# Patient Record
Sex: Female | Born: 1965
Health system: Southern US, Community
[De-identification: ages and names within clinical notes are randomized; demographics above are authoritative.]

## PROBLEM LIST (undated history)

## (undated) DIAGNOSIS — D649 Anemia, unspecified: Secondary | ICD-10-CM

## (undated) DIAGNOSIS — C50912 Malignant neoplasm of unspecified site of left female breast: Secondary | ICD-10-CM

## (undated) DIAGNOSIS — M3214 Glomerular disease in systemic lupus erythematosus: Secondary | ICD-10-CM

## (undated) DIAGNOSIS — K08109 Complete loss of teeth, unspecified cause, unspecified class: Secondary | ICD-10-CM

## (undated) DIAGNOSIS — E559 Vitamin D deficiency, unspecified: Secondary | ICD-10-CM

## (undated) DIAGNOSIS — I509 Heart failure, unspecified: Secondary | ICD-10-CM

## (undated) DIAGNOSIS — N189 Chronic kidney disease, unspecified: Secondary | ICD-10-CM

## (undated) DIAGNOSIS — E669 Obesity, unspecified: Secondary | ICD-10-CM

## (undated) DIAGNOSIS — R87619 Unspecified abnormal cytological findings in specimens from cervix uteri: Secondary | ICD-10-CM

## (undated) DIAGNOSIS — S92354A Nondisplaced fracture of fifth metatarsal bone, right foot, initial encounter for closed fracture: Secondary | ICD-10-CM

## (undated) DIAGNOSIS — Z923 Personal history of irradiation: Secondary | ICD-10-CM

## (undated) DIAGNOSIS — R809 Proteinuria, unspecified: Secondary | ICD-10-CM

## (undated) DIAGNOSIS — IMO0002 Reserved for concepts with insufficient information to code with codable children: Secondary | ICD-10-CM

## (undated) DIAGNOSIS — I10 Essential (primary) hypertension: Secondary | ICD-10-CM

## (undated) DIAGNOSIS — Z972 Presence of dental prosthetic device (complete) (partial): Secondary | ICD-10-CM

## (undated) DIAGNOSIS — Z9221 Personal history of antineoplastic chemotherapy: Secondary | ICD-10-CM

## (undated) HISTORY — DX: Proteinuria, unspecified: R80.9

## (undated) HISTORY — DX: Reserved for concepts with insufficient information to code with codable children: IMO0002

## (undated) HISTORY — DX: Complete loss of teeth, unspecified cause, unspecified class: K08.109

## (undated) HISTORY — DX: Unspecified abnormal cytological findings in specimens from cervix uteri: R87.619

## (undated) HISTORY — DX: Malignant neoplasm of unspecified site of left female breast: C50.912

## (undated) HISTORY — DX: Obesity, unspecified: E66.9

## (undated) HISTORY — DX: Essential (primary) hypertension: I10

## (undated) HISTORY — DX: Heart failure, unspecified: I50.9

## (undated) HISTORY — DX: Glomerular disease in systemic lupus erythematosus: M32.14

## (undated) HISTORY — DX: Anemia, unspecified: D64.9

## (undated) HISTORY — DX: Presence of dental prosthetic device (complete) (partial): Z97.2

## (undated) HISTORY — DX: Chronic kidney disease, unspecified: N18.9

## (undated) HISTORY — DX: Vitamin D deficiency, unspecified: E55.9

## (undated) HISTORY — DX: Nondisplaced fracture of fifth metatarsal bone, right foot, initial encounter for closed fracture: S92.354A

## (undated) SURGERY — BRONCHOSCOPY, WITH FLUOROSCOPY
Anesthesia: Moderate Sedation | Laterality: Bilateral

---

## 1985-10-19 HISTORY — PX: ANKLE SURGERY: SHX546

## 1998-10-19 HISTORY — PX: TUBAL LIGATION: SHX77

## 2006-10-14 ENCOUNTER — Emergency Department: Payer: Self-pay | Admitting: Emergency Medicine

## 2007-10-20 HISTORY — PX: MASTECTOMY: SHX3

## 2007-12-18 DIAGNOSIS — C50912 Malignant neoplasm of unspecified site of left female breast: Secondary | ICD-10-CM

## 2007-12-18 HISTORY — DX: Malignant neoplasm of unspecified site of left female breast: C50.912

## 2007-12-19 ENCOUNTER — Encounter (INDEPENDENT_AMBULATORY_CARE_PROVIDER_SITE_OTHER): Payer: Self-pay | Admitting: Gynecology

## 2007-12-19 ENCOUNTER — Ambulatory Visit: Payer: Self-pay | Admitting: Gynecology

## 2007-12-23 ENCOUNTER — Encounter (INDEPENDENT_AMBULATORY_CARE_PROVIDER_SITE_OTHER): Payer: Self-pay | Admitting: Diagnostic Radiology

## 2007-12-23 ENCOUNTER — Encounter: Admission: RE | Admit: 2007-12-23 | Discharge: 2007-12-23 | Payer: Self-pay | Admitting: Gynecology

## 2007-12-30 ENCOUNTER — Ambulatory Visit: Payer: Self-pay | Admitting: Oncology

## 2008-01-02 ENCOUNTER — Encounter: Admission: RE | Admit: 2008-01-02 | Discharge: 2008-01-02 | Payer: Self-pay | Admitting: Gynecology

## 2008-01-06 ENCOUNTER — Encounter: Admission: RE | Admit: 2008-01-06 | Discharge: 2008-01-06 | Payer: Self-pay | Admitting: Gynecology

## 2008-01-12 ENCOUNTER — Ambulatory Visit (HOSPITAL_COMMUNITY): Admission: RE | Admit: 2008-01-12 | Discharge: 2008-01-12 | Payer: Self-pay | Admitting: Oncology

## 2008-01-17 ENCOUNTER — Ambulatory Visit (HOSPITAL_COMMUNITY): Admission: RE | Admit: 2008-01-17 | Discharge: 2008-01-17 | Payer: Self-pay | Admitting: Oncology

## 2008-01-19 ENCOUNTER — Ambulatory Visit (HOSPITAL_BASED_OUTPATIENT_CLINIC_OR_DEPARTMENT_OTHER): Admission: RE | Admit: 2008-01-19 | Discharge: 2008-01-19 | Payer: Self-pay | Admitting: General Surgery

## 2008-01-21 LAB — HIV ANTIBODY (ROUTINE TESTING W REFLEX)

## 2008-02-07 ENCOUNTER — Encounter: Payer: Self-pay | Admitting: Oncology

## 2008-02-07 ENCOUNTER — Ambulatory Visit: Payer: Self-pay

## 2008-02-10 ENCOUNTER — Ambulatory Visit: Payer: Self-pay | Admitting: Oncology

## 2008-02-10 LAB — CBC WITH DIFFERENTIAL/PLATELET
Basophils Absolute: 0 10*3/uL (ref 0.0–0.1)
Eosinophils Absolute: 0 10*3/uL (ref 0.0–0.5)
HCT: 25.4 % — ABNORMAL LOW (ref 34.8–46.6)
HGB: 8.3 g/dL — ABNORMAL LOW (ref 11.6–15.9)
MONO#: 0.3 10*3/uL (ref 0.1–0.9)
NEUT%: 93.6 % — ABNORMAL HIGH (ref 39.6–76.8)
WBC: 11 10*3/uL — ABNORMAL HIGH (ref 3.9–10.0)
lymph#: 0.4 10*3/uL — ABNORMAL LOW (ref 0.9–3.3)

## 2008-02-17 LAB — CBC WITH DIFFERENTIAL/PLATELET
Basophils Absolute: 0.3 10*3/uL — ABNORMAL HIGH (ref 0.0–0.1)
HCT: 23.9 % — ABNORMAL LOW (ref 34.8–46.6)
HGB: 7.8 g/dL — ABNORMAL LOW (ref 11.6–15.9)
MCH: 22.4 pg — ABNORMAL LOW (ref 26.0–34.0)
MONO#: 0.8 10*3/uL (ref 0.1–0.9)
NEUT%: 70.2 % (ref 39.6–76.8)
WBC: 6.9 10*3/uL (ref 3.9–10.0)
lymph#: 0.9 10*3/uL (ref 0.9–3.3)

## 2008-02-24 LAB — CBC WITH DIFFERENTIAL/PLATELET
Basophils Absolute: 0 10*3/uL (ref 0.0–0.1)
Eosinophils Absolute: 0 10*3/uL (ref 0.0–0.5)
HCT: 23.1 % — ABNORMAL LOW (ref 34.8–46.6)
HGB: 7.8 g/dL — ABNORMAL LOW (ref 11.6–15.9)
LYMPH%: 14.1 % (ref 14.0–48.0)
MONO#: 0.4 10*3/uL (ref 0.1–0.9)
NEUT#: 6.1 10*3/uL (ref 1.5–6.5)
Platelets: 186 10*3/uL (ref 145–400)
RBC: 3.24 10*6/uL — ABNORMAL LOW (ref 3.70–5.32)
WBC: 7.5 10*3/uL (ref 3.9–10.0)

## 2008-03-02 LAB — CBC WITH DIFFERENTIAL/PLATELET
BASO%: 0.1 % (ref 0.0–2.0)
Eosinophils Absolute: 0 10*3/uL (ref 0.0–0.5)
HCT: 26.3 % — ABNORMAL LOW (ref 34.8–46.6)
LYMPH%: 4.2 % — ABNORMAL LOW (ref 14.0–48.0)
MCHC: 32.4 g/dL (ref 32.0–36.0)
MCV: 74.1 fL — ABNORMAL LOW (ref 81.0–101.0)
MONO#: 0.3 10*3/uL (ref 0.1–0.9)
NEUT%: 92.9 % — ABNORMAL HIGH (ref 39.6–76.8)
Platelets: 349 10*3/uL (ref 145–400)
WBC: 12.6 10*3/uL — ABNORMAL HIGH (ref 3.9–10.0)

## 2008-03-02 LAB — COMPREHENSIVE METABOLIC PANEL
CO2: 22 mEq/L (ref 19–32)
Creatinine, Ser: 0.79 mg/dL (ref 0.40–1.20)
Glucose, Bld: 149 mg/dL — ABNORMAL HIGH (ref 70–99)
Total Bilirubin: 0.4 mg/dL (ref 0.3–1.2)

## 2008-03-21 ENCOUNTER — Ambulatory Visit: Payer: Self-pay | Admitting: Oncology

## 2008-03-23 ENCOUNTER — Encounter: Payer: Self-pay | Admitting: Internal Medicine

## 2008-03-23 LAB — CBC WITH DIFFERENTIAL/PLATELET
Basophils Absolute: 0 10*3/uL (ref 0.0–0.1)
Eosinophils Absolute: 0 10*3/uL (ref 0.0–0.5)
LYMPH%: 7.2 % — ABNORMAL LOW (ref 14.0–48.0)
MCH: 26 pg (ref 26.0–34.0)
MCHC: 34.1 g/dL (ref 32.0–36.0)
MONO#: 0.2 10*3/uL (ref 0.1–0.9)
MONO%: 3.1 % (ref 0.0–13.0)
NEUT%: 89.7 % — ABNORMAL HIGH (ref 39.6–76.8)
RBC: 3.29 10*6/uL — ABNORMAL LOW (ref 3.70–5.32)
RDW: 26.5 % — ABNORMAL HIGH (ref 11.3–14.5)

## 2008-03-27 LAB — HEMOGLOBINOPATHY EVALUATION: Hemoglobin Other: 0 % (ref 0.0–0.0)

## 2008-03-27 LAB — LACTATE DEHYDROGENASE: LDH: 275 U/L — ABNORMAL HIGH (ref 94–250)

## 2008-03-29 LAB — CBC WITH DIFFERENTIAL/PLATELET
BASO%: 2.1 % — ABNORMAL HIGH (ref 0.0–2.0)
Basophils Absolute: 0.1 10*3/uL (ref 0.0–0.1)
Eosinophils Absolute: 0.1 10*3/uL (ref 0.0–0.5)
HCT: 26.5 % — ABNORMAL LOW (ref 34.8–46.6)
HGB: 8.7 g/dL — ABNORMAL LOW (ref 11.6–15.9)
MONO#: 0.5 10*3/uL (ref 0.1–0.9)
NEUT#: 2.6 10*3/uL (ref 1.5–6.5)
NEUT%: 60.5 % (ref 39.6–76.8)
Platelets: 149 10*3/uL (ref 145–400)
WBC: 4.3 10*3/uL (ref 3.9–10.0)
lymph#: 1 10*3/uL (ref 0.9–3.3)

## 2008-04-13 LAB — CBC WITH DIFFERENTIAL/PLATELET
BASO%: 0.2 % (ref 0.0–2.0)
Eosinophils Absolute: 0 10*3/uL (ref 0.0–0.5)
HCT: 26.7 % — ABNORMAL LOW (ref 34.8–46.6)
MCHC: 33.1 g/dL (ref 32.0–36.0)
MONO#: 0.3 10*3/uL (ref 0.1–0.9)
NEUT#: 8.8 10*3/uL — ABNORMAL HIGH (ref 1.5–6.5)
NEUT%: 90.9 % — ABNORMAL HIGH (ref 39.6–76.8)
WBC: 9.7 10*3/uL (ref 3.9–10.0)
lymph#: 0.5 10*3/uL — ABNORMAL LOW (ref 0.9–3.3)

## 2008-04-13 LAB — COMPREHENSIVE METABOLIC PANEL
ALT: 14 U/L (ref 0–35)
BUN: 16 mg/dL (ref 6–23)
CO2: 22 mEq/L (ref 19–32)
Calcium: 9.3 mg/dL (ref 8.4–10.5)
Chloride: 103 mEq/L (ref 96–112)
Creatinine, Ser: 0.74 mg/dL (ref 0.40–1.20)
Glucose, Bld: 150 mg/dL — ABNORMAL HIGH (ref 70–99)

## 2008-04-23 ENCOUNTER — Encounter: Admission: RE | Admit: 2008-04-23 | Discharge: 2008-04-23 | Payer: Self-pay | Admitting: Oncology

## 2008-04-23 LAB — CBC WITH DIFFERENTIAL/PLATELET
BASO%: 0.8 % (ref 0.0–2.0)
HCT: 26 % — ABNORMAL LOW (ref 34.8–46.6)
LYMPH%: 5.6 % — ABNORMAL LOW (ref 14.0–48.0)
MCHC: 33.2 g/dL (ref 32.0–36.0)
MCV: 80.4 fL — ABNORMAL LOW (ref 81.0–101.0)
MONO#: 0.9 10*3/uL (ref 0.1–0.9)
MONO%: 3.9 % (ref 0.0–13.0)
NEUT%: 89.7 % — ABNORMAL HIGH (ref 39.6–76.8)
Platelets: 219 10*3/uL (ref 145–400)
WBC: 22.5 10*3/uL — ABNORMAL HIGH (ref 3.9–10.0)

## 2008-05-08 ENCOUNTER — Ambulatory Visit: Payer: Self-pay | Admitting: Oncology

## 2008-05-11 LAB — CBC WITH DIFFERENTIAL/PLATELET
BASO%: 2.1 % — ABNORMAL HIGH (ref 0.0–2.0)
EOS%: 0.1 % (ref 0.0–7.0)
HCT: 24.8 % — ABNORMAL LOW (ref 34.8–46.6)
LYMPH%: 7.8 % — ABNORMAL LOW (ref 14.0–48.0)
MCH: 27.1 pg (ref 26.0–34.0)
MCHC: 33.2 g/dL (ref 32.0–36.0)
NEUT%: 81.4 % — ABNORMAL HIGH (ref 39.6–76.8)
Platelets: 268 10*3/uL (ref 145–400)

## 2008-05-16 ENCOUNTER — Encounter: Admission: RE | Admit: 2008-05-16 | Discharge: 2008-05-16 | Payer: Self-pay | Admitting: Oncology

## 2008-05-25 LAB — CBC WITH DIFFERENTIAL/PLATELET
BASO%: 1.6 % (ref 0.0–2.0)
EOS%: 0.2 % (ref 0.0–7.0)
MCH: 28.1 pg (ref 26.0–34.0)
MCHC: 33.3 g/dL (ref 32.0–36.0)
MCV: 84.4 fL (ref 81.0–101.0)
MONO%: 6.6 % (ref 0.0–13.0)
RDW: 20.4 % — ABNORMAL HIGH (ref 11.3–14.5)
lymph#: 0.7 10*3/uL — ABNORMAL LOW (ref 0.9–3.3)

## 2008-06-01 LAB — CBC WITH DIFFERENTIAL/PLATELET
BASO%: 2 % (ref 0.0–2.0)
Basophils Absolute: 0 10*3/uL (ref 0.0–0.1)
EOS%: 1.5 % (ref 0.0–7.0)
Eosinophils Absolute: 0 10*3/uL (ref 0.0–0.5)
HCT: 22 % — ABNORMAL LOW (ref 34.8–46.6)
HGB: 7.3 g/dL — ABNORMAL LOW (ref 11.6–15.9)
LYMPH%: 15.9 % (ref 14.0–48.0)
MCH: 28.3 pg (ref 26.0–34.0)
MCHC: 33.4 g/dL (ref 32.0–36.0)
MCV: 84.9 fL (ref 81.0–101.0)
MONO#: 0.1 10*3/uL (ref 0.1–0.9)
MONO%: 7.8 % (ref 0.0–13.0)
NEUT#: 0.6 10*3/uL — ABNORMAL LOW (ref 1.5–6.5)
NEUT%: 72.7 % (ref 39.6–76.8)
Platelets: 117 10*3/uL — ABNORMAL LOW (ref 145–400)
RBC: 2.59 10*6/uL — ABNORMAL LOW (ref 3.70–5.32)
RDW: 19.1 % — ABNORMAL HIGH (ref 11.3–14.5)
WBC: 0.8 10*3/uL — CL (ref 3.9–10.0)
lymph#: 0.1 10*3/uL — ABNORMAL LOW (ref 0.9–3.3)

## 2008-06-08 LAB — CBC WITH DIFFERENTIAL/PLATELET
Basophils Absolute: 0.2 10*3/uL — ABNORMAL HIGH (ref 0.0–0.1)
Eosinophils Absolute: 0 10*3/uL (ref 0.0–0.5)
HGB: 7.3 g/dL — ABNORMAL LOW (ref 11.6–15.9)
MCV: 85.4 fL (ref 81.0–101.0)
MONO#: 1.1 10*3/uL — ABNORMAL HIGH (ref 0.1–0.9)
NEUT#: 10.2 10*3/uL — ABNORMAL HIGH (ref 1.5–6.5)
RBC: 2.6 10*6/uL — ABNORMAL LOW (ref 3.70–5.32)
RDW: 20.2 % — ABNORMAL HIGH (ref 11.3–14.5)
WBC: 12 10*3/uL — ABNORMAL HIGH (ref 3.9–10.0)
lymph#: 0.5 10*3/uL — ABNORMAL LOW (ref 0.9–3.3)

## 2008-06-14 ENCOUNTER — Ambulatory Visit: Payer: Self-pay | Admitting: Oncology

## 2008-06-18 LAB — CBC WITH DIFFERENTIAL/PLATELET
Basophils Absolute: 0.1 10*3/uL (ref 0.0–0.1)
Eosinophils Absolute: 0 10*3/uL (ref 0.0–0.5)
HCT: 20.4 % — ABNORMAL LOW (ref 34.8–46.6)
HGB: 6.9 g/dL — CL (ref 11.6–15.9)
LYMPH%: 6 % — ABNORMAL LOW (ref 14.0–48.0)
MCV: 85 fL (ref 81.0–101.0)
MONO#: 0.6 10*3/uL (ref 0.1–0.9)
NEUT#: 2.4 10*3/uL (ref 1.5–6.5)
NEUT%: 73.8 % (ref 39.6–76.8)
Platelets: 65 10*3/uL — ABNORMAL LOW (ref 145–400)
RBC: 2.4 10*6/uL — ABNORMAL LOW (ref 3.70–5.32)
WBC: 3.3 10*3/uL — ABNORMAL LOW (ref 3.9–10.0)

## 2008-06-27 ENCOUNTER — Ambulatory Visit: Payer: Self-pay | Admitting: Internal Medicine

## 2008-06-27 DIAGNOSIS — I10 Essential (primary) hypertension: Secondary | ICD-10-CM | POA: Insufficient documentation

## 2008-06-27 LAB — CBC & DIFF AND RETIC
BASO%: 0.2 % (ref 0.0–2.0)
EOS%: 0 % (ref 0.0–7.0)
HGB: 7.3 g/dL — ABNORMAL LOW (ref 11.6–15.9)
IRF: 0.57 — ABNORMAL HIGH (ref 0.130–0.330)
MCH: 30 pg (ref 26.0–34.0)
MCHC: 34.2 g/dL (ref 32.0–36.0)
MCV: 87.7 fL (ref 81.0–101.0)
MONO%: 19.8 % — ABNORMAL HIGH (ref 0.0–13.0)
RBC: 2.45 10*6/uL — ABNORMAL LOW (ref 3.70–5.32)
RDW: 22.1 % — ABNORMAL HIGH (ref 11.3–14.5)
RETIC #: 105.6 10*3/uL (ref 19.7–115.1)
Retic %: 4.3 % — ABNORMAL HIGH (ref 0.4–2.3)
lymph#: 0.3 10*3/uL — ABNORMAL LOW (ref 0.9–3.3)

## 2008-07-03 ENCOUNTER — Encounter: Admission: RE | Admit: 2008-07-03 | Discharge: 2008-07-03 | Payer: Self-pay | Admitting: General Surgery

## 2008-07-13 ENCOUNTER — Ambulatory Visit: Admission: RE | Admit: 2008-07-13 | Discharge: 2008-07-17 | Payer: Self-pay | Admitting: Radiation Oncology

## 2008-08-02 ENCOUNTER — Ambulatory Visit (HOSPITAL_COMMUNITY): Admission: RE | Admit: 2008-08-02 | Discharge: 2008-08-03 | Payer: Self-pay | Admitting: General Surgery

## 2008-08-02 ENCOUNTER — Encounter (HOSPITAL_BASED_OUTPATIENT_CLINIC_OR_DEPARTMENT_OTHER): Payer: Self-pay | Admitting: General Surgery

## 2008-08-03 ENCOUNTER — Ambulatory Visit: Payer: Self-pay | Admitting: Oncology

## 2008-08-10 ENCOUNTER — Ambulatory Visit: Admission: RE | Admit: 2008-08-10 | Discharge: 2008-09-24 | Payer: Self-pay | Admitting: Radiation Oncology

## 2008-09-18 ENCOUNTER — Ambulatory Visit (HOSPITAL_COMMUNITY): Admission: RE | Admit: 2008-09-18 | Discharge: 2008-09-19 | Payer: Self-pay | Admitting: General Surgery

## 2008-09-18 ENCOUNTER — Encounter (HOSPITAL_BASED_OUTPATIENT_CLINIC_OR_DEPARTMENT_OTHER): Payer: Self-pay | Admitting: General Surgery

## 2008-10-10 ENCOUNTER — Ambulatory Visit: Payer: Self-pay | Admitting: Oncology

## 2008-10-18 ENCOUNTER — Ambulatory Visit: Admission: RE | Admit: 2008-10-18 | Discharge: 2009-01-04 | Payer: Self-pay | Admitting: Radiation Oncology

## 2008-12-11 ENCOUNTER — Ambulatory Visit: Payer: Self-pay | Admitting: Oncology

## 2008-12-13 LAB — CBC WITH DIFFERENTIAL/PLATELET
BASO%: 0.4 % (ref 0.0–2.0)
EOS%: 0.4 % (ref 0.0–7.0)
LYMPH%: 8.3 % — ABNORMAL LOW (ref 14.0–49.7)
MCH: 24.1 pg — ABNORMAL LOW (ref 25.1–34.0)
MCHC: 32.9 g/dL (ref 31.5–36.0)
MONO#: 0.4 10*3/uL (ref 0.1–0.9)
MONO%: 13.9 % (ref 0.0–14.0)
Platelets: 216 10*3/uL (ref 145–400)
RBC: 4.1 10*6/uL (ref 3.70–5.45)
WBC: 2.7 10*3/uL — ABNORMAL LOW (ref 3.9–10.3)
nRBC: 0 % (ref 0–0)

## 2009-03-07 ENCOUNTER — Ambulatory Visit: Payer: Self-pay | Admitting: Obstetrics & Gynecology

## 2009-03-07 ENCOUNTER — Encounter: Payer: Self-pay | Admitting: Obstetrics & Gynecology

## 2009-03-12 ENCOUNTER — Ambulatory Visit: Payer: Self-pay | Admitting: Oncology

## 2009-04-15 LAB — COMPREHENSIVE METABOLIC PANEL
Albumin: 3.2 g/dL — ABNORMAL LOW (ref 3.5–5.2)
Alkaline Phosphatase: 96 U/L (ref 39–117)
BUN: 37 mg/dL — ABNORMAL HIGH (ref 6–23)
CO2: 23 mEq/L (ref 19–32)
Calcium: 8.8 mg/dL (ref 8.4–10.5)
Chloride: 103 mEq/L (ref 96–112)
Glucose, Bld: 107 mg/dL — ABNORMAL HIGH (ref 70–99)
Potassium: 3.8 mEq/L (ref 3.5–5.3)
Sodium: 132 mEq/L — ABNORMAL LOW (ref 135–145)
Total Protein: 7.8 g/dL (ref 6.0–8.3)

## 2009-04-15 LAB — FERRITIN: Ferritin: 312 ng/mL — ABNORMAL HIGH (ref 10–291)

## 2009-04-15 LAB — CBC & DIFF AND RETIC
Basophils Absolute: 0 10*3/uL (ref 0.0–0.1)
Eosinophils Absolute: 0 10*3/uL (ref 0.0–0.5)
HCT: 26.9 % — ABNORMAL LOW (ref 34.8–46.6)
HGB: 9.6 g/dL — ABNORMAL LOW (ref 11.6–15.9)
LYMPH%: 20.1 % (ref 14.0–49.7)
MCV: 77.6 fL — ABNORMAL LOW (ref 79.5–101.0)
MONO#: 0.4 10*3/uL (ref 0.1–0.9)
MONO%: 12.6 % (ref 0.0–14.0)
NEUT#: 1.9 10*3/uL (ref 1.5–6.5)
NEUT%: 66.5 % (ref 38.4–76.8)
Platelets: 241 10*3/uL (ref 145–400)
RBC: 3.46 10*6/uL — ABNORMAL LOW (ref 3.70–5.45)
WBC: 2.8 10*3/uL — ABNORMAL LOW (ref 3.9–10.3)

## 2009-04-22 LAB — ESTRADIOL, ULTRA SENS

## 2009-04-25 ENCOUNTER — Encounter: Admission: RE | Admit: 2009-04-25 | Discharge: 2009-04-25 | Payer: Self-pay | Admitting: Oncology

## 2009-09-27 ENCOUNTER — Ambulatory Visit: Payer: Self-pay | Admitting: Oncology

## 2009-10-01 LAB — CBC WITH DIFFERENTIAL/PLATELET
BASO%: 0.2 % (ref 0.0–2.0)
EOS%: 1.1 % (ref 0.0–7.0)
Eosinophils Absolute: 0 10*3/uL (ref 0.0–0.5)
LYMPH%: 20.6 % (ref 14.0–49.7)
MCH: 29.5 pg (ref 25.1–34.0)
MCHC: 34.9 g/dL (ref 31.5–36.0)
MCV: 84.6 fL (ref 79.5–101.0)
MONO%: 9.6 % (ref 0.0–14.0)
Platelets: 260 10*3/uL (ref 145–400)
RBC: 2.92 10*6/uL — ABNORMAL LOW (ref 3.70–5.45)
RDW: 13 % (ref 11.2–14.5)

## 2009-10-02 LAB — COMPREHENSIVE METABOLIC PANEL
AST: 14 U/L (ref 0–37)
Alkaline Phosphatase: 91 U/L (ref 39–117)
Glucose, Bld: 80 mg/dL (ref 70–99)
Sodium: 139 mEq/L (ref 135–145)
Total Bilirubin: 0.4 mg/dL (ref 0.3–1.2)
Total Protein: 7.6 g/dL (ref 6.0–8.3)

## 2009-10-15 ENCOUNTER — Ambulatory Visit (HOSPITAL_COMMUNITY): Admission: RE | Admit: 2009-10-15 | Discharge: 2009-10-15 | Payer: Self-pay | Admitting: Oncology

## 2009-12-26 ENCOUNTER — Ambulatory Visit: Payer: Self-pay | Admitting: Oncology

## 2009-12-31 LAB — CBC & DIFF AND RETIC
BASO%: 0.2 % (ref 0.0–2.0)
EOS%: 2.2 % (ref 0.0–7.0)
Eosinophils Absolute: 0.1 10*3/uL (ref 0.0–0.5)
LYMPH%: 19.8 % (ref 14.0–49.7)
MCH: 27.7 pg (ref 25.1–34.0)
MCHC: 34.1 g/dL (ref 31.5–36.0)
MCV: 81.2 fL (ref 79.5–101.0)
MONO%: 7.5 % (ref 0.0–14.0)
NEUT#: 2.9 10*3/uL (ref 1.5–6.5)
RBC: 3.03 10*6/uL — ABNORMAL LOW (ref 3.70–5.45)
RDW: 13.1 % (ref 11.2–14.5)
Retic %: 1.14 % (ref 0.50–1.50)
Retic Ct Abs: 34.54 10*3/uL (ref 18.30–72.70)

## 2009-12-31 LAB — COMPREHENSIVE METABOLIC PANEL
AST: 12 U/L (ref 0–37)
Alkaline Phosphatase: 85 U/L (ref 39–117)
BUN: 34 mg/dL — ABNORMAL HIGH (ref 6–23)
Creatinine, Ser: 1.83 mg/dL — ABNORMAL HIGH (ref 0.40–1.20)
Glucose, Bld: 101 mg/dL — ABNORMAL HIGH (ref 70–99)
Total Bilirubin: 0.2 mg/dL — ABNORMAL LOW (ref 0.3–1.2)

## 2009-12-31 LAB — FERRITIN: Ferritin: 320 ng/mL — ABNORMAL HIGH (ref 10–291)

## 2009-12-31 LAB — FOLLICLE STIMULATING HORMONE: FSH: 43 m[IU]/mL

## 2009-12-31 LAB — FOLATE: Folate: 6.8 ng/mL

## 2010-01-06 ENCOUNTER — Encounter: Payer: Self-pay | Admitting: Internal Medicine

## 2010-01-07 LAB — ESTRADIOL, ULTRA SENS: Estradiol, Ultra Sensitive: 16 pg/mL

## 2010-01-08 ENCOUNTER — Ambulatory Visit (HOSPITAL_COMMUNITY): Admission: RE | Admit: 2010-01-08 | Discharge: 2010-01-08 | Payer: Self-pay | Admitting: Oncology

## 2010-02-28 ENCOUNTER — Ambulatory Visit: Payer: Self-pay | Admitting: Oncology

## 2010-02-28 LAB — BASIC METABOLIC PANEL
BUN: 36 mg/dL — ABNORMAL HIGH (ref 6–23)
Chloride: 109 mEq/L (ref 96–112)
Creatinine, Ser: 2.35 mg/dL — ABNORMAL HIGH (ref 0.40–1.20)
Glucose, Bld: 90 mg/dL (ref 70–99)

## 2010-04-30 ENCOUNTER — Encounter: Admission: RE | Admit: 2010-04-30 | Discharge: 2010-04-30 | Payer: Self-pay | Admitting: Oncology

## 2010-05-06 ENCOUNTER — Ambulatory Visit: Payer: Self-pay | Admitting: Oncology

## 2010-07-14 ENCOUNTER — Ambulatory Visit: Payer: Self-pay | Admitting: Oncology

## 2010-07-16 ENCOUNTER — Encounter: Payer: Self-pay | Admitting: Internal Medicine

## 2010-07-16 LAB — CBC WITH DIFFERENTIAL/PLATELET
BASO%: 0.3 % (ref 0.0–2.0)
EOS%: 2 % (ref 0.0–7.0)
MCH: 27.3 pg (ref 25.1–34.0)
MCV: 78.1 fL — ABNORMAL LOW (ref 79.5–101.0)
MONO#: 0.3 10*3/uL (ref 0.1–0.9)
MONO%: 9.9 % (ref 0.0–14.0)
NEUT#: 1.9 10*3/uL (ref 1.5–6.5)
WBC: 2.8 10*3/uL — ABNORMAL LOW (ref 3.9–10.3)

## 2010-07-16 LAB — COMPREHENSIVE METABOLIC PANEL
ALT: 10 U/L (ref 0–35)
AST: 13 U/L (ref 0–37)
Alkaline Phosphatase: 73 U/L (ref 39–117)
Calcium: 8.5 mg/dL (ref 8.4–10.5)
Chloride: 107 mEq/L (ref 96–112)
Creatinine, Ser: 1.23 mg/dL — ABNORMAL HIGH (ref 0.40–1.20)
Potassium: 3.7 mEq/L (ref 3.5–5.3)

## 2010-10-10 ENCOUNTER — Ambulatory Visit: Payer: Self-pay | Admitting: Oncology

## 2010-10-15 LAB — MORPHOLOGY: PLT EST: ADEQUATE

## 2010-10-15 LAB — CBC & DIFF AND RETIC
EOS%: 2.6 % (ref 0.0–7.0)
MCH: 25.4 pg (ref 25.1–34.0)
MCV: 77.1 fL — ABNORMAL LOW (ref 79.5–101.0)
MONO%: 8.2 % (ref 0.0–14.0)
NEUT#: 2.2 10*3/uL (ref 1.5–6.5)
RBC: 4.01 10*6/uL (ref 3.70–5.45)
RDW: 14.7 % — ABNORMAL HIGH (ref 11.2–14.5)
Retic %: 1.01 % (ref 0.50–1.50)
nRBC: 0 % (ref 0–0)

## 2010-11-09 ENCOUNTER — Encounter: Payer: Self-pay | Admitting: Oncology

## 2010-11-09 ENCOUNTER — Encounter: Payer: Self-pay | Admitting: Gynecology

## 2010-11-18 NOTE — Letter (Signed)
Summary: [facility]   Imported By: Phillis Knack 07/29/2010 10:14:35  _____________________________________________________________________  External Attachment:    Type:   Image     Comment:   External Document

## 2010-11-18 NOTE — Letter (Signed)
Summary: Melanie Holloway   Imported By: Bubba Hales 01/22/2010 09:54:19  _____________________________________________________________________  External Attachment:    Type:   Image     Comment:   External Document

## 2010-11-19 ENCOUNTER — Encounter: Payer: Self-pay | Admitting: Internal Medicine

## 2010-12-10 NOTE — Letter (Signed)
Summary: [facility]   Imported By: Rise Patience 12/04/2010 09:37:15  _____________________________________________________________________  External Attachment:    Type:   Image     Comment:   External Document

## 2011-02-18 ENCOUNTER — Encounter (INDEPENDENT_AMBULATORY_CARE_PROVIDER_SITE_OTHER): Payer: Self-pay | Admitting: General Surgery

## 2011-02-19 ENCOUNTER — Ambulatory Visit (INDEPENDENT_AMBULATORY_CARE_PROVIDER_SITE_OTHER): Payer: BC Managed Care – PPO | Admitting: Family Medicine

## 2011-02-19 DIAGNOSIS — Z01419 Encounter for gynecological examination (general) (routine) without abnormal findings: Secondary | ICD-10-CM

## 2011-02-19 DIAGNOSIS — Z1272 Encounter for screening for malignant neoplasm of vagina: Secondary | ICD-10-CM

## 2011-02-19 DIAGNOSIS — I10 Essential (primary) hypertension: Secondary | ICD-10-CM

## 2011-02-19 DIAGNOSIS — E669 Obesity, unspecified: Secondary | ICD-10-CM

## 2011-02-20 NOTE — Assessment & Plan Note (Signed)
NAMEJENTRIE, Melanie Holloway NO.:  [id number]  MEDICAL RECORD NO.:  [id number]           PATIENT TYPE:  LOCATION:  [place] at [place]:  PHYSICIAN:  Darron Doom, MD             DATE OF BIRTH:  DATE OF SERVICE:  02/19/2011                                 CLINIC NOTE  CHIEF COMPLAINT:  Yearly exam and Pap smear.  HISTORY OF PRESENT ILLNESS:  The patient is a 45 year old gravida 3, para 3 who has previously had been seen in this office.  Her last normal exam was in May 2010.  She has a history of breast cancer and she is followed by Dr. Jana Hakim and Dr. Donne Hazel and the radiation oncologist every 6 months for exams, repeat mammogram.  Her last mammogram was in the end of 2011 and was normal.  She is otherwise without specific complaint today.  PAST MEDICAL HISTORY:  Significant for hypertension and history of breast cancer.  PAST SURGICAL HISTORY:  She has had a BTL and a left mastectomy.  She had a left ankle ORIF.  MEDICATIONS:  Include tamoxifen and lisinopril 20 daily.  FAMILY HISTORY:  Breast cancer and diabetes in her dad.  GYN HISTORY:  Shows a history of abnormal Pap approximately 7 years ago. No other GYN problems.  She has not had her cycle since 2009.  ALLERGIES:  None known.  OBSTETRICAL HISTORY:  She had 3 vaginal deliveries.  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  No tobacco, alcohol or drug use.  REVIEW OF SYSTEMS:  A 14-point review of systems is reviewed and is positive for cough which has been going on for some time.  PHYSICAL EXAMINATION:  VITAL SIGNS:  Her blood pressure is 147/97, her weight is 197, she is 4 feet 11 feet. GENERAL:  She is a well-developed, well-nourished female in no acute distress. HEENT:  Normocephalic, atraumatic.  Sclerae anicteric. NECK:  Supple.  Normal thyroid. LUNGS:  Clear bilaterally. CV:  Regular rate and rhythm.  No rubs,  gallops, murmurs. ABDOMEN:  Soft, nontender, nondistended. BREASTS:  Deferred because she sees several other doctors who examined this area. ABDOMEN:  Soft, nontender, nondistended. GU:  Normal external female genitalia.  BUS normal.  Vagina is pale with loss of irrigation.  Cervix is closed.  Uterus and adnexa are difficult to palpate given body habitus. EXTREMITIES:  No cyanosis, clubbing.  She has 1+ distal edema.  ASSESSMENT:  Gynecological exam with Pap.  PLAN: 1. Pap smear today. 2. Referred her back to her primary care physician for improved blood     pressure control. 3. Also discussed with her primary care about lisinopril and its     propensity to cause cough. 4. She will continue to follow with Dr. Jana Hakim and Dr. Donne Hazel for     breast related issues. 5. Discussed weight loss with the patient and certain things she can     do that may help her to achieve her weight loss goals.  Approximately 10 minutes was spent discussing with this patient.  ______________________________ Darron Doom, MD    TP/MEDQ  D:  02/19/2011  T:  02/20/2011  Job:  [id number]

## 2011-03-03 NOTE — Op Note (Signed)
Melanie Holloway, RAMSDELL                ACCOUNT NO.:  [id number]   MEDICAL RECORD NO.:  [id number]          PATIENT TYPE:  OIB   LOCATION:  5151                         FACILITY:  [facility]   PHYSICIAN:  Kathrin Penner, M.D.   DATE OF BIRTH:  06/08/1966   DATE OF PROCEDURE:  08/02/2008  DATE OF DISCHARGE:                               OPERATIVE REPORT   PREOPERATIVE DIAGNOSIS:  Carcinoma of left breast status post  neoadjuvant chemotherapy.   POSTOPERATIVE DIAGNOSIS:  Carcinoma of left breast status post  neoadjuvant chemotherapy.   PROCEDURE:  Left partial mastectomy and left axillary lymph node  dissection.   SURGEON:  Kathrin Penner, MD   ASSISTANT:  Orson Ape. Weatherly, MD   ANESTHESIA:  General.   SPECIMENS TO LAB:  Left breast tissue and left axillary content.   ESTIMATED BLOOD LOSS:  Minimal.   COMPLICATIONS:  None.  The patient returned to the PACU in excellent  condition.   The patient is a 45 year old female presenting originally with an  approximately 9 x 7 x 7-cm tumor of the left breast which on biopsy  shows this to be an infiltrating and intraductal carcinoma.  She also  had very large lymph nodes on MRI.  On biopsies, these also were  positive.  The patient underwent neoadjuvant chemotherapy with  significant shrinkage of the tumor down to 8 x 2.5 x 2.5.  The patient  was initially offered modified radical mastectomy.  However, she prefers  to undergo a left-sided partial mastectomy and axillary lymph node  dissection and she is here today for that treatment.   The patient is fully aware of the risks and potential benefits of  surgery including the risk of local recurrence and/or inadequate tumor  margin.  She accepts this and gives consent.   PROCEDURE:  The patient was positioned supinely.  Following induction of  satisfactory general anesthesia, the left breast and axilla are prepped  and draped to be included in the sterile operative field.  The region of  the palpated mass is surrounded by a large elliptical incision in the  inferior lateral portion of the breast.  This was deepened through the  skin and subcutaneous tissues, raising superior, inferior, medial, and  lateral flaps.  The entire dissection was carried down all the way to  the chest wall and inclusive large wedge of breast tissue was removed in  its entirety and forwarded for pathologic evaluation.  I marked the  medial margin with a long suture and the inferior margin with a double  suture.  Hemostasis was assured within the breast wound and attention  then turned to the axilla.   An axillary incision extending from the edge of the pectoralis major  muscle down to the anterior border of the latissimus dorsi is carried  out, deepened this through skin and subcutaneous tissues.  Dissection  then carried up into the axilla under the pectoralis major muscle where  the axillary vein was located.  Axillary lymph node dissection was  carried out from [facility] ligament all the way down to the latissimus  dorsi.  The longus thoracic and thoracodorsal nerves were spared during  the course of the dissection.  The left axillary lymph node contents  were removed in their entirety and forwarded for pathologic evaluation.  The left axilla was then thoroughly irrigated with normal saline.  Sponge and instrument counts were verified.  I placed a 19-French Blake  drain into the axilla for drainage.  This was secured to the skin with a  4-0 nylon.  The subcutaneous tissue of the axilla was then closed with  interrupted 2-0 Vicryl suture.  The skin closed with running 4-0  Monocryl suture.  Attention again turned to the breast wound where all  areas of dissection within the breast were checked for hemostasis.  Additional bleeding points were treated electrocautery.  Subcutaneous  tissues were then closed with interrupted 2-0 Vicryl sutures.  Skin was  closed with running 4-0 Monocryl suture.   Sterile dressings were placed  on the incisions, the anesthetic reversed, and the patient removed from  the operating room to the recovery room in stable condition.  She  tolerated the procedure well.      Kathrin Penner, M.D.  Electronically Signed     PB/MEDQ  D:  08/02/2008  T:  08/02/2008  Job:  [id number]

## 2011-03-03 NOTE — Op Note (Signed)
NAMEALEXANDERIA, OBAS                ACCOUNT NO.:  [id number]   MEDICAL RECORD NO.:  [id number]          PATIENT TYPE:  AMB   LOCATION:  [facility]SC                          FACILITY:  [facility]   PHYSICIAN:  Kathrin Penner, M.D.   DATE OF BIRTH:  10/16/1966   DATE OF PROCEDURE:  01/19/2008  DATE OF DISCHARGE:                               OPERATIVE REPORT   PREOPERATIVE DIAGNOSES:  Poor venous access.   POSTOPERATIVE DIAGNOSES:  Poor venous access.   PROCEDURE:  Port-A-Cath implantation right subclavian vein.   SURGEON:  Kathrin Penner, M.D.   ASSISTANT:  OR nurse.   ANESTHESIA:  MAC. I used 0.25% Marcaine with epinephrine and 1%  lidocaine plain.   SPECIMENS:  There were no specimens sent for the lab.   ESTIMATED BLOOD LOSS:  Minimal.   There were no complications during the course of the operation.   The patient is a 45 year old female presenting with a large left-sided  breast cancer which will require neoadjuvant chemotherapy for shrinkage  prior to definitive surgery. She comes to the operating room now for  placement of a Port-A-Cath so as to facilitate her chemotherapeutic  regimen. The patient is well aware of the risks and potential benefits  of surgery and comes to the operating room now having consented to this  procedure.   DESCRIPTION OF PROCEDURE:  The patient was positioned supinely on the  operating room table and then placed in the Trendelenburg position  because of her body habitus. The right breast had to be retracted  inferiorly and taped to the bed so as to allow access to her subclavian  region. The patient's anterior chest and neck are prepped and draped to  be included in the sterile operative field. Positive identification of  the patient as Melanie Holloway and the operation to be done as Port-A-Cath  implantation was carried out. I infiltrated the right subclavian region  with 1% lidocaine plain and then I made a subclavian stick into the  right subclavian  vein. I then threaded a guidewire into the central  venous system and confirmed its position fluoroscopically. I then  created a pocket on the anterior chest wall fairly close to the sternum  so as to allow access to the Port-A-Cath. A pocket having been created,  I made a tunnel up to the shoulder wound and brought the Silastic  catheter from the shoulder down into the pocket. The Silastic catheter  and the reservoir were then thoroughly flushed. I used the Seldinger-  type dilator and introducer and placed this into the central venous  system and confirmed its position on fluoroscopy. I then removed the  dilator and the guidewire and inserted the catheter into the central  venous system and using fluoroscopy positioned the tip of the Silastic  catheter at the vena cava atrial junction. The portion of the Silastic  catheter that is now in the pocket was cut and securely attached to the  reservoir. The reservoir was then sewn into the pocket with 2-0 Prolene  sutures. Inflow of heparinized saline and blood return were noted to be  excellent from the Port-A-Cath. Sponge, instrument and sharp counts were  verified. The incision is closed in layers as follows:  The subcutaneous  tissues are closed with interrupted 3-0 Vicryl and skin closed with  running 4-0 Monocryl. Similarly the shoulder wound was closed with 3-0  Vicryl and 4-0 Monocryl. Sterile dressings were applied to the wounds.  Final evaluation of the Port-A-Cath placement showed the Port-A-Cath to  be easily palpable. The course of the Silastic chewing was without  kinks, bends or unusual turns. The tip of the Silastic catheter was at  the atrial vena cava junction. This having been accomplished, I injected  concentrated heparinized saline into the Port-A-Cath. Following the  application of sterile dressings, the patient was removed from the  operating room to the recovery room in stable condition. She tolerated  the procedure  well.      Kathrin Penner, M.D.  Electronically Signed     PB/MEDQ  D:  01/19/2008  T:  01/19/2008  Job:  [id number]

## 2011-03-03 NOTE — Op Note (Signed)
Melanie Holloway, Melanie Holloway                ACCOUNT NO.:  [id number]   MEDICAL RECORD NO.:  [id number]          PATIENT TYPE:  OIB   LOCATION:                               FACILITY:  [facility]   PHYSICIAN:  Kathrin Penner, M.D.   DATE OF BIRTH:  [birth year]   DATE OF PROCEDURE:  09/26/2008  DATE OF DISCHARGE:                               OPERATIVE REPORT   PREOPERATIVE DIAGNOSIS:  Carcinoma of the left breast.   POSTOPERATIVE DIAGNOSIS:  Carcinoma of the left breast.   PROCEDURES:  Left total mastectomy.   SURGEON:  Kathrin Penner, MD   ASSISTANT:  Orson Ape. Rise Patience, MD   ANESTHESIA:  General.   [name] is a 45 year old female presenting originally with a large  left-sided breast mass which somewhat was significantly reduced on  neoadjuvant chemotherapy.  Starting from a size of 6.7 down to  approximate size of 5 cm, she underwent left-sided partial mastectomy  with tumor resection seen at the chest wall margin within the skeletal  musculature.  On consultation with the Multid[facility] Breast  [facility], we decided to go ahead and do a left-sided total mastectomy on  this patient.  She had previously had a left-sided axillary lymph node  dissection.   The patient is aware of the risks and potential benefits of surgery and  she gives her consent to same.   PROCEDURE:  With the patient positioned supinely following induction of  satisfactory general anesthesia, the left breast and chest wall were  prepped and draped to be included in a sterile operative field.  An  elliptical incision extending from just lateral to the left sternal  border and carried laterally to the anterior border of the latissimus  dorsi was made and an ellipse inclusive of the entire breast and skin.  A superior medial flap was raised to the clavicle and to the sternal  border.  An inferolateral flap was raised down to the rectus muscle and  to the anterior border of the latissimus dorsi muscle.   Dissection  beginning medially was carried down, removing the breast tissue over the  pectoralis major and minor muscles and off the rectus abdominis muscles,  particularly in the area of previous resection.  The specimen was then  transected at the axilla and removed and forwarded for pathologic  evaluation.  All areas of dissection were checked for hemostasis.  Additional bleeding points were treated with electrocautery.  Sponge,  instrument, and sharp counts were verified.  Two 19-French Blake drains  were placed on the flaps for drainage and the wound was closed in layers  as follows:  Subcutaneous tissues were closed with interrupted 2-0  Vicryl sutures.  Skin was closed with running 4-0 Monocryl suture  reinforced with Steri-Strips.  The drains were  secured with 3-0 nylon sutures.  Sterile compressive dressing was  applied.  The anesthetic was reversed and the patient was removed from  the operating room to the recovery room in stable condition.  She  tolerated the procedure well.      Kathrin Penner, M.D.  Electronically Signed  PB/MEDQ  D:  10/02/2008  T:  10/02/2008  Job:  [id number]

## 2011-03-03 NOTE — Assessment & Plan Note (Signed)
NAMEKEYMANI, BROCKSCHMIDT                ACCOUNT NO.:  [id number]   MEDICAL RECORD NO.:  [id number]          PATIENT TYPE:  POB   LOCATION:  [place] [facility] [place]:  Silas Sacramento, MD      DATE OF BIRTH:  [birth year]   DATE OF SERVICE:  03/07/2009                                  CLINIC NOTE   HISTORY:  The patient is a 45 year old female who was last seen by [place] a  year ago for breast nodule.  We did send her for testing, it was found  that she did have breast cancer.  She underwent neoadjuvant chemotherapy  with Dr. Jana Hakim and then lumpectomy.  The margins were up against the  chest wall, so she eventually had a left total mastectomy and axillary  lymph node dissection.  Her last exam by Dr. Donne Hazel was March of this  year and he did not feel any lump.  The patient has no complaints today.   PAST MEDICAL HISTORY:  Newly diagnosed hypertension and newly diagnosed  breast cancer, on tamoxifen.   PAST SURGICAL HISTORY:  BTL and left mastectomy.   MEDICATIONS:  Tamoxifen and lisinopril.   FAMILY HISTORY:  There is breast cancer in her family and her parents  with diabetes.   GYNECOLOGIC HISTORY:  Has had abnormal Pap smear 5 years ago which was  just followed with repeat Pap smears.  No other gynecological problems  are reported.  The patient has been without periods since May 2009 after  chemotherapy.   SOCIAL HISTORY:  The patient does not smoke, drink, or do drugs.   SYSTEMIC REVIEW:  Negative.   ALLERGIES:  None.   PHYSICAL EXAMINATION:  VITAL SIGNS:  Pulse 87, blood pressure 157/104,  weight 190, height 4 feet 11 inches.  GENERAL:  Well nourished, well developed, in no apparent distress.  HEENT:  Normocephalic and atraumatic.  Full set of dentures, upper and  lower.  Thyroid, no masses.  LUNGS:  Clear to auscultation bilaterally.  HEART:  Regular rate and rhythm.  BREASTS:  Left breast absent with well healed scars.  No  lymphadenopathy.   Right breast, questionable thickening at 12 o'clock  above the nipple.  No discrete masses felt, it is slightly different in  this area.  This could questionably be postoperative changes.  I would  like to refer, but the patient does not agree to this point.  ABDOMEN:  Soft, obese, nontender.  No organomegaly.  No hernia.  GENITALIA:  Tanner V.  Vagina is pink, no discharge.  Cervix closed,  nontender.  Uterus and adnexa difficult to palpate, but nontender.  No  masses.  Bladder well supported.  Rectovaginal nodularity.  EXTREMITIES:  Nontender.   ASSESSMENT AND PLAN:  A 45 year old female for well-woman exam.  1. Pap smear.  2. I would like to refer the patient back to [facility] or [facility].      We will order a spot compression views of right breast.  The      patient refuses at this time.  She has a followup appointment with      Dr. Jana Hakim in June and we will  send our note to him, so he has      this and this will be the area that were slightly concerned up in      area at 12 o'clock.  3. Blood pressure is high today 157/104, but she just took her      medication.  Recheck her blood pressure today.  She was asked to      follow up with Dr. Jana Hakim for hypertension.  4. Calcium supplementation.  5. Return to clinic in 1 year.           ______________________________  Silas Sacramento, MD     KL/MEDQ  D:  03/07/2009  T:  03/07/2009  Job:  [id number]

## 2011-03-30 ENCOUNTER — Other Ambulatory Visit: Payer: Self-pay | Admitting: Oncology

## 2011-03-30 DIAGNOSIS — Z9012 Acquired absence of left breast and nipple: Secondary | ICD-10-CM

## 2011-03-30 DIAGNOSIS — Z1231 Encounter for screening mammogram for malignant neoplasm of breast: Secondary | ICD-10-CM

## 2011-05-04 ENCOUNTER — Ambulatory Visit
Admission: RE | Admit: 2011-05-04 | Discharge: 2011-05-04 | Disposition: A | Discharge status: Home / Self Care | Payer: BC Managed Care – PPO | Source: Ambulatory Visit | Attending: Oncology | Admitting: Oncology

## 2011-05-04 DIAGNOSIS — Z9012 Acquired absence of left breast and nipple: Secondary | ICD-10-CM

## 2011-05-04 DIAGNOSIS — Z1231 Encounter for screening mammogram for malignant neoplasm of breast: Secondary | ICD-10-CM

## 2011-06-15 ENCOUNTER — Encounter (HOSPITAL_BASED_OUTPATIENT_CLINIC_OR_DEPARTMENT_OTHER): Payer: BC Managed Care – PPO | Admitting: Oncology

## 2011-06-15 ENCOUNTER — Other Ambulatory Visit: Payer: Self-pay | Admitting: Oncology

## 2011-06-15 DIAGNOSIS — C50519 Malignant neoplasm of lower-outer quadrant of unspecified female breast: Secondary | ICD-10-CM

## 2011-06-15 DIAGNOSIS — N189 Chronic kidney disease, unspecified: Secondary | ICD-10-CM

## 2011-06-15 DIAGNOSIS — D649 Anemia, unspecified: Secondary | ICD-10-CM

## 2011-06-15 LAB — COMPREHENSIVE METABOLIC PANEL
ALT: 9 U/L (ref 0–35)
AST: 17 U/L (ref 0–37)
Creatinine, Ser: 1.13 mg/dL — ABNORMAL HIGH (ref 0.50–1.10)
Total Bilirubin: 0.4 mg/dL (ref 0.3–1.2)

## 2011-06-15 LAB — CBC & DIFF AND RETIC
BASO%: 0.3 % (ref 0.0–2.0)
Basophils Absolute: 0 10*3/uL (ref 0.0–0.1)
Eosinophils Absolute: 0.1 10*3/uL (ref 0.0–0.5)
HCT: 28.9 % — ABNORMAL LOW (ref 34.8–46.6)
HGB: 9.8 g/dL — ABNORMAL LOW (ref 11.6–15.9)
Immature Retic Fract: 13.5 % — ABNORMAL HIGH (ref 1.60–10.00)
LYMPH%: 26.7 % (ref 14.0–49.7)
MCHC: 33.9 g/dL (ref 31.5–36.0)
MONO#: 0.3 10*3/uL (ref 0.1–0.9)
NEUT%: 60.6 % (ref 38.4–76.8)
Platelets: 214 10*3/uL (ref 145–400)
WBC: 3 10*3/uL — ABNORMAL LOW (ref 3.9–10.3)
lymph#: 0.8 10*3/uL — ABNORMAL LOW (ref 0.9–3.3)

## 2011-06-15 LAB — MORPHOLOGY

## 2011-06-30 LAB — ESTRADIOL, ULTRA SENS

## 2011-07-14 LAB — POCT HEMOGLOBIN-HEMACUE: Hemoglobin: 8.9 — ABNORMAL LOW

## 2011-07-20 LAB — CBC
HCT: 26.7 — ABNORMAL LOW
Hemoglobin: 9.1 — ABNORMAL LOW
MCHC: 33.9
MCV: 86.5
RDW: 16.9 — ABNORMAL HIGH

## 2011-07-20 LAB — COMPREHENSIVE METABOLIC PANEL WITH GFR
ALT: 14
AST: 21
Albumin: 3.5
Alkaline Phosphatase: 105
BUN: 8
CO2: 25
Calcium: 9.3
Chloride: 107
Creatinine, Ser: 0.65
GFR calc non Af Amer: >60
Glucose, Bld: 98
Potassium: 3.7
Sodium: 139
Total Bilirubin: 0.5
Total Protein: 7

## 2011-07-20 LAB — DIFFERENTIAL
Lymphs Abs: 0.4 — ABNORMAL LOW
Monocytes Relative: 9
Neutro Abs: 1.7
Neutrophils Relative %: 72

## 2011-07-20 LAB — PROTIME-INR
INR: 1
Prothrombin Time: 13.7

## 2011-07-20 LAB — APTT

## 2011-07-21 LAB — BASIC METABOLIC PANEL
BUN: 9 mg/dL (ref 6–23)
Calcium: 9.6 mg/dL (ref 8.4–10.5)
GFR calc non Af Amer: >60 mL/min (ref >60)
Potassium: 3.7 mEq/L (ref 3.5–5.1)
Sodium: 140 mEq/L (ref 135–145)

## 2011-07-21 LAB — DIFFERENTIAL
Basophils Absolute: 0 10*3/uL (ref 0.0–0.1)
Eosinophils Relative: 1 % (ref 0–5)
Lymphocytes Relative: 27 % (ref 12–46)
Lymphs Abs: 0.8 10*3/uL (ref 0.7–4.0)
Neutro Abs: 1.8 10*3/uL (ref 1.7–7.7)

## 2011-07-21 LAB — CBC
HCT: 28.7 % — ABNORMAL LOW (ref 36.0–46.0)
Hemoglobin: 9.5 g/dL — ABNORMAL LOW (ref 12.0–15.0)
Platelets: 313 10*3/uL (ref 150–400)
WBC: 3 10*3/uL — ABNORMAL LOW (ref 4.0–10.5)

## 2011-08-27 ENCOUNTER — Telehealth: Payer: Self-pay | Admitting: *Deleted

## 2011-08-27 NOTE — Telephone Encounter (Signed)
Left voice messge to inform the patient of the new date and time of the new appointment.

## 2011-08-31 ENCOUNTER — Encounter: Payer: Self-pay | Admitting: *Deleted

## 2011-09-03 ENCOUNTER — Encounter: Payer: Self-pay | Admitting: *Deleted

## 2011-09-03 DIAGNOSIS — C50919 Malignant neoplasm of unspecified site of unspecified female breast: Secondary | ICD-10-CM | POA: Insufficient documentation

## 2011-09-24 ENCOUNTER — Ambulatory Visit
Admission: RE | Admit: 2011-09-24 | Payer: BC Managed Care – PPO | Source: Ambulatory Visit | Admitting: Radiation Oncology

## 2011-10-11 ENCOUNTER — Other Ambulatory Visit: Payer: Self-pay | Admitting: Oncology

## 2011-10-11 DIAGNOSIS — C50519 Malignant neoplasm of lower-outer quadrant of unspecified female breast: Secondary | ICD-10-CM

## 2011-10-11 DIAGNOSIS — N189 Chronic kidney disease, unspecified: Secondary | ICD-10-CM

## 2011-10-28 ENCOUNTER — Telehealth: Payer: Self-pay | Admitting: Oncology

## 2011-10-28 NOTE — Telephone Encounter (Signed)
pt rtn call and r/s appt from 01/10 to 01/22

## 2011-10-29 ENCOUNTER — Other Ambulatory Visit: Payer: BC Managed Care – PPO | Admitting: Lab

## 2011-10-29 ENCOUNTER — Ambulatory Visit: Payer: BC Managed Care – PPO | Admitting: Physician Assistant

## 2011-11-10 ENCOUNTER — Other Ambulatory Visit: Payer: BC Managed Care – PPO

## 2011-11-10 ENCOUNTER — Ambulatory Visit (HOSPITAL_BASED_OUTPATIENT_CLINIC_OR_DEPARTMENT_OTHER): Payer: BC Managed Care – PPO | Admitting: Physician Assistant

## 2011-11-10 ENCOUNTER — Encounter: Payer: Self-pay | Admitting: Physician Assistant

## 2011-11-10 ENCOUNTER — Other Ambulatory Visit: Payer: Self-pay | Admitting: Oncology

## 2011-11-10 VITALS — BP 116/79 | HR 98 | Temp 98.2°F | Ht <= 58 in | Wt 182.4 lb

## 2011-11-10 DIAGNOSIS — I10 Essential (primary) hypertension: Secondary | ICD-10-CM

## 2011-11-10 DIAGNOSIS — C50912 Malignant neoplasm of unspecified site of left female breast: Secondary | ICD-10-CM

## 2011-11-10 DIAGNOSIS — C50919 Malignant neoplasm of unspecified site of unspecified female breast: Secondary | ICD-10-CM

## 2011-11-10 LAB — CBC WITH DIFFERENTIAL/PLATELET
BASO%: 0.4 % (ref 0.0–2.0)
Eosinophils Absolute: 0.1 10*3/uL (ref 0.0–0.5)
HCT: 29.5 % — ABNORMAL LOW (ref 34.8–46.6)
HGB: 10.2 g/dL — ABNORMAL LOW (ref 11.6–15.9)
LYMPH%: 24.7 % (ref 14.0–49.7)
MCHC: 34.5 g/dL (ref 31.5–36.0)
MONO#: 0.3 10*3/uL (ref 0.1–0.9)
NEUT#: 2.2 10*3/uL (ref 1.5–6.5)
NEUT%: 62.1 % (ref 38.4–76.8)
Platelets: 244 10*3/uL (ref 145–400)
WBC: 3.5 10*3/uL — ABNORMAL LOW (ref 3.9–10.3)
lymph#: 0.9 10*3/uL (ref 0.9–3.3)

## 2011-11-10 MED ORDER — LETROZOLE 2.5 MG PO TABS: 2.5000 mg | ORAL_TABLET | Freq: Every day | ORAL | Status: DC
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
## 2011-11-11 ENCOUNTER — Other Ambulatory Visit: Payer: Self-pay | Admitting: Physician Assistant

## 2011-11-11 DIAGNOSIS — C50919 Malignant neoplasm of unspecified site of unspecified female breast: Secondary | ICD-10-CM

## 2011-11-11 DIAGNOSIS — Z1231 Encounter for screening mammogram for malignant neoplasm of breast: Secondary | ICD-10-CM

## 2011-11-12 ENCOUNTER — Encounter (INDEPENDENT_AMBULATORY_CARE_PROVIDER_SITE_OTHER): Payer: Self-pay | Admitting: General Surgery

## 2011-11-24 ENCOUNTER — Telehealth: Payer: Self-pay | Admitting: *Deleted

## 2011-11-24 NOTE — Telephone Encounter (Signed)
THERE ARE NO PROBLEMS RELATED TO THE LETROZOLE AND FLU SHOT. PT. DOES NOT WANT TO TAKE THE FLU SHOT. DISCUSSED WITH PT. THE IMPORTANCE OF TAKING THE FLU SHOT.

## 2012-02-02 ENCOUNTER — Other Ambulatory Visit: Payer: Self-pay | Admitting: Oncology

## 2012-04-19 ENCOUNTER — Ambulatory Visit (INDEPENDENT_AMBULATORY_CARE_PROVIDER_SITE_OTHER): Payer: BC Managed Care – PPO | Admitting: Family Medicine

## 2012-04-19 ENCOUNTER — Encounter: Payer: Self-pay | Admitting: Family Medicine

## 2012-04-19 VITALS — BP 124/78 | HR 93 | Ht 59.0 in | Wt 180.1 lb

## 2012-04-19 DIAGNOSIS — C50919 Malignant neoplasm of unspecified site of unspecified female breast: Secondary | ICD-10-CM

## 2012-04-19 DIAGNOSIS — Z1151 Encounter for screening for human papillomavirus (HPV): Secondary | ICD-10-CM

## 2012-04-19 DIAGNOSIS — Z01419 Encounter for gynecological examination (general) (routine) without abnormal findings: Secondary | ICD-10-CM

## 2012-04-19 DIAGNOSIS — Z124 Encounter for screening for malignant neoplasm of cervix: Secondary | ICD-10-CM

## 2012-04-19 DIAGNOSIS — E669 Obesity, unspecified: Secondary | ICD-10-CM

## 2012-04-19 LAB — CBC
MCH: 28.2 pg (ref 26.0–34.0)
MCV: 83.1 fL (ref 78.0–100.0)
Platelets: 236 10*3/uL (ref 150–400)
RBC: 3.55 MIL/uL — ABNORMAL LOW (ref 3.87–5.11)
RDW: 15.2 % (ref 11.5–15.5)
WBC: 3.2 10*3/uL — ABNORMAL LOW (ref 4.0–10.5)

## 2012-04-19 LAB — COMPREHENSIVE METABOLIC PANEL
ALT: 40 U/L — ABNORMAL HIGH (ref 0–35)
AST: 40 U/L — ABNORMAL HIGH (ref 0–37)
Chloride: 102 mEq/L (ref 96–112)
Creat: 1.15 mg/dL — ABNORMAL HIGH (ref 0.50–1.10)
Sodium: 138 mEq/L (ref 135–145)
Total Bilirubin: 0.4 mg/dL (ref 0.3–1.2)
Total Protein: 7.8 g/dL (ref 6.0–8.3)

## 2012-04-19 LAB — LIPID PANEL
HDL: 40 mg/dL (ref >39)
Total CHOL/HDL Ratio: 4.3 Ratio
VLDL: 22 mg/dL (ref 0–40)

## 2012-04-19 NOTE — Progress Notes (Signed)
  Subjective:     Melanie Holloway is a 46 y.o. female and is here for a comprehensive physical exam. The patient reports problems - hands falling asleep during evening rest.  She does work on the computer and is not bothered at that time.  She also, has a h/o breast cancer and is s/p treatment with mastectomy and radiation and chemo.  She became menopausal during treatment.  S/p BRCA testing and negative.  She sees Dr. Jana Hakim in August for [facility] and [facility].  Does not have a PCP. Marland Kitchen  History   Social History  . Marital Status: Married    Spouse Name: N/A    Number of Children: N/A  . Years of Education: N/A   Occupational History  . Not on file.   Social History Main Topics  . Smoking status: Never Smoker   . Smokeless tobacco: Not on file  . Alcohol Use: No  . Drug Use: No  . Sexually Active: Not on file   Other Topics Concern  . Not on file   Social History Narrative  . No narrative on file   Health Maintenance  Topic Date Due  . Pap Smear  10/30/1982  . Tetanus/tdap  10/31/1983  . Influenza Vaccine  07/19/2012    The following portions of the patient's history were reviewed and updated as appropriate: allergies, current medications, past family history, past medical history, past social history, past surgical history and problem list.  Review of Systems A comprehensive review of systems was negative.   Objective:    BP 124/78  Pulse 93  Ht 4\' 11"  (1.499 m)  Wt 180 lb 2 oz (81.704 kg)  BMI 36.38 kg/m2 General appearance: alert, cooperative and appears stated age Head: Normocephalic, without obvious abnormality, atraumatic Neck: no adenopathy, supple, symmetrical, trachea midline, thyroid not enlarged, symmetric, no tenderness/mass/nodules and acanthosis nigricans present. Lungs: clear to auscultation bilaterally Heart: regular rate and rhythm, S1, S2 normal, no murmur, click, rub or gallop Abdomen: soft, non-tender; bowel sounds normal; no masses,  no  organomegaly Pelvic: cervix normal in appearance, external genitalia normal, no adnexal masses or tenderness, no cervical motion tenderness, uterus normal size, shape, and consistency and vaginal atrophy Extremities: extremities normal, atraumatic, no cyanosis or edema Pulses: 2+ and symmetric Skin: Skin color, texture, turgor normal. No rashes or lesions Neurologic: Grossly normal    Assessment:    Healthy female exam. H/o Breast Cancer HTN      Plan:    Pap smear today Breast f/u per Dr. Jana Hakim Referral to PCP Lipid, CMP, CBC today. See After Visit Summary for Counseling Recommendations

## 2012-04-19 NOTE — Patient Instructions (Signed)
Preventive Care for Adults, Female A healthy lifestyle and preventive care can promote health and wellness. Preventive health guidelines for women include the following key practices.  A routine yearly physical is a good way to check with your caregiver about your health and preventive screening. It is a chance to share any concerns and updates on your health, and to receive a thorough exam.   Visit your dentist for a routine exam and preventive care every 6 months. Brush your teeth twice a day and floss once a day. Good oral hygiene prevents tooth decay and gum disease.   The frequency of eye exams is based on your age, health, family medical history, use of contact lenses, and other factors. Follow your caregiver's recommendations for frequency of eye exams.   Eat a healthy diet. Foods like vegetables, fruits, whole grains, low-fat dairy products, and lean protein foods contain the nutrients you need without too many calories. Decrease your intake of foods high in solid fats, added sugars, and salt. Eat the right amount of calories for you.Get information about a proper diet from your caregiver, if necessary.   Regular physical exercise is one of the most important things you can do for your health. Most adults should get at least 150 minutes of moderate-intensity exercise (any activity that increases your heart rate and causes you to sweat) each week. In addition, most adults need muscle-strengthening exercises on 2 or more days a week.   Maintain a healthy weight. The body mass index (BMI) is a screening tool to identify possible weight problems. It provides an estimate of body fat based on height and weight. Your caregiver can help determine your BMI, and can help you achieve or maintain a healthy weight.For adults 20 years and older:   A BMI below 18.5 is considered underweight.   A BMI of 18.5 to 24.9 is normal.   A BMI of 25 to 29.9 is considered overweight.   A BMI of 30 and above is  considered obese.   Maintain normal blood lipids and cholesterol levels by exercising and minimizing your intake of saturated fat. Eat a balanced diet with plenty of fruit and vegetables. Blood tests for lipids and cholesterol should begin at age 20 and be repeated every 5 years. If your lipid or cholesterol levels are high, you are over 50, or you are at high risk for heart disease, you may need your cholesterol levels checked more frequently.Ongoing high lipid and cholesterol levels should be treated with medicines if diet and exercise are not effective.   If you smoke, find out from your caregiver how to quit. If you do not use tobacco, do not start.   If you are pregnant, do not drink alcohol. If you are breastfeeding, be very cautious about drinking alcohol. If you are not pregnant and choose to drink alcohol, do not exceed 1 drink per day. One drink is considered to be 12 ounces (355 mL) of beer, 5 ounces (148 mL) of wine, or 1.5 ounces (44 mL) of liquor.   Avoid use of street drugs. Do not share needles with anyone. Ask for help if you need support or instructions about stopping the use of drugs.   High blood pressure causes heart disease and increases the risk of stroke. Your blood pressure should be checked at least every 1 to 2 years. Ongoing high blood pressure should be treated with medicines if weight loss and exercise are not effective.   If you are 55 to 47   years old, ask your caregiver if you should take aspirin to prevent strokes.   Diabetes screening involves taking a blood sample to check your fasting blood sugar level. This should be done once every 3 years, after age 45, if you are within normal weight and without risk factors for diabetes. Testing should be considered at a younger age or be carried out more frequently if you are overweight and have at least 1 risk factor for diabetes.   Breast cancer screening is essential preventive care for women. You should practice "breast  self-awareness." This means understanding the normal appearance and feel of your breasts and may include breast self-examination. Any changes detected, no matter how small, should be reported to a caregiver. Women in their 20s and 30s should have a clinical breast exam (CBE) by a caregiver as part of a regular health exam every 1 to 3 years. After age 40, women should have a CBE every year. Starting at age 40, women should consider having a mammography (breast X-ray test) every year. Women who have a family history of breast cancer should talk to their caregiver about genetic screening. Women at a high risk of breast cancer should talk to their caregivers about having magnetic resonance imaging (MRI) and a mammography every year.   The Pap test is a screening test for cervical cancer. A Pap test can show cell changes on the cervix that might become cervical cancer if left untreated. A Pap test is a procedure in which cells are obtained and examined from the lower end of the uterus (cervix).   Women should have a Pap test starting at age 21.   Between ages 21 and 29, Pap tests should be repeated every 2 years.   Beginning at age 30, you should have a Pap test every 3 years as long as the past 3 Pap tests have been normal.   Some women have medical problems that increase the chance of getting cervical cancer. Talk to your caregiver about these problems. It is especially important to talk to your caregiver if a new problem develops soon after your last Pap test. In these cases, your caregiver may recommend more frequent screening and Pap tests.   The above recommendations are the same for women who have or have not gotten the vaccine for human papillomavirus (HPV).   If you had a hysterectomy for a problem that was not cancer or a condition that could lead to cancer, then you no longer need Pap tests. Even if you no longer need a Pap test, a regular exam is a good idea to make sure no other problems are  starting.   If you are between ages 65 and 70, and you have had normal Pap tests going back 10 years, you no longer need Pap tests. Even if you no longer need a Pap test, a regular exam is a good idea to make sure no other problems are starting.   If you have had past treatment for cervical cancer or a condition that could lead to cancer, you need Pap tests and screening for cancer for at least 20 years after your treatment.   If Pap tests have been discontinued, risk factors (such as a new sexual partner) need to be reassessed to determine if screening should be resumed.   The HPV test is an additional test that may be used for cervical cancer screening. The HPV test looks for the virus that can cause the cell changes on the cervix.   The cells collected during the Pap test can be tested for HPV. The HPV test could be used to screen women aged 30 years and older, and should be used in women of any age who have unclear Pap test results. After the age of 30, women should have HPV testing at the same frequency as a Pap test.   Colorectal cancer can be detected and often prevented. Most routine colorectal cancer screening begins at the age of 50 and continues through age 75. However, your caregiver may recommend screening at an earlier age if you have risk factors for colon cancer. On a yearly basis, your caregiver may provide home test kits to check for hidden blood in the stool. Use of a small camera at the end of a tube, to directly examine the colon (sigmoidoscopy or colonoscopy), can detect the earliest forms of colorectal cancer. Talk to your caregiver about this at age 50, when routine screening begins. Direct examination of the colon should be repeated every 5 to 10 years through age 75, unless early forms of pre-cancerous polyps or small growths are found.   Hepatitis C blood testing is recommended for all people born from 1945 through 1965 and any individual with known risks for hepatitis C.    Practice safe sex. Use condoms and avoid high-risk sexual practices to reduce the spread of sexually transmitted infections (STIs). STIs include gonorrhea, chlamydia, syphilis, trichomonas, herpes, HPV, and human immunodeficiency virus (HIV). Herpes, HIV, and HPV are viral illnesses that have no cure. They can result in disability, cancer, and death. Sexually active women aged 25 and younger should be checked for chlamydia. Older women with new or multiple partners should also be tested for chlamydia. Testing for other STIs is recommended if you are sexually active and at increased risk.   Osteoporosis is a disease in which the bones lose minerals and strength with aging. This can result in serious bone fractures. The risk of osteoporosis can be identified using a bone density scan. Women ages 65 and over and women at risk for fractures or osteoporosis should discuss screening with their caregivers. Ask your caregiver whether you should take a calcium supplement or vitamin D to reduce the rate of osteoporosis.   Menopause can be associated with physical symptoms and risks. Hormone replacement therapy is available to decrease symptoms and risks. You should talk to your caregiver about whether hormone replacement therapy is right for you.   Use sunscreen with sun protection factor (SPF) of 30 or more. Apply sunscreen liberally and repeatedly throughout the day. You should seek shade when your shadow is shorter than you. Protect yourself by wearing long sleeves, pants, a wide-brimmed hat, and sunglasses year round, whenever you are outdoors.   Once a month, do a whole body skin exam, using a mirror to look at the skin on your back. Notify your caregiver of new moles, moles that have irregular borders, moles that are larger than a pencil eraser, or moles that have changed in shape or color.   Stay current with required immunizations.   Influenza. You need a dose every fall (or winter). The composition of  the flu vaccine changes each year, so being vaccinated once is not enough.   Pneumococcal polysaccharide. You need 1 to 2 doses if you smoke cigarettes or if you have certain chronic medical conditions. You need 1 dose at age 65 (or older) if you have never been vaccinated.   Tetanus, diphtheria, pertussis (Tdap, Td). Get 1 dose of   Tdap vaccine if you are younger than age 65, are over 65 and have contact with an infant, are a healthcare worker, are pregnant, or simply want to be protected from whooping cough. After that, you need a Td booster dose every 10 years. Consult your caregiver if you have not had at least 3 tetanus and diphtheria-containing shots sometime in your life or have a deep or dirty wound.   HPV. You need this vaccine if you are a woman age 26 or younger. The vaccine is given in 3 doses over 6 months.   Measles, mumps, rubella (MMR). You need at least 1 dose of MMR if you were born in 1957 or later. You may also need a second dose.   Meningococcal. If you are age 19 to 21 and a first-year college student living in a residence hall, or have one of several medical conditions, you need to get vaccinated against meningococcal disease. You may also need additional booster doses.   Zoster (shingles). If you are age 60 or older, you should get this vaccine.   Varicella (chickenpox). If you have never had chickenpox or you were vaccinated but received only 1 dose, talk to your caregiver to find out if you need this vaccine.   Hepatitis A. You need this vaccine if you have a specific risk factor for hepatitis A virus infection or you simply wish to be protected from this disease. The vaccine is usually given as 2 doses, 6 to 18 months apart.   Hepatitis B. You need this vaccine if you have a specific risk factor for hepatitis B virus infection or you simply wish to be protected from this disease. The vaccine is given in 3 doses, usually over 6 months.  Preventive Services /  Frequency Ages 19 to 39  Blood pressure check.** / Every 1 to 2 years.   Lipid and cholesterol check.** / Every 5 years beginning at age 20.   Clinical breast exam.** / Every 3 years for women in their 20s and 30s.   Pap test.** / Every 2 years from ages 21 through 29. Every 3 years starting at age 30 through age 65 or 70 with a history of 3 consecutive normal Pap tests.   HPV screening.** / Every 3 years from ages 30 through ages 65 to 70 with a history of 3 consecutive normal Pap tests.   Hepatitis C blood test.** / For any individual with known risks for hepatitis C.   Skin self-exam. / Monthly.   Influenza immunization.** / Every year.   Pneumococcal polysaccharide immunization.** / 1 to 2 doses if you smoke cigarettes or if you have certain chronic medical conditions.   Tetanus, diphtheria, pertussis (Tdap, Td) immunization. / A one-time dose of Tdap vaccine. After that, you need a Td booster dose every 10 years.   HPV immunization. / 3 doses over 6 months, if you are 26 and younger.   Measles, mumps, rubella (MMR) immunization. / You need at least 1 dose of MMR if you were born in 1957 or later. You may also need a second dose.   Meningococcal immunization. / 1 dose if you are age 19 to 21 and a first-year college student living in a residence hall, or have one of several medical conditions, you need to get vaccinated against meningococcal disease. You may also need additional booster doses.   Varicella immunization.** / Consult your caregiver.   Hepatitis A immunization.** / Consult your caregiver. 2 doses, 6 to 18 months   apart.   Hepatitis B immunization.** / Consult your caregiver. 3 doses usually over 6 months.  Ages 40 to 64  Blood pressure check.** / Every 1 to 2 years.   Lipid and cholesterol check.** / Every 5 years beginning at age 20.   Clinical breast exam.** / Every year after age 40.   Mammogram.** / Every year beginning at age 40 and continuing for as  long as you are in good health. Consult with your caregiver.   Pap test.** / Every 3 years starting at age 30 through age 65 or 70 with a history of 3 consecutive normal Pap tests.   HPV screening.** / Every 3 years from ages 30 through ages 65 to 70 with a history of 3 consecutive normal Pap tests.   Fecal occult blood test (FOBT) of stool. / Every year beginning at age 50 and continuing until age 75. You may not need to do this test if you get a colonoscopy every 10 years.   Flexible sigmoidoscopy or colonoscopy.** / Every 5 years for a flexible sigmoidoscopy or every 10 years for a colonoscopy beginning at age 50 and continuing until age 75.   Hepatitis C blood test.** / For all people born from 1945 through 1965 and any individual with known risks for hepatitis C.   Skin self-exam. / Monthly.   Influenza immunization.** / Every year.   Pneumococcal polysaccharide immunization.** / 1 to 2 doses if you smoke cigarettes or if you have certain chronic medical conditions.   Tetanus, diphtheria, pertussis (Tdap, Td) immunization.** / A one-time dose of Tdap vaccine. After that, you need a Td booster dose every 10 years.   Measles, mumps, rubella (MMR) immunization. / You need at least 1 dose of MMR if you were born in 1957 or later. You may also need a second dose.   Varicella immunization.** / Consult your caregiver.   Meningococcal immunization.** / Consult your caregiver.   Hepatitis A immunization.** / Consult your caregiver. 2 doses, 6 to 18 months apart.   Hepatitis B immunization.** / Consult your caregiver. 3 doses, usually over 6 months.  Ages 65 and over  Blood pressure check.** / Every 1 to 2 years.   Lipid and cholesterol check.** / Every 5 years beginning at age 20.   Clinical breast exam.** / Every year after age 40.   Mammogram.** / Every year beginning at age 40 and continuing for as long as you are in good health. Consult with your caregiver.   Pap test.** /  Every 3 years starting at age 30 through age 65 or 70 with a 3 consecutive normal Pap tests. Testing can be stopped between 65 and 70 with 3 consecutive normal Pap tests and no abnormal Pap or HPV tests in the past 10 years.   HPV screening.** / Every 3 years from ages 30 through ages 65 or 70 with a history of 3 consecutive normal Pap tests. Testing can be stopped between 65 and 70 with 3 consecutive normal Pap tests and no abnormal Pap or HPV tests in the past 10 years.   Fecal occult blood test (FOBT) of stool. / Every year beginning at age 50 and continuing until age 75. You may not need to do this test if you get a colonoscopy every 10 years.   Flexible sigmoidoscopy or colonoscopy.** / Every 5 years for a flexible sigmoidoscopy or every 10 years for a colonoscopy beginning at age 50 and continuing until age 75.   Hepatitis   C blood test.** / For all people born from 1945 through 1965 and any individual with known risks for hepatitis C.   Osteoporosis screening.** / A one-time screening for women ages 65 and over and women at risk for fractures or osteoporosis.   Skin self-exam. / Monthly.   Influenza immunization.** / Every year.   Pneumococcal polysaccharide immunization.** / 1 dose at age 65 (or older) if you have never been vaccinated.   Tetanus, diphtheria, pertussis (Tdap, Td) immunization. / A one-time dose of Tdap vaccine if you are over 65 and have contact with an infant, are a healthcare worker, or simply want to be protected from whooping cough. After that, you need a Td booster dose every 10 years.   Varicella immunization.** / Consult your caregiver.   Meningococcal immunization.** / Consult your caregiver.   Hepatitis A immunization.** / Consult your caregiver. 2 doses, 6 to 18 months apart.   Hepatitis B immunization.** / Check with your caregiver. 3 doses, usually over 6 months.  ** Family history and personal history of risk and conditions may change your caregiver's  recommendations. Document Released: 12/01/2001 Document Revised: 09/24/2011 Document Reviewed: 03/02/2011 [employer] Patient Information 2012 [employer]. 

## 2012-04-20 NOTE — Progress Notes (Signed)
Melanie Holloway spoke with patient and will be referring her to a PCP.

## 2012-04-25 NOTE — Progress Notes (Signed)
Spoke to patient regarding labs result. Patient aware of lab result and ask to faxed copy to her.

## 2012-05-05 ENCOUNTER — Ambulatory Visit
Admission: RE | Admit: 2012-05-05 | Discharge: 2012-05-05 | Disposition: A | Discharge status: Home / Self Care | Payer: BC Managed Care – PPO | Source: Ambulatory Visit | Attending: Physician Assistant | Admitting: Physician Assistant

## 2012-05-05 DIAGNOSIS — Z1231 Encounter for screening mammogram for malignant neoplasm of breast: Secondary | ICD-10-CM

## 2012-05-05 DIAGNOSIS — C50912 Malignant neoplasm of unspecified site of left female breast: Secondary | ICD-10-CM

## 2012-05-10 ENCOUNTER — Other Ambulatory Visit (HOSPITAL_BASED_OUTPATIENT_CLINIC_OR_DEPARTMENT_OTHER): Payer: BC Managed Care – PPO | Admitting: Lab

## 2012-05-10 DIAGNOSIS — C50919 Malignant neoplasm of unspecified site of unspecified female breast: Secondary | ICD-10-CM

## 2012-05-10 DIAGNOSIS — C50912 Malignant neoplasm of unspecified site of left female breast: Secondary | ICD-10-CM

## 2012-05-10 LAB — CBC WITH DIFFERENTIAL/PLATELET
Basophils Absolute: 0 10*3/uL (ref 0.0–0.1)
EOS%: 0.9 % (ref 0.0–7.0)
HCT: 28.9 % — ABNORMAL LOW (ref 34.8–46.6)
HGB: 9.8 g/dL — ABNORMAL LOW (ref 11.6–15.9)
LYMPH%: 30.2 % (ref 14.0–49.7)
MCH: 28 pg (ref 25.1–34.0)
MCHC: 33.8 g/dL (ref 31.5–36.0)
MCV: 82.7 fL (ref 79.5–101.0)
MONO%: 11.5 % (ref 0.0–14.0)
NEUT%: 57.2 % (ref 38.4–76.8)

## 2012-05-11 LAB — COMPREHENSIVE METABOLIC PANEL
AST: 44 U/L — ABNORMAL HIGH (ref 0–37)
Alkaline Phosphatase: 114 U/L (ref 39–117)
BUN: 22 mg/dL (ref 6–23)
Calcium: 8.8 mg/dL (ref 8.4–10.5)
Creatinine, Ser: 1.06 mg/dL (ref 0.50–1.10)
Total Bilirubin: 0.3 mg/dL (ref 0.3–1.2)

## 2012-05-14 ENCOUNTER — Other Ambulatory Visit: Payer: Self-pay | Admitting: Oncology

## 2012-05-14 DIAGNOSIS — C50919 Malignant neoplasm of unspecified site of unspecified female breast: Secondary | ICD-10-CM

## 2012-05-14 DIAGNOSIS — I10 Essential (primary) hypertension: Secondary | ICD-10-CM

## 2012-05-16 ENCOUNTER — Telehealth: Payer: Self-pay | Admitting: *Deleted

## 2012-05-16 NOTE — Telephone Encounter (Signed)
Refill authorized, no note.

## 2012-05-17 ENCOUNTER — Ambulatory Visit (HOSPITAL_BASED_OUTPATIENT_CLINIC_OR_DEPARTMENT_OTHER): Payer: BC Managed Care – PPO | Admitting: Oncology

## 2012-05-17 ENCOUNTER — Telehealth: Payer: Self-pay | Admitting: *Deleted

## 2012-05-17 VITALS — BP 118/81 | HR 97 | Temp 98.6°F | Ht 59.0 in | Wt 180.5 lb

## 2012-05-17 DIAGNOSIS — R7989 Other specified abnormal findings of blood chemistry: Secondary | ICD-10-CM

## 2012-05-17 DIAGNOSIS — C50919 Malignant neoplasm of unspecified site of unspecified female breast: Secondary | ICD-10-CM

## 2012-05-17 DIAGNOSIS — C773 Secondary and unspecified malignant neoplasm of axilla and upper limb lymph nodes: Secondary | ICD-10-CM

## 2012-05-17 DIAGNOSIS — I10 Essential (primary) hypertension: Secondary | ICD-10-CM

## 2012-05-17 DIAGNOSIS — R05 Cough: Secondary | ICD-10-CM

## 2012-05-17 MED ORDER — LOSARTAN POTASSIUM 25 MG PO TABS: 25.0000 mg | ORAL_TABLET | Freq: Every day | ORAL | Status: DC

## 2012-05-17 NOTE — Telephone Encounter (Signed)
Gave patient appointmen for scan on 05-24-2012 arrival time 7:45am gave patient instructions and the contrast to drink gave patient appointment for follow up with Micah Flesher on 11-17-2012 starting at 8:15am

## 2012-05-17 NOTE — Progress Notes (Signed)
ID: Melanie Holloway   DOB: 10-06-1966  MR#: [id number]  [id number]  HISTORY OF PRESENT ILLNESS: Melanie Holloway palpated a mass in her left breast April of 2008. She brought it to Dr. [name] attention and he set her up for mammography, which was performed 12/23/2007 at [facility]. This was her first ever mammogram and it showed a lobulated mass in the lower outer quadrant of the left breast measuring up to 15 cm. This was easily palpable. There were also enlarged lymph nodes in the left axilla. Lymph nodes in the right axilla were mildly prominent, but the right breast was otherwise unremarkable.  Ultrasound-guided biopsy was performed the same day and showed [id number] and [phone]) an invasive ductal carcinoma involving both the breast and the left axilla, ER positive at 99%, PR positive at 74%, with an MIB-1 of 20%, HER2-neu 1+. Biopsy of one of the right axillary lymph nodes showed only benign changes.  With this information, the patient was referred to Dr. [name] and as per the [facility] protocol, bilateral breast MRIs were obtained 01/02/2008. This confirmed the presence of a left breast mass measuring up to 7.1 cm by MRI with several enlarged left axillary lymph nodes. In the right axilla, lymph nodes were identified, which did not have central fatty hilum, the largest measuring 1.2 and in the right breast there was an irregular lobulated mass measuring 2.9 cm adjacent to an inframammary lymph node.  Staging studies showed no evidence of metastatic disease. The PET scan in particular showed 1 left axillary lymph node, which has an SUV of 4.4. It measured 1.9 cm. Of course, her breast mass measuring up to 7.1 cm had an uptake of 11.3, which is very hot. The only other area, which was minimally hot was an enlarged left external iliac lymph node, which had an SUV of 3.1. This just requires followup-this is not going to be related to the patient's tumor.  She had a negative  bone scan and CTs of the chest, abdomen and pelvis showed some nonspecific findings including a 2-mm right middle lobe lung nodule and slightly prominent right axillary lymph nodes without frank adenopathy, these not being hypermetabolic. There wa some cholelithiasis without cholecystitis-again, there was borderline retroperitoneal lymphadenopathy and a probably fibroid uterus on the pelvic exam. Overall, this did not show any evidence of metastatic disease, and the patient therefore remained a stage III breast cancer, with a clinical T3N1MX infiltrating ductal carcinoma, which was strongly ER/PR positive, with an MIB-1 of 20%, and HercepTest negative at 1+. Her subsequent history is as detailed below.  INTERVAL HISTORY: Melanie Holloway returns today for routine followup of her breast cancer. She continues to have the same job. She tells me family is doing well. She tries to take 10-15 minute walks most days at lunchtime, if the weather permits.  REVIEW OF SYSTEMS: She had a "cold" in may, with some fever and cough, no significant phlegm, pleurisy, or other problems. She did not bring this to her physician's attention, just "lived through it". Those symptoms completely resolved. Now however she has a persistent dry cough. Again there is no pleurisy, shortness of breath, or other associated symptoms. A detailed review of systems is otherwise negative and in particular she denies change in appetite, taste perversion, nausea or vomiting problems.  PAST MEDICAL HISTORY: Past Medical History  Diagnosis Date  . Cancer 2009    BREAST  . Wears glasses   . Full dentures   . Breast cancer  er/pr+, her2 -  . Hypertension   . Obesity   . Abnormal Pap smear     8 years ago    PAST SURGICAL HISTORY: Past Surgical History  Procedure Date  . Mastectomy 2009    LEFT  . Ankle surgery 1987  . Orif fibula fracture     left  . Tubal ligation     bilat  . Simple mastectomy 09/18/08    L    FAMILY  HISTORY Family History  Problem Relation Age of Onset  . Diabetes Father   . Cancer Paternal Grandmother     breast, age 62's  . Cancer Cousin     breast    GYNECOLOGIC HISTORY: She is GX P3, first pregnancy to term age 77, last menstrual period 12/23/2007. She is not experiencing hot flashes. Status post tubal ligation.  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 Her husband, Melanie Holloway, is a Occupational psychologist. She has a son, Melanie Holloway, who works on cars and lives in [place]; a daughter Melanie Holloway,  who lives at home; and a second daughter Melanie Holloway,  (this is the one child she shares with Melanie Holloway) also living at home. The patient attends the [employer]    ADVANCED DIRECTIVES: not in place  HEALTH MAINTENANCE: History  Substance Use Topics  . Smoking status: Never Smoker   . Smokeless tobacco: Not on file  . Alcohol Use: No     Colonoscopy:  PAP:  Bone density:  Lipid panel:  No Known Allergies  Current Outpatient Prescriptions  Medication Sig Dispense Refill  . hydrochlorothiazide (HYDRODIURIL) 25 MG tablet Take 25 mg by mouth daily.      Marland Kitchen letrozole (FEMARA) 2.5 MG tablet Take 1 tablet (2.5 mg total) by mouth daily.  30 tablet  12  . lisinopril (PRINIVIL,ZESTRIL) 10 MG tablet TAKE 1 TABLET BY MOUTH DAILY  90 tablet  1    OBJECTIVE: Middle-aged African American woman in no acute distress Filed Vitals:   05/17/12 0912  BP: 118/81  Pulse: 97  Temp: 98.6 F (37 C)     Body mass index is 36.46 kg/(m^2).    ECOG FS: 0  Sclerae unicteric Oropharynx clear No cervical or supraclavicular adenopathy Lungs no rales or rhonchi Heart regular rate and rhythm Abd benign MSK no focal spinal tenderness, no peripheral edema Neuro: nonfocal Breasts: The right breast shows no suspicious masses or skin changes, no nipple retraction. The right axilla is negative. The left breast is status post mastectomy. There is no  evidence of local recurrence.  LAB RESULTS: Lab Results  Component Value Date   WBC 3.4* 05/10/2012   NEUTROABS 1.9 05/10/2012   HGB 9.8* 05/10/2012   HCT 28.9* 05/10/2012   MCV 82.7 05/10/2012   PLT 180 05/10/2012      Chemistry      Component Value Date/Time   NA 137 05/10/2012 0808   K 3.8 05/10/2012 0808   CL 101 05/10/2012 0808   CO2 26 05/10/2012 0808   BUN 22 05/10/2012 0808   CREATININE 1.06 05/10/2012 0808   CREATININE 1.15* 04/19/2012 1014      Component Value Date/Time   CALCIUM 8.8 05/10/2012 0808   ALKPHOS 114 05/10/2012 0808   AST 44* 05/10/2012 0808   ALT 47* 05/10/2012 0808   BILITOT 0.3 05/10/2012 0808       Lab Results  Component Value Date   LABCA2 41* 05/10/2012    No  components found with this basename: LABCA125    No results found for this basename: INR:1;PROTIME:1 in the last 168 hours  Urinalysis No results found for this basename: colorurine, appearanceur, labspec, phurine, glucoseu, hgbur, bilirubinur, ketonesur, proteinur, urobilinogen, nitrite, leukocytesur    STUDIES: Dg Bone Density  05/05/2012  *RADIOLOGY REPORT*  Clinical Data: 46 year old postmenopausal female  DUAL X-RAY ABSORPTIOMETRY (DXA) FOR BONE MINERAL DENSITY  AP LUMBAR SPINE (L1-L4)  Bone Mineral Density (BMD):            1.087 g/cm2 Young Adult T Score:                          0.4 Z Score:                                                0.1  LEFT FEMUR NECK  Bone Mineral Density (BMD):             0.870 g/cm2 Young Adult T Score:                           0.2 Z Score:                                                 -0.2  ASSESSMENT:  Patient's diagnostic category is NORMAL by WHO Criteria.  FRACTURE RISK: NOT INCREASED  FRAX: World Health Organization FRAX assessment of absolute fracture risk is not calculated for this patient because the patient has a normal study.  Comparison: None  RECOMMENDATIONS:  Effective therapies are available in the form of bisphosphonates, selective estrogen receptor  modulators, biologic agents, and hormone replacement therapy (for women).  All patients should ensure an adequate intake of dietary calcium (1200mg  daily) and vitamin D (800 IU daily) unless contraindicated.  All treatment decisions require clinical judgement and consideration of individual patient factors, including patient preferences, co-morbidities, previous drug use, risk factors not captured in the FRAX model (e.g., frailty, falls, vitamin D deficiency, increased bone turnover, interval significant decline in bone density) and possible under-or over-estimation of fracture risk by FRAX.  The National Osteoporosis Foundation recommends that FDA-approved medical therapies be considered in postmenopausal women and mean age 50 or older with a:        1)     Hip or vertebral (clinical or morphometric) fracture.           2)    T-score of -2.5 or lower at the spine or hip. 3)    Ten-year fracture probability by FRAX of 3% or greater for hip fracture or 20% or greater for major osteoporotic fracture. FOLLOW-UP:  People with diagnosed cases of osteoporosis or at high risk for fracture should have regular bone mineral density tests.  For patients eligible for Medicare, routine testing is allowed once every 2 years.  The testing frequency can be increased to one year for patients who have rapidly progressing disease, those who are receiving or discontinuing medical therapy to restore bone mass, or have additional risk factors.  World Health Organization [facility]) Criteria:  Normal: T scores from +1.0 to -1.0 Low Bone Mass (Osteopenia): T scores between -1.0 and -2.5  Osteoporosis: T scores -2.5 and below  Comparison to Reference Population:  T score is the key measure used in the diagnosis of osteoporosis and relative risk determination for fracture.  It provides a value for bone mass relative to the mean bone mass of a young adult reference population expressed in terms of standard deviation (SD).  Z score is the age-matched  score showing the patient's values compared to a population matched for age, sex, and race.  This is also expressed in terms of standard deviation.  The patient may have values that compare favorably to the age-matched values and still be at increased risk for fracture.  Original Report Authenticated By: Jeanie Cooks. Miquel Dunn, M.D.   Mm Digital Screening Unilat R  05/05/2012  *RADIOLOGY REPORT*  Clinical Data: Screening.  MAMMOGRAPHIC UNILATERAL RIGHT DIGITAL SCREENING WITH CAD  Comparison:  Previous exams  Findings:  There are scattered fibroglandular densities. No suspicious masses, architectural distortion, or calcifications are present.  Images were processed with CAD.  IMPRESSION: No specific mammographic evidence of malignancy.  A result letter of this screening mammogram will be mailed directly to the patient.  RECOMMENDATION: Screening mammogram in one year. (Code:SM-B-01Y)  BI-RADS CATEGORY 1:  Negative  Original Report Authenticated By: DINA L. ARCEO, M.D.    ASSESSMENT: 46 y.o. [place] woman status post left breast biopsy in March 2009 for a clinical T3 N1 invasive ductal carcinoma, grade 3, strongly estrogen and progesterone receptor-positive, HER-2/neu negative, with an MIB-1 of 20%, treated neoadjuvantly with docetaxel x4 and then cyclophosphamide and doxorubicin x4.  All chemotherapy completed in August 2009.  This was followed by a left lumpectomy and axillary lymph node dissection in October 2009 for a 6.7 cm residual tumor involving 1/19 lymph nodes, grade 2.  Because of a positive margin, she underwent a left simple mastectomy in December 2009 with negative pathology.  She completed post mastectomy radiation in March 2010 and started tamoxifen at that time.  She is known to be BRCA 1 and 2 negative.   PLAN: Her cough is most likely due to the lisinopril, and we are starting that medication and starting losartan. She will have her blood pressure checked at work a couple of times next week, and is  still high we will up the dose.  Nevertheless I am concerned that her CA 27-29 is higher, and her liver function tests are newly elevated. I think she needs restaging, and we discussed that today. She will have a chest, abdomen and pelvis CT scan. She will call for those results.  On the anticipation that the results will be benign, she will see [place] again in 6 months. The plan is to continue letrozole at least until she completes 5 years of antiestrogen therapy. Given her locally advanced disease at presentation, we may want to continue antiestrogen is beyond the initial 5 years. She knows to call for any problems that may develop before the next visit.   Cam Dauphin C    05/17/2012

## 2012-05-20 ENCOUNTER — Other Ambulatory Visit: Payer: Self-pay | Admitting: Oncology

## 2012-05-24 ENCOUNTER — Other Ambulatory Visit: Payer: Self-pay | Admitting: *Deleted

## 2012-05-24 ENCOUNTER — Other Ambulatory Visit (HOSPITAL_COMMUNITY): Payer: BC Managed Care – PPO

## 2012-05-24 ENCOUNTER — Ambulatory Visit (HOSPITAL_COMMUNITY)
Admission: RE | Admit: 2012-05-24 | Discharge: 2012-05-24 | Disposition: A | Discharge status: Home / Self Care | Payer: BC Managed Care – PPO | Source: Ambulatory Visit | Attending: Oncology | Admitting: Oncology

## 2012-05-24 ENCOUNTER — Encounter (HOSPITAL_COMMUNITY): Payer: Self-pay

## 2012-05-24 DIAGNOSIS — Z9010 Acquired absence of unspecified breast and nipple: Secondary | ICD-10-CM | POA: Insufficient documentation

## 2012-05-24 DIAGNOSIS — J984 Other disorders of lung: Secondary | ICD-10-CM | POA: Insufficient documentation

## 2012-05-24 DIAGNOSIS — C50919 Malignant neoplasm of unspecified site of unspecified female breast: Secondary | ICD-10-CM | POA: Insufficient documentation

## 2012-05-24 DIAGNOSIS — K8020 Calculus of gallbladder without cholecystitis without obstruction: Secondary | ICD-10-CM | POA: Insufficient documentation

## 2012-05-24 DIAGNOSIS — R918 Other nonspecific abnormal finding of lung field: Secondary | ICD-10-CM | POA: Insufficient documentation

## 2012-05-24 MED ORDER — IOHEXOL 300 MG/ML  SOLN
125.0000 mL | Freq: Once | INTRAMUSCULAR | Status: AC | PRN
  Administered 2012-05-24: 125 mL via INTRAVENOUS

## 2012-06-01 ENCOUNTER — Ambulatory Visit (HOSPITAL_BASED_OUTPATIENT_CLINIC_OR_DEPARTMENT_OTHER): Payer: BC Managed Care – PPO | Admitting: Oncology

## 2012-06-01 VITALS — BP 104/72 | HR 99 | Temp 98.2°F | Resp 20

## 2012-06-01 DIAGNOSIS — C50919 Malignant neoplasm of unspecified site of unspecified female breast: Secondary | ICD-10-CM

## 2012-06-01 DIAGNOSIS — C773 Secondary and unspecified malignant neoplasm of axilla and upper limb lymph nodes: Secondary | ICD-10-CM

## 2012-06-01 DIAGNOSIS — Z170 Estrogen receptor positive status [ER+]: Secondary | ICD-10-CM

## 2012-06-01 NOTE — Progress Notes (Signed)
ID: Deeann Saint   DOB: [birth year]  MR#: [id number]  [id number]  HISTORY OF PRESENT ILLNESS: Melanie Holloway palpated a mass in her left breast April of 2008. She brought it to Dr. [name] attention and he set her up for mammography, which was performed 12/23/2007 at [facility]. This was her first ever mammogram and it showed a lobulated mass in the lower outer quadrant of the left breast measuring up to 15 cm. This was easily palpable. There were also enlarged lymph nodes in the left axilla. Lymph nodes in the right axilla were mildly prominent, but the right breast was otherwise unremarkable.  Ultrasound-guided biopsy was performed the same day and showed [id number] and [id number]) an invasive ductal carcinoma involving both the breast and the left axilla, ER positive at 99%, PR positive at 74%, with an MIB-1 of 20%, HER2-neu 1+. Biopsy of one of the right axillary lymph nodes showed only benign changes.  With this information, the patient was referred to Dr. [name] and as per the [facility] protocol, bilateral breast MRIs were obtained 01/02/2008. This confirmed the presence of a left breast mass measuring up to 7.1 cm by MRI with several enlarged left axillary lymph nodes. In the right axilla, lymph nodes were identified, which did not have central fatty hilum, the largest measuring 1.2 and in the right breast there was an irregular lobulated mass measuring 2.9 cm adjacent to an inframammary lymph node.  Staging studies showed no evidence of metastatic disease. The PET scan in particular showed 1 left axillary lymph node, which has an SUV of 4.4. It measured 1.9 cm. Of course, her breast mass measuring up to 7.1 cm had an uptake of 11.3, which is very hot. The only other area, which was minimally hot was an enlarged left external iliac lymph node, which had an SUV of 3.1. This just requires followup-this is not going to be related to the patient's tumor.  She had a negative  bone scan and CTs of the chest, abdomen and pelvis showed some nonspecific findings including a 2-mm right middle lobe lung nodule and slightly prominent right axillary lymph nodes without frank adenopathy, these not being hypermetabolic. There wa some cholelithiasis without cholecystitis-again, there was borderline retroperitoneal lymphadenopathy and a probably fibroid uterus on the pelvic exam. Overall, this did not show any evidence of metastatic disease, and the patient therefore remained a stage III breast cancer, with a clinical T3N1MX infiltrating ductal carcinoma, which was strongly ER/PR positive, with an MIB-1 of 20%, and HercepTest negative at 1+. Her subsequent history is as detailed below.  INTERVAL HISTORY: Melanie Holloway returns today to discuss results of her restaging studies. She got lost on the way in and arrived very late, so her visit was abbreviated.  REVIEW OF SYSTEMS: She tells me her blood pressure is being checked at work and it is "fine"--recall we switched her from lisinopril to losartan because of a dry cough she was having. The cough is "much better, about gone." She has had no fever, rash, bleeding, unexplained fatigue or weight loss, or drenching sweats. A detailed review of systems was otherwise stable  PAST MEDICAL HISTORY: Past Medical History  Diagnosis Date  . Wears glasses   . Full dentures   . Hypertension   . Obesity   . Abnormal Pap smear     8 years ago  . lt breast ca 12/2007  . Breast cancer     er/pr+, her2 -    PAST SURGICAL HISTORY:  Past Surgical History  Procedure Date  . Mastectomy 2009    LEFT  . Ankle surgery 1987  . Orif fibula fracture     left  . Tubal ligation     bilat  . Simple mastectomy 09/18/08    L    FAMILY HISTORY Family History  Problem Relation Age of Onset  . Diabetes Father   . Cancer Paternal Grandmother     breast, age 60's  . Cancer Cousin     breast    GYNECOLOGIC HISTORY: She is GX P3, first pregnancy to term  age 29, last menstrual period 12/23/2007. She is not experiencing hot flashes. Status post tubal ligation.  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 Her husband, Melanie Holloway, is a Occupational psychologist. She has a son, Melanie Holloway, who works on cars and lives in [place]; a daughter Melanie Holloway,  who lives at home; and a second daughter Melanie Holloway,  (this is the one child she shares with Melanie Holloway) also living at home. The patient attends the [employer].    ADVANCED DIRECTIVES: not in place  HEALTH MAINTENANCE: History  Substance Use Topics  . Smoking status: Never Smoker   . Smokeless tobacco: Not on file  . Alcohol Use: No     Colonoscopy:  PAP:  Bone density:  Lipid panel:  No Known Allergies  Current Outpatient Prescriptions  Medication Sig Dispense Refill  . hydrochlorothiazide (HYDRODIURIL) 25 MG tablet Take 25 mg by mouth daily.      Marland Kitchen letrozole (FEMARA) 2.5 MG tablet Take 1 tablet (2.5 mg total) by mouth daily.  30 tablet  12  . losartan (COZAAR) 25 MG tablet Take 1 tablet (25 mg total) by mouth daily.  30 tablet  12    OBJECTIVE: Middle-aged [place] American woman in no acute distress There were no vitals filed for this visit.   There is no height or weight on file to calculate BMI.    ECOG FS: 0  Sclerae unicteric Oropharynx clear No cervical or supraclavicular adenopathy Lungs no rales or rhonchi Heart regular rate and rhythm Abd benign MSK no focal spinal tenderness, no peripheral edema Neuro: nonfocal Breasts: the right axilla showed no adenopathy; breast exam was deferred  LAB RESULTS: Lab Results  Component Value Date   WBC 3.4* 05/10/2012   NEUTROABS 1.9 05/10/2012   HGB 9.8* 05/10/2012   HCT 28.9* 05/10/2012   MCV 82.7 05/10/2012   PLT 180 05/10/2012      Chemistry      Component Value Date/Time   NA 137 05/10/2012 0808   K 3.8 05/10/2012 0808   CL 101 05/10/2012 0808   CO2 26 05/10/2012 0808   BUN  22 05/10/2012 0808   CREATININE 1.06 05/10/2012 0808   CREATININE 1.15* 04/19/2012 1014      Component Value Date/Time   CALCIUM 8.8 05/10/2012 0808   ALKPHOS 114 05/10/2012 0808   AST 44* 05/10/2012 0808   ALT 47* 05/10/2012 0808   BILITOT 0.3 05/10/2012 0808       Lab Results  Component Value Date   LABCA2 41* 05/10/2012    No components found with this basename: [id number]    No results found for this basename: INR:1;PROTIME:1 in the last 168 hours  Urinalysis No results found for this basename: colorurine,  appearanceur,  labspec,  phurine,  glucoseu,  hgbur,  bilirubinur,  ketonesur,  proteinur,  urobilinogen,  nitrite,  leukocytesur  STUDIES: Dg Bone Density  05/05/2012  *RADIOLOGY REPORT*  Clinical Data: 46 year old postmenopausal female  DUAL X-RAY ABSORPTIOMETRY (DXA) FOR BONE MINERAL DENSITY  AP LUMBAR SPINE (L1-L4)  Bone Mineral Density (BMD):            1.087 g/cm2 Young Adult T Score:                          0.4 Z Score:                                                0.1  LEFT FEMUR NECK  Bone Mineral Density (BMD):             0.870 g/cm2 Young Adult T Score:                           0.2 Z Score:                                                 -0.2  ASSESSMENT:  Patient's diagnostic category is NORMAL by WHO Criteria.  FRACTURE RISK: NOT INCREASED  FRAX: World Health Organization FRAX assessment of absolute fracture risk is not calculated for this patient because the patient has a normal study.  Comparison: None  RECOMMENDATIONS:  Effective therapies are available in the form of bisphosphonates, selective estrogen receptor modulators, biologic agents, and hormone replacement therapy (for women).  All patients should ensure an adequate intake of dietary calcium (1200mg  daily) and vitamin D (800 IU daily) unless contraindicated.  All treatment decisions require clinical judgement and consideration of individual patient factors, including patient preferences, co-morbidities, previous  drug use, risk factors not captured in the FRAX model (e.g., frailty, falls, vitamin D deficiency, increased bone turnover, interval significant decline in bone density) and possible under-or over-estimation of fracture risk by FRAX.  The National Osteoporosis Foundation recommends that FDA-approved medical therapies be considered in postmenopausal women and mean age 34 or older with a:        1)     Hip or vertebral (clinical or morphometric) fracture.           2)    T-score of -2.5 or lower at the spine or hip. 3)    Ten-year fracture probability by FRAX of 3% or greater for hip fracture or 20% or greater for major osteoporotic fracture. FOLLOW-UP:  People with diagnosed cases of osteoporosis or at high risk for fracture should have regular bone mineral density tests.  For patients eligible for Medicare, routine testing is allowed once every 2 years.  The testing frequency can be increased to one year for patients who have rapidly progressing disease, those who are receiving or discontinuing medical therapy to restore bone mass, or have additional risk factors.  World Pharmacologist [facility]) Criteria:  Normal: T scores from +1.0 to -1.0 Low Bone Mass (Osteopenia): T scores between -1.0 and -2.5 Osteoporosis: T scores -2.5 and below  Comparison to Reference Population:  T score is the key measure used in the diagnosis of osteoporosis and relative risk determination for fracture.  It provides a value for bone mass relative to the mean bone mass  of a young adult reference population expressed in terms of standard deviation (SD).  Z score is the age-matched score showing the patient's values compared to a population matched for age, sex, and race.  This is also expressed in terms of standard deviation.  The patient may have values that compare favorably to the age-matched values and still be at increased risk for fracture.  Original Report Authenticated By: Jeanie Cooks. Miquel Dunn, M.D.   Mm Digital Screening Unilat  R  05/05/2012  *RADIOLOGY REPORT*  Clinical Data: Screening.  MAMMOGRAPHIC UNILATERAL RIGHT DIGITAL SCREENING WITH CAD  Comparison:  Previous exams  Findings:  There are scattered fibroglandular densities. No suspicious masses, architectural distortion, or calcifications are present.  Images were processed with CAD.  IMPRESSION: No specific mammographic evidence of malignancy.  A result letter of this screening mammogram will be mailed directly to the patient.  RECOMMENDATION: Screening mammogram in one year. (Code:SM-B-01Y)  BI-RADS CATEGORY 1:  Negative  Original Report Authenticated By: DINA L. Miquel Dunn, M.D.   Ct Abdomen Pelvis W Wo Contrast  05/24/2012  *RADIOLOGY REPORT*  Clinical Data:  History of breast cancer  CT CHEST WITH CONTRAST  Technique:  Multidetector CT imaging of the chest was performed dur ing intravenous contrast   administration.  Contrast: 168mL OMNIPAQUE IOHEXOL 300 MG/ML  SOLN  Comparison:  10/15/2009  Findings:  Prior left mastectomy and left axillary nodal dissection.  Prominent right axillary lymph nodes are identified and appear increased from previous exam.  Index lymph node in the right axilla measures 1 cm, image 37. This is new from previous exam.  Prominent right sided retropectoral lymph nodes are also new from previous exam.  Index node within the right retropectoral region measures 1 cm, image 18. Also new from previous exam.  New right hilar lymph node measures 1.2 cm, image 47.  No left hilar adenopathy.  No pericardial or pleural effusion identified.  Subpleural scarring within the anterior left upper lobe is identified.  This is likely related to external beam radiation. Tiny nodule within the right upper lobe measures 2-3 mm, image 25. This is unchanged from previous exam.  Also in the right upper lobe there is a 4.5 mm nodule, image 23.  Stable from previous exam.  Review of the visualized osseous structures shows no aggressive lytic or sclerotic lesions.  IMPRESSION:  1.  Interval increase in size of right axillary, right subpectoral, and right hilar lymph nodes, which are borderline enlarged.  *RADIOLOGY REPORT*  CT ABDOMEN WITHOUT AND WITH CONTRAST CT PELVIS WITH CONTRAST  Technique:  Multidetector CT imaging of the abdomen was performed initially following the standard protocol before administration of intravenous contrast.  Multidetector CT imaging of the abdomen and pelvis was then performed following the standard protocol during the bolus injection of intravenous contrast.  Contrast: 173mL OMNIPAQUE IOHEXOL 300 MG/ML  SOLN  Comparison:  CT chest from 10/15/2009 and CT abdomen pelvis from 01/12/2008  Findings:  No suspicious liver abnormalities.  Multiple gallstones are identified within the lumen of the gallbladder.  No pericholecystic fluid or inflammatory change noted.  Common bile duct is normal in caliber.  The pancreas appears within normal limits.  Normal appearance of the spleen.  Both of the adrenal glands are normal.  The right kidney is normal. The left kidney is normal.  Urinary bladder is unremarkable. Uterus and adnexal structures are negative.  Prominent retroperitoneal lymph nodes are identified.  Left sided periaortic lymph node measures 1.1 cm, image 102.  Previously  this measured 1 cm.  At a slightly lower level there is a 1 cm periaortic lymph node, image 109.  Previously this measured 0.9 cm. 0.9 cm right common iliac lymph node is identified, image 120. Previously 1.0 cm.  Right external iliac lymph node measures 1.5 cm, image 157.  Previously 1 cm.  No inguinal adenopathy identified.  The stomach is within normal limits.  The small bowel loops have a normal course and caliber.  Normal appearance of the colon.  No ascites.  Review of the visualized osseous structures shows no aggressive lytic or sclerotic bone lesions.  IMPRESSION:  1.  Prominent abdominal and pelvic lymph nodes are identified which are borderline enlarged.  These are stable to slightly  increased in the interval. 2.  No evidence for liver metastasis.  Original Report Authenticated By: Angelita Ingles, M.D.   Ct Chest W Contrast  05/24/2012  *RADIOLOGY REPORT*  Clinical Data:  History of breast cancer  CT CHEST WITH CONTRAST  Technique:  Multidetector CT imaging of the chest was performed dur ing intravenous contrast   administration.  Contrast: 193mL OMNIPAQUE IOHEXOL 300 MG/ML  SOLN  Comparison:  10/15/2009  Findings:  Prior left mastectomy and left axillary nodal dissection.  Prominent right axillary lymph nodes are identified and appear increased from previous exam.  Index lymph node in the right axilla measures 1 cm, image 37. This is new from previous exam.  Prominent right sided retropectoral lymph nodes are also new from previous exam.  Index node within the right retropectoral region measures 1 cm, image 18. Also new from previous exam.  New right hilar lymph node measures 1.2 cm, image 47.  No left hilar adenopathy.  No pericardial or pleural effusion identified.  Subpleural scarring within the anterior left upper lobe is identified.  This is likely related to external beam radiation. Tiny nodule within the right upper lobe measures 2-3 mm, image 25. This is unchanged from previous exam.  Also in the right upper lobe there is a 4.5 mm nodule, image 23.  Stable from previous exam.  Review of the visualized osseous structures shows no aggressive lytic or sclerotic lesions.  IMPRESSION:  1. Interval increase in size of right axillary, right subpectoral, and right hilar lymph nodes, which are borderline enlarged.  *RADIOLOGY REPORT*  CT ABDOMEN WITHOUT AND WITH CONTRAST CT PELVIS WITH CONTRAST  Technique:  Multidetector CT imaging of the abdomen was performed initially following the standard protocol before administration of intravenous contrast.  Multidetector CT imaging of the abdomen and pelvis was then performed following the standard protocol during the bolus injection of intravenous  contrast.  Contrast: 170mL OMNIPAQUE IOHEXOL 300 MG/ML  SOLN  Comparison:  CT chest from 10/15/2009 and CT abdomen pelvis from 01/12/2008  Findings:  No suspicious liver abnormalities.  Multiple gallstones are identified within the lumen of the gallbladder.  No pericholecystic fluid or inflammatory change noted.  Common bile duct is normal in caliber.  The pancreas appears within normal limits.  Normal appearance of the spleen.  Both of the adrenal glands are normal.  The right kidney is normal. The left kidney is normal.  Urinary bladder is unremarkable. Uterus and adnexal structures are negative.  Prominent retroperitoneal lymph nodes are identified.  Left sided periaortic lymph node measures 1.1 cm, image 102.  Previously this measured 1 cm.  At a slightly lower level there is a 1 cm periaortic lymph node, image 109.  Previously this measured 0.9 cm. 0.9 cm right  common iliac lymph node is identified, image 120. Previously 1.0 cm.  Right external iliac lymph node measures 1.5 cm, image 157.  Previously 1 cm.  No inguinal adenopathy identified.  The stomach is within normal limits.  The small bowel loops have a normal course and caliber.  Normal appearance of the colon.  No ascites.  Review of the visualized osseous structures shows no aggressive lytic or sclerotic bone lesions.  IMPRESSION:  1.  Prominent abdominal and pelvic lymph nodes are identified which are borderline enlarged.  These are stable to slightly increased in the interval. 2.  No evidence for liver metastasis.  Original Report Authenticated By: Angelita Ingles, M.D.   Dg Bone Density  05/05/2012  *RADIOLOGY REPORT*  Clinical Data: 46 year old postmenopausal female  DUAL X-RAY ABSORPTIOMETRY (DXA) FOR BONE MINERAL DENSITY  AP LUMBAR SPINE (L1-L4)  Bone Mineral Density (BMD):            1.087 g/cm2 Young Adult T Score:                          0.4 Z Score:                                                0.1  LEFT FEMUR NECK  Bone Mineral Density  (BMD):             0.870 g/cm2 Young Adult T Score:                           0.2 Z Score:                                                 -0.2  ASSESSMENT:  Patient's diagnostic category is NORMAL by WHO Criteria.  FRACTURE RISK: NOT INCREASED  FRAX: World Health Organization FRAX assessment of absolute fracture risk is not calculated for this patient because the patient has a normal study.  Comparison: None  RECOMMENDATIONS:  Effective therapies are available in the form of bisphosphonates, selective estrogen receptor modulators, biologic agents, and hormone replacement therapy (for women).  All patients should ensure an adequate intake of dietary calcium (1200mg  daily) and vitamin D (800 IU daily) unless contraindicated.  All treatment decisions require clinical judgement and consideration of individual patient factors, including patient preferences, co-morbidities, previous drug use, risk factors not captured in the FRAX model (e.g., frailty, falls, vitamin D deficiency, increased bone turnover, interval significant decline in bone density) and possible under-or over-estimation of fracture risk by FRAX.  The National Osteoporosis Foundation recommends that FDA-approved medical therapies be considered in postmenopausal women and mean age 20 or older with a:        1)     Hip or vertebral (clinical or morphometric) fracture.           2)    T-score of -2.5 or lower at the spine or hip. 3)    Ten-year fracture probability by FRAX of 3% or greater for hip fracture or 20% or greater for major osteoporotic fracture. FOLLOW-UP:  People with diagnosed cases of osteoporosis or at high risk for fracture should have regular bone mineral density  tests.  For patients eligible for Medicare, routine testing is allowed once every 2 years.  The testing frequency can be increased to one year for patients who have rapidly progressing disease, those who are receiving or discontinuing medical therapy to restore bone mass, or have  additional risk factors.  World Pharmacologist [facility]) Criteria:  Normal: T scores from +1.0 to -1.0 Low Bone Mass (Osteopenia): T scores between -1.0 and -2.5 Osteoporosis: T scores -2.5 and below  Comparison to Reference Population:  T score is the key measure used in the diagnosis of osteoporosis and relative risk determination for fracture.  It provides a value for bone mass relative to the mean bone mass of a young adult reference population expressed in terms of standard deviation (SD).  Z score is the age-matched score showing the patient's values compared to a population matched for age, sex, and race.  This is also expressed in terms of standard deviation.  The patient may have values that compare favorably to the age-matched values and still be at increased risk for fracture.  Original Report Authenticated By: Jeanie Cooks. Miquel Dunn, M.D.   Mm Digital Screening Unilat R  05/05/2012  *RADIOLOGY REPORT*  Clinical Data: Screening.  MAMMOGRAPHIC UNILATERAL RIGHT DIGITAL SCREENING WITH CAD  Comparison:  Previous exams  Findings:  There are scattered fibroglandular densities. No suspicious masses, architectural distortion, or calcifications are present.  Images were processed with CAD.  IMPRESSION: No specific mammographic evidence of malignancy.  A result letter of this screening mammogram will be mailed directly to the patient.  RECOMMENDATION: Screening mammogram in one year. (Code:SM-B-01Y)  BI-RADS CATEGORY 1:  Negative  Original Report Authenticated By: DINA L. ARCEO, M.D.   ASSESSMENT: 46 y.o. [place] woman status post left breast biopsy in March 2009 for a clinical T3 N1, stage IIIA invasive ductal carcinoma, grade 3, strongly estrogen and progesterone receptor-positive, HER-2/neu negative, with an MIB-1 of 20%, treated neoadjuvantly with docetaxel x4 and then cyclophosphamide and doxorubicin x4.  All chemotherapy completed in August 2009.  This was followed by a left lumpectomy and axillary lymph node  dissection in October 2009 for a 6.7 cm residual tumor involving 1/19 lymph nodes, grade 2.  Because of a positive margin, she underwent a left simple mastectomy in December 2009 with negative pathology.  She completed post mastectomy radiation in March 2010 and started tamoxifen at that time.  She is known to be BRCA 1 and 2 negative.   PLAN: We discussed the results of her CT scans of the chest/ abdomen and pelvis in detail. She has mild adenopathy in the Right chest and retroperitoneum. Her cancer of course was on the Left side. We obtained the scans specifically to evaluate for possible liver or bone spread but there is no evidence of either. Accordingly the mild adenopathy is nonspecific. We discussed PET scan, biopsy, or follow-up. Because there is no palpable adenopathy, biopsy would be involved. A Pet scan is unlikely to resolve the issue--we would still have to do follow-up scans.  Accordingly we decided we would repeat a CT scan of the chest in early December. She will see me shortly after that to discuss results. We will also repeat a CA 27-29 before that visit. Wilburta has a good understanding of this pln and is in agreement. She knows to call if any problems develop before her next scheduled visit here  Windom C    06/01/2012

## 2012-06-02 ENCOUNTER — Telehealth: Payer: Self-pay | Admitting: *Deleted

## 2012-06-02 NOTE — Telephone Encounter (Signed)
Made patient appointment for the ct scan on 09-19-2012 arrival time 8:15am  md requeted that the patient see him in december instead of the first of the year mailed out calendar to inform the patient of the new date and time

## 2012-06-09 ENCOUNTER — Other Ambulatory Visit: Payer: Self-pay | Admitting: Oncology

## 2012-06-27 ENCOUNTER — Ambulatory Visit (INDEPENDENT_AMBULATORY_CARE_PROVIDER_SITE_OTHER): Payer: BC Managed Care – PPO | Admitting: Family Medicine

## 2012-06-27 ENCOUNTER — Telehealth: Payer: Self-pay | Admitting: Family Medicine

## 2012-06-27 ENCOUNTER — Encounter: Payer: Self-pay | Admitting: Family Medicine

## 2012-06-27 VITALS — BP 112/72 | HR 72 | Temp 98.1°F | Ht <= 58 in | Wt 181.2 lb

## 2012-06-27 DIAGNOSIS — D649 Anemia, unspecified: Secondary | ICD-10-CM

## 2012-06-27 DIAGNOSIS — I10 Essential (primary) hypertension: Secondary | ICD-10-CM

## 2012-06-27 DIAGNOSIS — C50919 Malignant neoplasm of unspecified site of unspecified female breast: Secondary | ICD-10-CM

## 2012-06-27 DIAGNOSIS — E669 Obesity, unspecified: Secondary | ICD-10-CM

## 2012-06-27 DIAGNOSIS — N184 Chronic kidney disease, stage 4 (severe): Secondary | ICD-10-CM | POA: Insufficient documentation

## 2012-06-27 DIAGNOSIS — E559 Vitamin D deficiency, unspecified: Secondary | ICD-10-CM

## 2012-06-27 DIAGNOSIS — R7402 Elevation of levels of lactic acid dehydrogenase (LDH): Secondary | ICD-10-CM

## 2012-06-27 MED ORDER — ERGOCALCIFEROL 1.25 MG (50000 UT) PO CAPS: 50000.0000 [IU] | ORAL_CAPSULE | ORAL | Status: DC

## 2012-06-27 NOTE — Assessment & Plan Note (Signed)
Followed every 6 mo with Dr. Jana Hakim.

## 2012-06-27 NOTE — Assessment & Plan Note (Signed)
I recommended starting weekly supplement - sent to pharmacy.

## 2012-06-27 NOTE — Patient Instructions (Signed)
Good to see you today, call us with questions. Return as needed. Return in 3 months for labwork to recheck blood count, liver, and iron stores.

## 2012-06-27 NOTE — Assessment & Plan Note (Signed)
Very mild. Recheck in 3 mo.  Denies tylenol or EtOH use.

## 2012-06-27 NOTE — Telephone Encounter (Signed)
I forgot to write in pt instructions - we discussed vit D deficiency - I recommend starting weekly supplement for 3 mo - sent to pharmacy

## 2012-06-27 NOTE — Assessment & Plan Note (Signed)
Per pt longstanding.   Check iron panel next visit

## 2012-06-27 NOTE — Assessment & Plan Note (Signed)
Does regularly walk. Body mass index is 38.54 kg/(m^2).
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
## 2012-06-27 NOTE — Assessment & Plan Note (Signed)
Chronic, well controlled on current regimen. Continue.

## 2012-06-28 NOTE — Telephone Encounter (Signed)
Pt left v/m requesting call back [phone].

## 2012-06-28 NOTE — Telephone Encounter (Signed)
Left message for pt to call office

## 2012-06-28 NOTE — Telephone Encounter (Signed)
Notified pt, pt was aware and already picked up med from pharm.

## 2012-07-05 ENCOUNTER — Other Ambulatory Visit: Payer: Self-pay | Admitting: *Deleted

## 2012-07-05 ENCOUNTER — Other Ambulatory Visit: Payer: Self-pay | Admitting: Oncology

## 2012-07-05 DIAGNOSIS — C50912 Malignant neoplasm of unspecified site of left female breast: Secondary | ICD-10-CM

## 2012-07-05 MED ORDER — HYDROCHLOROTHIAZIDE 25 MG PO TABS: 25.0000 mg | ORAL_TABLET | Freq: Every day | ORAL | Status: DC

## 2012-07-05 NOTE — Telephone Encounter (Signed)
Patient now has established with a primary care MD, Dr Ria Bush. Refill request sent to [place] for HCTZ for hypertension management. Called and notified pharmacy to request refill on this med from primary MD at this point. Notified patient of change, informed her that if there was a problem getting this filled, to call us back.

## 2012-07-06 ENCOUNTER — Other Ambulatory Visit: Payer: Self-pay | Admitting: Oncology

## 2012-07-06 DIAGNOSIS — C50919 Malignant neoplasm of unspecified site of unspecified female breast: Secondary | ICD-10-CM

## 2012-09-13 ENCOUNTER — Other Ambulatory Visit (HOSPITAL_BASED_OUTPATIENT_CLINIC_OR_DEPARTMENT_OTHER): Payer: BC Managed Care – PPO | Admitting: Lab

## 2012-09-13 DIAGNOSIS — C50519 Malignant neoplasm of lower-outer quadrant of unspecified female breast: Secondary | ICD-10-CM

## 2012-09-13 DIAGNOSIS — C50919 Malignant neoplasm of unspecified site of unspecified female breast: Secondary | ICD-10-CM

## 2012-09-13 LAB — COMPREHENSIVE METABOLIC PANEL (CC13)
ALT: 81 U/L — ABNORMAL HIGH (ref 0–55)
AST: 56 U/L — ABNORMAL HIGH (ref 5–34)
Alkaline Phosphatase: 127 U/L (ref 40–150)
Potassium: 3.5 mEq/L (ref 3.5–5.1)
Sodium: 137 mEq/L (ref 136–145)
Total Bilirubin: 0.33 mg/dL (ref 0.20–1.20)
Total Protein: 7.8 g/dL (ref 6.4–8.3)

## 2012-09-13 LAB — CANCER ANTIGEN 27.29: CA 27.29: 44 U/mL — ABNORMAL HIGH (ref 0–39)

## 2012-09-13 LAB — CBC WITH DIFFERENTIAL/PLATELET
EOS%: 3.2 % (ref 0.0–7.0)
Eosinophils Absolute: 0.1 10*3/uL (ref 0.0–0.5)
MCH: 28.2 pg (ref 25.1–34.0)
MCV: 82.1 fL (ref 79.5–101.0)
MONO%: 8.9 % (ref 0.0–14.0)
NEUT#: 2.5 10*3/uL (ref 1.5–6.5)
RBC: 3.76 10*6/uL (ref 3.70–5.45)
RDW: 13.5 % (ref 11.2–14.5)
lymph#: 1.1 10*3/uL (ref 0.9–3.3)

## 2012-09-19 ENCOUNTER — Ambulatory Visit (HOSPITAL_COMMUNITY)
Admission: RE | Admit: 2012-09-19 | Discharge: 2012-09-19 | Disposition: A | Discharge status: Home / Self Care | Payer: BC Managed Care – PPO | Source: Ambulatory Visit | Attending: Oncology | Admitting: Oncology

## 2012-09-19 DIAGNOSIS — Z923 Personal history of irradiation: Secondary | ICD-10-CM | POA: Insufficient documentation

## 2012-09-19 DIAGNOSIS — C50919 Malignant neoplasm of unspecified site of unspecified female breast: Secondary | ICD-10-CM | POA: Insufficient documentation

## 2012-09-19 DIAGNOSIS — Z9221 Personal history of antineoplastic chemotherapy: Secondary | ICD-10-CM | POA: Insufficient documentation

## 2012-09-19 MED ORDER — IOHEXOL 300 MG/ML  SOLN
80.0000 mL | Freq: Once | INTRAMUSCULAR | Status: AC | PRN
  Administered 2012-09-19: 80 mL via INTRAVENOUS

## 2012-09-20 ENCOUNTER — Other Ambulatory Visit: Payer: Self-pay | Admitting: Family Medicine

## 2012-09-21 ENCOUNTER — Ambulatory Visit (HOSPITAL_BASED_OUTPATIENT_CLINIC_OR_DEPARTMENT_OTHER): Payer: BC Managed Care – PPO | Admitting: Oncology

## 2012-09-21 ENCOUNTER — Other Ambulatory Visit: Payer: BC Managed Care – PPO | Admitting: Lab

## 2012-09-21 ENCOUNTER — Ambulatory Visit: Payer: BC Managed Care – PPO | Admitting: Oncology

## 2012-09-21 ENCOUNTER — Telehealth: Payer: Self-pay | Admitting: Oncology

## 2012-09-21 VITALS — BP 123/80 | HR 91 | Temp 97.0°F | Resp 20 | Ht <= 58 in | Wt 187.1 lb

## 2012-09-21 DIAGNOSIS — C50919 Malignant neoplasm of unspecified site of unspecified female breast: Secondary | ICD-10-CM

## 2012-09-21 DIAGNOSIS — D649 Anemia, unspecified: Secondary | ICD-10-CM

## 2012-09-21 DIAGNOSIS — C773 Secondary and unspecified malignant neoplasm of axilla and upper limb lymph nodes: Secondary | ICD-10-CM

## 2012-09-21 MED ORDER — LETROZOLE 2.5 MG PO TABS: 2.5000 mg | ORAL_TABLET | Freq: Every day | ORAL | Status: DC
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
## 2012-09-21 NOTE — Telephone Encounter (Signed)
gve the pt her march,april 2014 appt calendar

## 2012-09-26 ENCOUNTER — Encounter: Payer: Self-pay | Admitting: Family Medicine

## 2012-09-26 ENCOUNTER — Ambulatory Visit (INDEPENDENT_AMBULATORY_CARE_PROVIDER_SITE_OTHER): Payer: BC Managed Care – PPO | Admitting: Family Medicine

## 2012-09-26 ENCOUNTER — Other Ambulatory Visit (INDEPENDENT_AMBULATORY_CARE_PROVIDER_SITE_OTHER): Payer: BC Managed Care – PPO

## 2012-09-26 VITALS — BP 104/64 | HR 80 | Temp 98.2°F | Wt 184.2 lb

## 2012-09-26 DIAGNOSIS — L930 Discoid lupus erythematosus: Secondary | ICD-10-CM | POA: Insufficient documentation

## 2012-09-26 DIAGNOSIS — E559 Vitamin D deficiency, unspecified: Secondary | ICD-10-CM

## 2012-09-26 DIAGNOSIS — D649 Anemia, unspecified: Secondary | ICD-10-CM

## 2012-09-26 DIAGNOSIS — R21 Rash and other nonspecific skin eruption: Secondary | ICD-10-CM

## 2012-09-26 LAB — TSH: TSH: 2.07 u[IU]/mL (ref 0.35–5.50)

## 2012-09-26 LAB — FERRITIN: Ferritin: 218.9 ng/mL (ref 10.0–291.0)

## 2012-09-26 MED ORDER — TRIAMCINOLONE ACETONIDE 0.1 % EX CREA: TOPICAL_CREAM | Freq: Two times a day (BID) | CUTANEOUS | Status: DC

## 2012-09-26 NOTE — Patient Instructions (Addendum)
Blood work today. For skin rash - try triamcinolone cream once daily for 1-2 weeks.  This will help itch, we will see if it helps resolve spots on skin.  If not helping, stop steroid and return here for biopsy or let us know if you prefer dermatology referral for further evaluation.  Continue aquaphor. For between toes - use OTC lotrimin cream twice daily for 3-4 weeks.

## 2012-09-26 NOTE — Progress Notes (Signed)
  Subjective:    Patient ID: Melanie Holloway, female    DOB: Dec 03, 1965, 46 y.o.   MRN: [id number]  HPI CC: rash on legs  Spots on legs - hyperpigmented macules on legs. Present for about 6 mo.  Sometimes itchy. aquaphor use helps.  Thinks started as bruises from shoes.  Now seem to be growing.  None on thighs.   Review of Systems Per HPI    Objective:   Physical Exam  Skin: Rash noted.          BLE - macular patches of hyperpigmented rash along with rough dry skin and slight lichenification. Macerated toes in interdigital areas bilaterally.       Assessment & Plan:

## 2012-09-26 NOTE — Assessment & Plan Note (Addendum)
Lower extremity rash - anticipate eczematoid reaction with lichenification.  Treat with mid potency topical steroid.  Continue moisturizing.  If not better with this, discussed d/c steroid and wait 1-2 wks then return for skin biopsy or to let us know if desires referral to derm (r/o other cause like mycosis). For tinea pedis - rec clotrimazole, discussed home care and importance of clean and dry skin.

## 2012-09-26 NOTE — Addendum Note (Signed)
Addended by: Ria Bush on: 09/26/2012 08:50 AM   Modules accepted: Orders

## 2012-09-27 LAB — VITAMIN D 25 HYDROXY (VIT D DEFICIENCY, FRACTURES): Vit D, 25-Hydroxy: 40 ng/mL (ref 30–89)

## 2012-09-29 ENCOUNTER — Encounter: Payer: Self-pay | Admitting: *Deleted

## 2012-09-29 ENCOUNTER — Other Ambulatory Visit: Payer: Self-pay | Admitting: Family Medicine

## 2012-09-29 MED ORDER — VITAMIN D 50 MCG (2000 UT) PO CAPS: 1.0000 | ORAL_CAPSULE | Freq: Every day | ORAL | Status: DC

## 2012-10-08 ENCOUNTER — Other Ambulatory Visit: Payer: Self-pay | Admitting: Oncology

## 2012-10-25 ENCOUNTER — Other Ambulatory Visit: Payer: Self-pay | Admitting: Family Medicine

## 2012-10-25 NOTE — Telephone Encounter (Signed)
Spoke with patient and she said that she has had a lot of improvement with the triamcinolone and would like a refill. She doesn't think she needs the bx or referral at this time. Ok to refill?

## 2012-10-25 NOTE — Telephone Encounter (Signed)
Refilled.  Remind patient to do steroid skin holiday - no more than 2-3 wks at a time of continuous steroid use.

## 2012-10-25 NOTE — Telephone Encounter (Signed)
Patient notified and will take a break before restarting ointment.

## 2012-10-25 NOTE — Telephone Encounter (Signed)
Message left for patient to return my call and advise about refill. Dr. Darnell Level had mentioned skin bx and/or derm referral if no better.

## 2012-11-06 ENCOUNTER — Other Ambulatory Visit: Payer: Self-pay | Admitting: Family Medicine

## 2012-11-17 ENCOUNTER — Ambulatory Visit: Payer: BC Managed Care – PPO | Admitting: Physician Assistant

## 2012-11-17 ENCOUNTER — Other Ambulatory Visit: Payer: BC Managed Care – PPO | Admitting: Lab

## 2012-12-05 ENCOUNTER — Other Ambulatory Visit: Payer: Self-pay | Admitting: Family Medicine

## 2012-12-05 DIAGNOSIS — Z0000 Encounter for general adult medical examination without abnormal findings: Secondary | ICD-10-CM

## 2013-01-15 ENCOUNTER — Other Ambulatory Visit: Payer: Self-pay | Admitting: Family Medicine

## 2013-01-16 ENCOUNTER — Other Ambulatory Visit (HOSPITAL_BASED_OUTPATIENT_CLINIC_OR_DEPARTMENT_OTHER): Payer: BC Managed Care – PPO | Admitting: Lab

## 2013-01-16 DIAGNOSIS — C773 Secondary and unspecified malignant neoplasm of axilla and upper limb lymph nodes: Secondary | ICD-10-CM

## 2013-01-16 DIAGNOSIS — C50919 Malignant neoplasm of unspecified site of unspecified female breast: Secondary | ICD-10-CM

## 2013-01-16 LAB — CBC WITH DIFFERENTIAL/PLATELET
BASO%: 0.4 % (ref 0.0–2.0)
HCT: 30.4 % — ABNORMAL LOW (ref 34.8–46.6)
MCHC: 34.9 g/dL (ref 31.5–36.0)
MONO#: 0.4 10*3/uL (ref 0.1–0.9)
NEUT%: 54.5 % (ref 38.4–76.8)
RBC: 3.78 10*6/uL (ref 3.70–5.45)
WBC: 4.1 10*3/uL (ref 3.9–10.3)
lymph#: 1.3 10*3/uL (ref 0.9–3.3)

## 2013-01-16 LAB — ANGIOTENSIN CONVERTING ENZYME: Angiotensin 1 CE: 47 U/L (ref 8–52)

## 2013-01-16 LAB — COMPREHENSIVE METABOLIC PANEL (CC13)
ALT: 21 U/L (ref 0–55)
CO2: 22 mEq/L (ref 22–29)
Calcium: 9.3 mg/dL (ref 8.4–10.4)
Chloride: 103 mEq/L (ref 98–107)
Sodium: 138 mEq/L (ref 136–145)
Total Protein: 7.8 g/dL (ref 6.4–8.3)

## 2013-01-23 ENCOUNTER — Encounter: Payer: Self-pay | Admitting: Physician Assistant

## 2013-01-23 ENCOUNTER — Telehealth: Payer: Self-pay | Admitting: Oncology

## 2013-01-23 ENCOUNTER — Ambulatory Visit (HOSPITAL_BASED_OUTPATIENT_CLINIC_OR_DEPARTMENT_OTHER): Payer: BC Managed Care – PPO | Admitting: Physician Assistant

## 2013-01-23 VITALS — BP 114/81 | HR 98 | Temp 98.3°F | Resp 20 | Ht <= 58 in | Wt 190.1 lb

## 2013-01-23 DIAGNOSIS — R7989 Other specified abnormal findings of blood chemistry: Secondary | ICD-10-CM

## 2013-01-23 DIAGNOSIS — Z170 Estrogen receptor positive status [ER+]: Secondary | ICD-10-CM

## 2013-01-23 DIAGNOSIS — C50912 Malignant neoplasm of unspecified site of left female breast: Secondary | ICD-10-CM

## 2013-01-23 DIAGNOSIS — E876 Hypokalemia: Secondary | ICD-10-CM

## 2013-01-23 DIAGNOSIS — C50519 Malignant neoplasm of lower-outer quadrant of unspecified female breast: Secondary | ICD-10-CM

## 2013-01-23 DIAGNOSIS — C50919 Malignant neoplasm of unspecified site of unspecified female breast: Secondary | ICD-10-CM

## 2013-01-23 DIAGNOSIS — C773 Secondary and unspecified malignant neoplasm of axilla and upper limb lymph nodes: Secondary | ICD-10-CM

## 2013-01-23 DIAGNOSIS — Z1231 Encounter for screening mammogram for malignant neoplasm of breast: Secondary | ICD-10-CM

## 2013-01-23 MED ORDER — LETROZOLE 2.5 MG PO TABS: 2.5000 mg | ORAL_TABLET | Freq: Every day | ORAL | Status: DC

## 2013-01-23 MED ORDER — POTASSIUM CHLORIDE ER 10 MEQ PO CPCR: 10.0000 meq | ORAL_CAPSULE | Freq: Two times a day (BID) | ORAL | Status: DC
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
## 2013-01-23 NOTE — Telephone Encounter (Signed)
gv pt appt schedule for September and mammo for July.

## 2013-05-08 ENCOUNTER — Ambulatory Visit
Admission: RE | Admit: 2013-05-08 | Discharge: 2013-05-08 | Disposition: A | Discharge status: Home / Self Care | Payer: Managed Care, Other (non HMO) | Source: Ambulatory Visit | Attending: Physician Assistant | Admitting: Physician Assistant

## 2013-05-08 ENCOUNTER — Ambulatory Visit: Payer: BC Managed Care – PPO

## 2013-05-08 DIAGNOSIS — Z1231 Encounter for screening mammogram for malignant neoplasm of breast: Secondary | ICD-10-CM

## 2013-06-09 ENCOUNTER — Other Ambulatory Visit: Payer: Self-pay | Admitting: Oncology

## 2013-06-09 DIAGNOSIS — C50919 Malignant neoplasm of unspecified site of unspecified female breast: Secondary | ICD-10-CM

## 2013-06-10 ENCOUNTER — Other Ambulatory Visit: Payer: Self-pay | Admitting: Oncology

## 2013-06-16 ENCOUNTER — Encounter: Payer: Self-pay | Admitting: Family Medicine

## 2013-06-16 ENCOUNTER — Ambulatory Visit (INDEPENDENT_AMBULATORY_CARE_PROVIDER_SITE_OTHER): Payer: Managed Care, Other (non HMO) | Admitting: Family Medicine

## 2013-06-16 VITALS — BP 110/80 | HR 104 | Temp 98.3°F | Ht <= 58 in | Wt 189.0 lb

## 2013-06-16 DIAGNOSIS — R1013 Epigastric pain: Secondary | ICD-10-CM | POA: Insufficient documentation

## 2013-06-16 LAB — COMPREHENSIVE METABOLIC PANEL
Alkaline Phosphatase: 92 U/L (ref 39–117)
BUN: 19 mg/dL (ref 6–23)
Creatinine, Ser: 1 mg/dL (ref 0.4–1.2)
Glucose, Bld: 119 mg/dL — ABNORMAL HIGH (ref 70–99)
Sodium: 136 mEq/L (ref 135–145)
Total Bilirubin: 0.5 mg/dL (ref 0.3–1.2)

## 2013-06-16 LAB — CBC WITH DIFFERENTIAL/PLATELET
Basophils Relative: 0.5 % (ref 0.0–3.0)
Eosinophils Relative: 2.6 % (ref 0.0–5.0)
HCT: 29.7 % — ABNORMAL LOW (ref 36.0–46.0)
Hemoglobin: 10.1 g/dL — ABNORMAL LOW (ref 12.0–15.0)
Lymphs Abs: 1.2 10*3/uL (ref 0.7–4.0)
MCV: 82.6 fl (ref 78.0–100.0)
Monocytes Absolute: 0.3 10*3/uL (ref 0.1–1.0)
RBC: 3.6 Mil/uL — ABNORMAL LOW (ref 3.87–5.11)
WBC: 3.1 10*3/uL — ABNORMAL LOW (ref 4.5–10.5)

## 2013-06-16 MED ORDER — OMEPRAZOLE 40 MG PO CPDR: 40.0000 mg | DELAYED_RELEASE_CAPSULE | Freq: Every day | ORAL | Status: DC
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
## 2013-06-16 NOTE — Assessment & Plan Note (Addendum)
Anticipate GERD, but along with vomiting and radiation to back, check lipase to eval pancreas. Treat with omeprazole 40mg  daily for 3 weeks then as needed. GERD precautions discussed - see pt instructions. Recheck next week at CPE. If no improvement noted, consider GI referral.

## 2013-06-16 NOTE — Patient Instructions (Addendum)
We'll recheck next week how you're doing. Let's start omeprazole 40mg  daily - I think yo uhave GERD or gastroesophageal reflux disease. Head of bed elevated. Avoidance of citrus, fatty foods, chocolate, peppermint, and excessive alcohol, along with sodas, orange juice (acidic drinks) At least a few hours between dinner and bed, minimize naps after eating. Let us know if not better with this. Gastroesophageal Reflux Disease, Adult Gastroesophageal reflux disease (GERD) happens when acid from your stomach flows up into the esophagus. When acid comes in contact with the esophagus, the acid causes soreness (inflammation) in the esophagus. Over time, GERD may create small holes (ulcers) in the lining of the esophagus. CAUSES   Increased body weight. This puts pressure on the stomach, making acid rise from the stomach into the esophagus.  Smoking. This increases acid production in the stomach.  Drinking alcohol. This causes decreased pressure in the lower esophageal sphincter (valve or ring of muscle between the esophagus and stomach), allowing acid from the stomach into the esophagus.  Late evening meals and a full stomach. This increases pressure and acid production in the stomach.  A malformed lower esophageal sphincter. Sometimes, no cause is found. SYMPTOMS   Burning pain in the lower part of the mid-chest behind the breastbone and in the mid-stomach area. This may occur twice a week or more often.  Trouble swallowing.  Sore throat.  Dry cough.  Asthma-like symptoms including chest tightness, shortness of breath, or wheezing. DIAGNOSIS  Your caregiver may be able to diagnose GERD based on your symptoms. In some cases, X-rays and other tests may be done to check for complications or to check the condition of your stomach and esophagus. TREATMENT  Your caregiver may recommend over-the-counter or prescription medicines to help decrease acid production. Ask your caregiver before starting  or adding any new medicines.  HOME CARE INSTRUCTIONS   Change the factors that you can control. Ask your caregiver for guidance concerning weight loss, quitting smoking, and alcohol consumption.  Avoid foods and drinks that make your symptoms worse, such as:  Caffeine or alcoholic drinks.  Chocolate.  Peppermint or mint flavorings.  Garlic and onions.  Spicy foods.  Citrus fruits, such as oranges, lemons, or limes.  Tomato-based foods such as sauce, chili, salsa, and pizza.  Fried and fatty foods.  Avoid lying down for the 3 hours prior to your bedtime or prior to taking a nap.  Eat small, frequent meals instead of large meals.  Wear loose-fitting clothing. Do not wear anything tight around your waist that causes pressure on your stomach.  Raise the head of your bed 6 to 8 inches with wood blocks to help you sleep. Extra pillows will not help.  Only take over-the-counter or prescription medicines for pain, discomfort, or fever as directed by your caregiver.  Do not take aspirin, ibuprofen, or other nonsteroidal anti-inflammatory drugs (NSAIDs). SEEK IMMEDIATE MEDICAL CARE IF:   You have pain in your arms, neck, jaw, teeth, or back.  Your pain increases or changes in intensity or duration.  You develop nausea, vomiting, or sweating (diaphoresis).  You develop shortness of breath, or you faint.  Your vomit is green, yellow, black, or looks like coffee grounds or blood.  Your stool is red, bloody, or black. These symptoms could be signs of other problems, such as heart disease, gastric bleeding, or esophageal bleeding. MAKE SURE YOU:   Understand these instructions.  Will watch your condition.  Will get help right away if you are not doing  well or get worse. Document Released: 07/15/2005 Document Revised: 12/28/2011 Document Reviewed: 04/24/2011 [facility] Patient Information 2014 [place], [place].

## 2013-06-20 ENCOUNTER — Other Ambulatory Visit: Payer: Self-pay | Admitting: Family Medicine

## 2013-06-20 ENCOUNTER — Ambulatory Visit (INDEPENDENT_AMBULATORY_CARE_PROVIDER_SITE_OTHER): Payer: Managed Care, Other (non HMO) | Admitting: *Deleted

## 2013-06-20 DIAGNOSIS — E876 Hypokalemia: Secondary | ICD-10-CM

## 2013-06-20 DIAGNOSIS — Z0279 Encounter for issue of other medical certificate: Secondary | ICD-10-CM

## 2013-06-20 MED ORDER — POTASSIUM CHLORIDE 20 MEQ/15ML (10%) PO SOLN: 20.0000 meq | Freq: Every day | ORAL | Status: DC

## 2013-06-20 MED ORDER — POTASSIUM CHLORIDE CRYS ER 20 MEQ PO TBCR: 20.0000 meq | EXTENDED_RELEASE_TABLET | Freq: Two times a day (BID) | ORAL | Status: DC

## 2013-06-22 ENCOUNTER — Ambulatory Visit (INDEPENDENT_AMBULATORY_CARE_PROVIDER_SITE_OTHER): Payer: Managed Care, Other (non HMO) | Admitting: Family Medicine

## 2013-06-22 ENCOUNTER — Encounter: Payer: Self-pay | Admitting: Family Medicine

## 2013-06-22 VITALS — BP 124/76 | HR 88 | Temp 98.2°F | Ht <= 58 in | Wt 190.8 lb

## 2013-06-22 DIAGNOSIS — D649 Anemia, unspecified: Secondary | ICD-10-CM

## 2013-06-22 DIAGNOSIS — R1013 Epigastric pain: Secondary | ICD-10-CM

## 2013-06-22 DIAGNOSIS — Z0000 Encounter for general adult medical examination without abnormal findings: Secondary | ICD-10-CM

## 2013-06-22 DIAGNOSIS — D72819 Decreased white blood cell count, unspecified: Secondary | ICD-10-CM

## 2013-06-22 DIAGNOSIS — I10 Essential (primary) hypertension: Secondary | ICD-10-CM
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
## 2013-06-22 NOTE — Patient Instructions (Addendum)
Bring me copy of latest immunization records.  I will hold onto school forms until we get records to update chart. If not given, we will need to show evidence of immunity against Hep B, MMR, and varicella (blood tests). Good to see you today, call us with questions. Return as needed or in 1 year for next physical.

## 2013-06-23 ENCOUNTER — Telehealth: Payer: Self-pay | Admitting: *Deleted

## 2013-06-23 DIAGNOSIS — Z0000 Encounter for general adult medical examination without abnormal findings: Secondary | ICD-10-CM | POA: Insufficient documentation

## 2013-06-23 DIAGNOSIS — D72819 Decreased white blood cell count, unspecified: Secondary | ICD-10-CM | POA: Insufficient documentation

## 2013-06-23 NOTE — Assessment & Plan Note (Signed)
Preventative protocols reviewed and updated unless pt declined. Discussed healthy diet and lifestyle. Blood work reviewed with patient. Pt will check at home on immunization status and update [place] on necessary shots - may need to return for titer check. We will hold onto forms until then.

## 2013-06-23 NOTE — Telephone Encounter (Signed)
Tried to call, no answer.  Did not leave message.

## 2013-06-23 NOTE — Telephone Encounter (Signed)
Patient called and said she took K+ 30 minutes prior to meal like you suggested last night and stayed up about 1.5 hours after that and laid down. She said she threw everything up after that. Today she has had loose stools-not diarrhea. She has only been eating crackers and water today. She doesn't think the liquid K+ is going to work either. Any other suggestions?

## 2013-06-23 NOTE — Assessment & Plan Note (Signed)
Chronic, stable 

## 2013-06-23 NOTE — Assessment & Plan Note (Signed)
Longstanding - continue to monitor.

## 2013-06-23 NOTE — Assessment & Plan Note (Signed)
Chronic, stable. continue meds. 

## 2013-06-23 NOTE — Assessment & Plan Note (Signed)
Stable - continue to monitor on omeprazole 40mg  daily.

## 2013-06-26 NOTE — Telephone Encounter (Signed)
Discussed - I had asked her to take omeprazole 71min prior to meals, not liquid K.  Pt will try this, and take K with meals.  If not better,discussed change in antihypertensive (would stop HCTZ and increase cozaar). Pt agrees with plan, will call us with update on this change.

## 2013-07-04 ENCOUNTER — Ambulatory Visit (INDEPENDENT_AMBULATORY_CARE_PROVIDER_SITE_OTHER): Payer: Managed Care, Other (non HMO) | Admitting: *Deleted

## 2013-07-04 DIAGNOSIS — Z0279 Encounter for issue of other medical certificate: Secondary | ICD-10-CM

## 2013-07-06 LAB — TB SKIN TEST: Induration: 0 mm

## 2013-07-12 ENCOUNTER — Other Ambulatory Visit (HOSPITAL_BASED_OUTPATIENT_CLINIC_OR_DEPARTMENT_OTHER): Payer: Managed Care, Other (non HMO) | Admitting: Lab

## 2013-07-12 DIAGNOSIS — C50919 Malignant neoplasm of unspecified site of unspecified female breast: Secondary | ICD-10-CM

## 2013-07-12 DIAGNOSIS — C50912 Malignant neoplasm of unspecified site of left female breast: Secondary | ICD-10-CM

## 2013-07-12 LAB — CBC WITH DIFFERENTIAL/PLATELET
BASO%: 0.8 % (ref 0.0–2.0)
Basophils Absolute: 0 10*3/uL (ref 0.0–0.1)
EOS%: 2.7 % (ref 0.0–7.0)
Eosinophils Absolute: 0.2 10*3/uL (ref 0.0–0.5)
HCT: 27.9 % — ABNORMAL LOW (ref 34.8–46.6)
HGB: 9.7 g/dL — ABNORMAL LOW (ref 11.6–15.9)
LYMPH%: 30.7 % (ref 14.0–49.7)
MCH: 28.2 pg (ref 25.1–34.0)
MCHC: 34.9 g/dL (ref 31.5–36.0)
MCV: 80.8 fL (ref 79.5–101.0)
MONO#: 0.5 10*3/uL (ref 0.1–0.9)
MONO%: 9.6 % (ref 0.0–14.0)
NEUT#: 3.1 10*3/uL (ref 1.5–6.5)
NEUT%: 56.2 % (ref 38.4–76.8)
Platelets: 264 10*3/uL (ref 145–400)
RBC: 3.45 10*6/uL — ABNORMAL LOW (ref 3.70–5.45)
RDW: 13.6 % (ref 11.2–14.5)
WBC: 5.6 10*3/uL (ref 3.9–10.3)
lymph#: 1.7 10*3/uL (ref 0.9–3.3)

## 2013-07-12 LAB — COMPREHENSIVE METABOLIC PANEL (CC13)
ALT: 39 U/L (ref 0–55)
AST: 37 U/L — ABNORMAL HIGH (ref 5–34)
Albumin: 3 g/dL — ABNORMAL LOW (ref 3.5–5.0)
Alkaline Phosphatase: 123 U/L (ref 40–150)
BUN: 22.4 mg/dL (ref 7.0–26.0)
CO2: 23 meq/L (ref 22–29)
Calcium: 9.4 mg/dL (ref 8.4–10.4)
Chloride: 105 meq/L (ref 98–109)
Creatinine: 1.2 mg/dL — ABNORMAL HIGH (ref 0.6–1.1)
Glucose: 125 mg/dL (ref 70–140)
Potassium: 3.8 meq/L (ref 3.5–5.1)
Sodium: 139 meq/L (ref 136–145)
Total Bilirubin: 0.31 mg/dL (ref 0.20–1.20)
Total Protein: 8.1 g/dL (ref 6.4–8.3)

## 2013-07-18 ENCOUNTER — Ambulatory Visit (HOSPITAL_BASED_OUTPATIENT_CLINIC_OR_DEPARTMENT_OTHER): Payer: Managed Care, Other (non HMO) | Admitting: Oncology

## 2013-07-18 ENCOUNTER — Telehealth: Payer: Self-pay | Admitting: *Deleted

## 2013-07-18 VITALS — BP 118/79 | HR 101 | Temp 98.6°F | Resp 20 | Ht <= 58 in | Wt 191.5 lb

## 2013-07-18 DIAGNOSIS — C50912 Malignant neoplasm of unspecified site of left female breast: Secondary | ICD-10-CM

## 2013-07-18 DIAGNOSIS — C50512 Malignant neoplasm of lower-outer quadrant of left female breast: Secondary | ICD-10-CM

## 2013-07-18 DIAGNOSIS — C50919 Malignant neoplasm of unspecified site of unspecified female breast: Secondary | ICD-10-CM

## 2013-07-18 DIAGNOSIS — D649 Anemia, unspecified: Secondary | ICD-10-CM

## 2013-07-18 DIAGNOSIS — Z170 Estrogen receptor positive status [ER+]: Secondary | ICD-10-CM

## 2013-07-18 DIAGNOSIS — C773 Secondary and unspecified malignant neoplasm of axilla and upper limb lymph nodes: Secondary | ICD-10-CM

## 2013-07-18 MED ORDER — LETROZOLE 2.5 MG PO TABS: 2.5000 mg | ORAL_TABLET | Freq: Every day | ORAL | Status: DC

## 2013-07-18 NOTE — Telephone Encounter (Signed)
appts made and printed...td 
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
## 2013-08-08 ENCOUNTER — Other Ambulatory Visit: Payer: Self-pay | Admitting: Oncology

## 2013-10-09 ENCOUNTER — Other Ambulatory Visit: Payer: Self-pay | Admitting: Oncology

## 2013-11-06 ENCOUNTER — Other Ambulatory Visit: Payer: Self-pay | Admitting: Family Medicine

## 2013-12-06 ENCOUNTER — Other Ambulatory Visit: Payer: Self-pay | Admitting: Oncology

## 2013-12-12 ENCOUNTER — Ambulatory Visit: Payer: Managed Care, Other (non HMO) | Admitting: Family Medicine

## 2013-12-14 ENCOUNTER — Ambulatory Visit: Payer: Managed Care, Other (non HMO) | Admitting: Family Medicine

## 2013-12-21 ENCOUNTER — Encounter: Payer: Self-pay | Admitting: Family Medicine

## 2013-12-21 ENCOUNTER — Ambulatory Visit (INDEPENDENT_AMBULATORY_CARE_PROVIDER_SITE_OTHER): Payer: Managed Care, Other (non HMO) | Admitting: Family Medicine

## 2013-12-21 VITALS — BP 98/62 | HR 111 | Temp 99.2°F | Wt 186.0 lb

## 2013-12-21 DIAGNOSIS — C50519 Malignant neoplasm of lower-outer quadrant of unspecified female breast: Secondary | ICD-10-CM

## 2013-12-21 DIAGNOSIS — R21 Rash and other nonspecific skin eruption: Secondary | ICD-10-CM

## 2013-12-21 DIAGNOSIS — C50512 Malignant neoplasm of lower-outer quadrant of left female breast: Secondary | ICD-10-CM

## 2013-12-21 DIAGNOSIS — I10 Essential (primary) hypertension: Secondary | ICD-10-CM

## 2013-12-21 MED ORDER — CLOBETASOL PROPIONATE 0.05 % EX CREA: 1.0000 "application " | TOPICAL_CREAM | Freq: Two times a day (BID) | CUTANEOUS | Status: DC

## 2013-12-21 NOTE — Assessment & Plan Note (Signed)
S/p L mastectomy with lymph node excisions. Do agree with avoiding heavy lifting to avoid worsening shoulder pain and lymphedema - will provide letter to that effect.

## 2013-12-21 NOTE — Progress Notes (Signed)
Pre visit review using our clinic review tool, if applicable. No additional management support is needed unless otherwise documented below in the visit note. 

## 2013-12-21 NOTE — Patient Instructions (Signed)
Your blood pressure is low today! Let's stop hydrochlorothiazide and monitor blood pressures at home - if creeping up past 140/90, restart med but only take 1/2 tablet. Letter for work provided today. Try steroid cream for leg rash.  If persistent let me know for skin doctor referral.
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
## 2013-12-21 NOTE — Assessment & Plan Note (Signed)
Persistent eczematoid reaction with lichenification. Will increase treatment to high potency steroid cream - temovate.  Discussed max use 2 wks at a time.  If not better, will refer to derm (r/o other cause).  Not consistent with mycosis today.

## 2013-12-21 NOTE — Assessment & Plan Note (Signed)
Several last few readings with good control, and today hypotensive, but pt denies sxs. Will stop hctz.  Monitor closely at home and will start 1/2 tab if bp trending up.  Pt agrees with plan.

## 2013-12-22 ENCOUNTER — Telehealth: Payer: Self-pay | Admitting: Family Medicine

## 2013-12-22 NOTE — Telephone Encounter (Signed)
Relevant patient education assigned to patient using Emmi. ° °

## 2013-12-29 ENCOUNTER — Ambulatory Visit (INDEPENDENT_AMBULATORY_CARE_PROVIDER_SITE_OTHER): Payer: Managed Care, Other (non HMO) | Admitting: Family Medicine

## 2013-12-29 ENCOUNTER — Encounter: Payer: Self-pay | Admitting: Family Medicine

## 2013-12-29 VITALS — BP 136/90 | HR 88 | Temp 98.3°F | Wt 186.1 lb

## 2013-12-29 DIAGNOSIS — C50519 Malignant neoplasm of lower-outer quadrant of unspecified female breast: Secondary | ICD-10-CM

## 2013-12-29 DIAGNOSIS — C50512 Malignant neoplasm of lower-outer quadrant of left female breast: Secondary | ICD-10-CM

## 2013-12-29 NOTE — Progress Notes (Signed)
Pre visit review using our clinic review tool, if applicable. No additional management support is needed unless otherwise documented below in the visit note. 

## 2013-12-29 NOTE — Assessment & Plan Note (Signed)
S/p L mastectomy with LN excision, but is at risk for lymphedema. Agree with avoiding heavy lifting - forms filled out.

## 2013-12-29 NOTE — Progress Notes (Signed)
Pt presents today for me to fill out forms for work required to given her an accomodation.  See prior note for details. Forms filled out.  Copy sent for scanning.

## 2014-01-22 ENCOUNTER — Ambulatory Visit (INDEPENDENT_AMBULATORY_CARE_PROVIDER_SITE_OTHER): Payer: Managed Care, Other (non HMO) | Admitting: Family Medicine

## 2014-01-22 ENCOUNTER — Encounter: Payer: Self-pay | Admitting: Family Medicine

## 2014-01-22 VITALS — BP 131/89 | HR 107 | Ht <= 58 in | Wt 187.0 lb

## 2014-01-22 DIAGNOSIS — I10 Essential (primary) hypertension: Secondary | ICD-10-CM

## 2014-01-22 DIAGNOSIS — Z1151 Encounter for screening for human papillomavirus (HPV): Secondary | ICD-10-CM

## 2014-01-22 DIAGNOSIS — C50512 Malignant neoplasm of lower-outer quadrant of left female breast: Secondary | ICD-10-CM

## 2014-01-22 DIAGNOSIS — C50519 Malignant neoplasm of lower-outer quadrant of unspecified female breast: Secondary | ICD-10-CM

## 2014-01-22 DIAGNOSIS — Z01419 Encounter for gynecological examination (general) (routine) without abnormal findings: Secondary | ICD-10-CM

## 2014-01-22 DIAGNOSIS — Z124 Encounter for screening for malignant neoplasm of cervix: Secondary | ICD-10-CM

## 2014-01-22 NOTE — Patient Instructions (Signed)

## 2014-01-22 NOTE — Progress Notes (Signed)
  Subjective:     Melanie Holloway is a 48 y.o. female and is here for a comprehensive physical exam. The patient reports no problems. She has h/o Breast Ca--6 years post diagnosis and treatment.  Became menopausal on chemo during that time.  No menstrual cycle since then.  History   Social History  . Marital Status: Married    Spouse Name: N/A    Number of Children: N/A  . Years of Education: N/A   Occupational History  . Not on file.   Social History Main Topics  . Smoking status: Never Smoker   . Smokeless tobacco: Never Used  . Alcohol Use: No  . Drug Use: No  . Sexual Activity: Not on file   Other Topics Concern  . Not on file   Social History Narrative   Caffeine: occasional coffee and soda   Lives with husband and 17 yo child   Occupation: business Surveyor, minerals   Activity: walks regularly about 20-30 min/day   Diet: good water, fruits/vegetables daily, red meat 3-4x/wk, fish occasional   Health Maintenance  Topic Date Due  . Tetanus/tdap  10/31/1983  . Influenza Vaccine  05/19/2014  . Pap Smear  04/20/2015    The following portions of the patient's history were reviewed and updated as appropriate: allergies, current medications, past family history, past medical history, past social history, past surgical history and problem list.  Review of Systems Pertinent items are noted in HPI.   Objective:    BP 131/89  Pulse 107  Ht 4\' 10"  (1.473 m)  Wt 187 lb (84.823 kg)  BMI 39.09 kg/m2 General appearance: alert, cooperative and appears stated age Head: Normocephalic, without obvious abnormality, atraumatic Neck: supple, symmetrical, trachea midline and thyroid not enlarged, symmetric, no tenderness/mass/nodules Lungs: clear to auscultation bilaterally Breasts: negative findings: skin normal, palpation negative for masses or nodules, no palpable axillary lymphadenopathy, on right, positive findings: absent breast on left, no masses in incisions.  Post radiation  changes noted in axilla and on chest wall. Heart: regular rate and rhythm, S1, S2 normal, no murmur, click, rub or gallop Abdomen: soft, non-tender; bowel sounds normal; no masses,  no organomegaly Pelvic: cervix normal in appearance, external genitalia normal, no adnexal masses or tenderness, no cervical motion tenderness, uterus normal size, shape, and consistency and vaginal atrophy Extremities: extremities normal, atraumatic, no cyanosis or edema Pulses: 2+ and symmetric Skin: Skin color, texture, turgor normal. No rashes or lesions Lymph nodes: Cervical, supraclavicular, and axillary nodes normal. Neurologic: Grossly normal    Assessment:    Healthy female exam. Doing well.      Plan:    Pap smear today--should not need but q 3-5 years. Labs and management of other health issues with PCP Mammogram WNL 04/2013--for repeat 04/2014. See After Visit Summary for Counseling Recommendations

## 2014-01-27 ENCOUNTER — Other Ambulatory Visit: Payer: Self-pay | Admitting: Physician Assistant

## 2014-01-29 ENCOUNTER — Other Ambulatory Visit: Payer: Self-pay | Admitting: Family Medicine

## 2014-01-29 NOTE — Telephone Encounter (Signed)
Ok to refill? Not on current med list and it doesn't appear that you have filled this previously.

## 2014-02-07 ENCOUNTER — Other Ambulatory Visit: Payer: Self-pay | Admitting: Physician Assistant

## 2014-04-09 ENCOUNTER — Other Ambulatory Visit: Payer: Self-pay

## 2014-04-09 DIAGNOSIS — Z9012 Acquired absence of left breast and nipple: Secondary | ICD-10-CM

## 2014-04-09 DIAGNOSIS — Z1231 Encounter for screening mammogram for malignant neoplasm of breast: Secondary | ICD-10-CM

## 2014-04-27 ENCOUNTER — Telehealth: Payer: Self-pay

## 2014-04-27 MED ORDER — HYDROCHLOROTHIAZIDE 25 MG PO TABS: ORAL_TABLET | ORAL | Status: DC

## 2014-04-27 NOTE — Telephone Encounter (Signed)
Patient notified

## 2014-04-27 NOTE — Telephone Encounter (Signed)
Sent hctz 25mg  daily to [facility]. Ok to continue this med and stop losartan.

## 2014-04-27 NOTE — Telephone Encounter (Signed)
Pt was seen 12/21/13; pt stopped losartan instead of HCTZ in 12/2013. Pts BP averages 120/86 -130/70; BP has not been > 140/90. Office note advised to stop HCTZ but pt misunderstood and stopped Losartan.Pt has continued to take HCTZ. Pt wants to know does Dr Darnell Level want pt to take Losartan or HCTZ. Pt request 90 day rx for whichever med Dr Darnell Level wants pt to take sent to [place] today due to possible ins change.Pt request cb.

## 2014-07-02 ENCOUNTER — Ambulatory Visit
Admission: RE | Admit: 2014-07-02 | Discharge: 2014-07-02 | Disposition: A | Discharge status: Home / Self Care | Payer: BC Managed Care – PPO | Source: Ambulatory Visit

## 2014-07-02 DIAGNOSIS — Z1231 Encounter for screening mammogram for malignant neoplasm of breast: Secondary | ICD-10-CM

## 2014-07-02 DIAGNOSIS — Z9012 Acquired absence of left breast and nipple: Secondary | ICD-10-CM

## 2014-07-06 ENCOUNTER — Encounter: Payer: Self-pay | Admitting: Family Medicine

## 2014-07-12 ENCOUNTER — Other Ambulatory Visit (HOSPITAL_BASED_OUTPATIENT_CLINIC_OR_DEPARTMENT_OTHER): Payer: BC Managed Care – PPO

## 2014-07-12 DIAGNOSIS — C50919 Malignant neoplasm of unspecified site of unspecified female breast: Secondary | ICD-10-CM

## 2014-07-12 LAB — COMPREHENSIVE METABOLIC PANEL (CC13)
ALBUMIN: 3.3 g/dL — AB (ref 3.5–5.0)
ALK PHOS: 113 U/L (ref 40–150)
ALT: 20 U/L (ref 0–55)
AST: 26 U/L (ref 5–34)
Anion Gap: 9 mEq/L (ref 3–11)
BILIRUBIN TOTAL: 0.42 mg/dL (ref 0.20–1.20)
BUN: 22.8 mg/dL (ref 7.0–26.0)
CO2: 26 mEq/L (ref 22–29)
CREATININE: 1.1 mg/dL (ref 0.6–1.1)
Calcium: 9.7 mg/dL (ref 8.4–10.4)
Chloride: 104 mEq/L (ref 98–109)
GLUCOSE: 105 mg/dL (ref 70–140)
Potassium: 3.4 mEq/L — ABNORMAL LOW (ref 3.5–5.1)
Sodium: 139 mEq/L (ref 136–145)
Total Protein: 8.2 g/dL (ref 6.4–8.3)

## 2014-07-12 LAB — CBC WITH DIFFERENTIAL/PLATELET
BASO%: 1.1 % (ref 0.0–2.0)
BASOS ABS: 0 10*3/uL (ref 0.0–0.1)
EOS ABS: 0.1 10*3/uL (ref 0.0–0.5)
EOS%: 2.7 % (ref 0.0–7.0)
HCT: 30.2 % — ABNORMAL LOW (ref 34.8–46.6)
HEMOGLOBIN: 10.3 g/dL — AB (ref 11.6–15.9)
LYMPH%: 32.7 % (ref 14.0–49.7)
MCH: 27.6 pg (ref 25.1–34.0)
MCHC: 34.1 g/dL (ref 31.5–36.0)
MCV: 80.8 fL (ref 79.5–101.0)
MONO#: 0.4 10*3/uL (ref 0.1–0.9)
MONO%: 10.4 % (ref 0.0–14.0)
NEUT%: 53.1 % (ref 38.4–76.8)
NEUTROS ABS: 2 10*3/uL (ref 1.5–6.5)
PLATELETS: 241 10*3/uL (ref 145–400)
RBC: 3.74 10*6/uL (ref 3.70–5.45)
RDW: 13.5 % (ref 11.2–14.5)
WBC: 3.8 10*3/uL — ABNORMAL LOW (ref 3.9–10.3)
lymph#: 1.2 10*3/uL (ref 0.9–3.3)

## 2014-07-19 ENCOUNTER — Ambulatory Visit (HOSPITAL_BASED_OUTPATIENT_CLINIC_OR_DEPARTMENT_OTHER): Payer: BC Managed Care – PPO | Admitting: Nurse Practitioner

## 2014-07-19 ENCOUNTER — Encounter: Payer: Self-pay | Admitting: Nurse Practitioner

## 2014-07-19 ENCOUNTER — Telehealth: Payer: Self-pay | Admitting: Nurse Practitioner

## 2014-07-19 VITALS — BP 137/86 | HR 84 | Temp 98.7°F | Resp 18 | Ht <= 58 in | Wt 180.4 lb

## 2014-07-19 DIAGNOSIS — E876 Hypokalemia: Secondary | ICD-10-CM | POA: Insufficient documentation

## 2014-07-19 DIAGNOSIS — D649 Anemia, unspecified: Secondary | ICD-10-CM

## 2014-07-19 DIAGNOSIS — C50512 Malignant neoplasm of lower-outer quadrant of left female breast: Secondary | ICD-10-CM

## 2014-07-19 DIAGNOSIS — Z853 Personal history of malignant neoplasm of breast: Secondary | ICD-10-CM

## 2014-07-19 DIAGNOSIS — Z79810 Long term (current) use of selective estrogen receptor modulators (SERMs): Secondary | ICD-10-CM

## 2014-07-19 NOTE — Telephone Encounter (Signed)
per pof to sch pt appt-gave pt copy of sch-pt req to come for labs a week b4-sch-adv HF
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
## 2014-08-20 ENCOUNTER — Encounter: Payer: Self-pay | Admitting: Nurse Practitioner

## 2014-10-01 ENCOUNTER — Telehealth: Payer: Self-pay | Admitting: Oncology

## 2014-10-01 NOTE — Telephone Encounter (Signed)
lvm for pt regarding to OCT 3 appt moved to 10. 13 per md on pal.....mailed pt appt sched and letter

## 2015-01-18 ENCOUNTER — Other Ambulatory Visit: Payer: Self-pay | Admitting: *Deleted

## 2015-01-18 DIAGNOSIS — C50512 Malignant neoplasm of lower-outer quadrant of left female breast: Secondary | ICD-10-CM

## 2015-01-18 MED ORDER — LETROZOLE 2.5 MG PO TABS: 2.5000 mg | ORAL_TABLET | Freq: Every day | ORAL | Status: DC

## 2015-02-05 ENCOUNTER — Ambulatory Visit (INDEPENDENT_AMBULATORY_CARE_PROVIDER_SITE_OTHER): Payer: BLUE CROSS/BLUE SHIELD | Admitting: Family Medicine

## 2015-02-05 ENCOUNTER — Encounter: Payer: Self-pay | Admitting: Family Medicine

## 2015-02-05 VITALS — BP 134/90 | HR 84 | Ht 59.0 in | Wt 178.0 lb

## 2015-02-05 DIAGNOSIS — Z124 Encounter for screening for malignant neoplasm of cervix: Secondary | ICD-10-CM

## 2015-02-05 DIAGNOSIS — Z1151 Encounter for screening for human papillomavirus (HPV): Secondary | ICD-10-CM

## 2015-02-05 DIAGNOSIS — Z01419 Encounter for gynecological examination (general) (routine) without abnormal findings: Secondary | ICD-10-CM | POA: Diagnosis not present

## 2015-02-05 NOTE — Progress Notes (Signed)
  Subjective:     Melanie Holloway is a 49 y.o. female and is here for a comprehensive physical exam. The patient reports no problems. She is now 7 year survivor of Stage IIIA breast cancer. Menopausal since treatment. Reports dry, itchy rash on her skin.  Using Dial soap. Desires continued annual pap smears.  History   Social History  . Marital Status: Married    Spouse Name: N/A  . Number of Children: N/A  . Years of Education: N/A   Occupational History  . Not on file.   Social History Main Topics  . Smoking status: Never Smoker   . Smokeless tobacco: Never Used  . Alcohol Use: No  . Drug Use: No  . Sexual Activity: Not on file   Other Topics Concern  . Not on file   Social History Narrative   Caffeine: occasional coffee and soda   Lives with husband and 29 yo child   Occupation: business Surveyor, minerals   Activity: walks regularly about 20-30 min/day   Diet: good water, fruits/vegetables daily, red meat 3-4x/wk, fish occasional   Health Maintenance  Topic Date Due  . TETANUS/TDAP  10/31/1983  . COLONOSCOPY  10/30/2014  . INFLUENZA VACCINE  05/20/2015  . MAMMOGRAM  07/02/2016  . PAP SMEAR  01/22/2017  . HIV Screening  Completed    The following portions of the patient's history were reviewed and updated as appropriate: allergies, current medications, past family history, past medical history, past social history, past surgical history and problem list.  Review of Systems Pertinent items are noted in HPI.   Objective:    BP 134/90 mmHg  Pulse 84  Ht 4\' 11"  (1.499 m)  Wt 178 lb (80.74 kg)  BMI 35.93 kg/m2 General appearance: alert, cooperative, appears stated age and mildly obese Head: Normocephalic, without obvious abnormality, atraumatic Neck: no adenopathy, supple, symmetrical, trachea midline and thyroid not enlarged, symmetric, no tenderness/mass/nodules Lungs: clear to auscultation bilaterally Breasts: left breast surgically absent, chest wall with post  radiation changes, no masses, right breast with no mass, everted nipple. Heart: regular rate and rhythm, S1, S2 normal, no murmur, click, rub or gallop Abdomen: soft, non-tender; bowel sounds normal; no masses,  no organomegaly Pelvic: cervix normal in appearance, external genitalia normal, no adnexal masses or tenderness, no cervical motion tenderness, uterus normal size, shape, and consistency and atrophic vagina Extremities: extremities normal, atraumatic, no cyanosis or edema Pulses: 2+ and symmetric Skin: Skin color, texture, turgor normal. No rashes or lesions Lymph nodes: Cervical, supraclavicular, and axillary nodes normal. Neurologic: Grossly normal    Assessment:    Healthy female exam.      Plan:  Mammogram with oncologist Labs with primary MD - has yearly scheduled with him in July. Problem List Items Addressed This Visit    None    Visit Diagnoses    Screening for malignant neoplasm of cervix    -  Primary    Encounter for routine gynecological examination        Relevant Orders    Cytology - PAP         See After Visit Summary for Counseling Recommendations

## 2015-02-05 NOTE — Patient Instructions (Signed)
Preventive Care for Adults A healthy lifestyle and preventive care can promote health and wellness. Preventive health guidelines for women include the following key practices.  A routine yearly physical is a good way to check with your health care provider about your health and preventive screening. It is a chance to share any concerns and updates on your health and to receive a thorough exam.  Visit your dentist for a routine exam and preventive care every 6 months. Brush your teeth twice a day and floss once a day. Good oral hygiene prevents tooth decay and gum disease.  The frequency of eye exams is based on your age, health, family medical history, use of contact lenses, and other factors. Follow your health care provider's recommendations for frequency of eye exams.  Eat a healthy diet. Foods like vegetables, fruits, whole grains, low-fat dairy products, and lean protein foods contain the nutrients you need without too many calories. Decrease your intake of foods high in solid fats, added sugars, and salt. Eat the right amount of calories for you.Get information about a proper diet from your health care provider, if necessary.  Regular physical exercise is one of the most important things you can do for your health. Most adults should get at least 150 minutes of moderate-intensity exercise (any activity that increases your heart rate and causes you to sweat) each week. In addition, most adults need muscle-strengthening exercises on 2 or more days a week.  Maintain a healthy weight. The body mass index (BMI) is a screening tool to identify possible weight problems. It provides an estimate of body fat based on height and weight. Your health care provider can find your BMI and can help you achieve or maintain a healthy weight.For adults 20 years and older:  A BMI below 18.5 is considered underweight.  A BMI of 18.5 to 24.9 is normal.  A BMI of 25 to 29.9 is considered overweight.  A BMI of  30 and above is considered obese.  Maintain normal blood lipids and cholesterol levels by exercising and minimizing your intake of saturated fat. Eat a balanced diet with plenty of fruit and vegetables. Blood tests for lipids and cholesterol should begin at age 76 and be repeated every 5 years. If your lipid or cholesterol levels are high, you are over 50, or you are at high risk for heart disease, you may need your cholesterol levels checked more frequently.Ongoing high lipid and cholesterol levels should be treated with medicines if diet and exercise are not working.  If you smoke, find out from your health care provider how to quit. If you do not use tobacco, do not start.  Lung cancer screening is recommended for adults aged 22-80 years who are at high risk for developing lung cancer because of a history of smoking. A yearly low-dose CT scan of the lungs is recommended for people who have at least a 30-pack-year history of smoking and are a current smoker or have quit within the past 15 years. A pack year of smoking is smoking an average of 1 pack of cigarettes a day for 1 year (for example: 1 pack a day for 30 years or 2 packs a day for 15 years). Yearly screening should continue until the smoker has stopped smoking for at least 15 years. Yearly screening should be stopped for people who develop a health problem that would prevent them from having lung cancer treatment.  If you are pregnant, do not drink alcohol. If you are breastfeeding,  be very cautious about drinking alcohol. If you are not pregnant and choose to drink alcohol, do not have more than 1 drink per day. One drink is considered to be 12 ounces (355 mL) of beer, 5 ounces (148 mL) of wine, or 1.5 ounces (44 mL) of liquor.  Avoid use of street drugs. Do not share needles with anyone. Ask for help if you need support or instructions about stopping the use of drugs.  High blood pressure causes heart disease and increases the risk of  stroke. Your blood pressure should be checked at least every 1 to 2 years. Ongoing high blood pressure should be treated with medicines if weight loss and exercise do not work.  If you are 75-52 years old, ask your health care provider if you should take aspirin to prevent strokes.  Diabetes screening involves taking a blood sample to check your fasting blood sugar level. This should be done once every 3 years, after age 15, if you are within normal weight and without risk factors for diabetes. Testing should be considered at a younger age or be carried out more frequently if you are overweight and have at least 1 risk factor for diabetes.  Breast cancer screening is essential preventive care for women. You should practice "breast self-awareness." This means understanding the normal appearance and feel of your breasts and may include breast self-examination. Any changes detected, no matter how small, should be reported to a health care provider. Women in their 58s and 30s should have a clinical breast exam (CBE) by a health care provider as part of a regular health exam every 1 to 3 years. After age 16, women should have a CBE every year. Starting at age 53, women should consider having a mammogram (breast X-ray test) every year. Women who have a family history of breast cancer should talk to their health care provider about genetic screening. Women at a high risk of breast cancer should talk to their health care providers about having an MRI and a mammogram every year.  Breast cancer gene (BRCA)-related cancer risk assessment is recommended for women who have family members with BRCA-related cancers. BRCA-related cancers include breast, ovarian, tubal, and peritoneal cancers. Having family members with these cancers may be associated with an increased risk for harmful changes (mutations) in the breast cancer genes BRCA1 and BRCA2. Results of the assessment will determine the need for genetic counseling and  BRCA1 and BRCA2 testing.  Routine pelvic exams to screen for cancer are no longer recommended for nonpregnant women who are considered low risk for cancer of the pelvic organs (ovaries, uterus, and vagina) and who do not have symptoms. Ask your health care provider if a screening pelvic exam is right for you.  If you have had past treatment for cervical cancer or a condition that could lead to cancer, you need Pap tests and screening for cancer for at least 20 years after your treatment. If Pap tests have been discontinued, your risk factors (such as having a new sexual partner) need to be reassessed to determine if screening should be resumed. Some women have medical problems that increase the chance of getting cervical cancer. In these cases, your health care provider may recommend more frequent screening and Pap tests.  The HPV test is an additional test that may be used for cervical cancer screening. The HPV test looks for the virus that can cause the cell changes on the cervix. The cells collected during the Pap test can be  tested for HPV. The HPV test could be used to screen women aged 30 years and older, and should be used in women of any age who have unclear Pap test results. After the age of 30, women should have HPV testing at the same frequency as a Pap test.  Colorectal cancer can be detected and often prevented. Most routine colorectal cancer screening begins at the age of 50 years and continues through age 75 years. However, your health care provider may recommend screening at an earlier age if you have risk factors for colon cancer. On a yearly basis, your health care provider may provide home test kits to check for hidden blood in the stool. Use of a small camera at the end of a tube, to directly examine the colon (sigmoidoscopy or colonoscopy), can detect the earliest forms of colorectal cancer. Talk to your health care provider about this at age 50, when routine screening begins. Direct  exam of the colon should be repeated every 5-10 years through age 75 years, unless early forms of pre-cancerous polyps or small growths are found.  People who are at an increased risk for hepatitis B should be screened for this virus. You are considered at high risk for hepatitis B if:  You were born in a country where hepatitis B occurs often. Talk with your health care provider about which countries are considered high risk.  Your parents were born in a high-risk country and you have not received a shot to protect against hepatitis B (hepatitis B vaccine).  You have HIV or AIDS.  You use needles to inject street drugs.  You live with, or have sex with, someone who has hepatitis B.  You get hemodialysis treatment.  You take certain medicines for conditions like cancer, organ transplantation, and autoimmune conditions.  Hepatitis C blood testing is recommended for all people born from 1945 through 1965 and any individual with known risks for hepatitis C.  Practice safe sex. Use condoms and avoid high-risk sexual practices to reduce the spread of sexually transmitted infections (STIs). STIs include gonorrhea, chlamydia, syphilis, trichomonas, herpes, HPV, and human immunodeficiency virus (HIV). Herpes, HIV, and HPV are viral illnesses that have no cure. They can result in disability, cancer, and death.  You should be screened for sexually transmitted illnesses (STIs) including gonorrhea and chlamydia if:  You are sexually active and are younger than 24 years.  You are older than 24 years and your health care provider tells you that you are at risk for this type of infection.  Your sexual activity has changed since you were last screened and you are at an increased risk for chlamydia or gonorrhea. Ask your health care provider if you are at risk.  If you are at risk of being infected with HIV, it is recommended that you take a prescription medicine daily to prevent HIV infection. This is  called preexposure prophylaxis (PrEP). You are considered at risk if:  You are a heterosexual woman, are sexually active, and are at increased risk for HIV infection.  You take drugs by injection.  You are sexually active with a partner who has HIV.  Talk with your health care provider about whether you are at high risk of being infected with HIV. If you choose to begin PrEP, you should first be tested for HIV. You should then be tested every 3 months for as long as you are taking PrEP.  Osteoporosis is a disease in which the bones lose minerals and strength   with aging. This can result in serious bone fractures or breaks. The risk of osteoporosis can be identified using a bone density scan. Women ages 65 years and over and women at risk for fractures or osteoporosis should discuss screening with their health care providers. Ask your health care provider whether you should take a calcium supplement or vitamin D to reduce the rate of osteoporosis.  Menopause can be associated with physical symptoms and risks. Hormone replacement therapy is available to decrease symptoms and risks. You should talk to your health care provider about whether hormone replacement therapy is right for you.  Use sunscreen. Apply sunscreen liberally and repeatedly throughout the day. You should seek shade when your shadow is shorter than you. Protect yourself by wearing long sleeves, pants, a wide-brimmed hat, and sunglasses year round, whenever you are outdoors.  Once a month, do a whole body skin exam, using a mirror to look at the skin on your back. Tell your health care provider of new moles, moles that have irregular borders, moles that are larger than a pencil eraser, or moles that have changed in shape or color.  Stay current with required vaccines (immunizations).  Influenza vaccine. All adults should be immunized every year.  Tetanus, diphtheria, and acellular pertussis (Td, Tdap) vaccine. Pregnant women should  receive 1 dose of Tdap vaccine during each pregnancy. The dose should be obtained regardless of the length of time since the last dose. Immunization is preferred during the 27th-36th week of gestation. An adult who has not previously received Tdap or who does not know her vaccine status should receive 1 dose of Tdap. This initial dose should be followed by tetanus and diphtheria toxoids (Td) booster doses every 10 years. Adults with an unknown or incomplete history of completing a 3-dose immunization series with Td-containing vaccines should begin or complete a primary immunization series including a Tdap dose. Adults should receive a Td booster every 10 years.  Varicella vaccine. An adult without evidence of immunity to varicella should receive 2 doses or a second dose if she has previously received 1 dose. Pregnant females who do not have evidence of immunity should receive the first dose after pregnancy. This first dose should be obtained before leaving the health care facility. The second dose should be obtained 4-8 weeks after the first dose.  Human papillomavirus (HPV) vaccine. Females aged 13-26 years who have not received the vaccine previously should obtain the 3-dose series. The vaccine is not recommended for use in pregnant females. However, pregnancy testing is not needed before receiving a dose. If a female is found to be pregnant after receiving a dose, no treatment is needed. In that case, the remaining doses should be delayed until after the pregnancy. Immunization is recommended for any person with an immunocompromised condition through the age of 26 years if she did not get any or all doses earlier. During the 3-dose series, the second dose should be obtained 4-8 weeks after the first dose. The third dose should be obtained 24 weeks after the first dose and 16 weeks after the second dose.  Zoster vaccine. One dose is recommended for adults aged 60 years or older unless certain conditions are  present.  Measles, mumps, and rubella (MMR) vaccine. Adults born before 1957 generally are considered immune to measles and mumps. Adults born in 1957 or later should have 1 or more doses of MMR vaccine unless there is a contraindication to the vaccine or there is laboratory evidence of immunity to   each of the three diseases. A routine second dose of MMR vaccine should be obtained at least 28 days after the first dose for students attending postsecondary schools, health care workers, or international travelers. People who received inactivated measles vaccine or an unknown type of measles vaccine during 1963-1967 should receive 2 doses of MMR vaccine. People who received inactivated mumps vaccine or an unknown type of mumps vaccine before 1979 and are at high risk for mumps infection should consider immunization with 2 doses of MMR vaccine. For females of childbearing age, rubella immunity should be determined. If there is no evidence of immunity, females who are not pregnant should be vaccinated. If there is no evidence of immunity, females who are pregnant should delay immunization until after pregnancy. Unvaccinated health care workers born before 1957 who lack laboratory evidence of measles, mumps, or rubella immunity or laboratory confirmation of disease should consider measles and mumps immunization with 2 doses of MMR vaccine or rubella immunization with 1 dose of MMR vaccine.  Pneumococcal 13-valent conjugate (PCV13) vaccine. When indicated, a person who is uncertain of her immunization history and has no record of immunization should receive the PCV13 vaccine. An adult aged 19 years or older who has certain medical conditions and has not been previously immunized should receive 1 dose of PCV13 vaccine. This PCV13 should be followed with a dose of pneumococcal polysaccharide (PPSV23) vaccine. The PPSV23 vaccine dose should be obtained at least 8 weeks after the dose of PCV13 vaccine. An adult aged 19  years or older who has certain medical conditions and previously received 1 or more doses of PPSV23 vaccine should receive 1 dose of PCV13. The PCV13 vaccine dose should be obtained 1 or more years after the last PPSV23 vaccine dose.  Pneumococcal polysaccharide (PPSV23) vaccine. When PCV13 is also indicated, PCV13 should be obtained first. All adults aged 65 years and older should be immunized. An adult younger than age 65 years who has certain medical conditions should be immunized. Any person who resides in a nursing home or long-term care facility should be immunized. An adult smoker should be immunized. People with an immunocompromised condition and certain other conditions should receive both PCV13 and PPSV23 vaccines. People with human immunodeficiency virus (HIV) infection should be immunized as soon as possible after diagnosis. Immunization during chemotherapy or radiation therapy should be avoided. Routine use of PPSV23 vaccine is not recommended for American Indians, [place] Natives, or people younger than 65 years unless there are medical conditions that require PPSV23 vaccine. When indicated, people who have unknown immunization and have no record of immunization should receive PPSV23 vaccine. One-time revaccination 5 years after the first dose of PPSV23 is recommended for people aged 19-64 years who have chronic kidney failure, nephrotic syndrome, asplenia, or immunocompromised conditions. People who received 1-2 doses of PPSV23 before age 65 years should receive another dose of PPSV23 vaccine at age 65 years or later if at least 5 years have passed since the previous dose. Doses of PPSV23 are not needed for people immunized with PPSV23 at or after age 65 years.  Meningococcal vaccine. Adults with asplenia or persistent complement component deficiencies should receive 2 doses of quadrivalent meningococcal conjugate (MenACWY-D) vaccine. The doses should be obtained at least 2 months apart.  Microbiologists working with certain meningococcal bacteria, military recruits, people at risk during an outbreak, and people who travel to or live in countries with a high rate of meningitis should be immunized. A first-year college student up through age   21 years who is living in a residence hall should receive a dose if she did not receive a dose on or after her 16th birthday. Adults who have certain high-risk conditions should receive one or more doses of vaccine.  Hepatitis A vaccine. Adults who wish to be protected from this disease, have certain high-risk conditions, work with hepatitis A-infected animals, work in hepatitis A research labs, or travel to or work in countries with a high rate of hepatitis A should be immunized. Adults who were previously unvaccinated and who anticipate close contact with an international adoptee during the first 60 days after arrival in the [place] from a country with a high rate of hepatitis A should be immunized.  Hepatitis B vaccine. Adults who wish to be protected from this disease, have certain high-risk conditions, may be exposed to blood or other infectious body fluids, are household contacts or sex partners of hepatitis B positive people, are clients or workers in certain care facilities, or travel to or work in countries with a high rate of hepatitis B should be immunized.  Haemophilus influenzae type b (Hib) vaccine. A previously unvaccinated person with asplenia or sickle cell disease or having a scheduled splenectomy should receive 1 dose of Hib vaccine. Regardless of previous immunization, a recipient of a hematopoietic stem cell transplant should receive a 3-dose series 6-12 months after her successful transplant. Hib vaccine is not recommended for adults with HIV infection. Preventive Services / Frequency Ages 64 to 68 years  Blood pressure check.** / Every 1 to 2 years.  Lipid and cholesterol check.** / Every 5 years beginning at age  22.  Clinical breast exam.** / Every 3 years for women in their 88s and 53s.  BRCA-related cancer risk assessment.** / For women who have family members with a BRCA-related cancer (breast, ovarian, tubal, or peritoneal cancers).  Pap test.** / Every 2 years from ages 90 through 51. Every 3 years starting at age 21 through age 56 or 3 with a history of 3 consecutive normal Pap tests.  HPV screening.** / Every 3 years from ages 24 through ages 1 to 46 with a history of 3 consecutive normal Pap tests.  Hepatitis C blood test.** / For any individual with known risks for hepatitis C.  Skin self-exam. / Monthly.  Influenza vaccine. / Every year.  Tetanus, diphtheria, and acellular pertussis (Tdap, Td) vaccine.** / Consult your health care provider. Pregnant women should receive 1 dose of Tdap vaccine during each pregnancy. 1 dose of Td every 10 years.  Varicella vaccine.** / Consult your health care provider. Pregnant females who do not have evidence of immunity should receive the first dose after pregnancy.  HPV vaccine. / 3 doses over 6 months, if 72 and younger. The vaccine is not recommended for use in pregnant females. However, pregnancy testing is not needed before receiving a dose.  Measles, mumps, rubella (MMR) vaccine.** / You need at least 1 dose of MMR if you were born in 1957 or later. You may also need a 2nd dose. For females of childbearing age, rubella immunity should be determined. If there is no evidence of immunity, females who are not pregnant should be vaccinated. If there is no evidence of immunity, females who are pregnant should delay immunization until after pregnancy.  Pneumococcal 13-valent conjugate (PCV13) vaccine.** / Consult your health care provider.  Pneumococcal polysaccharide (PPSV23) vaccine.** / 1 to 2 doses if you smoke cigarettes or if you have certain conditions.  Meningococcal vaccine.** /  1 dose if you are age 19 to 21 years and a first-year college  student living in a residence hall, or have one of several medical conditions, you need to get vaccinated against meningococcal disease. You may also need additional booster doses.  Hepatitis A vaccine.** / Consult your health care provider.  Hepatitis B vaccine.** / Consult your health care provider.  Haemophilus influenzae type b (Hib) vaccine.** / Consult your health care provider. Ages 40 to 64 years  Blood pressure check.** / Every 1 to 2 years.  Lipid and cholesterol check.** / Every 5 years beginning at age 20 years.  Lung cancer screening. / Every year if you are aged 55-80 years and have a 30-pack-year history of smoking and currently smoke or have quit within the past 15 years. Yearly screening is stopped once you have quit smoking for at least 15 years or develop a health problem that would prevent you from having lung cancer treatment.  Clinical breast exam.** / Every year after age 40 years.  BRCA-related cancer risk assessment.** / For women who have family members with a BRCA-related cancer (breast, ovarian, tubal, or peritoneal cancers).  Mammogram.** / Every year beginning at age 40 years and continuing for as long as you are in good health. Consult with your health care provider.  Pap test.** / Every 3 years starting at age 30 years through age 65 or 70 years with a history of 3 consecutive normal Pap tests.  HPV screening.** / Every 3 years from ages 30 years through ages 65 to 70 years with a history of 3 consecutive normal Pap tests.  Fecal occult blood test (FOBT) of stool. / Every year beginning at age 50 years and continuing until age 75 years. You may not need to do this test if you get a colonoscopy every 10 years.  Flexible sigmoidoscopy or colonoscopy.** / Every 5 years for a flexible sigmoidoscopy or every 10 years for a colonoscopy beginning at age 50 years and continuing until age 75 years.  Hepatitis C blood test.** / For all people born from 1945 through  1965 and any individual with known risks for hepatitis C.  Skin self-exam. / Monthly.  Influenza vaccine. / Every year.  Tetanus, diphtheria, and acellular pertussis (Tdap/Td) vaccine.** / Consult your health care provider. Pregnant women should receive 1 dose of Tdap vaccine during each pregnancy. 1 dose of Td every 10 years.  Varicella vaccine.** / Consult your health care provider. Pregnant females who do not have evidence of immunity should receive the first dose after pregnancy.  Zoster vaccine.** / 1 dose for adults aged 60 years or older.  Measles, mumps, rubella (MMR) vaccine.** / You need at least 1 dose of MMR if you were born in 1957 or later. You may also need a 2nd dose. For females of childbearing age, rubella immunity should be determined. If there is no evidence of immunity, females who are not pregnant should be vaccinated. If there is no evidence of immunity, females who are pregnant should delay immunization until after pregnancy.  Pneumococcal 13-valent conjugate (PCV13) vaccine.** / Consult your health care provider.  Pneumococcal polysaccharide (PPSV23) vaccine.** / 1 to 2 doses if you smoke cigarettes or if you have certain conditions.  Meningococcal vaccine.** / Consult your health care provider.  Hepatitis A vaccine.** / Consult your health care provider.  Hepatitis B vaccine.** / Consult your health care provider.  Haemophilus influenzae type b (Hib) vaccine.** / Consult your health care provider. Ages 65   years and over  Blood pressure check.** / Every 1 to 2 years.  Lipid and cholesterol check.** / Every 5 years beginning at age 68 years.  Lung cancer screening. / Every year if you are aged 73-80 years and have a 30-pack-year history of smoking and currently smoke or have quit within the past 15 years. Yearly screening is stopped once you have quit smoking for at least 15 years or develop a health problem that would prevent you from having lung cancer  treatment.  Clinical breast exam.** / Every year after age 13 years.  BRCA-related cancer risk assessment.** / For women who have family members with a BRCA-related cancer (breast, ovarian, tubal, or peritoneal cancers).  Mammogram.** / Every year beginning at age 60 years and continuing for as long as you are in good health. Consult with your health care provider.  Pap test.** / Every 3 years starting at age 60 years through age 96 or 36 years with 3 consecutive normal Pap tests. Testing can be stopped between 65 and 70 years with 3 consecutive normal Pap tests and no abnormal Pap or HPV tests in the past 10 years.  HPV screening.** / Every 3 years from ages 25 years through ages 87 or 16 years with a history of 3 consecutive normal Pap tests. Testing can be stopped between 65 and 70 years with 3 consecutive normal Pap tests and no abnormal Pap or HPV tests in the past 10 years.  Fecal occult blood test (FOBT) of stool. / Every year beginning at age 64 years and continuing until age 28 years. You may not need to do this test if you get a colonoscopy every 10 years.  Flexible sigmoidoscopy or colonoscopy.** / Every 5 years for a flexible sigmoidoscopy or every 10 years for a colonoscopy beginning at age 25 years and continuing until age 71 years.  Hepatitis C blood test.** / For all people born from [birth year] through 1966 and any individual with known risks for hepatitis C.  Osteoporosis screening.** / A one-time screening for women ages 50 years and over and women at risk for fractures or osteoporosis.  Skin self-exam. / Monthly.  Influenza vaccine. / Every year.  Tetanus, diphtheria, and acellular pertussis (Tdap/Td) vaccine.** / 1 dose of Td every 10 years.  Varicella vaccine.** / Consult your health care provider.  Zoster vaccine.** / 1 dose for adults aged 10 years or older.  Pneumococcal 13-valent conjugate (PCV13) vaccine.** / Consult your health care provider.  Pneumococcal  polysaccharide (PPSV23) vaccine.** / 1 dose for all adults aged 25 years and older.  Meningococcal vaccine.** / Consult your health care provider.  Hepatitis A vaccine.** / Consult your health care provider.  Hepatitis B vaccine.** / Consult your health care provider.  Haemophilus influenzae type b (Hib) vaccine.** / Consult your health care provider. ** Family history and personal history of risk and conditions may change your health care provider's recommendations. Document Released: 12/01/2001 Document Revised: 02/19/2014 Document Reviewed: 03/02/2011 [facility] Patient Information 2015 [place], [place]. This information is not intended to replace advice given to you by your health care provider. Make sure you discuss any questions you have with your health care provider. Eczema Eczema, also called atopic dermatitis, is a skin disorder that causes inflammation of the skin. It causes a red rash and dry, scaly skin. The skin becomes very itchy. Eczema is generally worse during the cooler winter months and often improves with the warmth of summer. Eczema usually starts showing signs in infancy.  Some children outgrow eczema, but it may last through adulthood.  CAUSES  The exact cause of eczema is not known, but it appears to run in families. People with eczema often have a family history of eczema, allergies, asthma, or hay fever. Eczema is not contagious. Flare-ups of the condition may be caused by:   Contact with something you are sensitive or allergic to.   Stress. SIGNS AND SYMPTOMS  Dry, scaly skin.   Red, itchy rash.   Itchiness. This may occur before the skin rash and may be very intense.  DIAGNOSIS  The diagnosis of eczema is usually made based on symptoms and medical history. TREATMENT  Eczema cannot be cured, but symptoms usually can be controlled with treatment and other strategies. A treatment plan might include:  Controlling the itching and scratching.   Use  over-the-counter antihistamines as directed for itching. This is especially useful at night when the itching tends to be worse.   Use over-the-counter steroid creams as directed for itching.   Avoid scratching. Scratching makes the rash and itching worse. It may also result in a skin infection (impetigo) due to a break in the skin caused by scratching.   Keeping the skin well moisturized with creams every day. This will seal in moisture and help prevent dryness. Lotions that contain alcohol and water should be avoided because they can dry the skin.   Limiting exposure to things that you are sensitive or allergic to (allergens).   Recognizing situations that cause stress.   Developing a plan to manage stress.  HOME CARE INSTRUCTIONS   Only take over-the-counter or prescription medicines as directed by your health care provider.   Do not use anything on the skin without checking with your health care provider.   Keep baths or showers short (5 minutes) in warm (not hot) water. Use mild cleansers for bathing. These should be unscented. You may add nonperfumed bath oil to the bath water. It is best to avoid soap and bubble bath.   Immediately after a bath or shower, when the skin is still damp, apply a moisturizing ointment to the entire body. This ointment should be a petroleum ointment. This will seal in moisture and help prevent dryness. The thicker the ointment, the better. These should be unscented.   Keep fingernails cut short. Children with eczema may need to wear soft gloves or mittens at night after applying an ointment.   Dress in clothes made of cotton or cotton blends. Dress lightly, because heat increases itching.   A child with eczema should stay away from anyone with fever blisters or cold sores. The virus that causes fever blisters (herpes simplex) can cause a serious skin infection in children with eczema. SEEK MEDICAL CARE IF:   Your itching interferes with  sleep.   Your rash gets worse or is not better within 1 week after starting treatment.   You see pus or soft yellow scabs in the rash area.   You have a fever.   You have a rash flare-up after contact with someone who has fever blisters.  Document Released: 10/02/2000 Document Revised: 07/26/2013 Document Reviewed: 05/08/2013 [facility] Patient Information 2015 [place], [place]. This information is not intended to replace advice given to you by your health care provider. Make sure you discuss any questions you have with your health care provider.

## 2015-02-06 LAB — CYTOLOGY - PAP

## 2015-02-18 ENCOUNTER — Other Ambulatory Visit: Payer: Self-pay | Admitting: Family Medicine

## 2015-03-07 ENCOUNTER — Ambulatory Visit (INDEPENDENT_AMBULATORY_CARE_PROVIDER_SITE_OTHER): Payer: BLUE CROSS/BLUE SHIELD | Admitting: Family Medicine

## 2015-03-07 ENCOUNTER — Encounter: Payer: Self-pay | Admitting: Family Medicine

## 2015-03-07 VITALS — BP 130/84 | HR 87 | Temp 98.4°F | Wt 173.8 lb

## 2015-03-07 DIAGNOSIS — R079 Chest pain, unspecified: Secondary | ICD-10-CM

## 2015-03-07 DIAGNOSIS — L299 Pruritus, unspecified: Secondary | ICD-10-CM

## 2015-03-07 DIAGNOSIS — R21 Rash and other nonspecific skin eruption: Secondary | ICD-10-CM

## 2015-03-07 DIAGNOSIS — R0789 Other chest pain: Secondary | ICD-10-CM

## 2015-03-07 MED ORDER — HYDROXYZINE HCL 25 MG PO TABS: 12.5000 mg | ORAL_TABLET | Freq: Two times a day (BID) | ORAL | Status: DC | PRN

## 2015-03-07 MED ORDER — FLUCONAZOLE 150 MG PO TABS: 150.0000 mg | ORAL_TABLET | ORAL | Status: DC

## 2015-03-07 NOTE — Progress Notes (Signed)
BP 130/84 mmHg  Pulse 87  Temp(Src) 98.4 F (36.9 C) (Oral)  Wt 173 lb 12 oz (78.812 kg)  SpO2 96%   CC: check spot between breasts  Subjective:    Patient ID: Melanie Holloway, female    DOB: [birth year], 49 y.o.   MRN: [id number]  HPI: Melanie Holloway is a 49 y.o. female presenting on 03/07/2015 for Itching, sore between breasts   H/o breast cancer s/p L mastectomy with LN excision. Last seen here 12/2013.   Noticing over last 1 month increased itching throughout body - changed soaps without improvement. Currently using zest, prior irish spring. No new lotions/detergents/shampoos. No new medications or foods. No oral lesions, fevers/chills, nausea/vomiting, joint pains. Occasional R thumb pain/triggering.   Also noticing tenderness midline between two breasts. No new lesions on skin in this area. Denies inciting trauma/injury  Skin rash on legs ongoing over last few years, treated with high potency steroid cream without improvement. Last visit we discussed derm referral.   Relevant past medical, surgical, family and social history reviewed and updated as indicated. Interim medical history since our last visit reviewed. Allergies and medications reviewed and updated. Current Outpatient Prescriptions on File Prior to Visit  Medication Sig  . Cholecalciferol (VITAMIN D) 2000 UNITS CAPS Take 1 capsule (2,000 Units total) by mouth daily.  . hydrochlorothiazide (HYDRODIURIL) 25 MG tablet TAKE 1 TABLET BY MOUTH EVERY DAY  . letrozole (FEMARA) 2.5 MG tablet Take 1 tablet (2.5 mg total) by mouth daily.   No current facility-administered medications on file prior to visit.    Review of Systems Per HPI unless specifically indicated above     Objective:    BP 130/84 mmHg  Pulse 87  Temp(Src) 98.4 F (36.9 C) (Oral)  Wt 173 lb 12 oz (78.812 kg)  SpO2 96%  Wt Readings from Last 3 Encounters:  03/07/15 173 lb 12 oz (78.812 kg)  02/05/15 178 lb (80.74 kg)  07/19/14 180 lb 6.4 oz (81.829  kg)    Physical Exam  Constitutional: She appears well-developed and well-nourished. No distress.  Obese Body mass index is 35.07 kg/(m^2).   HENT:  Mouth/Throat: Oropharynx is clear and moist. No oropharyngeal exudate.  Dark macules on tongue - per pt present since chemo  Pulmonary/Chest: She exhibits tenderness.  Tender at sternoxiphoid joint without erythema or swelling  Musculoskeletal: She exhibits no edema.  Skin: Skin is warm and dry. Rash noted.  Pruritis throughout body Hyperpigmented patches bilateral lower legs - spreading noted from last year Dry annular pruritic patches on back and thicker patch under left axilla Hyperpigmentation bilateral axilla  Nursing note and vitals reviewed.      Assessment & Plan:   Problem List Items Addressed This Visit    Pruritic condition - Primary    Unclear cause. Discussed recommended lotions/soaps. Will start eval with CMP, CBC, ESR, TSH. In interim, treat pruritis with hydroxyzine 12.5-25mg  PRN.      Relevant Orders   Ambulatory referral to Dermatology   Skin rash    1. Leg rash - chronic, spreading. Recommended referral to derm for further eval and possibly biopsy - would want to r/o mycosis. 2. Back and axilla rash - possible tinea corporis vs eczematous dermatitis, as widespread will treat with systemic diflucan 150mg  weekly x 4 wks. Doubt pityriasis.      Relevant Orders   Comprehensive metabolic panel   CBC with Differential/Platelet   TSH   Sedimentation rate   ANA   Rheumatoid factor  Ambulatory referral to Dermatology   Sternal pain    Superior to where pain would be in xiphoidalgia - ?arthritis at sterno-xiphoid joint. rec treatment with aleve bid with food. Check for other systemic arthritis with ESR, ANA, RF.          Follow up plan: Return if symptoms worsen or fail to improve.

## 2015-03-07 NOTE — Assessment & Plan Note (Addendum)
Unclear cause. Discussed recommended lotions/soaps. Will start eval with CMP, CBC, ESR, TSH. In interim, treat pruritis with hydroxyzine 12.5-25mg PRN.

## 2015-03-07 NOTE — Assessment & Plan Note (Signed)
Superior to where pain would be in xiphoidalgia - ?arthritis at sterno-xiphoid joint. rec treatment with aleve bid with food. Check for other systemic arthritis with ESR, ANA, RF.

## 2015-03-07 NOTE — Patient Instructions (Addendum)
labwork today. For itchy rash - use mild dove soap, may use hydroxyzine 1/2-1 tablet as needed for itching, take antifungal sent to pharmacy.  Watch sternum tenderness - may use aleve as needed for pain (take with food) - should improve with time.  We will also refer you to dermatologist for further evaluation of lower extremity permanent rash Call us with questions.

## 2015-03-07 NOTE — Assessment & Plan Note (Signed)
1. Leg rash - chronic, spreading. Recommended referral to derm for further eval and possibly biopsy - would want to r/o mycosis. 2. Back and axilla rash - possible tinea corporis vs eczematous dermatitis, as widespread will treat with systemic diflucan 150mg  weekly x 4 wks. Doubt pityriasis.

## 2015-03-07 NOTE — Progress Notes (Signed)
Pre visit review using our clinic review tool, if applicable. No additional management support is needed unless otherwise documented below in the visit note. 

## 2015-03-08 LAB — CBC WITH DIFFERENTIAL/PLATELET
BASOS ABS: 0 10*3/uL (ref 0.0–0.1)
Basophils Relative: 0.6 % (ref 0.0–3.0)
EOS PCT: 3 % (ref 0.0–5.0)
Eosinophils Absolute: 0.1 10*3/uL (ref 0.0–0.7)
HCT: 29.5 % — ABNORMAL LOW (ref 36.0–46.0)
Hemoglobin: 10.2 g/dL — ABNORMAL LOW (ref 12.0–15.0)
LYMPHS ABS: 1.3 10*3/uL (ref 0.7–4.0)
Lymphocytes Relative: 37.2 % (ref 12.0–46.0)
MCHC: 34.6 g/dL (ref 30.0–36.0)
MCV: 83.8 fl (ref 78.0–100.0)
MONOS PCT: 8.5 % (ref 3.0–12.0)
Monocytes Absolute: 0.3 10*3/uL (ref 0.1–1.0)
NEUTROS PCT: 50.7 % (ref 43.0–77.0)
Neutro Abs: 1.8 10*3/uL (ref 1.4–7.7)
PLATELETS: 209 10*3/uL (ref 150.0–400.0)
RBC: 3.52 Mil/uL — AB (ref 3.87–5.11)
RDW: 14.1 % (ref 11.5–15.5)
WBC: 3.5 10*3/uL — AB (ref 4.0–10.5)

## 2015-03-08 LAB — COMPREHENSIVE METABOLIC PANEL
ALBUMIN: 3.5 g/dL (ref 3.5–5.2)
ALT: 17 U/L (ref 0–35)
AST: 24 U/L (ref 0–37)
Alkaline Phosphatase: 101 U/L (ref 39–117)
BUN: 21 mg/dL (ref 6–23)
CALCIUM: 9.7 mg/dL (ref 8.4–10.5)
CHLORIDE: 103 meq/L (ref 96–112)
CO2: 27 mEq/L (ref 19–32)
CREATININE: 0.9 mg/dL (ref 0.40–1.20)
GFR: 85.11 mL/min (ref >60.00)
GLUCOSE: 80 mg/dL (ref 70–99)
POTASSIUM: 3.3 meq/L — AB (ref 3.5–5.1)
SODIUM: 139 meq/L (ref 135–145)
Total Bilirubin: 0.3 mg/dL (ref 0.2–1.2)
Total Protein: 7.9 g/dL (ref 6.0–8.3)

## 2015-03-08 LAB — ANTI-NUCLEAR AB-TITER (ANA TITER)

## 2015-03-08 LAB — TSH: TSH: 1.27 u[IU]/mL (ref 0.35–4.50)

## 2015-03-08 LAB — SEDIMENTATION RATE: SED RATE: 96 mm/h — AB (ref 0–22)

## 2015-03-08 LAB — RHEUMATOID FACTOR

## 2015-03-08 LAB — ANA: ANA: POSITIVE — AB

## 2015-03-12 ENCOUNTER — Other Ambulatory Visit: Payer: Self-pay | Admitting: Family Medicine

## 2015-03-12 DIAGNOSIS — R21 Rash and other nonspecific skin eruption: Secondary | ICD-10-CM

## 2015-03-12 DIAGNOSIS — L299 Pruritus, unspecified: Secondary | ICD-10-CM

## 2015-03-12 DIAGNOSIS — R0789 Other chest pain: Secondary | ICD-10-CM

## 2015-03-27 ENCOUNTER — Other Ambulatory Visit: Payer: Self-pay | Admitting: Family Medicine

## 2015-04-20 ENCOUNTER — Other Ambulatory Visit: Payer: Self-pay | Admitting: Family Medicine

## 2015-04-25 ENCOUNTER — Other Ambulatory Visit: Payer: Self-pay | Admitting: Family Medicine

## 2015-04-30 ENCOUNTER — Encounter: Payer: Self-pay | Admitting: Genetic Counselor

## 2015-04-30 ENCOUNTER — Other Ambulatory Visit: Payer: Self-pay | Admitting: Family Medicine

## 2015-06-14 ENCOUNTER — Other Ambulatory Visit: Payer: Self-pay

## 2015-06-14 DIAGNOSIS — Z1231 Encounter for screening mammogram for malignant neoplasm of breast: Secondary | ICD-10-CM

## 2015-06-14 DIAGNOSIS — Z9012 Acquired absence of left breast and nipple: Secondary | ICD-10-CM

## 2015-06-25 ENCOUNTER — Telehealth: Payer: Self-pay | Admitting: Family Medicine

## 2015-06-25 NOTE — Telephone Encounter (Signed)
Pt left v/m requesting cb; question about HCTZ. Left v/m requesting cb.

## 2015-06-26 NOTE — Telephone Encounter (Signed)
Spoke with pt and she said HCTZ had already been called in and pt will cb for f/u appt. Pt said she gets her CPX with oncology.

## 2015-06-28 ENCOUNTER — Telehealth: Payer: Self-pay | Admitting: Oncology

## 2015-06-28 NOTE — Telephone Encounter (Signed)
Returned Advertising account executive. Confirmed all appointment changed for September due to patient changing jobs losing insurance

## 2015-07-08 ENCOUNTER — Telehealth: Payer: Self-pay | Admitting: Nurse Practitioner

## 2015-07-08 ENCOUNTER — Other Ambulatory Visit (HOSPITAL_BASED_OUTPATIENT_CLINIC_OR_DEPARTMENT_OTHER): Payer: BLUE CROSS/BLUE SHIELD

## 2015-07-08 DIAGNOSIS — C50512 Malignant neoplasm of lower-outer quadrant of left female breast: Secondary | ICD-10-CM

## 2015-07-08 LAB — CBC WITH DIFFERENTIAL/PLATELET
BASO%: 0.3 % (ref 0.0–2.0)
Basophils Absolute: 0 10*3/uL (ref 0.0–0.1)
EOS%: 1.6 % (ref 0.0–7.0)
Eosinophils Absolute: 0.1 10*3/uL (ref 0.0–0.5)
HCT: 30 % — ABNORMAL LOW (ref 34.8–46.6)
HGB: 10.4 g/dL — ABNORMAL LOW (ref 11.6–15.9)
LYMPH%: 36.5 % (ref 14.0–49.7)
MCH: 28 pg (ref 25.1–34.0)
MCHC: 34.7 g/dL (ref 31.5–36.0)
MCV: 80.6 fL (ref 79.5–101.0)
MONO#: 0.4 10*3/uL (ref 0.1–0.9)
MONO%: 10.6 % (ref 0.0–14.0)
NEUT%: 51 % (ref 38.4–76.8)
NEUTROS ABS: 2 10*3/uL (ref 1.5–6.5)
NRBC: 0 % (ref 0–0)
Platelets: 229 10*3/uL (ref 145–400)
RBC: 3.72 10*6/uL (ref 3.70–5.45)
RDW: 13.3 % (ref 11.2–14.5)
WBC: 3.9 10*3/uL (ref 3.9–10.3)
lymph#: 1.4 10*3/uL (ref 0.9–3.3)

## 2015-07-08 LAB — COMPREHENSIVE METABOLIC PANEL (CC13)
ALK PHOS: 92 U/L (ref 40–150)
ALT: 16 U/L (ref 0–55)
AST: 19 U/L (ref 5–34)
Albumin: 3.1 g/dL — ABNORMAL LOW (ref 3.5–5.0)
Anion Gap: 8 mEq/L (ref 3–11)
BUN: 27.7 mg/dL — AB (ref 7.0–26.0)
CALCIUM: 9.1 mg/dL (ref 8.4–10.4)
CO2: 28 mEq/L (ref 22–29)
CREATININE: 1.1 mg/dL (ref 0.6–1.1)
Chloride: 104 mEq/L (ref 98–109)
EGFR: 68 mL/min/{1.73_m2} — ABNORMAL LOW (ref >90)
Glucose: 80 mg/dl (ref 70–140)
Potassium: 3.3 mEq/L — ABNORMAL LOW (ref 3.5–5.1)
Sodium: 140 mEq/L (ref 136–145)
Total Bilirubin: 0.31 mg/dL (ref 0.20–1.20)
Total Protein: 7.2 g/dL (ref 6.4–8.3)

## 2015-07-08 NOTE — Telephone Encounter (Signed)
pt cld to r/s appt-gave r/s time & date 

## 2015-07-10 ENCOUNTER — Ambulatory Visit
Admission: RE | Admit: 2015-07-10 | Discharge: 2015-07-10 | Disposition: A | Discharge status: Home / Self Care | Payer: BLUE CROSS/BLUE SHIELD | Source: Ambulatory Visit

## 2015-07-10 ENCOUNTER — Other Ambulatory Visit: Payer: BLUE CROSS/BLUE SHIELD

## 2015-07-10 DIAGNOSIS — Z1231 Encounter for screening mammogram for malignant neoplasm of breast: Secondary | ICD-10-CM

## 2015-07-10 DIAGNOSIS — Z9012 Acquired absence of left breast and nipple: Secondary | ICD-10-CM

## 2015-07-11 LAB — HM MAMMOGRAPHY: HM MAMMO: NORMAL

## 2015-07-12 NOTE — Progress Notes (Signed)
Pt notified of lab results

## 2015-07-15 ENCOUNTER — Encounter: Payer: Self-pay | Admitting: *Deleted

## 2015-07-15 ENCOUNTER — Other Ambulatory Visit: Payer: BC Managed Care – PPO

## 2015-07-15 ENCOUNTER — Encounter: Payer: Self-pay | Admitting: Family Medicine

## 2015-07-18 ENCOUNTER — Encounter: Payer: Self-pay | Admitting: Nurse Practitioner

## 2015-07-18 ENCOUNTER — Ambulatory Visit (HOSPITAL_BASED_OUTPATIENT_CLINIC_OR_DEPARTMENT_OTHER): Payer: BLUE CROSS/BLUE SHIELD | Admitting: Nurse Practitioner

## 2015-07-18 ENCOUNTER — Telehealth: Payer: Self-pay | Admitting: Oncology

## 2015-07-18 VITALS — BP 143/86 | HR 93 | Temp 98.2°F | Resp 18 | Ht 59.0 in | Wt 179.3 lb

## 2015-07-18 DIAGNOSIS — C50512 Malignant neoplasm of lower-outer quadrant of left female breast: Secondary | ICD-10-CM | POA: Diagnosis not present

## 2015-07-18 DIAGNOSIS — D561 Beta thalassemia: Secondary | ICD-10-CM

## 2015-07-18 DIAGNOSIS — E876 Hypokalemia: Secondary | ICD-10-CM | POA: Diagnosis not present
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
## 2015-07-18 NOTE — Telephone Encounter (Signed)
Gave patient avs report and appointments for September 2017.

## 2015-07-22 ENCOUNTER — Ambulatory Visit: Payer: BC Managed Care – PPO | Admitting: Oncology

## 2015-07-23 ENCOUNTER — Ambulatory Visit: Payer: BLUE CROSS/BLUE SHIELD

## 2015-08-01 ENCOUNTER — Ambulatory Visit: Payer: BC Managed Care – PPO | Admitting: Oncology

## 2015-08-23 ENCOUNTER — Telehealth: Payer: Self-pay | Admitting: *Deleted

## 2015-08-23 NOTE — Telephone Encounter (Signed)
-----   Message from Francia Greaves sent at 08/22/2015  3:26 PM EDT ----- Regarding: Form Request Contact: [phone] Called and wants a form from [place] stating she had her physical here with [place]

## 2015-08-23 NOTE — Telephone Encounter (Signed)
Completed physical form for school, informed pt that she could pick it up on Monday.

## 2015-08-26 ENCOUNTER — Encounter: Payer: Self-pay | Admitting: *Deleted

## 2015-08-31 ENCOUNTER — Other Ambulatory Visit: Payer: Self-pay | Admitting: Family Medicine

## 2015-09-03 ENCOUNTER — Telehealth: Payer: Self-pay | Admitting: Family Medicine

## 2015-09-03 DIAGNOSIS — Z9189 Other specified personal risk factors, not elsewhere classified: Principal | ICD-10-CM

## 2015-09-03 DIAGNOSIS — Z2839 Other underimmunization status: Secondary | ICD-10-CM

## 2015-09-03 NOTE — Telephone Encounter (Signed)
Pt called wanting to know if she has had any of the below vaccine Hep b tentus variclla titer mmr titer If she has not had them  Pt needs to come in ASAP to have this done.  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

## 2015-09-04 ENCOUNTER — Other Ambulatory Visit (INDEPENDENT_AMBULATORY_CARE_PROVIDER_SITE_OTHER): Payer: Self-pay | Admitting: *Deleted

## 2015-09-04 DIAGNOSIS — Z2839 Other underimmunization status: Secondary | ICD-10-CM

## 2015-09-04 DIAGNOSIS — Z23 Encounter for immunization: Secondary | ICD-10-CM

## 2015-09-04 DIAGNOSIS — Z9189 Other specified personal risk factors, not elsewhere classified: Secondary | ICD-10-CM

## 2015-09-04 LAB — HEPATITIS B SURFACE ANTIBODY,QUALITATIVE: HEP B S AB: NEGATIVE

## 2015-09-04 NOTE — Telephone Encounter (Signed)
No records of any immunizations at our office. Recommend she come in for titers then will see what she needs. ordered

## 2015-09-04 NOTE — Telephone Encounter (Signed)
Patient notified and lab appt scheduled.  

## 2015-09-04 NOTE — Addendum Note (Signed)
Addended by: Ellamae Sia on: 09/04/2015 05:20 PM   Modules accepted: Orders

## 2015-09-05 LAB — RUBELLA SCREEN: Rubella: 30.7 Index — ABNORMAL HIGH (ref <0.90)

## 2015-09-05 LAB — VARICELLA ZOSTER ANTIBODY, IGG: Varicella IgG: 1237 Index — ABNORMAL HIGH (ref <135.00)

## 2015-09-05 LAB — RUBEOLA ANTIBODY IGG

## 2015-09-05 LAB — MUMPS ANTIBODY, IGG

## 2015-09-10 ENCOUNTER — Other Ambulatory Visit (INDEPENDENT_AMBULATORY_CARE_PROVIDER_SITE_OTHER): Payer: Self-pay

## 2015-09-10 DIAGNOSIS — Z23 Encounter for immunization: Secondary | ICD-10-CM

## 2015-09-16 ENCOUNTER — Ambulatory Visit (INDEPENDENT_AMBULATORY_CARE_PROVIDER_SITE_OTHER): Payer: Self-pay | Admitting: *Deleted

## 2015-09-16 DIAGNOSIS — Z111 Encounter for screening for respiratory tuberculosis: Secondary | ICD-10-CM

## 2015-09-18 LAB — TB SKIN TEST
Induration: 0 mm
TB Skin Test: NEGATIVE

## 2015-10-01 ENCOUNTER — Other Ambulatory Visit: Payer: Self-pay | Admitting: Family Medicine

## 2015-10-01 ENCOUNTER — Other Ambulatory Visit: Payer: Self-pay | Admitting: Oncology

## 2015-11-11 ENCOUNTER — Other Ambulatory Visit: Payer: Self-pay | Admitting: Family Medicine

## 2015-11-28 ENCOUNTER — Encounter: Payer: Self-pay | Admitting: Family Medicine

## 2015-11-28 DIAGNOSIS — M3214 Glomerular disease in systemic lupus erythematosus: Secondary | ICD-10-CM | POA: Insufficient documentation

## 2015-11-28 DIAGNOSIS — R809 Proteinuria, unspecified: Secondary | ICD-10-CM

## 2015-11-28 HISTORY — DX: Proteinuria, unspecified: R80.9

## 2016-02-22 ENCOUNTER — Other Ambulatory Visit: Payer: Self-pay | Admitting: Family Medicine

## 2016-02-27 ENCOUNTER — Ambulatory Visit (INDEPENDENT_AMBULATORY_CARE_PROVIDER_SITE_OTHER): Payer: Commercial Managed Care - HMO | Admitting: Family Medicine

## 2016-02-27 ENCOUNTER — Encounter: Payer: Self-pay | Admitting: Family Medicine

## 2016-02-27 VITALS — BP 144/100 | HR 78 | Temp 98.0°F | Wt 183.8 lb

## 2016-02-27 DIAGNOSIS — R809 Proteinuria, unspecified: Secondary | ICD-10-CM

## 2016-02-27 DIAGNOSIS — I10 Essential (primary) hypertension: Secondary | ICD-10-CM | POA: Diagnosis not present

## 2016-02-27 MED ORDER — LOSARTAN POTASSIUM-HCTZ 50-12.5 MG PO TABS: 1.0000 | ORAL_TABLET | Freq: Every day | ORAL | Status: DC

## 2016-02-27 NOTE — Progress Notes (Addendum)
BP 144/100 mmHg  Pulse 78  Temp(Src) 98 F (36.7 C) (Oral)  Wt 183 lb 12 oz (83.348 kg)  SpO2 99%   CC: review meds  Subjective:    Patient ID: Melanie Holloway, female    DOB: May 09, 1966, 50 y.o.   MRN: [id number]  HPI: Melanie Holloway is a 50 y.o. female presenting on 02/27/2016 for Discuss medications   Last seen here 02/2015.  Followed by Dr Juleen China for proteinuria with +ANA, thought likely from hypertensive nephropathy and not lupus nephropathy. Given stability of readings, no need for kidney biopsy at this time. Last seen 02/17/2016. At that time, had run out of hctz and was only taking clonidine pill once daily.  HTN - currently taking antihypertensive regimen of losartan 25mg  daily, clonidine 0.2mg  once daily. Not taking HCTZ or clonidine BID. Does not regularly check blood pressures at home. No low blood pressure readings or symptoms of dizziness/syncope. Denies HA, vision changes, CP/tightness, SOB, leg swelling.   Relevant past medical, surgical, family and social history reviewed and updated as indicated. Interim medical history since our last visit reviewed. Allergies and medications reviewed and updated. Current Outpatient Prescriptions on File Prior to Visit  Medication Sig  . Cholecalciferol (VITAMIN D) 2000 UNITS CAPS Take 1 capsule (2,000 Units total) by mouth daily.  . cloNIDine (CATAPRES) 0.2 MG tablet Take 1 tablet (0.2 mg total) by mouth 2 (two) times daily.  Marland Kitchen letrozole (FEMARA) 2.5 MG tablet TAKE 1 TABLET (2.5 MG TOTAL) BY MOUTH DAILY.   No current facility-administered medications on file prior to visit.    Review of Systems Per HPI unless specifically indicated in ROS section     Objective:    BP 144/100 mmHg  Pulse 78  Temp(Src) 98 F (36.7 C) (Oral)  Wt 183 lb 12 oz (83.348 kg)  SpO2 99%  Wt Readings from Last 3 Encounters:  02/27/16 183 lb 12 oz (83.348 kg)  07/18/15 179 lb 4.8 oz (81.33 kg)  03/07/15 173 lb 12 oz (78.812 kg)    Physical Exam   Constitutional: She appears well-developed and well-nourished. No distress.  HENT:  Mouth/Throat: Oropharynx is clear and moist. No oropharyngeal exudate.  Cardiovascular: Normal rate, regular rhythm, normal heart sounds and intact distal pulses.   No murmur heard. Pulmonary/Chest: Effort normal and breath sounds normal. No respiratory distress. She has no wheezes. She has no rales.  Musculoskeletal: She exhibits no edema.  Skin: Skin is warm and dry. No rash noted.  Psychiatric: She has a normal mood and affect.  Nursing note and vitals reviewed.      Assessment & Plan:   Problem List Items Addressed This Visit    Essential hypertension - Primary    Not at goal. She has only been taking losartan 25mg  and clonidine 0.2mg  once daily, has not restarted hctz. Hesitant to be on 3 bp meds. Will start losartan/hctz 50/12.5mg  combo pill. Take daily for next 1-2 weeks, if bp remaining elevated, agrees to add on clonidine 0.2mg  BID (would be on total 2 meds, one being a combo pill).  Return 2 mo f/u visit. labwork today. Will fax today's note and labs attn Dr Juleen China.  Discussed low salt diet as well as salt alternatives, provided with DASH diet handout.       Relevant Medications   losartan-hydrochlorothiazide (HYZAAR) 50-12.5 MG tablet   Other Relevant Orders   Renal function panel   Proteinuria    Appreciate renal care of patient.  Follow up plan: Return in about 2 months (around 04/28/2016), or as needed, for follow up visit.  Ria Bush, MD

## 2016-02-27 NOTE — Assessment & Plan Note (Addendum)
Not at goal. She has only been taking losartan 25mg  and clonidine 0.2mg  once daily, has not restarted hctz. Hesitant to be on 3 bp meds. Will start losartan/hctz 50/12.5mg  combo pill. Take daily for next 1-2 weeks, if bp remaining elevated, agrees to add on clonidine 0.2mg  BID (would be on total 2 meds, one being a combo pill).  Return 2 mo f/u visit. labwork today. Will fax today's note and labs attn Dr Juleen China.  Discussed low salt diet as well as salt alternatives, provided with DASH diet handout.

## 2016-02-27 NOTE — Progress Notes (Signed)
Pre visit review using our clinic review tool, if applicable. No additional management support is needed unless otherwise documented below in the visit note. 

## 2016-02-27 NOTE — Patient Instructions (Addendum)
Look at Holly below. Work on decreased salt in the diet - don't cook with salt, use salt alternatives like herbs, Ms Deliah Boston, or no-salt salt. Pepper is ok as well.  Hold clonidine for now. Start losartan hctz 50/12.5mg  daily. Monitor blood pressure over next 2 weeks. If consistently >140/90, start clonidine 0.2mg  twice daily.  We will fax office visit and lab results to Dr Juleen China.  Follow up in 2 months.  DASH Eating Plan DASH stands for "Dietary Approaches to Stop Hypertension." The DASH eating plan is a healthy eating plan that has been shown to reduce high blood pressure (hypertension). Additional health benefits may include reducing the risk of type 2 diabetes mellitus, heart disease, and stroke. The DASH eating plan may also help with weight loss. WHAT DO I NEED TO KNOW ABOUT THE DASH EATING PLAN? For the DASH eating plan, you will follow these general guidelines:  Choose foods with a percent daily value for sodium of less than 5% (as listed on the food label).  Use salt-free seasonings or herbs instead of table salt or sea salt.  Check with your health care provider or pharmacist before using salt substitutes.  Eat lower-sodium products, often labeled as "lower sodium" or "no salt added."  Eat fresh foods.  Eat more vegetables, fruits, and low-fat dairy products.  Choose whole grains. Look for the word "whole" as the first word in the ingredient list.  Choose fish and skinless chicken or [place] more often than red meat. Limit fish, poultry, and meat to 6 oz (170 g) each day.  Limit sweets, desserts, sugars, and sugary drinks.  Choose heart-healthy fats.  Limit cheese to 1 oz (28 g) per day.  Eat more home-cooked food and less restaurant, buffet, and fast food.  Limit fried foods.  Cook foods using methods other than frying.  Limit canned vegetables. If you do use them, rinse them well to decrease the sodium.  When eating at a restaurant, ask that your food be  prepared with less salt, or no salt if possible. WHAT FOODS CAN I EAT? Seek help from a dietitian for individual calorie needs. Grains Whole grain or whole wheat bread. Brown rice. Whole grain or whole wheat pasta. Quinoa, bulgur, and whole grain cereals. Low-sodium cereals. Corn or whole wheat flour tortillas. Whole grain cornbread. Whole grain crackers. Low-sodium crackers. Vegetables Fresh or frozen vegetables (raw, steamed, roasted, or grilled). Low-sodium or reduced-sodium tomato and vegetable juices. Low-sodium or reduced-sodium tomato sauce and paste. Low-sodium or reduced-sodium canned vegetables.  Fruits All fresh, canned (in natural juice), or frozen fruits. Meat and Other Protein Products Ground beef (85% or leaner), grass-fed beef, or beef trimmed of fat. Skinless chicken or [place]. Ground chicken or [place]. Pork trimmed of fat. All fish and seafood. Eggs. Dried beans, peas, or lentils. Unsalted nuts and seeds. Unsalted canned beans. Dairy Low-fat dairy products, such as skim or 1% milk, 2% or reduced-fat cheeses, low-fat ricotta or cottage cheese, or plain low-fat yogurt. Low-sodium or reduced-sodium cheeses. Fats and Oils Tub margarines without trans fats. Light or reduced-fat mayonnaise and salad dressings (reduced sodium). Avocado. Safflower, olive, or canola oils. Natural peanut or almond butter. Other Unsalted popcorn and pretzels. The items listed above may not be a complete list of recommended foods or beverages. Contact your dietitian for more options. WHAT FOODS ARE NOT RECOMMENDED? Grains White bread. White pasta. White rice. Refined cornbread. Bagels and croissants. Crackers that contain trans fat. Vegetables Creamed or fried vegetables. Vegetables in a  cheese sauce. Regular canned vegetables. Regular canned tomato sauce and paste. Regular tomato and vegetable juices. Fruits Dried fruits. Canned fruit in light or heavy syrup. Fruit juice. Meat and Other Protein  Products Fatty cuts of meat. Ribs, chicken wings, bacon, sausage, bologna, salami, chitterlings, fatback, hot dogs, bratwurst, and packaged luncheon meats. Salted nuts and seeds. Canned beans with salt. Dairy Whole or 2% milk, cream, half-and-half, and cream cheese. Whole-fat or sweetened yogurt. Full-fat cheeses or blue cheese. Nondairy creamers and whipped toppings. Processed cheese, cheese spreads, or cheese curds. Condiments Onion and garlic salt, seasoned salt, table salt, and sea salt. Canned and packaged gravies. Worcestershire sauce. Tartar sauce. Barbecue sauce. Teriyaki sauce. Soy sauce, including reduced sodium. Steak sauce. Fish sauce. Oyster sauce. Cocktail sauce. Horseradish. Ketchup and mustard. Meat flavorings and tenderizers. Bouillon cubes. Hot sauce. Tabasco sauce. Marinades. Taco seasonings. Relishes. Fats and Oils Butter, stick margarine, lard, shortening, ghee, and bacon fat. Coconut, palm kernel, or palm oils. Regular salad dressings. Other Pickles and olives. Salted popcorn and pretzels. The items listed above may not be a complete list of foods and beverages to avoid. Contact your dietitian for more information. WHERE CAN I FIND MORE INFORMATION? National Heart, Lung, and [employer] Institute: travelstabloid.com   This information is not intended to replace advice given to you by your health care provider. Make sure you discuss any questions you have with your health care provider.   Document Released: 09/24/2011 Document Revised: 10/26/2014 Document Reviewed: 08/09/2013 Elsevier Interactive Patient Education [employer].

## 2016-02-27 NOTE — Assessment & Plan Note (Signed)
Appreciate renal care of patient.

## 2016-02-28 LAB — RENAL FUNCTION PANEL
ALBUMIN: 3.6 g/dL (ref 3.5–5.2)
BUN: 22 mg/dL (ref 6–23)
CHLORIDE: 104 meq/L (ref 96–112)
CO2: 26 meq/L (ref 19–32)
CREATININE: 1.34 mg/dL — AB (ref 0.40–1.20)
Calcium: 9 mg/dL (ref 8.4–10.5)
GFR: 53.56 mL/min — ABNORMAL LOW (ref >60.00)
Glucose, Bld: 83 mg/dL (ref 70–99)
PHOSPHORUS: 4.5 mg/dL (ref 2.3–4.6)
Potassium: 3.6 mEq/L (ref 3.5–5.1)
Sodium: 138 mEq/L (ref 135–145)

## 2016-03-02 ENCOUNTER — Encounter: Payer: Self-pay | Admitting: *Deleted

## 2016-04-27 ENCOUNTER — Ambulatory Visit: Payer: Commercial Managed Care - HMO | Admitting: Family Medicine

## 2016-05-08 ENCOUNTER — Telehealth: Payer: Self-pay | Admitting: Oncology

## 2016-05-08 NOTE — Telephone Encounter (Signed)
Appointment confirmed with patient. Melanie Holloway °

## 2016-05-14 ENCOUNTER — Encounter: Payer: Self-pay | Admitting: Family Medicine

## 2016-05-14 ENCOUNTER — Ambulatory Visit (INDEPENDENT_AMBULATORY_CARE_PROVIDER_SITE_OTHER): Payer: Commercial Managed Care - HMO | Admitting: Family Medicine

## 2016-05-14 DIAGNOSIS — R809 Proteinuria, unspecified: Secondary | ICD-10-CM | POA: Diagnosis not present

## 2016-05-14 DIAGNOSIS — I10 Essential (primary) hypertension: Secondary | ICD-10-CM

## 2016-05-14 NOTE — Progress Notes (Signed)
Pre visit review using our clinic review tool, if applicable. No additional management support is needed unless otherwise documented below in the visit note. 

## 2016-05-14 NOTE — Assessment & Plan Note (Signed)
Chronic, uncontrolled but slowly improving. Pt did not start clonidine. Agrees to start, but only once daily. Will continue losartan/hctz 50/12.5mg  daily, add clonidine 0.2mg  nightly. Advised if bp remaining consistently >130/80, to increase clonidine to 0.2mg  bid. Recheck in 3 months at f/u visit.

## 2016-05-14 NOTE — Patient Instructions (Addendum)
Blood pressure is better but still not at goal.  Continue losartan /hctz 50/12.5mg  in the morning. Add clonidine 0.2mg  at night time. If blood pressure isn't staying <130/80, may increase clonidine to 0.2mg  twice daily.  RTC first half of October for physical with fasitng labs.

## 2016-05-14 NOTE — Progress Notes (Signed)
BP (!) 152/96 (BP Location: Right Arm, Cuff Size: Large)   Pulse 90   Temp 98.4 F (36.9 C) (Oral)   Wt 178 lb 8 oz (81 kg)   SpO2 98%   BMI 36.05 kg/m    CC: 2 mo f/u visit Subjective:    Patient ID: Melanie Holloway, female    DOB: [birth year], 50 y.o.   MRN: [id number]  HPI: Melanie Holloway is a 50 y.o. female presenting on 05/14/2016 for 2 mos FU   HTN - Compliant with current antihypertensive regimen of losartan/hctz 50/12.5mg  daily. Does check blood pressures at home: 130-150/78-90s. No low blood pressure readings or symptoms of dizziness/syncope.  Denies HA, vision changes, CP/tightness, SOB, leg swelling.    She has returned to see Dr Juleen China, states kidneys stable.   Relevant past medical, surgical, family and social history reviewed and updated as indicated. Interim medical history since our last visit reviewed. Allergies and medications reviewed and updated. Current Outpatient Prescriptions on File Prior to Visit  Medication Sig  . Cholecalciferol (VITAMIN D) 2000 UNITS CAPS Take 1 capsule (2,000 Units total) by mouth daily.  . cloNIDine (CATAPRES) 0.2 MG tablet Take 0.2 mg by mouth at bedtime.   Marland Kitchen letrozole (FEMARA) 2.5 MG tablet TAKE 1 TABLET (2.5 MG TOTAL) BY MOUTH DAILY.  Marland Kitchen losartan-hydrochlorothiazide (HYZAAR) 50-12.5 MG tablet Take 1 tablet by mouth daily.   No current facility-administered medications on file prior to visit.     Review of Systems Per HPI unless specifically indicated in ROS section     Objective:    BP (!) 152/96 (BP Location: Right Arm, Cuff Size: Large)   Pulse 90   Temp 98.4 F (36.9 C) (Oral)   Wt 178 lb 8 oz (81 kg)   SpO2 98%   BMI 36.05 kg/m   Wt Readings from Last 3 Encounters:  05/14/16 178 lb 8 oz (81 kg)  02/27/16 183 lb 12 oz (83.3 kg)  07/18/15 179 lb 4.8 oz (81.3 kg)    Physical Exam  Constitutional: She appears well-developed and well-nourished. No distress.  HENT:  Mouth/Throat: Oropharynx is clear and moist. No  oropharyngeal exudate.  Cardiovascular: Normal rate, regular rhythm, normal heart sounds and intact distal pulses.   No murmur heard. Pulmonary/Chest: Effort normal and breath sounds normal. No respiratory distress. She has no wheezes. She has no rales.  Musculoskeletal: She exhibits no edema.  Skin: Skin is warm and dry. No rash noted.  Psychiatric: She has a normal mood and affect.  Nursing note and vitals reviewed.  Results for orders placed or performed in visit on 02/27/16  Renal function panel  Result Value Ref Range   Sodium 138 135 - 145 mEq/L   Potassium 3.6 3.5 - 5.1 mEq/L   Chloride 104 96 - 112 mEq/L   CO2 26 19 - 32 mEq/L   Calcium 9.0 8.4 - 10.5 mg/dL   Albumin 3.6 3.5 - 5.2 g/dL   BUN 22 6 - 23 mg/dL   Creatinine, Ser 1.34 (H) 0.40 - 1.20 mg/dL   Glucose, Bld 83 70 - 99 mg/dL   Phosphorus 4.5 2.3 - 4.6 mg/dL   GFR 53.56 (L) >60.00 mL/min      Assessment & Plan:   Problem List Items Addressed This Visit    Essential hypertension    Chronic, uncontrolled but slowly improving. Pt did not start clonidine. Agrees to start, but only once daily. Will continue losartan/hctz 50/12.5mg  daily, add clonidine 0.2mg  nightly. Advised if bp  remaining consistently >130/80, to increase clonidine to 0.2mg  bid. Recheck in 3 months at f/u visit.       Proteinuria    Appreciate renal care. Stable period. Working towards optimizing bp control. Proteinuria thought due to HTN and not SLE with h/o +ANA.        Other Visit Diagnoses   None.      Follow up plan: Return in about 3 months (around 08/02/2016) for annual exam, prior fasting for blood work.  Ria Bush, MD

## 2016-05-14 NOTE — Assessment & Plan Note (Addendum)
Appreciate renal care. Stable period. Working towards optimizing bp control. Proteinuria thought due to HTN and not SLE with h/o +ANA.

## 2016-05-29 ENCOUNTER — Other Ambulatory Visit: Payer: Self-pay | Admitting: *Deleted

## 2016-05-29 MED ORDER — LETROZOLE 2.5 MG PO TABS: 2.5000 mg | ORAL_TABLET | Freq: Every day | ORAL | 0 refills | Status: DC

## 2016-06-03 ENCOUNTER — Other Ambulatory Visit: Payer: Self-pay | Admitting: *Deleted

## 2016-06-05 ENCOUNTER — Other Ambulatory Visit: Payer: Self-pay | Admitting: Oncology

## 2016-06-05 DIAGNOSIS — Z1231 Encounter for screening mammogram for malignant neoplasm of breast: Secondary | ICD-10-CM

## 2016-07-08 ENCOUNTER — Other Ambulatory Visit: Payer: Self-pay | Admitting: *Deleted

## 2016-07-08 DIAGNOSIS — C50512 Malignant neoplasm of lower-outer quadrant of left female breast: Secondary | ICD-10-CM

## 2016-07-09 ENCOUNTER — Other Ambulatory Visit (HOSPITAL_BASED_OUTPATIENT_CLINIC_OR_DEPARTMENT_OTHER): Payer: Commercial Managed Care - HMO

## 2016-07-09 ENCOUNTER — Other Ambulatory Visit: Payer: BLUE CROSS/BLUE SHIELD

## 2016-07-09 ENCOUNTER — Ambulatory Visit
Admission: RE | Admit: 2016-07-09 | Discharge: 2016-07-09 | Disposition: A | Discharge status: Home / Self Care | Payer: Commercial Managed Care - HMO | Source: Ambulatory Visit | Attending: Oncology | Admitting: Oncology

## 2016-07-09 DIAGNOSIS — C50512 Malignant neoplasm of lower-outer quadrant of left female breast: Secondary | ICD-10-CM

## 2016-07-09 DIAGNOSIS — Z1231 Encounter for screening mammogram for malignant neoplasm of breast: Secondary | ICD-10-CM

## 2016-07-09 LAB — COMPREHENSIVE METABOLIC PANEL
ALBUMIN: 3.2 g/dL — AB (ref 3.5–5.0)
ALK PHOS: 109 U/L (ref 40–150)
ALT: 13 U/L (ref 0–55)
ANION GAP: 10 meq/L (ref 3–11)
AST: 23 U/L (ref 5–34)
BILIRUBIN TOTAL: 0.42 mg/dL (ref 0.20–1.20)
BUN: 26.1 mg/dL — ABNORMAL HIGH (ref 7.0–26.0)
CO2: 23 meq/L (ref 22–29)
CREATININE: 1.1 mg/dL (ref 0.6–1.1)
Calcium: 9.2 mg/dL (ref 8.4–10.4)
Chloride: 104 mEq/L (ref 98–109)
EGFR: 66 mL/min/{1.73_m2} — ABNORMAL LOW (ref >90)
GLUCOSE: 94 mg/dL (ref 70–140)
Potassium: 3.5 mEq/L (ref 3.5–5.1)
SODIUM: 137 meq/L (ref 136–145)
TOTAL PROTEIN: 8.3 g/dL (ref 6.4–8.3)

## 2016-07-09 LAB — CBC WITH DIFFERENTIAL/PLATELET
BASO%: 0.3 % (ref 0.0–2.0)
Basophils Absolute: 0 10*3/uL (ref 0.0–0.1)
EOS ABS: 0 10*3/uL (ref 0.0–0.5)
EOS%: 0.3 % (ref 0.0–7.0)
HCT: 29.9 % — ABNORMAL LOW (ref 34.8–46.6)
HEMOGLOBIN: 10.2 g/dL — AB (ref 11.6–15.9)
LYMPH%: 30.6 % (ref 14.0–49.7)
MCH: 27.4 pg (ref 25.1–34.0)
MCHC: 34 g/dL (ref 31.5–36.0)
MCV: 80.7 fL (ref 79.5–101.0)
MONO#: 0.5 10*3/uL (ref 0.1–0.9)
MONO%: 11.5 % (ref 0.0–14.0)
NEUT%: 57.3 % (ref 38.4–76.8)
NEUTROS ABS: 2.4 10*3/uL (ref 1.5–6.5)
PLATELETS: 216 10*3/uL (ref 145–400)
RBC: 3.71 10*6/uL (ref 3.70–5.45)
RDW: 13.5 % (ref 11.2–14.5)
WBC: 4.1 10*3/uL (ref 3.9–10.3)
lymph#: 1.3 10*3/uL (ref 0.9–3.3)

## 2016-07-16 ENCOUNTER — Ambulatory Visit (HOSPITAL_BASED_OUTPATIENT_CLINIC_OR_DEPARTMENT_OTHER): Payer: Commercial Managed Care - HMO | Admitting: Oncology

## 2016-07-16 ENCOUNTER — Other Ambulatory Visit: Payer: BLUE CROSS/BLUE SHIELD

## 2016-07-16 ENCOUNTER — Encounter: Payer: Self-pay | Admitting: Oncology

## 2016-07-16 VITALS — BP 160/96 | HR 92 | Temp 98.3°F | Resp 18 | Ht 59.0 in | Wt 175.3 lb

## 2016-07-16 DIAGNOSIS — C50512 Malignant neoplasm of lower-outer quadrant of left female breast: Secondary | ICD-10-CM

## 2016-07-16 DIAGNOSIS — D649 Anemia, unspecified: Secondary | ICD-10-CM

## 2016-07-16 DIAGNOSIS — Z853 Personal history of malignant neoplasm of breast: Secondary | ICD-10-CM | POA: Diagnosis not present

## 2016-07-16 MED ORDER — CLONIDINE HCL 0.1 MG/24HR TD PTWK: 0.1000 mg | MEDICATED_PATCH | TRANSDERMAL | 12 refills | Status: DC
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
## 2016-07-28 ENCOUNTER — Other Ambulatory Visit (INDEPENDENT_AMBULATORY_CARE_PROVIDER_SITE_OTHER): Payer: Commercial Managed Care - HMO

## 2016-07-28 ENCOUNTER — Other Ambulatory Visit: Payer: Self-pay | Admitting: Family Medicine

## 2016-07-28 DIAGNOSIS — I10 Essential (primary) hypertension: Secondary | ICD-10-CM

## 2016-07-28 DIAGNOSIS — E559 Vitamin D deficiency, unspecified: Secondary | ICD-10-CM | POA: Diagnosis not present

## 2016-07-28 LAB — LIPID PANEL
CHOLESTEROL: 150 mg/dL (ref 0–200)
HDL: 25.8 mg/dL — ABNORMAL LOW (ref >39.00)
LDL Cholesterol: 87 mg/dL (ref 0–99)
NonHDL: 124.26
Total CHOL/HDL Ratio: 6
Triglycerides: 187 mg/dL — ABNORMAL HIGH (ref 0.0–149.0)
VLDL: 37.4 mg/dL (ref 0.0–40.0)

## 2016-07-28 LAB — VITAMIN D 25 HYDROXY (VIT D DEFICIENCY, FRACTURES): VITD: 46.01 ng/mL (ref 30.00–100.00)

## 2016-08-07 ENCOUNTER — Encounter: Payer: Self-pay | Admitting: Family Medicine

## 2016-08-07 ENCOUNTER — Ambulatory Visit (INDEPENDENT_AMBULATORY_CARE_PROVIDER_SITE_OTHER)
Admission: RE | Admit: 2016-08-07 | Discharge: 2016-08-07 | Disposition: A | Discharge status: Home / Self Care | Payer: Commercial Managed Care - HMO | Source: Ambulatory Visit | Attending: Family Medicine | Admitting: Family Medicine

## 2016-08-07 ENCOUNTER — Ambulatory Visit (INDEPENDENT_AMBULATORY_CARE_PROVIDER_SITE_OTHER): Payer: Commercial Managed Care - HMO | Admitting: Family Medicine

## 2016-08-07 VITALS — BP 142/94 | HR 88 | Temp 98.4°F | Ht <= 58 in | Wt 175.0 lb

## 2016-08-07 DIAGNOSIS — I10 Essential (primary) hypertension: Secondary | ICD-10-CM

## 2016-08-07 DIAGNOSIS — S92354A Nondisplaced fracture of fifth metatarsal bone, right foot, initial encounter for closed fracture: Secondary | ICD-10-CM

## 2016-08-07 DIAGNOSIS — Z0001 Encounter for general adult medical examination with abnormal findings: Secondary | ICD-10-CM | POA: Insufficient documentation

## 2016-08-07 DIAGNOSIS — Z0000 Encounter for general adult medical examination without abnormal findings: Secondary | ICD-10-CM | POA: Diagnosis not present

## 2016-08-07 DIAGNOSIS — Z170 Estrogen receptor positive status [ER+]: Secondary | ICD-10-CM

## 2016-08-07 DIAGNOSIS — S99921A Unspecified injury of right foot, initial encounter: Secondary | ICD-10-CM

## 2016-08-07 DIAGNOSIS — D561 Beta thalassemia: Secondary | ICD-10-CM

## 2016-08-07 DIAGNOSIS — E559 Vitamin D deficiency, unspecified: Secondary | ICD-10-CM

## 2016-08-07 DIAGNOSIS — C50512 Malignant neoplasm of lower-outer quadrant of left female breast: Secondary | ICD-10-CM

## 2016-08-07 DIAGNOSIS — R809 Proteinuria, unspecified: Secondary | ICD-10-CM

## 2016-08-07 DIAGNOSIS — E669 Obesity, unspecified: Secondary | ICD-10-CM

## 2016-08-07 HISTORY — DX: Nondisplaced fracture of fifth metatarsal bone, right foot, initial encounter for closed fracture: S92.354A

## 2016-08-07 MED ORDER — CLONIDINE HCL 0.2 MG/24HR TD PTWK: 0.2000 mg | MEDICATED_PATCH | TRANSDERMAL | 3 refills | Status: DC

## 2016-08-07 NOTE — Assessment & Plan Note (Addendum)
Anticipate bony bruise - check films to r/o 5th MT shaft fracture - films with evidence of small nondisplaced fracture - will place in post op shoe x 4 wks. Discussed rest, ice, elevation of foot. Update if not improving with treatment.

## 2016-08-07 NOTE — Assessment & Plan Note (Signed)
Discussed healthy diet and lifestyle changes to affect sustainable weight loss. Continue regular walking routine

## 2016-08-07 NOTE — Assessment & Plan Note (Signed)
Has graduated from onc surveillance. Needs yearly mammogram/breast exam.

## 2016-08-07 NOTE — Assessment & Plan Note (Signed)
Has not recently seen [name]

## 2016-08-07 NOTE — Progress Notes (Signed)
Pre visit review using our clinic review tool, if applicable. No additional management support is needed unless otherwise documented below in the visit note. 

## 2016-08-07 NOTE — Assessment & Plan Note (Signed)
Chronic, slightly elevated in office and at home.  Increase clonidine to next higher strength.

## 2016-08-07 NOTE — Assessment & Plan Note (Signed)
Preventative protocols reviewed and updated unless pt declined. Discussed healthy diet and lifestyle.  

## 2016-08-07 NOTE — Patient Instructions (Signed)
You are doing well today.  Let's increase clonidine to next dose. I have sent this in to your pharmacy. Return as needed or in 6 months for follow up.   Health Maintenance, Female Adopting a healthy lifestyle and getting preventive care can go a long way to promote health and wellness. Talk with your health care provider about what schedule of regular examinations is right for you. This is a good chance for you to check in with your provider about disease prevention and staying healthy. In between checkups, there are plenty of things you can do on your own. Experts have done a lot of research about which lifestyle changes and preventive measures are most likely to keep you healthy. Ask your health care provider for more information. WEIGHT AND DIET  Eat a healthy diet  Be sure to include plenty of vegetables, fruits, low-fat dairy products, and lean protein.  Do not eat a lot of foods high in solid fats, added sugars, or salt.  Get regular exercise. This is one of the most important things you can do for your health.  Most adults should exercise for at least 150 minutes each week. The exercise should increase your heart rate and make you sweat (moderate-intensity exercise).  Most adults should also do strengthening exercises at least twice a week. This is in addition to the moderate-intensity exercise.  Maintain a healthy weight  Body mass index (BMI) is a measurement that can be used to identify possible weight problems. It estimates body fat based on height and weight. Your health care provider can help determine your BMI and help you achieve or maintain a healthy weight.  For females 17 years of age and older:   A BMI below 18.5 is considered underweight.  A BMI of 18.5 to 24.9 is normal.  A BMI of 25 to 29.9 is considered overweight.  A BMI of 30 and above is considered obese.  Watch levels of cholesterol and blood lipids  You should start having your blood tested for lipids  and cholesterol at 51 years of age, then have this test every 5 years.  You may need to have your cholesterol levels checked more often if:  Your lipid or cholesterol levels are high.  You are older than 51 years of age.  You are at high risk for heart disease.  CANCER SCREENING   Lung Cancer  Lung cancer screening is recommended for adults 44-16 years old who are at high risk for lung cancer because of a history of smoking.  A yearly low-dose CT scan of the lungs is recommended for people who:  Currently smoke.  Have quit within the past 15 years.  Have at least a 30-pack-year history of smoking. A pack year is smoking an average of one pack of cigarettes a day for 1 year.  Yearly screening should continue until it has been 15 years since you quit.  Yearly screening should stop if you develop a health problem that would prevent you from having lung cancer treatment.  Breast Cancer  Practice breast self-awareness. This means understanding how your breasts normally appear and feel.  It also means doing regular breast self-exams. Let your health care provider know about any changes, no matter how small.  If you are in your 20s or 30s, you should have a clinical breast exam (CBE) by a health care provider every 1-3 years as part of a regular health exam.  If you are 73 or older, have a CBE every  year. Also consider having a breast X-ray (mammogram) every year.  If you have a family history of breast cancer, talk to your health care provider about genetic screening.  If you are at high risk for breast cancer, talk to your health care provider about having an MRI and a mammogram every year.  Breast cancer gene (BRCA) assessment is recommended for women who have family members with BRCA-related cancers. BRCA-related cancers include:  Breast.  Ovarian.  Tubal.  Peritoneal cancers.  Results of the assessment will determine the need for genetic counseling and BRCA1 and  BRCA2 testing. Cervical Cancer Your health care provider may recommend that you be screened regularly for cancer of the pelvic organs (ovaries, uterus, and vagina). This screening involves a pelvic examination, including checking for microscopic changes to the surface of your cervix (Pap test). You may be encouraged to have this screening done every 3 years, beginning at age 21.  For women ages 30-65, health care providers may recommend pelvic exams and Pap testing every 3 years, or they may recommend the Pap and pelvic exam, combined with testing for human papilloma virus (HPV), every 5 years. Some types of HPV increase your risk of cervical cancer. Testing for HPV may also be done on women of any age with unclear Pap test results.  Other health care providers may not recommend any screening for nonpregnant women who are considered low risk for pelvic cancer and who do not have symptoms. Ask your health care provider if a screening pelvic exam is right for you.  If you have had past treatment for cervical cancer or a condition that could lead to cancer, you need Pap tests and screening for cancer for at least 20 years after your treatment. If Pap tests have been discontinued, your risk factors (such as having a new sexual partner) need to be reassessed to determine if screening should resume. Some women have medical problems that increase the chance of getting cervical cancer. In these cases, your health care provider may recommend more frequent screening and Pap tests. Colorectal Cancer  This type of cancer can be detected and often prevented.  Routine colorectal cancer screening usually begins at 50 years of age and continues through 51 years of age.  Your health care provider may recommend screening at an earlier age if you have risk factors for colon cancer.  Your health care provider may also recommend using home test kits to check for hidden blood in the stool.  A small camera at the end of  a tube can be used to examine your colon directly (sigmoidoscopy or colonoscopy). This is done to check for the earliest forms of colorectal cancer.  Routine screening usually begins at age 50.  Direct examination of the colon should be repeated every 5-10 years through 51 years of age. However, you may need to be screened more often if early forms of precancerous polyps or small growths are found. Skin Cancer  Check your skin from head to toe regularly.  Tell your health care provider about any new moles or changes in moles, especially if there is a change in a mole's shape or color.  Also tell your health care provider if you have a mole that is larger than the size of a pencil eraser.  Always use sunscreen. Apply sunscreen liberally and repeatedly throughout the day.  Protect yourself by wearing long sleeves, pants, a wide-brimmed hat, and sunglasses whenever you are outside. HEART DISEASE, DIABETES, AND HIGH BLOOD PRESSURE     High blood pressure causes heart disease and increases the risk of stroke. High blood pressure is more likely to develop in:  People who have blood pressure in the high end of the normal range (130-139/85-89 mm Hg).  People who are overweight or obese.  People who are African American.  If you are 18-39 years of age, have your blood pressure checked every 3-5 years. If you are 40 years of age or older, have your blood pressure checked every year. You should have your blood pressure measured twice--once when you are at a hospital or clinic, and once when you are not at a hospital or clinic. Record the average of the two measurements. To check your blood pressure when you are not at a hospital or clinic, you can use:  An automated blood pressure machine at a pharmacy.  A home blood pressure monitor.  If you are between 55 years and 79 years old, ask your health care provider if you should take aspirin to prevent strokes.  Have regular diabetes screenings. This  involves taking a blood sample to check your fasting blood sugar level.  If you are at a normal weight and have a low risk for diabetes, have this test once every three years after 51 years of age.  If you are overweight and have a high risk for diabetes, consider being tested at a younger age or more often. PREVENTING INFECTION  Hepatitis B  If you have a higher risk for hepatitis B, you should be screened for this virus. You are considered at high risk for hepatitis B if:  You were born in a country where hepatitis B is common. Ask your health care provider which countries are considered high risk.  Your parents were born in a high-risk country, and you have not been immunized against hepatitis B (hepatitis B vaccine).  You have HIV or AIDS.  You use needles to inject street drugs.  You live with someone who has hepatitis B.  You have had sex with someone who has hepatitis B.  You get hemodialysis treatment.  You take certain medicines for conditions, including cancer, organ transplantation, and autoimmune conditions. Hepatitis C  Blood testing is recommended for:  Everyone born from 1945 through 1965.  Anyone with known risk factors for hepatitis C. Sexually transmitted infections (STIs)  You should be screened for sexually transmitted infections (STIs) including gonorrhea and chlamydia if:  You are sexually active and are younger than 51 years of age.  You are older than 51 years of age and your health care provider tells you that you are at risk for this type of infection.  Your sexual activity has changed since you were last screened and you are at an increased risk for chlamydia or gonorrhea. Ask your health care provider if you are at risk.  If you do not have HIV, but are at risk, it may be recommended that you take a prescription medicine daily to prevent HIV infection. This is called pre-exposure prophylaxis (PrEP). You are considered at risk if:  You are  sexually active and do not regularly use condoms or know the HIV status of your partner(s).  You take drugs by injection.  You are sexually active with a partner who has HIV. Talk with your health care provider about whether you are at high risk of being infected with HIV. If you choose to begin PrEP, you should first be tested for HIV. You should then be tested every 3 months for as   long as you are taking PrEP.  PREGNANCY   If you are premenopausal and you may become pregnant, ask your health care provider about preconception counseling.  If you may become pregnant, take 400 to 800 micrograms (mcg) of folic acid every day.  If you want to prevent pregnancy, talk to your health care provider about birth control (contraception). OSTEOPOROSIS AND MENOPAUSE   Osteoporosis is a disease in which the bones lose minerals and strength with aging. This can result in serious bone fractures. Your risk for osteoporosis can be identified using a bone density scan.  If you are 27 years of age or older, or if you are at risk for osteoporosis and fractures, ask your health care provider if you should be screened.  Ask your health care provider whether you should take a calcium or vitamin D supplement to lower your risk for osteoporosis.  Menopause may have certain physical symptoms and risks.  Hormone replacement therapy may reduce some of these symptoms and risks. Talk to your health care provider about whether hormone replacement therapy is right for you.  HOME CARE INSTRUCTIONS   Schedule regular health, dental, and eye exams.  Stay current with your immunizations.   Do not use any tobacco products including cigarettes, chewing tobacco, or electronic cigarettes.  If you are pregnant, do not drink alcohol.  If you are breastfeeding, limit how much and how often you drink alcohol.  Limit alcohol intake to no more than 1 drink per day for nonpregnant women. One drink equals 12 ounces of beer, 5  ounces of wine, or 1 ounces of hard liquor.  Do not use street drugs.  Do not share needles.  Ask your health care provider for help if you need support or information about quitting drugs.  Tell your health care provider if you often feel depressed.  Tell your health care provider if you have ever been abused or do not feel safe at home.   This information is not intended to replace advice given to you by your health care provider. Make sure you discuss any questions you have with your health care provider.   Document Released: 04/20/2011 Document Revised: 10/26/2014 Document Reviewed: 09/06/2013 Elsevier Interactive Patient Education [employer].

## 2016-08-07 NOTE — Assessment & Plan Note (Signed)
Levels have normalized with daily supplementation

## 2016-08-07 NOTE — Progress Notes (Addendum)
BP (!) 142/94 (BP Location: Right Arm, Cuff Size: Large)   Pulse 88   Temp 98.4 F (36.9 C) (Oral)   Ht 4' 9.5" (1.461 m)   Wt 175 lb (79.4 kg)   BMI 37.21 kg/m    CC: CPE Subjective:    Patient ID: Melanie Holloway, female    DOB: 01-Feb-1966, 50 y.o.   MRN: [id number]  HPI: Melanie Holloway is a 50 y.o. female presenting on 08/07/2016 for Annual Exam   Has not recently seen nephrologist.  Fall 5d ago off curb. Injured R lateral foot - ongoing pain and swelling as well.   Prevenative: Well woman exam with OBGYN - with Dr. Kennon Rounds 2016. Normal pap smear.  LMP 2009 - chemo led to early menopause Breast cancer - 8 yrs free. Recent mammogram 06/2016 WNL. Graduated from onc f/u.  Flu shot - last month Tdap 2016 Seat belt use discussed Sunscreen use discussed. No changing moles on skin.  Non smoker Alcohol - none  Caffeine: occasional coffee and soda  Lives with husband and 21 yo child  Occupation: business Surveyor, [employer]  Activity: walks regularly about 20-30 min/day  Diet: good water, fruits/vegetables daily, red meat 3-4x/wk, fish occasional  Relevant past medical, surgical, family and social history reviewed and updated as indicated. Interim medical history since our last visit reviewed. Allergies and medications reviewed and updated. Current Outpatient Prescriptions on File Prior to Visit  Medication Sig  . Cholecalciferol (VITAMIN D) 2000 UNITS CAPS Take 1 capsule (2,000 Units total) by mouth daily.  Marland Kitchen losartan-hydrochlorothiazide (HYZAAR) 50-12.5 MG tablet Take 1 tablet by mouth daily.   No current facility-administered medications on file prior to visit.     Review of Systems  Constitutional: Negative for activity change, appetite change, chills, fatigue, fever and unexpected weight change.  HENT: Negative for hearing loss.   Eyes: Negative for visual disturbance.  Respiratory: Negative for cough, chest tightness, shortness of breath and wheezing.   Cardiovascular:  Negative for chest pain, palpitations and leg swelling.  Gastrointestinal: Negative for abdominal distention, abdominal pain, blood in stool, constipation, diarrhea, nausea and vomiting.  Genitourinary: Negative for difficulty urinating and hematuria.  Musculoskeletal: Negative for arthralgias, myalgias and neck pain.  Skin: Negative for rash.  Neurological: Negative for dizziness, seizures, syncope and headaches.  Hematological: Negative for adenopathy. Does not bruise/bleed easily.  Psychiatric/Behavioral: Negative for dysphoric mood. The patient is not nervous/anxious.    Per HPI unless specifically indicated in ROS section     Objective:    BP (!) 142/94 (BP Location: Right Arm, Cuff Size: Large)   Pulse 88   Temp 98.4 F (36.9 C) (Oral)   Ht 4' 9.5" (1.461 m)   Wt 175 lb (79.4 kg)   BMI 37.21 kg/m   Wt Readings from Last 3 Encounters:  08/07/16 175 lb (79.4 kg)  07/16/16 175 lb 4.8 oz (79.5 kg)  05/14/16 178 lb 8 oz (81 kg)    Physical Exam  Constitutional: She is oriented to person, place, and time. She appears well-developed and well-nourished. No distress.  HENT:  Head: Normocephalic and atraumatic.  Right Ear: Hearing, tympanic membrane, external ear and ear canal normal.  Left Ear: Hearing, tympanic membrane, external ear and ear canal normal.  Nose: Nose normal.  Mouth/Throat: Uvula is midline, oropharynx is clear and moist and mucous membranes are normal. No oropharyngeal exudate, posterior oropharyngeal edema or posterior oropharyngeal erythema.  Eyes: Conjunctivae and EOM are normal. Pupils are equal, round, and reactive to  light. No scleral icterus.  Neck: Normal range of motion. Neck supple. No thyromegaly present.  Cardiovascular: Normal rate, regular rhythm, normal heart sounds and intact distal pulses.   No murmur heard. Pulses:      Radial pulses are 2+ on the right side, and 2+ on the left side.  Pulmonary/Chest: Effort normal and breath sounds normal. No  respiratory distress. She has no wheezes. She has no rales.  Abdominal: Soft. Bowel sounds are normal. She exhibits no distension and no mass. There is no tenderness. There is no rebound and no guarding.  Musculoskeletal: Normal range of motion. She exhibits no edema.  R lateral foot point tender to palpation at 5th MT shaft, none at base of 5th MT 2+ DP bilaterally  Lymphadenopathy:    She has no cervical adenopathy.  Neurological: She is alert and oriented to person, place, and time.  CN grossly intact, station and gait intact Sensation intact to light touch  Skin: Skin is warm and dry. No rash noted.  Psychiatric: She has a normal mood and affect. Her behavior is normal. Judgment and thought content normal.  Nursing note and vitals reviewed.  Results for orders placed or performed in visit on 07/28/16  Lipid panel  Result Value Ref Range   Cholesterol 150 0 - 200 mg/dL   Triglycerides 187.0 (H) 0.0 - 149.0 mg/dL   HDL 25.80 (L) >39.00 mg/dL   VLDL 37.4 0.0 - 40.0 mg/dL   LDL Cholesterol 87 0 - 99 mg/dL   Total CHOL/HDL Ratio 6    NonHDL 124.26   VITAMIN D 25 Hydroxy (Vit-D Deficiency, Fractures)  Result Value Ref Range   VITD 46.01 30.00 - 100.00 ng/mL      Assessment & Plan:   Problem List Items Addressed This Visit    Beta thalassemia (HCC)   Breast cancer of lower-outer quadrant of left female breast ([place])    Has graduated from onc surveillance. Needs yearly mammogram/breast exam.       Closed nondisplaced fracture of fifth metatarsal bone of right foot    Anticipate bony bruise - check films to r/o 5th MT shaft fracture - films with evidence of small nondisplaced fracture - will place in post op shoe x 4 wks. Discussed rest, ice, elevation of foot. Update if not improving with treatment.       Essential hypertension    Chronic, slightly elevated in office and at home.  Increase clonidine to next higher strength.       Relevant Medications   cloNIDine (CATAPRES -  DOSED IN MG/24 HR) 0.2 mg/24hr patch   Health maintenance examination - Primary    Preventative protocols reviewed and updated unless pt declined. Discussed healthy diet and lifestyle.       Obesity (BMI 35.0-39.9 without comorbidity)    Discussed healthy diet and lifestyle changes to affect sustainable weight loss. Continue regular walking routine      Proteinuria    Has not recently seen [name]      Vitamin D deficiency    Levels have normalized with daily supplementation       Other Visit Diagnoses   None.      Follow up plan: Return in about 6 months (around 02/05/2017) for follow up visit.  Ria Bush, MD

## 2016-11-26 ENCOUNTER — Other Ambulatory Visit: Payer: Self-pay | Admitting: Oncology

## 2016-12-13 ENCOUNTER — Other Ambulatory Visit: Payer: Self-pay | Admitting: Family Medicine

## 2016-12-17 ENCOUNTER — Telehealth: Payer: Self-pay | Admitting: Emergency Medicine

## 2016-12-17 NOTE — Telephone Encounter (Signed)
Patient called and left voice mail requesting that she talk to someone in regards to the letrozole she was taking.  Returned patient's call and received her voice mail. Left her a message to call back at her convenience.

## 2016-12-18 ENCOUNTER — Telehealth: Payer: Self-pay | Admitting: *Deleted

## 2016-12-18 NOTE — Telephone Encounter (Signed)
No New Note.

## 2016-12-18 NOTE — Telephone Encounter (Signed)
"  Dr. Jana Hakim told me to stop taking letrozole in September.  I had some left and kept taking them.  Everyone says it's preventive measures.  If [facility] [place] like to continue this.  If not, that's okay is well.  Pharmacy told me to call the office is why I'm calling."

## 2016-12-22 ENCOUNTER — Other Ambulatory Visit: Payer: Self-pay | Admitting: Oncology

## 2016-12-22 DIAGNOSIS — Z79811 Long term (current) use of aromatase inhibitors: Secondary | ICD-10-CM

## 2016-12-22 DIAGNOSIS — E2839 Other primary ovarian failure: Secondary | ICD-10-CM

## 2016-12-22 DIAGNOSIS — Z780 Asymptomatic menopausal state: Secondary | ICD-10-CM

## 2016-12-22 NOTE — Telephone Encounter (Signed)
This RN returned call to pt and informed her refill will be sent to pharmacy per MD dictation stating benefit of continuing on medication.  Last bone density was 2013 with start of AI therapy.  Informed pt she will be contacted for yearly visit ( last visit 04/2016 ) with MD as well as bone density for appropriate follow up.

## 2017-03-08 DIAGNOSIS — C50912 Malignant neoplasm of unspecified site of left female breast: Secondary | ICD-10-CM | POA: Diagnosis not present

## 2017-03-29 ENCOUNTER — Ambulatory Visit (INDEPENDENT_AMBULATORY_CARE_PROVIDER_SITE_OTHER): Payer: 59 | Admitting: Family Medicine

## 2017-03-29 ENCOUNTER — Encounter: Payer: Self-pay | Admitting: Family Medicine

## 2017-03-29 VITALS — BP 112/76 | HR 97 | Temp 98.5°F | Ht <= 58 in | Wt 165.5 lb

## 2017-03-29 DIAGNOSIS — D561 Beta thalassemia: Secondary | ICD-10-CM

## 2017-03-29 DIAGNOSIS — R21 Rash and other nonspecific skin eruption: Secondary | ICD-10-CM

## 2017-03-29 DIAGNOSIS — I10 Essential (primary) hypertension: Secondary | ICD-10-CM | POA: Diagnosis not present

## 2017-03-29 MED ORDER — CLOBETASOL PROPIONATE 0.05 % EX CREA: 1.0000 "application " | TOPICAL_CREAM | Freq: Two times a day (BID) | CUTANEOUS | 0 refills | Status: DC

## 2017-03-29 MED ORDER — TRIAMCINOLONE ACETONIDE 0.1 % EX CREA: 1.0000 "application " | TOPICAL_CREAM | Freq: Two times a day (BID) | CUTANEOUS | 0 refills | Status: AC

## 2017-03-29 NOTE — Assessment & Plan Note (Signed)
Anticipate heat rash and eczema - treat with triamcinolone BID regularly x 2 wks, discussed moisturizing regularly and avoiding hot showers.  Update with effect.

## 2017-03-29 NOTE — Progress Notes (Signed)
BP 112/76   Pulse 97   Temp 98.5 F (36.9 C) (Oral)   Ht 4' 9.5" (1.461 m)   Wt 165 lb 8 oz (75.1 kg)   SpO2 98%   BMI 35.19 kg/m    CC: rash Subjective:    Patient ID: Melanie Holloway, female    DOB: [birth year], 51 y.o.   MRN: [id number]  HPI: Melanie Holloway is a 50 y.o. female presenting on 03/29/2017 for Rash (bilateral arms, left leg x 2 weeks, itchy); Fatigue; and Insect Bite (mosquito)   1 wk ago while standing over the sink she felt lightheaded, laying down resolved this.   Noticed itchy rash on upper extremities over last 3 weeks. L lateral lower leg dry skin rash present for the past month.  No new lotions, detergents, soaps or shampoos. No new medicines.   Prior rash evaluated 02/2015 by dermatology - ?eczema.  Prior rash not improved despite diflucan 4 wk course.  She has lost 10 lbs in the last 6 months. bp at home running 140/80s.   Relevant past medical, surgical, family and social history reviewed and updated as indicated. Interim medical history since our last visit reviewed. Allergies and medications reviewed and updated. Outpatient Medications Prior to Visit  Medication Sig Dispense Refill  . Cholecalciferol (VITAMIN D) 2000 UNITS CAPS Take 1 capsule (2,000 Units total) by mouth daily. 30 capsule   . cloNIDine (CATAPRES - DOSED IN MG/24 HR) 0.2 mg/24hr patch Place 1 patch (0.2 mg total) onto the skin once a week. 12 patch 3  . letrozole (FEMARA) 2.5 MG tablet TAKE 1 TABLET (2.5 MG TOTAL) BY MOUTH DAILY. 90 tablet 1  . losartan-hydrochlorothiazide (HYZAAR) 50-12.5 MG tablet TAKE 1 TABLET BY MOUTH DAILY. 30 tablet 6   No facility-administered medications prior to visit.      Per HPI unless specifically indicated in ROS section below Review of Systems     Objective:    BP 112/76   Pulse 97   Temp 98.5 F (36.9 C) (Oral)   Ht 4' 9.5" (1.461 m)   Wt 165 lb 8 oz (75.1 kg)   SpO2 98%   BMI 35.19 kg/m   Wt Readings from Last 3 Encounters:  03/29/17 165 lb  8 oz (75.1 kg)  08/07/16 175 lb (79.4 kg)  07/16/16 175 lb 4.8 oz (79.5 kg)    Physical Exam  Constitutional: She appears well-developed and well-nourished. No distress.  HENT:  Mouth/Throat: Oropharynx is clear and moist. No oropharyngeal exudate.  Musculoskeletal: She exhibits no edema.  Skin: Skin is warm and dry. Rash noted. No erythema.  Pruritic papular rash bilateral forearms Pruritic patches of thickened scaly skin with excoriations bilateral dorsal forearms just distal to elbows Dry thickened skin patch present left lateral lower leg Dry skin L anterior neck and axillary regions - pt endorses residual from radiation  Nursing note and vitals reviewed.  Results for orders placed or performed in visit on 07/28/16  Lipid panel  Result Value Ref Range   Cholesterol 150 0 - 200 mg/dL   Triglycerides 187.0 (H) 0.0 - 149.0 mg/dL   HDL 25.80 (L) >39.00 mg/dL   VLDL 37.4 0.0 - 40.0 mg/dL   LDL Cholesterol 87 0 - 99 mg/dL   Total CHOL/HDL Ratio 6    NonHDL 124.26   VITAMIN D 25 Hydroxy (Vit-D Deficiency, Fractures)  Result Value Ref Range   VITD 46.01 30.00 - 100.00 ng/mL      Assessment & Plan:  Problem List Items Addressed This Visit    Beta thalassemia ([place])   Relevant Orders   CBC with Differential/Platelet   Essential hypertension    BP better controlled today - anticipate dizzy episode from hypotension. Advised pt continue to monitor bp at home and update if recurrent dizziness. Reviewed importance of hydration status.       Relevant Orders   Renal function panel   Microalbumin / creatinine urine ratio   Skin rash - Primary    Anticipate heat rash and eczema - treat with triamcinolone BID regularly x 2 wks, discussed moisturizing regularly and avoiding hot showers.  Update with effect.           Follow up plan: Return if symptoms worsen or fail to improve.  Ria Bush, MD

## 2017-03-29 NOTE — Patient Instructions (Addendum)
I think this is eczema combined with heat rash. Treat with triamcinolone cream twice daily for 2 weeks then stop. Continue regular moisturizing cream throughout body (aveeno or eucerin).  For rough patch on left leg - use stronger steroid cream (clobetasol).  Avoid too hot showers, fragranced lotions or soaps. Let us know if not improving with treatment.

## 2017-03-29 NOTE — Assessment & Plan Note (Signed)
BP better controlled today - anticipate dizzy episode from hypotension. Advised pt continue to monitor bp at home and update if recurrent dizziness. Reviewed importance of hydration status.

## 2017-03-30 ENCOUNTER — Other Ambulatory Visit: Payer: Self-pay | Admitting: Family Medicine

## 2017-03-30 LAB — MICROALBUMIN / CREATININE URINE RATIO
Creatinine,U: 77.3 mg/dL
Microalb Creat Ratio: 21.6 mg/g (ref 0.0–30.0)
Microalb, Ur: 16.7 mg/dL — ABNORMAL HIGH (ref 0.0–1.9)

## 2017-03-30 LAB — RENAL FUNCTION PANEL
ALBUMIN: 3.3 g/dL — AB (ref 3.5–5.2)
BUN: 32 mg/dL — ABNORMAL HIGH (ref 6–23)
CO2: 27 mEq/L (ref 19–32)
Calcium: 9.3 mg/dL (ref 8.4–10.5)
Chloride: 104 mEq/L (ref 96–112)
Creatinine, Ser: 1.58 mg/dL — ABNORMAL HIGH (ref 0.40–1.20)
GFR: 44.1 mL/min — AB (ref >60.00)
Glucose, Bld: 88 mg/dL (ref 70–99)
POTASSIUM: 3.7 meq/L (ref 3.5–5.1)
Phosphorus: 4.4 mg/dL (ref 2.3–4.6)
SODIUM: 136 meq/L (ref 135–145)

## 2017-03-30 LAB — CBC WITH DIFFERENTIAL/PLATELET
BASOS PCT: 1.3 % (ref 0.0–3.0)
Basophils Absolute: 0 10*3/uL (ref 0.0–0.1)
EOS PCT: 0.6 % (ref 0.0–5.0)
Eosinophils Absolute: 0 10*3/uL (ref 0.0–0.7)
HEMOGLOBIN: 9 g/dL — AB (ref 12.0–15.0)
Lymphocytes Relative: 31 % (ref 12.0–46.0)
Lymphs Abs: 1.1 10*3/uL (ref 0.7–4.0)
MCHC: 35.1 g/dL (ref 30.0–36.0)
MCV: 86.1 fl (ref 78.0–100.0)
MONOS PCT: 11.4 % (ref 3.0–12.0)
Monocytes Absolute: 0.4 10*3/uL (ref 0.1–1.0)
Neutro Abs: 1.9 10*3/uL (ref 1.4–7.7)
Neutrophils Relative %: 55.7 % (ref 43.0–77.0)
Platelets: 198 10*3/uL (ref 150.0–400.0)
RBC: 2.97 Mil/uL — AB (ref 3.87–5.11)
RDW: 13.6 % (ref 11.5–15.5)
WBC: 3.5 10*3/uL — AB (ref 4.0–10.5)

## 2017-03-30 NOTE — Telephone Encounter (Signed)
Pt seen yesterday. I Rx triamcinolone and clobetasol creams for rash.  Is requested fluocinonide (lidex) ointment cheaper than clobetasol? Otherwise would recommend Rx sent in yesterday.

## 2017-03-30 NOTE — Telephone Encounter (Signed)
Patient picked up triamcinolone cream but the Clobetasol Cream was over $100 and she could not afford it.  Could it be replaced with the Lidex?

## 2017-03-30 NOTE — Telephone Encounter (Signed)
Electronic refill request. Last office visit:   03/29/17 This medication is not currently on patient's meds list.  Please advise.

## 2017-03-30 NOTE — Telephone Encounter (Signed)
Ok to do - have sent in.

## 2017-04-07 DIAGNOSIS — N179 Acute kidney failure, unspecified: Secondary | ICD-10-CM | POA: Diagnosis not present

## 2017-04-07 DIAGNOSIS — N183 Chronic kidney disease, stage 3 (moderate): Secondary | ICD-10-CM | POA: Diagnosis not present

## 2017-04-07 DIAGNOSIS — R809 Proteinuria, unspecified: Secondary | ICD-10-CM | POA: Diagnosis not present

## 2017-04-14 DIAGNOSIS — R319 Hematuria, unspecified: Secondary | ICD-10-CM | POA: Diagnosis not present

## 2017-04-14 DIAGNOSIS — R7989 Other specified abnormal findings of blood chemistry: Secondary | ICD-10-CM | POA: Diagnosis not present

## 2017-04-14 DIAGNOSIS — R809 Proteinuria, unspecified: Secondary | ICD-10-CM | POA: Diagnosis not present

## 2017-04-26 ENCOUNTER — Telehealth: Payer: Self-pay

## 2017-04-26 DIAGNOSIS — R21 Rash and other nonspecific skin eruption: Secondary | ICD-10-CM

## 2017-04-26 NOTE — Telephone Encounter (Signed)
Pt was seen 03/29/17 and pt used the triamcinolone cream without any relief; rash has started to spread and is now on both arms entirely up to shoulders. Pt request cb. Pt did notice this morning when came in from the garden that rash had gotten worse. Pt request cb. [facility] [place].

## 2017-04-27 DIAGNOSIS — N179 Acute kidney failure, unspecified: Secondary | ICD-10-CM | POA: Diagnosis not present

## 2017-04-27 DIAGNOSIS — R809 Proteinuria, unspecified: Secondary | ICD-10-CM | POA: Diagnosis not present

## 2017-04-27 DIAGNOSIS — N183 Chronic kidney disease, stage 3 (moderate): Secondary | ICD-10-CM | POA: Diagnosis not present

## 2017-04-27 NOTE — Telephone Encounter (Signed)
As rash worsening, would offer derm referral. Referral placed.

## 2017-04-27 NOTE — Telephone Encounter (Signed)
Referral faxed to [facility], patient aware that they will call her to set up. She will call us back with Appt info.

## 2017-04-27 NOTE — Telephone Encounter (Signed)
Patient notified of dermatology referral.  Patient went to a dermatologist in [place], she can't remember the name of the doctor, but it was on [facility].  It's been a couple of years since she was seen there.  Patient would like to go to that office and she prefers early morning on any day.

## 2017-04-27 NOTE — Telephone Encounter (Signed)
Left message on voicemail for patient to call back. 

## 2017-05-17 DIAGNOSIS — R809 Proteinuria, unspecified: Secondary | ICD-10-CM | POA: Diagnosis not present

## 2017-05-17 DIAGNOSIS — I10 Essential (primary) hypertension: Secondary | ICD-10-CM | POA: Diagnosis not present

## 2017-05-17 DIAGNOSIS — R319 Hematuria, unspecified: Secondary | ICD-10-CM | POA: Diagnosis not present

## 2017-05-17 DIAGNOSIS — D649 Anemia, unspecified: Secondary | ICD-10-CM | POA: Diagnosis not present

## 2017-05-20 ENCOUNTER — Other Ambulatory Visit: Payer: Self-pay | Admitting: Oncology

## 2017-05-21 ENCOUNTER — Telehealth: Payer: Self-pay | Admitting: *Deleted

## 2017-05-21 NOTE — Telephone Encounter (Signed)
Called patient per orders.  "I have talked to Dr. Virgie Dad Nurse Practitioner.  Expecting a call from scheduler with an early morning appointment."    Telephone documentation closed at this time.

## 2017-05-21 NOTE — Telephone Encounter (Signed)
Please call patient and figure out when she can come in for appt, and send LOS.  Thanks!

## 2017-05-21 NOTE — Telephone Encounter (Signed)
Voicemail received 'I feel a lump in my breast.  They said I need a diagnostic mammogram,  Call me [phone]."  Message left requesting return call.  Awaiting return call from patient.  Graduated 07-16-2016. Eight years out from definitive surgery

## 2017-05-24 ENCOUNTER — Telehealth: Payer: Self-pay | Admitting: Oncology

## 2017-05-24 NOTE — Telephone Encounter (Signed)
Left message re 8/8 f/u

## 2017-05-26 ENCOUNTER — Ambulatory Visit (HOSPITAL_BASED_OUTPATIENT_CLINIC_OR_DEPARTMENT_OTHER): Payer: 59 | Admitting: Oncology

## 2017-05-26 VITALS — BP 148/82 | HR 102 | Temp 97.9°F | Resp 18 | Ht <= 58 in | Wt 162.2 lb

## 2017-05-26 DIAGNOSIS — N6311 Unspecified lump in the right breast, upper outer quadrant: Secondary | ICD-10-CM

## 2017-05-26 DIAGNOSIS — E2839 Other primary ovarian failure: Secondary | ICD-10-CM

## 2017-05-26 DIAGNOSIS — D649 Anemia, unspecified: Secondary | ICD-10-CM | POA: Diagnosis not present

## 2017-05-26 DIAGNOSIS — Z780 Asymptomatic menopausal state: Secondary | ICD-10-CM

## 2017-05-26 DIAGNOSIS — C50512 Malignant neoplasm of lower-outer quadrant of left female breast: Secondary | ICD-10-CM

## 2017-05-26 DIAGNOSIS — Z170 Estrogen receptor positive status [ER+]: Secondary | ICD-10-CM | POA: Diagnosis not present

## 2017-05-26 DIAGNOSIS — Z79811 Long term (current) use of aromatase inhibitors: Secondary | ICD-10-CM

## 2017-05-26 MED ORDER — LETROZOLE 2.5 MG PO TABS: 2.5000 mg | ORAL_TABLET | Freq: Every day | ORAL | 1 refills | Status: DC
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
## 2017-06-01 ENCOUNTER — Ambulatory Visit
Admission: RE | Admit: 2017-06-01 | Discharge: 2017-06-01 | Disposition: A | Discharge status: Home / Self Care | Payer: 59 | Source: Ambulatory Visit | Attending: Oncology | Admitting: Oncology

## 2017-06-01 ENCOUNTER — Other Ambulatory Visit: Payer: Self-pay | Admitting: Oncology

## 2017-06-01 DIAGNOSIS — C50512 Malignant neoplasm of lower-outer quadrant of left female breast: Secondary | ICD-10-CM

## 2017-06-01 DIAGNOSIS — C50912 Malignant neoplasm of unspecified site of left female breast: Secondary | ICD-10-CM | POA: Diagnosis not present

## 2017-06-01 DIAGNOSIS — Z170 Estrogen receptor positive status [ER+]: Principal | ICD-10-CM

## 2017-06-01 DIAGNOSIS — R928 Other abnormal and inconclusive findings on diagnostic imaging of breast: Secondary | ICD-10-CM | POA: Diagnosis not present

## 2017-06-01 DIAGNOSIS — R599 Enlarged lymph nodes, unspecified: Secondary | ICD-10-CM

## 2017-06-01 DIAGNOSIS — N6311 Unspecified lump in the right breast, upper outer quadrant: Secondary | ICD-10-CM | POA: Diagnosis not present

## 2017-06-03 ENCOUNTER — Other Ambulatory Visit: Payer: Self-pay | Admitting: Oncology

## 2017-06-03 DIAGNOSIS — R599 Enlarged lymph nodes, unspecified: Secondary | ICD-10-CM

## 2017-06-04 ENCOUNTER — Ambulatory Visit
Admission: RE | Admit: 2017-06-04 | Discharge: 2017-06-04 | Disposition: A | Discharge status: Home / Self Care | Payer: 59 | Source: Ambulatory Visit | Attending: Oncology | Admitting: Oncology

## 2017-06-04 DIAGNOSIS — R590 Localized enlarged lymph nodes: Secondary | ICD-10-CM | POA: Diagnosis not present

## 2017-06-04 DIAGNOSIS — R599 Enlarged lymph nodes, unspecified: Secondary | ICD-10-CM

## 2017-06-06 ENCOUNTER — Other Ambulatory Visit: Payer: Self-pay | Admitting: Family Medicine

## 2017-06-08 ENCOUNTER — Ambulatory Visit (HOSPITAL_BASED_OUTPATIENT_CLINIC_OR_DEPARTMENT_OTHER): Payer: 59 | Admitting: Oncology

## 2017-06-08 VITALS — BP 118/75 | HR 102 | Temp 98.9°F | Resp 18 | Ht <= 58 in | Wt 163.2 lb

## 2017-06-08 DIAGNOSIS — Z170 Estrogen receptor positive status [ER+]: Secondary | ICD-10-CM

## 2017-06-08 DIAGNOSIS — D649 Anemia, unspecified: Secondary | ICD-10-CM

## 2017-06-08 DIAGNOSIS — N62 Hypertrophy of breast: Secondary | ICD-10-CM

## 2017-06-08 DIAGNOSIS — C50512 Malignant neoplasm of lower-outer quadrant of left female breast: Secondary | ICD-10-CM
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
## 2017-06-17 DIAGNOSIS — L309 Dermatitis, unspecified: Secondary | ICD-10-CM | POA: Diagnosis not present

## 2017-06-17 DIAGNOSIS — L932 Other local lupus erythematosus: Secondary | ICD-10-CM | POA: Diagnosis not present

## 2017-06-28 DIAGNOSIS — L931 Subacute cutaneous lupus erythematosus: Secondary | ICD-10-CM | POA: Diagnosis not present

## 2017-06-28 DIAGNOSIS — L932 Other local lupus erythematosus: Secondary | ICD-10-CM | POA: Diagnosis not present

## 2017-07-02 ENCOUNTER — Other Ambulatory Visit: Payer: Self-pay | Admitting: Family Medicine

## 2017-07-03 ENCOUNTER — Encounter: Payer: Self-pay | Admitting: Family Medicine

## 2017-08-03 ENCOUNTER — Other Ambulatory Visit: Payer: Self-pay | Admitting: Family Medicine

## 2017-08-03 ENCOUNTER — Encounter: Payer: Self-pay | Admitting: Genetics

## 2017-08-26 ENCOUNTER — Other Ambulatory Visit: Payer: Self-pay | Admitting: Family Medicine

## 2017-09-02 ENCOUNTER — Other Ambulatory Visit: Payer: Self-pay | Admitting: Family Medicine
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
## 2017-09-08 ENCOUNTER — Telehealth: Payer: Self-pay | Admitting: Oncology

## 2017-09-08 ENCOUNTER — Other Ambulatory Visit (HOSPITAL_BASED_OUTPATIENT_CLINIC_OR_DEPARTMENT_OTHER): Payer: 59

## 2017-09-08 ENCOUNTER — Ambulatory Visit (HOSPITAL_BASED_OUTPATIENT_CLINIC_OR_DEPARTMENT_OTHER): Payer: 59 | Admitting: Oncology

## 2017-09-08 VITALS — BP 155/94 | HR 101 | Temp 98.2°F | Resp 20 | Ht <= 58 in | Wt 152.2 lb

## 2017-09-08 DIAGNOSIS — D649 Anemia, unspecified: Secondary | ICD-10-CM

## 2017-09-08 DIAGNOSIS — Z853 Personal history of malignant neoplasm of breast: Secondary | ICD-10-CM

## 2017-09-08 DIAGNOSIS — R634 Abnormal weight loss: Secondary | ICD-10-CM

## 2017-09-08 DIAGNOSIS — Z170 Estrogen receptor positive status [ER+]: Principal | ICD-10-CM

## 2017-09-08 DIAGNOSIS — L932 Other local lupus erythematosus: Secondary | ICD-10-CM

## 2017-09-08 DIAGNOSIS — D72819 Decreased white blood cell count, unspecified: Secondary | ICD-10-CM

## 2017-09-08 DIAGNOSIS — D696 Thrombocytopenia, unspecified: Secondary | ICD-10-CM

## 2017-09-08 DIAGNOSIS — C50512 Malignant neoplasm of lower-outer quadrant of left female breast: Secondary | ICD-10-CM | POA: Diagnosis not present

## 2017-09-08 LAB — COMPREHENSIVE METABOLIC PANEL
ALBUMIN: 2.6 g/dL — AB (ref 3.5–5.0)
ALK PHOS: 78 U/L (ref 40–150)
ALT: 8 U/L (ref 0–55)
AST: 19 U/L (ref 5–34)
Anion Gap: 7 mEq/L (ref 3–11)
BILIRUBIN TOTAL: 0.37 mg/dL (ref 0.20–1.20)
BUN: 38 mg/dL — ABNORMAL HIGH (ref 7.0–26.0)
CALCIUM: 8.6 mg/dL (ref 8.4–10.4)
CO2: 21 mEq/L — ABNORMAL LOW (ref 22–29)
Chloride: 107 mEq/L (ref 98–109)
Creatinine: 1.4 mg/dL — ABNORMAL HIGH (ref 0.6–1.1)
EGFR: 52 mL/min/{1.73_m2} — ABNORMAL LOW (ref >60)
Glucose: 84 mg/dl (ref 70–140)
Potassium: 3.3 mEq/L — ABNORMAL LOW (ref 3.5–5.1)
SODIUM: 135 meq/L — AB (ref 136–145)
TOTAL PROTEIN: 8.2 g/dL (ref 6.4–8.3)

## 2017-09-08 LAB — CBC WITH DIFFERENTIAL/PLATELET
BASO%: 0.3 % (ref 0.0–2.0)
Basophils Absolute: 0 10*3/uL (ref 0.0–0.1)
EOS%: 0.6 % (ref 0.0–7.0)
Eosinophils Absolute: 0 10*3/uL (ref 0.0–0.5)
HCT: 22.7 % — ABNORMAL LOW (ref 34.8–46.6)
HGB: 7.8 g/dL — ABNORMAL LOW (ref 11.6–15.9)
LYMPH%: 34.4 % (ref 14.0–49.7)
MCH: 28.5 pg (ref 25.1–34.0)
MCHC: 34.4 g/dL (ref 31.5–36.0)
MCV: 82.9 fL (ref 79.5–101.0)
MONO#: 0.2 10*3/uL (ref 0.1–0.9)
MONO%: 7.9 % (ref 0.0–14.0)
NEUT%: 56.8 % (ref 38.4–76.8)
NEUTROS ABS: 1.5 10*3/uL (ref 1.5–6.5)
Platelets: 133 10*3/uL — ABNORMAL LOW (ref 145–400)
RBC: 2.73 10*6/uL — AB (ref 3.70–5.45)
RDW: 13.8 % (ref 11.2–14.5)
WBC: 2.7 10*3/uL — AB (ref 3.9–10.3)
lymph#: 0.9 10*3/uL (ref 0.9–3.3)

## 2017-09-08 LAB — RETICULOCYTES (CHCC)
Immature Retic Fract: 8.7 % (ref 1.60–10.00)
RBC: 2.79 10*6/uL — AB (ref 3.70–5.45)
RETIC %: 0.89 % (ref 0.70–2.10)
RETIC CT ABS: 24.83 10*3/uL — AB (ref 33.70–90.70)

## 2017-09-08 LAB — LACTATE DEHYDROGENASE: LDH: 210 U/L (ref 125–245)

## 2017-09-08 LAB — CHCC SMEAR

## 2017-09-08 NOTE — Telephone Encounter (Signed)
Scheduled appt per 11/21 los - Gave patient AVS and calender per los. Central radiology to contact patient with ct and bone scan appts.

## 2017-09-09 LAB — FOLATE: Folate: 7.5 ng/mL (ref >3.0)

## 2017-09-09 LAB — BETA 2 MICROGLOBULIN, SERUM: BETA 2: 9.1 mg/L — AB (ref 0.6–2.4)

## 2017-09-09 LAB — VITAMIN B12: Vitamin B12: 505 pg/mL (ref 232–1245)

## 2017-09-09 LAB — C-REACTIVE PROTEIN: CRP: 4 mg/L (ref 0.0–4.9)

## 2017-09-10 LAB — ANTINUCLEAR ANTIBODIES, IFA: ANTINUCLEAR ANTIBODIES, IFA: POSITIVE — AB

## 2017-09-10 LAB — FERRITIN: Ferritin: 521 ng/ml — ABNORMAL HIGH (ref 9–269)

## 2017-09-13 ENCOUNTER — Other Ambulatory Visit: Payer: Self-pay | Admitting: Oncology

## 2017-09-13 ENCOUNTER — Encounter: Payer: Self-pay | Admitting: Oncology

## 2017-09-13 DIAGNOSIS — D649 Anemia, unspecified: Secondary | ICD-10-CM

## 2017-09-13 DIAGNOSIS — C50512 Malignant neoplasm of lower-outer quadrant of left female breast: Secondary | ICD-10-CM

## 2017-09-13 DIAGNOSIS — R768 Other specified abnormal immunological findings in serum: Secondary | ICD-10-CM | POA: Insufficient documentation

## 2017-09-13 DIAGNOSIS — Z170 Estrogen receptor positive status [ER+]: Principal | ICD-10-CM

## 2017-09-13 NOTE — Progress Notes (Signed)
I called Melanie Holloway and gave her regarding her labs, which showed normal B12 folate and ferritin, but a very high ANA titer, and elevated C-reactive protein, and a high beta-2 microglobulin.  I think that we are dealing with a rheumatologic problem and have placed a referral to Dr. Corliss Blacker.  I have also set Elyse up for a CT of the chest just to make sure we are not dealing with something else.  She already has an appointment with me in January.  Hopefully by then we will have sorted this out

## 2017-09-21 ENCOUNTER — Emergency Department: Payer: 59

## 2017-09-21 ENCOUNTER — Ambulatory Visit: Payer: Self-pay

## 2017-09-21 ENCOUNTER — Observation Stay
Admission: EM | Admit: 2017-09-21 | Discharge: 2017-09-22 | Disposition: A | Discharge status: Home / Self Care | Payer: 59 | Attending: Internal Medicine | Admitting: Internal Medicine

## 2017-09-21 ENCOUNTER — Encounter: Payer: Self-pay | Admitting: Emergency Medicine

## 2017-09-21 ENCOUNTER — Other Ambulatory Visit: Payer: Self-pay

## 2017-09-21 DIAGNOSIS — Z170 Estrogen receptor positive status [ER+]: Secondary | ICD-10-CM | POA: Diagnosis not present

## 2017-09-21 DIAGNOSIS — I10 Essential (primary) hypertension: Secondary | ICD-10-CM | POA: Diagnosis not present

## 2017-09-21 DIAGNOSIS — M899 Disorder of bone, unspecified: Secondary | ICD-10-CM | POA: Diagnosis not present

## 2017-09-21 DIAGNOSIS — R591 Generalized enlarged lymph nodes: Secondary | ICD-10-CM | POA: Diagnosis present

## 2017-09-21 DIAGNOSIS — K5900 Constipation, unspecified: Secondary | ICD-10-CM | POA: Diagnosis not present

## 2017-09-21 DIAGNOSIS — E559 Vitamin D deficiency, unspecified: Secondary | ICD-10-CM | POA: Diagnosis not present

## 2017-09-21 DIAGNOSIS — K8020 Calculus of gallbladder without cholecystitis without obstruction: Secondary | ICD-10-CM | POA: Insufficient documentation

## 2017-09-21 DIAGNOSIS — R1012 Left upper quadrant pain: Secondary | ICD-10-CM | POA: Diagnosis not present

## 2017-09-21 DIAGNOSIS — E669 Obesity, unspecified: Secondary | ICD-10-CM | POA: Insufficient documentation

## 2017-09-21 DIAGNOSIS — D7389 Other diseases of spleen: Secondary | ICD-10-CM | POA: Diagnosis not present

## 2017-09-21 DIAGNOSIS — C50512 Malignant neoplasm of lower-outer quadrant of left female breast: Secondary | ICD-10-CM

## 2017-09-21 DIAGNOSIS — R1110 Vomiting, unspecified: Secondary | ICD-10-CM | POA: Diagnosis not present

## 2017-09-21 DIAGNOSIS — K529 Noninfective gastroenteritis and colitis, unspecified: Principal | ICD-10-CM | POA: Insufficient documentation

## 2017-09-21 DIAGNOSIS — Z79899 Other long term (current) drug therapy: Secondary | ICD-10-CM | POA: Insufficient documentation

## 2017-09-21 DIAGNOSIS — C50912 Malignant neoplasm of unspecified site of left female breast: Secondary | ICD-10-CM

## 2017-09-21 DIAGNOSIS — Z853 Personal history of malignant neoplasm of breast: Secondary | ICD-10-CM | POA: Diagnosis not present

## 2017-09-21 DIAGNOSIS — I313 Pericardial effusion (noninflammatory): Secondary | ICD-10-CM | POA: Diagnosis not present

## 2017-09-21 DIAGNOSIS — D739 Disease of spleen, unspecified: Secondary | ICD-10-CM

## 2017-09-21 LAB — URINALYSIS, COMPLETE (UACMP) WITH MICROSCOPIC
BACTERIA UA: NONE SEEN
BILIRUBIN URINE: NEGATIVE
GLUCOSE, UA: NEGATIVE mg/dL
KETONES UR: NEGATIVE mg/dL
LEUKOCYTES UA: NEGATIVE
NITRITE: NEGATIVE
PROTEIN: 100 mg/dL — AB
Specific Gravity, Urine: 1.02 (ref 1.005–1.030)
pH: 5 (ref 5.0–8.0)

## 2017-09-21 LAB — LIPASE, BLOOD: Lipase: 25 U/L (ref 11–51)

## 2017-09-21 LAB — COMPREHENSIVE METABOLIC PANEL
ALBUMIN: 2.8 g/dL — AB (ref 3.5–5.0)
ALT: 11 U/L — ABNORMAL LOW (ref 14–54)
ANION GAP: 11 (ref 5–15)
AST: 27 U/L (ref 15–41)
Alkaline Phosphatase: 80 U/L (ref 38–126)
BILIRUBIN TOTAL: 0.5 mg/dL (ref 0.3–1.2)
BUN: 34 mg/dL — AB (ref 6–20)
CHLORIDE: 104 mmol/L (ref 101–111)
CO2: 20 mmol/L — ABNORMAL LOW (ref 22–32)
Calcium: 8.8 mg/dL — ABNORMAL LOW (ref 8.9–10.3)
Creatinine, Ser: 1.3 mg/dL — ABNORMAL HIGH (ref 0.44–1.00)
GFR calc Af Amer: 54 mL/min — ABNORMAL LOW (ref >60)
GFR, EST NON AFRICAN AMERICAN: 46 mL/min — AB (ref >60)
GLUCOSE: 89 mg/dL (ref 65–99)
POTASSIUM: 3.5 mmol/L (ref 3.5–5.1)
Sodium: 135 mmol/L (ref 135–145)
TOTAL PROTEIN: 8.6 g/dL — AB (ref 6.5–8.1)

## 2017-09-21 LAB — CBC
HEMATOCRIT: 27.6 % — AB (ref 35.0–47.0)
HEMOGLOBIN: 9.6 g/dL — AB (ref 12.0–16.0)
MCH: 28.8 pg (ref 26.0–34.0)
MCHC: 34.7 g/dL (ref 32.0–36.0)
MCV: 82.9 fL (ref 80.0–100.0)
Platelets: 140 10*3/uL — ABNORMAL LOW (ref 150–440)
RBC: 3.33 MIL/uL — ABNORMAL LOW (ref 3.80–5.20)
RDW: 13.9 % (ref 11.5–14.5)
WBC: 3.4 10*3/uL — AB (ref 3.6–11.0)

## 2017-09-21 MED ORDER — IOPAMIDOL (ISOVUE-300) INJECTION 61%
100.0000 mL | Freq: Once | INTRAVENOUS | Status: AC | PRN
  Administered 2017-09-21: 100 mL via INTRAVENOUS

## 2017-09-21 MED ORDER — SODIUM CHLORIDE 0.9 % IV BOLUS (SEPSIS)
1000.0000 mL | Freq: Once | INTRAVENOUS | Status: AC
  Administered 2017-09-21: 1000 mL via INTRAVENOUS

## 2017-09-21 MED ORDER — LOSARTAN POTASSIUM 50 MG PO TABS
50.0000 mg | ORAL_TABLET | Freq: Once | ORAL | Status: AC
  Administered 2017-09-21: 50 mg via ORAL
  Filled 2017-09-21: qty 1

## 2017-09-21 MED ORDER — ONDANSETRON HCL 4 MG/2ML IJ SOLN
4.0000 mg | Freq: Once | INTRAMUSCULAR | Status: AC
  Administered 2017-09-21: 4 mg via INTRAVENOUS
  Filled 2017-09-21: qty 2

## 2017-09-21 MED ORDER — MAGNESIUM CITRATE PO SOLN
0.5000 | Freq: Once | ORAL | Status: AC
  Administered 2017-09-21: 0.5
  Filled 2017-09-21: qty 296

## 2017-09-21 MED ORDER — POLYETHYLENE GLYCOL 3350 17 G PO PACK: 17.0000 g | PACK | Freq: Every day | ORAL | 0 refills | Status: DC | PRN

## 2017-09-21 MED ORDER — MORPHINE SULFATE (PF) 4 MG/ML IV SOLN
4.0000 mg | Freq: Once | INTRAVENOUS | Status: AC
Start: 2017-09-21 — End: 2017-09-21
  Administered 2017-09-21: 4 mg via INTRAVENOUS
  Filled 2017-09-21: qty 1

## 2017-09-21 MED ORDER — DOCUSATE SODIUM 50 MG/5ML PO LIQD
100.0000 mg | Freq: Once | ORAL | Status: AC
  Administered 2017-09-21: 100 mg
  Filled 2017-09-21: qty 10

## 2017-09-21 MED ORDER — HYDROCHLOROTHIAZIDE 25 MG PO TABS
12.5000 mg | ORAL_TABLET | Freq: Once | ORAL | Status: AC
  Administered 2017-09-21: 12.5 mg via ORAL
  Filled 2017-09-21: qty 1

## 2017-09-21 MED ORDER — ONDANSETRON HCL 4 MG PO TABS: 4.0000 mg | ORAL_TABLET | Freq: Every day | ORAL | 0 refills | Status: DC | PRN
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
## 2017-09-21 NOTE — ED Notes (Signed)
Attempt report. Was told by receiving nurse that pt blood pressure needs be to lowered before transport.

## 2017-09-21 NOTE — ED Notes (Signed)
Pt stating that she would like to remain on the toilet at this time. Pt statin that she has been able to move some of her bowels. Pt in NAD at this time.

## 2017-09-21 NOTE — Telephone Encounter (Signed)
Pt calling to c/o abdominal pain, vomiting and fever. Pt c/o pain 9/10 that hurts to the left side of her umbilicus. She states it feels like pressure. She states she cannot stand up strainght and is more comfortable "slumped over". She states she cannot keep anything down . Pt started Sat. Pt advised to have someone drive her to the ED now per protocol. Advised pt to have someone drive her. She states she will call someone now.  Reason for Disposition . [1] SEVERE pain (e.g., excruciating) AND [2] present > 1 hour  Answer Assessment - Initial Assessment Questions 1. LOCATION: "Where does it hurt?"      Left side of belly button 2. RADIATION: "Does the pain shoot anywhere else?" (e.g., chest, back)     No  3. ONSET: "When did the pain begin?" (e.g., minutes, hours or days ago)      Pain in abdomen started Saturday morning 4. SUDDEN: "Gradual or sudden onset?"     Came on gradually 5. PATTERN "Does the pain come and go, or is it constant?"    - If constant: "Is it getting better, staying the same, or worsening?"      (Note: Constant means the pain never goes away completely; most serious pain is constant and it progresses)     - If intermittent: "How long does it last?" "Do you have pain now?"     (Note: Intermittent means the pain goes away completely between bouts)     Constant- pain getting worse 6. SEVERITY: "How bad is the pain?"  (e.g., Scale 1-10; mild, moderate, or severe)   - MILD (1-3): doesn't interfere with normal activities, abdomen soft and not tender to touch    - MODERATE (4-7): interferes with normal activities or awakens from sleep, tender to touch    - SEVERE (8-10): excruciating pain, doubled over, unable to do any normal activities      9/10 7. RECURRENT SYMPTOM: "Have you ever had this type of abdominal pain before?" If so, ask: "When was the last time?" and "What happened that time?"      No  8. CAUSE: "What do you think is causing the abdominal pain?"     Thought it  was because I couldn't go to the bathroom to have a BM. But pt did have a BM but it was a small amount of formed stool  9. RELIEVING/AGGRAVATING FACTORS: "What makes it better or worse?" (e.g., movement, antacids, bowel movement)     Better: when sitting on the commode Worse: laying down 10. OTHER SYMPTOMS: "Has there been any vomiting, diarrhea, constipation, or urine problems?"       Vomiting, constipation (pt denies a h/o constipation), no problems with urination 11. PREGNANCY: "Is there any chance you are pregnant?" "When was your last menstrual period?"       No Hasn't had period since 2009  Protocols used: ABDOMINAL PAIN - [facility]

## 2017-09-21 NOTE — ED Notes (Signed)
MD notified of patient c/o feeling like she was going to vomit. VORB for 4 more mg of Zofran, will hold PO meds and not PO challenge patient until nausea is under control. MD aware at this time.

## 2017-09-21 NOTE — ED Notes (Signed)
Dr. Clearnce Hasten in to speak with pt.

## 2017-09-21 NOTE — ED Notes (Signed)
Dajea, RN, updated as to plan of care.

## 2017-09-21 NOTE — ED Triage Notes (Signed)
Pt to ED c/o LLQ abd pain since Saturday intermittently, sharp pain with nausea and vomiting x3 today.  Denies diarrhea or urinary symptoms.

## 2017-09-21 NOTE — ED Notes (Signed)
Pt's family member asking that we do not give pt morphine again because pt was stating, "that's what we give people when they are dying." Nurse stating that she understood and explained they do but we use it for all kinds of pain control.

## 2017-09-22 ENCOUNTER — Other Ambulatory Visit: Payer: Self-pay

## 2017-09-22 ENCOUNTER — Observation Stay: Payer: 59

## 2017-09-22 DIAGNOSIS — C50919 Malignant neoplasm of unspecified site of unspecified female breast: Secondary | ICD-10-CM | POA: Diagnosis not present

## 2017-09-22 DIAGNOSIS — D7389 Other diseases of spleen: Secondary | ICD-10-CM | POA: Diagnosis not present

## 2017-09-22 DIAGNOSIS — K529 Noninfective gastroenteritis and colitis, unspecified: Secondary | ICD-10-CM | POA: Diagnosis not present

## 2017-09-22 DIAGNOSIS — I10 Essential (primary) hypertension: Secondary | ICD-10-CM | POA: Diagnosis not present

## 2017-09-22 LAB — CBC
HCT: 26.6 % — ABNORMAL LOW (ref 35.0–47.0)
HEMOGLOBIN: 9 g/dL — AB (ref 12.0–16.0)
MCH: 28.2 pg (ref 26.0–34.0)
MCHC: 33.8 g/dL (ref 32.0–36.0)
MCV: 83.4 fL (ref 80.0–100.0)
Platelets: 134 10*3/uL — ABNORMAL LOW (ref 150–440)
RBC: 3.19 MIL/uL — AB (ref 3.80–5.20)
RDW: 13.9 % (ref 11.5–14.5)
WBC: 4.2 10*3/uL (ref 3.6–11.0)

## 2017-09-22 LAB — BASIC METABOLIC PANEL
ANION GAP: 9 (ref 5–15)
BUN: 36 mg/dL — ABNORMAL HIGH (ref 6–20)
CALCIUM: 8.4 mg/dL — AB (ref 8.9–10.3)
CHLORIDE: 105 mmol/L (ref 101–111)
CO2: 21 mmol/L — AB (ref 22–32)
CREATININE: 1.34 mg/dL — AB (ref 0.44–1.00)
GFR calc Af Amer: 52 mL/min — ABNORMAL LOW (ref >60)
GFR calc non Af Amer: 45 mL/min — ABNORMAL LOW (ref >60)
Glucose, Bld: 85 mg/dL (ref 65–99)
Potassium: 3.7 mmol/L (ref 3.5–5.1)
Sodium: 135 mmol/L (ref 135–145)

## 2017-09-22 MED ORDER — OXYCODONE HCL 5 MG PO TABS: 5.0000 mg | ORAL_TABLET | ORAL | Status: DC | PRN

## 2017-09-22 MED ORDER — CIPROFLOXACIN IN D5W 400 MG/200ML IV SOLN
400.0000 mg | Freq: Two times a day (BID) | INTRAVENOUS | Status: DC
Start: 2017-09-22 — End: 2017-09-22
  Administered 2017-09-22: 400 mg via INTRAVENOUS
  Filled 2017-09-22 (×3): qty 200

## 2017-09-22 MED ORDER — ONDANSETRON HCL 4 MG PO TABS
4.0000 mg | ORAL_TABLET | Freq: Four times a day (QID) | ORAL | Status: DC | PRN
  Administered 2017-09-22: 4 mg via ORAL
  Filled 2017-09-22: qty 1

## 2017-09-22 MED ORDER — LOSARTAN POTASSIUM-HCTZ 50-12.5 MG PO TABS: 1.0000 | ORAL_TABLET | Freq: Every day | ORAL | Status: DC

## 2017-09-22 MED ORDER — FAMOTIDINE 20 MG PO TABS
10.0000 mg | ORAL_TABLET | Freq: Every day | ORAL | Status: DC
  Administered 2017-09-22: 10:00:00 10 mg via ORAL
  Filled 2017-09-22: qty 1

## 2017-09-22 MED ORDER — ONDANSETRON HCL 4 MG PO TABS: 4.0000 mg | ORAL_TABLET | Freq: Three times a day (TID) | ORAL | 0 refills | Status: DC | PRN

## 2017-09-22 MED ORDER — CIPROFLOXACIN HCL 500 MG PO TABS: 500.0000 mg | ORAL_TABLET | Freq: Two times a day (BID) | ORAL | 0 refills | Status: AC

## 2017-09-22 MED ORDER — FAMOTIDINE 200 MG/20ML IV SOLN: 10.0000 mg | Freq: Once | INTRAVENOUS | Status: DC

## 2017-09-22 MED ORDER — ENOXAPARIN SODIUM 40 MG/0.4ML ~~LOC~~ SOLN
40.0000 mg | SUBCUTANEOUS | Status: DC
  Filled 2017-09-22: qty 0.4

## 2017-09-22 MED ORDER — HYDROCHLOROTHIAZIDE 12.5 MG PO CAPS
12.5000 mg | ORAL_CAPSULE | Freq: Every day | ORAL | Status: DC
  Administered 2017-09-22: 12.5 mg via ORAL
  Filled 2017-09-22: qty 1

## 2017-09-22 MED ORDER — CLONIDINE HCL 0.2 MG/24HR TD PTWK
0.2000 mg | MEDICATED_PATCH | TRANSDERMAL | Status: DC
  Filled 2017-09-22: qty 1

## 2017-09-22 MED ORDER — ACETAMINOPHEN 325 MG PO TABS: 650.0000 mg | ORAL_TABLET | Freq: Four times a day (QID) | ORAL | Status: DC | PRN

## 2017-09-22 MED ORDER — LETROZOLE 2.5 MG PO TABS
2.5000 mg | ORAL_TABLET | Freq: Every day | ORAL | Status: DC
  Administered 2017-09-22: 10:00:00 2.5 mg via ORAL
  Filled 2017-09-22: qty 1

## 2017-09-22 MED ORDER — LOSARTAN POTASSIUM 50 MG PO TABS
50.0000 mg | ORAL_TABLET | Freq: Every day | ORAL | Status: DC
  Administered 2017-09-22: 50 mg via ORAL
  Filled 2017-09-22: qty 1

## 2017-09-22 MED ORDER — SODIUM CHLORIDE 0.9 % IV SOLN
INTRAVENOUS | Status: AC
  Administered 2017-09-22: 04:00:00 via INTRAVENOUS

## 2017-09-22 MED ORDER — CLONIDINE HCL 0.2 MG/24HR TD PTWK: 0.2000 mg | MEDICATED_PATCH | TRANSDERMAL | Status: DC

## 2017-09-22 MED ORDER — ACETAMINOPHEN 650 MG RE SUPP: 650.0000 mg | Freq: Four times a day (QID) | RECTAL | Status: DC | PRN

## 2017-09-22 MED ORDER — FAMOTIDINE IN NACL 20-0.9 MG/50ML-% IV SOLN
20.0000 mg | Freq: Once | INTRAVENOUS | Status: AC
  Administered 2017-09-22: 20 mg via INTRAVENOUS
  Filled 2017-09-22: qty 50

## 2017-09-22 MED ORDER — ONDANSETRON HCL 4 MG/2ML IJ SOLN: 4.0000 mg | Freq: Four times a day (QID) | INTRAMUSCULAR | Status: DC | PRN

## 2017-09-22 MED ORDER — ENSURE ENLIVE PO LIQD: 237.0000 mL | Freq: Two times a day (BID) | ORAL | Status: DC

## 2017-09-22 MED ORDER — METRONIDAZOLE IN NACL 5-0.79 MG/ML-% IV SOLN
500.0000 mg | Freq: Three times a day (TID) | INTRAVENOUS | Status: DC
  Administered 2017-09-22: 10:00:00 500 mg via INTRAVENOUS
  Filled 2017-09-22 (×4): qty 100

## 2017-09-22 MED ORDER — ENSURE ENLIVE PO LIQD: 237.0000 mL | Freq: Two times a day (BID) | ORAL | 12 refills | Status: DC

## 2017-09-22 NOTE — ED Notes (Signed)
Attempted to call report but nurse was unavailable at this time.

## 2017-09-22 NOTE — ED Notes (Signed)
Pt brought to [place] via stretcher.

## 2017-09-22 NOTE — Progress Notes (Signed)
[place], [place].   09/22/2017  Patient: Melanie Holloway   Date of Birth:  1966/08/02  Date of admission:  09/21/2017  Date of Discharge  09/22/2017    To Whom it May Concern:   Keilynn Marano  Can return to work 09/24/2017   If you have any questions or concerns, please don't hesitate to call.  Sincerely,   Neita Carp M.D Office : [phone]   .

## 2017-09-22 NOTE — ED Notes (Signed)
Pt in NAD and resting with eyes closed. Pt asking when the admitting doctor with be in, nurse explained that there were several admits and that he would be in momentarily.
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
## 2017-09-22 NOTE — Progress Notes (Signed)
Initial Nutrition Assessment  DOCUMENTATION CODES:   Obesity unspecified  INTERVENTION:   Ensure Enlive po BID, each supplement provides 350 kcal and 20 grams of protein  NUTRITION DIAGNOSIS:   Inadequate oral intake related to acute illness, poor appetite, nausea as evidenced by per patient/family report.  GOAL:   Patient will meet greater than or equal to 90% of their needs  MONITOR:   PO intake, Supplement acceptance, Weight trends, Diet advancement, I & O's  REASON FOR ASSESSMENT:   Malnutrition Screening Tool    ASSESSMENT:   Pt with PMH of HTN, breast cancer (on Femara) and lupus with skin lesions presents with abdominal pain and nausea found gastroenteritis    Per chart, pt s/p CT scan 12/05 revealing a new metastasis, likely spinal and questionable splenic lesion  Pt not talkative at visit, providing short answers.  Pt reports she had poor oral intake prior to coming to the hospital however did not share time frame. Pt reports her appetite is now increasing and "getting back to normal".   Pt reports no recent weight changes. Per chart, pt's weight is stable.   Labs reviewed; BUN 36, Creatinine 1.34, Albumin 2.8 (12/04), Hemoglobin 9.0 Medications reviewed; Pepcid, Femara  NUTRITION - FOCUSED PHYSICAL EXAM:    Most Recent Value  Orbital Region  No depletion  Upper Arm Region  No depletion  Thoracic and Lumbar Region  No depletion  Buccal Region  No depletion  Temple Region  Mild depletion  Clavicle Bone Region  Mild depletion  Clavicle and Acromion Bone Region  No depletion  Scapular Bone Region  Unable to assess  Dorsal Hand  No depletion  Patellar Region  No depletion  Anterior Thigh Region  No depletion  Posterior Calf Region  No depletion  Edema (RD Assessment)  None       Diet Order:  Diet full liquid Room service appropriate? Yes; Fluid consistency: Thin  EDUCATION NEEDS:   No education needs have been identified at this time  Skin:   Skin Assessment: Reviewed RN Assessment  Last BM:  09/22/17  Height:   Ht Readings from Last 1 Encounters:  09/22/17 4\' 11"  (1.499 m)    Weight:   Wt Readings from Last 1 Encounters:  09/22/17 154 lb 1.6 oz (69.9 kg)    Ideal Body Weight:  45 kg  BMI:  Body mass index is 31.12 kg/m.  Estimated Nutritional Needs:   Kcal:  1600-1800  Protein:  85-95  Fluid:  >/= 1.6 L/d  [place] Ranger, MS, RDN, LDN 09/22/2017 2:43 PM

## 2017-09-22 NOTE — Progress Notes (Signed)
Patient d/c home escorted via w/c by volunteer to private vehicle. D/C instructions, follow up appts and prescriptions reviewed with patient and daughters at bedside. IV removed without complication.

## 2017-09-23 ENCOUNTER — Telehealth: Payer: Self-pay | Admitting: *Deleted

## 2017-09-23 LAB — HIV ANTIBODY (ROUTINE TESTING W REFLEX): HIV Screen 4th Generation wRfx: NONREACTIVE

## 2017-09-23 NOTE — Telephone Encounter (Signed)
Transition Care Management Follow-up Telephone Call   Date discharged? 09/22/2017   How have you been since you were released from the hospital? "a lot better"   Do you understand why you were in the hospital? yes   Do you understand the discharge instructions? yes   Where were you discharged to? home   Items Reviewed:  Medications reviewed: yes  Allergies reviewed: yes  Dietary changes reviewed: yes  Referrals reviewed: yes   Functional Questionnaire:  Activities of Daily Living (ADLs):   She states they are independent in the following:in all areas and has help at home should she need it   Any transportation issues/concerns?: no   Any patient concerns? no   Confirmed importance and date/time of follow-up visits scheduled yes  Provider Appointment booked with Dr Danise Mina 09/29/17 @ 63  Confirmed with patient if condition begins to worsen call PCP or go to the ER.  Patient was given the office number and encouraged to call back with question or concerns.  : yes

## 2017-09-24 ENCOUNTER — Other Ambulatory Visit: Payer: Self-pay | Admitting: Oncology

## 2017-09-29 ENCOUNTER — Ambulatory Visit (INDEPENDENT_AMBULATORY_CARE_PROVIDER_SITE_OTHER): Payer: 59 | Admitting: Family Medicine

## 2017-09-29 ENCOUNTER — Encounter: Payer: Self-pay | Admitting: Family Medicine

## 2017-09-29 VITALS — BP 124/80 | HR 102 | Temp 98.6°F | Wt 151.0 lb

## 2017-09-29 DIAGNOSIS — I10 Essential (primary) hypertension: Secondary | ICD-10-CM

## 2017-09-29 DIAGNOSIS — R801 Persistent proteinuria, unspecified: Secondary | ICD-10-CM

## 2017-09-29 DIAGNOSIS — L930 Discoid lupus erythematosus: Secondary | ICD-10-CM

## 2017-09-29 DIAGNOSIS — R161 Splenomegaly, not elsewhere classified: Secondary | ICD-10-CM | POA: Diagnosis not present

## 2017-09-29 DIAGNOSIS — C50512 Malignant neoplasm of lower-outer quadrant of left female breast: Secondary | ICD-10-CM

## 2017-09-29 DIAGNOSIS — A084 Viral intestinal infection, unspecified: Secondary | ICD-10-CM

## 2017-09-29 DIAGNOSIS — K649 Unspecified hemorrhoids: Secondary | ICD-10-CM | POA: Diagnosis not present

## 2017-09-29 DIAGNOSIS — R634 Abnormal weight loss: Secondary | ICD-10-CM | POA: Diagnosis not present

## 2017-09-29 DIAGNOSIS — Z170 Estrogen receptor positive status [ER+]: Secondary | ICD-10-CM

## 2017-09-29 DIAGNOSIS — R319 Hematuria, unspecified: Secondary | ICD-10-CM

## 2017-09-29 DIAGNOSIS — R935 Abnormal findings on diagnostic imaging of other abdominal regions, including retroperitoneum: Secondary | ICD-10-CM

## 2017-09-29 LAB — POC URINALSYSI DIPSTICK (AUTOMATED)
Bilirubin, UA: NEGATIVE
Glucose, UA: NEGATIVE
Ketones, UA: NEGATIVE
LEUKOCYTES UA: NEGATIVE
Nitrite, UA: NEGATIVE
PH UA: 6 (ref 5.0–8.0)
Spec Grav, UA: 1.02 (ref 1.010–1.025)
UROBILINOGEN UA: 0.2 U/dL

## 2017-09-29 MED ORDER — HYDROCORTISONE 2.5 % RE CREA: 1.0000 "application " | TOPICAL_CREAM | Freq: Two times a day (BID) | RECTAL | 0 refills | Status: DC

## 2017-09-29 NOTE — Assessment & Plan Note (Signed)
Chronic, stable. Continue current regimen. 

## 2017-09-29 NOTE — Addendum Note (Signed)
Addended by: Brenton Grills on: 35/00/9381 12:47 PM   Modules accepted: Orders

## 2017-09-29 NOTE — Assessment & Plan Note (Signed)
30 lb weight loss noted in the last 18 months, not really trying.

## 2017-09-29 NOTE — Assessment & Plan Note (Signed)
H/o proteinuria, hematuria, and positive ANA followed by renal. Has deferred renal biopsy in the past. Last seen 04/2017.

## 2017-09-29 NOTE — Assessment & Plan Note (Signed)
Rx anusol cream, rec sitz baths, update if not improving with treatment.

## 2017-09-29 NOTE — Assessment & Plan Note (Signed)
Abnormal CT abd/pelvis - enlarged lymph node mass along R common iliac vein as well as new lytic appearing lesion R L2 vertebral body as well as vague hypodensity within spleen. Reviewed with patient, provided with imaging per her request. Encouraged she keep f/u with onc next week for further evaluation. Pt feels well today.

## 2017-09-29 NOTE — Assessment & Plan Note (Addendum)
H/o proteinuria, hematuria, and positive ANA followed by renal. Has deferred renal biopsy in the past. Found again in hospital. Will rpt UA/micro today. Pt denies gross hematuria or UTI sxs.

## 2017-09-29 NOTE — Progress Notes (Addendum)
BP 124/80 (BP Location: Left Arm, Patient Position: Sitting, Cuff Size: Normal)   Pulse (!) 102   Temp 98.6 F (37 C) (Oral)   Wt 151 lb (68.5 kg)   SpO2 100%   BMI 30.50 kg/m    CC: hosp f/u visit Subjective:    Patient ID: Makailah Slavick, female    DOB: July 14, 1966, 51 y.o.   MRN: [id number]  HPI: Vidalia Serpas is a 51 y.o. female presenting on 09/29/2017 for Hospitalization Follow-up (Admitted 09/21/17 to [facility] via ED visit for GI issues. Had 2 hemorrhoids that have decreased in size)   Recent hospitalization for severe abdominal pain, vomiting, diarrhea. Found to have jejunal inflammation on CT consistent with gastroenteritis. Multiple gallstones with mild pericholecystic fluid were also found along with mild ascites. Imaging also showed enlarged lymph node mass along R common iliac vein as well as new lytic appearing lesion R L2 vertebral body as well as vague hypodensity within spleen. F/u spleen US showed 1.5cm mass of unclear etiology. She has scheduled f/u with Dr Jana Hakim 10/04/2017 to discuss these findings.   Since home, she has started feeling better. zofran helped the nausea. No more vomiting/diarrhea since weekend. No fevers/chills, abd pain. Now back to eating regular food. Requests note for return to work tomorrow.  Currently with active hemorrhoids that are painful to sit - no pain or bleeding with BM.  UA in hospital with significant blood. Unclear cause - labs   Denies back pain, fevers/chills, coughing or dyspnea.   She has known ER positive stage III breast cancer treated with chemo and lumpectomy/axillary dissection 2008 followed by simple mastectomy 2009. Previous imaging did not show evidence of metastatic disease.  She has known tumid skin limited lupus.   Date of admission: 09/21/2017 Date of discharge: 09/22/2017 TCM hosp f/u phone call completed: 09/23/2017  No discharge summary available at this time.   Relevant past medical, surgical, family and social  history reviewed and updated as indicated. Interim medical history since our last visit reviewed. Allergies and medications reviewed and updated. Outpatient Medications Prior to Visit  Medication Sig Dispense Refill  . Cholecalciferol (VITAMIN D) 2000 UNITS CAPS Take 1 capsule (2,000 Units total) by mouth daily. 30 capsule   . cloNIDine (CATAPRES - DOSED IN MG/24 HR) 0.2 mg/24hr patch PLACE 1 PATCH (0.2 MG TOTAL) ONTO THE SKIN ONCE A WEEK. 12 patch 0  . feeding supplement, ENSURE ENLIVE, (ENSURE ENLIVE) LIQD Take 237 mLs by mouth 2 (two) times daily between meals. 237 mL 12  . letrozole (FEMARA) 2.5 MG tablet Take 2.5 mg by mouth daily.  1  . losartan-hydrochlorothiazide (HYZAAR) 50-12.5 MG tablet TAKE 1 TABLET BY MOUTH DAILY. 90 tablet 0  . ondansetron (ZOFRAN) 4 MG tablet Take 1 tablet (4 mg total) by mouth every 8 (eight) hours as needed for nausea or vomiting. 10 tablet 0  . triamcinolone cream (KENALOG) 0.1 % Apply 1 application topically 2 (two) times daily. Apply to AA. 453.6 g 0  . fluocinonide ointment (LIDEX) 0.05 % Apply topically 2 (two) times daily. No more than 2 wks at a time 60 g 1   No facility-administered medications prior to visit.      Per HPI unless specifically indicated in ROS section below Review of Systems     Objective:    BP 124/80 (BP Location: Left Arm, Patient Position: Sitting, Cuff Size: Normal)   Pulse (!) 102   Temp 98.6 F (37 C) (Oral)   Wt 151 lb (  68.5 kg)   SpO2 100%   BMI 30.50 kg/m   Wt Readings from Last 3 Encounters:  09/29/17 151 lb (68.5 kg)  09/22/17 154 lb 1.6 oz (69.9 kg)  09/08/17 152 lb 3.2 oz (69 kg)    Physical Exam  Constitutional: She is oriented to person, place, and time. She appears well-developed and well-nourished. No distress.  HENT:  Mouth/Throat: Oropharynx is clear and moist. No oropharyngeal exudate.  Eyes: Conjunctivae and EOM are normal. Pupils are equal, round, and reactive to light. No scleral icterus.  Neck:  Normal range of motion. Neck supple. No thyromegaly present.  Cardiovascular: Normal rate, regular rhythm, normal heart sounds and intact distal pulses.  No murmur heard. Pulmonary/Chest: Effort normal and breath sounds normal. No respiratory distress. She has no wheezes. She has no rales.  Abdominal: Soft. Normal appearance and bowel sounds are normal. She exhibits no distension and no mass. There is no hepatosplenomegaly. There is no tenderness. There is no rigidity, no rebound, no guarding, no CVA tenderness and negative Murphy's sign.  Musculoskeletal: She exhibits no edema.  Lymphadenopathy:    She has no cervical adenopathy.  Neurological: She is alert and oriented to person, place, and time.  Skin: Skin is warm and dry.  Psychiatric: She has a normal mood and affect.  Nursing note and vitals reviewed.  Results for orders placed or performed in visit on 09/29/17  POCT Urinalysis Dipstick (Automated)  Result Value Ref Range   Color, UA yellow    Clarity, UA clear    Glucose, UA negative    Bilirubin, UA negative    Ketones, UA negative    Spec Grav, UA 1.020 1.010 - 1.025   Blood, UA 3+    pH, UA 6.0 5.0 - 8.0   Protein, UA 2+    Urobilinogen, UA 0.2 0.2 or 1.0 E.U./dL   Nitrite, UA negative    Leukocytes, UA Negative Negative      Assessment & Plan:   Problem List Items Addressed This Visit    Abnormal CT scan, pelvis    Abnormal CT abd/pelvis - enlarged lymph node mass along R common iliac vein as well as new lytic appearing lesion R L2 vertebral body as well as vague hypodensity within spleen. Reviewed with patient, provided with imaging per her request. Encouraged she keep f/u with onc next week for further evaluation. Pt feels well today.       Essential hypertension    Chronic, stable. Continue current regimen.       Hematuria    H/o proteinuria, hematuria, and positive ANA followed by renal. Has deferred renal biopsy in the past. Found again in hospital. Will rpt  UA/micro today. Pt denies gross hematuria or UTI sxs.       Relevant Orders   POCT Urinalysis Dipstick (Automated) (Completed)   Hemorrhoid    Rx anusol cream, rec sitz baths, update if not improving with treatment.       Lupus erythematosus tumidus   Malignant neoplasm of lower-outer quadrant of left breast of female, estrogen receptor positive (HCC)   Proteinuria    H/o proteinuria, hematuria, and positive ANA followed by renal. Has deferred renal biopsy in the past. Last seen 04/2017.       Splenic mass    Discussed finding and that we don't know etiology at this time. Has f/u with onc next week.       Viral gastroenteritis - Primary    Anticipate viral given short duration and  progressive improvement over last few days. Reviewed with patient, encouraged advancing diet as tolerated.       Weight loss    30 lb weight loss noted in the last 18 months, not really trying.           Follow up plan: Return if symptoms worsen or fail to improve.  Ria Bush, MD

## 2017-09-29 NOTE — Assessment & Plan Note (Addendum)
Discussed finding and that we don't know etiology at this time. Has f/u with onc next week.

## 2017-09-29 NOTE — Patient Instructions (Addendum)
Continue current medicines. Continue advancing diet as tolerated. I'm glad you're feeling better. Letter for work provided today. Keep follow up with Dr Jana Hakim. May try steroid cream for hemorrhoids sent to pharmacy. Use sitz bath for hemorrhoids.  Urinalysis today

## 2017-09-29 NOTE — Assessment & Plan Note (Signed)
Anticipate viral given short duration and progressive improvement over last few days. Reviewed with patient, encouraged advancing diet as tolerated.
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
## 2017-10-04 ENCOUNTER — Ambulatory Visit (HOSPITAL_BASED_OUTPATIENT_CLINIC_OR_DEPARTMENT_OTHER): Payer: 59 | Admitting: Oncology

## 2017-10-04 VITALS — BP 176/108 | HR 111 | Temp 98.4°F | Resp 16 | Ht 59.0 in | Wt 152.8 lb

## 2017-10-04 DIAGNOSIS — D739 Disease of spleen, unspecified: Secondary | ICD-10-CM

## 2017-10-04 DIAGNOSIS — K529 Noninfective gastroenteritis and colitis, unspecified: Secondary | ICD-10-CM

## 2017-10-04 DIAGNOSIS — Z170 Estrogen receptor positive status [ER+]: Secondary | ICD-10-CM | POA: Diagnosis not present

## 2017-10-04 DIAGNOSIS — C50512 Malignant neoplasm of lower-outer quadrant of left female breast: Secondary | ICD-10-CM

## 2017-10-04 DIAGNOSIS — Z853 Personal history of malignant neoplasm of breast: Secondary | ICD-10-CM | POA: Diagnosis not present

## 2017-10-04 DIAGNOSIS — M899 Disorder of bone, unspecified: Secondary | ICD-10-CM | POA: Diagnosis not present
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
## 2017-10-08 ENCOUNTER — Telehealth: Payer: Self-pay | Admitting: Rheumatology

## 2017-10-08 NOTE — Telephone Encounter (Signed)
I LMOM for patient to call, and schedule new patient appt with Dr. Estanislado Pandy.

## 2017-10-08 NOTE — Telephone Encounter (Signed)
-----   Message from Carole Binning, LPN sent at 29/56/2130 12:50 PM EST ----- Faythe Ghee to schedule for new patient appointment per Dr. Estanislado Pandy ----- Message ----- From: Bo Merino, MD Sent: 09/14/2017   1:35 PM To: Carole Binning, LPN  Please review chart for new patient appointment referred by Dr. Jana Hakim. Bo Merino, MD ----- Message ----- From: Chauncey Cruel, MD Sent: 09/13/2017   3:58 PM To: Bo Merino, MD

## 2017-10-16 ENCOUNTER — Other Ambulatory Visit: Payer: Self-pay | Admitting: Family Medicine
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
## 2017-11-01 ENCOUNTER — Encounter (INDEPENDENT_AMBULATORY_CARE_PROVIDER_SITE_OTHER): Payer: Self-pay

## 2017-11-01 ENCOUNTER — Ambulatory Visit (INDEPENDENT_AMBULATORY_CARE_PROVIDER_SITE_OTHER): Payer: 59 | Admitting: Rheumatology

## 2017-11-01 ENCOUNTER — Encounter: Payer: Self-pay | Admitting: Rheumatology

## 2017-11-01 VITALS — BP 112/64 | HR 61 | Resp 13 | Ht 59.0 in | Wt 151.0 lb

## 2017-11-01 DIAGNOSIS — R768 Other specified abnormal immunological findings in serum: Secondary | ICD-10-CM

## 2017-11-01 DIAGNOSIS — D649 Anemia, unspecified: Secondary | ICD-10-CM | POA: Diagnosis not present

## 2017-11-01 DIAGNOSIS — Z170 Estrogen receptor positive status [ER+]: Secondary | ICD-10-CM

## 2017-11-01 DIAGNOSIS — E669 Obesity, unspecified: Secondary | ICD-10-CM | POA: Diagnosis not present

## 2017-11-01 DIAGNOSIS — L930 Discoid lupus erythematosus: Secondary | ICD-10-CM | POA: Diagnosis not present

## 2017-11-01 DIAGNOSIS — D561 Beta thalassemia: Secondary | ICD-10-CM | POA: Diagnosis not present

## 2017-11-01 DIAGNOSIS — R202 Paresthesia of skin: Secondary | ICD-10-CM | POA: Diagnosis not present

## 2017-11-01 DIAGNOSIS — C50512 Malignant neoplasm of lower-outer quadrant of left female breast: Secondary | ICD-10-CM | POA: Diagnosis not present

## 2017-11-01 DIAGNOSIS — E559 Vitamin D deficiency, unspecified: Secondary | ICD-10-CM

## 2017-11-01 DIAGNOSIS — I10 Essential (primary) hypertension: Secondary | ICD-10-CM

## 2017-11-01 DIAGNOSIS — E66811 Obesity, class 1: Secondary | ICD-10-CM

## 2017-11-04 ENCOUNTER — Ambulatory Visit (HOSPITAL_COMMUNITY)
Admission: RE | Admit: 2017-11-04 | Discharge: 2017-11-04 | Disposition: A | Discharge status: Home / Self Care | Payer: 59 | Source: Ambulatory Visit | Attending: Oncology | Admitting: Oncology

## 2017-11-04 ENCOUNTER — Encounter (HOSPITAL_COMMUNITY): Payer: 59

## 2017-11-04 ENCOUNTER — Encounter (HOSPITAL_COMMUNITY)
Admission: RE | Admit: 2017-11-04 | Discharge: 2017-11-04 | Disposition: A | Discharge status: Home / Self Care | Payer: 59 | Source: Ambulatory Visit | Attending: Oncology | Admitting: Oncology

## 2017-11-04 DIAGNOSIS — M899 Disorder of bone, unspecified: Secondary | ICD-10-CM | POA: Insufficient documentation

## 2017-11-04 DIAGNOSIS — Z170 Estrogen receptor positive status [ER+]: Secondary | ICD-10-CM | POA: Insufficient documentation

## 2017-11-04 DIAGNOSIS — C50512 Malignant neoplasm of lower-outer quadrant of left female breast: Secondary | ICD-10-CM | POA: Diagnosis not present

## 2017-11-04 DIAGNOSIS — R590 Localized enlarged lymph nodes: Secondary | ICD-10-CM | POA: Insufficient documentation

## 2017-11-04 DIAGNOSIS — R918 Other nonspecific abnormal finding of lung field: Secondary | ICD-10-CM | POA: Diagnosis not present

## 2017-11-04 DIAGNOSIS — C50919 Malignant neoplasm of unspecified site of unspecified female breast: Secondary | ICD-10-CM | POA: Diagnosis not present

## 2017-11-04 DIAGNOSIS — C7951 Secondary malignant neoplasm of bone: Secondary | ICD-10-CM | POA: Insufficient documentation

## 2017-11-04 DIAGNOSIS — D7389 Other diseases of spleen: Secondary | ICD-10-CM | POA: Diagnosis not present

## 2017-11-04 MED ORDER — IOPAMIDOL (ISOVUE-300) INJECTION 61%
100.0000 mL | Freq: Once | INTRAVENOUS | Status: AC | PRN
  Administered 2017-11-04: 100 mL via INTRAVENOUS

## 2017-11-04 MED ORDER — IOPAMIDOL (ISOVUE-300) INJECTION 61%
INTRAVENOUS | Status: AC
  Filled 2017-11-04: qty 30

## 2017-11-04 MED ORDER — TECHNETIUM TC 99M MEDRONATE IV KIT
25.0000 | PACK | Freq: Once | INTRAVENOUS | Status: AC | PRN
  Administered 2017-11-04: 20.7 via INTRAVENOUS

## 2017-11-04 MED ORDER — IOPAMIDOL (ISOVUE-300) INJECTION 61%
30.0000 mL | Freq: Once | INTRAVENOUS | Status: AC | PRN
  Administered 2017-11-04: 30 mL via ORAL

## 2017-11-04 MED ORDER — IOPAMIDOL (ISOVUE-300) INJECTION 61%
INTRAVENOUS | Status: AC
  Filled 2017-11-04: qty 100

## 2017-11-08 ENCOUNTER — Inpatient Hospital Stay: Payer: 59

## 2017-11-08 ENCOUNTER — Inpatient Hospital Stay: Payer: 59 | Attending: Oncology | Admitting: Oncology

## 2017-11-08 ENCOUNTER — Other Ambulatory Visit: Payer: Self-pay | Admitting: *Deleted

## 2017-11-08 DIAGNOSIS — D638 Anemia in other chronic diseases classified elsewhere: Secondary | ICD-10-CM

## 2017-11-08 DIAGNOSIS — I3131 Malignant pericardial effusion in diseases classified elsewhere: Secondary | ICD-10-CM

## 2017-11-08 DIAGNOSIS — J8489 Other specified interstitial pulmonary diseases: Secondary | ICD-10-CM

## 2017-11-08 DIAGNOSIS — I313 Pericardial effusion (noninflammatory): Secondary | ICD-10-CM | POA: Diagnosis not present

## 2017-11-08 DIAGNOSIS — R590 Localized enlarged lymph nodes: Secondary | ICD-10-CM | POA: Diagnosis not present

## 2017-11-08 DIAGNOSIS — M899 Disorder of bone, unspecified: Secondary | ICD-10-CM | POA: Insufficient documentation

## 2017-11-08 DIAGNOSIS — Z170 Estrogen receptor positive status [ER+]: Secondary | ICD-10-CM

## 2017-11-08 DIAGNOSIS — C7951 Secondary malignant neoplasm of bone: Secondary | ICD-10-CM

## 2017-11-08 DIAGNOSIS — C771 Secondary and unspecified malignant neoplasm of intrathoracic lymph nodes: Secondary | ICD-10-CM | POA: Insufficient documentation

## 2017-11-08 DIAGNOSIS — C801 Malignant (primary) neoplasm, unspecified: Secondary | ICD-10-CM

## 2017-11-08 DIAGNOSIS — I3139 Other pericardial effusion (noninflammatory): Secondary | ICD-10-CM | POA: Insufficient documentation

## 2017-11-08 DIAGNOSIS — C50512 Malignant neoplasm of lower-outer quadrant of left female breast: Secondary | ICD-10-CM

## 2017-11-08 DIAGNOSIS — Z853 Personal history of malignant neoplasm of breast: Secondary | ICD-10-CM

## 2017-11-08 DIAGNOSIS — D61818 Other pancytopenia: Secondary | ICD-10-CM

## 2017-11-08 LAB — CBC WITH DIFFERENTIAL/PLATELET
Basophils Absolute: 0 10*3/uL (ref 0.0–0.1)
Basophils Relative: 1 %
EOS PCT: 1 %
Eosinophils Absolute: 0 10*3/uL (ref 0.0–0.5)
HCT: 20.5 % — ABNORMAL LOW (ref 34.8–46.6)
Hemoglobin: 6.8 g/dL — CL (ref 11.6–15.9)
LYMPHS PCT: 43 %
Lymphs Abs: 1.2 10*3/uL (ref 0.9–3.3)
MCH: 28.9 pg (ref 25.1–34.0)
MCHC: 33.2 g/dL (ref 31.5–36.0)
MCV: 87.2 fL (ref 79.5–101.0)
MONO ABS: 0.2 10*3/uL (ref 0.1–0.9)
Monocytes Relative: 6 %
Neutro Abs: 1.3 10*3/uL — ABNORMAL LOW (ref 1.5–6.5)
Neutrophils Relative %: 49 %
PLATELETS: 94 10*3/uL — AB (ref 145–400)
RBC: 2.35 MIL/uL — AB (ref 3.70–5.45)
RDW: 16.2 % — AB (ref 11.2–16.1)
WBC: 2.7 10*3/uL — AB (ref 3.9–10.3)

## 2017-11-08 LAB — RETICULOCYTES
RBC.: 2.35 MIL/uL — ABNORMAL LOW (ref 3.70–5.45)
RETIC COUNT ABSOLUTE: 56.4 10*3/uL (ref 33.7–90.7)
Retic Ct Pct: 2.4 % — ABNORMAL HIGH (ref 0.7–2.1)

## 2017-11-08 LAB — COMPREHENSIVE METABOLIC PANEL
ALK PHOS: 63 U/L (ref 40–150)
ALT: 7 U/L (ref 0–55)
AST: 17 U/L (ref 5–34)
Albumin: 2.5 g/dL — ABNORMAL LOW (ref 3.5–5.0)
Anion gap: 7 (ref 3–11)
BILIRUBIN TOTAL: 0.5 mg/dL (ref 0.2–1.2)
BUN: 25 mg/dL (ref 7–26)
CALCIUM: 8.3 mg/dL — AB (ref 8.4–10.4)
CHLORIDE: 108 mmol/L (ref 98–109)
CO2: 23 mmol/L (ref 22–29)
CREATININE: 1.16 mg/dL — AB (ref 0.60–1.10)
GFR calc Af Amer: >60 mL/min (ref >60)
GFR, EST NON AFRICAN AMERICAN: 53 mL/min — AB (ref >60)
Glucose, Bld: 95 mg/dL (ref 70–140)
Potassium: 3.3 mmol/L (ref 3.3–4.7)
Sodium: 138 mmol/L (ref 136–145)
Total Protein: 7.5 g/dL (ref 6.4–8.3)

## 2017-11-08 LAB — SAVE SMEAR
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
## 2017-11-09 ENCOUNTER — Other Ambulatory Visit: Payer: Self-pay

## 2017-11-09 ENCOUNTER — Telehealth: Payer: Self-pay | Admitting: Oncology

## 2017-11-09 DIAGNOSIS — D638 Anemia in other chronic diseases classified elsewhere: Secondary | ICD-10-CM

## 2017-11-09 LAB — ABO/RH: ABO/RH(D): B POS

## 2017-11-09 LAB — PREPARE RBC (CROSSMATCH)

## 2017-11-09 LAB — SAMPLE TO BLOOD BANK

## 2017-11-09 NOTE — Telephone Encounter (Signed)
Left message for patient regarding upcoming January appointments per 1/21 los. Patient is scheduled for early morning appointments per patient request.

## 2017-11-09 NOTE — Telephone Encounter (Signed)
Left message for patient regarding upcoming February appointments.  °

## 2017-11-10 ENCOUNTER — Ambulatory Visit (HOSPITAL_COMMUNITY)
Admission: RE | Admit: 2017-11-10 | Discharge: 2017-11-10 | Disposition: A | Discharge status: Home / Self Care | Payer: 59 | Source: Ambulatory Visit | Attending: Oncology | Admitting: Oncology

## 2017-11-10 DIAGNOSIS — D649 Anemia, unspecified: Secondary | ICD-10-CM | POA: Diagnosis not present

## 2017-11-10 DIAGNOSIS — D638 Anemia in other chronic diseases classified elsewhere: Secondary | ICD-10-CM

## 2017-11-10 LAB — PREPARE RBC (CROSSMATCH)

## 2017-11-10 MED ORDER — SODIUM CHLORIDE 0.9 % IV SOLN
250.0000 mL | Freq: Once | INTRAVENOUS | Status: AC
  Administered 2017-11-10: 250 mL via INTRAVENOUS

## 2017-11-10 MED ORDER — SODIUM CHLORIDE 0.9 % IV SOLN: 250.0000 mL | Freq: Once | INTRAVENOUS | Status: DC

## 2017-11-10 MED ORDER — SODIUM CHLORIDE 0.9% FLUSH: 10.0000 mL | INTRAVENOUS | Status: DC | PRN

## 2017-11-10 MED ORDER — DIPHENHYDRAMINE HCL 25 MG PO CAPS
25.0000 mg | ORAL_CAPSULE | Freq: Once | ORAL | Status: AC
  Administered 2017-11-10: 25 mg via ORAL
  Filled 2017-11-10: qty 1

## 2017-11-10 MED ORDER — SODIUM CHLORIDE 0.9 % IV SOLN: Freq: Once | INTRAVENOUS | Status: DC

## 2017-11-10 MED ORDER — ACETAMINOPHEN 325 MG PO TABS
650.0000 mg | ORAL_TABLET | Freq: Once | ORAL | Status: AC
  Administered 2017-11-10: 650 mg via ORAL
  Filled 2017-11-10: qty 2

## 2017-11-10 MED ORDER — HEPARIN SOD (PORK) LOCK FLUSH 100 UNIT/ML IV SOLN: 500.0000 [IU] | Freq: Every day | INTRAVENOUS | Status: DC | PRN

## 2017-11-10 NOTE — Discharge Instructions (Signed)

## 2017-11-10 NOTE — Progress Notes (Signed)
PATIENT CARE CENTER NOTE  Diagnosis: Anemia   Provider: Dr. Jana Hakim   Procedure: 2 units PRBC's   Note: Patient received transfusion of 2 units of blood. Patient tolerated transfusion with no adverse reaction. Discharge instructions given to patient. Patient alert, oriented, ambulatory and in stable condition at discharge.

## 2017-11-11 ENCOUNTER — Telehealth: Payer: Self-pay | Admitting: *Deleted

## 2017-11-11 ENCOUNTER — Ambulatory Visit (HOSPITAL_COMMUNITY)
Admission: RE | Admit: 2017-11-11 | Discharge: 2017-11-11 | Disposition: A | Discharge status: Home / Self Care | Payer: 59 | Source: Ambulatory Visit | Attending: Oncology | Admitting: Oncology

## 2017-11-11 DIAGNOSIS — J8489 Other specified interstitial pulmonary diseases: Secondary | ICD-10-CM | POA: Diagnosis not present

## 2017-11-11 DIAGNOSIS — D61818 Other pancytopenia: Secondary | ICD-10-CM | POA: Diagnosis not present

## 2017-11-11 DIAGNOSIS — C7951 Secondary malignant neoplasm of bone: Secondary | ICD-10-CM | POA: Diagnosis not present

## 2017-11-11 DIAGNOSIS — I3131 Malignant pericardial effusion in diseases classified elsewhere: Secondary | ICD-10-CM

## 2017-11-11 DIAGNOSIS — C801 Malignant (primary) neoplasm, unspecified: Secondary | ICD-10-CM

## 2017-11-11 DIAGNOSIS — I313 Pericardial effusion (noninflammatory): Secondary | ICD-10-CM | POA: Insufficient documentation

## 2017-11-11 DIAGNOSIS — C771 Secondary and unspecified malignant neoplasm of intrathoracic lymph nodes: Secondary | ICD-10-CM | POA: Insufficient documentation

## 2017-11-11 LAB — TYPE AND SCREEN
ABO/RH(D): B POS
Antibody Screen: NEGATIVE
Unit division: 0
Unit division: 0

## 2017-11-11 LAB — ECHOCARDIOGRAM COMPLETE
AVLVOTPG: 4 mmHg
AVPHT: 362 ms
Ao-asc: 32 cm
CHL CUP RV SYS PRESS: 36 mmHg
FS: 21 % — AB (ref 28–44)
IVS/LV PW RATIO, ED: 1.12
LA ID, A-P, ES: 36 mm
LA diam index: 2.21 cm/m2
LA vol A4C: 40 ml
LA vol index: 32.1 mL/m2
LAVOL: 52.4 mL
LEFT ATRIUM END SYS DIAM: 36 mm
LVOT SV: 38 mL
LVOT VTI: 15.1 cm
LVOT area: 2.54 cm2
LVOTD: 18 mm
LVOTPV: 96 cm/s
PW: 9.83 mm — AB (ref 0.6–1.1)
RV LATERAL S' VELOCITY: 11.9 cm/s
Reg peak vel: 278 cm/s
TAPSE: 17.1 mm
TDI e' medial: 2.94
TR max vel: 278 cm/s

## 2017-11-11 LAB — BPAM RBC
BLOOD PRODUCT EXPIRATION DATE: 201902072359
Blood Product Expiration Date: 201902192359
ISSUE DATE / TIME: 201901230858
ISSUE DATE / TIME: 201901230858
UNIT TYPE AND RH: 7300
Unit Type and Rh: 7300

## 2017-11-11 NOTE — Telephone Encounter (Signed)
This RN spoke with pt per call stating onset of cough now interfering with her sleep. She states cough is productive of yellow phlegm " and every now and then some red strecks ".  She states she is using " Lunden's cough drops and some kind of Petra Kuba something my husband has " .  She denies any fevers.  Per above this RN recommended for the patient to take Robitussin DM or Muccinex DM ( stated to her to compare boxes and get the generic brand )- and to push fluids.  If symptoms worsen she should call and see her primary MD.  Melanie Holloway verbalized understanding.

## 2017-11-11 NOTE — Progress Notes (Signed)
  Echocardiogram 2D Echocardiogram has been performed.  Melanie Holloway 11/11/2017, 11:02 AM

## 2017-11-15 ENCOUNTER — Encounter: Payer: Self-pay | Admitting: Family Medicine

## 2017-11-15 ENCOUNTER — Ambulatory Visit: Payer: Self-pay | Admitting: *Deleted

## 2017-11-15 ENCOUNTER — Other Ambulatory Visit: Payer: Self-pay

## 2017-11-15 ENCOUNTER — Other Ambulatory Visit: Payer: Self-pay | Admitting: Oncology

## 2017-11-15 ENCOUNTER — Ambulatory Visit (INDEPENDENT_AMBULATORY_CARE_PROVIDER_SITE_OTHER): Payer: 59 | Admitting: Family Medicine

## 2017-11-15 ENCOUNTER — Other Ambulatory Visit: Payer: Self-pay | Admitting: *Deleted

## 2017-11-15 VITALS — BP 138/92 | HR 109 | Temp 98.5°F | Ht 59.0 in | Wt 152.5 lb

## 2017-11-15 DIAGNOSIS — I313 Pericardial effusion (noninflammatory): Secondary | ICD-10-CM

## 2017-11-15 DIAGNOSIS — C50512 Malignant neoplasm of lower-outer quadrant of left female breast: Secondary | ICD-10-CM

## 2017-11-15 DIAGNOSIS — C801 Malignant (primary) neoplasm, unspecified: Secondary | ICD-10-CM

## 2017-11-15 DIAGNOSIS — C7951 Secondary malignant neoplasm of bone: Secondary | ICD-10-CM | POA: Diagnosis not present

## 2017-11-15 DIAGNOSIS — J209 Acute bronchitis, unspecified: Secondary | ICD-10-CM

## 2017-11-15 DIAGNOSIS — I3131 Malignant pericardial effusion in diseases classified elsewhere: Secondary | ICD-10-CM

## 2017-11-15 DIAGNOSIS — I318 Other specified diseases of pericardium: Secondary | ICD-10-CM | POA: Diagnosis not present

## 2017-11-15 DIAGNOSIS — Z170 Estrogen receptor positive status [ER+]: Secondary | ICD-10-CM

## 2017-11-15 MED ORDER — DOXYCYCLINE HYCLATE 100 MG PO TABS: 100.0000 mg | ORAL_TABLET | Freq: Two times a day (BID) | ORAL | 0 refills | Status: DC
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
## 2017-11-15 NOTE — Telephone Encounter (Signed)
APPOINTMENT  MADE  TODAY  WITH  DR  COPLAND  AT  340  PM      3  WAY   CONFEREE  WITH  RENA  AT THE  OFFICE      PT  ADVISED  IF  DEVELOPED   ANY  INCREASE  IN   SYMPTOMS  TO   CALL 911  AND  GO  TO  OFFICE  PT  ADVISED  NOT TO DRIVE

## 2017-11-15 NOTE — Progress Notes (Unsigned)
Left message for patient regarding upcoming January appointments per 1/21 los. Patient is scheduled for early morning appointments per patient request.

## 2017-11-15 NOTE — Telephone Encounter (Signed)
When Melanie Holloway conferenced in pt she said she had prod cough with white phlegm and SOB upon exertion.she felt OK now and if condition worsened prior to appt today at 3:40 pt will go to ED if needed. FYI to Dr Lorelei Pont.

## 2017-11-15 NOTE — Telephone Encounter (Signed)
  Reason for Disposition . [1] MILD difficulty breathing (e.g., minimal/no SOB at rest, SOB with walking, pulse <100) AND [2] NEW-onset or WORSE than normal  Answer Assessment - Initial Assessment Questions 1. RESPIRATORY STATUS: "Describe your breathing?" (e.g., wheezing, shortness of breath, unable to speak, severe coughing)       SOME   WHEEZING   SHORT  ON  EXERTION   COUGHING UP  PHGLEM  2. ONSET: "When did this breathing problem begin?"       10  DAYS    3. PATTERN "Does the difficult breathing come and go, or has it been constant since it started?"        COMES   AND  GOES   ON  EXERTION   4. SEVERITY: "How bad is your breathing?" (e.g., mild, moderate, severe)    - MILD: No SOB at rest, mild SOB with walking, speaks normally in sentences, can lay down, no retractions, pulse < 100.    - MODERATE: SOB at rest, SOB with minimal exertion and prefers to sit, cannot lie down flat, speaks in phrases, mild retractions, audible wheezing, pulse 100-120.    - SEVERE: Very SOB at rest, speaks in single words, struggling to breathe, sitting hunched forward, retractions, pulse > 120      MOD 5. RECURRENT SYMPTOM: "Have you had difficulty breathing before?" If so, ask: "When was the last time?" and "What happened that time?"      NO 6. CARDIAC HISTORY: "Do you have any history of heart disease?" (e.g., heart attack, angina, bypass surgery, angioplasty)        NO 7. LUNG HISTORY: "Do you have any history of lung disease?"  (e.g., pulmonary embolus, asthma, emphysema)       NO 8. CAUSE: "What do you think is causing the breathing problem?"       ANEMIA    POSS  A   VIRUS    9. OTHER SYMPTOMS: "Do you have any other symptoms? (e.g., dizziness, runny nose, cough, chest pain, fever)       SLIGHT  BLOOD  IN   MUCOUS   COUGH   MAINLY   THIN      10. PREGNANCY: "Is there any chance you are pregnant?" "When was your last menstrual period?"       N/A 11. TRAVEL: "Have you traveled out of the country in the  last month?" (e.g., travel history, exposures)       *No Answer*  Protocols used: BREATHING DIFFICULTY-A-AH

## 2017-11-16 ENCOUNTER — Telehealth: Payer: Self-pay | Admitting: *Deleted

## 2017-11-16 ENCOUNTER — Other Ambulatory Visit: Payer: Self-pay | Admitting: *Deleted

## 2017-11-16 NOTE — Telephone Encounter (Signed)
This RN received message stating CT scheduled with prior CT 2 weeks ago - with request for clarification.  Post chart review - CT not indicated at this time but noted request for biospy of lymph nodes not scheduled.   Per contact with IR - note from [facility] states they have been unable to reach pt to schedule biopsy.  MD aware of above and is unable to cancel CT order due to presently scheduled.  This RN attempted to contact patient and obtained identified VM - messages left stating CT canceled but pt needs to contact central scheduling for additional request.  This RN then contacted Central Scheduling and was able to have CT appointment canceled.  This RN also left a message on IR scheduler - Toni per above.  This RN cancelled order for CT.  This note will be forwarded to MD for review of above communication. No further needs until contact received from pt.

## 2017-11-17 ENCOUNTER — Ambulatory Visit: Payer: Self-pay | Admitting: *Deleted

## 2017-11-17 ENCOUNTER — Ambulatory Visit: Payer: 59

## 2017-11-17 MED ORDER — ALBUTEROL SULFATE HFA 108 (90 BASE) MCG/ACT IN AERS: 2.0000 | INHALATION_SPRAY | RESPIRATORY_TRACT | 0 refills | Status: DC | PRN

## 2017-11-17 MED ORDER — PREDNISONE 20 MG PO TABS: ORAL_TABLET | ORAL | 0 refills | Status: DC

## 2017-11-17 NOTE — Telephone Encounter (Signed)
Ms. Valido notified by telephone that Dr. Lorelei Pont sent her in a Rx for prednisone as well as an Albuterol Inhaler to [facility] in [place].

## 2017-11-17 NOTE — Addendum Note (Signed)
Addended by: Carter Kitten on: 11/17/2017 03:17 PM   Modules accepted: Orders

## 2017-11-17 NOTE — Telephone Encounter (Signed)
Pt called complaining of "heavy breathing"; she was seen by Dr Lorelei Pont on 11/15/17 and given antibiotics; she says she feels like she is getting some better but her breathing is not; nurse triage initiated and recommendation made for pt to go t ED; she declines; pt asks "what about some albuterol and prednisone; spoke with Rollene Fare at [facility] regarding pt request; will route to office for final MD review; pt can be contacted at [phone].   Reason for Disposition . [1] MODERATE difficulty breathing (e.g., speaks in phrases, SOB even at rest, pulse 100-120) AND [2] NEW-onset or WORSE than normal  Answer Assessment - Initial Assessment Questions 1. RESPIRATORY STATUS: "Describe your breathing?" (e.g., wheezing, shortness of breath, unable to speak, severe coughing)      Heavy, coughing 2. ONSET: "When did this breathing problem begin?"     2 weeks ago 3. PATTERN "Does the difficult breathing come and go, or has it been constant since it started?"      Constant, can't lay down 4. SEVERITY: "How bad is your breathing?" (e.g., mild, moderate, severe)    - MILD: No SOB at rest, mild SOB with walking, speaks normally in sentences, can lay down, no retractions, pulse < 100.    - MODERATE: SOB at rest, SOB with minimal exertion and prefers to sit, cannot lie down flat, speaks in phrases, mild retractions, audible wheezing, pulse 100-120.    - SEVERE: Very SOB at rest, speaks in single words, struggling to breathe, sitting hunched forward, retractions, pulse > 120      moderate 5. RECURRENT SYMPTOM: "Have you had difficulty breathing before?" If so, ask: "When was the last time?" and "What happened that time?"      no 6. CARDIAC HISTORY: "Do you have any history of heart disease?" (e.g., heart attack, angina, bypass surgery, angioplasty)      no 7. LUNG HISTORY: "Do you have any history of lung disease?"  (e.g., pulmonary embolus, asthma, emphysema)     no 8. CAUSE: "What do you think is causing  the breathing problem?"      Related to what she saw Dr Lorelei Pont for on 11/15/17 9. OTHER SYMPTOMS: "Do you have any other symptoms? (e.g., dizziness, runny nose, cough, chest pain, fever)     Productive cough with brown blood tinged phlegm , runny nose 10. PREGNANCY: "Is there any chance you are pregnant?" "When was your last menstrual period?"     No no 11. TRAVEL: "Have you traveled out of the country in the last month?" (e.g., travel history, exposures)       no  Protocols used: BREATHING DIFFICULTY-A-AH

## 2017-11-17 NOTE — Telephone Encounter (Signed)
I think pred + alb is a reasonable idea  Prednisone 20 mg, 2 tabs po for 5 days, then 1 tab po for 5 days, #15  Albuterol HFA, 2 puffs every 4 hours as needed for wheezing or SOB, #1 inhaler

## 2017-11-17 NOTE — Progress Notes (Signed)
No show

## 2017-11-18 ENCOUNTER — Emergency Department: Payer: 59

## 2017-11-18 ENCOUNTER — Encounter: Payer: Self-pay | Admitting: *Deleted

## 2017-11-18 ENCOUNTER — Encounter: Payer: Self-pay | Admitting: Emergency Medicine

## 2017-11-18 ENCOUNTER — Inpatient Hospital Stay
Admission: EM | Admit: 2017-11-18 | Discharge: 2017-12-08 | DRG: 166 | Disposition: A | Discharge status: Home Health | Payer: 59 | Attending: Family Medicine | Admitting: Family Medicine

## 2017-11-18 ENCOUNTER — Other Ambulatory Visit: Payer: Self-pay

## 2017-11-18 ENCOUNTER — Ambulatory Visit (INDEPENDENT_AMBULATORY_CARE_PROVIDER_SITE_OTHER)
Admission: EM | Admit: 2017-11-18 | Discharge: 2017-11-18 | Disposition: A | Discharge status: Hospice | Payer: 59 | Source: Home / Self Care | Attending: Family Medicine | Admitting: Family Medicine

## 2017-11-18 DIAGNOSIS — I130 Hypertensive heart and chronic kidney disease with heart failure and stage 1 through stage 4 chronic kidney disease, or unspecified chronic kidney disease: Secondary | ICD-10-CM | POA: Diagnosis present

## 2017-11-18 DIAGNOSIS — Z9221 Personal history of antineoplastic chemotherapy: Secondary | ICD-10-CM

## 2017-11-18 DIAGNOSIS — R590 Localized enlarged lymph nodes: Secondary | ICD-10-CM | POA: Diagnosis not present

## 2017-11-18 DIAGNOSIS — C7951 Secondary malignant neoplasm of bone: Secondary | ICD-10-CM | POA: Diagnosis present

## 2017-11-18 DIAGNOSIS — I10 Essential (primary) hypertension: Secondary | ICD-10-CM | POA: Insufficient documentation

## 2017-11-18 DIAGNOSIS — R509 Fever, unspecified: Secondary | ICD-10-CM | POA: Diagnosis not present

## 2017-11-18 DIAGNOSIS — R402142 Coma scale, eyes open, spontaneous, at arrival to emergency department: Secondary | ICD-10-CM | POA: Diagnosis present

## 2017-11-18 DIAGNOSIS — J9690 Respiratory failure, unspecified, unspecified whether with hypoxia or hypercapnia: Secondary | ICD-10-CM | POA: Diagnosis not present

## 2017-11-18 DIAGNOSIS — B370 Candidal stomatitis: Secondary | ICD-10-CM | POA: Diagnosis not present

## 2017-11-18 DIAGNOSIS — Z9889 Other specified postprocedural states: Secondary | ICD-10-CM

## 2017-11-18 DIAGNOSIS — M3214 Glomerular disease in systemic lupus erythematosus: Secondary | ICD-10-CM | POA: Diagnosis present

## 2017-11-18 DIAGNOSIS — E1122 Type 2 diabetes mellitus with diabetic chronic kidney disease: Secondary | ICD-10-CM | POA: Diagnosis present

## 2017-11-18 DIAGNOSIS — J189 Pneumonia, unspecified organism: Secondary | ICD-10-CM | POA: Diagnosis not present

## 2017-11-18 DIAGNOSIS — Z79899 Other long term (current) drug therapy: Secondary | ICD-10-CM | POA: Insufficient documentation

## 2017-11-18 DIAGNOSIS — Z79811 Long term (current) use of aromatase inhibitors: Secondary | ICD-10-CM | POA: Insufficient documentation

## 2017-11-18 DIAGNOSIS — Z833 Family history of diabetes mellitus: Secondary | ICD-10-CM

## 2017-11-18 DIAGNOSIS — Z803 Family history of malignant neoplasm of breast: Secondary | ICD-10-CM

## 2017-11-18 DIAGNOSIS — J9601 Acute respiratory failure with hypoxia: Secondary | ICD-10-CM | POA: Diagnosis not present

## 2017-11-18 DIAGNOSIS — I129 Hypertensive chronic kidney disease with stage 1 through stage 4 chronic kidney disease, or unspecified chronic kidney disease: Secondary | ICD-10-CM | POA: Diagnosis not present

## 2017-11-18 DIAGNOSIS — Z923 Personal history of irradiation: Secondary | ICD-10-CM

## 2017-11-18 DIAGNOSIS — I509 Heart failure, unspecified: Secondary | ICD-10-CM | POA: Diagnosis not present

## 2017-11-18 DIAGNOSIS — I214 Non-ST elevation (NSTEMI) myocardial infarction: Secondary | ICD-10-CM | POA: Diagnosis not present

## 2017-11-18 DIAGNOSIS — I318 Other specified diseases of pericardium: Secondary | ICD-10-CM | POA: Diagnosis not present

## 2017-11-18 DIAGNOSIS — Z170 Estrogen receptor positive status [ER+]: Secondary | ICD-10-CM | POA: Diagnosis not present

## 2017-11-18 DIAGNOSIS — R599 Enlarged lymph nodes, unspecified: Secondary | ICD-10-CM | POA: Diagnosis not present

## 2017-11-18 DIAGNOSIS — R062 Wheezing: Secondary | ICD-10-CM

## 2017-11-18 DIAGNOSIS — I248 Other forms of acute ischemic heart disease: Secondary | ICD-10-CM | POA: Diagnosis present

## 2017-11-18 DIAGNOSIS — R0602 Shortness of breath: Secondary | ICD-10-CM

## 2017-11-18 DIAGNOSIS — Z9012 Acquired absence of left breast and nipple: Secondary | ICD-10-CM | POA: Insufficient documentation

## 2017-11-18 DIAGNOSIS — N179 Acute kidney failure, unspecified: Secondary | ICD-10-CM

## 2017-11-18 DIAGNOSIS — R197 Diarrhea, unspecified: Secondary | ICD-10-CM | POA: Diagnosis not present

## 2017-11-18 DIAGNOSIS — D561 Beta thalassemia: Secondary | ICD-10-CM

## 2017-11-18 DIAGNOSIS — I5043 Acute on chronic combined systolic (congestive) and diastolic (congestive) heart failure: Secondary | ICD-10-CM | POA: Diagnosis not present

## 2017-11-18 DIAGNOSIS — R402252 Coma scale, best verbal response, oriented, at arrival to emergency department: Secondary | ICD-10-CM | POA: Diagnosis present

## 2017-11-18 DIAGNOSIS — R05 Cough: Secondary | ICD-10-CM

## 2017-11-18 DIAGNOSIS — E559 Vitamin D deficiency, unspecified: Secondary | ICD-10-CM

## 2017-11-18 DIAGNOSIS — Z853 Personal history of malignant neoplasm of breast: Secondary | ICD-10-CM

## 2017-11-18 DIAGNOSIS — J810 Acute pulmonary edema: Secondary | ICD-10-CM | POA: Diagnosis not present

## 2017-11-18 DIAGNOSIS — N183 Chronic kidney disease, stage 3 unspecified: Secondary | ICD-10-CM

## 2017-11-18 DIAGNOSIS — R319 Hematuria, unspecified: Secondary | ICD-10-CM | POA: Diagnosis not present

## 2017-11-18 DIAGNOSIS — C801 Malignant (primary) neoplasm, unspecified: Secondary | ICD-10-CM | POA: Diagnosis not present

## 2017-11-18 DIAGNOSIS — I313 Pericardial effusion (noninflammatory): Secondary | ICD-10-CM | POA: Diagnosis not present

## 2017-11-18 DIAGNOSIS — M329 Systemic lupus erythematosus, unspecified: Secondary | ICD-10-CM | POA: Diagnosis not present

## 2017-11-18 DIAGNOSIS — D61818 Other pancytopenia: Secondary | ICD-10-CM | POA: Diagnosis present

## 2017-11-18 DIAGNOSIS — J9621 Acute and chronic respiratory failure with hypoxia: Secondary | ICD-10-CM | POA: Diagnosis not present

## 2017-11-18 DIAGNOSIS — R402362 Coma scale, best motor response, obeys commands, at arrival to emergency department: Secondary | ICD-10-CM | POA: Diagnosis present

## 2017-11-18 DIAGNOSIS — Y95 Nosocomial condition: Secondary | ICD-10-CM | POA: Diagnosis not present

## 2017-11-18 DIAGNOSIS — T380X5A Adverse effect of glucocorticoids and synthetic analogues, initial encounter: Secondary | ICD-10-CM | POA: Diagnosis present

## 2017-11-18 DIAGNOSIS — R0989 Other specified symptoms and signs involving the circulatory and respiratory systems: Secondary | ICD-10-CM | POA: Diagnosis not present

## 2017-11-18 DIAGNOSIS — R918 Other nonspecific abnormal finding of lung field: Secondary | ICD-10-CM | POA: Diagnosis not present

## 2017-11-18 DIAGNOSIS — Z6830 Body mass index (BMI) 30.0-30.9, adult: Secondary | ICD-10-CM | POA: Diagnosis not present

## 2017-11-18 DIAGNOSIS — D696 Thrombocytopenia, unspecified: Secondary | ICD-10-CM | POA: Diagnosis not present

## 2017-11-18 DIAGNOSIS — C771 Secondary and unspecified malignant neoplasm of intrathoracic lymph nodes: Secondary | ICD-10-CM | POA: Diagnosis present

## 2017-11-18 DIAGNOSIS — A419 Sepsis, unspecified organism: Secondary | ICD-10-CM | POA: Diagnosis not present

## 2017-11-18 DIAGNOSIS — N289 Disorder of kidney and ureter, unspecified: Secondary | ICD-10-CM | POA: Diagnosis not present

## 2017-11-18 DIAGNOSIS — M3210 Systemic lupus erythematosus, organ or system involvement unspecified: Secondary | ICD-10-CM | POA: Diagnosis not present

## 2017-11-18 DIAGNOSIS — C50919 Malignant neoplasm of unspecified site of unspecified female breast: Secondary | ICD-10-CM | POA: Diagnosis not present

## 2017-11-18 DIAGNOSIS — R042 Hemoptysis: Secondary | ICD-10-CM | POA: Diagnosis not present

## 2017-11-18 DIAGNOSIS — I2510 Atherosclerotic heart disease of native coronary artery without angina pectoris: Secondary | ICD-10-CM | POA: Diagnosis present

## 2017-11-18 DIAGNOSIS — N19 Unspecified kidney failure: Secondary | ICD-10-CM | POA: Diagnosis not present

## 2017-11-18 DIAGNOSIS — J9600 Acute respiratory failure, unspecified whether with hypoxia or hypercapnia: Secondary | ICD-10-CM

## 2017-11-18 DIAGNOSIS — N022 Recurrent and persistent hematuria with diffuse membranous glomerulonephritis: Secondary | ICD-10-CM | POA: Diagnosis not present

## 2017-11-18 DIAGNOSIS — R809 Proteinuria, unspecified: Secondary | ICD-10-CM | POA: Diagnosis not present

## 2017-11-18 DIAGNOSIS — E669 Obesity, unspecified: Secondary | ICD-10-CM

## 2017-11-18 DIAGNOSIS — J90 Pleural effusion, not elsewhere classified: Secondary | ICD-10-CM | POA: Diagnosis not present

## 2017-11-18 DIAGNOSIS — M069 Rheumatoid arthritis, unspecified: Secondary | ICD-10-CM | POA: Diagnosis present

## 2017-11-18 DIAGNOSIS — I5021 Acute systolic (congestive) heart failure: Secondary | ICD-10-CM | POA: Diagnosis not present

## 2017-11-18 LAB — CBC
HCT: 30.9 % — ABNORMAL LOW (ref 35.0–47.0)
Hemoglobin: 10.6 g/dL — ABNORMAL LOW (ref 12.0–16.0)
MCH: 31.4 pg (ref 26.0–34.0)
MCHC: 34.4 g/dL (ref 32.0–36.0)
MCV: 91.4 fL (ref 80.0–100.0)
PLATELETS: 111 10*3/uL — AB (ref 150–440)
RBC: 3.38 MIL/uL — ABNORMAL LOW (ref 3.80–5.20)
RDW: 16.3 % — AB (ref 11.5–14.5)
WBC: 3.9 10*3/uL (ref 3.6–11.0)

## 2017-11-18 LAB — BASIC METABOLIC PANEL
Anion gap: 8 (ref 5–15)
BUN: 29 mg/dL — AB (ref 6–20)
CHLORIDE: 108 mmol/L (ref 101–111)
CO2: 19 mmol/L — AB (ref 22–32)
CREATININE: 1.12 mg/dL — AB (ref 0.44–1.00)
Calcium: 8.8 mg/dL — ABNORMAL LOW (ref 8.9–10.3)
GFR calc Af Amer: >60 mL/min (ref >60)
GFR calc non Af Amer: 55 mL/min — ABNORMAL LOW (ref >60)
GLUCOSE: 97 mg/dL (ref 65–99)
Potassium: 3.8 mmol/L (ref 3.5–5.1)
Sodium: 135 mmol/L (ref 135–145)

## 2017-11-18 LAB — MRSA PCR SCREENING: MRSA by PCR: NEGATIVE

## 2017-11-18 LAB — TROPONIN I: Troponin I: 0.1 ng/mL (ref <0.03)

## 2017-11-18 LAB — INFLUENZA PANEL BY PCR (TYPE A & B)
INFLAPCR: NEGATIVE
INFLBPCR: NEGATIVE

## 2017-11-18 LAB — BRAIN NATRIURETIC PEPTIDE: B Natriuretic Peptide: 2975 pg/mL — ABNORMAL HIGH (ref 0.0–100.0)

## 2017-11-18 MED ORDER — SODIUM CHLORIDE 0.9 % IV SOLN: 250.0000 mL | INTRAVENOUS | Status: DC | PRN

## 2017-11-18 MED ORDER — ASPIRIN EC 81 MG PO TBEC
81.0000 mg | DELAYED_RELEASE_TABLET | Freq: Every day | ORAL | Status: DC
  Administered 2017-11-19 – 2017-11-20 (×2): 81 mg via ORAL
  Filled 2017-11-18 (×2): qty 1

## 2017-11-18 MED ORDER — SODIUM CHLORIDE 0.9% FLUSH
3.0000 mL | Freq: Two times a day (BID) | INTRAVENOUS | Status: DC
  Administered 2017-11-19 – 2017-12-08 (×36): 3 mL via INTRAVENOUS

## 2017-11-18 MED ORDER — SODIUM CHLORIDE 0.9 % IV BOLUS (SEPSIS)
1000.0000 mL | Freq: Once | INTRAVENOUS | Status: AC
  Administered 2017-11-18: 1000 mL via INTRAVENOUS

## 2017-11-18 MED ORDER — ALPRAZOLAM 0.25 MG PO TABS
0.2500 mg | ORAL_TABLET | Freq: Two times a day (BID) | ORAL | Status: DC | PRN
  Administered 2017-11-29 – 2017-12-06 (×3): 0.25 mg via ORAL
  Filled 2017-11-18 (×3): qty 1

## 2017-11-18 MED ORDER — LOSARTAN POTASSIUM 50 MG PO TABS: 25.0000 mg | ORAL_TABLET | Freq: Every day | ORAL | Status: DC

## 2017-11-18 MED ORDER — VANCOMYCIN HCL IN DEXTROSE 1-5 GM/200ML-% IV SOLN
1000.0000 mg | Freq: Once | INTRAVENOUS | Status: AC
  Administered 2017-11-18: 1000 mg via INTRAVENOUS
  Filled 2017-11-18: qty 200

## 2017-11-18 MED ORDER — IPRATROPIUM-ALBUTEROL 0.5-2.5 (3) MG/3ML IN SOLN
3.0000 mL | Freq: Once | RESPIRATORY_TRACT | Status: AC
  Administered 2017-11-18: 3 mL via RESPIRATORY_TRACT
  Filled 2017-11-18: qty 3

## 2017-11-18 MED ORDER — FUROSEMIDE 10 MG/ML IJ SOLN
40.0000 mg | Freq: Two times a day (BID) | INTRAMUSCULAR | Status: DC
  Administered 2017-11-19 – 2017-11-20 (×3): 40 mg via INTRAVENOUS
  Filled 2017-11-18 (×3): qty 4

## 2017-11-18 MED ORDER — CARVEDILOL 12.5 MG PO TABS
12.5000 mg | ORAL_TABLET | Freq: Two times a day (BID) | ORAL | Status: DC
  Administered 2017-11-18 – 2017-11-25 (×14): 12.5 mg via ORAL
  Filled 2017-11-18 (×14): qty 1

## 2017-11-18 MED ORDER — CLONIDINE HCL 0.2 MG/24HR TD PTWK
0.2000 mg | MEDICATED_PATCH | TRANSDERMAL | Status: DC
  Filled 2017-11-18: qty 1

## 2017-11-18 MED ORDER — SODIUM CHLORIDE 0.9% FLUSH
3.0000 mL | INTRAVENOUS | Status: DC | PRN
  Administered 2017-11-25: 3 mL via INTRAVENOUS
  Filled 2017-11-18: qty 3

## 2017-11-18 MED ORDER — ONDANSETRON HCL 4 MG/2ML IJ SOLN
4.0000 mg | Freq: Four times a day (QID) | INTRAMUSCULAR | Status: DC | PRN
  Administered 2017-11-19 – 2017-12-01 (×4): 4 mg via INTRAVENOUS
  Filled 2017-11-18 (×4): qty 2

## 2017-11-18 MED ORDER — FUROSEMIDE 10 MG/ML IJ SOLN
40.0000 mg | Freq: Once | INTRAMUSCULAR | Status: AC
  Administered 2017-11-18: 40 mg via INTRAVENOUS
  Filled 2017-11-18: qty 4

## 2017-11-18 MED ORDER — ACETAMINOPHEN 325 MG PO TABS
650.0000 mg | ORAL_TABLET | ORAL | Status: DC | PRN
Start: 2017-11-18 — End: 2017-12-08
  Administered 2017-11-20 – 2017-12-04 (×16): 650 mg via ORAL
  Filled 2017-11-18 (×16): qty 2

## 2017-11-18 MED ORDER — IOPAMIDOL (ISOVUE-370) INJECTION 76%
75.0000 mL | Freq: Once | INTRAVENOUS | Status: AC | PRN
  Administered 2017-11-18: 75 mL via INTRAVENOUS

## 2017-11-18 MED ORDER — HYDROCHLOROTHIAZIDE 12.5 MG PO CAPS
12.5000 mg | ORAL_CAPSULE | Freq: Every day | ORAL | Status: DC
  Administered 2017-11-19: 12.5 mg via ORAL
  Filled 2017-11-18 (×2): qty 1

## 2017-11-18 MED ORDER — ALBUTEROL SULFATE (2.5 MG/3ML) 0.083% IN NEBU: 2.5000 mg | INHALATION_SOLUTION | RESPIRATORY_TRACT | Status: DC | PRN

## 2017-11-18 MED ORDER — IPRATROPIUM-ALBUTEROL 0.5-2.5 (3) MG/3ML IN SOLN
3.0000 mL | Freq: Once | RESPIRATORY_TRACT | Status: AC
  Administered 2017-11-18: 3 mL via RESPIRATORY_TRACT

## 2017-11-18 MED ORDER — HYDROCHLOROTHIAZIDE 12.5 MG PO CAPS: 12.5000 mg | ORAL_CAPSULE | Freq: Every day | ORAL | Status: DC

## 2017-11-18 MED ORDER — ENSURE ENLIVE PO LIQD
237.0000 mL | Freq: Three times a day (TID) | ORAL | Status: DC
  Administered 2017-11-19: 237 mL via ORAL

## 2017-11-18 MED ORDER — LETROZOLE 2.5 MG PO TABS
2.5000 mg | ORAL_TABLET | Freq: Every day | ORAL | Status: DC
  Administered 2017-11-19 – 2017-12-08 (×20): 2.5 mg via ORAL
  Filled 2017-11-18 (×20): qty 1

## 2017-11-18 MED ORDER — HYDRALAZINE HCL 20 MG/ML IJ SOLN
10.0000 mg | INTRAMUSCULAR | Status: DC | PRN
  Administered 2017-11-19: 10 mg via INTRAVENOUS
  Filled 2017-11-18: qty 1

## 2017-11-18 MED ORDER — DEXTROSE 5 % IV SOLN
1.0000 g | Freq: Once | INTRAVENOUS | Status: AC
  Administered 2017-11-18: 1 g via INTRAVENOUS
  Filled 2017-11-18: qty 1

## 2017-11-18 MED ORDER — LOSARTAN POTASSIUM-HCTZ 50-12.5 MG PO TABS: 1.0000 | ORAL_TABLET | Freq: Every day | ORAL | Status: DC

## 2017-11-18 MED ORDER — TRIAMCINOLONE ACETONIDE 0.1 % EX CREA
1.0000 "application " | TOPICAL_CREAM | Freq: Two times a day (BID) | CUTANEOUS | Status: DC | PRN
  Administered 2017-12-04: 1 via TOPICAL
  Filled 2017-11-18 (×4): qty 15

## 2017-11-18 MED ORDER — CLONIDINE HCL 0.2 MG/24HR TD PTWK: 0.2000 mg | MEDICATED_PATCH | TRANSDERMAL | Status: DC

## 2017-11-18 MED ORDER — FUROSEMIDE 10 MG/ML IJ SOLN
20.0000 mg | Freq: Once | INTRAMUSCULAR | Status: AC
  Administered 2017-11-18: 20 mg via INTRAVENOUS
  Filled 2017-11-18: qty 4

## 2017-11-18 MED ORDER — HEPARIN SODIUM (PORCINE) 5000 UNIT/ML IJ SOLN
5000.0000 [IU] | Freq: Three times a day (TID) | INTRAMUSCULAR | Status: DC
Start: 2017-11-18 — End: 2017-11-19
  Administered 2017-11-18 – 2017-11-19 (×2): 5000 [IU] via SUBCUTANEOUS
  Filled 2017-11-18 (×2): qty 1

## 2017-11-18 MED ORDER — LOSARTAN POTASSIUM 50 MG PO TABS: 50.0000 mg | ORAL_TABLET | Freq: Every day | ORAL | Status: DC

## 2017-11-18 MED ORDER — VITAMIN D 1000 UNITS PO TABS
2000.0000 [IU] | ORAL_TABLET | Freq: Every day | ORAL | Status: DC
  Administered 2017-11-19 – 2017-12-08 (×20): 2000 [IU] via ORAL
  Filled 2017-11-18 (×20): qty 2

## 2017-11-18 MED ORDER — ASPIRIN 81 MG PO CHEW
324.0000 mg | CHEWABLE_TABLET | Freq: Once | ORAL | Status: AC
  Administered 2017-11-18: 324 mg via ORAL
  Filled 2017-11-18: qty 4

## 2017-11-18 MED ORDER — LOSARTAN POTASSIUM 50 MG PO TABS
50.0000 mg | ORAL_TABLET | Freq: Every day | ORAL | Status: DC
  Administered 2017-11-19 – 2017-11-20 (×2): 50 mg via ORAL
  Filled 2017-11-18 (×2): qty 1
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
## 2017-11-18 NOTE — ED Notes (Signed)
Called to give report to floor nurse. RN will call back after med pass.

## 2017-11-18 NOTE — Progress Notes (Signed)
eLink Physician-Brief Progress Note Patient Name: Melanie Holloway DOB: 1966/04/02 MRN: [id number]   Date of Service  11/18/2017  HPI/Events of Note  59 F with MMP including CHF who presented to [facility] with decompensated CHF.  Given lasix and placed on BiPAP.  Admitted to SDU  On camera check the paitent is alert with GCS of 15.  She is now off BiPAP with RR of 30, sats of 98% , HR of 113 and BP of 153/124.  She is in NAD.  eICU Interventions  Plan of care per primary admitting team Continue with lasix BID Monitor resp status and use BiPAP as needed PCCM has seen at bedside Continue to monitor via [facility]     Intervention Category Evaluation Type: New Patient Evaluation  Melanie Holloway 11/18/2017, 10:56 PM
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
## 2017-11-18 NOTE — ED Notes (Signed)
Patient transported to X-ray 

## 2017-11-18 NOTE — ED Triage Notes (Addendum)
Pt to ED via EMS from [facility] with c/o SOB , pt recently diagnosed with bronchitis x3days and has been on doxycycline with no improvement. Pt has breast cancer with possible mets. Pt A&Ox4, VS stable, RR even and unlabored.  Pt received duonebs at [facility]

## 2017-11-18 NOTE — Consult Note (Signed)
Name: Melanie Holloway MRN: [id number] DOB: [birth year]    ADMISSION DATE:  11/18/2017 CONSULTATION DATE: 11/18/2017  REFERRING MD : Dr. Jerelyn Charles   CHIEF COMPLAINT: Shortness of Breath   BRIEF PATIENT DESCRIPTION:  52 yo female admitted with acute on chronic hypoxic respiratory failure secondary to CHF exacerbation requiring Bipap   SIGNIFICANT EVENTS  01/31-Pt admitted to stepdown unit   STUDIES:  CT Angio Chest 01/31>>No CT evidence for acute pulmonary embolus. Similar appearance of pericardial effusion with mediastinal, hilar, right axillary/subpectoral lymphadenopathy. Lymphadenopathy in the right axilla and subpectoral region appears minimally progressed in the interval. Progression of interstitial and airspace disease in both lungs with a central predominance. Infectious/inflammatory etiology possible although pulmonary edema or even pulmonary hemorrhage could have this appearance. New small right pleural effusion with persistent tiny left effusion. Cholelithiasis. Bone metastases, similar to prior.  HISTORY OF PRESENT ILLNESS:   This is a 52 yo female with a PMH of Vitamin D Deficiency, Proteinuria, HTN, Left Breat Cancer s/p Mastectomy with Metastases  (dx 12/2007 estrogen receptor positive), Malignant Pericardial Effusion, Interstitial Pneumonitis, Splenic Mass, Beta Thalassemia, Anemia, Obesity, and Lupus Erythematosus Tumidus.  She presented to [facility] ER 01/31 with c/o shortness of breath onset of symptoms 3 days prior to presentation.  Per ER notes the pt went to urgent care 01/28 due to symptoms and was diagnosed with bronchitis.  She was prescribed doxycycline, albuterol, and prednisone, however she never got the prednisone filled.  Due to worsening shortness of breath she went back to urgent care today 01/31, and upon arrival EMS was notified and pt transported to the ER.  She received 2 duonebs en route to the ER with improvement of symptoms.  In the ER due to persistent respiratory  failure she was placed on BIpap.  CXR revealed pulmonary edema and CT Angio Chest negative for PE.  She was subsequently admitted to the stepdown unit by hospitalist team for further workup and treatment.   PAST MEDICAL HISTORY :   has a past medical history of Abnormal Pap smear (~2005), Anemia, Breast cancer, left ([place]) (12/2007), Full dentures, Hypertension, Obesity, Proteinuria (11/28/2015), and Vitamin D deficiency.  has a past surgical history that includes Mastectomy (2009); Ankle surgery (1987); and Tubal ligation (2000). Prior to Admission medications   Medication Sig Start Date End Date Taking? Authorizing Provider  Cholecalciferol (VITAMIN D) 2000 UNITS CAPS Take 1 capsule (2,000 Units total) by mouth daily. 09/29/12  Yes Ria Bush, MD  cloNIDine (CATAPRES - DOSED IN MG/24 HR) 0.2 mg/24hr patch PLACE 1 PATCH (0.2 MG TOTAL) ONTO THE SKIN ONCE A WEEK. 10/18/17  Yes Ria Bush, MD  doxycycline (VIBRA-TABS) 100 MG tablet Take 1 tablet (100 mg total) by mouth 2 (two) times daily for 10 days. 11/15/17 11/25/17 Yes Copland, Frederico Hamman, MD  letrozole [facility]) 2.5 MG tablet Take 2.5 mg by mouth daily. 09/11/17  Yes [provider]  losartan-hydrochlorothiazide (HYZAAR) 50-12.5 MG tablet TAKE 1 TABLET BY MOUTH DAILY. 09/02/17  Yes Ria Bush, MD  albuterol (PROVENTIL HFA;VENTOLIN HFA) 108 (90 Base) MCG/ACT inhaler Inhale 2 puffs into the lungs every 4 (four) hours as needed for wheezing or shortness of breath. 11/17/17   Copland, Frederico Hamman, MD  feeding supplement, ENSURE ENLIVE, (ENSURE ENLIVE) LIQD Take 237 mLs by mouth 2 (two) times daily between meals. Patient taking differently: Take 237 mLs by mouth as needed.  09/22/17   Epifanio Lesches, MD  triamcinolone cream (KENALOG) 0.1 % Apply 1 application topically 2 (two) times daily. Apply  to [place]. 03/29/17 03/29/18  Ria Bush, MD   No Known Allergies  FAMILY HISTORY:  family history includes Cancer in her cousin and  paternal grandmother; Diabetes in her father. SOCIAL HISTORY:  reports that  has never smoked. she has never used smokeless tobacco. She reports that she does not drink alcohol or use drugs.  REVIEW OF SYSTEMS: Positives in BOLD  Constitutional: Negative for fever, chills, weight loss, malaise/fatigue and diaphoresis.  HENT: Negative for hearing loss, ear pain, nosebleeds, congestion, sore throat, neck pain, tinnitus and ear discharge.   Eyes: Negative for blurred vision, double vision, photophobia, pain, discharge and redness.  Respiratory: cough, hemoptysis, sputum production, shortness of breath, wheezing and stridor.   Cardiovascular: Negative for chest pain, palpitations, orthopnea, claudication, leg swelling and PND.  Gastrointestinal: Negative for heartburn, nausea, vomiting, abdominal pain, diarrhea, constipation, blood in stool and melena.  Genitourinary: Negative for dysuria, urgency, frequency, hematuria and flank pain.  Musculoskeletal: Negative for myalgias, back pain, joint pain and falls.  Skin: Negative for itching and rash.  Neurological: Negative for dizziness, tingling, tremors, sensory change, speech change, focal weakness, seizures, loss of consciousness, weakness and headaches.  Endo/Heme/Allergies: Negative for environmental allergies and polydipsia. Does not bruise/bleed easily.  SUBJECTIVE:  Pt states breathing has improved   VITAL SIGNS: Temp:  [98.1 F (36.7 C)-98.3 F (36.8 C)] 98.3 F (36.8 C) (01/31 1709) Pulse Rate:  [116-133] 116 (01/31 1941) Resp:  [18-53] 30 (01/31 1930) BP: (156-193)/(108-143) 172/123 (01/31 1941) SpO2:  [93 %-100 %] 100 % (01/31 1941) Weight:  [68.9 kg (152 lb)] 68.9 kg (152 lb) (01/31 1707)  PHYSICAL EXAMINATION: General: acutely ill appearing female, NAD on Bipap  Neuro: lethargic, follows commands, PERRL  HEENT: supple, no JVD Cardiovascular: sinus tach, no R/G Lungs: faint crackles throughout, slightly tachypneic  Abdomen:  +BS x4, soft, non tender, non distended  Musculoskeletal: 2+ bilateral lower extremity edema  Skin: intact no rashes or lesions   Recent Labs  Lab 11/18/17 1715  NA 135  K 3.8  CL 108  CO2 19*  BUN 29*  CREATININE 1.12*  GLUCOSE 97   Recent Labs  Lab 11/18/17 1715  HGB 10.6*  HCT 30.9*  WBC 3.9  PLT 111*   Dg Chest 2 View  Result Date: 11/18/2017 CLINICAL DATA:  Shortness of Breath EXAM: CHEST  2 VIEW COMPARISON:  CT 11/04/2017 FINDINGS: Cardiomegaly. Vascular congestion. Bilateral interstitial prominence with lower lobe airspace opacities concerning for edema/CHF effusions suspected. No acute bony abnormality. IMPRESSION: Cardiomegaly with vascular congestion, interstitial and alveolar opacities most compatible with edema/CH F. Small effusions. Electronically Signed   By: Rolm Baptise M.D.   On: 11/18/2017 18:03   Ct Angio Chest Pe W And/or Wo Contrast  Result Date: 11/18/2017 CLINICAL DATA:  Breast cancer with shortness of breath. EXAM: CT ANGIOGRAPHY CHEST WITH CONTRAST TECHNIQUE: Multidetector CT imaging of the chest was performed using the standard protocol during bolus administration of intravenous contrast. Multiplanar CT image reconstructions and MIPs were obtained to evaluate the vascular anatomy. CONTRAST:  45mL ISOVUE-370 IOPAMIDOL (ISOVUE-370) INJECTION 76% COMPARISON:  11/04/2017 FINDINGS: Cardiovascular: Heart is enlarged. Similar pericardial effusion to prior study. No filling defect in the opacified pulmonary arteries to suggest the presence of an acute pulmonary embolus. Mediastinum/Nodes: Similar mediastinal lymphadenopathy. 12 mm short axis prevascular lymph node identified. Hilar lymphadenopathy persists. Progression of axillary and right subpectoral lymph nodes noted. Index 13 mm short axis right axillary lymph node is seen on image 30. 14 mm short  axis subpectoral lymph node is seen on image 15, increased from 12 mm previously. Lungs/Pleura: Interval development of  small right pleural effusion with persistent tiny left pleural effusion. There is diffuse interstitial and central ground-glass attenuation in both lungs, becoming more confluent in the lower lobes bilaterally. Subpleural opacity in the anterior left upper lobe likely related to radiation. Upper Abdomen: Calcified gallstones incompletely visualized. Musculoskeletal: Bony metastases again noted in the thoracic spine and sternum Review of the MIP images confirms the above findings. IMPRESSION: 1. No CT evidence for acute pulmonary embolus. 2. Similar appearance of pericardial effusion with mediastinal, hilar, right axillary/subpectoral lymphadenopathy. Lymphadenopathy in the right axilla and subpectoral region appears minimally progressed in the interval. 3. Progression of interstitial and airspace disease in both lungs with a central predominance. Infectious/inflammatory etiology possible although pulmonary edema or even pulmonary hemorrhage could have this appearance. 4. New small right pleural effusion with persistent tiny left effusion. 5. Cholelithiasis. 6. Bone metastases, similar to prior. Electronically Signed   By: Misty Stanley M.D.   On: 11/18/2017 19:49    ASSESSMENT / PLAN: Acute on chronic hypoxic respiratory failure secondary to CHF exacerbation  Acute renal failure Mildly elevated troponin's likely secondary to demand ischemia in the setting of respiratory failure Anemia without obvious acute blood loss Hx: Left Breast Cancer with Metastases, Lupus Erythematosus Tumidus, and Malignant Pericardial Effusion   P: Prn Bipap for dyspnea and/or hypoxia  Repeat CXR in am  Prn bronchodilator therapy  Continue iv lasix  Continue outpatient cardiac medications  Continuous telemetry monitoring  Trend troponin's Trend BMP Replace electrolytes as indicated  Monitor UOP Trend WBC and monitor fever curve  Will rule out influenza  Subq heparin for VTE prophylaxis  Trend CBC and monitor for s/sx  of bleeding Transfuse for hgb <7 Continue letrozole  Marda Stalker, [place] Pager [phone] (please enter 7 digits) PCCM Consult Pager [phone] (please enter 7 digits)

## 2017-11-18 NOTE — ED Notes (Addendum)
Pt tachpnic at 76 and diaphoretic, MD called to bedside. RT called at this time for bipap
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
## 2017-11-19 ENCOUNTER — Ambulatory Visit: Payer: 59 | Admitting: Oncology

## 2017-11-19 ENCOUNTER — Inpatient Hospital Stay: Payer: 59

## 2017-11-19 ENCOUNTER — Other Ambulatory Visit: Payer: Self-pay

## 2017-11-19 ENCOUNTER — Encounter: Payer: Self-pay | Admitting: Oncology

## 2017-11-19 ENCOUNTER — Ambulatory Visit: Payer: Self-pay | Admitting: Rheumatology

## 2017-11-19 LAB — BASIC METABOLIC PANEL
Anion gap: 12 (ref 5–15)
BUN: 33 mg/dL — AB (ref 6–20)
CALCIUM: 8.7 mg/dL — AB (ref 8.9–10.3)
CHLORIDE: 105 mmol/L (ref 101–111)
CO2: 19 mmol/L — AB (ref 22–32)
CREATININE: 1.23 mg/dL — AB (ref 0.44–1.00)
GFR calc Af Amer: 57 mL/min — ABNORMAL LOW (ref >60)
GFR calc non Af Amer: 49 mL/min — ABNORMAL LOW (ref >60)
GLUCOSE: 119 mg/dL — AB (ref 65–99)
Potassium: 3.9 mmol/L (ref 3.5–5.1)
Sodium: 136 mmol/L (ref 135–145)

## 2017-11-19 LAB — GLUCOSE, CAPILLARY
Glucose-Capillary: 126 mg/dL — ABNORMAL HIGH (ref 65–99)
Glucose-Capillary: 96 mg/dL (ref 65–99)

## 2017-11-19 LAB — TROPONIN I
TROPONIN I: 0.09 ng/mL — AB (ref <0.03)
Troponin I: 0.06 ng/mL (ref <0.03)
Troponin I: 0.12 ng/mL (ref <0.03)

## 2017-11-19 NOTE — Progress Notes (Signed)
Initial Nutrition Assessment  DOCUMENTATION CODES:   Not applicable  INTERVENTION:   1. Continue Ensure Enlive po TID, each supplement provides 350 kcal and 20 grams of protein  NUTRITION DIAGNOSIS:   Inadequate oral intake related to nausea, vomiting, poor appetite as evidenced by per patient/family report.  GOAL:   Patient will meet greater than or equal to 90% of their needs  MONITOR:   PO intake, I & O's, Labs, Supplement acceptance, Weight trends  REASON FOR ASSESSMENT:   Malnutrition Screening Tool, Consult Diet education  ASSESSMENT:   [name] is a 52 y.o. female  with lupus, beta thalassemia,  hypertension, anemia, vitamin D deficiency, breast cancer status post left mastectomy, chemotherapy and radiation, possible tumor recurrence, who presents with acute hypoxic respiratory failure, newly diagnosed systolic congestive heart failure exacerbation.  Patient was actively vomiting during visit. Spoke with her friend outside room briefly. According to her patient was sick back in November and had decreased appetite then along with weight loss. Per previous RD note, patient's appetite was getting back to normal when admitted 09/22/2017.  Per chart review, weight is down 10 pounds in 3 days. Discussed at rounds, she ate well for breakfast. RD will follow up.  Labs reviewed:  BNP 2975  Medications reviewed and include:  Vitamin D Femara   NUTRITION - FOCUSED PHYSICAL EXAM:    Most Recent Value  Orbital Region  Unable to assess  Upper Arm Region  Unable to assess  Thoracic and Lumbar Region  Unable to assess  Buccal Region  Unable to assess  Temple Region  Unable to assess  Clavicle Bone Region  Unable to assess  Clavicle and Acromion Bone Region  Unable to assess  Scapular Bone Region  Unable to assess  Dorsal Hand  Unable to assess  Patellar Region  Unable to assess  Anterior Thigh Region  Unable to assess  Posterior Calf Region  Unable to assess   Edema (RD Assessment)  Unable to assess  Hair  Unable to assess  Eyes  Unable to assess  Mouth  Unable to assess  Skin  Unable to assess  Nails  Unable to assess       Diet Order:  Diet heart healthy/carb modified Room service appropriate? Yes; Fluid consistency: Thin  EDUCATION NEEDS:   Not appropriate for education at this time  Skin:  Skin Assessment: Reviewed RN Assessment  Last BM:  11/18/2017  Height:   Ht Readings from Last 1 Encounters:  11/18/17 4\' 11"  (1.499 m)    Weight:   Wt Readings from Last 1 Encounters:  11/18/17 142 lb 10.2 oz (64.7 kg)    Ideal Body Weight:  44.7 kg  BMI:  Body mass index is 28.81 kg/m.  Estimated Nutritional Needs:   Kcal:  1800-2100 calories  Protein:  84-110 grams  Fluid:  Per MD in setting of fluid overload   Satira Anis. Jordell Outten, MS, RD LDN Inpatient Clinical Dietitian Pager [phone]

## 2017-11-19 NOTE — Progress Notes (Signed)
Chaplain responded to a consult request. Pt wanted prayer. Chaplain sat next to bed and probed to start a conversation. Ch noticed Pt seemed tired and she confirmed. Ch prayed a healing prayer and departed.    11/19/17 1000  Clinical Encounter Type  Visited With Patient  Visit Type Initial;Spiritual support  Referral From Nurse  Spiritual Encounters  Spiritual Needs Prayer

## 2017-11-19 NOTE — Progress Notes (Signed)
[facility]  ROUNDING NOTE   Subjective:   Ms. Deztiny Sarra admitted to [facility] on 11/18/2017 for Acute pulmonary edema ([place]) [J81.0] NSTEMI (non-ST elevated myocardial infarction) ([place]) [I21.4] Acute respiratory failure with hypoxia ([facility]) [J96.01] Obesity, Class I, BMI 30.0-34.9 (see actual BMI) [E66.9]  Placed on BIPAP. IV furosemide.   Objective:  Vital signs in last 24 hours:  Temp:  [97.5 F (36.4 C)-98.3 F (36.8 C)] 97.8 F (36.6 C) (02/01 0900) Pulse Rate:  [84-133] 97 (02/01 0822) Resp:  [18-53] 27 (02/01 0100) BP: (116-193)/(24-154) 137/99 (02/01 0822) SpO2:  [93 %-100 %] 100 % (02/01 0100) FiO2 (%):  [35 %] 35 % (01/31 2216) Weight:  [64.7 kg (142 lb 10.2 oz)-68.9 kg (152 lb)] 64.7 kg (142 lb 10.2 oz) (01/31 2200)  Weight change:  Filed Weights   11/18/17 1707 11/18/17 2200  Weight: 68.9 kg (152 lb) 64.7 kg (142 lb 10.2 oz)    Intake/Output: I/O last 3 completed shifts: In: 200 [IV Piggyback:200] Out: 200 [Urine:200]   Intake/Output this shift:  Total I/O In: -  Out: 200 [Urine:200]  Physical Exam: General: NAD, sitting up in bed  Head: Normocephalic, atraumatic. Moist oral mucosal membranes  Eyes: Anicteric, PERRL  Neck: Supple, trachea midline  Lungs:  Basilar rales.   Heart: Regular rate and rhythm  Abdomen:  Soft, nontender,   Extremities: no peripheral edema.  Neurologic: Nonfocal, moving all four extremities  Skin: No lesions        Basic Metabolic Panel: Recent Labs  Lab 11/18/17 1715 11/19/17 0411  NA 135 136  K 3.8 3.9  CL 108 105  CO2 19* 19*  GLUCOSE 97 119*  BUN 29* 33*  CREATININE 1.12* 1.23*  CALCIUM 8.8* 8.7*    Liver Function Tests: No results for input(s): AST, ALT, ALKPHOS, BILITOT, PROT, ALBUMIN in the last 168 hours. No results for input(s): LIPASE, AMYLASE in the last 168 hours. No results for input(s): AMMONIA in the last 168 hours.  CBC: Recent Labs  Lab 11/18/17 1715  WBC 3.9  HGB 10.6*  HCT  30.9*  MCV 91.4  PLT 111*    Cardiac Enzymes: Recent Labs  Lab 11/18/17 1715 11/18/17 2342 11/19/17 0411  TROPONINI 0.10* 0.12* 0.09*    BNP: Invalid input(s): POCBNP  CBG: Recent Labs  Lab 11/18/17 2156  GLUCAP 126*    Microbiology: Results for orders placed or performed during the hospital encounter of 11/18/17  MRSA PCR Screening     Status: None   Collection Time: 11/18/17  9:50 PM  Result Value Ref Range Status   MRSA by PCR NEGATIVE NEGATIVE Final    Comment:        The GeneXpert MRSA Assay (FDA approved for NASAL specimens only), is one component of a comprehensive MRSA colonization surveillance program. It is not intended to diagnose MRSA infection nor to guide or monitor treatment for MRSA infections. Performed at [facility], [address] [place], [place] [address]     Coagulation Studies: No results for input(s): LABPROT, INR in the last 72 hours.  Urinalysis: No results for input(s): COLORURINE, LABSPEC, PHURINE, GLUCOSEU, HGBUR, BILIRUBINUR, KETONESUR, PROTEINUR, UROBILINOGEN, NITRITE, LEUKOCYTESUR in the last 72 hours.  Invalid input(s): APPERANCEUR    Imaging: Dg Chest 2 View  Result Date: 11/18/2017 CLINICAL DATA:  Shortness of Breath EXAM: CHEST  2 VIEW COMPARISON:  CT 11/04/2017 FINDINGS: Cardiomegaly. Vascular congestion. Bilateral interstitial prominence with lower lobe airspace opacities concerning for edema/CHF effusions suspected. No acute  bony abnormality. IMPRESSION: Cardiomegaly with vascular congestion, interstitial and alveolar opacities most compatible with edema/CH F. Small effusions. Electronically Signed   By: Rolm Baptise M.D.   On: 11/18/2017 18:03   Ct Angio Chest Pe W And/or Wo Contrast  Result Date: 11/18/2017 CLINICAL DATA:  Breast cancer with shortness of breath. EXAM: CT ANGIOGRAPHY CHEST WITH CONTRAST TECHNIQUE: Multidetector CT imaging of the chest was performed using the standard protocol during bolus  administration of intravenous contrast. Multiplanar CT image reconstructions and MIPs were obtained to evaluate the vascular anatomy. CONTRAST:  3mL ISOVUE-370 IOPAMIDOL (ISOVUE-370) INJECTION 76% COMPARISON:  11/04/2017 FINDINGS: Cardiovascular: Heart is enlarged. Similar pericardial effusion to prior study. No filling defect in the opacified pulmonary arteries to suggest the presence of an acute pulmonary embolus. Mediastinum/Nodes: Similar mediastinal lymphadenopathy. 12 mm short axis prevascular lymph node identified. Hilar lymphadenopathy persists. Progression of axillary and right subpectoral lymph nodes noted. Index 13 mm short axis right axillary lymph node is seen on image 30. 14 mm short axis subpectoral lymph node is seen on image 15, increased from 12 mm previously. Lungs/Pleura: Interval development of small right pleural effusion with persistent tiny left pleural effusion. There is diffuse interstitial and central ground-glass attenuation in both lungs, becoming more confluent in the lower lobes bilaterally. Subpleural opacity in the anterior left upper lobe likely related to radiation. Upper Abdomen: Calcified gallstones incompletely visualized. Musculoskeletal: Bony metastases again noted in the thoracic spine and sternum Review of the MIP images confirms the above findings. IMPRESSION: 1. No CT evidence for acute pulmonary embolus. 2. Similar appearance of pericardial effusion with mediastinal, hilar, right axillary/subpectoral lymphadenopathy. Lymphadenopathy in the right axilla and subpectoral region appears minimally progressed in the interval. 3. Progression of interstitial and airspace disease in both lungs with a central predominance. Infectious/inflammatory etiology possible although pulmonary edema or even pulmonary hemorrhage could have this appearance. 4. New small right pleural effusion with persistent tiny left effusion. 5. Cholelithiasis. 6. Bone metastases, similar to prior.  Electronically Signed   By: Misty Stanley M.D.   On: 11/18/2017 19:49   Dg Chest Port 1 View  Result Date: 11/19/2017 CLINICAL DATA:  Respiratory failure EXAM: PORTABLE CHEST 1 VIEW COMPARISON:  CT of 1 day prior.  Plain film of 1 day prior. FINDINGS: Left axillary node dissection. Midline trachea. Cardiomegaly accentuated by AP portable technique. Small right pleural effusion. No pneumothorax. Mild interstitial edema is decreased. No change in bibasilar airspace disease. IMPRESSION: Cardiomegaly with improved interstitial edema. Similar bibasilar airspace opacities, likely infection. Electronically Signed   By: Abigail Miyamoto M.D.   On: 11/19/2017 07:50     Medications:   . sodium chloride     . aspirin EC  81 mg Oral Daily  . carvedilol  12.5 mg Oral BID WC  . cholecalciferol  2,000 Units Oral Daily  . [START ON 11/29/2017] cloNIDine  0.2 mg Transdermal Weekly  . feeding supplement (ENSURE ENLIVE)  237 mL Oral TID BM  . furosemide  40 mg Intravenous BID  . heparin  5,000 Units Subcutaneous Q8H  . losartan  50 mg Oral Daily   And  . hydrochlorothiazide  12.5 mg Oral Daily  . letrozole  2.5 mg Oral Daily  . sodium chloride flush  3 mL Intravenous Q12H   sodium chloride, acetaminophen, albuterol, ALPRAZolam, hydrALAZINE, ondansetron (ZOFRAN) IV, sodium chloride flush, triamcinolone cream  Assessment/ Plan:  Ms. Charliegh Vasudevan is a 52 y.o. black female  with hypertension, anemia, vitamin D deficiency, breast cancer  status post left mastectomy, chemotherapy and radiation who presents as a follow up patient.  1. Acute renal failure on chronic kidney disease stage III with proteinuria, hematuria and Positive ANA:  Acute renal failure secondary to acute cardiorenal syndrome.  - continue losartan - Continue furosemide - Creatinine at baseline   2. Hypertension: with acute exacerbation of congestive heart failure. Echo with 45-50% - Home regimen of losartan, hydrochlorothiazide, clonidine.  -  low salt diet - Consider changing hydrochlorothiazide to furosemide.   3. Anemia: beta thalassemia. CBC at baseline.     LOS: 1 Sira Adsit 2/1/20199:50 AM
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
## 2017-11-19 NOTE — Progress Notes (Signed)
PULMONARY / CRITICAL CARE MEDICINE   Name: Melanie Holloway MRN: [id number] DOB: March 30, 1966    ADMISSION DATE:  11/18/2017 CONSULTATION DATE:  11/18/2017  REFERRING MD:  Dr. Jerelyn Charles  CHIEF COMPLAINT:  Dysnoea  HISTORY OF PRESENT ILLNESS:   52 yo female admitted with acute on chronic hypoxic respiratory failure secondary to CHF exacerbation requiring BIPAP. She is known to have left breast cancer with malignant pericardial effusion.   REVIEW OF SYSTEMS:   14 point ROS negative. Dysnoea has resolved.  SUBJECTIVE:  1 episode of nausea and vomiting today after Hydralazine for elevated BP.  VITAL SIGNS: BP (!) 133/103   Pulse 89   Temp 97.8 F (36.6 C)   Resp (!) 27   Ht 4\' 11"  (1.499 m)   Wt 142 lb 10.2 oz (64.7 kg)   SpO2 100%   BMI 28.81 kg/m   HEMODYNAMICS:  No compromise  VENTILATOR SETTINGS: 2 liters [place] Oxygen, off BIPAP INTAKE / OUTPUT: I/O last 3 completed shifts: In: 200 [IV Piggyback:200] Out: 200 [Urine:200]  PHYSICAL EXAMINATION: General:  Flat affect, no distress Neuro:  Awake, alert and appropriate, oriented  HEENT: PERRL Cardiovascular:  RRR Lungs:  Few crackles Abdomen:  Soft, NT Musculoskeletal:  No oedema Skin:  Warm and dry  LABS:  BMET Recent Labs  Lab 11/18/17 1715 11/19/17 0411  NA 135 136  K 3.8 3.9  CL 108 105  CO2 19* 19*  BUN 29* 33*  CREATININE 1.12* 1.23*  GLUCOSE 97 119*    Electrolytes Recent Labs  Lab 11/18/17 1715 11/19/17 0411  CALCIUM 8.8* 8.7*    CBC Recent Labs  Lab 11/18/17 1715  WBC 3.9  HGB 10.6*  HCT 30.9*  PLT 111*    Coag's No results for input(s): APTT, INR in the last 168 hours.  Sepsis Markers No results for input(s): LATICACIDVEN, PROCALCITON, O2SATVEN in the last 168 hours.  ABG No results for input(s): PHART, PCO2ART, PO2ART in the last 168 hours.  Liver Enzymes No results for input(s): AST, ALT, ALKPHOS, BILITOT, ALBUMIN in the last 168 hours.  Cardiac Enzymes Recent Labs  Lab  11/18/17 1715 11/18/17 2342 11/19/17 0411  TROPONINI 0.10* 0.12* 0.09*    Glucose Recent Labs  Lab 11/18/17 2156  GLUCAP 126*    Imaging Dg Chest 2 View  Result Date: 11/18/2017 CLINICAL DATA:  Shortness of Breath EXAM: CHEST  2 VIEW COMPARISON:  CT 11/04/2017 FINDINGS: Cardiomegaly. Vascular congestion. Bilateral interstitial prominence with lower lobe airspace opacities concerning for edema/CHF effusions suspected. No acute bony abnormality. IMPRESSION: Cardiomegaly with vascular congestion, interstitial and alveolar opacities most compatible with edema/CH F. Small effusions. Electronically Signed   By: Rolm Baptise M.D.   On: 11/18/2017 18:03   Ct Angio Chest Pe W And/or Wo Contrast  Result Date: 11/18/2017 CLINICAL DATA:  Breast cancer with shortness of breath. EXAM: CT ANGIOGRAPHY CHEST WITH CONTRAST TECHNIQUE: Multidetector CT imaging of the chest was performed using the standard protocol during bolus administration of intravenous contrast. Multiplanar CT image reconstructions and MIPs were obtained to evaluate the vascular anatomy. CONTRAST:  41mL ISOVUE-370 IOPAMIDOL (ISOVUE-370) INJECTION 76% COMPARISON:  11/04/2017 FINDINGS: Cardiovascular: Heart is enlarged. Similar pericardial effusion to prior study. No filling defect in the opacified pulmonary arteries to suggest the presence of an acute pulmonary embolus. Mediastinum/Nodes: Similar mediastinal lymphadenopathy. 12 mm short axis prevascular lymph node identified. Hilar lymphadenopathy persists. Progression of axillary and right subpectoral lymph nodes noted. Index 13 mm short axis right axillary lymph node is  seen on image 30. 14 mm short axis subpectoral lymph node is seen on image 15, increased from 12 mm previously. Lungs/Pleura: Interval development of small right pleural effusion with persistent tiny left pleural effusion. There is diffuse interstitial and central ground-glass attenuation in both lungs, becoming more confluent  in the lower lobes bilaterally. Subpleural opacity in the anterior left upper lobe likely related to radiation. Upper Abdomen: Calcified gallstones incompletely visualized. Musculoskeletal: Bony metastases again noted in the thoracic spine and sternum Review of the MIP images confirms the above findings. IMPRESSION: 1. No CT evidence for acute pulmonary embolus. 2. Similar appearance of pericardial effusion with mediastinal, hilar, right axillary/subpectoral lymphadenopathy. Lymphadenopathy in the right axilla and subpectoral region appears minimally progressed in the interval. 3. Progression of interstitial and airspace disease in both lungs with a central predominance. Infectious/inflammatory etiology possible although pulmonary edema or even pulmonary hemorrhage could have this appearance. 4. New small right pleural effusion with persistent tiny left effusion. 5. Cholelithiasis. 6. Bone metastases, similar to prior. Electronically Signed   By: Misty Stanley M.D.   On: 11/18/2017 19:49   Dg Chest Port 1 View  Result Date: 11/19/2017 CLINICAL DATA:  Respiratory failure EXAM: PORTABLE CHEST 1 VIEW COMPARISON:  CT of 1 day prior.  Plain film of 1 day prior. FINDINGS: Left axillary node dissection. Midline trachea. Cardiomegaly accentuated by AP portable technique. Small right pleural effusion. No pneumothorax. Mild interstitial edema is decreased. No change in bibasilar airspace disease. IMPRESSION: Cardiomegaly with improved interstitial edema. Similar bibasilar airspace opacities, likely infection. Electronically Signed   By: Abigail Miyamoto M.D.   On: 11/19/2017 07:50     STUDIES:  ECHO report from 11/11/2017 noted   ASSESSMENT / PLAN:  PULMONARY A: Acute on chronic hypoxic respiratory failure secondary to exacerbation of Systolic dysfunction CHF   P:   Continue on Lasix therapy, early mobilization  CARDIOVASCULAR A:  Malignant pericardial effusion- chronic without tamponade physiology Systolic  dysfunction CHF as above Hypertension P:  Cardiology team  consulted for F/U, continue on Carvedilol, diuretics BP control  RENAL A:   Azotemia likely from diuretic therapy  P:   Will monitor closely  GASTROINTESTINAL A:   No acute issues P:     HEMATOLOGIC A:   Left breast malignancy P:   INFECTIOUS A:   Does not appear to have an infectious etiology P:   Will de-escalate ABX tomorrow if all cultures negative  ENDOCRINE A:   No active issues   P:   Target Glucose management  NEUROLOGIC A:   No active issues but patient does have a blank affect P:   Monitor for depression   FAMILY  - Updates: will update when available     Cammie Sickle, MD Pulmonary and [place] Pager: [phone]  11/19/2017, 2:22 PM

## 2017-11-19 NOTE — Progress Notes (Signed)
Patient ID: Melanie Holloway, female   DOB: 1965/12/12, 52 y.o.   MRN: [id number]  [facility] PROGRESS NOTE  Sedonia Kitner KNL:[id number] DOB: Mar 27, 1966 DOA: 11/18/2017 PCP: Ria Bush, MD  HPI/Subjective: Patient breathing a little bit better.   Still with some shortness of breath.  Still with some cough.  Objective: Vitals:   11/19/17 1000 11/19/17 1050  BP: (!) 133/103 (!) 133/103  Pulse: 89   Resp: (!) 27   Temp:    SpO2: 100%     Filed Weights   11/18/17 1707 11/18/17 2200  Weight: 68.9 kg (152 lb) 64.7 kg (142 lb 10.2 oz)    ROS: Review of Systems  Constitutional: Negative for chills and fever.  Eyes: Negative for blurred vision.  Respiratory: Positive for shortness of breath. Negative for cough.   Cardiovascular: Negative for chest pain.  Gastrointestinal: Negative for abdominal pain, constipation, diarrhea, nausea and vomiting.  Genitourinary: Negative for dysuria.  Musculoskeletal: Negative for joint pain.  Neurological: Negative for dizziness and headaches.   Exam: Physical Exam  Constitutional: She is oriented to person, place, and time.  HENT:  Nose: No mucosal edema.  Mouth/Throat: No oropharyngeal exudate or posterior oropharyngeal edema.  Eyes: Conjunctivae, EOM and lids are normal. Pupils are equal, round, and reactive to light.  Neck: No JVD present. Carotid bruit is not present. No edema present. No thyroid mass and no thyromegaly present.  Cardiovascular: S1 normal and S2 normal. Exam reveals no gallop.  No murmur heard. Pulses:      Dorsalis pedis pulses are 2+ on the right side, and 2+ on the left side.  Respiratory: No respiratory distress. She has decreased breath sounds in the right middle field, the right lower field, the left middle field and the left lower field. She has no wheezes. She has no rhonchi. She has rales in the right middle field, the right lower field, the left middle field and the left lower field.  GI: Soft. Bowel sounds  are normal. There is no tenderness.  Musculoskeletal:       Right ankle: She exhibits swelling.       Left ankle: She exhibits swelling.  Lymphadenopathy:    She has no cervical adenopathy.  Neurological: She is alert and oriented to person, place, and time. No cranial nerve deficit.  Skin: Skin is warm. No rash noted. Nails show no clubbing.  Psychiatric: She has a normal mood and affect.      Data Reviewed: Basic Metabolic Panel: Recent Labs  Lab 11/18/17 1715 11/19/17 0411  NA 135 136  K 3.8 3.9  CL 108 105  CO2 19* 19*  GLUCOSE 97 119*  BUN 29* 33*  CREATININE 1.12* 1.23*  CALCIUM 8.8* 8.7*   CBC: Recent Labs  Lab 11/18/17 1715  WBC 3.9  HGB 10.6*  HCT 30.9*  MCV 91.4  PLT 111*   Cardiac Enzymes: Recent Labs  Lab 11/18/17 1715 11/18/17 2342 11/19/17 0411  TROPONINI 0.10* 0.12* 0.09*   BNP (last 3 results) Recent Labs    11/18/17 1715  BNP 2,975.0*    CBG: Recent Labs  Lab 11/18/17 2156  GLUCAP 126*    Recent Results (from the past 240 hour(s))  MRSA PCR Screening     Status: None   Collection Time: 11/18/17  9:50 PM  Result Value Ref Range Status   MRSA by PCR NEGATIVE NEGATIVE Final    Comment:        The GeneXpert MRSA Assay (FDA approved for NASAL  specimens only), is one component of a comprehensive MRSA colonization surveillance program. It is not intended to diagnose MRSA infection nor to guide or monitor treatment for MRSA infections. Performed at [facility], [address] [place], [place] [address]      Studies: Dg Chest 2 View  Result Date: 11/18/2017 CLINICAL DATA:  Shortness of Breath EXAM: CHEST  2 VIEW COMPARISON:  CT 11/04/2017 FINDINGS: Cardiomegaly. Vascular congestion. Bilateral interstitial prominence with lower lobe airspace opacities concerning for edema/CHF effusions suspected. No acute bony abnormality. IMPRESSION: Cardiomegaly with vascular congestion, interstitial and alveolar opacities most  compatible with edema/CH F. Small effusions. Electronically Signed   By: Rolm Baptise M.D.   On: 11/18/2017 18:03   Ct Angio Chest Pe W And/or Wo Contrast  Result Date: 11/18/2017 CLINICAL DATA:  Breast cancer with shortness of breath. EXAM: CT ANGIOGRAPHY CHEST WITH CONTRAST TECHNIQUE: Multidetector CT imaging of the chest was performed using the standard protocol during bolus administration of intravenous contrast. Multiplanar CT image reconstructions and MIPs were obtained to evaluate the vascular anatomy. CONTRAST:  66mL ISOVUE-370 IOPAMIDOL (ISOVUE-370) INJECTION 76% COMPARISON:  11/04/2017 FINDINGS: Cardiovascular: Heart is enlarged. Similar pericardial effusion to prior study. No filling defect in the opacified pulmonary arteries to suggest the presence of an acute pulmonary embolus. Mediastinum/Nodes: Similar mediastinal lymphadenopathy. 12 mm short axis prevascular lymph node identified. Hilar lymphadenopathy persists. Progression of axillary and right subpectoral lymph nodes noted. Index 13 mm short axis right axillary lymph node is seen on image 30. 14 mm short axis subpectoral lymph node is seen on image 15, increased from 12 mm previously. Lungs/Pleura: Interval development of small right pleural effusion with persistent tiny left pleural effusion. There is diffuse interstitial and central ground-glass attenuation in both lungs, becoming more confluent in the lower lobes bilaterally. Subpleural opacity in the anterior left upper lobe likely related to radiation. Upper Abdomen: Calcified gallstones incompletely visualized. Musculoskeletal: Bony metastases again noted in the thoracic spine and sternum Review of the MIP images confirms the above findings. IMPRESSION: 1. No CT evidence for acute pulmonary embolus. 2. Similar appearance of pericardial effusion with mediastinal, hilar, right axillary/subpectoral lymphadenopathy. Lymphadenopathy in the right axilla and subpectoral region appears minimally  progressed in the interval. 3. Progression of interstitial and airspace disease in both lungs with a central predominance. Infectious/inflammatory etiology possible although pulmonary edema or even pulmonary hemorrhage could have this appearance. 4. New small right pleural effusion with persistent tiny left effusion. 5. Cholelithiasis. 6. Bone metastases, similar to prior. Electronically Signed   By: Misty Stanley M.D.   On: 11/18/2017 19:49   Dg Chest Port 1 View  Result Date: 11/19/2017 CLINICAL DATA:  Respiratory failure EXAM: PORTABLE CHEST 1 VIEW COMPARISON:  CT of 1 day prior.  Plain film of 1 day prior. FINDINGS: Left axillary node dissection. Midline trachea. Cardiomegaly accentuated by AP portable technique. Small right pleural effusion. No pneumothorax. Mild interstitial edema is decreased. No change in bibasilar airspace disease. IMPRESSION: Cardiomegaly with improved interstitial edema. Similar bibasilar airspace opacities, likely infection. Electronically Signed   By: Abigail Miyamoto M.D.   On: 11/19/2017 07:50    Scheduled Meds: . aspirin EC  81 mg Oral Daily  . carvedilol  12.5 mg Oral BID WC  . cholecalciferol  2,000 Units Oral Daily  . [START ON 11/29/2017] cloNIDine  0.2 mg Transdermal Weekly  . feeding supplement (ENSURE ENLIVE)  237 mL Oral TID BM  . furosemide  40 mg Intravenous BID  . heparin  5,000 Units Subcutaneous Q8H  . losartan  50 mg Oral Daily   And  . hydrochlorothiazide  12.5 mg Oral Daily  . letrozole  2.5 mg Oral Daily  . sodium chloride flush  3 mL Intravenous Q12H   Continuous Infusions: . sodium chloride      Assessment/Plan:  1. Acute hypoxic respiratory failure.  Patient  has been tapered off BiPAP and now on nasal cannula 2 L. 2. Acute mid range congestive heart failure with EF of 45-50% with moderate to large pericardial effusion.  Case discussed with critical care specialist to consult cardiology.  Patient is on Coreg, IV Lasix and  losartan. 3. Metastatic breast cancer.  The patient does have lymphadenopathy seen on the chest and will need outpatient follow-up. 4. Accelerated hypertension.  Blood pressure elevated today.  Patient is on numerous medications. 5. Chronic morbid obesity  Code Status:     Code Status Orders  (From admission, onward)        Start     Ordered   11/18/17 2216  Full code  Continuous     11/18/17 2217    Code Status History    Date Active Date Inactive Code Status Order ID Comments User Context   09/22/2017 03:26 09/22/2017 19:40 Full Code [id number]  Lance Coon, MD Inpatient     Family Communication: Family at bedside Disposition Plan: TBD  Consultants:  Critical care specialist  Antibiotics:  Patient received antibiotics in ER  Time spent: 25 minutes  [place]

## 2017-11-19 NOTE — Care Management Note (Addendum)
Case Management Note  Patient Details  Name: Melanie Holloway MRN: [id number] Date of Birth: 03-Mar-1966  Subjective/Objective:                  Met with patient while in ICU. Her PCP is with [facility]. She states she is independent from home with her husband. She states she still works. She states she is not having any problems obtaining medication.She has a scale at home to weigh.   She is not on home O2. I understand that she has breast cancer followed by Oncologist and now presents in new heart failure (may have a pericardial effusion). Cardiology consult entered by ICU MD.   Action/Plan: RNCM team to follow.   Expected Discharge Date:                  Expected Discharge Plan:     In-House Referral:     Discharge planning Services  CM Consult  Post Acute Care Choice:    Choice offered to:  Patient, Adult Children  DME Arranged:    DME Agency:     HH Arranged:    [place] Agency:     Status of Service:  In process, will continue to follow  If discussed at Long Length of Stay Meetings, dates discussed:    Additional Comments:  Marshell Garfinkel, RN 11/19/2017, 12:57 PM

## 2017-11-20 DIAGNOSIS — C801 Malignant (primary) neoplasm, unspecified: Secondary | ICD-10-CM

## 2017-11-20 DIAGNOSIS — I5021 Acute systolic (congestive) heart failure: Secondary | ICD-10-CM

## 2017-11-20 DIAGNOSIS — J9601 Acute respiratory failure with hypoxia: Secondary | ICD-10-CM

## 2017-11-20 DIAGNOSIS — I318 Other specified diseases of pericardium: Secondary | ICD-10-CM

## 2017-11-20 LAB — GLUCOSE, CAPILLARY: GLUCOSE-CAPILLARY: 85 mg/dL (ref 65–99)

## 2017-11-20 LAB — BASIC METABOLIC PANEL
ANION GAP: 8 (ref 5–15)
BUN: 46 mg/dL — ABNORMAL HIGH (ref 6–20)
CALCIUM: 8.3 mg/dL — AB (ref 8.9–10.3)
CO2: 22 mmol/L (ref 22–32)
Chloride: 108 mmol/L (ref 101–111)
Creatinine, Ser: 1.85 mg/dL — ABNORMAL HIGH (ref 0.44–1.00)
GFR, EST AFRICAN AMERICAN: 35 mL/min — AB (ref >60)
GFR, EST NON AFRICAN AMERICAN: 30 mL/min — AB (ref >60)
GLUCOSE: 86 mg/dL (ref 65–99)
Potassium: 3.7 mmol/L (ref 3.5–5.1)
Sodium: 138 mmol/L (ref 135–145)

## 2017-11-20 LAB — MAGNESIUM: Magnesium: 1.5 mg/dL — ABNORMAL LOW (ref 1.7–2.4)

## 2017-11-20 LAB — PHOSPHORUS: PHOSPHORUS: 6.4 mg/dL — AB (ref 2.5–4.6)

## 2017-11-20 LAB — BRAIN NATRIURETIC PEPTIDE: B Natriuretic Peptide: 852 pg/mL — ABNORMAL HIGH (ref 0.0–100.0)

## 2017-11-20 MED ORDER — MAGNESIUM SULFATE 2 GM/50ML IV SOLN
2.0000 g | Freq: Once | INTRAVENOUS | Status: AC
  Administered 2017-11-20: 2 g via INTRAVENOUS
  Filled 2017-11-20: qty 50

## 2017-11-20 MED ORDER — ENOXAPARIN SODIUM 30 MG/0.3ML ~~LOC~~ SOLN
30.0000 mg | SUBCUTANEOUS | Status: DC
  Administered 2017-11-20: 30 mg via SUBCUTANEOUS
  Filled 2017-11-20: qty 0.3

## 2017-11-20 MED ORDER — ALBUTEROL SULFATE (2.5 MG/3ML) 0.083% IN NEBU
2.5000 mg | INHALATION_SOLUTION | Freq: Four times a day (QID) | RESPIRATORY_TRACT | Status: DC
  Administered 2017-11-20 – 2017-11-23 (×11): 2.5 mg via RESPIRATORY_TRACT
  Filled 2017-11-20 (×11): qty 3

## 2017-11-20 MED ORDER — FUROSEMIDE 10 MG/ML IJ SOLN: 40.0000 mg | Freq: Every day | INTRAMUSCULAR | Status: DC

## 2017-11-20 MED ORDER — BUDESONIDE 0.5 MG/2ML IN SUSP
0.5000 mg | Freq: Two times a day (BID) | RESPIRATORY_TRACT | Status: DC
  Administered 2017-11-20 – 2017-12-03 (×25): 0.5 mg via RESPIRATORY_TRACT
  Filled 2017-11-20 (×26): qty 2

## 2017-11-20 NOTE — Progress Notes (Signed)
[facility]  ROUNDING NOTE   Subjective:   Moved from ICU to 2A  Iv furosemide.   Creatinine 1.85 (1.23)  Echocardiogram with pericardial effusion without chamber collapse  Objective:  Vital signs in last 24 hours:  Temp:  [97.6 F (36.4 C)-98.1 F (36.7 C)] 98.1 F (36.7 C) (02/02 0100) Pulse Rate:  [80-96] 87 (02/02 0929) Resp:  [14-27] 14 (02/02 0929) BP: (108-133)/(77-103) 108/83 (02/02 0929) SpO2:  [96 %-100 %] 96 % (02/02 0929) Weight:  [65.2 kg (143 lb 11.8 oz)-66.5 kg (146 lb 8 oz)] 66.5 kg (146 lb 8 oz) (02/02 0929)  Weight change: -3.747 kg (-4.2 oz) Filed Weights   11/18/17 2200 11/20/17 0532 11/20/17 0929  Weight: 64.7 kg (142 lb 10.2 oz) 65.2 kg (143 lb 11.8 oz) 66.5 kg (146 lb 8 oz)    Intake/Output: I/O last 3 completed shifts: In: 3 [I.V.:3] Out: 650 [Urine:650]   Intake/Output this shift:  No intake/output data recorded.  Physical Exam: General: NAD, sitting up in bed  Head: Normocephalic, atraumatic. Moist oral mucosal membranes  Eyes: Anicteric, PERRL  Neck: Supple, trachea midline  Lungs:  Basilar rales.   Heart: Regular rate and rhythm  Abdomen:  Soft, nontender,   Extremities: no peripheral edema.  Neurologic: Nonfocal, moving all four extremities  Skin: No lesions        Basic Metabolic Panel: Recent Labs  Lab 11/18/17 1715 11/19/17 0411 11/20/17 0405  NA 135 136 138  K 3.8 3.9 3.7  CL 108 105 108  CO2 19* 19* 22  GLUCOSE 97 119* 86  BUN 29* 33* 46*  CREATININE 1.12* 1.23* 1.85*  CALCIUM 8.8* 8.7* 8.3*  MG  --   --  1.5*  PHOS  --   --  6.4*    Liver Function Tests: No results for input(s): AST, ALT, ALKPHOS, BILITOT, PROT, ALBUMIN in the last 168 hours. No results for input(s): LIPASE, AMYLASE in the last 168 hours. No results for input(s): AMMONIA in the last 168 hours.  CBC: Recent Labs  Lab 11/18/17 1715  WBC 3.9  HGB 10.6*  HCT 30.9*  MCV 91.4  PLT 111*    Cardiac Enzymes: Recent Labs   Lab 11/18/17 1715 11/18/17 2342 11/19/17 0411 11/19/17 1616  TROPONINI 0.10* 0.12* 0.09* 0.06*    BNP: Invalid input(s): POCBNP  CBG: Recent Labs  Lab 11/18/17 2156 11/19/17 1952 11/20/17 0027  GLUCAP 126* 96 71    Microbiology: Results for orders placed or performed during the hospital encounter of 11/18/17  MRSA PCR Screening     Status: None   Collection Time: 11/18/17  9:50 PM  Result Value Ref Range Status   MRSA by PCR NEGATIVE NEGATIVE Final    Comment:        The GeneXpert MRSA Assay (FDA approved for NASAL specimens only), is one component of a comprehensive MRSA colonization surveillance program. It is not intended to diagnose MRSA infection nor to guide or monitor treatment for MRSA infections. Performed at [facility], [address] [place], [place] [address]     Coagulation Studies: No results for input(s): LABPROT, INR in the last 72 hours.  Urinalysis: No results for input(s): COLORURINE, LABSPEC, PHURINE, GLUCOSEU, HGBUR, BILIRUBINUR, KETONESUR, PROTEINUR, UROBILINOGEN, NITRITE, LEUKOCYTESUR in the last 72 hours.  Invalid input(s): APPERANCEUR    Imaging: Dg Chest 2 View  Result Date: 11/18/2017 CLINICAL DATA:  Shortness of Breath EXAM: CHEST  2 VIEW COMPARISON:  CT 11/04/2017 FINDINGS: Cardiomegaly. Vascular congestion.  Bilateral interstitial prominence with lower lobe airspace opacities concerning for edema/CHF effusions suspected. No acute bony abnormality. IMPRESSION: Cardiomegaly with vascular congestion, interstitial and alveolar opacities most compatible with edema/CH F. Small effusions. Electronically Signed   By: Rolm Baptise M.D.   On: 11/18/2017 18:03   Ct Angio Chest Pe W And/or Wo Contrast  Result Date: 11/18/2017 CLINICAL DATA:  Breast cancer with shortness of breath. EXAM: CT ANGIOGRAPHY CHEST WITH CONTRAST TECHNIQUE: Multidetector CT imaging of the chest was performed using the standard protocol during bolus  administration of intravenous contrast. Multiplanar CT image reconstructions and MIPs were obtained to evaluate the vascular anatomy. CONTRAST:  30mL ISOVUE-370 IOPAMIDOL (ISOVUE-370) INJECTION 76% COMPARISON:  11/04/2017 FINDINGS: Cardiovascular: Heart is enlarged. Similar pericardial effusion to prior study. No filling defect in the opacified pulmonary arteries to suggest the presence of an acute pulmonary embolus. Mediastinum/Nodes: Similar mediastinal lymphadenopathy. 12 mm short axis prevascular lymph node identified. Hilar lymphadenopathy persists. Progression of axillary and right subpectoral lymph nodes noted. Index 13 mm short axis right axillary lymph node is seen on image 30. 14 mm short axis subpectoral lymph node is seen on image 15, increased from 12 mm previously. Lungs/Pleura: Interval development of small right pleural effusion with persistent tiny left pleural effusion. There is diffuse interstitial and central ground-glass attenuation in both lungs, becoming more confluent in the lower lobes bilaterally. Subpleural opacity in the anterior left upper lobe likely related to radiation. Upper Abdomen: Calcified gallstones incompletely visualized. Musculoskeletal: Bony metastases again noted in the thoracic spine and sternum Review of the MIP images confirms the above findings. IMPRESSION: 1. No CT evidence for acute pulmonary embolus. 2. Similar appearance of pericardial effusion with mediastinal, hilar, right axillary/subpectoral lymphadenopathy. Lymphadenopathy in the right axilla and subpectoral region appears minimally progressed in the interval. 3. Progression of interstitial and airspace disease in both lungs with a central predominance. Infectious/inflammatory etiology possible although pulmonary edema or even pulmonary hemorrhage could have this appearance. 4. New small right pleural effusion with persistent tiny left effusion. 5. Cholelithiasis. 6. Bone metastases, similar to prior.  Electronically Signed   By: Misty Stanley M.D.   On: 11/18/2017 19:49   Dg Chest Port 1 View  Result Date: 11/19/2017 CLINICAL DATA:  Respiratory failure EXAM: PORTABLE CHEST 1 VIEW COMPARISON:  CT of 1 day prior.  Plain film of 1 day prior. FINDINGS: Left axillary node dissection. Midline trachea. Cardiomegaly accentuated by AP portable technique. Small right pleural effusion. No pneumothorax. Mild interstitial edema is decreased. No change in bibasilar airspace disease. IMPRESSION: Cardiomegaly with improved interstitial edema. Similar bibasilar airspace opacities, likely infection. Electronically Signed   By: Abigail Miyamoto M.D.   On: 11/19/2017 07:50     Medications:   . sodium chloride     . aspirin EC  81 mg Oral Daily  . carvedilol  12.5 mg Oral BID WC  . cholecalciferol  2,000 Units Oral Daily  . [START ON 11/29/2017] cloNIDine  0.2 mg Transdermal Weekly  . feeding supplement (ENSURE ENLIVE)  237 mL Oral TID BM  . furosemide  40 mg Intravenous BID  . losartan  50 mg Oral Daily   And  . hydrochlorothiazide  12.5 mg Oral Daily  . letrozole  2.5 mg Oral Daily  . sodium chloride flush  3 mL Intravenous Q12H   sodium chloride, acetaminophen, albuterol, ALPRAZolam, hydrALAZINE, ondansetron (ZOFRAN) IV, sodium chloride flush, triamcinolone cream  Assessment/ Plan:  [name] is a 52 y.o. black female  with  hypertension, anemia, vitamin D deficiency, breast cancer status post left mastectomy, chemotherapy and radiation who presents as a follow up patient.  1. Acute renal failure on chronic kidney disease stage III with proteinuria, hematuria and Positive ANA:  Acute renal failure secondary to acute cardiorenal syndrome.  - continue losartan - Continue furosemide: 40mg  IV q12. May transition to PO tomorrow - Creatinine at baseline   2. Hypertension: with acute exacerbation of congestive heart failure. Echo with 45-50%. With pericardial effusion.  - Appreciate cardiology input -  Home regimen of losartan, hydrochlorothiazide, clonidine. Discontinue hydrochlorothiazide.  - Continue losartan - low salt diet. Fluid restriction  3. Anemia: beta thalassemia. CBC at baseline. Hemoglobin 10.6    LOS: 2 Stevey Stapleton 2/2/20199:42 AM

## 2017-11-20 NOTE — Progress Notes (Signed)
Report to susan RN

## 2017-11-20 NOTE — Progress Notes (Signed)
PULMONARY / CRITICAL CARE MEDICINE   Name: Melanie Holloway MRN: [id number] DOB: 29-Jul-1966    ADMISSION DATE:  11/18/2017 CONSULTATION DATE:  11/18/2017  REFERRING MD:  Dr. Jerelyn Charles  CHIEF COMPLAINT:  Dysnoea  HISTORY OF PRESENT ILLNESS:   52 yo female admitted with acute on chronic hypoxic respiratory failure secondary to CHF exacerbation requiring BIPAP. She is known to have left breast cancer with malignant pericardial effusion.   REVIEW OF SYSTEMS:   14 point ROS negative. Dysnoea has resolved.  SUBJECTIVE:  No acute issues overnight. Off oxygen  VITAL SIGNS: BP 123/84   Pulse 83   Temp 98.1 F (36.7 C) (Oral)   Resp (!) 23   Ht 4\' 11"  (1.499 m)   Wt 65.2 kg (143 lb 11.8 oz)   SpO2 97%   BMI 29.03 kg/m   HEMODYNAMICS:  No compromise  VENTILATOR SETTINGS: 2 liters [place] Oxygen, off BIPAP INTAKE / OUTPUT: I/O last 3 completed shifts: In: 203 [I.V.:3; IV Piggyback:200] Out: 400 [Urine:400]  PHYSICAL EXAMINATION: General:  Flat affect, no distress Neuro:  Awake, alert and appropriate, oriented  HEENT: PERRL Cardiovascular:  RRR Lungs:  CTAB Abdomen:  Soft, NT, +BS X4 Musculoskeletal:  No oedema Skin:  Warm and dry  LABS:  BMET Recent Labs  Lab 11/18/17 1715 11/19/17 0411 11/20/17 0405  NA 135 136 138  K 3.8 3.9 3.7  CL 108 105 108  CO2 19* 19* 22  BUN 29* 33* 46*  CREATININE 1.12* 1.23* 1.85*  GLUCOSE 97 119* 86    Electrolytes Recent Labs  Lab 11/18/17 1715 11/19/17 0411 11/20/17 0405  CALCIUM 8.8* 8.7* 8.3*  MG  --   --  1.5*  PHOS  --   --  6.4*    CBC Recent Labs  Lab 11/18/17 1715  WBC 3.9  HGB 10.6*  HCT 30.9*  PLT 111*    Coag's No results for input(s): APTT, INR in the last 168 hours.  Sepsis Markers No results for input(s): LATICACIDVEN, PROCALCITON, O2SATVEN in the last 168 hours.  ABG No results for input(s): PHART, PCO2ART, PO2ART in the last 168 hours.  Liver Enzymes No results for input(s): AST, ALT, ALKPHOS,  BILITOT, ALBUMIN in the last 168 hours.  Cardiac Enzymes Recent Labs  Lab 11/18/17 2342 11/19/17 0411 11/19/17 1616  TROPONINI 0.12* 0.09* 0.06*    Glucose Recent Labs  Lab 11/18/17 2156 11/19/17 1952 11/20/17 0027  GLUCAP 126* 96 85    Imaging No results found.   STUDIES:  ECHO report from 11/11/2017 noted   ASSESSMENT / PLAN:  PULMONARY A: Acute on chronic hypoxic respiratory failure secondary to exacerbation of Systolic dysfunction CHF   P:   Continue on Lasix therapy, early mobilization  CARDIOVASCULAR A:  Malignant pericardial effusion- chronic without tamponade physiology Systolic dysfunction CHF as above Hypertension P:  Cardiology team  consulted for F/U, continue on Carvedilol, diuretics BP control  RENAL A:   Azotemia likely from diuretic therapy  P:   Will monitor closely  GASTROINTESTINAL A:   No acute issues P:     HEMATOLOGIC A:   Left breast malignancy P:   INFECTIOUS A:   Does not appear to have an infectious etiology P:   Will de-escalate ABX tomorrow if all cultures negative  ENDOCRINE A:   No active issues   P:   Target Glucose management  NEUROLOGIC A:   No active issues but patient does have a blank affect P:   Monitor for depression  Patient is stable for floor transfer FAMILY  - Updates: Patient updated on current treatment plan  Alba Perillo S. [facility] ANP-BC Pulmonary and Critical Care Medicine [facility] Pager [phone] or [phone]  NB: This document was prepared using Dragon voice recognition software and may include unintentional dictation errors.  11/20/2017, 6:36 AM

## 2017-11-20 NOTE — Progress Notes (Signed)
Patient ID: Melanie Holloway, female   DOB: 04/25/1966, 52 y.o.   MRN: [id number]  [facility] PROGRESS NOTE  Melanie Holloway UUV:[id number] DOB: 05-Oct-1966 DOA: 11/18/2017 PCP: Ria Bush, MD  HPI/Subjective: Patient breathing a little bit better.   Patient still with some shortness of breath and some cough.  Objective: Vitals:   11/20/17 0800 11/20/17 0929  BP: 120/90 108/83  Pulse: 91 87  Resp:  14  Temp:    SpO2: 99% 96%    Filed Weights   11/18/17 2200 11/20/17 0532 11/20/17 0929  Weight: 64.7 kg (142 lb 10.2 oz) 65.2 kg (143 lb 11.8 oz) 66.5 kg (146 lb 8 oz)    ROS: Review of Systems  Constitutional: Negative for chills and fever.  Eyes: Negative for blurred vision.  Respiratory: Positive for cough and shortness of breath.   Cardiovascular: Negative for chest pain.  Gastrointestinal: Negative for abdominal pain, constipation, diarrhea, nausea and vomiting.  Genitourinary: Negative for dysuria.  Musculoskeletal: Negative for joint pain.  Neurological: Negative for dizziness and headaches.   Exam: Physical Exam  Constitutional: She is oriented to person, place, and time.  HENT:  Nose: No mucosal edema.  Mouth/Throat: No oropharyngeal exudate or posterior oropharyngeal edema.  Eyes: Conjunctivae, EOM and lids are normal. Pupils are equal, round, and reactive to light.  Neck: No JVD present. Carotid bruit is not present. No edema present. No thyroid mass and no thyromegaly present.  Cardiovascular: S1 normal and S2 normal. Exam reveals no gallop.  No murmur heard. Pulses:      Dorsalis pedis pulses are 2+ on the right side, and 2+ on the left side.  Respiratory: No respiratory distress. She has decreased breath sounds in the right middle field, the right lower field, the left middle field and the left lower field. She has no wheezes. She has no rhonchi. She has rales in the right middle field, the right lower field, the left middle field and the left lower field.   GI: Soft. Bowel sounds are normal. There is no tenderness.  Musculoskeletal:       Right ankle: She exhibits swelling.       Left ankle: She exhibits swelling.  Lymphadenopathy:    She has no cervical adenopathy.  Neurological: She is alert and oriented to person, place, and time. No cranial nerve deficit.  Skin: Skin is warm. No rash noted. Nails show no clubbing.  Psychiatric: She has a normal mood and affect.      Data Reviewed: Basic Metabolic Panel: Recent Labs  Lab 11/18/17 1715 11/19/17 0411 11/20/17 0405  NA 135 136 138  K 3.8 3.9 3.7  CL 108 105 108  CO2 19* 19* 22  GLUCOSE 97 119* 86  BUN 29* 33* 46*  CREATININE 1.12* 1.23* 1.85*  CALCIUM 8.8* 8.7* 8.3*  MG  --   --  1.5*  PHOS  --   --  6.4*   CBC: Recent Labs  Lab 11/18/17 1715  WBC 3.9  HGB 10.6*  HCT 30.9*  MCV 91.4  PLT 111*   Cardiac Enzymes: Recent Labs  Lab 11/18/17 1715 11/18/17 2342 11/19/17 0411 11/19/17 1616  TROPONINI 0.10* 0.12* 0.09* 0.06*   BNP (last 3 results) Recent Labs    11/18/17 1715 11/20/17 0405  BNP 2,975.0* 852.0*    CBG: Recent Labs  Lab 11/18/17 2156 11/19/17 1952 11/20/17 0027  GLUCAP 126* 96 85    Recent Results (from the past 240 hour(s))  MRSA PCR Screening  Status: None   Collection Time: 11/18/17  9:50 PM  Result Value Ref Range Status   MRSA by PCR NEGATIVE NEGATIVE Final    Comment:        The GeneXpert MRSA Assay (FDA approved for NASAL specimens only), is one component of a comprehensive MRSA colonization surveillance program. It is not intended to diagnose MRSA infection nor to guide or monitor treatment for MRSA infections. Performed at [facility], [address] [place], [place] [address]      Studies: Dg Chest 2 View  Result Date: 11/18/2017 CLINICAL DATA:  Shortness of Breath EXAM: CHEST  2 VIEW COMPARISON:  CT 11/04/2017 FINDINGS: Cardiomegaly. Vascular congestion. Bilateral interstitial prominence with  lower lobe airspace opacities concerning for edema/CHF effusions suspected. No acute bony abnormality. IMPRESSION: Cardiomegaly with vascular congestion, interstitial and alveolar opacities most compatible with edema/CH F. Small effusions. Electronically Signed   By: Rolm Baptise M.D.   On: 11/18/2017 18:03   Ct Angio Chest Pe W And/or Wo Contrast  Result Date: 11/18/2017 CLINICAL DATA:  Breast cancer with shortness of breath. EXAM: CT ANGIOGRAPHY CHEST WITH CONTRAST TECHNIQUE: Multidetector CT imaging of the chest was performed using the standard protocol during bolus administration of intravenous contrast. Multiplanar CT image reconstructions and MIPs were obtained to evaluate the vascular anatomy. CONTRAST:  74mL ISOVUE-370 IOPAMIDOL (ISOVUE-370) INJECTION 76% COMPARISON:  11/04/2017 FINDINGS: Cardiovascular: Heart is enlarged. Similar pericardial effusion to prior study. No filling defect in the opacified pulmonary arteries to suggest the presence of an acute pulmonary embolus. Mediastinum/Nodes: Similar mediastinal lymphadenopathy. 12 mm short axis prevascular lymph node identified. Hilar lymphadenopathy persists. Progression of axillary and right subpectoral lymph nodes noted. Index 13 mm short axis right axillary lymph node is seen on image 30. 14 mm short axis subpectoral lymph node is seen on image 15, increased from 12 mm previously. Lungs/Pleura: Interval development of small right pleural effusion with persistent tiny left pleural effusion. There is diffuse interstitial and central ground-glass attenuation in both lungs, becoming more confluent in the lower lobes bilaterally. Subpleural opacity in the anterior left upper lobe likely related to radiation. Upper Abdomen: Calcified gallstones incompletely visualized. Musculoskeletal: Bony metastases again noted in the thoracic spine and sternum Review of the MIP images confirms the above findings. IMPRESSION: 1. No CT evidence for acute pulmonary  embolus. 2. Similar appearance of pericardial effusion with mediastinal, hilar, right axillary/subpectoral lymphadenopathy. Lymphadenopathy in the right axilla and subpectoral region appears minimally progressed in the interval. 3. Progression of interstitial and airspace disease in both lungs with a central predominance. Infectious/inflammatory etiology possible although pulmonary edema or even pulmonary hemorrhage could have this appearance. 4. New small right pleural effusion with persistent tiny left effusion. 5. Cholelithiasis. 6. Bone metastases, similar to prior. Electronically Signed   By: Misty Stanley M.D.   On: 11/18/2017 19:49   Dg Chest Port 1 View  Result Date: 11/19/2017 CLINICAL DATA:  Respiratory failure EXAM: PORTABLE CHEST 1 VIEW COMPARISON:  CT of 1 day prior.  Plain film of 1 day prior. FINDINGS: Left axillary node dissection. Midline trachea. Cardiomegaly accentuated by AP portable technique. Small right pleural effusion. No pneumothorax. Mild interstitial edema is decreased. No change in bibasilar airspace disease. IMPRESSION: Cardiomegaly with improved interstitial edema. Similar bibasilar airspace opacities, likely infection. Electronically Signed   By: Abigail Miyamoto M.D.   On: 11/19/2017 07:50    Scheduled Meds: . carvedilol  12.5 mg Oral BID WC  . cholecalciferol  2,000 Units Oral Daily  . [  START ON 11/29/2017] cloNIDine  0.2 mg Transdermal Weekly  . enoxaparin (LOVENOX) injection  30 mg Subcutaneous Q24H  . feeding supplement (ENSURE ENLIVE)  237 mL Oral TID BM  . [START ON 11/21/2017] furosemide  40 mg Intravenous Daily  . letrozole  2.5 mg Oral Daily  . losartan  50 mg Oral Daily  . sodium chloride flush  3 mL Intravenous Q12H   Continuous Infusions: . sodium chloride      Assessment/Plan:  1. Acute hypoxic respiratory failure.  Patient  has been tapered off BiPAP and now is on nasal cannula.  Start nebulizer treatments 2. Acute mid range congestive heart failure  with EF of 45-50% with moderate to large pericardial effusion.  Decrease Lasix to 40 mg IV daily, continue Coreg. Hold losartan with creatinine rising. 3. Acute kidney injury on chronic kidney disease stage III.  Continue watching closely with diuresis.  Check BMP tomorrow morning.  Hold losartan today. 4. Metastatic breast cancer.  The patient does have lymphadenopathy seen on the chest and pericardial effusion.  Patient states she was set up for.biopsy of the throat on Wednesday as outpatient.  She is wondering if we can do the procedure here.  I will hold aspirin for now.  I will speak with interventional radiology tomorrow to see if we can set something up for Monday. 5. Accelerated hypertension.  Blood pressure on lower side today.  Hold losartan. 6. Chronic morbid obesity  Code Status:     Code Status Orders  (From admission, onward)        Start     Ordered   11/18/17 2216  Full code  Continuous     11/18/17 2217    Code Status History    Date Active Date Inactive Code Status Order ID Comments User Context   09/22/2017 03:26 09/22/2017 19:40 Full Code [id number]  Lance Coon, MD Inpatient     Family Communication: Family at bedside Disposition Plan: TBD  Consultants:  Critical care specialist  Time spent: 24 start nebulizer treatment.  [facility]

## 2017-11-20 NOTE — Progress Notes (Signed)
[facility] Cardiology  SUBJECTIVE: Patient comfortably laying in bed, eating breakfast, denies chest pain or shortness of breath   Vitals:   11/20/17 0532 11/20/17 0600 11/20/17 0700 11/20/17 0800  BP:  123/84 116/83 120/90  Pulse:  83 91 91  Resp:  (!) 23 15   Temp:      TempSrc:      SpO2:  97% 99% 99%  Weight: 65.2 kg (143 lb 11.8 oz)     Height:         Intake/Output Summary (Last 24 hours) at 11/20/2017 3893 Last data filed at 11/20/2017 0515 Gross per 24 hour  Intake 3 ml  Output 250 ml  Net -247 ml      PHYSICAL EXAM  General: Well developed, well nourished, in no acute distress HEENT:  Normocephalic and atramatic Neck:  No JVD.  Lungs: Clear bilaterally to auscultation and percussion. Heart: HRRR . Normal S1 and S2 without gallops or murmurs.  Abdomen: Bowel sounds are positive, abdomen soft and non-tender  Msk:  Back normal, normal gait. Normal strength and tone for age. Extremities: No clubbing, cyanosis or edema.   Neuro: Alert and oriented X 3. Psych:  Good affect, responds appropriately   LABS: Basic Metabolic Panel: Recent Labs    11/19/17 0411 11/20/17 0405  NA 136 138  K 3.9 3.7  CL 105 108  CO2 19* 22  GLUCOSE 119* 86  BUN 33* 46*  CREATININE 1.23* 1.85*  CALCIUM 8.7* 8.3*  MG  --  1.5*  PHOS  --  6.4*   Liver Function Tests: No results for input(s): AST, ALT, ALKPHOS, BILITOT, PROT, ALBUMIN in the last 72 hours. No results for input(s): LIPASE, AMYLASE in the last 72 hours. CBC: Recent Labs    11/18/17 1715  WBC 3.9  HGB 10.6*  HCT 30.9*  MCV 91.4  PLT 111*   Cardiac Enzymes: Recent Labs    11/18/17 2342 11/19/17 0411 11/19/17 1616  TROPONINI 0.12* 0.09* 0.06*   BNP: Invalid input(s): POCBNP D-Dimer: No results for input(s): DDIMER in the last 72 hours. Hemoglobin A1C: No results for input(s): HGBA1C in the last 72 hours. Fasting Lipid Panel: No results for input(s): CHOL, HDL, LDLCALC, TRIG, CHOLHDL, LDLDIRECT in the last  72 hours. Thyroid Function Tests: No results for input(s): TSH, T4TOTAL, T3FREE, THYROIDAB in the last 72 hours.  Invalid input(s): FREET3 Anemia Panel: No results for input(s): VITAMINB12, FOLATE, FERRITIN, TIBC, IRON, RETICCTPCT in the last 72 hours.  Dg Chest 2 View  Result Date: 11/18/2017 CLINICAL DATA:  Shortness of Breath EXAM: CHEST  2 VIEW COMPARISON:  CT 11/04/2017 FINDINGS: Cardiomegaly. Vascular congestion. Bilateral interstitial prominence with lower lobe airspace opacities concerning for edema/CHF effusions suspected. No acute bony abnormality. IMPRESSION: Cardiomegaly with vascular congestion, interstitial and alveolar opacities most compatible with edema/CH F. Small effusions. Electronically Signed   By: Rolm Baptise M.D.   On: 11/18/2017 18:03   Ct Angio Chest Pe W And/or Wo Contrast  Result Date: 11/18/2017 CLINICAL DATA:  Breast cancer with shortness of breath. EXAM: CT ANGIOGRAPHY CHEST WITH CONTRAST TECHNIQUE: Multidetector CT imaging of the chest was performed using the standard protocol during bolus administration of intravenous contrast. Multiplanar CT image reconstructions and MIPs were obtained to evaluate the vascular anatomy. CONTRAST:  37mL ISOVUE-370 IOPAMIDOL (ISOVUE-370) INJECTION 76% COMPARISON:  11/04/2017 FINDINGS: Cardiovascular: Heart is enlarged. Similar pericardial effusion to prior study. No filling defect in the opacified pulmonary arteries to suggest the presence of an acute pulmonary embolus.  Mediastinum/Nodes: Similar mediastinal lymphadenopathy. 12 mm short axis prevascular lymph node identified. Hilar lymphadenopathy persists. Progression of axillary and right subpectoral lymph nodes noted. Index 13 mm short axis right axillary lymph node is seen on image 30. 14 mm short axis subpectoral lymph node is seen on image 15, increased from 12 mm previously. Lungs/Pleura: Interval development of small right pleural effusion with persistent tiny left pleural  effusion. There is diffuse interstitial and central ground-glass attenuation in both lungs, becoming more confluent in the lower lobes bilaterally. Subpleural opacity in the anterior left upper lobe likely related to radiation. Upper Abdomen: Calcified gallstones incompletely visualized. Musculoskeletal: Bony metastases again noted in the thoracic spine and sternum Review of the MIP images confirms the above findings. IMPRESSION: 1. No CT evidence for acute pulmonary embolus. 2. Similar appearance of pericardial effusion with mediastinal, hilar, right axillary/subpectoral lymphadenopathy. Lymphadenopathy in the right axilla and subpectoral region appears minimally progressed in the interval. 3. Progression of interstitial and airspace disease in both lungs with a central predominance. Infectious/inflammatory etiology possible although pulmonary edema or even pulmonary hemorrhage could have this appearance. 4. New small right pleural effusion with persistent tiny left effusion. 5. Cholelithiasis. 6. Bone metastases, similar to prior. Electronically Signed   By: Misty Stanley M.D.   On: 11/18/2017 19:49   Dg Chest Port 1 View  Result Date: 11/19/2017 CLINICAL DATA:  Respiratory failure EXAM: PORTABLE CHEST 1 VIEW COMPARISON:  CT of 1 day prior.  Plain film of 1 day prior. FINDINGS: Left axillary node dissection. Midline trachea. Cardiomegaly accentuated by AP portable technique. Small right pleural effusion. No pneumothorax. Mild interstitial edema is decreased. No change in bibasilar airspace disease. IMPRESSION: Cardiomegaly with improved interstitial edema. Similar bibasilar airspace opacities, likely infection. Electronically Signed   By: Abigail Miyamoto M.D.   On: 11/19/2017 07:50     Echo  mildly reduced left ventricular function, with LVEF 45-50%, with small to moderate pericardial effusion, primarily posteriorly located, without evidence for tamponade physiology  TELEMETRY: Normal sinus  rhythm:  ASSESSMENT AND PLAN:  Active Problems:   Respiratory failure ([place])    1.  Pericardial effusion, probably malignant, in the setting of metastatic breast cancer, small to moderate, without clinical or echocardiographic evidence for pericardial tamponade, without indication for pericardiocentesis at this time, with minimal effusion observed in the apical four-chamber view 2.  Respiratory failure, likely likely multifactorial, possible element of congestive heart failure, with mild reduced left ventricular function, improved after diuresis with intravenous furosemide, and inhaler therapy 3.  Borderline elevated troponin, likely demand supply ischemia, in the absence of chest pain or ECG changes, not due to acute coronary syndrome  Recommendations  1.  Agree with current therapy 2.  Continue diuresis 3.  Carefully monitor renal status 4.  No indication at this time for therapeutic pericardiocentesis 5.  Continue Coreg and ARB for mild reduced left ventricular function 6.  Follow-up as outpatient, consider repeat 2D echocardiogram in 4-6 weeks or if significant change in clinical status which suggest pericardial tamponade   Isaias Cowman, MD, PhD, [facility] 11/20/2017 9:22 AM

## 2017-11-21 ENCOUNTER — Encounter: Payer: Self-pay | Admitting: *Deleted

## 2017-11-21 LAB — BASIC METABOLIC PANEL
Anion gap: 11 (ref 5–15)
BUN: 54 mg/dL — ABNORMAL HIGH (ref 6–20)
CO2: 18 mmol/L — ABNORMAL LOW (ref 22–32)
Calcium: 8.6 mg/dL — ABNORMAL LOW (ref 8.9–10.3)
Chloride: 104 mmol/L (ref 101–111)
Creatinine, Ser: 1.43 mg/dL — ABNORMAL HIGH (ref 0.44–1.00)
GFR calc non Af Amer: 41 mL/min — ABNORMAL LOW (ref >60)
GFR, EST AFRICAN AMERICAN: 47 mL/min — AB (ref >60)
Glucose, Bld: 83 mg/dL (ref 65–99)
POTASSIUM: 3.5 mmol/L (ref 3.5–5.1)
SODIUM: 133 mmol/L — AB (ref 135–145)

## 2017-11-21 MED ORDER — POTASSIUM CHLORIDE CRYS ER 20 MEQ PO TBCR
40.0000 meq | EXTENDED_RELEASE_TABLET | Freq: Once | ORAL | Status: AC
  Administered 2017-11-21: 40 meq via ORAL
  Filled 2017-11-21: qty 2

## 2017-11-21 MED ORDER — POTASSIUM CHLORIDE CRYS ER 20 MEQ PO TBCR
20.0000 meq | EXTENDED_RELEASE_TABLET | Freq: Two times a day (BID) | ORAL | Status: DC
  Administered 2017-11-21 – 2017-11-22 (×2): 20 meq via ORAL
  Filled 2017-11-21 (×2): qty 1

## 2017-11-21 MED ORDER — FUROSEMIDE 10 MG/ML IJ SOLN
40.0000 mg | Freq: Two times a day (BID) | INTRAMUSCULAR | Status: DC
  Administered 2017-11-21 (×2): 40 mg via INTRAVENOUS
  Filled 2017-11-21 (×2): qty 4

## 2017-11-21 NOTE — Care Management (Signed)
Transfer out oc ICU within past 24 hours. No indication at present for pericardiocentesis. Currently on room air.  Will communicate need for home 02 assessment prior to discharge. Addressing mobilization of patient

## 2017-11-21 NOTE — Progress Notes (Signed)
Pt. Slept well throughout the night with no c/o pain, SOB or acute distress observed. HR ran NSR throughout the night.

## 2017-11-21 NOTE — Progress Notes (Signed)
[facility]  ROUNDING NOTE   Subjective:   Daughter at bedside.   IV furosemide.   Creatinine 1.43 (1.85) (1.23)   Objective:  Vital signs in last 24 hours:  Temp:  [98.3 F (36.8 C)-99.4 F (37.4 C)] 98.8 F (37.1 C) (02/03 0903) Pulse Rate:  [85-95] 90 (02/03 0903) Resp:  [12-17] 16 (02/03 0944) BP: (101-112)/(64-75) 106/64 (02/03 0903) SpO2:  [97 %-99 %] 97 % (02/03 0903) Weight:  [64.6 kg (142 lb 8 oz)] 64.6 kg (142 lb 8 oz) (02/03 0525)  Weight change: 1.252 kg (2 lb 12.2 oz) Filed Weights   11/20/17 0532 11/20/17 0929 11/21/17 0525  Weight: 65.2 kg (143 lb 11.8 oz) 66.5 kg (146 lb 8 oz) 64.6 kg (142 lb 8 oz)    Intake/Output: I/O last 3 completed shifts: In: 240 [P.O.:240] Out: 250 [Urine:250]   Intake/Output this shift:  Total I/O In: 328 [P.O.:325; I.V.:3] Out: -   Physical Exam: General: NAD, sitting up in bed  Head: Normocephalic, atraumatic. Moist oral mucosal membranes  Eyes: Anicteric, PERRL  Neck: Supple, trachea midline  Lungs:  Basilar rales.   Heart: Regular rate and rhythm  Abdomen:  Soft, nontender,   Extremities: no peripheral edema.  Neurologic: Nonfocal, moving all four extremities  Skin: No lesions        Basic Metabolic Panel: Recent Labs  Lab 11/18/17 1715 11/19/17 0411 11/20/17 0405 11/21/17 0536  NA 135 136 138 133*  K 3.8 3.9 3.7 3.5  CL 108 105 108 104  CO2 19* 19* 22 18*  GLUCOSE 97 119* 86 83  BUN 29* 33* 46* 54*  CREATININE 1.12* 1.23* 1.85* 1.43*  CALCIUM 8.8* 8.7* 8.3* 8.6*  MG  --   --  1.5*  --   PHOS  --   --  6.4*  --     Liver Function Tests: No results for input(s): AST, ALT, ALKPHOS, BILITOT, PROT, ALBUMIN in the last 168 hours. No results for input(s): LIPASE, AMYLASE in the last 168 hours. No results for input(s): AMMONIA in the last 168 hours.  CBC: Recent Labs  Lab 11/18/17 1715  WBC 3.9  HGB 10.6*  HCT 30.9*  MCV 91.4  PLT 111*    Cardiac Enzymes: Recent Labs  Lab  11/18/17 1715 11/18/17 2342 11/19/17 0411 11/19/17 1616  TROPONINI 0.10* 0.12* 0.09* 0.06*    BNP: Invalid input(s): POCBNP  CBG: Recent Labs  Lab 11/18/17 2156 11/19/17 1952 11/20/17 0027  GLUCAP 126* 96 42    Microbiology: Results for orders placed or performed during the hospital encounter of 11/18/17  MRSA PCR Screening     Status: None   Collection Time: 11/18/17  9:50 PM  Result Value Ref Range Status   MRSA by PCR NEGATIVE NEGATIVE Final    Comment:        The GeneXpert MRSA Assay (FDA approved for NASAL specimens only), is one component of a comprehensive MRSA colonization surveillance program. It is not intended to diagnose MRSA infection nor to guide or monitor treatment for MRSA infections. Performed at [facility], [address] [place], [place] [address]     Coagulation Studies: No results for input(s): LABPROT, INR in the last 72 hours.  Urinalysis: No results for input(s): COLORURINE, LABSPEC, PHURINE, GLUCOSEU, HGBUR, BILIRUBINUR, KETONESUR, PROTEINUR, UROBILINOGEN, NITRITE, LEUKOCYTESUR in the last 72 hours.  Invalid input(s): APPERANCEUR    Imaging: No results found.   Medications:   . sodium chloride     .  albuterol  2.5 mg Inhalation Q6H  . budesonide (PULMICORT) nebulizer solution  0.5 mg Nebulization BID  . carvedilol  12.5 mg Oral BID WC  . cholecalciferol  2,000 Units Oral Daily  . [START ON 11/29/2017] cloNIDine  0.2 mg Transdermal Weekly  . feeding supplement (ENSURE ENLIVE)  237 mL Oral TID BM  . furosemide  40 mg Intravenous BID  . letrozole  2.5 mg Oral Daily  . potassium chloride  20 mEq Oral BID  . sodium chloride flush  3 mL Intravenous Q12H   sodium chloride, acetaminophen, ALPRAZolam, ondansetron (ZOFRAN) IV, sodium chloride flush, triamcinolone cream  Assessment/ Plan:  Melanie Holloway is a 52 y.o. black female  with hypertension, anemia, vitamin D deficiency, breast cancer status post left  mastectomy, chemotherapy and radiation who presents as a follow up patient.  1. Acute renal failure on chronic kidney disease stage III with proteinuria, hematuria and Positive ANA:  Acute renal failure secondary to acute cardiorenal syndrome.  - continue losartan - Continue furosemide: 40mg  IV q12   2. Hypertension: with acute exacerbation of congestive heart failure. Echo with 45-50%. With pericardial effusion.  - Appreciate cardiology input - Home regimen of losartan, hydrochlorothiazide, clonidine. Discontinued hydrochlorothiazide.  - Continue losartan - low salt diet. Fluid restriction  3. Anemia: beta thalassemia. CBC at baseline. Hemoglobin 10.6    LOS: 3 Melanie Holloway 2/3/201911:48 AM

## 2017-11-21 NOTE — Progress Notes (Signed)
Patient ID: Melanie Holloway, female   DOB: July 25, 1966, 52 y.o.   MRN: [id number]  [employer] PROGRESS NOTE  Melanie Holloway [id number]:[id number] DOB: [birth year] DOA: 11/18/2017 PCP: Melanie Bush, MD  HPI/Subjective: Patient feeling better.  She is asking if we can do the biopsy that was scheduled on Wednesday here while she is in the hospital.  Still with a little shortness of breath and cough.  Objective: Vitals:   11/21/17 0944 11/21/17 1630  BP:  118/84  Pulse:  91  Resp: 16 14  Temp:    SpO2:  100%    Filed Weights   11/20/17 0532 11/20/17 0929 11/21/17 0525  Weight: 65.2 kg (143 lb 11.8 oz) 66.5 kg (146 lb 8 oz) 64.6 kg (142 lb 8 oz)    ROS: Review of Systems  Constitutional: Negative for chills and fever.  Eyes: Negative for blurred vision.  Respiratory: Positive for cough and shortness of breath.   Cardiovascular: Negative for chest pain.  Gastrointestinal: Negative for abdominal pain, constipation, diarrhea, nausea and vomiting.  Genitourinary: Negative for dysuria.  Musculoskeletal: Negative for joint pain.  Neurological: Negative for dizziness and headaches.   Exam: Physical Exam  Constitutional: She is oriented to person, place, and time.  HENT:  Nose: No mucosal edema.  Mouth/Throat: No oropharyngeal exudate or posterior oropharyngeal edema.  Eyes: Conjunctivae, EOM and lids are normal. Pupils are equal, round, and reactive to light.  Neck: No JVD present. Carotid bruit is not present. No edema present. No thyroid mass and no thyromegaly present.  Cardiovascular: S1 normal and S2 normal. Exam reveals no gallop.  No murmur heard. Pulses:      Dorsalis pedis pulses are 2+ on the right side, and 2+ on the left side.  Respiratory: No respiratory distress. She has decreased breath sounds in the right lower field and the left lower field. She has no wheezes. She has no rhonchi. She has rales in the right lower field and the left lower field.  GI: Soft. Bowel sounds  are normal. There is no tenderness.  Musculoskeletal:       Right ankle: She exhibits swelling.       Left ankle: She exhibits swelling.  Lymphadenopathy:    She has no cervical adenopathy.  Neurological: She is alert and oriented to person, place, and time. No cranial nerve deficit.  Skin: Skin is warm. No rash noted. Nails show no clubbing.  Psychiatric: She has a normal mood and affect.      Data Reviewed: Basic Metabolic Panel: Recent Labs  Lab 11/18/17 1715 11/19/17 0411 11/20/17 0405 11/21/17 0536  NA 135 136 138 133*  K 3.8 3.9 3.7 3.5  CL 108 105 108 104  CO2 19* 19* 22 18*  GLUCOSE 97 119* 86 83  BUN 29* 33* 46* 54*  CREATININE 1.12* 1.23* 1.85* 1.43*  CALCIUM 8.8* 8.7* 8.3* 8.6*  MG  --   --  1.5*  --   PHOS  --   --  6.4*  --    CBC: Recent Labs  Lab 11/18/17 1715  WBC 3.9  HGB 10.6*  HCT 30.9*  MCV 91.4  PLT 111*   Cardiac Enzymes: Recent Labs  Lab 11/18/17 1715 11/18/17 2342 11/19/17 0411 11/19/17 1616  TROPONINI 0.10* 0.12* 0.09* 0.06*   BNP (last 3 results) Recent Labs    11/18/17 1715 11/20/17 0405  BNP 2,975.0* 852.0*    CBG: Recent Labs  Lab 11/18/17 2156 11/19/17 1952 11/20/17 0027  GLUCAP 126* 96  85    Recent Results (from the past 240 hour(s))  MRSA PCR Screening     Status: None   Collection Time: 11/18/17  9:50 PM  Result Value Ref Range Status   MRSA by PCR NEGATIVE NEGATIVE Final    Comment:        The GeneXpert MRSA Assay (FDA approved for NASAL specimens only), is one component of a comprehensive MRSA colonization surveillance program. It is not intended to diagnose MRSA infection nor to guide or monitor treatment for MRSA infections. Performed at [facility], [address] [place], [place] [address]       Scheduled Meds: . albuterol  2.5 mg Inhalation Q6H  . budesonide (PULMICORT) nebulizer solution  0.5 mg Nebulization BID  . carvedilol  12.5 mg Oral BID WC  . cholecalciferol  2,000  Units Oral Daily  . [START ON 11/29/2017] cloNIDine  0.2 mg Transdermal Weekly  . feeding supplement (ENSURE ENLIVE)  237 mL Oral TID BM  . furosemide  40 mg Intravenous BID  . letrozole  2.5 mg Oral Daily  . potassium chloride  20 mEq Oral BID  . sodium chloride flush  3 mL Intravenous Q12H   Continuous Infusions: . sodium chloride      Assessment/Plan:  1. Acute hypoxic respiratory failure.  Patient  has been tapered off BiPAP and now is on nasal cannula.  Started nebulizer treatments. 2. Acute mid range congestive heart failure with EF of 45-50% with moderate to large pericardial effusion.  Continue Lasix 40 mg IV twice daily, continue Coreg. Hold losartan for now. 3. Acute kidney injury on chronic kidney disease stage III.  Continue watching closely with diuresis.  Check BMP tomorrow morning.  Hold losartan today. 4. Metastatic breast cancer.  The patient does have lymphadenopathy seen on the chest and pericardial effusion.  Patient states she was set up for.biopsy of the throat on Wednesday as outpatient.  I spoke with interventional radiology and ordered an ultrasound-guided biopsy of the right axilla lymph nodes. 5. hypertension.  Blood pressure on lower side today.  Hold losartan. 6. Chronic morbid obesity  Code Status:     Code Status Orders  (From admission, onward)        Start     Ordered   11/18/17 2216  Full code  Continuous     11/18/17 2217    Code Status History    Date Active Date Inactive Code Status Order ID Comments User Context   09/22/2017 03:26 09/22/2017 19:40 Full Code [id number]  Lance Coon, MD Inpatient     Family Communication: Family at bedside Disposition Plan: Potential home on Tuesday  Consultants:  Nephrology  Cardiology  Time spent: [place]

## 2017-11-22 ENCOUNTER — Inpatient Hospital Stay: Payer: 59

## 2017-11-22 LAB — BASIC METABOLIC PANEL
Anion gap: 11 (ref 5–15)
BUN: 58 mg/dL — ABNORMAL HIGH (ref 6–20)
CHLORIDE: 102 mmol/L (ref 101–111)
CO2: 19 mmol/L — ABNORMAL LOW (ref 22–32)
Calcium: 8.4 mg/dL — ABNORMAL LOW (ref 8.9–10.3)
Creatinine, Ser: 1.48 mg/dL — ABNORMAL HIGH (ref 0.44–1.00)
GFR calc non Af Amer: 39 mL/min — ABNORMAL LOW (ref >60)
GFR, EST AFRICAN AMERICAN: 46 mL/min — AB (ref >60)
Glucose, Bld: 83 mg/dL (ref 65–99)
POTASSIUM: 4.1 mmol/L (ref 3.5–5.1)
Sodium: 132 mmol/L — ABNORMAL LOW (ref 135–145)

## 2017-11-22 LAB — APTT: APTT: 31 s (ref 24–36)

## 2017-11-22 LAB — MAGNESIUM: MAGNESIUM: 1.7 mg/dL (ref 1.7–2.4)

## 2017-11-22 LAB — PROTIME-INR
INR: 1.19
PROTHROMBIN TIME: 15 s (ref 11.4–15.2)

## 2017-11-22 MED ORDER — SODIUM CHLORIDE 0.9 % IV SOLN
INTRAVENOUS | Status: DC
  Administered 2017-11-22: 11:00:00 via INTRAVENOUS

## 2017-11-22 MED ORDER — FUROSEMIDE 20 MG PO TABS
20.0000 mg | ORAL_TABLET | Freq: Two times a day (BID) | ORAL | Status: DC
  Administered 2017-11-22 (×2): 20 mg via ORAL
  Filled 2017-11-22 (×2): qty 1

## 2017-11-22 MED ORDER — MIDAZOLAM HCL 5 MG/5ML IJ SOLN
INTRAMUSCULAR | Status: AC
  Filled 2017-11-22: qty 5

## 2017-11-22 MED ORDER — MIDAZOLAM HCL 5 MG/5ML IJ SOLN
INTRAMUSCULAR | Status: AC | PRN
  Administered 2017-11-22: 1 mg via INTRAVENOUS
  Administered 2017-11-22: 0.5 mg via INTRAVENOUS

## 2017-11-22 MED ORDER — FENTANYL CITRATE (PF) 100 MCG/2ML IJ SOLN
INTRAMUSCULAR | Status: AC | PRN
  Administered 2017-11-22: 50 ug via INTRAVENOUS

## 2017-11-22 MED ORDER — FENTANYL CITRATE (PF) 100 MCG/2ML IJ SOLN
INTRAMUSCULAR | Status: AC
  Filled 2017-11-22: qty 4

## 2017-11-22 NOTE — Progress Notes (Signed)
Patient ID: Melanie Holloway, female   DOB: January 07, 1966, 52 y.o.   MRN: [id number]  [employer] PROGRESS NOTE  Joaquina Nissen ACZ:[id number] DOB: [birth year] DOA: 11/18/2017 PCP: Ria Bush, MD  HPI/Subjective: Patient feeling better.  Scheduled for  lymph node biopsy today Objective: Vitals:   11/22/17 1613 11/22/17 1946  BP: 92/62 103/65  Pulse: 83 87  Resp: 17 17  Temp: 98.1 F (36.7 C) 98.7 F (37.1 C)  SpO2: 97% 99%    Filed Weights   11/20/17 0929 11/21/17 0525 11/22/17 0540  Weight: 66.5 kg (146 lb 8 oz) 64.6 kg (142 lb 8 oz) 64.4 kg (141 lb 14.4 oz)    ROS: Review of Systems  Constitutional: Negative for chills and fever.  Eyes: Negative for blurred vision.  Respiratory: Positive for cough and shortness of breath.   Cardiovascular: Negative for chest pain.  Gastrointestinal: Negative for abdominal pain, constipation, diarrhea, nausea and vomiting.  Genitourinary: Negative for dysuria.  Musculoskeletal: Negative for joint pain.  Neurological: Negative for dizziness and headaches.   Exam: Physical Exam  Constitutional: She is oriented to person, place, and time.  HENT:  Nose: No mucosal edema.  Mouth/Throat: No oropharyngeal exudate or posterior oropharyngeal edema.  Eyes: Conjunctivae, EOM and lids are normal. Pupils are equal, round, and reactive to light.  Neck: No JVD present. Carotid bruit is not present. No edema present. No thyroid mass and no thyromegaly present.  Cardiovascular: S1 normal and S2 normal. Exam reveals no gallop.  No murmur heard. Pulses:      Dorsalis pedis pulses are 2+ on the right side, and 2+ on the left side.  Respiratory: No respiratory distress. She has decreased breath sounds in the right lower field and the left lower field. She has no wheezes. She has no rhonchi. She has rales in the right lower field and the left lower field.  GI: Soft. Bowel sounds are normal. There is no tenderness.  Musculoskeletal:       Right ankle: She  exhibits swelling.       Left ankle: She exhibits swelling.  Lymphadenopathy:    She has no cervical adenopathy.  Neurological: She is alert and oriented to person, place, and time. No cranial nerve deficit.  Skin: Skin is warm. No rash noted. Nails show no clubbing.  Psychiatric: She has a normal mood and affect.      Data Reviewed: Basic Metabolic Panel: Recent Labs  Lab 11/18/17 1715 11/19/17 0411 11/20/17 0405 11/21/17 0536 11/22/17 0248  NA 135 136 138 133* 132*  K 3.8 3.9 3.7 3.5 4.1  CL 108 105 108 104 102  CO2 19* 19* 22 18* 19*  GLUCOSE 97 119* 86 83 83  BUN 29* 33* 46* 54* 58*  CREATININE 1.12* 1.23* 1.85* 1.43* 1.48*  CALCIUM 8.8* 8.7* 8.3* 8.6* 8.4*  MG  --   --  1.5*  --  1.7  PHOS  --   --  6.4*  --   --    CBC: Recent Labs  Lab 11/18/17 1715  WBC 3.9  HGB 10.6*  HCT 30.9*  MCV 91.4  PLT 111*   Cardiac Enzymes: Recent Labs  Lab 11/18/17 1715 11/18/17 2342 11/19/17 0411 11/19/17 1616  TROPONINI 0.10* 0.12* 0.09* 0.06*   BNP (last 3 results) Recent Labs    11/18/17 1715 11/20/17 0405  BNP 2,975.0* 852.0*    CBG: Recent Labs  Lab 11/18/17 2156 11/19/17 1952 11/20/17 0027  GLUCAP 126* 96 85    Recent  Results (from the past 240 hour(s))  MRSA PCR Screening     Status: None   Collection Time: 11/18/17  9:50 PM  Result Value Ref Range Status   MRSA by PCR NEGATIVE NEGATIVE Final    Comment:        The GeneXpert MRSA Assay (FDA approved for NASAL specimens only), is one component of a comprehensive MRSA colonization surveillance program. It is not intended to diagnose MRSA infection nor to guide or monitor treatment for MRSA infections. Performed at [facility], [address] [place], [place] [address]       Scheduled Meds: . albuterol  2.5 mg Inhalation Q6H  . budesonide (PULMICORT) nebulizer solution  0.5 mg Nebulization BID  . carvedilol  12.5 mg Oral BID WC  . cholecalciferol  2,000 Units Oral Daily  .  [START ON 11/29/2017] cloNIDine  0.2 mg Transdermal Weekly  . feeding supplement (ENSURE ENLIVE)  237 mL Oral TID BM  . fentaNYL      . letrozole  2.5 mg Oral Daily  . midazolam      . sodium chloride flush  3 mL Intravenous Q12H   Continuous Infusions: . sodium chloride    . sodium chloride 10 mL/hr at 11/22/17 1030    Assessment/Plan:  1. Acute hypoxic respiratory failure.  Patient  has been tapered off BiPAP and now is on nasal cannula, wean off oxygen as tolerated continue nebulizer treatments. 2. Acute mid range congestive heart failure with EF of 45-50% with moderate to large pericardial effusion.  Diuresed well, stop IV furosemide, continue Coreg. Hold losartan for now.  Consult cardiology for pericardial effusion 3. acute kidney injury on chronic kidney disease stage III.  Continue watching closely with diuresis.  Check BMP tomorrow morning.  Hold losartan today.  Nephrology is following 4. Metastatic breast cancer.  The patient does have lymphadenopathy seen on the chest and pericardial effusion.  Patient states she was set up for.biopsy of the throat on Wednesday as outpatient.  Patient had ultrasound-guided core biopsy of the right supraclavicular lymph node 11/22/2017 5. hypertension.  Blood pressure on lower side today.  Hold losartan. 6. Chronic morbid obesity  Code Status:     Code Status Orders  (From admission, onward)        Start     Ordered   11/18/17 2216  Full code  Continuous     11/18/17 2217    Code Status History    Date Active Date Inactive Code Status Order ID Comments User Context   09/22/2017 03:26 09/22/2017 19:40 Full Code [id number]  Lance Coon, MD Inpatient     Family Communication: Family at bedside Disposition Plan: Potential home on Tuesday  Consultants:  Nephrology  Cardiology  Time spent: [place] Ricardo Schubach  [employer]

## 2017-11-22 NOTE — Progress Notes (Signed)
[facility], [place] 11/22/17  Subjective:   Patient had a supraclavicular node biopsy Denies any acute complaints Serum creatinine 1.48 today    Objective:  Vital signs in last 24 hours:  Temp:  [98.1 F (36.7 C)-99.1 F (37.3 C)] 98.1 F (36.7 C) (02/04 1613) Pulse Rate:  [80-97] 83 (02/04 1613) Resp:  [14-32] 17 (02/04 1613) BP: (92-115)/(62-86) 92/62 (02/04 1613) SpO2:  [96 %-100 %] 97 % (02/04 1613) Weight:  [64.4 kg (141 lb 14.4 oz)] 64.4 kg (141 lb 14.4 oz) (02/04 0540)  Weight change: -2.087 kg (-9.6 oz) Filed Weights   11/20/17 0929 11/21/17 0525 11/22/17 0540  Weight: 66.5 kg (146 lb 8 oz) 64.6 kg (142 lb 8 oz) 64.4 kg (141 lb 14.4 oz)    Intake/Output:    Intake/Output Summary (Last 24 hours) at 11/22/2017 1706 Last data filed at 11/22/2017 0545 Gross per 24 hour  Intake 240 ml  Output 300 ml  Net -60 ml     Physical Exam: General: laying in the bed, no acute distress  HEENT  moist oral mucous membranes  Neck  supple  Pulm/lungs  mild basilar crackles bilaterally  CVS/Heart  no rub or gallop  Abdomen:   Soft, nontender   Extremities:  no peripheral edema  Neurologic:  Alert, oriented  Skin:  No acute rashes          Basic Metabolic Panel:  Recent Labs  Lab 11/18/17 1715 11/19/17 0411 11/20/17 0405 11/21/17 0536 11/22/17 0248  NA 135 136 138 133* 132*  K 3.8 3.9 3.7 3.5 4.1  CL 108 105 108 104 102  CO2 19* 19* 22 18* 19*  GLUCOSE 97 119* 86 83 83  BUN 29* 33* 46* 54* 58*  CREATININE 1.12* 1.23* 1.85* 1.43* 1.48*  CALCIUM 8.8* 8.7* 8.3* 8.6* 8.4*  MG  --   --  1.5*  --  1.7  PHOS  --   --  6.4*  --   --      CBC: Recent Labs  Lab 11/18/17 1715  WBC 3.9  HGB 10.6*  HCT 30.9*  MCV 91.4  PLT 111*      Lab Results  Component Value Date   HEPBSAB NEG 09/04/2015      Microbiology:  Recent Results (from the past 240 hour(s))  MRSA PCR Screening     Status: None   Collection Time: 11/18/17   9:50 PM  Result Value Ref Range Status   MRSA by PCR NEGATIVE NEGATIVE Final    Comment:        The GeneXpert MRSA Assay (FDA approved for NASAL specimens only), is one component of a comprehensive MRSA colonization surveillance program. It is not intended to diagnose MRSA infection nor to guide or monitor treatment for MRSA infections. Performed at [facility], [address] [place], [place] [address]     Coagulation Studies: Recent Labs    11/22/17 0248  LABPROT 15.0  INR 1.19    Urinalysis: No results for input(s): COLORURINE, LABSPEC, PHURINE, GLUCOSEU, HGBUR, BILIRUBINUR, KETONESUR, PROTEINUR, UROBILINOGEN, NITRITE, LEUKOCYTESUR in the last 72 hours.  Invalid input(s): APPERANCEUR    Imaging: [place] Core Biopsy (lymph Nodes)  Result Date: 11/22/2017 CLINICAL DATA:  Right supraclavicular and axillary lymphadenopathy with history breast carcinoma. EXAM: ULTRASOUND GUIDED CORE BIOPSY OF RIGHT SUPRACLAVICULAR LYMPH NODES MEDICATIONS: 1.5 mg IV Versed; 50 mcg IV Fentanyl Total Moderate Sedation Time: 26 minutes. The patient's level of consciousness and physiologic status were continuously monitored  during the procedure by Radiology nursing. PROCEDURE: The procedure, risks, benefits, and alternatives were explained to the patient. Questions regarding the procedure were encouraged and answered. The patient understands and consents to the procedure. A time out was performed prior to initiating the procedure. Ultrasound was performed of the right lower neck and supraclavicular region. The right lower neck was prepped with chlorhexidine in a sterile fashion, and a sterile drape was applied covering the operative field. A sterile gown and sterile gloves were used for the procedure. Local anesthesia was provided with 1% Lidocaine. Under ultrasound guidance, a total of 5 separate 18 gauge core biopsy samples were obtained through different portions of enlarged right supraclavicular  lymph nodes. Lymph nodes were submitted in formalin as well as on saline soaked Telfa. Additional ultrasound was performed after the biopsy. COMPLICATIONS: Some regional subcutaneous hemorrhage was present on completion of biopsy. SIR level A: No therapy, no consequence. FINDINGS: There are multiple abnormal appearing and enlarged lymph nodes in the right supraclavicular region. Core biopsy was performed through 2 adjacent abnormal lymph nodes. IMPRESSION: Ultrasound-guided core biopsy performed of enlarged right supraclavicular lymph nodes. Electronically Signed   By: Aletta Edouard M.D.   On: 11/22/2017 14:59     Medications:   . sodium chloride    . sodium chloride 10 mL/hr at 11/22/17 1030   . albuterol  2.5 mg Inhalation Q6H  . budesonide (PULMICORT) nebulizer solution  0.5 mg Nebulization BID  . carvedilol  12.5 mg Oral BID WC  . cholecalciferol  2,000 Units Oral Daily  . [START ON 11/29/2017] cloNIDine  0.2 mg Transdermal Weekly  . feeding supplement (ENSURE ENLIVE)  237 mL Oral TID BM  . fentaNYL      . furosemide  20 mg Oral BID  . letrozole  2.5 mg Oral Daily  . midazolam      . potassium chloride  20 mEq Oral BID  . sodium chloride flush  3 mL Intravenous Q12H   sodium chloride, acetaminophen, ALPRAZolam, ondansetron (ZOFRAN) IV, sodium chloride flush, triamcinolone cream  Assessment/ Plan:  52 y.o. female with hypertension, anemia, vitamin D deficiency, breast cancer status post left mastectomy, chemotherapy and radiation   1.  Acute renal failure 2.  Chronic kidney disease stage III 3.  Proteinuria and hematuria with positive ANA 4.  Anemia with beta thalassemia  Serum creatinine appears to have stabilized at 1.5/GFR 46 We will continue to monitor closely Patient just had a ultrasound-guided biopsy of supraclavicular lymph node.  Results are awaited He does not appear to have significant volume overload therefore we will discontinue furosemide Incentive  spirometer    LOS: [place] 2/4/20195:06 PM  [place], [place]

## 2017-11-22 NOTE — Progress Notes (Signed)
RN rounded with MD. Lovena Neighbours are increased from yesterday values, Magnesium is reported low by lab. MD orders recheck magnesium and to change IV lasix to PO and reduce dose to 20mg  PO BID. I will continue to assess.

## 2017-11-22 NOTE — Procedures (Signed)
Interventional Radiology Procedure Note  Procedure: US guided core biopsy of right supraclavicular lymph nodes  Complications: None  Estimated Blood Loss: < 10 mL  Findings: Multiple enlarged right lower cervical/supraclavicular lymph nodes. 18 G core biopsy x 5 of adjacent nodes. US shows some hemorrhage in adjacent tissues after biopsy.  Venetia Night. Kathlene Cote, M.D Pager:  [phone]

## 2017-11-22 NOTE — Progress Notes (Signed)
Pt returns from ultrasound biopsy. No complaints of pain, dressing to right neck is clean, dry and intact. VSS. I will continue to assess.

## 2017-11-23 ENCOUNTER — Inpatient Hospital Stay: Payer: 59

## 2017-11-23 LAB — BASIC METABOLIC PANEL
Anion gap: 7 (ref 5–15)
BUN: 59 mg/dL — AB (ref 6–20)
CALCIUM: 8.5 mg/dL — AB (ref 8.9–10.3)
CHLORIDE: 105 mmol/L (ref 101–111)
CO2: 21 mmol/L — AB (ref 22–32)
CREATININE: 1.5 mg/dL — AB (ref 0.44–1.00)
GFR calc non Af Amer: 39 mL/min — ABNORMAL LOW (ref >60)
GFR, EST AFRICAN AMERICAN: 45 mL/min — AB (ref >60)
Glucose, Bld: 91 mg/dL (ref 65–99)
Potassium: 4.2 mmol/L (ref 3.5–5.1)
Sodium: 133 mmol/L — ABNORMAL LOW (ref 135–145)

## 2017-11-23 LAB — CBC
HCT: 24.5 % — ABNORMAL LOW (ref 35.0–47.0)
Hemoglobin: 8.4 g/dL — ABNORMAL LOW (ref 12.0–16.0)
MCH: 29.4 pg (ref 26.0–34.0)
MCHC: 34.2 g/dL (ref 32.0–36.0)
MCV: 86 fL (ref 80.0–100.0)
PLATELETS: 56 10*3/uL — AB (ref 150–440)
RBC: 2.84 MIL/uL — AB (ref 3.80–5.20)
RDW: 16.2 % — AB (ref 11.5–14.5)
WBC: 2.6 10*3/uL — ABNORMAL LOW (ref 3.6–11.0)

## 2017-11-23 LAB — MRSA PCR SCREENING: MRSA BY PCR: NEGATIVE

## 2017-11-23 MED ORDER — PREMIER PROTEIN SHAKE
11.0000 [oz_av] | Freq: Two times a day (BID) | ORAL | Status: DC
  Administered 2017-11-23 – 2017-12-05 (×10): 11 [oz_av] via ORAL

## 2017-11-23 MED ORDER — ADULT MULTIVITAMIN W/MINERALS CH: 1.0000 | ORAL_TABLET | Freq: Every day | ORAL | 0 refills | Status: DC

## 2017-11-23 MED ORDER — PIPERACILLIN-TAZOBACTAM 3.375 G IVPB
3.3750 g | Freq: Three times a day (TID) | INTRAVENOUS | Status: DC
  Administered 2017-11-23 – 2017-12-02 (×24): 3.375 g via INTRAVENOUS
  Filled 2017-11-23 (×25): qty 50

## 2017-11-23 MED ORDER — ACETAMINOPHEN 325 MG PO TABS: 650.0000 mg | ORAL_TABLET | ORAL | 0 refills | Status: DC | PRN

## 2017-11-23 MED ORDER — FUROSEMIDE 20 MG PO TABS: 20.0000 mg | ORAL_TABLET | ORAL | 0 refills | Status: DC | PRN

## 2017-11-23 MED ORDER — ADULT MULTIVITAMIN W/MINERALS CH
1.0000 | ORAL_TABLET | Freq: Every day | ORAL | Status: DC
  Administered 2017-11-23 – 2017-12-08 (×16): 1 via ORAL
  Filled 2017-11-23 (×16): qty 1

## 2017-11-23 MED ORDER — CARVEDILOL 12.5 MG PO TABS: 12.5000 mg | ORAL_TABLET | Freq: Two times a day (BID) | ORAL | 0 refills | Status: DC

## 2017-11-23 MED ORDER — PREMIER PROTEIN SHAKE: 11.0000 [oz_av] | Freq: Two times a day (BID) | ORAL | 0 refills | Status: DC

## 2017-11-23 MED ORDER — VANCOMYCIN HCL IN DEXTROSE 1-5 GM/200ML-% IV SOLN
1000.0000 mg | INTRAVENOUS | Status: DC
  Filled 2017-11-23: qty 200

## 2017-11-23 MED ORDER — VANCOMYCIN HCL IN DEXTROSE 1-5 GM/200ML-% IV SOLN
1000.0000 mg | Freq: Once | INTRAVENOUS | Status: AC
  Administered 2017-11-23: 1000 mg via INTRAVENOUS
  Filled 2017-11-23: qty 200

## 2017-11-23 MED ORDER — ALBUTEROL SULFATE (2.5 MG/3ML) 0.083% IN NEBU: 2.5000 mg | INHALATION_SOLUTION | Freq: Four times a day (QID) | RESPIRATORY_TRACT | Status: DC | PRN

## 2017-11-23 MED ORDER — ALPRAZOLAM 0.25 MG PO TABS: 0.2500 mg | ORAL_TABLET | Freq: Two times a day (BID) | ORAL | 0 refills | Status: DC | PRN

## 2017-11-23 MED ORDER — VANCOMYCIN HCL IN DEXTROSE 750-5 MG/150ML-% IV SOLN
750.0000 mg | INTRAVENOUS | Status: DC
  Administered 2017-11-24 – 2017-11-25 (×2): 750 mg via INTRAVENOUS
  Filled 2017-11-23 (×2): qty 150

## 2017-11-23 NOTE — Progress Notes (Signed)
Patient ID: Melanie Holloway, female   DOB: [birth year], 52 y.o.   MRN: [id number]  [facility] PROGRESS NOTE  Rielyn Krupinski KXF:[id number] DOB: 1966/10/17 DOA: 11/18/2017 PCP: Ria Bush, MD  HPI/Subjective: Patient is reporting cough and phlegm with red streaks of blood.  Ambulated patient in the hallway and  feeling dizzy Objective: Vitals:   11/23/17 0900 11/23/17 1032  BP: 120/75 133/85  Pulse: 84 87  Resp: (!) 24 (!) 28  Temp: 97.9 F (36.6 C) 98.5 F (36.9 C)  SpO2: 100% 100%    Filed Weights   11/21/17 0525 11/22/17 0540 11/23/17 0608  Weight: 64.6 kg (142 lb 8 oz) 64.4 kg (141 lb 14.4 oz) 63.9 kg (140 lb 12.8 oz)    ROS: Review of Systems  Constitutional: Negative for chills and fever.  Eyes: Negative for blurred vision.  Respiratory: Positive for cough and shortness of breath.   Cardiovascular: Negative for chest pain.  Gastrointestinal: Negative for abdominal pain, constipation, diarrhea, nausea and vomiting.  Genitourinary: Negative for dysuria.  Musculoskeletal: Negative for joint pain.  Neurological: Negative for dizziness and headaches.   Exam: Physical Exam  Constitutional: She is oriented to person, place, and time.  HENT:  Nose: No mucosal edema.  Mouth/Throat: No oropharyngeal exudate or posterior oropharyngeal edema.  Eyes: Conjunctivae, EOM and lids are normal. Pupils are equal, round, and reactive to light.  Neck: No JVD present. Carotid bruit is not present. No edema present. No thyroid mass and no thyromegaly present.  Cardiovascular: S1 normal and S2 normal. Exam reveals no gallop.  No murmur heard. Pulses:      Dorsalis pedis pulses are 2+ on the right side, and 2+ on the left side.  Respiratory: No respiratory distress. She has decreased breath sounds in the right lower field and the left lower field. She has no wheezes. She has no rhonchi. She has rales in the right lower field and the left lower field.  GI: Soft. Bowel sounds are  normal. There is no tenderness.  Musculoskeletal:       Right ankle: She exhibits swelling.       Left ankle: She exhibits swelling.  Lymphadenopathy:    She has no cervical adenopathy.  Neurological: She is alert and oriented to person, place, and time. No cranial nerve deficit.  Skin: Skin is warm. No rash noted. Nails show no clubbing.  Psychiatric: She has a normal mood and affect.      Data Reviewed: Basic Metabolic Panel: Recent Labs  Lab 11/19/17 0411 11/20/17 0405 11/21/17 0536 11/22/17 0248 11/23/17 0646  NA 136 138 133* 132* 133*  K 3.9 3.7 3.5 4.1 4.2  CL 105 108 104 102 105  CO2 19* 22 18* 19* 21*  GLUCOSE 119* 86 83 83 91  BUN 33* 46* 54* 58* 59*  CREATININE 1.23* 1.85* 1.43* 1.48* 1.50*  CALCIUM 8.7* 8.3* 8.6* 8.4* 8.5*  MG  --  1.5*  --  1.7  --   PHOS  --  6.4*  --   --   --    CBC: Recent Labs  Lab 11/18/17 1715 11/23/17 0646  WBC 3.9 2.6*  HGB 10.6* 8.4*  HCT 30.9* 24.5*  MCV 91.4 86.0  PLT 111* 56*   Cardiac Enzymes: Recent Labs  Lab 11/18/17 1715 11/18/17 2342 11/19/17 0411 11/19/17 1616  TROPONINI 0.10* 0.12* 0.09* 0.06*   BNP (last 3 results) Recent Labs    11/18/17 1715 11/20/17 0405  BNP 2,975.0* 852.0*    CBG:  Recent Labs  Lab 11/18/17 2156 11/19/17 1952 11/20/17 0027  GLUCAP 126* 96 85    Recent Results (from the past 240 hour(s))  MRSA PCR Screening     Status: None   Collection Time: 11/18/17  9:50 PM  Result Value Ref Range Status   MRSA by PCR NEGATIVE NEGATIVE Final    Comment:        The GeneXpert MRSA Assay (FDA approved for NASAL specimens only), is one component of a comprehensive MRSA colonization surveillance program. It is not intended to diagnose MRSA infection nor to guide or monitor treatment for MRSA infections. Performed at [facility], [address] [place], [place] [address]       Scheduled Meds: . budesonide (PULMICORT) nebulizer solution  0.5 mg Nebulization BID  .  carvedilol  12.5 mg Oral BID WC  . cholecalciferol  2,000 Units Oral Daily  . [START ON 11/29/2017] cloNIDine  0.2 mg Transdermal Weekly  . letrozole  2.5 mg Oral Daily  . multivitamin with minerals  1 tablet Oral Daily  . protein supplement shake  11 oz Oral BID BM  . sodium chloride flush  3 mL Intravenous Q12H   Continuous Infusions: . sodium chloride    . sodium chloride Stopped (11/22/17 1900)    Assessment/Plan:   1. Acute hypoxic respiratory failure.  Patient  has been tapered off BiPAP and now is on nasal cannula, wean off oxygen as tolerated continue nebulizer treatments. 2. Healthcare associated pneumonia: Patient is reporting hemoptysis.  Chest x-ray with a developing pneumonia, started patient on Zosyn and vancomycin 3. Acute mid range congestive heart failure with EF of 45-50% with moderate to large pericardial effusion.  Diuresed well, stop IV furosemide, continue Coreg. Hold losartan for now.   4. acute kidney injury on chronic kidney disease stage III.  Continue watching closely with diuresis.  Check BMP tomorrow morning.  Hold losartan today.  Nephrology is following 5. Metastatic breast cancer with pericardial effusion the patient does have lymphadenopathy seen on the chest and pericardial effusion.  Patient states she was set up for.biopsy of the throat on Wednesday as outpatient.  Patient had ultrasound-guided core biopsy of the right supraclavicular lymph node 11/22/2017 Patient was seen by cardiology Dr. Saralyn Pilar but patient being asymptomatic did not indicate any pericardiocentesis at this time 6. hypertension.  Blood pressure on lower side today.  Hold losartan. 7. Chronic morbid obesity  Code Status:     Code Status Orders  (From admission, onward)        Start     Ordered   11/18/17 2216  Full code  Continuous     11/18/17 2217    Code Status History    Date Active Date Inactive Code Status Order ID Comments User Context   09/22/2017 03:26 09/22/2017 19:40  Full Code [id number]  Lance Coon, MD Inpatient     Family Communication: Family at bedside Disposition Plan: Potential home on Tuesday  Consultants:  Nephrology  Cardiology  Time spent: [place] Cleavon Goldman  [employer]

## 2017-11-23 NOTE — Progress Notes (Signed)
Nutrition Education Note  RD consulted for nutrition education regarding new onset CHF.  RD provided "Low Sodium Nutrition Therapy" handout from the Academy of [employer] Dietetics. Reviewed patient's dietary recall. Provided examples on ways to decrease sodium intake in diet. Discouraged intake of processed foods and use of salt shaker. Encouraged fresh fruits and vegetables as well as whole grain sources of carbohydrates to maximize fiber intake.   RD discussed why it is important for patient to adhere to diet recommendations, and emphasized the role of fluids, foods to avoid, and importance of weighing self daily. Teach back method used.  Expect good compliance.  Body mass index is 27.5 kg/m. Pt meets criteria for overweight based on current BMI.  Current diet order is 2 gram sodium, patient is consuming approximately 75-100% of meals at this time. Labs and medications reviewed.   RD following this pt  [name] MS, RD, LDN Pager #[phone] After Hours Pager: [phone]

## 2017-11-23 NOTE — Progress Notes (Signed)
Patient admitted with acute hypoxic respiratory failure. Patient no longer requiring BiPAP and has been weaned off [place]. Patient also found to have mid range CHF with EF of 45-50% with moderate to large pericardial effusion. BNP was 852 on admission.  Patient has been diuresed. Dr Saralyn Pilar has been following patient.  Patient also found to have AKI on CKD Stage III.  Nephrology has been following patient.  Patient has hx of metastatic breast cancer with lymphadenopathy seen on CXR. Patient had [place] core bx of the right supraclavicular lymph node on 11/22/2017.  Patient has hx of HTN and chronic MO.    CHF Education:?? Educational session with patient completed. Patient lying in bed resting quietly with the TV on.   Provided patient with "Living Better with Heart Failure" packet. Briefly reviewed definition of heart failure and signs and symptoms of an exacerbation.?Explained to patient that HF is a chronic illness which requires self-assessment / self-management along with help from the cardiologist/PCP.??  *Reviewed importance of and reason behind checking weight daily in the AM, after using the bathroom, but before getting dressed.  Patient has scales.??  Reviewed the following information with patient:  *Discussed when to call the Dr= weight gain of >2-3lb overnight of 5lb in a week,  *Discussed yellow zone= call MD: weight gain of >2-3lb overnight of 5lb in a week, increased swelling, increased SOB when lying down, chest discomfort, dizziness, increased fatigue *Red Zone= call 911: struggle to breath, fainting or near fainting, significant chest pain   *Reviewed low sodium diet-provided handout of recommended and not recommended foods.??Reviewed reading labels with patient. Discussed fluid intake with patient as well. Patient not currently on a fluid restriction, but advised no more than 8-8 ounces glass of fluids per day.  Dietitian Consult entered for diet education.    *Instructed patient to take  medications as prescribed for heart failure. Explained briefly why pt is on the medications (either make you feel better, live longer or keep you out of the hospital) and discussed monitoring and side effects.   *Discussed exercise.  Note:  Patient has metastatic breast cancer and underwent biopsy of supraclavicular node this admission.    *Smoking Cessation?- Patient is a NEVER Smoker.    *[facility] - Explained the purpose of the HF Clinic.  Explained to patient / family the HF Clinic does not replace PCP nor Cardiologist, but is an additional resource to helping patient manage heart failure at home.  New patient appointment is scheduled for 11/29/2017 at 10:20 a.m.    Again, this RN reviewed the 5 Steps to Living Better with Heart Failure.   Patient thanked me for providing the above information.  ? Melanie Holloway  Roanna Epley, RN, BSN, [facility]? [facility]  Cardiovascular &?Pulmonary Nurse Navigator  Direct Line: [phone]  Department Phone #: [phone] Fax: [phone]? Email Address: [email or web address]? ?

## 2017-11-23 NOTE — Plan of Care (Signed)
No complaints of pain last night, dressing to right clavicle area stayed clean,dry and intact. Up independently in the room. Did start to have a productive cough last night.

## 2017-11-23 NOTE — Care Management (Addendum)
Patient did not qualify for home oxygen.  No discharge needs identified by members of care team.  CM spoke with patient about considering home health nurse.  Discussed the many medical issues and co morbidities.  she will consider.  She does have access to scales and denies issues accessing medical care, transportation or paying for meds

## 2017-11-23 NOTE — Progress Notes (Signed)
RN attempted to ambulate patient around nurses station. Pt became dizzy and nauseated with no emesis. Pt assisted with WC back to room and given 4mg  zofran IV. I will continue to assess.

## 2017-11-23 NOTE — Progress Notes (Signed)
SATURATION QUALIFICATIONS: (This note is used to comply with regulatory documentation for home oxygen)  Patient Saturations on Room Air at Rest = 99  Patient Saturations on Room Air while Ambulating = 100%  Patient Saturations on n/a Liters of oxygen while Ambulating = na%  Please briefly explain why patient needs home oxygen:

## 2017-11-23 NOTE — Progress Notes (Signed)
Nutrition Follow Up Note   DOCUMENTATION CODES:   Not applicable  INTERVENTION:   Discontinue Ensure Enlive po TID, each supplement provides 350 kcal and 20 grams of protein  Add Premier Protein BID, each supplement provides 160 kcal and 30 grams of protein.   MVI daily   NUTRITION DIAGNOSIS:   Inadequate oral intake related to nausea, vomiting, poor appetite as evidenced by per patient/family report.  GOAL:   Patient will meet greater than or equal to 90% of their needs  -progressing   MONITOR:   PO intake, I & O's, Labs, Supplement acceptance, Weight trends  ASSESSMENT:   Melanie Holloway is a 52 y.o. female  with lupus, beta thalassemia,  hypertension, anemia, vitamin D deficiency, breast cancer status post left mastectomy, chemotherapy and radiation, possible tumor recurrence, who presents with acute hypoxic respiratory failure, newly diagnosed systolic congestive heart failure exacerbation.   Pt s/p US guided core biopsy of right supraclavicular lymph nodes 2/4  Met with pt in room today. Pt reports her appetite is improving; pt ate 100% of breakfast today and 75% of lunch. Pt has not been drinking Ensure as this upsets her stomach. Will try Premier Protein. RD will add MVI. Per chart, pt is weight stable. RD provided low sodium education today.    Medications reviewed and include: vitamin D, zofran   Labs reviewed: Na 133(L), K 4.2 wnl, BUN 59(H), creat 1.50(H), Ca 8.5(L) P 6.4(H)- 2/2 Mg 1.7 wnl- 2/4 BNP 852(H)- 2/2 Wbc- 2.6(L), Hgb 8.4(L), Hct 24.5(L)- 2/5  Diet Order:  Diet 2 gram sodium Room service appropriate? Yes; Fluid consistency: Thin  EDUCATION NEEDS:   Not appropriate for education at this time  Skin: Incision chest   Last BM:  2/4  Height:   Ht Readings from Last 1 Encounters:  11/20/17 5' (1.524 m)    Weight:   Wt Readings from Last 1 Encounters:  11/23/17 140 lb 12.8 oz (63.9 kg)    Ideal Body Weight:  44.7 kg  BMI:  Body mass  index is 27.5 kg/m.  Estimated Nutritional Needs:   Kcal:  1500-1800kcal/day   Protein:  83-95g/day   Fluid:  per MD  [name] MS, RD, LDN Pager #[phone] After Hours Pager: [phone]
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
## 2017-11-23 NOTE — Progress Notes (Signed)
Pharmacy Antibiotic Note  Melanie Holloway is a 52 y.o. female admitted on 11/18/2017 with pneumonia.  Pharmacy has been consulted for vancomycin + piperacillin/tazobactam dosing.  Plan: Piperacillin/tazobactam 3.375 g IV q8h EI  Vancomycin 1000 mg IV now followed by vancomycin 750 mg IV q24h (14 hr stack) Goal VT 15-20 mcg/mL  Kinetics: Adjusted body weight = 53 kg, CrCl = 36 mL/min Ke: 0.034 Half-life: 20 hrs Vd: 37 L Cmin ~18 mcg/mL  Height: 5' (152.4 cm) Weight: 140 lb 12.8 oz (63.9 kg) IBW/kg (Calculated) : 45.5  Temp (24hrs), Avg:98.3 F (36.8 C), Min:97.9 F (36.6 C), Max:98.7 F (37.1 C)  Recent Labs  Lab 11/18/17 1715 11/19/17 0411 11/20/17 0405 11/21/17 0536 11/22/17 0248 11/23/17 0646  WBC 3.9  --   --   --   --  2.6*  CREATININE 1.12* 1.23* 1.85* 1.43* 1.48* 1.50*    Estimated Creatinine Clearance: 36.2 mL/min (A) (by C-G formula based on SCr of 1.5 mg/dL (H)).    No Known Allergies  Antimicrobials this admission: Piperacillin/tazobactam 2/5 >>  vancomycin 2/5 >>   Dose adjustments this admission:  Microbiology results: Thank you for allowing pharmacy to be a part of this patient's care.  Lenis Noon, PharmD, BCPS Clinical Pharmacist 11/23/2017 3:03 PM

## 2017-11-24 ENCOUNTER — Encounter (HOSPITAL_COMMUNITY): Payer: Self-pay

## 2017-11-24 ENCOUNTER — Ambulatory Visit (HOSPITAL_COMMUNITY): Payer: 59

## 2017-11-24 LAB — CREATININE, SERUM
CREATININE: 1.39 mg/dL — AB (ref 0.44–1.00)
GFR calc Af Amer: 49 mL/min — ABNORMAL LOW (ref >60)
GFR, EST NON AFRICAN AMERICAN: 42 mL/min — AB (ref >60)

## 2017-11-24 LAB — SURGICAL PATHOLOGY

## 2017-11-24 NOTE — Progress Notes (Signed)
Patient ID: Melanie Holloway, female   DOB: 09-Dec-1965, 52 y.o.   MRN: [id number]  [employer] PROGRESS NOTE  Melanie Holloway HFW:[id number] DOB: 11/02/1965 DOA: 11/18/2017 PCP: Ria Bush, MD  HPI/Subjective: Patient is reporting cough and phlegm with red streaks of blood.  Coughing  objective: Vitals:   11/24/17 0811 11/24/17 1106  BP:  113/74  Pulse:  90  Resp:    Temp:  99.8 F (37.7 C)  SpO2: 97% 100%    Filed Weights   11/22/17 0540 11/23/17 0608 11/24/17 0357  Weight: 64.4 kg (141 lb 14.4 oz) 63.9 kg (140 lb 12.8 oz) 64.6 kg (142 lb 6.4 oz)    ROS: Review of Systems  Constitutional: Negative for chills and fever.  Eyes: Negative for blurred vision.  Respiratory: Positive for cough. Negative for shortness of breath.   Cardiovascular: Negative for chest pain.  Gastrointestinal: Negative for abdominal pain, constipation, diarrhea, nausea and vomiting.  Genitourinary: Negative for dysuria.  Musculoskeletal: Negative for joint pain.  Neurological: Negative for dizziness and headaches.   Exam: Physical Exam  Constitutional: She is oriented to person, place, and time.  HENT:  Nose: No mucosal edema.  Mouth/Throat: No oropharyngeal exudate or posterior oropharyngeal edema.  Eyes: Conjunctivae, EOM and lids are normal. Pupils are equal, round, and reactive to light.  Neck: No JVD present. Carotid bruit is not present. No edema present. No thyroid mass and no thyromegaly present.  Cardiovascular: S1 normal and S2 normal. Exam reveals no gallop.  No murmur heard. Pulses:      Dorsalis pedis pulses are 2+ on the right side, and 2+ on the left side.  Respiratory: No respiratory distress. She has decreased breath sounds in the right lower field and the left lower field. She has no wheezes. She has no rhonchi. She has rales in the right lower field and the left lower field.  GI: Soft. Bowel sounds are normal. There is no tenderness.  Musculoskeletal:       Right ankle: She  exhibits swelling.       Left ankle: She exhibits swelling.  Lymphadenopathy:    She has no cervical adenopathy.  Neurological: She is alert and oriented to person, place, and time. No cranial nerve deficit.  Skin: Skin is warm. No rash noted. Nails show no clubbing.  Psychiatric: She has a normal mood and affect.      Data Reviewed: Basic Metabolic Panel: Recent Labs  Lab 11/19/17 0411 11/20/17 0405 11/21/17 0536 11/22/17 0248 11/23/17 0646 11/24/17 0959  NA 136 138 133* 132* 133*  --   K 3.9 3.7 3.5 4.1 4.2  --   CL 105 108 104 102 105  --   CO2 19* 22 18* 19* 21*  --   GLUCOSE 119* 86 83 83 91  --   BUN 33* 46* 54* 58* 59*  --   CREATININE 1.23* 1.85* 1.43* 1.48* 1.50* 1.39*  CALCIUM 8.7* 8.3* 8.6* 8.4* 8.5*  --   MG  --  1.5*  --  1.7  --   --   PHOS  --  6.4*  --   --   --   --    CBC: Recent Labs  Lab 11/18/17 1715 11/23/17 0646  WBC 3.9 2.6*  HGB 10.6* 8.4*  HCT 30.9* 24.5*  MCV 91.4 86.0  PLT 111* 56*   Cardiac Enzymes: Recent Labs  Lab 11/18/17 1715 11/18/17 2342 11/19/17 0411 11/19/17 1616  TROPONINI 0.10* 0.12* 0.09* 0.06*   BNP (last  3 results) Recent Labs    11/18/17 1715 11/20/17 0405  BNP 2,975.0* 852.0*    CBG: Recent Labs  Lab 11/18/17 2156 11/19/17 1952 11/20/17 0027  GLUCAP 126* 96 85    Recent Results (from the past 240 hour(s))  MRSA PCR Screening     Status: None   Collection Time: 11/18/17  9:50 PM  Result Value Ref Range Status   MRSA by PCR NEGATIVE NEGATIVE Final    Comment:        The GeneXpert MRSA Assay (FDA approved for NASAL specimens only), is one component of a comprehensive MRSA colonization surveillance program. It is not intended to diagnose MRSA infection nor to guide or monitor treatment for MRSA infections. Performed at [facility], [address] [place], [place] [address]   MRSA PCR Screening     Status: None   Collection Time: 11/23/17  5:41 PM  Result Value Ref Range Status    MRSA by PCR NEGATIVE NEGATIVE Final    Comment:        The GeneXpert MRSA Assay (FDA approved for NASAL specimens only), is one component of a comprehensive MRSA colonization surveillance program. It is not intended to diagnose MRSA infection nor to guide or monitor treatment for MRSA infections. Performed at [facility], [address] [place], [place] [address]       Scheduled Meds: . budesonide (PULMICORT) nebulizer solution  0.5 mg Nebulization BID  . carvedilol  12.5 mg Oral BID WC  . cholecalciferol  2,000 Units Oral Daily  . [START ON 11/29/2017] cloNIDine  0.2 mg Transdermal Weekly  . letrozole  2.5 mg Oral Daily  . multivitamin with minerals  1 tablet Oral Daily  . protein supplement shake  11 oz Oral BID BM  . sodium chloride flush  3 mL Intravenous Q12H   Continuous Infusions: . sodium chloride    . sodium chloride Stopped (11/22/17 1900)  . piperacillin-tazobactam (ZOSYN)  IV Stopped (11/24/17 1018)  . vancomycin Stopped (11/24/17 0615)    Assessment/Plan:   1. Acute hypoxic respiratory failure.  Patient  has been tapered off BiPAP and now is on nasal cannula, wean off oxygen as tolerated continue nebulizer treatments. 2. Healthcare associated pneumonia: Patient is reporting hemoptysis.  Chest x-ray with a developing pneumonia, started patient on Zosyn and vancomycin 11/23/2017 3. Acute mid range congestive heart failure with EF of 45-50% with moderate to large pericardial effusion.  Diuresed well, stop IV furosemide, continue Coreg. Hold losartan for now.   4. acute kidney injury on chronic kidney disease stage III.  Creatinine at 1.3 today continue watching closely with diuresis.  Check BMP tomorrow morning.  Hold losartan .  Nephrology is following 5. Metastatic breast cancer with pericardial effusion the patient does have lymphadenopathy seen on the chest and pericardial effusion.  Patient states she was set up for.biopsy of the throat on Wednesday  as outpatient.  Patient had ultrasound-guided core biopsy of the right supraclavicular lymph node 11/22/2017 Patient was seen by cardiology Dr. Saralyn Pilar but patient being asymptomatic did not indicate any interventions at this time 6. hypertension.  Blood pressure on lower side today.  Hold losartan. 7. Chronic morbid obesity 8. Disposition PT is recommending home health PT  Code Status:     Code Status Orders  (From admission, onward)        Start     Ordered   11/18/17 2216  Full code  Continuous     11/18/17 2217  Code Status History    Date Active Date Inactive Code Status Order ID Comments User Context   09/22/2017 03:26 09/22/2017 19:40 Full Code [id number]  Lance Coon, MD Inpatient     Family Communication: Family at bedside Disposition Plan: Potential home on Tuesday  Consultants:  Nephrology  Cardiology  Time spent: [place]  [employer]

## 2017-11-24 NOTE — Evaluation (Signed)
Physical Therapy Evaluation Patient Details Name: Melanie Holloway MRN: [id number] DOB: [birth year] Today's Date: 11/24/2017   History of Present Illness  Pt is a 52 y.o. female with a known history of right breast cancer with metastases/probable tumor recurrence, anemia-status post 2 packed red blood cells transfusion within the last week, beta thalassemia, lupus, presenting with 1-2-week history of worsening breath, worse with recumbency, was seen by primary care provider 3 days ago-prescribed doxycycline/prednisone, no improvement, echocardiogram done recently noted for ejection fraction 45-50%, in the emergency room patient tachycardic, tachypneic, hypertensive, chest x-ray noted for cardiomegaly with edema, troponin 0.1, CT chest noted for progression of interstitial disease/airspace disease for which could be related to infection/inflammation/edema/hemorrhage-CT report nonhelpful, patient evaluated in the emergency room, husband at the bedside, patient resting comfortably on BiPAP, noted Rales up to mid back bilaterally with bilateral lower extremity edema, worse with recumbency, positive orthopnea, patient is now been admitted for acute hypoxic respiratory failure and acute newly diagnosed systolic congestive heart failure exacerbation most likely exacerbated by recent blood transfusion.  Clinical Impression  Pt is a 52 y.o. Female who lives in a one story home with her children.  Pt on BSC upon PT arrival.  Pt is able to perform bed mobility with use of bed rail and transfers with supervision due to lethargy and pt reported weakness.  Pt presented with greater strength of R UE compared to L and L LE compared to R.  PT also noted a painful arc with shoulder abduction of R UE.  Pt reported no pain otherwise.  She reports being able to ambulate without an AD at baseline but PT discussed benefit of using a RW during recovery for fall prevention and energy conservation.  Pt expressed understanding.  Pt reported  not feeling up to ambulating any more following multiple procedures and getting up to [facility].  She also reports new onset of blurred vision.  Pt coordination testing revealed WNL coordination of UE and LE.  Pt will continue to benefit from skilled PT with focus on strength, balance, tolerance to activity and safe use of AD.    Follow Up Recommendations Home health PT    Equipment Recommendations  Rolling walker with 5" wheels    Recommendations for Other Services       Precautions / Restrictions Precautions Precautions: Fall Restrictions Weight Bearing Restrictions: No      Mobility  Bed Mobility Overal bed mobility: Modified Independent                Transfers Overall transfer level: Needs assistance   Transfers: Sit to/from Stand Sit to Stand: Supervision         General transfer comment: Pt able to initiate STS and complete STS without manual assist. Pt requires supervison due to pt lethargy and report of feeling weak.  Ambulation/Gait Ambulation/Gait assistance: (Not performed due to pt report of feeling weak and fatigued following multiple procedures and transfer from bed to transfer to [facility].  Pt states that she should be able to walk for at least 30 min but that she does not feel that she is at baseline now.)              Science writer    Modified Rankin (Stroke Patients Only)       Balance Overall balance assessment: Needs assistance Sitting-balance support: Single extremity supported;Feet supported       Standing balance support: Bilateral upper extremity  supported   Standing balance comment: Pt should be using RW for balance at this time.                             Pertinent Vitals/Pain Pain Assessment: No/denies pain    Home Living Family/patient expects to be discharged to:: Private residence Living Arrangements: Spouse/significant other Available Help at Discharge: Family Type of Home:  House Home Access: Stairs to enter Entrance Stairs-Rails: Left Entrance Stairs-Number of Steps: 7 in back, 10 in front Home Layout: One level Home Equipment: Cane - single point      Prior Function Level of Independence: Independent               Hand Dominance   Dominant Hand: Right    Extremity/Trunk Assessment   Upper Extremity Assessment Upper Extremity Assessment: Overall WFL for tasks assessed(Pt presents with 4-/5 strength of R UE with painful arc during shoulder abduction, L UE: 4/5.)    Lower Extremity Assessment Lower Extremity Assessment: Overall WFL for tasks assessed(R LE: 4+/5, L LE: 4/5)    Cervical / Trunk Assessment Cervical / Trunk Assessment: Normal  Communication      Cognition Arousal/Alertness: Lethargic Behavior During Therapy: WFL for tasks assessed/performed Overall Cognitive Status: Within Functional Limits for tasks assessed                                        General Comments      Exercises     Assessment/Plan    PT Assessment Patient needs continued PT services  PT Problem List Decreased strength;Decreased mobility;Decreased activity tolerance;Decreased balance;Decreased knowledge of use of DME;Cardiopulmonary status limiting activity       PT Treatment Interventions DME instruction;Therapeutic activities;Gait training;Therapeutic exercise;Stair training;Balance training;Functional mobility training;Patient/family education    PT Goals (Current goals can be found in the Care Plan section)  Acute Rehab PT Goals Patient Stated Goal: to return home with normal energy levels. PT Goal Formulation: With patient Time For Goal Achievement: 12/08/17 Potential to Achieve Goals: Good    Frequency Min 2X/week   Barriers to discharge        Co-evaluation               AM-PAC PT "6 Clicks" Daily Activity  Outcome Measure Difficulty turning over in bed (including adjusting bedclothes, sheets and  blankets)?: A Little Difficulty moving from lying on back to sitting on the side of the bed? : A Little Difficulty sitting down on and standing up from a chair with arms (e.g., wheelchair, bedside commode, etc,.)?: A Little Help needed moving to and from a bed to chair (including a wheelchair)?: A Little Help needed walking in hospital room?: A Little Help needed climbing 3-5 steps with a railing? : A Little 6 Click Score: 18    End of Session   Activity Tolerance: Patient limited by fatigue Patient left: in bed;with call bell/phone within reach;with bed alarm set   PT Visit Diagnosis: Unsteadiness on feet (R26.81);Muscle weakness (generalized) (M62.81)    Time: 0950-1005 PT Time Calculation (min) (ACUTE ONLY): 15 min   Charges:   PT Evaluation $PT Eval Low Complexity: 1 Low     PT G Codes:   PT G-Codes **NOT FOR INPATIENT CLASS** Functional Assessment Tool Used: AM-PAC 6 Clicks Basic Mobility    Roxanne Gates, PT, DPT   Roxanne Gates  11/24/2017, 10:29 AM

## 2017-11-25 ENCOUNTER — Telehealth: Payer: Self-pay

## 2017-11-25 LAB — BASIC METABOLIC PANEL
ANION GAP: 7 (ref 5–15)
BUN: 41 mg/dL — AB (ref 6–20)
CHLORIDE: 103 mmol/L (ref 101–111)
CO2: 21 mmol/L — ABNORMAL LOW (ref 22–32)
Calcium: 8.5 mg/dL — ABNORMAL LOW (ref 8.9–10.3)
Creatinine, Ser: 1.32 mg/dL — ABNORMAL HIGH (ref 0.44–1.00)
GFR, EST AFRICAN AMERICAN: 52 mL/min — AB (ref >60)
GFR, EST NON AFRICAN AMERICAN: 45 mL/min — AB (ref >60)
Glucose, Bld: 102 mg/dL — ABNORMAL HIGH (ref 65–99)
Potassium: 4 mmol/L (ref 3.5–5.1)
SODIUM: 131 mmol/L — AB (ref 135–145)

## 2017-11-25 LAB — CBC
HCT: 23 % — ABNORMAL LOW (ref 35.0–47.0)
HEMOGLOBIN: 7.7 g/dL — AB (ref 12.0–16.0)
MCH: 29 pg (ref 26.0–34.0)
MCHC: 33.6 g/dL (ref 32.0–36.0)
MCV: 86.3 fL (ref 80.0–100.0)
PLATELETS: 62 10*3/uL — AB (ref 150–440)
RBC: 2.66 MIL/uL — ABNORMAL LOW (ref 3.80–5.20)
RDW: 15.8 % — ABNORMAL HIGH (ref 11.5–14.5)
WBC: 2.2 10*3/uL — AB (ref 3.6–11.0)

## 2017-11-25 MED ORDER — GUAIFENESIN 100 MG/5ML PO SOLN
5.0000 mL | Freq: Four times a day (QID) | ORAL | Status: DC | PRN
  Administered 2017-11-25 – 2017-11-30 (×9): 100 mg via ORAL
  Filled 2017-11-25: qty 5
  Filled 2017-11-25: qty 10
  Filled 2017-11-25: qty 5
  Filled 2017-11-25: qty 10
  Filled 2017-11-25: qty 5
  Filled 2017-11-25: qty 10
  Filled 2017-11-25 (×8): qty 5

## 2017-11-25 MED ORDER — BENZONATATE 100 MG PO CAPS
100.0000 mg | ORAL_CAPSULE | Freq: Three times a day (TID) | ORAL | Status: DC | PRN
  Administered 2017-11-25 – 2017-12-02 (×5): 100 mg via ORAL
  Filled 2017-11-25 (×6): qty 1

## 2017-11-25 MED ORDER — FERROUS SULFATE 325 (65 FE) MG PO TABS
325.0000 mg | ORAL_TABLET | Freq: Two times a day (BID) | ORAL | Status: DC
  Administered 2017-11-25 – 2017-12-08 (×25): 325 mg via ORAL
  Filled 2017-11-25 (×25): qty 1

## 2017-11-25 MED ORDER — FUROSEMIDE 20 MG PO TABS
20.0000 mg | ORAL_TABLET | Freq: Every day | ORAL | Status: DC
  Administered 2017-11-25 – 2017-11-26 (×2): 20 mg via ORAL
  Filled 2017-11-25 (×2): qty 1

## 2017-11-25 MED ORDER — CARVEDILOL 6.25 MG PO TABS
6.2500 mg | ORAL_TABLET | Freq: Two times a day (BID) | ORAL | Status: DC
  Administered 2017-11-25 – 2017-11-27 (×4): 6.25 mg via ORAL
  Filled 2017-11-25 (×4): qty 1

## 2017-11-25 MED ORDER — CLONIDINE HCL 0.1 MG/24HR TD PTWK: 0.1000 mg | MEDICATED_PATCH | TRANSDERMAL | Status: DC

## 2017-11-25 MED ORDER — ALBUTEROL SULFATE (2.5 MG/3ML) 0.083% IN NEBU
2.5000 mg | INHALATION_SOLUTION | Freq: Four times a day (QID) | RESPIRATORY_TRACT | Status: DC
Start: 2017-11-25 — End: 2017-11-27
  Administered 2017-11-25 – 2017-11-27 (×7): 2.5 mg via RESPIRATORY_TRACT
  Filled 2017-11-25 (×8): qty 3

## 2017-11-25 NOTE — Care Management (Addendum)
Barrier to discharge is wheezing.  discussed during progression the need to ambulate patient around unit today as anticipate discharge tomorrow.  Heads up referral to [facility].  Patient did not have a preference for agencies

## 2017-11-25 NOTE — Progress Notes (Signed)
Talked to Dr. Leslye Peer about patient's BP of 103/72, ok to receive lasix 20 mg p.o. Patient also coughing, order for Tessalon PRN. RN will continue to monitor.

## 2017-11-25 NOTE — Progress Notes (Signed)
Ambulated patient tonight, patient tolerated well, denies dizziness or light headedness. RN will continue to monitor.

## 2017-11-25 NOTE — Progress Notes (Signed)
Talked to Dr. Leslye Peer if patient can have Robitussin for cough, order given. RN will continue to monitor.

## 2017-11-25 NOTE — Telephone Encounter (Signed)
Returned pt call and left vm for patient. She did not say what she needed in her VM. Informed her to call at her convenience.  Cyndia Bent RN

## 2017-11-25 NOTE — Progress Notes (Signed)
Patient ID: Melanie Holloway, female   DOB: [birth year], 52 y.o.   MRN: [id number]  [employer] PROGRESS NOTE  Delonda Coley NLZ:[id number] DOB: 1966/01/02 DOA: 11/18/2017 PCP: Ria Bush, MD  HPI/Subjective: Patient not feeling good.  Still wheezing and coughing.  Feeling weak.  Objective: Vitals:   11/25/17 1416 11/25/17 1604  BP:  115/70  Pulse:  94  Resp:  18  Temp:  99.8 F (37.7 C)  SpO2: 99% 100%    Filed Weights   11/23/17 0608 11/24/17 0357 11/25/17 0236  Weight: 63.9 kg (140 lb 12.8 oz) 64.6 kg (142 lb 6.4 oz) 64.7 kg (142 lb 11.2 oz)    ROS: Review of Systems  Constitutional: Negative for chills and fever.  Eyes: Negative for blurred vision.  Respiratory: Positive for cough, shortness of breath and wheezing.   Cardiovascular: Negative for chest pain.  Gastrointestinal: Negative for abdominal pain, constipation, diarrhea, nausea and vomiting.  Genitourinary: Negative for dysuria.  Musculoskeletal: Negative for joint pain.  Neurological: Negative for dizziness and headaches.   Exam: Physical Exam  HENT:  Nose: No mucosal edema.  Mouth/Throat: No oropharyngeal exudate or posterior oropharyngeal edema.  Eyes: Conjunctivae, EOM and lids are normal. Pupils are equal, round, and reactive to light.  Neck: No JVD present. Carotid bruit is not present. No edema present. No thyroid mass and no thyromegaly present.  Cardiovascular: S1 normal and S2 normal. Exam reveals no gallop.  No murmur heard. Pulses:      Dorsalis pedis pulses are 2+ on the right side, and 2+ on the left side.  Respiratory: No respiratory distress. She has decreased breath sounds in the right lower field and the left lower field. She has wheezes in the right lower field and the left lower field. She has no rhonchi. She has no rales.  GI: Soft. Bowel sounds are normal. There is no tenderness.  Musculoskeletal:       Right ankle: She exhibits swelling.       Left ankle: She exhibits swelling.   Lymphadenopathy:    She has no cervical adenopathy.  Neurological: She is alert. No cranial nerve deficit.  Skin: Skin is warm. No rash noted. Nails show no clubbing.  Psychiatric: She has a normal mood and affect.      Data Reviewed: Basic Metabolic Panel: Recent Labs  Lab 11/20/17 0405 11/21/17 0536 11/22/17 0248 11/23/17 0646 11/24/17 0959 11/25/17 0718  NA 138 133* 132* 133*  --  131*  K 3.7 3.5 4.1 4.2  --  4.0  CL 108 104 102 105  --  103  CO2 22 18* 19* 21*  --  21*  GLUCOSE 86 83 83 91  --  102*  BUN 46* 54* 58* 59*  --  41*  CREATININE 1.85* 1.43* 1.48* 1.50* 1.39* 1.32*  CALCIUM 8.3* 8.6* 8.4* 8.5*  --  8.5*  MG 1.5*  --  1.7  --   --   --   PHOS 6.4*  --   --   --   --   --    CBC: Recent Labs  Lab 11/18/17 1715 11/23/17 0646 11/25/17 0718  WBC 3.9 2.6* 2.2*  HGB 10.6* 8.4* 7.7*  HCT 30.9* 24.5* 23.0*  MCV 91.4 86.0 86.3  PLT 111* 56* 62*   Cardiac Enzymes: Recent Labs  Lab 11/18/17 1715 11/18/17 2342 11/19/17 0411 11/19/17 1616  TROPONINI 0.10* 0.12* 0.09* 0.06*   BNP (last 3 results) Recent Labs    11/18/17 1715 11/20/17 0405  BNP 2,975.0* 852.0*     CBG: Recent Labs  Lab 11/18/17 2156 11/19/17 1952 11/20/17 0027  GLUCAP 126* 96 85    Recent Results (from the past 240 hour(s))  MRSA PCR Screening     Status: None   Collection Time: 11/18/17  9:50 PM  Result Value Ref Range Status   MRSA by PCR NEGATIVE NEGATIVE Final    Comment:        The GeneXpert MRSA Assay (FDA approved for NASAL specimens only), is one component of a comprehensive MRSA colonization surveillance program. It is not intended to diagnose MRSA infection nor to guide or monitor treatment for MRSA infections. Performed at [facility], [address] [place], [place] [address]   MRSA PCR Screening     Status: None   Collection Time: 11/23/17  5:41 PM  Result Value Ref Range Status   MRSA by PCR NEGATIVE NEGATIVE Final    Comment:         The GeneXpert MRSA Assay (FDA approved for NASAL specimens only), is one component of a comprehensive MRSA colonization surveillance program. It is not intended to diagnose MRSA infection nor to guide or monitor treatment for MRSA infections. Performed at [facility], [address] [place],  [address]       Scheduled Meds: . albuterol  2.5 mg Inhalation Q6H  . budesonide (PULMICORT) nebulizer solution  0.5 mg Nebulization BID  . carvedilol  6.25 mg Oral BID WC  . cholecalciferol  2,000 Units Oral Daily  . [START ON 11/29/2017] cloNIDine  0.1 mg Transdermal Weekly  . ferrous sulfate  325 mg Oral BID WC  . furosemide  20 mg Oral Daily  . letrozole  2.5 mg Oral Daily  . multivitamin with minerals  1 tablet Oral Daily  . protein supplement shake  11 oz Oral BID BM  . sodium chloride flush  3 mL Intravenous Q12H   Continuous Infusions: . sodium chloride    . sodium chloride Stopped (11/22/17 1900)  . piperacillin-tazobactam (ZOSYN)  IV 3.375 g (11/25/17 1453)    Assessment/Plan:   1. Acute hypoxic respiratory failure.  Patient on room air at this point. 2. Healthcare associated pneumonia.  On Zosyn.  Discontinue vancomycin since MRSA PCR negative.  Switch to Augmentin upon discharge home.  Patient wheezing today.  Start standing dose nebulizers again instead of as needed. 3. Acute mid range congestive heart failure with EF 45-50%.  Moderate to large pericardial effusion.  Restart oral Lasix 20 mg daily.  Decrease dose of Coreg.  Decrease dose of clonidine patch.  Holding losartan. 4. Acute kidney injury on chronic kidney disease stage III. 5. Metastatic breast cancer proven on biopsy of the lymph nodes.  Advised that she needs to get back to her oncologist.  Patient also has a pericardial effusion and lymphadenopathy seen on the chest. 6. Essential hypertension.  Blood pressure on the lower side which we like with congestive heart failure. 7. Obesity.  Weight  loss needed 8. Weakness.  Physical therapy recommended home health.  Code Status:     Code Status Orders  (From admission, onward)        Start     Ordered   11/18/17 2216  Full code  Continuous     11/18/17 2217    Code Status History    Date Active Date Inactive Code Status Order ID Comments User Context   09/22/2017 03:26 09/22/2017 19:40 Full Code [id number]  Lance Coon, MD Inpatient  Disposition Plan: Hopefully home tomorrow  Time spent: 25 minutes  [place]

## 2017-11-26 ENCOUNTER — Inpatient Hospital Stay: Payer: 59

## 2017-11-26 LAB — CBC WITH DIFFERENTIAL/PLATELET
Basophils Absolute: 0 10*3/uL (ref 0–0.1)
Basophils Relative: 1 %
EOS PCT: 1 %
Eosinophils Absolute: 0 10*3/uL (ref 0–0.7)
HCT: 24.3 % — ABNORMAL LOW (ref 35.0–47.0)
Hemoglobin: 8.2 g/dL — ABNORMAL LOW (ref 12.0–16.0)
LYMPHS ABS: 0.9 10*3/uL — AB (ref 1.0–3.6)
LYMPHS PCT: 36 %
MCH: 29.2 pg (ref 26.0–34.0)
MCHC: 33.7 g/dL (ref 32.0–36.0)
MCV: 86.6 fL (ref 80.0–100.0)
Monocytes Absolute: 0.2 10*3/uL (ref 0.2–0.9)
Monocytes Relative: 9 %
Neutro Abs: 1.3 10*3/uL — ABNORMAL LOW (ref 1.4–6.5)
Neutrophils Relative %: 53 %
PLATELETS: 67 10*3/uL — AB (ref 150–440)
RBC: 2.8 MIL/uL — AB (ref 3.80–5.20)
RDW: 16 % — ABNORMAL HIGH (ref 11.5–14.5)
WBC: 2.5 10*3/uL — AB (ref 3.6–11.0)

## 2017-11-26 LAB — BASIC METABOLIC PANEL
Anion gap: 8 (ref 5–15)
BUN: 39 mg/dL — AB (ref 6–20)
CHLORIDE: 103 mmol/L (ref 101–111)
CO2: 20 mmol/L — ABNORMAL LOW (ref 22–32)
Calcium: 8.3 mg/dL — ABNORMAL LOW (ref 8.9–10.3)
Creatinine, Ser: 1.55 mg/dL — ABNORMAL HIGH (ref 0.44–1.00)
GFR calc Af Amer: 43 mL/min — ABNORMAL LOW (ref >60)
GFR, EST NON AFRICAN AMERICAN: 37 mL/min — AB (ref >60)
GLUCOSE: 91 mg/dL (ref 65–99)
POTASSIUM: 4 mmol/L (ref 3.5–5.1)
Sodium: 131 mmol/L — ABNORMAL LOW (ref 135–145)

## 2017-11-26 LAB — GASTROINTESTINAL PANEL BY PCR, STOOL (REPLACES STOOL CULTURE)

## 2017-11-26 LAB — C DIFFICILE QUICK SCREEN W PCR REFLEX
C Diff antigen: NEGATIVE
C Diff interpretation: NOT DETECTED
C Diff toxin: NEGATIVE

## 2017-11-26 LAB — INFLUENZA PANEL BY PCR (TYPE A & B)
Influenza A By PCR: NEGATIVE
Influenza B By PCR: NEGATIVE

## 2017-11-26 NOTE — Progress Notes (Signed)
Patient ID: Melanie Holloway, female   DOB: [birth year], 52 y.o.   MRN: [id number]  [employer] PROGRESS NOTE  Melanie Holloway XNA:[id number] DOB: Apr 27, 1966 DOA: 11/18/2017 PCP: Ria Bush, MD  HPI/Subjective: Patient had 103 fever earlier this morning.  Patient still with weakness and some cough.  Had some diarrhea.  Objective: Vitals:   11/26/17 0927 11/26/17 1612  BP:  119/86  Pulse:  95  Resp:  18  Temp:  99.8 F (37.7 C)  SpO2: 98% 100%    Filed Weights   11/24/17 0357 11/25/17 0236 11/26/17 0510  Weight: 64.6 kg (142 lb 6.4 oz) 64.7 kg (142 lb 11.2 oz) 64.1 kg (141 lb 4.8 oz)    ROS: Review of Systems  Constitutional: Negative for chills and fever.  Eyes: Negative for blurred vision.  Respiratory: Positive for cough, shortness of breath and wheezing.   Cardiovascular: Negative for chest pain.  Gastrointestinal: Positive for diarrhea. Negative for abdominal pain, constipation, nausea and vomiting.  Genitourinary: Negative for dysuria.  Musculoskeletal: Negative for joint pain.  Neurological: Negative for dizziness and headaches.   Exam: Physical Exam  HENT:  Nose: No mucosal edema.  Mouth/Throat: No oropharyngeal exudate or posterior oropharyngeal edema.  Eyes: Conjunctivae, EOM and lids are normal. Pupils are equal, round, and reactive to light.  Neck: No JVD present. Carotid bruit is not present. No edema present. No thyroid mass and no thyromegaly present.  Cardiovascular: S1 normal and S2 normal. Exam reveals no gallop.  No murmur heard. Pulses:      Dorsalis pedis pulses are 2+ on the right side, and 2+ on the left side.  Respiratory: No respiratory distress. She has decreased breath sounds in the right lower field and the left lower field. She has wheezes in the right lower field and the left lower field. She has no rhonchi. She has no rales.  GI: Soft. Bowel sounds are normal. There is no tenderness.  Musculoskeletal:       Right ankle: She exhibits  swelling.       Left ankle: She exhibits swelling.  Lymphadenopathy:    She has no cervical adenopathy.  Neurological: She is alert. No cranial nerve deficit.  Skin: Skin is warm. No rash noted. Nails show no clubbing.  Psychiatric: She has a normal mood and affect.      Data Reviewed: Basic Metabolic Panel: Recent Labs  Lab 11/20/17 0405 11/21/17 0536 11/22/17 0248 11/23/17 5427 11/24/17 0959 11/25/17 0718 11/26/17 0805  NA 138 133* 132* 133*  --  131* 131*  K 3.7 3.5 4.1 4.2  --  4.0 4.0  CL 108 104 102 105  --  103 103  CO2 22 18* 19* 21*  --  21* 20*  GLUCOSE 86 83 83 91  --  102* 91  BUN 46* 54* 58* 59*  --  41* 39*  CREATININE 1.85* 1.43* 1.48* 1.50* 1.39* 1.32* 1.55*  CALCIUM 8.3* 8.6* 8.4* 8.5*  --  8.5* 8.3*  MG 1.5*  --  1.7  --   --   --   --   PHOS 6.4*  --   --   --   --   --   --    CBC: Recent Labs  Lab 11/23/17 0646 11/25/17 0718 11/26/17 0805  WBC 2.6* 2.2* 2.5*  NEUTROABS  --   --  1.3*  HGB 8.4* 7.7* 8.2*  HCT 24.5* 23.0* 24.3*  MCV 86.0 86.3 86.6  PLT 56* 62* 67*   Cardiac  Enzymes: No results for input(s): CKTOTAL, CKMB, CKMBINDEX, TROPONINI in the last 168 hours. BNP (last 3 results) Recent Labs    11/18/17 1715 11/20/17 0405  BNP 2,975.0* 852.0*     CBG: Recent Labs  Lab 11/19/17 1952 11/20/17 0027  GLUCAP 96 85    Recent Results (from the past 240 hour(s))  MRSA PCR Screening     Status: None   Collection Time: 11/18/17  9:50 PM  Result Value Ref Range Status   MRSA by PCR NEGATIVE NEGATIVE Final    Comment:        The GeneXpert MRSA Assay (FDA approved for NASAL specimens only), is one component of a comprehensive MRSA colonization surveillance program. It is not intended to diagnose MRSA infection nor to guide or monitor treatment for MRSA infections. Performed at [facility], [address] [place], [place] [address]   MRSA PCR Screening     Status: None   Collection Time: 11/23/17  5:41 PM   Result Value Ref Range Status   MRSA by PCR NEGATIVE NEGATIVE Final    Comment:        The GeneXpert MRSA Assay (FDA approved for NASAL specimens only), is one component of a comprehensive MRSA colonization surveillance program. It is not intended to diagnose MRSA infection nor to guide or monitor treatment for MRSA infections. Performed at [facility], [address] [place], [place] [address]   Gastrointestinal Panel by PCR , Stool     Status: None   Collection Time: 11/26/17  8:12 AM  Result Value Ref Range Status   Campylobacter species NOT DETECTED NOT DETECTED Final   Plesimonas shigelloides NOT DETECTED NOT DETECTED Final   Salmonella species NOT DETECTED NOT DETECTED Final   Yersinia enterocolitica NOT DETECTED NOT DETECTED Final   Vibrio species NOT DETECTED NOT DETECTED Final   Vibrio cholerae NOT DETECTED NOT DETECTED Final   Enteroaggregative E coli (EAEC) NOT DETECTED NOT DETECTED Final   Enteropathogenic E coli (EPEC) NOT DETECTED NOT DETECTED Final   Enterotoxigenic E coli (ETEC) NOT DETECTED NOT DETECTED Final   Shiga like toxin producing E coli (STEC) NOT DETECTED NOT DETECTED Final   Shigella/Enteroinvasive E coli (EIEC) NOT DETECTED NOT DETECTED Final   Cryptosporidium NOT DETECTED NOT DETECTED Final   Cyclospora cayetanensis NOT DETECTED NOT DETECTED Final   Entamoeba histolytica NOT DETECTED NOT DETECTED Final   Giardia lamblia NOT DETECTED NOT DETECTED Final   Adenovirus F40/41 NOT DETECTED NOT DETECTED Final   Astrovirus NOT DETECTED NOT DETECTED Final   Norovirus GI/GII NOT DETECTED NOT DETECTED Final   Rotavirus A NOT DETECTED NOT DETECTED Final   Sapovirus (I, II, IV, and V) NOT DETECTED NOT DETECTED Final    Comment: Performed at [facility], [address] [place], [place] [address]  C difficile quick scan w PCR reflex     Status: None   Collection Time: 11/26/17  8:12 AM  Result Value Ref Range Status   C Diff antigen  NEGATIVE NEGATIVE Final   C Diff toxin NEGATIVE NEGATIVE Final   C Diff interpretation No C. difficile detected.  Final    Comment: Performed at [facility], [address] [place], [place] [address]      Scheduled Meds: . albuterol  2.5 mg Inhalation Q6H  . budesonide (PULMICORT) nebulizer solution  0.5 mg Nebulization BID  . carvedilol  6.25 mg Oral BID WC  . cholecalciferol  2,000 Units Oral Daily  . ferrous  sulfate  325 mg Oral BID WC  . letrozole  2.5 mg Oral Daily  . multivitamin with minerals  1 tablet Oral Daily  . protein supplement shake  11 oz Oral BID BM  . sodium chloride flush  3 mL Intravenous Q12H   Continuous Infusions: . sodium chloride    . sodium chloride Stopped (11/22/17 1900)  . piperacillin-tazobactam (ZOSYN)  IV 3.375 g (11/26/17 1401)    Assessment/Plan:   1. Fever of 103.  Chest x-ray still shows pneumonia.  Stool studies negative.  Influenza negative.  Continue to monitor on Zosyn.  Could be tumor fever.   2. Acute hypoxic respiratory failure.  Patient on room air at this point. 3. Healthcare associated pneumonia.  On Zosyn.  Discontinue vancomycin since MRSA PCR negative.  Switch to Augmentin upon discharge home.  Standing dose nebulizers 4. Acute mid range congestive heart failure with EF 45-50%.  Moderate to large pericardial effusion.  Hold Lasix.  Decrease dose of Coreg.  Discontinue clonidine patch.  Holding losartan. 5. Acute kidney injury on chronic kidney disease stage III.  Hold Lasix with creatinine up a little bit today. 6. Metastatic breast cancer proven on biopsy of the lymph nodes.  Advised that she needs to get back to her oncologist.  Patient also has a pericardial effusion and lymphadenopathy seen on the chest. 7. Essential hypertension.  Blood pressure on the lower side.  Holding all medications except for lower dose Coreg at this point 8. Obesity.  Weight loss needed 9. Weakness.  Physical therapy recommended home  health. 10. Pancytopenia with cancer history  Code Status:     Code Status Orders  (From admission, onward)        Start     Ordered   11/18/17 2216  Full code  Continuous     11/18/17 2217    Code Status History    Date Active Date Inactive Code Status Order ID Comments User Context   09/22/2017 03:26 09/22/2017 19:40 Full Code [id number]  Lance Coon, MD Inpatient     Disposition Plan: We have been evaluating on a daily basis on when to go home.  Since had a fever of 103 this morning I will hold here in the hospital again.  Time spent: 25 minutes  [place]

## 2017-11-26 NOTE — Progress Notes (Signed)
Physical Therapy Treatment Patient Details Name: Melanie Holloway MRN: [id number] DOB: [birth year] Today's Date: 11/26/2017    History of Present Illness Pt is a 52 y.o. female with a known history of right breast cancer with metastases/probable tumor recurrence, anemia-status post 2 packed red blood cells transfusion within the last week, beta thalassemia, lupus, presenting with 1-2-week history of worsening breath, worse with recumbency, was seen by primary care provider 3 days ago-prescribed doxycycline/prednisone, no improvement, echocardiogram done recently noted for ejection fraction 45-50%, in the emergency room patient tachycardic, tachypneic, hypertensive, chest x-Anecia Holloway noted for cardiomegaly with edema, troponin 0.1, CT chest noted for progression of interstitial disease/airspace disease for which could be related to infection/inflammation/edema/hemorrhage-CT report nonhelpful, patient evaluated in the emergency room, husband at the bedside, patient resting comfortably on BiPAP, noted Rales up to mid back bilaterally with bilateral lower extremity edema, worse with recumbency, positive orthopnea, patient is now been admitted for acute hypoxic respiratory failure and acute newly diagnosed systolic congestive heart failure exacerbation most likely exacerbated by recent blood transfusion.    PT Comments    Pt is making good progress towards goals with pt able to ambulate in hallway safely with RW. Demonstrates reciprocal gait pattern. Pt constantly complaining of being cold, noted pt febrile earlier this date. Pt with BP at 112/72 prior to exertion. Will continue to progress. Encouraged continued mobility efforts.   Follow Up Recommendations  Home health PT     Equipment Recommendations  Rolling walker with 5" wheels    Recommendations for Other Services       Precautions / Restrictions Precautions Precautions: Fall Restrictions Weight Bearing Restrictions: No    Mobility  Bed  Mobility Overal bed mobility: Modified Independent             General bed mobility comments: safe technique performed  Transfers Overall transfer level: Needs assistance Equipment used: Rolling walker (2 wheeled) Transfers: Sit to/from Stand Sit to Stand: Min guard         General transfer comment: Safe technique with cues for upright posture. Slight unsteadiness with initial standing  Ambulation/Gait Ambulation/Gait assistance: Min guard Ambulation Distance (Feet): 120 Feet Assistive device: Rolling walker (2 wheeled) Gait Pattern/deviations: Step-through pattern     General Gait Details: ambulated with RW and reciprocal gait. Pt fatigue with increased distance requesting to return back to room. O2 sats at 97% on RA during ambulation. Slow gait speed.   Stairs            Wheelchair Mobility    Modified Rankin (Stroke Patients Only)       Balance                                            Cognition Arousal/Alertness: Lethargic Behavior During Therapy: WFL for tasks assessed/performed Overall Cognitive Status: Within Functional Limits for tasks assessed                                        Exercises Other Exercises Other Exercises: supine ther-ex performed on B LE including ankle pumps, quad sets, and SLRs. ALl ther-ex performed x 12 reps with supervision and safe technique.    General Comments        Pertinent Vitals/Pain Pain Assessment: No/denies pain    Home Living  Prior Function            PT Goals (current goals can now be found in the care plan section) Acute Rehab PT Goals Patient Stated Goal: to return home with normal energy levels. PT Goal Formulation: With patient Time For Goal Achievement: 12/08/17 Potential to Achieve Goals: Good Progress towards PT goals: Progressing toward goals    Frequency    Min 2X/week      PT Plan Current plan remains  appropriate    Co-evaluation              AM-PAC PT "6 Clicks" Daily Activity  Outcome Measure  Difficulty turning over in bed (including adjusting bedclothes, sheets and blankets)?: A Little Difficulty moving from lying on back to sitting on the side of the bed? : A Little Difficulty sitting down on and standing up from a chair with arms (e.g., wheelchair, bedside commode, etc,.)?: Unable Help needed moving to and from a bed to chair (including a wheelchair)?: A Little Help needed walking in hospital room?: A Little Help needed climbing 3-5 steps with a railing? : A Lot 6 Click Score: 15    End of Session Equipment Utilized During Treatment: Gait belt Activity Tolerance: Patient limited by fatigue Patient left: in bed;with call bell/phone within reach;with bed alarm set Nurse Communication: Mobility status PT Visit Diagnosis: Unsteadiness on feet (R26.81);Muscle weakness (generalized) (M62.81)     Time: 9924-2683 PT Time Calculation (min) (ACUTE ONLY): 27 min  Charges:  $Gait Training: 8-22 mins $Therapeutic Exercise: 8-22 mins                    G Codes:       Melanie Holloway, PT, DPT [phone]    Melanie Holloway 11/26/2017, 4:49 PM

## 2017-11-26 NOTE — Progress Notes (Signed)
Pharmacy Antibiotic Note  Melanie Holloway is a 52 y.o. female admitted on 11/18/2017 with pneumonia.  Pharmacy has been consulted for piperacillin/tazobactam dosing.  This is day #4 of IV antibiotics.  Plan: Continue piperacillin/tazobactam 3.375 g IV q8h EI  Height: 5' (152.4 cm) Weight: 141 lb 4.8 oz (64.1 kg)(Pt not feeling well enough to stand) IBW/kg (Calculated) : 45.5  Temp (24hrs), Avg:100.2 F (37.9 C), Min:99.2 F (37.3 C), Max:103.2 F (39.6 C)  Recent Labs  Lab 11/22/17 0248 11/23/17 0646 11/24/17 0959 11/25/17 0718 11/26/17 0805  WBC  --  2.6*  --  2.2* 2.5*  CREATININE 1.48* 1.50* 1.39* 1.32* 1.55*    Estimated Creatinine Clearance: 35.1 mL/min (A) (by C-G formula based on SCr of 1.55 mg/dL (H)).    No Known Allergies  Antimicrobials this admission: Piperacillin/tazobactam 2/5 >>  vancomycin 2/5 >> 2/7  Dose adjustments this admission:  Microbiology results: Thank you for allowing pharmacy to be a part of this patient's care.  Lenis Noon, PharmD, BCPS Clinical Pharmacist 11/26/2017 1:16 PM

## 2017-11-26 NOTE — Plan of Care (Signed)
Pt is A&Ox4. VSS . RA . NSR on monitor. No complaints thus far. Family at bedside. OOB with standby assist. IV antibiotics continued per order. Flu swab sent this shift per order and is negative. Will continue to monitor and report to oncoming RN .  Progressing Education: Knowledge of General Education information will improve 11/26/2017 1533 - Progressing by Aleen Campi, RN Health Behavior/Discharge Planning: Ability to manage health-related needs will improve 11/26/2017 1533 - Progressing by Aleen Campi, RN Clinical Measurements: Ability to maintain clinical measurements within normal limits will improve 11/26/2017 1533 - Progressing by Aleen Campi, RN Will remain free from infection 11/26/2017 1533 - Progressing by Aleen Campi, RN Diagnostic test results will improve 11/26/2017 1533 - Progressing by Aleen Campi, RN Respiratory complications will improve 11/26/2017 1533 - Progressing by Aleen Campi, RN Cardiovascular complication will be avoided 11/26/2017 1533 - Progressing by Aleen Campi, RN Activity: Risk for activity intolerance will decrease 11/26/2017 1533 - Progressing by Aleen Campi, RN Nutrition: Adequate nutrition will be maintained 11/26/2017 1533 - Progressing by Aleen Campi, RN Coping: Level of anxiety will decrease 11/26/2017 1533 - Progressing by Aleen Campi, RN Elimination: Will not experience complications related to bowel motility 11/26/2017 1533 - Progressing by Aleen Campi, RN Will not experience complications related to urinary retention 11/26/2017 1533 - Progressing by Aleen Campi, RN Pain Managment: General experience of comfort will improve 11/26/2017 1533 - Progressing by Aleen Campi, RN Safety: Ability to remain free from injury will improve 11/26/2017 1533 - Progressing by Aleen Campi, RN Skin Integrity: Risk for impaired skin integrity will decrease 11/26/2017 1533 - Progressing by Aleen Campi, RN

## 2017-11-27 MED ORDER — ALBUTEROL SULFATE (2.5 MG/3ML) 0.083% IN NEBU: 2.5000 mg | INHALATION_SOLUTION | RESPIRATORY_TRACT | Status: DC | PRN

## 2017-11-27 MED ORDER — IPRATROPIUM-ALBUTEROL 0.5-2.5 (3) MG/3ML IN SOLN
3.0000 mL | Freq: Four times a day (QID) | RESPIRATORY_TRACT | Status: DC
  Administered 2017-11-28 (×4): 3 mL via RESPIRATORY_TRACT
  Filled 2017-11-27 (×4): qty 3

## 2017-11-27 NOTE — Progress Notes (Signed)
Sent dr. Darvin Neighbours text page to make aware patients temp is 102.6 no blood cultures seen. Waiting for md to return page. Will continue to monitor

## 2017-11-27 NOTE — Progress Notes (Signed)
Patient ID: Melanie Holloway, female   DOB: 1965-12-06, 52 y.o.   MRN: [id number]  [facility] PROGRESS NOTE  Melanie Holloway BMW:[id number] DOB: 1966/04/20 DOA: 11/18/2017 PCP: Ria Bush, MD  HPI/Subjective: Patient had 103 fever earlier this morning.  Patient still with weakness and some cough.  Had some diarrhea.  Objective: Vitals:   11/27/17 0841 11/27/17 0954  BP: 110/67   Pulse: 85   Resp: 20   Temp: (!) 103.1 F (39.5 C) 99.6 F (37.6 C)  SpO2:      Filed Weights   11/25/17 0236 11/26/17 0510 11/27/17 0502  Weight: 64.7 kg (142 lb 11.2 oz) 64.1 kg (141 lb 4.8 oz) 65.3 kg (144 lb)    ROS: Review of Systems  Constitutional: Positive for malaise/fatigue. Negative for chills and fever.  HENT: Negative for congestion and sore throat.   Eyes: Negative for blurred vision.  Respiratory: Positive for cough, shortness of breath and wheezing.   Cardiovascular: Positive for orthopnea. Negative for chest pain.  Gastrointestinal: Positive for diarrhea. Negative for abdominal pain, constipation, nausea and vomiting.  Genitourinary: Negative for dysuria.  Musculoskeletal: Negative for joint pain.  Neurological: Negative for dizziness and headaches.   Exam: Physical Exam  HENT:  Nose: No mucosal edema.  Mouth/Throat: No oropharyngeal exudate or posterior oropharyngeal edema.  Eyes: Conjunctivae, EOM and lids are normal. Pupils are equal, round, and reactive to light.  Neck: No JVD present. Carotid bruit is not present. No edema present. No thyroid mass and no thyromegaly present.  Cardiovascular: S1 normal and S2 normal. Exam reveals no gallop.  No murmur heard. Pulses:      Dorsalis pedis pulses are 2+ on the right side, and 2+ on the left side.  Respiratory: No respiratory distress. She has decreased breath sounds in the right lower field and the left lower field. She has wheezes in the right lower field and the left lower field. She has no rhonchi. She has no rales.   GI: Soft. Bowel sounds are normal. There is no tenderness.  Musculoskeletal:       Right ankle: She exhibits swelling.       Left ankle: She exhibits swelling.  Lymphadenopathy:    She has no cervical adenopathy.  Neurological: She is alert. No cranial nerve deficit.  Skin: Skin is warm. No rash noted. Nails show no clubbing.  Psychiatric: She has a normal mood and affect.      Data Reviewed: Basic Metabolic Panel: Recent Labs  Lab 11/21/17 0536 11/22/17 0248 11/23/17 0646 11/24/17 0959 11/25/17 0718 11/26/17 0805  NA 133* 132* 133*  --  131* 131*  K 3.5 4.1 4.2  --  4.0 4.0  CL 104 102 105  --  103 103  CO2 18* 19* 21*  --  21* 20*  GLUCOSE 83 83 91  --  102* 91  BUN 54* 58* 59*  --  41* 39*  CREATININE 1.43* 1.48* 1.50* 1.39* 1.32* 1.55*  CALCIUM 8.6* 8.4* 8.5*  --  8.5* 8.3*  MG  --  1.7  --   --   --   --    CBC: Recent Labs  Lab 11/23/17 0646 11/25/17 0718 11/26/17 0805  WBC 2.6* 2.2* 2.5*  NEUTROABS  --   --  1.3*  HGB 8.4* 7.7* 8.2*  HCT 24.5* 23.0* 24.3*  MCV 86.0 86.3 86.6  PLT 56* 62* 67*   Cardiac Enzymes: No results for input(s): CKTOTAL, CKMB, CKMBINDEX, TROPONINI in the last 168 hours. BNP (  last 3 results) Recent Labs    11/18/17 1715 11/20/17 0405  BNP 2,975.0* 852.0*     CBG: No results for input(s): GLUCAP in the last 168 hours.  Recent Results (from the past 240 hour(s))  MRSA PCR Screening     Status: None   Collection Time: 11/18/17  9:50 PM  Result Value Ref Range Status   MRSA by PCR NEGATIVE NEGATIVE Final    Comment:        The GeneXpert MRSA Assay (FDA approved for NASAL specimens only), is one component of a comprehensive MRSA colonization surveillance program. It is not intended to diagnose MRSA infection nor to guide or monitor treatment for MRSA infections. Performed at [facility], [address] [place], [place] [address]   MRSA PCR Screening     Status: None   Collection Time: 11/23/17  5:41 PM   Result Value Ref Range Status   MRSA by PCR NEGATIVE NEGATIVE Final    Comment:        The GeneXpert MRSA Assay (FDA approved for NASAL specimens only), is one component of a comprehensive MRSA colonization surveillance program. It is not intended to diagnose MRSA infection nor to guide or monitor treatment for MRSA infections. Performed at [facility], [address] [place], [place] [address]   Gastrointestinal Panel by PCR , Stool     Status: None   Collection Time: 11/26/17  8:12 AM  Result Value Ref Range Status   Campylobacter species NOT DETECTED NOT DETECTED Final   Plesimonas shigelloides NOT DETECTED NOT DETECTED Final   Salmonella species NOT DETECTED NOT DETECTED Final   Yersinia enterocolitica NOT DETECTED NOT DETECTED Final   Vibrio species NOT DETECTED NOT DETECTED Final   Vibrio cholerae NOT DETECTED NOT DETECTED Final   Enteroaggregative E coli (EAEC) NOT DETECTED NOT DETECTED Final   Enteropathogenic E coli (EPEC) NOT DETECTED NOT DETECTED Final   Enterotoxigenic E coli (ETEC) NOT DETECTED NOT DETECTED Final   Shiga like toxin producing E coli (STEC) NOT DETECTED NOT DETECTED Final   Shigella/Enteroinvasive E coli (EIEC) NOT DETECTED NOT DETECTED Final   Cryptosporidium NOT DETECTED NOT DETECTED Final   Cyclospora cayetanensis NOT DETECTED NOT DETECTED Final   Entamoeba histolytica NOT DETECTED NOT DETECTED Final   Giardia lamblia NOT DETECTED NOT DETECTED Final   Adenovirus F40/41 NOT DETECTED NOT DETECTED Final   Astrovirus NOT DETECTED NOT DETECTED Final   Norovirus GI/GII NOT DETECTED NOT DETECTED Final   Rotavirus A NOT DETECTED NOT DETECTED Final   Sapovirus (I, II, IV, and V) NOT DETECTED NOT DETECTED Final    Comment: Performed at [facility], [address] [place], [place] [address]  C difficile quick scan w PCR reflex     Status: None   Collection Time: 11/26/17  8:12 AM  Result Value Ref Range Status   C Diff antigen  NEGATIVE NEGATIVE Final   C Diff toxin NEGATIVE NEGATIVE Final   C Diff interpretation No C. difficile detected.  Final    Comment: Performed at [facility], [address] [place], [place] [address]      Scheduled Meds: . albuterol  2.5 mg Inhalation Q6H  . budesonide (PULMICORT) nebulizer solution  0.5 mg Nebulization BID  . carvedilol  6.25 mg Oral BID WC  . cholecalciferol  2,000 Units Oral Daily  . ferrous sulfate  325 mg Oral BID WC  . letrozole  2.5 mg Oral Daily  . multivitamin with  minerals  1 tablet Oral Daily  . protein supplement shake  11 oz Oral BID BM  . sodium chloride flush  3 mL Intravenous Q12H   Continuous Infusions: . sodium chloride    . sodium chloride Stopped (11/22/17 1900)  . piperacillin-tazobactam (ZOSYN)  IV 3.375 g (11/27/17 1449)    Assessment/Plan:  1. HCAP with on going fever.  Acute hypoxic respiratory failure has resolved.       On broad-spectrum IV antibiotics.  Ordered blood cultures today.  Will consult pulmonary for further input is not showing much improvement 2. Acute mid range congestive heart failure with EF 45-50%.  Moderate to large pericardial effusion.  Improved.  Hold Lasix 3. Acute kidney injury on chronic kidney disease stage III.  Hold Lasix with creatinine up a little bit today. 4. Metastatic breast cancer proven on biopsy of the lymph nodes.  Advised that she needs to get back to her oncologist.  Patient also has a pericardial effusion and lymphadenopathy seen on the chest. 5. Essential hypertension.  Blood pressure on the lower side.  Holding all medications except for lower dose Coreg at this point 6. Weakness.  Physical therapy recommended home health. 7. Pancytopenia with cancer history- worsening. Consult oncology  Code Status:     Code Status Orders  (From admission, onward)        Start     Ordered   11/18/17 2216  Full code  Continuous     11/18/17 2217    Code Status History    Date Active Date  Inactive Code Status Order ID Comments User Context   09/22/2017 03:26 09/22/2017 19:40 Full Code [id number]  Lance Coon, MD Inpatient       Time spent: 25 minutes  [facility]

## 2017-11-27 NOTE — Progress Notes (Signed)
Spoke with dr. Darvin Neighbours regarding bp 97/62 and hr 81. Per md discontinue scheduled coreg. Will continue to monitor

## 2017-11-28 DIAGNOSIS — C50919 Malignant neoplasm of unspecified site of unspecified female breast: Secondary | ICD-10-CM

## 2017-11-28 DIAGNOSIS — J9690 Respiratory failure, unspecified, unspecified whether with hypoxia or hypercapnia: Secondary | ICD-10-CM

## 2017-11-28 DIAGNOSIS — D61818 Other pancytopenia: Secondary | ICD-10-CM

## 2017-11-28 DIAGNOSIS — C7951 Secondary malignant neoplasm of bone: Secondary | ICD-10-CM

## 2017-11-28 DIAGNOSIS — R0602 Shortness of breath: Secondary | ICD-10-CM

## 2017-11-28 LAB — CBC WITH DIFFERENTIAL/PLATELET
Basophils Absolute: 0 10*3/uL (ref 0–0.1)
Basophils Relative: 1 %
EOS PCT: 0 %
Eosinophils Absolute: 0 10*3/uL (ref 0–0.7)
HEMATOCRIT: 21.4 % — AB (ref 35.0–47.0)
Hemoglobin: 7.2 g/dL — ABNORMAL LOW (ref 12.0–16.0)
LYMPHS ABS: 0.8 10*3/uL — AB (ref 1.0–3.6)
LYMPHS PCT: 28 %
MCH: 29 pg (ref 26.0–34.0)
MCHC: 33.5 g/dL (ref 32.0–36.0)
MCV: 86.5 fL (ref 80.0–100.0)
MONO ABS: 0.1 10*3/uL — AB (ref 0.2–0.9)
MONOS PCT: 5 %
NEUTROS ABS: 2 10*3/uL (ref 1.4–6.5)
Neutrophils Relative %: 66 %
PLATELETS: 68 10*3/uL — AB (ref 150–440)
RBC: 2.48 MIL/uL — ABNORMAL LOW (ref 3.80–5.20)
RDW: 15.7 % — AB (ref 11.5–14.5)
WBC: 3 10*3/uL — ABNORMAL LOW (ref 3.6–11.0)

## 2017-11-28 LAB — BASIC METABOLIC PANEL
ANION GAP: 8 (ref 5–15)
BUN: 46 mg/dL — AB (ref 6–20)
CALCIUM: 8 mg/dL — AB (ref 8.9–10.3)
CO2: 19 mmol/L — AB (ref 22–32)
Chloride: 101 mmol/L (ref 101–111)
Creatinine, Ser: 1.79 mg/dL — ABNORMAL HIGH (ref 0.44–1.00)
GFR calc Af Amer: 36 mL/min — ABNORMAL LOW (ref >60)
GFR calc non Af Amer: 31 mL/min — ABNORMAL LOW (ref >60)
GLUCOSE: 93 mg/dL (ref 65–99)
Potassium: 3.9 mmol/L (ref 3.5–5.1)
Sodium: 128 mmol/L — ABNORMAL LOW (ref 135–145)

## 2017-11-28 LAB — LACTIC ACID, PLASMA: Lactic Acid, Venous: 0.8 mmol/L (ref 0.5–1.9)

## 2017-11-28 MED ORDER — AZITHROMYCIN 500 MG PO TABS
500.0000 mg | ORAL_TABLET | Freq: Every day | ORAL | Status: AC
  Administered 2017-11-28 – 2017-12-02 (×5): 500 mg via ORAL
  Filled 2017-11-28 (×5): qty 1

## 2017-11-28 MED ORDER — METOPROLOL TARTRATE 25 MG PO TABS
12.5000 mg | ORAL_TABLET | Freq: Two times a day (BID) | ORAL | Status: DC
  Administered 2017-11-29 – 2017-12-08 (×18): 12.5 mg via ORAL
  Filled 2017-11-28 (×19): qty 1

## 2017-11-28 NOTE — Progress Notes (Addendum)
Patient ID: Melanie Holloway, female   DOB: 06/02/1966, 52 y.o.   MRN: [id number]  [facility] PROGRESS NOTE  Melanie Holloway VCB:[id number] DOB: Dec 04, 1965 DOA: 11/18/2017 PCP: Ria Bush, MD  HPI/Subjective:  Patient had 103 fever earlier this morning again with tachycardia On 2 L O2  Objective: Vitals:   11/28/17 0956 11/28/17 1303  BP: (!) 91/54 107/71  Pulse: 89 93  Resp: 20 20  Temp: 99.9 F (37.7 C) (!) 101.5 F (38.6 C)  SpO2: 99% 100%    Filed Weights   11/27/17 0502 11/27/17 2005 11/28/17 0625  Weight: 65.3 kg (144 lb) 67.8 kg (149 lb 7.6 oz) 67.1 kg (147 lb 14.9 oz)    ROS: Review of Systems  Constitutional: Positive for malaise/fatigue. Negative for chills and fever.  HENT: Negative for congestion and sore throat.   Eyes: Negative for blurred vision.  Respiratory: Positive for cough, shortness of breath and wheezing.   Cardiovascular: Positive for orthopnea. Negative for chest pain.  Gastrointestinal: Positive for diarrhea. Negative for abdominal pain, constipation, nausea and vomiting.  Genitourinary: Negative for dysuria.  Musculoskeletal: Negative for joint pain.  Neurological: Negative for dizziness and headaches.   Exam: Physical Exam  HENT:  Nose: No mucosal edema.  Mouth/Throat: No oropharyngeal exudate or posterior oropharyngeal edema.  Eyes: Conjunctivae, EOM and lids are normal. Pupils are equal, round, and reactive to light.  Neck: No JVD present. Carotid bruit is not present. No edema present. No thyroid mass and no thyromegaly present.  Cardiovascular: S1 normal and S2 normal. Exam reveals no gallop.  No murmur heard. Pulses:      Dorsalis pedis pulses are 2+ on the right side, and 2+ on the left side.  Respiratory: No respiratory distress. She has decreased breath sounds in the right lower field and the left lower field. She has wheezes in the right lower field and the left lower field. She has no rhonchi. She has no rales.  GI: Soft.  Bowel sounds are normal. There is no tenderness.  Musculoskeletal:       Right ankle: She exhibits swelling.       Left ankle: She exhibits swelling.  Lymphadenopathy:    She has no cervical adenopathy.  Neurological: She is alert. No cranial nerve deficit.  Skin: Skin is warm. No rash noted. Nails show no clubbing.  Psychiatric: She has a normal mood and affect.   Data Reviewed: Basic Metabolic Panel: Recent Labs  Lab 11/22/17 0248 11/23/17 3846 11/24/17 0959 11/25/17 0718 11/26/17 0805 11/28/17 0819  NA 132* 133*  --  131* 131* 128*  K 4.1 4.2  --  4.0 4.0 3.9  CL 102 105  --  103 103 101  CO2 19* 21*  --  21* 20* 19*  GLUCOSE 83 91  --  102* 91 93  BUN 58* 59*  --  41* 39* 46*  CREATININE 1.48* 1.50* 1.39* 1.32* 1.55* 1.79*  CALCIUM 8.4* 8.5*  --  8.5* 8.3* 8.0*  MG 1.7  --   --   --   --   --    CBC: Recent Labs  Lab 11/23/17 0646 11/25/17 0718 11/26/17 0805 11/28/17 0819  WBC 2.6* 2.2* 2.5* 3.0*  NEUTROABS  --   --  1.3* 2.0  HGB 8.4* 7.7* 8.2* 7.2*  HCT 24.5* 23.0* 24.3* 21.4*  MCV 86.0 86.3 86.6 86.5  PLT 56* 62* 67* 68*   Cardiac Enzymes: No results for input(s): CKTOTAL, CKMB, CKMBINDEX, TROPONINI in the last 168  hours. BNP (last 3 results) Recent Labs    11/18/17 1715 11/20/17 0405  BNP 2,975.0* 852.0*     CBG: No results for input(s): GLUCAP in the last 168 hours.  Recent Results (from the past 240 hour(s))  MRSA PCR Screening     Status: None   Collection Time: 11/18/17  9:50 PM  Result Value Ref Range Status   MRSA by PCR NEGATIVE NEGATIVE Final    Comment:        The GeneXpert MRSA Assay (FDA approved for NASAL specimens only), is one component of a comprehensive MRSA colonization surveillance program. It is not intended to diagnose MRSA infection nor to guide or monitor treatment for MRSA infections. Performed at [facility], [address] [place], [place] [address]   MRSA PCR Screening     Status: None    Collection Time: 11/23/17  5:41 PM  Result Value Ref Range Status   MRSA by PCR NEGATIVE NEGATIVE Final    Comment:        The GeneXpert MRSA Assay (FDA approved for NASAL specimens only), is one component of a comprehensive MRSA colonization surveillance program. It is not intended to diagnose MRSA infection nor to guide or monitor treatment for MRSA infections. Performed at [facility], [address] [place], [place] [address]   Gastrointestinal Panel by PCR , Stool     Status: None   Collection Time: 11/26/17  8:12 AM  Result Value Ref Range Status   Campylobacter species NOT DETECTED NOT DETECTED Final   Plesimonas shigelloides NOT DETECTED NOT DETECTED Final   Salmonella species NOT DETECTED NOT DETECTED Final   Yersinia enterocolitica NOT DETECTED NOT DETECTED Final   Vibrio species NOT DETECTED NOT DETECTED Final   Vibrio cholerae NOT DETECTED NOT DETECTED Final   Enteroaggregative E coli (EAEC) NOT DETECTED NOT DETECTED Final   Enteropathogenic E coli (EPEC) NOT DETECTED NOT DETECTED Final   Enterotoxigenic E coli (ETEC) NOT DETECTED NOT DETECTED Final   Shiga like toxin producing E coli (STEC) NOT DETECTED NOT DETECTED Final   Shigella/Enteroinvasive E coli (EIEC) NOT DETECTED NOT DETECTED Final   Cryptosporidium NOT DETECTED NOT DETECTED Final   Cyclospora cayetanensis NOT DETECTED NOT DETECTED Final   Entamoeba histolytica NOT DETECTED NOT DETECTED Final   Giardia lamblia NOT DETECTED NOT DETECTED Final   Adenovirus F40/41 NOT DETECTED NOT DETECTED Final   Astrovirus NOT DETECTED NOT DETECTED Final   Norovirus GI/GII NOT DETECTED NOT DETECTED Final   Rotavirus A NOT DETECTED NOT DETECTED Final   Sapovirus (I, II, IV, and V) NOT DETECTED NOT DETECTED Final    Comment: Performed at [facility], [address] [place], [place] [address]  C difficile quick scan w PCR reflex     Status: None   Collection Time: 11/26/17  8:12 AM  Result Value Ref  Range Status   C Diff antigen NEGATIVE NEGATIVE Final   C Diff toxin NEGATIVE NEGATIVE Final   C Diff interpretation No C. difficile detected.  Final    Comment: Performed at [facility] [address] [place], [place] [address]  CULTURE, BLOOD (ROUTINE X 2) w Reflex to ID Panel     Status: None (Preliminary result)   Collection Time: 11/27/17  8:58 AM  Result Value Ref Range Status   Specimen Description BLOOD RIGHT ANTECUBITAL  Final   Special Requests   Final    BOTTLES DRAWN AEROBIC AND ANAEROBIC Blood Culture adequate volume  Culture   Final    NO GROWTH < 24 HOURS Performed at [facility], [address] [place], [place] [address]    Report Status PENDING  Incomplete  CULTURE, BLOOD (ROUTINE X 2) w Reflex to ID Panel     Status: None (Preliminary result)   Collection Time: 11/27/17 11:03 AM  Result Value Ref Range Status   Specimen Description BLOOD RIGHT ANTECUBITAL  Final   Special Requests   Final    BOTTLES DRAWN AEROBIC AND ANAEROBIC Blood Culture adequate volume   Culture   Final    NO GROWTH < 24 HOURS Performed at [facility], [address] [place], [place] [address]    Report Status PENDING  Incomplete      Scheduled Meds: . azithromycin  500 mg Oral Daily  . budesonide (PULMICORT) nebulizer solution  0.5 mg Nebulization BID  . cholecalciferol  2,000 Units Oral Daily  . ferrous sulfate  325 mg Oral BID WC  . ipratropium-albuterol  3 mL Nebulization Q6H  . letrozole  2.5 mg Oral Daily  . metoprolol tartrate  12.5 mg Oral BID  . multivitamin with minerals  1 tablet Oral Daily  . protein supplement shake  11 oz Oral BID BM  . sodium chloride flush  3 mL Intravenous Q12H   Continuous Infusions: . sodium chloride    . piperacillin-tazobactam (ZOSYN)  IV Stopped (11/28/17 1034)    Assessment/Plan:  1. HCAP with on going fever.  Acute hypoxic respiratory failure has resolved.       On broad-spectrum IV antibiotics.   Blood cx pending.  Discussed with Dr. Raul Del.  Sepsis. Check STAT lactic acid IVF Bolus 2. Acute mid range congestive heart failure with EF 45-50%.  Moderate to large pericardial effusion.  Improved.  3. Acute kidney injury on chronic kidney disease stage III.  4. Metastatic breast cancer ? LN biopsy negative for breast cancer.  .  Patient also has a pericardial effusion and lymphadenopathy seen on the chest. Malignant? 5. Essential hypertension.  Blood pressure on the lower side.  Holding all medications except for lower dose Coreg at this point 6. Weakness.  Physical therapy recommended home health. 7. Pancytopenia with cancer history- Appreciate oncology input  Consult ID as she continue to spike fevers  Code Status:     Code Status Orders  (From admission, onward)        Start     Ordered   11/18/17 2216  Full code  Continuous     11/18/17 2217    Code Status History    Date Active Date Inactive Code Status Order ID Comments User Context   09/22/2017 03:26 09/22/2017 19:40 Full Code [id number]  Lance Coon, MD Inpatient       Critical care Time spent: 35 minutes  [place] Keary Hanak  [employer]
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
## 2017-11-29 ENCOUNTER — Inpatient Hospital Stay: Payer: 59

## 2017-11-29 ENCOUNTER — Telehealth: Payer: Self-pay | Admitting: Family

## 2017-11-29 ENCOUNTER — Ambulatory Visit: Payer: 59 | Admitting: Family

## 2017-11-29 DIAGNOSIS — D696 Thrombocytopenia, unspecified: Secondary | ICD-10-CM

## 2017-11-29 DIAGNOSIS — R509 Fever, unspecified: Secondary | ICD-10-CM

## 2017-11-29 DIAGNOSIS — R042 Hemoptysis: Secondary | ICD-10-CM

## 2017-11-29 LAB — BASIC METABOLIC PANEL
Anion gap: 10 (ref 5–15)
BUN: 45 mg/dL — AB (ref 6–20)
CO2: 19 mmol/L — ABNORMAL LOW (ref 22–32)
CREATININE: 1.7 mg/dL — AB (ref 0.44–1.00)
Calcium: 8.3 mg/dL — ABNORMAL LOW (ref 8.9–10.3)
Chloride: 100 mmol/L — ABNORMAL LOW (ref 101–111)
GFR, EST AFRICAN AMERICAN: 39 mL/min — AB (ref >60)
GFR, EST NON AFRICAN AMERICAN: 33 mL/min — AB (ref >60)
Glucose, Bld: 107 mg/dL — ABNORMAL HIGH (ref 65–99)
POTASSIUM: 3.9 mmol/L (ref 3.5–5.1)
SODIUM: 129 mmol/L — AB (ref 135–145)

## 2017-11-29 LAB — DAT, POLYSPECIFIC AHG (ARMC ONLY)
DAT, IgG: POSITIVE
DAT, complement: NEGATIVE
Polyspecific AHG test: POSITIVE

## 2017-11-29 LAB — LACTATE DEHYDROGENASE: LDH: 320 U/L — ABNORMAL HIGH (ref 98–192)

## 2017-11-29 LAB — CBC WITH DIFFERENTIAL/PLATELET
Basophils Absolute: 0 10*3/uL (ref 0–0.1)
Basophils Relative: 1 %
Eosinophils Absolute: 0 10*3/uL (ref 0–0.7)
Eosinophils Relative: 0 %
HCT: 23 % — ABNORMAL LOW (ref 35.0–47.0)
Hemoglobin: 7.7 g/dL — ABNORMAL LOW (ref 12.0–16.0)
Lymphocytes Relative: 28 %
Lymphs Abs: 1 10*3/uL (ref 1.0–3.6)
MCH: 29.6 pg (ref 26.0–34.0)
MCHC: 33.7 g/dL (ref 32.0–36.0)
MCV: 87.6 fL (ref 80.0–100.0)
Monocytes Absolute: 0.1 10*3/uL — ABNORMAL LOW (ref 0.2–0.9)
Monocytes Relative: 3 %
Neutro Abs: 2.6 10*3/uL (ref 1.4–6.5)
Neutrophils Relative %: 68 %
Platelets: 76 10*3/uL — ABNORMAL LOW (ref 150–440)
RBC: 2.62 MIL/uL — ABNORMAL LOW (ref 3.80–5.20)
RDW: 15.7 % — ABNORMAL HIGH (ref 11.5–14.5)
WBC: 3.8 10*3/uL (ref 3.6–11.0)

## 2017-11-29 LAB — URIC ACID: Uric Acid, Serum: 5.8 mg/dL (ref 2.3–6.6)

## 2017-11-29 MED ORDER — IPRATROPIUM-ALBUTEROL 0.5-2.5 (3) MG/3ML IN SOLN
3.0000 mL | Freq: Four times a day (QID) | RESPIRATORY_TRACT | Status: DC
  Administered 2017-11-29 – 2017-12-04 (×15): 3 mL via RESPIRATORY_TRACT
  Filled 2017-11-29 (×18): qty 3

## 2017-11-29 MED ORDER — IPRATROPIUM-ALBUTEROL 0.5-2.5 (3) MG/3ML IN SOLN
3.0000 mL | Freq: Three times a day (TID) | RESPIRATORY_TRACT | Status: DC
  Administered 2017-11-29 (×2): 3 mL via RESPIRATORY_TRACT
  Filled 2017-11-29 (×2): qty 3

## 2017-11-29 MED ORDER — SODIUM CHLORIDE 0.9 % IV SOLN
Freq: Once | INTRAVENOUS | Status: AC
  Administered 2017-11-30: 18:00:00 via INTRAVENOUS

## 2017-11-29 MED ORDER — METHYLPREDNISOLONE SODIUM SUCC 125 MG IJ SOLR
60.0000 mg | INTRAMUSCULAR | Status: DC
  Administered 2017-11-29 – 2017-12-07 (×9): 60 mg via INTRAVENOUS
  Filled 2017-11-29 (×9): qty 2

## 2017-11-29 NOTE — Progress Notes (Signed)
Nutrition Follow-up  DOCUMENTATION CODES:   Not applicable  INTERVENTION:  Continue Premier Protein po BID, each supplement provides 160 kcal and 30 grams of protein.  Continue MVI daily.  Provide Magic cup TID with meals, each supplement provides 290 kcal and 9 grams of protein.  NUTRITION DIAGNOSIS:   Inadequate oral intake related to nausea, vomiting, poor appetite as evidenced by per patient/family report.  Ongoing.  GOAL:   Patient will meet greater than or equal to 90% of their needs  Progressing.  MONITOR:   PO intake, I & O's, Labs, Supplement acceptance, Weight trends  REASON FOR ASSESSMENT:   Malnutrition Screening Tool, Consult Diet education  ASSESSMENT:   [name] is a 52 y.o. female  with lupus, beta thalassemia,  hypertension, anemia, vitamin D deficiency, breast cancer status post left mastectomy, chemotherapy and radiation, possible tumor recurrence, who presents with acute hypoxic respiratory failure, newly diagnosed systolic congestive heart failure exacerbation.   -Right supraclavicular biopsy from 11/22/2017 revealed no evidence of metastatic carcinoma. -Patient continues to be febrile and have wheezing so has been unable to discharge.  Met with patient and her husband at bedside. Patient reports she is feeling very weak. Her appetite has decreased. She is now finishing 50% of less of her meals. Meal documentation not recorded regularly so unable to calculate exact intake over the past 24 hours. Patient reports she likes the Premier Protein better than Ensure.  Medications reviewed and include: azithromycin, vitamin D 2000 units daily, ferrous sulfate 325 mg BID, Solu-Medrol 60 mg daily IV, MVI daily, Zosyn.  Labs reviewed: Sodium 129, Chloride 100, CO2 19, BUN 45, Creatinine 1.7.  Diet Order:  Diet 2 gram sodium Room service appropriate? Yes; Fluid consistency: Thin  EDUCATION NEEDS:   Not appropriate for education at this time  Skin:   Skin Assessment: Skin Integrity Issues: Skin Integrity Issues:: Incisions Incisions: closed incision right chest  Last BM:  11/29/2017 - medium type 6  Height:   Ht Readings from Last 1 Encounters:  11/27/17 _0  (1.499 m)    Weight:   Wt Readings from Last 1 Encounters:  11/29/17 147 lb (66.7 kg)    Ideal Body Weight:  44.7 kg  BMI:  Body mass index is 29.69 kg/m.  Estimated Nutritional Needs:   Kcal:  1500-1800kcal/day   Protein:  83-95g/day   Fluid:  per MD  Willey Blade, MS, RD, LDN Office: [phone] Pager: [phone] After Hours/Weekend Pager: [phone]

## 2017-11-29 NOTE — Plan of Care (Signed)
Patient pulse ox is at 99% with 1liter, will try to wean to room air. Patient is running a low grade temperature. Will continue to monitor and assess.

## 2017-11-29 NOTE — Telephone Encounter (Signed)
Appointment has been rescheduled to 12/09/17

## 2017-11-29 NOTE — Progress Notes (Signed)
Patient ID: Melanie Holloway, female   DOB: 08/08/1966, 52 y.o.   MRN: [id number]  [facility] PROGRESS NOTE  Melanie Holloway HAL:[id number] DOB: [birth year] DOA: 11/18/2017 PCP: Ria Bush, MD  HPI/Subjective:  Continues to spike fevers.  On oxygen 2 L.  Some wheezing.  Objective: Vitals:   11/29/17 1359 11/29/17 1457  BP:  133/79  Pulse:  (!) 110  Resp:  20  Temp:  (!) 100.9 F (38.3 C)  SpO2: 93% 95%    Filed Weights   11/27/17 2005 11/28/17 0625 11/29/17 0947  Weight: 67.8 kg (149 lb 7.6 oz) 67.1 kg (147 lb 14.9 oz) 66.7 kg (147 lb)    ROS: Review of Systems  Constitutional: Positive for malaise/fatigue. Negative for chills and fever.  HENT: Negative for congestion and sore throat.   Eyes: Negative for blurred vision.  Respiratory: Positive for cough, shortness of breath and wheezing.   Cardiovascular: Positive for orthopnea. Negative for chest pain.  Gastrointestinal: Positive for diarrhea. Negative for abdominal pain, constipation, nausea and vomiting.  Genitourinary: Negative for dysuria.  Musculoskeletal: Negative for joint pain.  Neurological: Negative for dizziness and headaches.   Exam: Physical Exam  HENT:  Nose: No mucosal edema.  Mouth/Throat: No oropharyngeal exudate or posterior oropharyngeal edema.  Eyes: Conjunctivae, EOM and lids are normal. Pupils are equal, round, and reactive to light.  Neck: No JVD present. Carotid bruit is not present. No edema present. No thyroid mass and no thyromegaly present.  Cardiovascular: S1 normal and S2 normal. Exam reveals no gallop.  No murmur heard. Pulses:      Dorsalis pedis pulses are 2+ on the right side, and 2+ on the left side.  Respiratory: No respiratory distress. She has decreased breath sounds in the right lower field and the left lower field. She has wheezes in the right lower field and the left lower field. She has no rhonchi. She has no rales.  GI: Soft. Bowel sounds are normal. There is no  tenderness.  Musculoskeletal:       Right ankle: She exhibits swelling.       Left ankle: She exhibits swelling.  Lymphadenopathy:    She has no cervical adenopathy.  Neurological: She is alert. No cranial nerve deficit.  Skin: Skin is warm. No rash noted. Nails show no clubbing.  Psychiatric: She has a normal mood and affect.   Data Reviewed: Basic Metabolic Panel: Recent Labs  Lab 11/23/17 0646 11/24/17 0959 11/25/17 0718 11/26/17 0805 11/28/17 0819 11/29/17 0306  NA 133*  --  131* 131* 128* 129*  K 4.2  --  4.0 4.0 3.9 3.9  CL 105  --  103 103 101 100*  CO2 21*  --  21* 20* 19* 19*  GLUCOSE 91  --  102* 91 93 107*  BUN 59*  --  41* 39* 46* 45*  CREATININE 1.50* 1.39* 1.32* 1.55* 1.79* 1.70*  CALCIUM 8.5*  --  8.5* 8.3* 8.0* 8.3*   CBC: Recent Labs  Lab 11/23/17 0646 11/25/17 0718 11/26/17 0805 11/28/17 0819 11/29/17 0306  WBC 2.6* 2.2* 2.5* 3.0* 3.8  NEUTROABS  --   --  1.3* 2.0 2.6  HGB 8.4* 7.7* 8.2* 7.2* 7.7*  HCT 24.5* 23.0* 24.3* 21.4* 23.0*  MCV 86.0 86.3 86.6 86.5 87.6  PLT 56* 62* 67* 68* 76*   Cardiac Enzymes: No results for input(s): CKTOTAL, CKMB, CKMBINDEX, TROPONINI in the last 168 hours. BNP (last 3 results) Recent Labs    11/18/17 1715 11/20/17 0405  BNP 2,975.0* 852.0*     CBG: No results for input(s): GLUCAP in the last 168 hours.  Recent Results (from the past 240 hour(s))  MRSA PCR Screening     Status: None   Collection Time: 11/23/17  5:41 PM  Result Value Ref Range Status   MRSA by PCR NEGATIVE NEGATIVE Final    Comment:        The GeneXpert MRSA Assay (FDA approved for NASAL specimens only), is one component of a comprehensive MRSA colonization surveillance program. It is not intended to diagnose MRSA infection nor to guide or monitor treatment for MRSA infections. Performed at [facility], [address] [place], [place] [address]   Gastrointestinal Panel by PCR , Stool     Status: None   Collection  Time: 11/26/17  8:12 AM  Result Value Ref Range Status   Campylobacter species NOT DETECTED NOT DETECTED Final   Plesimonas shigelloides NOT DETECTED NOT DETECTED Final   Salmonella species NOT DETECTED NOT DETECTED Final   Yersinia enterocolitica NOT DETECTED NOT DETECTED Final   Vibrio species NOT DETECTED NOT DETECTED Final   Vibrio cholerae NOT DETECTED NOT DETECTED Final   Enteroaggregative E coli (EAEC) NOT DETECTED NOT DETECTED Final   Enteropathogenic E coli (EPEC) NOT DETECTED NOT DETECTED Final   Enterotoxigenic E coli (ETEC) NOT DETECTED NOT DETECTED Final   Shiga like toxin producing E coli (STEC) NOT DETECTED NOT DETECTED Final   Shigella/Enteroinvasive E coli (EIEC) NOT DETECTED NOT DETECTED Final   Cryptosporidium NOT DETECTED NOT DETECTED Final   Cyclospora cayetanensis NOT DETECTED NOT DETECTED Final   Entamoeba histolytica NOT DETECTED NOT DETECTED Final   Giardia lamblia NOT DETECTED NOT DETECTED Final   Adenovirus F40/41 NOT DETECTED NOT DETECTED Final   Astrovirus NOT DETECTED NOT DETECTED Final   Norovirus GI/GII NOT DETECTED NOT DETECTED Final   Rotavirus A NOT DETECTED NOT DETECTED Final   Sapovirus (I, II, IV, and V) NOT DETECTED NOT DETECTED Final    Comment: Performed at [facility], [address] [place], [place] [address]  C difficile quick scan w PCR reflex     Status: None   Collection Time: 11/26/17  8:12 AM  Result Value Ref Range Status   C Diff antigen NEGATIVE NEGATIVE Final   C Diff toxin NEGATIVE NEGATIVE Final   C Diff interpretation No C. difficile detected.  Final    Comment: Performed at [facility], [address] [place], [place] [address]  CULTURE, BLOOD (ROUTINE X 2) w Reflex to ID Panel     Status: None (Preliminary result)   Collection Time: 11/27/17  8:58 AM  Result Value Ref Range Status   Specimen Description BLOOD RIGHT ANTECUBITAL  Final   Special Requests   Final    BOTTLES DRAWN AEROBIC AND ANAEROBIC  Blood Culture adequate volume   Culture   Final    NO GROWTH 2 DAYS Performed at [facility], [address] [place], [place] [address]    Report Status PENDING  Incomplete  CULTURE, BLOOD (ROUTINE X 2) w Reflex to ID Panel     Status: None (Preliminary result)   Collection Time: 11/27/17 11:03 AM  Result Value Ref Range Status   Specimen Description BLOOD RIGHT ANTECUBITAL  Final   Special Requests   Final    BOTTLES DRAWN AEROBIC AND ANAEROBIC Blood Culture adequate volume   Culture   Final    NO GROWTH 2 DAYS Performed at [facility], [address] [place], [place] [address]    Report Status PENDING  Incomplete      Scheduled Meds: . azithromycin  500 mg Oral Daily  . budesonide (PULMICORT) nebulizer solution  0.5 mg Nebulization BID  . cholecalciferol  2,000 Units Oral Daily  . ferrous sulfate  325 mg Oral BID WC  . ipratropium-albuterol  3 mL Nebulization Q6H  . letrozole  2.5 mg Oral Daily  . methylPREDNISolone (SOLU-MEDROL) injection  60 mg Intravenous Q24H  . metoprolol tartrate  12.5 mg Oral BID  . multivitamin with minerals  1 tablet Oral Daily  . protein supplement shake  11 oz Oral BID BM  . sodium chloride flush  3 mL Intravenous Q12H   Continuous Infusions: . sodium chloride    . piperacillin-tazobactam (ZOSYN)  IV Stopped (11/29/17 0841)    Assessment/Plan:  * HCAP with on going fever-worsening Continues to have fevers.  Hemoptysis.  Will repeat CT scan of the chest.  Has acute hypoxic respiratory failure.  Discussed with Dr. Ola Spurr and Dr. Raul Del.  Will need bronchoscopy.  On IV Zosyn and azithromycin.  Will need to transfer to stepdown if any worsening.  Presently on 2 L oxygen. Will add scheduled nebulizers and IV steroids.  First dose stat  1. Acute mid range congestive heart failure with EF 45-50%.  Moderate to large pericardial effusion.  Improved.  2. Acute kidney injury on chronic kidney disease stage III.   3. Metastatic breast cancer ? LN biopsy negative for breast cancer.  .  Patient also has a pericardial effusion and lymphadenopathy seen on the chest. Malignant? 4. Essential hypertension.  Blood pressure on the lower side.  Holding all medications except for lower dose Coreg at this point 5. Weakness.  Physical therapy recommended home health. 6. Pancytopenia with cancer history- Appreciate oncology input    Code Status:     Code Status Orders  (From admission, onward)        Start     Ordered   11/18/17 2216  Full code  Continuous     11/18/17 2217    Code Status History    Date Active Date Inactive Code Status Order ID Comments User Context   09/22/2017 03:26 09/22/2017 19:40 Full Code [id number]  Lance Coon, MD Inpatient     Critical care Time spent: 35 minutes  [place] Dayra Rapley  [employer]

## 2017-11-29 NOTE — Consult Note (Signed)
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 could be due to bronchitis or interstitial pneumonitis. Interstitial spread of tumor is also a possibility. Findings progressive since the prior abdominal CT scan. There are few small scattered nodules which are more likely inflammatory. No pleural effusion. Musculoskeletal: Surgical changes from remote left-sided mastectomy. There are numerous enlarged intramammary lymph nodes on the right side without a discrete breast mass. Recommend correlation with mammography and ultrasound. Index lymph node on image number 22 measures 11 mm. There is also extensive right axillary adenopathy with a 24 x 12 mm node on image number 19 and 822 x 10 mm node on image number 12. No left-sided axillary adenopathy  seen.  Surgical changes are noted. Right-sided supraclavicular adenopathy. Cluster of lymph nodes on image number 4 measures 4.0 x 1.7 cm. Sclerotic bone metastasis are noted in the manubrium of the sternum and also in the T8 vertebral body. CT ABDOMEN PELVIS FINDINGS Hepatobiliary: No findings to suggest hepatic metastatic disease. The gallbladder is filled with stones and there is wall thickening and pericholecystic fluid possibly chronic cholecystitis. Moderate common bile duct dilatation increased since prior study. It measures 10.5 mm in the porta hepatis and 8 mm in the head of the pancreas but tapers normally to the ampulla. Pancreas: No mass, inflammation or ductal dilatation. Spleen: Small splenic lesions suspicious for metastatic disease. 10 mm lesion in the upper and medial aspect of the spleen has enlarged since the prior study. Adrenals/Urinary Tract: The adrenal glands and kidneys are unremarkable. The bladder appears normal. Stomach/Bowel: The stomach, duodenum, small bowel and colon are unremarkable. No persistent inflammatory process. The terminal ileum and appendix are normal. Vascular/Lymphatic: The aorta and branch vessels are patent. Moderate tortuosity and scattered atherosclerotic calcifications. The major venous structures are patent. Stable enlarged periportal, retroperitoneal and bilateral iliac adenopathy. Stable enlarged bilateral operator and external iliac lymph nodes. Left external iliac node on image number 88 measures 14.5 mm and on the right measures 12 mm. Right operator lymph node on image number 93 measures 13.5 mm and on the left measures 14 mm. Reproductive: The uterus and ovaries are normal. Other: Small amount of free pelvic fluid. No inguinal mass. Small scattered inguinal lymph nodes. Musculoskeletal: Vague area of decreased attenuation in the right aspect of L2 could be a metastatic focus. There is a lytic lesion in the left L5 pedicle consistent with metastasis. No  definite pelvic or hip lesions. IMPRESSION: 1. Enlarged right-sided intramammary lymph nodes and extensive right axillary and supraclavicular adenopathy highly suspicious for breast cancer. A discrete breast mass is not identified but recommend correlation with mammography and ultrasound. 2. Mediastinal and hilar lymphadenopathy. 3. Nodular interstitial process in the lungs could be  interstitial spread of tumor, severe bronchitis or interstitial pneumonitis. 4. Resolution of inflammatory bowel process. 5. Stable abdominal and pelvic lymphadenopathy. 6. Sclerotic bone metastasis noted in the manubrium of the sternum and also in the T8 vertebral body. There are also lytic lesions in L2 and L5. Electronically Signed   By: Marijo Sanes M.D.   On: 11/04/2017 15:19   Dg Chest Port 1 View  Result Date: 11/26/2017 CLINICAL DATA:  Fever EXAM: PORTABLE CHEST 1 VIEW COMPARISON:  11/23/2017 FINDINGS: Cardiomegaly. Bibasilar airspace opacities could reflect atelectasis or pneumonia. No effusions or acute bony abnormality. IMPRESSION: Bibasilar atelectasis or pneumonia. Stable cardiomegaly. Electronically Signed   By: Rolm Baptise M.D.   On: 11/26/2017 09:46   Dg Chest Port 1 View  Result Date: 11/19/2017 CLINICAL DATA:  Respiratory failure EXAM: PORTABLE CHEST 1 VIEW COMPARISON:  CT of 1 day prior.  Plain film of 1 day prior. FINDINGS: Left axillary node dissection. Midline trachea. Cardiomegaly accentuated by AP portable technique. Small right pleural effusion. No pneumothorax. Mild interstitial edema is decreased. No change in bibasilar airspace disease. IMPRESSION: Cardiomegaly with improved interstitial edema. Similar bibasilar airspace opacities, likely infection. Electronically Signed   By: Abigail Miyamoto M.D.   On: 11/19/2017 07:50   [place] Core Biopsy (lymph Nodes)  Result Date: 11/22/2017 CLINICAL DATA:  Right supraclavicular and axillary lymphadenopathy with history breast carcinoma. EXAM: ULTRASOUND GUIDED CORE  BIOPSY OF RIGHT SUPRACLAVICULAR LYMPH NODES MEDICATIONS: 1.5 mg IV Versed; 50 mcg IV Fentanyl Total Moderate Sedation Time: 26 minutes. The patient's level of consciousness and physiologic status were continuously monitored during the procedure by Radiology nursing. PROCEDURE: The procedure, risks, benefits, and alternatives were explained to the patient. Questions regarding the procedure were encouraged and answered. The patient understands and consents to the procedure. A time out was performed prior to initiating the procedure. Ultrasound was performed of the right lower neck and supraclavicular region. The right lower neck was prepped with chlorhexidine in a sterile fashion, and a sterile drape was applied covering the operative field. A sterile gown and sterile gloves were used for the procedure. Local anesthesia was provided with 1% Lidocaine. Under ultrasound guidance, a total of 5 separate 18 gauge core biopsy samples were obtained through different portions of enlarged right supraclavicular lymph nodes. Lymph nodes were submitted in formalin as well as on saline soaked Telfa. Additional ultrasound was performed after the biopsy. COMPLICATIONS: Some regional subcutaneous hemorrhage was present on completion of biopsy. SIR level A: No therapy, no consequence. FINDINGS: There are multiple abnormal appearing and enlarged lymph nodes in the right supraclavicular region. Core biopsy was performed through 2 adjacent abnormal lymph nodes. IMPRESSION: Ultrasound-guided core biopsy performed of enlarged right supraclavicular lymph nodes. Electronically Signed   By: Aletta Edouard M.D.   On: 11/22/2017 14:59    Assessment:   Melanie Holloway is a 52 y.o. female with hx distant Breast cancer, now being worked up by otpt oncology for lymphadenopathy (hilar, mediastinal, R intramammary and  R [place] and Axillary) as well as pericardial effusion, progressive pulmonary process. She also has pancytopenia and now fevers.  She  also has hx of lupus erythematosus, ANA > 1:1280 , but is not followed by rheumatology.   She is progressively ill over last several months with cough she reports beginning in Dec, night sweats, wt loss of 30-40 #s.  Differential dx is broad but I favor malignant or rheumatological process.  So far bcx, stool PCR,  C diff neg. Sputum cx pending.  MRSA PCR neg.  Flu PCR neg x 2. Has been seen by onc and pulmonary and multiple tests ordered.   Recommendations Agree with onc and pulm Wu Mycoplasma serology, Anca, legionella P Check urine strep Pna, fungal serology, upper resp PCR.  Cont zosyn and azithromycin. I think she needs bronch - will have CT scan done today. If bronched send for routine cx, Fungal, AFB smear and cx and viral culture Thank you very much for allowing me to participate in the care of this patient. Please call with questions.   Cheral Marker. Ola Spurr, MD

## 2017-11-29 NOTE — Plan of Care (Signed)
  Progressing Education: Knowledge of General Education information will improve 11/29/2017 1721 - Progressing by Rowe Robert, RN Health Behavior/Discharge Planning: Ability to manage health-related needs will improve 11/29/2017 1721 - Progressing by Rowe Robert, RN Clinical Measurements: Ability to maintain clinical measurements within normal limits will improve 11/29/2017 1721 - Progressing by Rowe Robert, RN Diagnostic test results will improve 11/29/2017 1721 - Progressing by Rowe Robert, RN Note ID saw today placed on droplet precautions and other labs ordered Cardiovascular complication will be avoided 11/29/2017 1721 - Progressing by Rowe Robert, RN Activity: Risk for activity intolerance will decrease 11/29/2017 1721 - Progressing by Rowe Robert, RN Nutrition: Adequate nutrition will be maintained 11/29/2017 1721 - Progressing by Rowe Robert, RN Coping: Level of anxiety will decrease 11/29/2017 1721 - Progressing by Rowe Robert, RN Elimination: Will not experience complications related to bowel motility 11/29/2017 1721 - Progressing by Rowe Robert, RN Will not experience complications related to urinary retention 11/29/2017 1721 - Progressing by Rowe Robert, RN Pain Managment: General experience of comfort will improve 11/29/2017 1721 - Progressing by Rowe Robert, RN Safety: Ability to remain free from injury will improve 11/29/2017 1721 - Progressing by Rowe Robert, RN Skin Integrity: Risk for impaired skin integrity will decrease 11/29/2017 1721 - Progressing by Rowe Robert, RN
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
## 2017-11-29 NOTE — Progress Notes (Signed)
[facility] Hematology/Oncology Progress Note  Date of admission: 11/18/2017  Hospital day:  11/29/2017  Chief Complaint: Melanie Holloway is a 52 y.o. female with a history of stage IIIA left breast cancer who was admitted through the emergency room with shortness of breath and acute respiratory failure.  Subjective:  Patient denies any new complaints.  Fevers continue.  Social History: The patient is accomapanied by nursing staff and later by Dr. Ashby Dawes today.  Allergies: No Known Allergies  Scheduled Medications: . azithromycin  500 mg Oral Daily  . budesonide (PULMICORT) nebulizer solution  0.5 mg Nebulization BID  . cholecalciferol  2,000 Units Oral Daily  . ferrous sulfate  325 mg Oral BID WC  . ipratropium-albuterol  3 mL Nebulization Q6H  . letrozole  2.5 mg Oral Daily  . methylPREDNISolone (SOLU-MEDROL) injection  60 mg Intravenous Q24H  . metoprolol tartrate  12.5 mg Oral BID  . multivitamin with minerals  1 tablet Oral Daily  . protein supplement shake  11 oz Oral BID BM  . sodium chloride flush  3 mL Intravenous Q12H    Review of Systems: GENERAL:  Fatigue.  Fevers and sweats (on/off for < 1 year).  Weight loss (20 pounds in 3 months). PERFORMANCE STATUS (ECOG):  2 HEENT:  No visual changes, runny nose, sore throat, mouth sores or tenderness. Lungs: No increased shortness of breath.  Mostly dry cough, but at times productive of white phlegm with specks of blood.  Cardiac:  No chest pain, palpitations, orthopnea, or PND. GI:  Post tussive emesis x 1.  No nausea, diarrhea, constipation, melena or hematochezia. GU:  No urgency, frequency, dysuria, or hematuria. Musculoskeletal:  No back pain.  No joint pain.  No muscle tenderness. Extremities:  No pain or swelling. Skin:  Rash on arm (chronic) s/p biopsy (lupus).  Denies photosensitivity.  No ulcers. Neuro:  Headache come and goes.  No numbness or weakness, balance or coordination  issues. Endocrine:  No diabetes, thyroid issues, hot flashes or night sweats. Psych:  No mood changes, depression or anxiety. Pain:  No focal pain. Review of systems:  All other systems reviewed and found to be negative  Physical Exam: Blood pressure 123/75, pulse 98, temperature 99.8 F (37.7 C), temperature source Oral, resp. rate 18, height 4' 11"  (1.499 m), weight 147 lb (66.7 kg), SpO2 95 %.  GENERAL:  Fatigued appearing woman sitting comfortably on the medical unit in no acute distress. MENTAL STATUS:  Alert and oriented to person, place and time. HEAD:  Normocephalic, atraumatic, face symmetric, no Cushingoid features. EYES:  Brown eyes.  Pupils equal round and reactive to light and accomodation.  No conjunctivitis or scleral icterus. ENT:  [place] in place.  Oropharynx clear without lesion.  Tongue normal.  Dentures.  Mucous membranes moist.  RESPIRATORY:  Scattered soft dry crackles. No wheezes or rhonchi. CARDIOVASCULAR:  Regular rate and rhythm without murmur, rub or gallop. ABDOMEN:  Soft, non-tender, with active bowel sounds, and no appreciable hepatosplenomegaly.  No masses. SKIN:  Brown speckled upper extremities with erythema.  No ulcers or lesions. EXTREMITIES: No edema, no skin discoloration or tenderness.  No palpable cords. LYMPH NODES: Small right axillary adenopathy.  No palpable cervical, supraclavicular, or inguinal adenopathy  NEUROLOGICAL: Unremarkable. PSYCH:  Appropriate.   Results for orders placed or performed during the hospital encounter of 11/18/17 (from the past 48 hour(s))  Lactic acid, plasma     Status: None   Collection Time: 11/28/17  8:11 AM  Result Value Ref Range   Lactic Acid, Venous 0.8 0.5 - 1.9 mmol/L    Comment: Performed at [facility], [address] [place], [place] [address]  CBC with Differential/Platelet     Status: Abnormal   Collection Time: 11/28/17  8:19 AM  Result Value Ref Range   WBC 3.0 (L) 3.6 - 11.0 K/uL   RBC  2.48 (L) 3.80 - 5.20 MIL/uL   Hemoglobin 7.2 (L) 12.0 - 16.0 g/dL   HCT 21.4 (L) 35.0 - 47.0 %   MCV 86.5 80.0 - 100.0 fL   MCH 29.0 26.0 - 34.0 pg   MCHC 33.5 32.0 - 36.0 g/dL   RDW 15.7 (H) 11.5 - 14.5 %   Platelets 68 (L) 150 - 440 K/uL   Neutrophils Relative % 66 %   Neutro Abs 2.0 1.4 - 6.5 K/uL   Lymphocytes Relative 28 %   Lymphs Abs 0.8 (L) 1.0 - 3.6 K/uL   Monocytes Relative 5 %   Monocytes Absolute 0.1 (L) 0.2 - 0.9 K/uL   Eosinophils Relative 0 %   Eosinophils Absolute 0.0 0 - 0.7 K/uL   Basophils Relative 1 %   Basophils Absolute 0.0 0 - 0.1 K/uL    Comment: Performed at [facility], [address] [place], [place] [address]  Basic metabolic panel     Status: Abnormal   Collection Time: 11/28/17  8:19 AM  Result Value Ref Range   Sodium 128 (L) 135 - 145 mmol/L   Potassium 3.9 3.5 - 5.1 mmol/L   Chloride 101 101 - 111 mmol/L   CO2 19 (L) 22 - 32 mmol/L   Glucose, Bld 93 65 - 99 mg/dL   BUN 46 (H) 6 - 20 mg/dL   Creatinine, Ser 1.79 (H) 0.44 - 1.00 mg/dL   Calcium 8.0 (L) 8.9 - 10.3 mg/dL   GFR calc non Af Amer 31 (L) >60 mL/min   GFR calc Af Amer 36 (L) >60 mL/min    Comment: (NOTE) The eGFR has been calculated using the CKD EPI equation. This calculation has not been validated in all clinical situations. eGFR's persistently <60 mL/min signify possible Chronic Kidney Disease.    Anion gap 8 5 - 15    Comment: Performed at [facility], [address] [place], [place] [address]  Lactate dehydrogenase     Status: Abnormal   Collection Time: 11/29/17  3:06 AM  Result Value Ref Range   LDH 320 (H) 98 - 192 U/L    Comment: Performed at [facility], [address] [place], [place] [address]  Uric acid     Status: None   Collection Time: 11/29/17  3:06 AM  Result Value Ref Range   Uric Acid, Serum 5.8 2.3 - 6.6 mg/dL    Comment: Performed at [facility], [address] [place], [place] [address]  CBC with  Differential     Status: Abnormal   Collection Time: 11/29/17  3:06 AM  Result Value Ref Range   WBC 3.8 3.6 - 11.0 K/uL   RBC 2.62 (L) 3.80 - 5.20 MIL/uL   Hemoglobin 7.7 (L) 12.0 - 16.0 g/dL   HCT 23.0 (L) 35.0 - 47.0 %   MCV 87.6 80.0 - 100.0 fL   MCH 29.6 26.0 - 34.0 pg   MCHC 33.7 32.0 - 36.0 g/dL   RDW 15.7 (H) 11.5 - 14.5 %   Platelets 76 (L) 150 - 440 K/uL   Neutrophils Relative %  68 %   Neutro Abs 2.6 1.4 - 6.5 K/uL   Lymphocytes Relative 28 %   Lymphs Abs 1.0 1.0 - 3.6 K/uL   Monocytes Relative 3 %   Monocytes Absolute 0.1 (L) 0.2 - 0.9 K/uL   Eosinophils Relative 0 %   Eosinophils Absolute 0.0 0 - 0.7 K/uL   Basophils Relative 1 %   Basophils Absolute 0.0 0 - 0.1 K/uL    Comment: Performed at [facility], [address] [place], [place] [address]  Basic metabolic panel     Status: Abnormal   Collection Time: 11/29/17  3:06 AM  Result Value Ref Range   Sodium 129 (L) 135 - 145 mmol/L   Potassium 3.9 3.5 - 5.1 mmol/L   Chloride 100 (L) 101 - 111 mmol/L   CO2 19 (L) 22 - 32 mmol/L   Glucose, Bld 107 (H) 65 - 99 mg/dL   BUN 45 (H) 6 - 20 mg/dL   Creatinine, Ser 1.70 (H) 0.44 - 1.00 mg/dL   Calcium 8.3 (L) 8.9 - 10.3 mg/dL   GFR calc non Af Amer 33 (L) >60 mL/min   GFR calc Af Amer 39 (L) >60 mL/min    Comment: (NOTE) The eGFR has been calculated using the CKD EPI equation. This calculation has not been validated in all clinical situations. eGFR's persistently <60 mL/min signify possible Chronic Kidney Disease.    Anion gap 10 5 - 15    Comment: Performed at [facility], [address] [place], [place] [address]  DAT, polyspecific, [facility] only)     Status: None   Collection Time: 11/29/17  3:06 AM  Result Value Ref Range   Polyspecific AHG test      POS Performed at [facility], [address] [place], [place] [address]    DAT, IgG      POS Performed at [facility], [address], [place], [place]  [address]    DAT, complement      NEG Performed at [facility], [address] [place], [place] [address]    Antibody ID,T Kathrynn Running AUTOANTIBODY Performed at [facility], [place] [address], [place], [place] [address]   Type and screen [place]     Status: None   Collection Time: 11/29/17  3:06 AM  Result Value Ref Range   ABO/RH(D) B POS    Antibody Screen NEG    Sample Expiration      12/02/2017 Performed at [facility], [address] [place], [place] [address]   Prepare Pheresed Platelets     Status: None (Preliminary result)   Collection Time: 11/30/17  3:00 AM  Result Value Ref Range   Unit Number [id number]    Blood Component Type PLTP LR3 PAS    Unit division 00    Status of Unit ALLOCATED    Transfusion Status      OK TO TRANSFUSE Performed at [facility], [place],  [address]    Ct Chest Wo Contrast  Result Date: 11/29/2017 CLINICAL DATA:  52 year old female with hemoptysis and cough. Metastatic breast cancer. EXAM: CT CHEST WITHOUT CONTRAST TECHNIQUE: Multidetector CT imaging of the chest was performed following the standard protocol without IV contrast. COMPARISON:  Chest CTA 11/18/2017 and earlier. FINDINGS: Cardiovascular: Allowing for absent IV contrast cardiomegaly and pericardial effusion appears stable since 11/18/2017. Vascular patency is not evaluated in the absence of  IV contrast. Calcified coronary artery atherosclerosis is evident. Lesser calcified aortic atherosclerosis. Mediastinum/Nodes: Extensive mediastinal and bilateral hilar lymphadenopathy has not significantly changed since 11/18/2017. Continued extensive right axillary lymphadenopathy, stable. Lungs/Pleura: Widespread bilateral centrilobular pulmonary ground-glass nodularity, less pronounced in the lung apices, largely new since 11/04/2017 and more extensive and nodular in appearance compared to 11/18/2017.  Continued confluent ground-glass opacity throughout the left lower lobe and right costophrenic angle. Decreased layering right pleural effusion since 11/18/2017. Stable to mildly decreased left sub pulmonic effusion. Areas of subpleural nodularity and thickening in the left lung which may be post radiation related in light of prior left mastectomy. Upper Abdomen: Stable visible noncontrast upper abdominal viscera. Musculoskeletal: Mixed lytic and sclerotic skeletal metastases appear stable since 11/04/2017. IMPRESSION: 1. Continued widespread abnormal predominantly ground-glass pulmonary opacity, similar in distribution although more nodular in appearance since 11/18/2017. Differential considerations are fairly broad and include infectious as well as noninfectious inflammation (including vasculitis), and lymphangitic carcinomatosis (in light of #2). Consider atypical infectious etiology if the patient is immunocompromised. 2. Continued extensive mediastinal and hilar lymphadenopathy. Continued extensive right axillary lymphadenopathy. 3. Cardiomegaly and pericardial effusion appear grossly stable since 11/18/2017. Layering right pleural effusion has regressed. Subpulmonic left pleural effusion is stable. 4. Stable osseous metastatic disease. Electronically Signed   By: Genevie Ann M.D.   On: 11/29/2017 16:26    Assessment:  Birtie Fellman is a 52 y.o. female with woman with a history of stage IIIA left breast cancer in 2009 s/p neoadjuvant chemotherapy followed by mastectomy, axillary node dissection, radiation and 5 years of hormonal therapy.  CA27.29 was normal on 09/13/2012  She was noted to have a right breast mass in 05/2017.  Biopsy of a right axillary lymph node on 05/21/2017 revealed reactive lymphoid hyperplasia.  Chest, abdomen, and pelvic CT on 11/04/2017 revealed enlarged right-sided intramammary lymph nodes and extensive right axillary and supraclavicular adenopathy. A discrete right breast mass  was  not identified.  There was mediastinal and hilar lymphadenopathy.  There was a nodular interstitial process in the lungs which could be interstitial spread of tumor, severe bronchitis or interstitial pneumonitis.  There was stable abdominal and pelvic lymphadenopathy.  There was sclerotic bone metastasis in the manubrium and T8 vertebral body. There were lytic lesions in L2 and L5.  Bone scan on 11/04/2017 revealed abnormal radiotracer activity in the right manubrium and T8 vertebral body compatible with the sclerotic metastatic disease.  There was no other osseous metastatic disease   Right supraclavicular biopsy on 11/22/2017 revealed no evidence of metastatic carcinoma.  There was no definitive evidence of a B cell lymphoma by morphology or flow cytometry.  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  She was noted to have pancytopenia on 09/08/2017.  Normal studies in 08/2017 included: LDH, B12, folate, and ferritin.  Rheumatoid factor and TSH were normal on 03/07/2015.  ACE level was on 01/16/2013.  Hepatitis B surface antibody on 09/22/2017. Retic was 2.4% on 11/08/2017.  PT was 15 (INR 1.19) on 11/22/2017.  Patient has a warm autoantibody.  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  Positive studies include: ANA > 1:1280 (speckled) on 09/08/2017.  Beta2 microglobin 9.1 on 09/08/2017.   Echo on 11/11/2017 revealed an EF of 45-50% with a moderate to large pericardial effusion.   Symptomatically, she remains fatigued.  She has lost 20 pounds in 3 months.  She has had on/off drenching sweats x < 1 year.  She has had spiking temperatures x  5 days.    Plan:   1.  Oncology:  Stage IIIA left breast cancer s/p treatment.  Restaging studies in 10/2017 concerning for metastatic disease.  Supraclavicular biopsy on 11/22/2017 was negative.  Will need to pursue additional biopsy.  Consider PET scan directed biopsy.  Will review images with radiology.  Awaiting call  back from patient's oncologist.  Differential also includes lymphoma.  Discussed with Dr Quay Burow, pathologist.  May require an excisional LN biopsy.  CA27.29 is pending.  2.  Hematology:  Patient with known beta-thalassemia.  Pancytopenia since 08/2017.  Patient has a high titer ANA (> 1:1280) and skin biopsy + lupus.  Patient may have a lupus anticoagulant and/or consumption with fever/infection. Waiting call back from rheumatologist.  Patient has a warm autoantibody and thus may have autoimmune hemolytic anemia and autoimmune thrombocytopenia (ITP).  Patient started on Solumedrol 60 mg IV daily beginning this afternoon.  Maintain active type and screen.  Transfuse leukopoor irradiated PRBC if hemoglobin < 7.0.  Await results of additional labs drawn:  ACE level, beta 2 glycoprotein antibodies, anti-cardiolipin antibodies, hepatitis B core antibody, hepatitis C antibody, HIV antibody, lupus anticoagulant panel, and SPEP.  Uric acid normal.  LDH elevated.  3.  Infectious disease:  Patient with high spiking temperatures last few days without source.  Patient on broad spectrum antibiotics (Zosyn and azithromycin).   Extensive work-up underway (mycoplasma serology, Legionella, urine strept pneumonia, fungal serology, fungal culture, AFB, quantiferon gold, upper respiratory PCR, viral culture).  Possible tumor related fever.  Appreciate ID consult.  4.  Pulmonary:  Patient with nodular interstitial process in lungs.  Etiology unclear.  Additional testing per pulmonary medicine (quantiferon gold, Legionella,  Mycoplasma) and infectious disease.  Bronchoscopy planned.   Lequita Asal, MD  11/29/2017, 6:55 PM

## 2017-11-29 NOTE — Telephone Encounter (Deleted)
Patient did not show for her [facility] appointment on 11/29/17. Will attempt to reschedule.
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
## 2017-11-30 ENCOUNTER — Encounter
Admission: EM | Disposition: A | Discharge status: Home Health | Payer: Self-pay | Source: Home / Self Care | Attending: Internal Medicine

## 2017-11-30 ENCOUNTER — Other Ambulatory Visit: Payer: Self-pay | Admitting: Hematology and Oncology

## 2017-11-30 ENCOUNTER — Inpatient Hospital Stay: Payer: 59

## 2017-11-30 ENCOUNTER — Other Ambulatory Visit: Payer: Self-pay | Admitting: Oncology

## 2017-11-30 DIAGNOSIS — R935 Abnormal findings on diagnostic imaging of other abdominal regions, including retroperitoneum: Secondary | ICD-10-CM

## 2017-11-30 HISTORY — PX: FLEXIBLE BRONCHOSCOPY: SHX5094

## 2017-11-30 LAB — CANCER ANTIGEN 27.29: CA 27.29: 53.7 U/mL — ABNORMAL HIGH (ref 0.0–38.6)

## 2017-11-30 LAB — HIV ANTIBODY (ROUTINE TESTING W REFLEX): HIV Screen 4th Generation wRfx: NONREACTIVE

## 2017-11-30 LAB — CBC WITH DIFFERENTIAL/PLATELET
Basophils Absolute: 0 10*3/uL (ref 0–0.1)
Basophils Relative: 1 %
Eosinophils Absolute: 0 10*3/uL (ref 0–0.7)
Eosinophils Relative: 0 %
HCT: 20.8 % — ABNORMAL LOW (ref 35.0–47.0)
Hemoglobin: 6.9 g/dL — ABNORMAL LOW (ref 12.0–16.0)
Lymphocytes Relative: 21 %
Lymphs Abs: 0.4 10*3/uL — ABNORMAL LOW (ref 1.0–3.6)
MCH: 28.6 pg (ref 26.0–34.0)
MCHC: 33.2 g/dL (ref 32.0–36.0)
MCV: 86.2 fL (ref 80.0–100.0)
Monocytes Absolute: 0.1 10*3/uL — ABNORMAL LOW (ref 0.2–0.9)
Monocytes Relative: 7 %
Neutro Abs: 1.5 10*3/uL (ref 1.4–6.5)
Neutrophils Relative %: 71 %
Platelets: 108 10*3/uL — ABNORMAL LOW (ref 150–440)
RBC: 2.41 MIL/uL — ABNORMAL LOW (ref 3.80–5.20)
RDW: 15.6 % — ABNORMAL HIGH (ref 11.5–14.5)
WBC: 2 10*3/uL — ABNORMAL LOW (ref 3.6–11.0)

## 2017-11-30 LAB — RESPIRATORY PANEL BY PCR

## 2017-11-30 LAB — BASIC METABOLIC PANEL
Anion gap: 10 (ref 5–15)
BUN: 56 mg/dL — ABNORMAL HIGH (ref 6–20)
CO2: 18 mmol/L — ABNORMAL LOW (ref 22–32)
Calcium: 8.2 mg/dL — ABNORMAL LOW (ref 8.9–10.3)
Chloride: 101 mmol/L (ref 101–111)
Creatinine, Ser: 2.2 mg/dL — ABNORMAL HIGH (ref 0.44–1.00)
GFR calc Af Amer: 28 mL/min — ABNORMAL LOW (ref >60)
GFR calc non Af Amer: 24 mL/min — ABNORMAL LOW (ref >60)
Glucose, Bld: 136 mg/dL — ABNORMAL HIGH (ref 65–99)
Potassium: 3.4 mmol/L — ABNORMAL LOW (ref 3.5–5.1)
Sodium: 129 mmol/L — ABNORMAL LOW (ref 135–145)

## 2017-11-30 LAB — EXPECTORATED SPUTUM ASSESSMENT W REFEX TO RESP CULTURE

## 2017-11-30 LAB — LUPUS ANTICOAGULANT PANEL
DRVVT: 41.2 s (ref 0.0–47.0)
PTT Lupus Anticoagulant: 34.5 s (ref 0.0–51.9)

## 2017-11-30 LAB — EXPECTORATED SPUTUM ASSESSMENT W GRAM STAIN, RFLX TO RESP C

## 2017-11-30 LAB — PREPARE RBC (CROSSMATCH)

## 2017-11-30 LAB — CARDIOLIPIN ANTIBODIES, IGG, IGM, IGA
Anticardiolipin IgA: <9 APL U/mL (ref 0–11)
Anticardiolipin IgG: <9 GPL U/mL (ref 0–14)
Anticardiolipin IgM: 16 MPL U/mL — ABNORMAL HIGH (ref 0–12)

## 2017-11-30 LAB — ANGIOTENSIN CONVERTING ENZYME: Angiotensin-Converting Enzyme: 64 U/L (ref 14–82)

## 2017-11-30 LAB — LEGIONELLA PNEUMOPHILA SEROGP 1 UR AG: L. PNEUMOPHILA SEROGP 1 UR AG: NEGATIVE

## 2017-11-30 LAB — ABO/RH: ABO/RH(D): B POS

## 2017-11-30 LAB — HEPATITIS B CORE ANTIBODY, TOTAL: Hep B Core Total Ab: NEGATIVE

## 2017-11-30 LAB — HEPATITIS C ANTIBODY: HCV Ab: <0.1 s/co ratio (ref 0.0–0.9)

## 2017-11-30 SURGERY — BRONCHOSCOPY, FLEXIBLE
Anesthesia: Moderate Sedation

## 2017-11-30 MED ORDER — BUTAMBEN-TETRACAINE-BENZOCAINE 2-2-14 % EX AERO
1.0000 | INHALATION_SPRAY | Freq: Once | CUTANEOUS | Status: DC
  Filled 2017-11-30: qty 20

## 2017-11-30 MED ORDER — MIDAZOLAM HCL 2 MG/2ML IJ SOLN
INTRAMUSCULAR | Status: AC | PRN
  Administered 2017-11-30 (×2): 2 mg via INTRAVENOUS

## 2017-11-30 MED ORDER — SODIUM CHLORIDE 0.9 % IV SOLN: Freq: Once | INTRAVENOUS | Status: DC

## 2017-11-30 MED ORDER — MIDAZOLAM HCL 5 MG/5ML IJ SOLN
INTRAMUSCULAR | Status: AC
  Filled 2017-11-30: qty 10

## 2017-11-30 MED ORDER — FENTANYL CITRATE (PF) 100 MCG/2ML IJ SOLN
INTRAMUSCULAR | Status: AC
  Filled 2017-11-30: qty 4

## 2017-11-30 MED ORDER — FENTANYL CITRATE (PF) 100 MCG/2ML IJ SOLN
INTRAMUSCULAR | Status: AC | PRN
  Administered 2017-11-30: 25 ug via INTRAVENOUS
  Administered 2017-11-30: 50 ug via INTRAVENOUS

## 2017-11-30 MED ORDER — LIDOCAINE HCL 2 % EX GEL
1.0000 "application " | Freq: Once | CUTANEOUS | Status: DC
  Filled 2017-11-30: qty 5

## 2017-11-30 MED ORDER — PHENYLEPHRINE HCL 0.25 % NA SOLN
1.0000 | Freq: Four times a day (QID) | NASAL | Status: DC | PRN
  Filled 2017-11-30: qty 15

## 2017-11-30 NOTE — Progress Notes (Signed)
Patient ID: Melanie Holloway, female   DOB: 03-Oct-1966, 52 y.o.   MRN: [id number]  [facility] PROGRESS NOTE  Melanie Holloway XTK:[id number] DOB: Nov 18, 1965 DOA: 11/18/2017 PCP: Ria Bush, MD  HPI/Subjective:  Febrile again today morning.  Shortness of breath is improved.  On 2 L oxygen.  Bronchoscopy later today.  Objective: Vitals:   11/30/17 0604 11/30/17 0952  BP:  129/75  Pulse:  93  Resp:  20  Temp: 99.5 F (37.5 C) 98.3 F (36.8 C)  SpO2:  100%    Filed Weights   11/28/17 0625 11/29/17 0947 11/30/17 0500  Weight: 67.1 kg (147 lb 14.9 oz) 66.7 kg (147 lb) 62.8 kg (138 lb 7.2 oz)    ROS: Review of Systems  Constitutional: Positive for malaise/fatigue. Negative for chills and fever.  HENT: Negative for congestion and sore throat.   Eyes: Negative for blurred vision.  Respiratory: Positive for cough, shortness of breath and wheezing.   Cardiovascular: Positive for orthopnea. Negative for chest pain.  Gastrointestinal: Positive for diarrhea. Negative for abdominal pain, constipation, nausea and vomiting.  Genitourinary: Negative for dysuria.  Musculoskeletal: Negative for joint pain.  Neurological: Negative for dizziness and headaches.   Exam: Physical Exam  HENT:  Nose: No mucosal edema.  Mouth/Throat: No oropharyngeal exudate or posterior oropharyngeal edema.  Eyes: Conjunctivae, EOM and lids are normal. Pupils are equal, round, and reactive to light.  Neck: No JVD present. Carotid bruit is not present. No edema present. No thyroid mass and no thyromegaly present.  Cardiovascular: S1 normal and S2 normal. Exam reveals no gallop.  No murmur heard. Pulses:      Dorsalis pedis pulses are 2+ on the right side, and 2+ on the left side.  Respiratory: No respiratory distress. She has decreased breath sounds in the right lower field and the left lower field. She has wheezes in the right lower field and the left lower field. She has no rhonchi. She has no rales.  GI:  Soft. Bowel sounds are normal. There is no tenderness.  Musculoskeletal:       Right ankle: She exhibits swelling.       Left ankle: She exhibits swelling.  Lymphadenopathy:    She has no cervical adenopathy.  Neurological: She is alert. No cranial nerve deficit.  Skin: Skin is warm. No rash noted. Nails show no clubbing.  Psychiatric: She has a normal mood and affect.   Data Reviewed: Basic Metabolic Panel: Recent Labs  Lab 11/24/17 0959 11/25/17 0718 11/26/17 0805 11/28/17 0819 11/29/17 0306  NA  --  131* 131* 128* 129*  K  --  4.0 4.0 3.9 3.9  CL  --  103 103 101 100*  CO2  --  21* 20* 19* 19*  GLUCOSE  --  102* 91 93 107*  BUN  --  41* 39* 46* 45*  CREATININE 1.39* 1.32* 1.55* 1.79* 1.70*  CALCIUM  --  8.5* 8.3* 8.0* 8.3*   CBC: Recent Labs  Lab 11/25/17 0718 11/26/17 0805 11/28/17 0819 11/29/17 0306  WBC 2.2* 2.5* 3.0* 3.8  NEUTROABS  --  1.3* 2.0 2.6  HGB 7.7* 8.2* 7.2* 7.7*  HCT 23.0* 24.3* 21.4* 23.0*  MCV 86.3 86.6 86.5 87.6  PLT 62* 67* 68* 76*   Cardiac Enzymes: No results for input(s): CKTOTAL, CKMB, CKMBINDEX, TROPONINI in the last 168 hours. BNP (last 3 results) Recent Labs    11/18/17 1715 11/20/17 0405  BNP 2,975.0* 852.0*     CBG: No results for input(s):  GLUCAP in the last 168 hours.  Recent Results (from the past 240 hour(s))  MRSA PCR Screening     Status: None   Collection Time: 11/23/17  5:41 PM  Result Value Ref Range Status   MRSA by PCR NEGATIVE NEGATIVE Final    Comment:        The GeneXpert MRSA Assay (FDA approved for NASAL specimens only), is one component of a comprehensive MRSA colonization surveillance program. It is not intended to diagnose MRSA infection nor to guide or monitor treatment for MRSA infections. Performed at [facility], [address] [place], [place] [address]   Gastrointestinal Panel by PCR , Stool     Status: None   Collection Time: 11/26/17  8:12 AM  Result Value Ref Range Status    Campylobacter species NOT DETECTED NOT DETECTED Final   Plesimonas shigelloides NOT DETECTED NOT DETECTED Final   Salmonella species NOT DETECTED NOT DETECTED Final   Yersinia enterocolitica NOT DETECTED NOT DETECTED Final   Vibrio species NOT DETECTED NOT DETECTED Final   Vibrio cholerae NOT DETECTED NOT DETECTED Final   Enteroaggregative E coli (EAEC) NOT DETECTED NOT DETECTED Final   Enteropathogenic E coli (EPEC) NOT DETECTED NOT DETECTED Final   Enterotoxigenic E coli (ETEC) NOT DETECTED NOT DETECTED Final   Shiga like toxin producing E coli (STEC) NOT DETECTED NOT DETECTED Final   Shigella/Enteroinvasive E coli (EIEC) NOT DETECTED NOT DETECTED Final   Cryptosporidium NOT DETECTED NOT DETECTED Final   Cyclospora cayetanensis NOT DETECTED NOT DETECTED Final   Entamoeba histolytica NOT DETECTED NOT DETECTED Final   Giardia lamblia NOT DETECTED NOT DETECTED Final   Adenovirus F40/41 NOT DETECTED NOT DETECTED Final   Astrovirus NOT DETECTED NOT DETECTED Final   Norovirus GI/GII NOT DETECTED NOT DETECTED Final   Rotavirus A NOT DETECTED NOT DETECTED Final   Sapovirus (I, II, IV, and V) NOT DETECTED NOT DETECTED Final    Comment: Performed at [facility], [address] [place], [place] [address]  C difficile quick scan w PCR reflex     Status: None   Collection Time: 11/26/17  8:12 AM  Result Value Ref Range Status   C Diff antigen NEGATIVE NEGATIVE Final   C Diff toxin NEGATIVE NEGATIVE Final   C Diff interpretation No C. difficile detected.  Final    Comment: Performed at [facility], [address] [place], [place] [address]  CULTURE, BLOOD (ROUTINE X 2) w Reflex to ID Panel     Status: None (Preliminary result)   Collection Time: 11/27/17  8:58 AM  Result Value Ref Range Status   Specimen Description BLOOD RIGHT ANTECUBITAL  Final   Special Requests   Final    BOTTLES DRAWN AEROBIC AND ANAEROBIC Blood Culture adequate volume   Culture   Final    NO  GROWTH 3 DAYS Performed at [facility], [address] [place], [place] [address]    Report Status PENDING  Incomplete  CULTURE, BLOOD (ROUTINE X 2) w Reflex to ID Panel     Status: None (Preliminary result)   Collection Time: 11/27/17 11:03 AM  Result Value Ref Range Status   Specimen Description BLOOD RIGHT ANTECUBITAL  Final   Special Requests   Final    BOTTLES DRAWN AEROBIC AND ANAEROBIC Blood Culture adequate volume   Culture   Final    NO GROWTH 3 DAYS Performed at [facility], [address] [place], [place] [address]  Report Status PENDING  Incomplete  Culture, expectorated sputum-assessment     Status: None   Collection Time: 11/29/17  9:35 PM  Result Value Ref Range Status   Specimen Description SPUTUM  Final   Special Requests NONE  Final   Sputum evaluation   Final    THIS SPECIMEN IS ACCEPTABLE FOR SPUTUM CULTURE Performed at [facility], [address] [place], [place] [address]    Report Status 11/30/2017 FINAL  Final      Scheduled Meds: . azithromycin  500 mg Oral Daily  . budesonide (PULMICORT) nebulizer solution  0.5 mg Nebulization BID  . cholecalciferol  2,000 Units Oral Daily  . ferrous sulfate  325 mg Oral BID WC  . ipratropium-albuterol  3 mL Nebulization Q6H  . letrozole  2.5 mg Oral Daily  . methylPREDNISolone (SOLU-MEDROL) injection  60 mg Intravenous Q24H  . metoprolol tartrate  12.5 mg Oral BID  . multivitamin with minerals  1 tablet Oral Daily  . protein supplement shake  11 oz Oral BID BM  . sodium chloride flush  3 mL Intravenous Q12H   Continuous Infusions: . sodium chloride    . sodium chloride    . piperacillin-tazobactam (ZOSYN)  IV 3.375 g (11/30/17 0930)    Assessment/Plan:  * HCAP with on going fever- some improvement today Continues to have fevers.  Hemoptysis.    Has acute hypoxic respiratory failure.  Discussed with Dr. Ola Spurr.   On IV Zosyn and azithromycin. Bronchoscopy  today Repeat CT scan of the chest continues to show bilateral infiltrates.  * Will need to transfer to stepdown if any worsening.  Presently on 2 L oxygen. Will add scheduled nebulizers and IV steroids.  First dose stat  * Acute mid range congestive heart failure with EF 45-50%.  Moderate to large pericardial effusion.  Improved.  * Acute kidney injury on chronic kidney disease stage III.   * Metastatic breast cancer. LN biopsy negative for breast cancer.  .  Patient also has a pericardial effusion and lymphadenopathy seen on the chest. Malignant?  * Essential hypertension.  Blood pressure on the lower side.  Holding all medications except for lower dose Coreg at this point  * Weakness.  Physical therapy recommended home health.  * Pancytopenia with cancer history- Appreciate oncology input. Waiting on labs  Code Status:     Code Status Orders  (From admission, onward)        Start     Ordered   11/18/17 2216  Full code  Continuous     11/18/17 2217    Code Status History    Date Active Date Inactive Code Status Order ID Comments User Context   09/22/2017 03:26 09/22/2017 19:40 Full Code [id number]  Lance Coon, MD Inpatient     Critical care Time spent: 35 minutes  [place] Kyrollos Cordell  [employer]

## 2017-11-30 NOTE — Progress Notes (Signed)
[place] INFECTIOUS DISEASE PROGRESS NOTE Date of Admission:  11/18/2017     ID: Melanie Holloway is a 52 y.o. female with fevers, LAN  Active Problems:   Respiratory failure (HCC)  Subjective: Temp 102 yest, wbc down to 2. Getting Bronch today.   ROS  Eleven systems are reviewed and negative except per hpi  Medications:  Antibiotics Given (last 72 hours)    Date/Time Action Medication Dose Rate   11/27/17 1449 New Bag/Given   piperacillin-tazobactam (ZOSYN) IVPB 3.375 g 3.375 g 12.5 mL/hr   11/27/17 2209 New Bag/Given   piperacillin-tazobactam (ZOSYN) IVPB 3.375 g 3.375 g 12.5 mL/hr   11/28/17 1610 New Bag/Given   piperacillin-tazobactam (ZOSYN) IVPB 3.375 g 3.375 g 12.5 mL/hr   11/28/17 1654 New Bag/Given   piperacillin-tazobactam (ZOSYN) IVPB 3.375 g 3.375 g 12.5 mL/hr   11/28/17 1654 Given   azithromycin (ZITHROMAX) tablet 500 mg 500 mg    11/28/17 2053 New Bag/Given   piperacillin-tazobactam (ZOSYN) IVPB 3.375 g 3.375 g 12.5 mL/hr   11/29/17 0441 New Bag/Given   piperacillin-tazobactam (ZOSYN) IVPB 3.375 g 3.375 g 12.5 mL/hr   11/29/17 0948 Given   azithromycin (ZITHROMAX) tablet 500 mg 500 mg    11/29/17 1515 New Bag/Given   piperacillin-tazobactam (ZOSYN) IVPB 3.375 g 3.375 g 12.5 mL/hr   11/29/17 2238 New Bag/Given   piperacillin-tazobactam (ZOSYN) IVPB 3.375 g 3.375 g 12.5 mL/hr   11/30/17 0930 New Bag/Given   piperacillin-tazobactam (ZOSYN) IVPB 3.375 g 3.375 g 12.5 mL/hr     . azithromycin  500 mg Oral Daily  . budesonide (PULMICORT) nebulizer solution  0.5 mg Nebulization BID  . butamben-tetracaine-benzocaine  1 spray Topical Once  . cholecalciferol  2,000 Units Oral Daily  . fentaNYL      . ferrous sulfate  325 mg Oral BID WC  . ipratropium-albuterol  3 mL Nebulization Q6H  . letrozole  2.5 mg Oral Daily  . lidocaine  1 application Topical Once  . methylPREDNISolone (SOLU-MEDROL) injection  60 mg Intravenous Q24H  . metoprolol tartrate  12.5 mg Oral  BID  . midazolam      . multivitamin with minerals  1 tablet Oral Daily  . protein supplement shake  11 oz Oral BID BM  . sodium chloride flush  3 mL Intravenous Q12H    Objective: Vital signs in last 24 hours: Temp:  [98.1 F (36.7 C)-102.3 F (39.1 C)] 98.4 F (36.9 C) (02/12 1443) Pulse Rate:  [80-110] 86 (02/12 1443) Resp:  [16-37] 18 (02/12 1443) BP: (112-170)/(73-90) 117/74 (02/12 1443) SpO2:  [89 %-100 %] 100 % (02/12 1443) Weight:  [62.8 kg (138 lb 7.2 oz)] 62.8 kg (138 lb 7.2 oz) (02/12 0500) Constitutional:  oriented to person, place, and time. Ill appearing, mild resp distress HENT: [place]/AT, PERRLA, no scleral icterus Mouth/Throat: Oropharynx is clear and dry . No oropharyngeal exudate.  Cardiovascular: Tachy, gallop Pulmonary/Chest: poor air movement, rhonchi throughout Neck = supple, no nuchal rigidity Abdominal: Soft. Bowel sounds are normal.  exhibits no distension. There is no tenderness.  Lymphadenopathy: no cervical adenopathy. No axillary adenopathy Neurological: alert and oriented to person, place, and time.  Skin: Skin is warm and dry. No rash noted. No erythema.  Ext 1+ edema bil LE Psychiatric: a normal mood and affect.  behavior is normal.    Lab Results Recent Labs    11/29/17 0306 11/30/17 1231  WBC 3.8 2.0*  HGB 7.7* 6.9*  HCT 23.0* 20.8*  NA 129* 129*  K 3.9  3.4*  CL 100* 101  CO2 19* 18*  BUN 45* 56*  CREATININE 1.70* 2.20*    Microbiology: Results for orders placed or performed during the hospital encounter of 11/18/17  MRSA PCR Screening     Status: None   Collection Time: 11/18/17  9:50 PM  Result Value Ref Range Status   MRSA by PCR NEGATIVE NEGATIVE Final    Comment:        The GeneXpert MRSA Assay (FDA approved for NASAL specimens only), is one component of a comprehensive MRSA colonization surveillance program. It is not intended to diagnose MRSA infection nor to guide or monitor treatment for MRSA  infections. Performed at [facility], [address] [place], [place] [address]   MRSA PCR Screening     Status: None   Collection Time: 11/23/17  5:41 PM  Result Value Ref Range Status   MRSA by PCR NEGATIVE NEGATIVE Final    Comment:        The GeneXpert MRSA Assay (FDA approved for NASAL specimens only), is one component of a comprehensive MRSA colonization surveillance program. It is not intended to diagnose MRSA infection nor to guide or monitor treatment for MRSA infections. Performed at [facility], [address] [place], [place] [address]   Gastrointestinal Panel by PCR , Stool     Status: None   Collection Time: 11/26/17  8:12 AM  Result Value Ref Range Status   Campylobacter species NOT DETECTED NOT DETECTED Final   Plesimonas shigelloides NOT DETECTED NOT DETECTED Final   Salmonella species NOT DETECTED NOT DETECTED Final   Yersinia enterocolitica NOT DETECTED NOT DETECTED Final   Vibrio species NOT DETECTED NOT DETECTED Final   Vibrio cholerae NOT DETECTED NOT DETECTED Final   Enteroaggregative E coli (EAEC) NOT DETECTED NOT DETECTED Final   Enteropathogenic E coli (EPEC) NOT DETECTED NOT DETECTED Final   Enterotoxigenic E coli (ETEC) NOT DETECTED NOT DETECTED Final   Shiga like toxin producing E coli (STEC) NOT DETECTED NOT DETECTED Final   Shigella/Enteroinvasive E coli (EIEC) NOT DETECTED NOT DETECTED Final   Cryptosporidium NOT DETECTED NOT DETECTED Final   Cyclospora cayetanensis NOT DETECTED NOT DETECTED Final   Entamoeba histolytica NOT DETECTED NOT DETECTED Final   Giardia lamblia NOT DETECTED NOT DETECTED Final   Adenovirus F40/41 NOT DETECTED NOT DETECTED Final   Astrovirus NOT DETECTED NOT DETECTED Final   Norovirus GI/GII NOT DETECTED NOT DETECTED Final   Rotavirus A NOT DETECTED NOT DETECTED Final   Sapovirus (I, II, IV, and V) NOT DETECTED NOT DETECTED Final    Comment: Performed at [facility], [address] [place], [place] [address]  C difficile quick scan w PCR reflex     Status: None   Collection Time: 11/26/17  8:12 AM  Result Value Ref Range Status   C Diff antigen NEGATIVE NEGATIVE Final   C Diff toxin NEGATIVE NEGATIVE Final   C Diff interpretation No C. difficile detected.  Final    Comment: Performed at [facility], [address] [place], [place] [address]  CULTURE, BLOOD (ROUTINE X 2) w Reflex to ID Panel     Status: None (Preliminary result)   Collection Time: 11/27/17  8:58 AM  Result Value Ref Range Status   Specimen Description BLOOD RIGHT ANTECUBITAL  Final   Special Requests   Final    BOTTLES DRAWN AEROBIC AND ANAEROBIC Blood Culture adequate volume   Culture   Final  NO GROWTH 3 DAYS Performed at [facility], [address] [place], [place] [address]    Report Status PENDING  Incomplete  CULTURE, BLOOD (ROUTINE X 2) w Reflex to ID Panel     Status: None (Preliminary result)   Collection Time: 11/27/17 11:03 AM  Result Value Ref Range Status   Specimen Description BLOOD RIGHT ANTECUBITAL  Final   Special Requests   Final    BOTTLES DRAWN AEROBIC AND ANAEROBIC Blood Culture adequate volume   Culture   Final    NO GROWTH 3 DAYS Performed at [facility], [address] [place], [place] [address]    Report Status PENDING  Incomplete  Respiratory Panel by PCR     Status: None   Collection Time: 11/29/17  5:30 PM  Result Value Ref Range Status   Adenovirus NOT DETECTED NOT DETECTED Final   Coronavirus 229E NOT DETECTED NOT DETECTED Final   Coronavirus HKU1 NOT DETECTED NOT DETECTED Final   Coronavirus NL63 NOT DETECTED NOT DETECTED Final   Coronavirus OC43 NOT DETECTED NOT DETECTED Final   Metapneumovirus NOT DETECTED NOT DETECTED Final   Rhinovirus / Enterovirus NOT DETECTED NOT DETECTED Final   Influenza A NOT DETECTED NOT DETECTED Final   Influenza A H1 NOT DETECTED NOT DETECTED Final   Influenza A H1 2009 NOT DETECTED NOT  DETECTED Final   Influenza A H3 NOT DETECTED NOT DETECTED Final   Influenza B NOT DETECTED NOT DETECTED Final   Parainfluenza Virus 1 NOT DETECTED NOT DETECTED Final   Parainfluenza Virus 2 NOT DETECTED NOT DETECTED Final   Parainfluenza Virus 3 NOT DETECTED NOT DETECTED Final   Parainfluenza Virus 4 NOT DETECTED NOT DETECTED Final   Respiratory Syncytial Virus NOT DETECTED NOT DETECTED Final   Bordetella pertussis NOT DETECTED NOT DETECTED Final   Chlamydophila pneumoniae NOT DETECTED NOT DETECTED Final   Mycoplasma pneumoniae NOT DETECTED NOT DETECTED Final    Comment: Performed at [facility], [place] [address] [place], [place] [address]  Culture, expectorated sputum-assessment     Status: None   Collection Time: 11/29/17  9:35 PM  Result Value Ref Range Status   Specimen Description SPUTUM  Final   Special Requests NONE  Final   Sputum evaluation   Final    THIS SPECIMEN IS ACCEPTABLE FOR SPUTUM CULTURE Performed at [facility], [address], [place], [place] [address]    Report Status 11/30/2017 FINAL  Final  Culture, respiratory (NON-Expectorated)     Status: None (Preliminary result)   Collection Time: 11/29/17  9:35 PM  Result Value Ref Range Status   Specimen Description   Final    SPUTUM Performed at [facility], [address] [place], [place] [address]    Special Requests   Final    NONE Reflexed from [phone] Performed at [facility], [address] [place], [place] [address]    Gram Stain   Final    RARE WBC PRESENT, PREDOMINANTLY PMN FEW YEAST RARE GRAM POSITIVE COCCI IN PAIRS Performed at [facility], [place] [address] [place], [place] [address]    Culture PENDING  Incomplete   Report Status PENDING  Incomplete    Studies/Results: Dg Chest 1 View  Result Date: 11/30/2017 CLINICAL DATA:  Post bronchoscopy chest x-ray. History of breast malignancy, anemia, and abnormal chest CT findings on November 29, 2017.  EXAM: CHEST 1 VIEW COMPARISON:  Chest x-ray of November 26, 2017. FINDINGS: The lungs  are adequately inflated. There is no pneumothorax. The interstitial markings are increased bilaterally and more conspicuous than on the previous study. The hemidiaphragms remain visible. The cardiac silhouette remains enlarged. The pulmonary vascularity is mildly engorged. The trachea is midline. There surgical clips in the left axillary region. IMPRESSION: No postprocedure pneumothorax. Interstitial changes in the setting of cardiomegaly and pulmonary vascular congestion is consistent with mild CHF. Electronically Signed   By: Rollins Wrightson  [place] M.D.   On: 11/30/2017 14:39   Ct Chest Wo Contrast  Result Date: 11/29/2017 CLINICAL DATA:  52 year old female with hemoptysis and cough. Metastatic breast cancer. EXAM: CT CHEST WITHOUT CONTRAST TECHNIQUE: Multidetector CT imaging of the chest was performed following the standard protocol without IV contrast. COMPARISON:  Chest CTA 11/18/2017 and earlier. FINDINGS: Cardiovascular: Allowing for absent IV contrast cardiomegaly and pericardial effusion appears stable since 11/18/2017. Vascular patency is not evaluated in the absence of IV contrast. Calcified coronary artery atherosclerosis is evident. Lesser calcified aortic atherosclerosis. Mediastinum/Nodes: Extensive mediastinal and bilateral hilar lymphadenopathy has not significantly changed since 11/18/2017. Continued extensive right axillary lymphadenopathy, stable. Lungs/Pleura: Widespread bilateral centrilobular pulmonary ground-glass nodularity, less pronounced in the lung apices, largely new since 11/04/2017 and more extensive and nodular in appearance compared to 11/18/2017. Continued confluent ground-glass opacity throughout the left lower lobe and right costophrenic angle. Decreased layering right pleural effusion since 11/18/2017. Stable to mildly decreased left sub pulmonic effusion. Areas of subpleural nodularity and  thickening in the left lung which may be post radiation related in light of prior left mastectomy. Upper Abdomen: Stable visible noncontrast upper abdominal viscera. Musculoskeletal: Mixed lytic and sclerotic skeletal metastases appear stable since 11/04/2017. IMPRESSION: 1. Continued widespread abnormal predominantly ground-glass pulmonary opacity, similar in distribution although more nodular in appearance since 11/18/2017. Differential considerations are fairly broad and include infectious as well as noninfectious inflammation (including vasculitis), and lymphangitic carcinomatosis (in light of #2). Consider atypical infectious etiology if the patient is immunocompromised. 2. Continued extensive mediastinal and hilar lymphadenopathy. Continued extensive right axillary lymphadenopathy. 3. Cardiomegaly and pericardial effusion appear grossly stable since 11/18/2017. Layering right pleural effusion has regressed. Subpulmonic left pleural effusion is stable. 4. Stable osseous metastatic disease. Electronically Signed   By: Genevie Ann M.D.   On: 11/29/2017 16:26   Dg C-arm 1-60 Min-no Report  Result Date: 11/30/2017 Fluoroscopy was utilized by the requesting physician.  No radiographic interpretation.    Assessment/Plan: Melanie Holloway is a 52 y.o. female with hx distant Breast cancer, now being worked up by otpt oncology for lymphadenopathy (hilar, mediastinal, R intramammary and  R [place] and Axillary) as well as pericardial effusion, progressive pulmonary process. She also has pancytopenia and now fevers.  She also has hx of lupus erythematosus, ANA > 1:1280 , but is not followed by rheumatology.   She is progressively ill over last several months with cough she reports beginning in Dec, night sweats, wt loss of 30-40 #s.  Differential dx is broad but I favor malignant or rheumatological process.  So far bcx neg, stool PCR,  C diff neg. Sputum cx pending. MRSA PCR neg.  Flu PCR neg x 2.  Resp PCR neg, ACE  nml 2/12 -  Remains febrile, getting bronch. Repeat CT shows contined ground glass and nodular infiltrates. Pending - Mycoplasma serology, Anca, antigens for legionella urine strep Pna, fungal serology,  Recommendations Cont zosyn and azithromycin. Agree with bronch  - please  send for routine cx, Fungal, AFB smear and cx and viral culture Started on steroids  for warm autoantibody and agree with this.   Thank you very much for the consult. Will follow with you.  Leonel Ramsay   11/30/2017, 2:44 PM

## 2017-11-30 NOTE — Procedures (Signed)
  [facility]            Bronchoscopy Note   FINDINGS/SUMMARY:   -Findings of chronic bronchitis throughout both lungs. -All segments visualized, no endobronchial lesions were noted. - Transbronchial left lower lobe forceps biopsies taken, transbronchial left lower lobe brushings taken, left lower lobe bronchoalveolar lavage also performed.  Indication: Bilateral infiltrates. The patient (or their representative) was informed of the risks (including but not limited to bleeding, infection, respiratory failure, lung injury, tooth/oral injury) and benefits of the procedure and gave consent, see chart.   Pre-op diagnosis: Bilateral infiltrates, persistent fever, history of lung cancer. Post-op diagnosis: Same. Estimated blood loss: Minimal.  Medications for procedure: Versed, fentanyl.  I was present for the duration of conscious sedation, and supervised conscious sedation, total time 30 minutes.  Procedure description: After obtaining informed consent, timeout was called to confirm the patient the procedure.  Right and left nares were checked for patency, the right naris was more patent this was anesthetized with topical lidocaine.  Subsequently the patient was given Versed and fentanyl for conscious sedation, the patient's oxygen saturations remained above 90% for the duration of the procedure.  The right nares was entered, the bronchoscope was moved to the posterior pharynx, normal movement of the vocal cords was appreciated.  The bronchoscope was then taken to the trachea, topical lidocaine was applied to the right and left mainstem bronchi.  An anatomical tumor was then undertaken, all segments were visualized and entered, no endobronchial lesions were noted. The scope was then taken to the left lower lobe, fluoroscopically guided transbronchial biopsies were taken of the left lower lobe with several passes, predominantly to the posterior segments.  This was followed by  fluoroscopically guided left lower lobe transbronchial brushings in the left lower lobe.  Subsequently bronchoalveolar lavage performed on the left lower lobe, sent for cytology and microbiology, including AFB, viral culture.  At this point the patient had developed a lot of severe coughing, as adequate samples had been obtained at that point, the procedure was terminated, the bronchoscope was removed.    Condition post procedure: Stable   Complications: None noted, postoperative chest x-ray shows no evidence of pneumothorax    Deep Ashby Dawes, MD.  Board Certified in Internal Medicine, Pulmonary Medicine, [place], and Sleep Medicine.  [facility] Office Number: [phone]  Patricia Pesa, M.D.  Vilinda Boehringer, M.D.  Cheral Marker, M.D  11/30/2017

## 2017-11-30 NOTE — Progress Notes (Signed)
Bronch today tol well  vss post. 02 2l [place]  1 unit of blood up.hgb 6.9

## 2017-11-30 NOTE — Plan of Care (Signed)
  Progressing Education: Knowledge of General Education information will improve 11/30/2017 1121 - Progressing by Rowe Robert, RN Health Behavior/Discharge Planning: Ability to manage health-related needs will improve 11/30/2017 1121 - Progressing by Rowe Robert, RN Clinical Measurements: Ability to maintain clinical measurements within normal limits will improve 11/30/2017 1121 - Progressing by Rowe Robert, RN Will remain free from infection 11/30/2017 1121 - Progressing by Rowe Robert, RN Diagnostic test results will improve 11/30/2017 1121 - Progressing by Rowe Robert, RN Respiratory complications will improve 11/30/2017 1121 - Progressing by Rowe Robert, RN Cardiovascular complication will be avoided 11/30/2017 1121 - Progressing by Rowe Robert, RN Activity: Risk for activity intolerance will decrease 11/30/2017 1121 - Progressing by Rowe Robert, RN Nutrition: Adequate nutrition will be maintained 11/30/2017 1121 - Progressing by Rowe Robert, RN Coping: Level of anxiety will decrease 11/30/2017 1121 - Progressing by Rowe Robert, RN Elimination: Will not experience complications related to bowel motility 11/30/2017 1121 - Progressing by Rowe Robert, RN Will not experience complications related to urinary retention 11/30/2017 1121 - Progressing by Rowe Robert, RN Pain Managment: General experience of comfort will improve 11/30/2017 1121 - Progressing by Rowe Robert, RN Safety: Ability to remain free from injury will improve 11/30/2017 1121 - Progressing by Rowe Robert, RN Skin Integrity: Risk for impaired skin integrity will decrease 11/30/2017 1121 - Progressing by Rowe Robert, RN Activity: Ability to tolerate increased activity will improve 11/30/2017 1121 - Progressing by Rowe Robert, RN Clinical Measurements: Ability to maintain a body temperature in the normal range will improve 11/30/2017 1121 - Progressing by Rowe Robert,  RN Respiratory: Ability to maintain adequate ventilation will improve 11/30/2017 1121 - Progressing by Rowe Robert, RN

## 2017-11-30 NOTE — Progress Notes (Signed)
PT Cancellation Note  Patient Details Name: Melanie Holloway MRN: [id number] DOB: [birth year]   Cancelled Treatment:    Reason Eval/Treat Not Completed: Medical issues which prohibited therapy   Chart reviewed.  HgB 6.9.  Will hold session and continue as appropriate.    Chesley Noon 11/30/2017, 1:44 PM

## 2017-11-30 NOTE — Progress Notes (Signed)
* [facility]   Assessment and Plan:  Bilateral pulmonary infiltrates with hemoptysis and persistent fevers in the setting of breast cancer, elevated ANA with lymphadenopathy.  --Will plan for bronchoscopy to r/o infection.  Anemia, thrombocytopenia.  --Platelet transfused with increase to 108, will plan for transbronchial biopsies.   Acute respiratory failure, systolic heart failure, pericardial effusion.  --Continue current therapy.      Date: 11/30/2017  MRN# [id number] Melanie Holloway 05/08/1966   Melanie Holloway is a 52 y.o. old female seen in follow up for chief complaint of  Chief Complaint  Patient presents with  . Shortness of Breath     HPI:   No new complaints today, continues to have mild dyspnea. Continued fever this am.   Medication:    Current Facility-Administered Medications:  .  0.9 %  sodium chloride infusion, 250 mL, Intravenous, PRN, Salary, Montell D, MD .  0.9 %  sodium chloride infusion, , Intravenous, Once, Laverle Hobby, MD .  acetaminophen (TYLENOL) tablet 650 mg, 650 mg, Oral, Q4H PRN, Hillary Bow, MD, 650 mg at 11/30/17 0450 .  albuterol (PROVENTIL) (2.5 MG/3ML) 0.083% nebulizer solution 2.5 mg, 2.5 mg, Inhalation, Q4H PRN, Sudini, Srikar, MD .  ALPRAZolam Duanne Moron) tablet 0.25 mg, 0.25 mg, Oral, BID PRN, Salary, Montell D, MD, 0.25 mg at 11/29/17 1526 .  azithromycin (ZITHROMAX) tablet 500 mg, 500 mg, Oral, Daily, Sudini, Srikar, MD, 500 mg at 11/29/17 0948 .  benzonatate (TESSALON) capsule 100 mg, 100 mg, Oral, TID PRN, Loletha Grayer, MD, 100 mg at 11/29/17 0048 .  budesonide (PULMICORT) nebulizer solution 0.5 mg, 0.5 mg, Nebulization, BID, Leslye Peer, Richard, MD, 0.5 mg at 11/30/17 0811 .  butamben-tetracaine-benzocaine (CETACAINE) spray 1 spray, 1 spray, Topical, Once, Laverle Hobby, MD .  cholecalciferol (VITAMIN D) tablet 2,000 Units, 2,000 Units, Oral, Daily, Salary, Montell D, MD, 2,000 Units at 11/29/17  0948 .  fentaNYL (SUBLIMAZE) 100 MCG/2ML injection, , , ,  .  ferrous sulfate tablet 325 mg, 325 mg, Oral, BID WC, Wieting, Richard, MD, 325 mg at 11/29/17 1834 .  guaiFENesin (ROBITUSSIN) 100 MG/5ML solution 100 mg, 5 mL, Oral, Q6H PRN, Loletha Grayer, MD, 100 mg at 11/29/17 0948 .  ipratropium-albuterol (DUONEB) 0.5-2.5 (3) MG/3ML nebulizer solution 3 mL, 3 mL, Nebulization, Q6H, Sudini, Srikar, MD, 3 mL at 11/30/17 0811 .  letrozole [facility]) tablet 2.5 mg, 2.5 mg, Oral, Daily, Salary, Montell D, MD, 2.5 mg at 11/29/17 0949 .  lidocaine (XYLOCAINE) 2 % jelly 1 application, 1 application, Topical, Once, Laverle Hobby, MD .  methylPREDNISolone sodium succinate (SOLU-MEDROL) 125 mg/2 mL injection 60 mg, 60 mg, Intravenous, Q24H, Sudini, Srikar, MD, 60 mg at 11/29/17 1545 .  metoprolol tartrate (LOPRESSOR) tablet 12.5 mg, 12.5 mg, Oral, BID, Sudini, Srikar, MD, 12.5 mg at 11/29/17 2127 .  midazolam (VERSED) 5 MG/5ML injection, , , ,  .  multivitamin with minerals tablet 1 tablet, 1 tablet, Oral, Daily, Gouru, Aruna, MD, 1 tablet at 11/29/17 0948 .  ondansetron (ZOFRAN) injection 4 mg, 4 mg, Intravenous, Q6H PRN, Salary, Montell D, MD, 4 mg at 11/25/17 1812 .  phenylephrine (NEO-SYNEPHRINE) 0.25 % nasal spray 1 spray, 1 spray, Each Nare, Q6H PRN, Ashby Dawes, Jafari Mckillop, MD .  piperacillin-tazobactam (ZOSYN) IVPB 3.375 g, 3.375 g, Intravenous, Q8H, Lenis Noon, RPH, Last Rate: 12.5 mL/hr at 11/30/17 0930, 3.375 g at 11/30/17 0930 .  protein supplement (PREMIER PROTEIN) liquid, 11 oz, Oral, BID BM, Gouru, Aruna, MD, 11 oz at 11/28/17 1425 .  sodium chloride flush (NS) 0.9 % injection 3 mL, 3 mL, Intravenous, Q12H, Salary, Montell D, MD, 3 mL at 11/30/17 0959 .  sodium chloride flush (NS) 0.9 % injection 3 mL, 3 mL, Intravenous, PRN, Salary, Montell D, MD, 3 mL at 11/25/17 0621 .  triamcinolone cream (KENALOG) 0.1 % 1 application, 1 application, Topical, BID PRN, Salary, Montell D,  MD   Allergies:  Patient has no known allergies.  Review of Systems: Gen:  Denies sweats. HEENT: Denies blurred vision. Cvc:  No dizziness, chest pain or heaviness Resp:   Denies cough Gi: Denies swallowing difficulty, stomach pain. constipation, bowel incontinence Gu:  Denies bladder incontinence, burning urine Ext:   No Joint pain, stiffness. Skin: No skin rash, easy bruising. Endoc:  No polyuria, polydipsia. Psych: No depression, insomnia. Other:  All other systems were reviewed and found to be negative other than what is mentioned in the HPI.   Physical Examination:   VS: BP 112/78   Pulse 80   Temp 98.1 F (36.7 C) (Oral)   Resp (!) 25   Ht 4\' 11"  (1.499 m)   Wt 138 lb 7.2 oz (62.8 kg)   SpO2 100%   BMI 27.96 kg/m    General Appearance: No distress  Neuro:without focal findings,  speech normal,  HEENT: PERRLA, EOM intact.  Pulmonary: normal breath sounds, scattered rhonchi.  CardiovascularNormal S1,S2.  No m/r/g.   Abdomen: Benign, Soft, non-tender. Renal:  No costovertebral tenderness  GU:  Not performed at this time. Endoc: No evident thyromegaly, no signs of acromegaly. Skin:   warm, no rash. Extremities: normal, no cyanosis, clubbing.   LABORATORY PANEL:   CBC Recent Labs  Lab 11/30/17 1231  WBC 2.0*  HGB 6.9*  HCT 20.8*  PLT 108*   ------------------------------------------------------------------------------------------------------------------  Chemistries  Recent Labs  Lab 11/30/17 1231  NA 129*  K 3.4*  CL 101  CO2 18*  GLUCOSE 136*  BUN 56*  CREATININE 2.20*  CALCIUM 8.2*   ------------------------------------------------------------------------------------------------------------------  Cardiac Enzymes No results for input(s): TROPONINI in the last 168 hours. ------------------------------------------------------------  RADIOLOGY:   No results found for this or any previous visit. Results for orders placed during the  hospital encounter of 11/18/17  DG Chest 2 View   Narrative CLINICAL DATA:  Shortness of breath. The patient underwent neck biopsy yesterday.  EXAM: CHEST  2 VIEW  COMPARISON:  Chest x-ray of November 19, 2017  FINDINGS: The lungs are well-expanded. The cardiac silhouette is enlarged. The pulmonary vascularity is not engorged. There calcification in the wall of the aortic arch. There surgical clips in the left axillary region. There is no pneumothorax or pneumomediastinum nor pleural effusion. Knee and the  IMPRESSION: Left lower lobe atelectasis or early pneumonia. Stable cardiomegaly without pulmonary edema. Followup PA and lateral chest X-ray is recommended in 3-4 weeks following trial of antibiotic therapy to ensure resolution and exclude underlying malignancy.  Thoracic aortic atherosclerosis.   Electronically Signed   By: David  Martinique M.D.   On: 11/23/2017 09:59    ------------------------------------------------------------------------------------------------------------------  Thank  you for allowing [facility] to assist in the care of your patient. Our recommendations are noted above.  Please contact us if we can be of further service.   Marda Stalker, MD.  [facility] Office Number: [phone]  Patricia Pesa, M.D.  Merton Border, M.D  11/30/2017

## 2017-11-30 NOTE — Progress Notes (Signed)
PT Cancellation Note  Patient Details Name: Melanie Holloway MRN: [id number] DOB: February 07, 1966   Cancelled Treatment:    Reason Eval/Treat Not Completed: Other (comment)   Pt offered and encouraged session.  Pt stated she is awaiting procedure - bronchoscopy today.  Nursing unsure at this time of time.  Pt declined session stating she would rather wait until tomorrow.   Chesley Noon 11/30/2017, 9:38 AM

## 2017-12-01 ENCOUNTER — Encounter: Payer: Self-pay | Admitting: Internal Medicine

## 2017-12-01 ENCOUNTER — Inpatient Hospital Stay: Payer: 59

## 2017-12-01 LAB — CBC WITH DIFFERENTIAL/PLATELET
BASOS ABS: 0 10*3/uL (ref 0–0.1)
Basophils Relative: 0 %
Eosinophils Absolute: 0 10*3/uL (ref 0–0.7)
Eosinophils Relative: 0 %
HCT: 26.3 % — ABNORMAL LOW (ref 35.0–47.0)
HEMOGLOBIN: 9.1 g/dL — AB (ref 12.0–16.0)
LYMPHS PCT: 11 %
Lymphs Abs: 0.4 10*3/uL — ABNORMAL LOW (ref 1.0–3.6)
MCH: 30 pg (ref 26.0–34.0)
MCHC: 34.7 g/dL (ref 32.0–36.0)
MCV: 86.5 fL (ref 80.0–100.0)
MONO ABS: 0.1 10*3/uL — AB (ref 0.2–0.9)
MONOS PCT: 3 %
NEUTROS ABS: 2.9 10*3/uL (ref 1.4–6.5)
NEUTROS PCT: 86 %
Platelets: 117 10*3/uL — ABNORMAL LOW (ref 150–440)
RBC: 3.05 MIL/uL — ABNORMAL LOW (ref 3.80–5.20)
RDW: 15.3 % — AB (ref 11.5–14.5)
WBC: 3.4 10*3/uL — ABNORMAL LOW (ref 3.6–11.0)

## 2017-12-01 LAB — BASIC METABOLIC PANEL
ANION GAP: 11 (ref 5–15)
BUN: 62 mg/dL — ABNORMAL HIGH (ref 6–20)
CALCIUM: 8.4 mg/dL — AB (ref 8.9–10.3)
CHLORIDE: 100 mmol/L — AB (ref 101–111)
CO2: 17 mmol/L — ABNORMAL LOW (ref 22–32)
Creatinine, Ser: 2.35 mg/dL — ABNORMAL HIGH (ref 0.44–1.00)
GFR calc Af Amer: 26 mL/min — ABNORMAL LOW (ref >60)
GFR calc non Af Amer: 22 mL/min — ABNORMAL LOW (ref >60)
GLUCOSE: 165 mg/dL — AB (ref 65–99)
Potassium: 3.8 mmol/L (ref 3.5–5.1)
Sodium: 128 mmol/L — ABNORMAL LOW (ref 135–145)

## 2017-12-01 LAB — TYPE AND SCREEN
ABO/RH(D): B POS
Antibody Screen: NEGATIVE
Unit division: 0

## 2017-12-01 LAB — BPAM PLATELET PHERESIS
BLOOD PRODUCT EXPIRATION DATE: 201902132359
ISSUE DATE / TIME: 201902120255
UNIT TYPE AND RH: 6200

## 2017-12-01 LAB — ANCA TITERS: Atypical P-ANCA titer: <1:20 {titer}

## 2017-12-01 LAB — BPAM RBC
Blood Product Expiration Date: 201902192359
ISSUE DATE / TIME: 201902121823
Unit Type and Rh: 9500

## 2017-12-01 LAB — BETA-2-GLYCOPROTEIN I ABS, IGG/M/A
Beta-2 Glyco I IgG: 30 GPI IgG units — ABNORMAL HIGH (ref 0–20)
Beta-2-Glycoprotein I IgA: <9 GPI IgA units (ref 0–25)
Beta-2-Glycoprotein I IgM: 17 GPI IgM units (ref 0–32)

## 2017-12-01 LAB — PREPARE PLATELET PHERESIS: Unit division: 0

## 2017-12-01 LAB — CYTOLOGY - NON PAP

## 2017-12-01 LAB — HAPTOGLOBIN: Haptoglobin: 188 mg/dL (ref 34–200)

## 2017-12-01 LAB — MYCOPLASMA PNEUMONIAE ANTIBODY, IGM

## 2017-12-01 MED ORDER — SODIUM CHLORIDE 0.9 % IV SOLN
INTRAVENOUS | Status: DC
  Administered 2017-12-01: 18:00:00 via INTRAVENOUS

## 2017-12-01 MED ORDER — ALUM & MAG HYDROXIDE-SIMETH 200-200-20 MG/5ML PO SUSP
30.0000 mL | ORAL | Status: DC | PRN
  Administered 2017-12-01: 30 mL via ORAL

## 2017-12-01 MED ORDER — ALUM & MAG HYDROXIDE-SIMETH 200-200-20 MG/5ML PO SUSP
15.0000 mL | ORAL | Status: DC | PRN
  Administered 2017-12-01: 09:00:00 15 mL via ORAL
  Filled 2017-12-01 (×3): qty 30

## 2017-12-01 NOTE — Progress Notes (Signed)
Patient ID: Melanie Holloway, female   DOB: 1965-12-03, 52 y.o.   MRN: [id number]  [facility] PROGRESS NOTE  Melanie Holloway GGE:[id number] DOB: Feb 17, 1966 DOA: 11/18/2017 PCP: Ria Bush, MD  HPI/Subjective:  Feels some improvement today.  Walked in the hallway.  Still on oxygen.  Febrile in the morning.  Objective: Vitals:   12/01/17 0413 12/01/17 1328  BP: 118/87 125/80  Pulse: (!) 117 90  Resp:  (!) 24  Temp: (!) 102.2 F (39 C) 98.4 F (36.9 C)  SpO2: 98% 100%    Filed Weights   11/29/17 0947 11/30/17 0500 12/01/17 0413  Weight: 66.7 kg (147 lb) 62.8 kg (138 lb 7.2 oz) 68.1 kg (150 lb 2.1 oz)    ROS: Review of Systems  Constitutional: Positive for malaise/fatigue. Negative for chills and fever.  HENT: Negative for congestion and sore throat.   Eyes: Negative for blurred vision.  Respiratory: Positive for cough, shortness of breath and wheezing.   Cardiovascular: Positive for orthopnea. Negative for chest pain.  Gastrointestinal: Positive for diarrhea. Negative for abdominal pain, constipation, nausea and vomiting.  Genitourinary: Negative for dysuria.  Musculoskeletal: Negative for joint pain.  Neurological: Negative for dizziness and headaches.   Exam: Physical Exam  HENT:  Nose: No mucosal edema.  Mouth/Throat: No oropharyngeal exudate or posterior oropharyngeal edema.  Eyes: Conjunctivae, EOM and lids are normal. Pupils are equal, round, and reactive to light.  Neck: No JVD present. Carotid bruit is not present. No edema present. No thyroid mass and no thyromegaly present.  Cardiovascular: S1 normal and S2 normal. Exam reveals no gallop.  No murmur heard. Pulses:      Dorsalis pedis pulses are 2+ on the right side, and 2+ on the left side.  Respiratory: No respiratory distress. She has decreased breath sounds in the right lower field and the left lower field. She has wheezes in the right lower field and the left lower field. She has no rhonchi. She has no  rales.  GI: Soft. Bowel sounds are normal. There is no tenderness.  Musculoskeletal:       Right ankle: She exhibits swelling.       Left ankle: She exhibits swelling.  Lymphadenopathy:    She has no cervical adenopathy.  Neurological: She is alert. No cranial nerve deficit.  Skin: Skin is warm. No rash noted. Nails show no clubbing.  Psychiatric: She has a normal mood and affect.   Data Reviewed: Basic Metabolic Panel: Recent Labs  Lab 11/26/17 0805 11/28/17 0819 11/29/17 0306 11/30/17 1231 12/01/17 0301  NA 131* 128* 129* 129* 128*  K 4.0 3.9 3.9 3.4* 3.8  CL 103 101 100* 101 100*  CO2 20* 19* 19* 18* 17*  GLUCOSE 91 93 107* 136* 165*  BUN 39* 46* 45* 56* 62*  CREATININE 1.55* 1.79* 1.70* 2.20* 2.35*  CALCIUM 8.3* 8.0* 8.3* 8.2* 8.4*   CBC: Recent Labs  Lab 11/26/17 0805 11/28/17 0819 11/29/17 0306 11/30/17 1231 12/01/17 0301  WBC 2.5* 3.0* 3.8 2.0* 3.4*  NEUTROABS 1.3* 2.0 2.6 1.5 2.9  HGB 8.2* 7.2* 7.7* 6.9* 9.1*  HCT 24.3* 21.4* 23.0* 20.8* 26.3*  MCV 86.6 86.5 87.6 86.2 86.5  PLT 67* 68* 76* 108* 117*   Cardiac Enzymes: No results for input(s): CKTOTAL, CKMB, CKMBINDEX, TROPONINI in the last 168 hours. BNP (last 3 results) Recent Labs    11/18/17 1715 11/20/17 0405  BNP 2,975.0* 852.0*     CBG: No results for input(s): GLUCAP in the last 168 hours.  Recent Results (from the past 240 hour(s))  MRSA PCR Screening     Status: None   Collection Time: 11/23/17  5:41 PM  Result Value Ref Range Status   MRSA by PCR NEGATIVE NEGATIVE Final    Comment:        The GeneXpert MRSA Assay (FDA approved for NASAL specimens only), is one component of a comprehensive MRSA colonization surveillance program. It is not intended to diagnose MRSA infection nor to guide or monitor treatment for MRSA infections. Performed at [facility], [address] [place], [place] [address]   Gastrointestinal Panel by PCR , Stool     Status: None   Collection  Time: 11/26/17  8:12 AM  Result Value Ref Range Status   Campylobacter species NOT DETECTED NOT DETECTED Final   Plesimonas shigelloides NOT DETECTED NOT DETECTED Final   Salmonella species NOT DETECTED NOT DETECTED Final   Yersinia enterocolitica NOT DETECTED NOT DETECTED Final   Vibrio species NOT DETECTED NOT DETECTED Final   Vibrio cholerae NOT DETECTED NOT DETECTED Final   Enteroaggregative E coli (EAEC) NOT DETECTED NOT DETECTED Final   Enteropathogenic E coli (EPEC) NOT DETECTED NOT DETECTED Final   Enterotoxigenic E coli (ETEC) NOT DETECTED NOT DETECTED Final   Shiga like toxin producing E coli (STEC) NOT DETECTED NOT DETECTED Final   Shigella/Enteroinvasive E coli (EIEC) NOT DETECTED NOT DETECTED Final   Cryptosporidium NOT DETECTED NOT DETECTED Final   Cyclospora cayetanensis NOT DETECTED NOT DETECTED Final   Entamoeba histolytica NOT DETECTED NOT DETECTED Final   Giardia lamblia NOT DETECTED NOT DETECTED Final   Adenovirus F40/41 NOT DETECTED NOT DETECTED Final   Astrovirus NOT DETECTED NOT DETECTED Final   Norovirus GI/GII NOT DETECTED NOT DETECTED Final   Rotavirus A NOT DETECTED NOT DETECTED Final   Sapovirus (I, II, IV, and V) NOT DETECTED NOT DETECTED Final    Comment: Performed at [facility], [address] [place], [place] [address]  C difficile quick scan w PCR reflex     Status: None   Collection Time: 11/26/17  8:12 AM  Result Value Ref Range Status   C Diff antigen NEGATIVE NEGATIVE Final   C Diff toxin NEGATIVE NEGATIVE Final   C Diff interpretation No C. difficile detected.  Final    Comment: Performed at [facility], [address] [place], [place] [address]  CULTURE, BLOOD (ROUTINE X 2) w Reflex to ID Panel     Status: None (Preliminary result)   Collection Time: 11/27/17  8:58 AM  Result Value Ref Range Status   Specimen Description BLOOD RIGHT ANTECUBITAL  Final   Special Requests   Final    BOTTLES DRAWN AEROBIC AND ANAEROBIC  Blood Culture adequate volume   Culture   Final    NO GROWTH 4 DAYS Performed at [facility], [address] [place], [place] [address]    Report Status PENDING  Incomplete  CULTURE, BLOOD (ROUTINE X 2) w Reflex to ID Panel     Status: None (Preliminary result)   Collection Time: 11/27/17 11:03 AM  Result Value Ref Range Status   Specimen Description BLOOD RIGHT ANTECUBITAL  Final   Special Requests   Final    BOTTLES DRAWN AEROBIC AND ANAEROBIC Blood Culture adequate volume   Culture   Final    NO GROWTH 4 DAYS Performed at [facility], [address], [place], [place] [address]    Report Status PENDING  Incomplete  Respiratory  Panel by PCR     Status: None   Collection Time: 11/29/17  5:30 PM  Result Value Ref Range Status   Adenovirus NOT DETECTED NOT DETECTED Final   Coronavirus 229E NOT DETECTED NOT DETECTED Final   Coronavirus HKU1 NOT DETECTED NOT DETECTED Final   Coronavirus NL63 NOT DETECTED NOT DETECTED Final   Coronavirus OC43 NOT DETECTED NOT DETECTED Final   Metapneumovirus NOT DETECTED NOT DETECTED Final   Rhinovirus / Enterovirus NOT DETECTED NOT DETECTED Final   Influenza A NOT DETECTED NOT DETECTED Final   Influenza A H1 NOT DETECTED NOT DETECTED Final   Influenza A H1 2009 NOT DETECTED NOT DETECTED Final   Influenza A H3 NOT DETECTED NOT DETECTED Final   Influenza B NOT DETECTED NOT DETECTED Final   Parainfluenza Virus 1 NOT DETECTED NOT DETECTED Final   Parainfluenza Virus 2 NOT DETECTED NOT DETECTED Final   Parainfluenza Virus 3 NOT DETECTED NOT DETECTED Final   Parainfluenza Virus 4 NOT DETECTED NOT DETECTED Final   Respiratory Syncytial Virus NOT DETECTED NOT DETECTED Final   Bordetella pertussis NOT DETECTED NOT DETECTED Final   Chlamydophila pneumoniae NOT DETECTED NOT DETECTED Final   Mycoplasma pneumoniae NOT DETECTED NOT DETECTED Final    Comment: Performed at [facility], [place]. [address] [place], [place] [address]   Culture, expectorated sputum-assessment     Status: None   Collection Time: 11/29/17  9:35 PM  Result Value Ref Range Status   Specimen Description SPUTUM  Final   Special Requests NONE  Final   Sputum evaluation   Final    THIS SPECIMEN IS ACCEPTABLE FOR SPUTUM CULTURE Performed at [facility], [address], [place], [place] [address]    Report Status 11/30/2017 FINAL  Final  Culture, respiratory (NON-Expectorated)     Status: None (Preliminary result)   Collection Time: 11/29/17  9:35 PM  Result Value Ref Range Status   Specimen Description   Final    SPUTUM Performed at [facility], [address], [place], [place] [address]    Special Requests   Final    NONE Reflexed from [id number] Performed at [facility], [address] [place], [place] [address]    Gram Stain   Final    RARE WBC PRESENT, PREDOMINANTLY PMN FEW YEAST RARE GRAM POSITIVE COCCI IN PAIRS    Culture   Final    CULTURE REINCUBATED FOR BETTER GROWTH Performed at [facility], [place] [address], [place], [place] [address]    Report Status PENDING  Incomplete  Culture, bal-quantitative     Status: None (Preliminary result)   Collection Time: 11/30/17  2:50 PM  Result Value Ref Range Status   Specimen Description   Final    Bronch Lavag Performed at [facility], [address] [place], [place] [address]    Special Requests   Final    Immunocompromised Performed at [facility], [address] [place], [place] [address]    Gram Stain   Final    RARE WBC PRESENT, PREDOMINANTLY PMN NO ORGANISMS SEEN    Culture   Final    NO GROWTH < 24 HOURS Performed at [facility], [place] [address], [place], [place] [address]    Report Status PENDING  Incomplete      Scheduled Meds: . azithromycin  500 mg Oral Daily  . budesonide (PULMICORT) nebulizer solution  0.5 mg Nebulization BID  . butamben-tetracaine-benzocaine  1 spray  Topical Once  .  cholecalciferol  2,000 Units Oral Daily  . ferrous sulfate  325 mg Oral BID WC  . ipratropium-albuterol  3 mL Nebulization Q6H  . letrozole  2.5 mg Oral Daily  . lidocaine  1 application Topical Once  . methylPREDNISolone (SOLU-MEDROL) injection  60 mg Intravenous Q24H  . metoprolol tartrate  12.5 mg Oral BID  . multivitamin with minerals  1 tablet Oral Daily  . protein supplement shake  11 oz Oral BID BM  . sodium chloride flush  3 mL Intravenous Q12H   Continuous Infusions: . sodium chloride    . sodium chloride    . piperacillin-tazobactam (ZOSYN)  IV Stopped (12/01/17 0915)    Assessment/Plan:    * AKI This with nephrology Dr. Holley Raring.  History of lupus. She did receive contrast during early stay.  Also was on Lasix.  Repeat labs in the morning.  Input and output.  Renal ultrasound.  * HCAP with on going fever- SOB better today Associated with Acute hypoxic resp failure Continues to have fevers.      Discussed with Dr. Ola Spurr.   On IV Zosyn and azithromycin. Bronchoscopy 11/30/2017 Repeat CT scan of the chest continues to show bilateral infiltrates.  * Acute systolic congestive heart failure with EF 45-50%.  Moderate to large pericardial effusion.  Improved.  * Acute kidney injury on chronic kidney disease stage III.   * Metastatic breast cancer. LN biopsy negative for breast cancer.  .  Patient also has a pericardial effusion and lymphadenopathy seen on the chest. Malignant?  * Essential hypertension.  Blood pressure on the lower side.  Holding all medications except for lower dose Coreg at this point  * Weakness.  Physical therapy recommended home health.  * Pancytopenia with cancer history- Appreciate oncology input.   Code Status:     Code Status Orders  (From admission, onward)        Start     Ordered   11/18/17 2216  Full code  Continuous     11/18/17 2217    Code Status History    Date Active Date Inactive Code Status Order ID Comments User  Context   09/22/2017 03:26 09/22/2017 19:40 Full Code [id number]  Lance Coon, MD Inpatient      Time spent: 35 minutes  [facility]

## 2017-12-01 NOTE — Progress Notes (Addendum)
Melanie Holloway INFECTIOUS DISEASE PROGRESS NOTE Date of Admission:  11/18/2017     ID: Melanie Holloway is a 52 y.o. female with fevers, LAN  Active Problems:   Respiratory failure (HCC)  Subjective: Temp 102.2 yest.  Tolerated bronch. Studies pending.  Feels a little better, actually walked around the hallway. No skin lesions, no oral sores. No joint pain. Getting renal USS  ROS  Eleven systems are reviewed and negative except per hpi  Medications:  Antibiotics Given (last 72 hours)    Date/Time Action Medication Dose Rate   11/28/17 1654 New Bag/Given   piperacillin-tazobactam (ZOSYN) IVPB 3.375 g 3.375 g 12.5 mL/hr   11/28/17 1654 Given   azithromycin (ZITHROMAX) tablet 500 mg 500 mg    11/28/17 2053 New Bag/Given   piperacillin-tazobactam (ZOSYN) IVPB 3.375 g 3.375 g 12.5 mL/hr   11/29/17 0441 New Bag/Given   piperacillin-tazobactam (ZOSYN) IVPB 3.375 g 3.375 g 12.5 mL/hr   11/29/17 0948 Given   azithromycin (ZITHROMAX) tablet 500 mg 500 mg    11/29/17 1515 New Bag/Given   piperacillin-tazobactam (ZOSYN) IVPB 3.375 g 3.375 g 12.5 mL/hr   11/29/17 2238 New Bag/Given   piperacillin-tazobactam (ZOSYN) IVPB 3.375 g 3.375 g 12.5 mL/hr   11/30/17 0930 New Bag/Given   piperacillin-tazobactam (ZOSYN) IVPB 3.375 g 3.375 g 12.5 mL/hr   11/30/17 1749 Given   azithromycin (ZITHROMAX) tablet 500 mg 500 mg    11/30/17 2228 New Bag/Given   piperacillin-tazobactam (ZOSYN) IVPB 3.375 g 3.375 g 12.5 mL/hr   12/01/17 0532 New Bag/Given   piperacillin-tazobactam (ZOSYN) IVPB 3.375 g 3.375 g 12.5 mL/hr   12/01/17 3128 Given   azithromycin (ZITHROMAX) tablet 500 mg 500 mg      . azithromycin  500 mg Oral Daily  . budesonide (PULMICORT) nebulizer solution  0.5 mg Nebulization BID  . butamben-tetracaine-benzocaine  1 spray Topical Once  . cholecalciferol  2,000 Units Oral Daily  . ferrous sulfate  325 mg Oral BID WC  . ipratropium-albuterol  3 mL Nebulization Q6H  . letrozole  2.5 mg Oral  Daily  . lidocaine  1 application Topical Once  . methylPREDNISolone (SOLU-MEDROL) injection  60 mg Intravenous Q24H  . metoprolol tartrate  12.5 mg Oral BID  . multivitamin with minerals  1 tablet Oral Daily  . protein supplement shake  11 oz Oral BID BM  . sodium chloride flush  3 mL Intravenous Q12H    Objective: Vital signs in last 24 hours: Temp:  [97.3 F (36.3 C)-102.2 F (39 C)] 98.4 F (36.9 C) (02/13 1328) Pulse Rate:  [80-117] 90 (02/13 1328) Resp:  [18-37] 24 (02/13 1328) BP: (112-127)/(74-87) 125/80 (02/13 1328) SpO2:  [98 %-100 %] 100 % (02/13 1328) Weight:  [68.1 kg (150 lb 2.1 oz)] 68.1 kg (150 lb 2.1 oz) (02/13 0413) Constitutional:  oriented to person, place, and time. Mildly ill appearing, on O2 via [place] HENT: [place]/AT, PERRLA, no scleral icterus Mouth/Throat: Oropharynx is clear and dry . No oropharyngeal exudate.  Cardiovascular: Tachy, gallop Pulmonary/Chest: poor air movement, rhonchi throughout Neck = supple, no nuchal rigidity Abdominal: Soft. Bowel sounds are normal.  exhibits no distension. There is no tenderness.  Lymphadenopathy: no cervical adenopathy. No axillary adenopathy Neurological: alert and oriented to person, place, and time.  Skin: Skin is warm and dry. No rash noted. No erythema.  Ext 1+ edema bil LE Psychiatric: a normal mood and affect.  behavior is normal.    Lab Results Recent Labs    11/30/17 1231 12/01/17  0301  WBC 2.0* 3.4*  HGB 6.9* 9.1*  HCT 20.8* 26.3*  NA 129* 128*  K 3.4* 3.8  CL 101 100*  CO2 18* 17*  BUN 56* 62*  CREATININE 2.20* 2.35*    Microbiology: Results for orders placed or performed during the hospital encounter of 11/18/17  MRSA PCR Screening     Status: None   Collection Time: 11/18/17  9:50 PM  Result Value Ref Range Status   MRSA by PCR NEGATIVE NEGATIVE Final    Comment:        The GeneXpert MRSA Assay (FDA approved for NASAL specimens only), is one component of a comprehensive MRSA  colonization surveillance program. It is not intended to diagnose MRSA infection nor to guide or monitor treatment for MRSA infections. Performed at [facility], [address] [place], [place] [address]   MRSA PCR Screening     Status: None   Collection Time: 11/23/17  5:41 PM  Result Value Ref Range Status   MRSA by PCR NEGATIVE NEGATIVE Final    Comment:        The GeneXpert MRSA Assay (FDA approved for NASAL specimens only), is one component of a comprehensive MRSA colonization surveillance program. It is not intended to diagnose MRSA infection nor to guide or monitor treatment for MRSA infections. Performed at [facility], [address] [place], [place] [address]   Gastrointestinal Panel by PCR , Stool     Status: None   Collection Time: 11/26/17  8:12 AM  Result Value Ref Range Status   Campylobacter species NOT DETECTED NOT DETECTED Final   Plesimonas shigelloides NOT DETECTED NOT DETECTED Final   Salmonella species NOT DETECTED NOT DETECTED Final   Yersinia enterocolitica NOT DETECTED NOT DETECTED Final   Vibrio species NOT DETECTED NOT DETECTED Final   Vibrio cholerae NOT DETECTED NOT DETECTED Final   Enteroaggregative E coli (EAEC) NOT DETECTED NOT DETECTED Final   Enteropathogenic E coli (EPEC) NOT DETECTED NOT DETECTED Final   Enterotoxigenic E coli (ETEC) NOT DETECTED NOT DETECTED Final   Shiga like toxin producing E coli (STEC) NOT DETECTED NOT DETECTED Final   Shigella/Enteroinvasive E coli (EIEC) NOT DETECTED NOT DETECTED Final   Cryptosporidium NOT DETECTED NOT DETECTED Final   Cyclospora cayetanensis NOT DETECTED NOT DETECTED Final   Entamoeba histolytica NOT DETECTED NOT DETECTED Final   Giardia lamblia NOT DETECTED NOT DETECTED Final   Adenovirus F40/41 NOT DETECTED NOT DETECTED Final   Astrovirus NOT DETECTED NOT DETECTED Final   Norovirus GI/GII NOT DETECTED NOT DETECTED Final   Rotavirus A NOT DETECTED NOT DETECTED Final    Sapovirus (I, II, IV, and V) NOT DETECTED NOT DETECTED Final    Comment: Performed at [facility], [address] [place], [place] [address]  C difficile quick scan w PCR reflex     Status: None   Collection Time: 11/26/17  8:12 AM  Result Value Ref Range Status   C Diff antigen NEGATIVE NEGATIVE Final   C Diff toxin NEGATIVE NEGATIVE Final   C Diff interpretation No C. difficile detected.  Final    Comment: Performed at [facility], [address] [place], [place] [address]  CULTURE, BLOOD (ROUTINE X 2) w Reflex to ID Panel     Status: None (Preliminary result)   Collection Time: 11/27/17  8:58 AM  Result Value Ref Range Status   Specimen Description BLOOD RIGHT ANTECUBITAL  Final   Special Requests   Final  BOTTLES DRAWN AEROBIC AND ANAEROBIC Blood Culture adequate volume   Culture   Final    NO GROWTH 4 DAYS Performed at [facility], [address] [place], [place] [address]    Report Status PENDING  Incomplete  CULTURE, BLOOD (ROUTINE X 2) w Reflex to ID Panel     Status: None (Preliminary result)   Collection Time: 11/27/17 11:03 AM  Result Value Ref Range Status   Specimen Description BLOOD RIGHT ANTECUBITAL  Final   Special Requests   Final    BOTTLES DRAWN AEROBIC AND ANAEROBIC Blood Culture adequate volume   Culture   Final    NO GROWTH 4 DAYS Performed at [facility], [address] [place], [place] [address]    Report Status PENDING  Incomplete  Respiratory Panel by PCR     Status: None   Collection Time: 11/29/17  5:30 PM  Result Value Ref Range Status   Adenovirus NOT DETECTED NOT DETECTED Final   Coronavirus 229E NOT DETECTED NOT DETECTED Final   Coronavirus HKU1 NOT DETECTED NOT DETECTED Final   Coronavirus NL63 NOT DETECTED NOT DETECTED Final   Coronavirus OC43 NOT DETECTED NOT DETECTED Final   Metapneumovirus NOT DETECTED NOT DETECTED Final   Rhinovirus / Enterovirus NOT DETECTED NOT DETECTED Final   Influenza  A NOT DETECTED NOT DETECTED Final   Influenza A H1 NOT DETECTED NOT DETECTED Final   Influenza A H1 2009 NOT DETECTED NOT DETECTED Final   Influenza A H3 NOT DETECTED NOT DETECTED Final   Influenza B NOT DETECTED NOT DETECTED Final   Parainfluenza Virus 1 NOT DETECTED NOT DETECTED Final   Parainfluenza Virus 2 NOT DETECTED NOT DETECTED Final   Parainfluenza Virus 3 NOT DETECTED NOT DETECTED Final   Parainfluenza Virus 4 NOT DETECTED NOT DETECTED Final   Respiratory Syncytial Virus NOT DETECTED NOT DETECTED Final   Bordetella pertussis NOT DETECTED NOT DETECTED Final   Chlamydophila pneumoniae NOT DETECTED NOT DETECTED Final   Mycoplasma pneumoniae NOT DETECTED NOT DETECTED Final    Comment: Performed at [facility], [place] [address], [place], [place] [address]  Culture, expectorated sputum-assessment     Status: None   Collection Time: 11/29/17  9:35 PM  Result Value Ref Range Status   Specimen Description SPUTUM  Final   Special Requests NONE  Final   Sputum evaluation   Final    THIS SPECIMEN IS ACCEPTABLE FOR SPUTUM CULTURE Performed at [facility], [address] [place], [place] [address]    Report Status 11/30/2017 FINAL  Final  Culture, respiratory (NON-Expectorated)     Status: None (Preliminary result)   Collection Time: 11/29/17  9:35 PM  Result Value Ref Range Status   Specimen Description   Final    SPUTUM Performed at [facility], [address] [place], [place] [address]    Special Requests   Final    NONE Reflexed from [id number] Performed at [facility], [address] [place], [place] [address]    Gram Stain   Final    RARE WBC PRESENT, PREDOMINANTLY PMN FEW YEAST RARE GRAM POSITIVE COCCI IN PAIRS    Culture   Final    CULTURE REINCUBATED FOR BETTER GROWTH Performed at [facility], [place] [address], [place], [place] [address]    Report Status PENDING  Incomplete  Culture, bal-quantitative     Status: None  (Preliminary result)   Collection Time: 11/30/17  2:50 PM  Result  Value Ref Range Status   Specimen Description   Final    Bronch Lavag Performed at [facility], [address] [place], [place] [address]    Special Requests   Final    Immunocompromised Performed at [facility], [place], [place] [address]    Gram Stain   Final    RARE WBC PRESENT, PREDOMINANTLY PMN NO ORGANISMS SEEN    Culture   Final    NO GROWTH < 24 HOURS Performed at [facility] [address] [place], [place] [address]    Report Status PENDING  Incomplete    Studies/Results: Dg Chest 1 View  Result Date: 11/30/2017 CLINICAL DATA:  Post bronchoscopy chest x-ray. History of breast malignancy, anemia, and abnormal chest CT findings on November 29, 2017. EXAM: CHEST 1 VIEW COMPARISON:  Chest x-ray of November 26, 2017. FINDINGS: The lungs are adequately inflated. There is no pneumothorax. The interstitial markings are increased bilaterally and more conspicuous than on the previous study. The hemidiaphragms remain visible. The cardiac silhouette remains enlarged. The pulmonary vascularity is mildly engorged. The trachea is midline. There surgical clips in the left axillary region. IMPRESSION: No postprocedure pneumothorax. Interstitial changes in the setting of cardiomegaly and pulmonary vascular congestion is consistent with mild CHF. Electronically Signed   By: Mio Schellinger  Martinique M.D.   On: 11/30/2017 14:39   Ct Chest Wo Contrast  Result Date: 11/29/2017 CLINICAL DATA:  52 year old female with hemoptysis and cough. Metastatic breast cancer. EXAM: CT CHEST WITHOUT CONTRAST TECHNIQUE: Multidetector CT imaging of the chest was performed following the standard protocol without IV contrast. COMPARISON:  Chest CTA 11/18/2017 and earlier. FINDINGS: Cardiovascular: Allowing for absent IV contrast cardiomegaly and pericardial effusion appears stable since 11/18/2017. Vascular patency is  not evaluated in the absence of IV contrast. Calcified coronary artery atherosclerosis is evident. Lesser calcified aortic atherosclerosis. Mediastinum/Nodes: Extensive mediastinal and bilateral hilar lymphadenopathy has not significantly changed since 11/18/2017. Continued extensive right axillary lymphadenopathy, stable. Lungs/Pleura: Widespread bilateral centrilobular pulmonary ground-glass nodularity, less pronounced in the lung apices, largely new since 11/04/2017 and more extensive and nodular in appearance compared to 11/18/2017. Continued confluent ground-glass opacity throughout the left lower lobe and right costophrenic angle. Decreased layering right pleural effusion since 11/18/2017. Stable to mildly decreased left sub pulmonic effusion. Areas of subpleural nodularity and thickening in the left lung which may be post radiation related in light of prior left mastectomy. Upper Abdomen: Stable visible noncontrast upper abdominal viscera. Musculoskeletal: Mixed lytic and sclerotic skeletal metastases appear stable since 11/04/2017. IMPRESSION: 1. Continued widespread abnormal predominantly ground-glass pulmonary opacity, similar in distribution although more nodular in appearance since 11/18/2017. Differential considerations are fairly broad and include infectious as well as noninfectious inflammation (including vasculitis), and lymphangitic carcinomatosis (in light of #2). Consider atypical infectious etiology if the patient is immunocompromised. 2. Continued extensive mediastinal and hilar lymphadenopathy. Continued extensive right axillary lymphadenopathy. 3. Cardiomegaly and pericardial effusion appear grossly stable since 11/18/2017. Layering right pleural effusion has regressed. Subpulmonic left pleural effusion is stable. 4. Stable osseous metastatic disease. Electronically Signed   By: Genevie Ann M.D.   On: 11/29/2017 16:26   Dg C-arm 1-60 Min-no Report  Result Date: 11/30/2017 Fluoroscopy was  utilized by the requesting physician.  No radiographic interpretation.    Assessment/Plan: Melanie Holloway is a 52 y.o. female with hx distant Breast cancer, now being worked up by otpt oncology for lymphadenopathy (hilar, mediastinal, R intramammary and  R Kennedy and Axillary)  as well as pericardial effusion, progressive pulmonary process. She also has pancytopenia and now fevers.  She also has hx of lupus erythematosus, ANA > 1:1280 , but is not followed by rheumatology.   She is progressively ill over last several months with cough she reports beginning in Dec, night sweats, wt loss of 30-40 #s.  Differential dx is broad but I favor malignant or rheumatological process.  So far bcx neg, stool PCR,  C diff neg. Sputum cx pending. MRSA PCR neg.  Flu PCR neg x 2.  Resp PCR neg, ACE nml. Urine Lg neg, mycoplasma IgM neg.  2/12 -  Remains febrile, getting bronch. Repeat CT shows contined ground glass and nodular infiltrates. 2/13 remains febrile. Started solumedrol 2/12. Bronch studies P, GS with [facility]. Cr up to 2.3. On 2L O2- sats stable.  Walking around now, some improvement.  Pending - Mycoplasma serology, Anca, antigens for legionella urine strep Pna, fungal serology,  Recommendations Cont zosyn and azithromycin. Studies from bronch Pending -routine cx, Fungal, AFB smear and cx and viral culture Agree with continue  steroids for warm autoantibody   Thank you very much for the consult. Will follow with you.  Leonel Ramsay   12/01/2017, 1:54 PM

## 2017-12-01 NOTE — Progress Notes (Signed)
* [facility]   Assessment and Plan:  Bilateral pulmonary infiltrates with hemoptysis and persistent fevers in the setting of breast cancer, elevated ANA with lymphadenopathy, s/p bronchoscopy.  --Pt doing well s/p bronchoscopy, sent for pathology, micro, viral culture, AFB, fungal culture. Awaiting results.  --Pt being followed by Dr. Raul Del, [facility] pulmonary consulted for bronchoscopy, will sign off for now.     Date: 12/01/2017  MRN# [id number] Melanie Holloway [id number]   Melanie Holloway is a 52 y.o. old female seen in follow up for chief complaint of  Chief Complaint  Patient presents with  . Shortness of Breath     HPI:   No new complaints today, pt is breathing better and feels better. Temp was up to 102.2 this am.   Medication:    Current Facility-Administered Medications:  .  0.9 %  sodium chloride infusion, 250 mL, Intravenous, PRN, Salary, Montell D, MD .  0.9 %  sodium chloride infusion, , Intravenous, Continuous, Lateef, Munsoor, MD .  acetaminophen (TYLENOL) tablet 650 mg, 650 mg, Oral, Q4H PRN, Hillary Bow, MD, 650 mg at 12/01/17 0421 .  albuterol (PROVENTIL) (2.5 MG/3ML) 0.083% nebulizer solution 2.5 mg, 2.5 mg, Inhalation, Q4H PRN, Sudini, Srikar, MD .  ALPRAZolam Duanne Moron) tablet 0.25 mg, 0.25 mg, Oral, BID PRN, Salary, Montell D, MD, 0.25 mg at 11/29/17 1526 .  alum & mag hydroxide-simeth (MAALOX/MYLANTA) 200-200-20 MG/5ML suspension 15 mL, 15 mL, Oral, Q4H PRN, Sudini, Srikar, MD, 15 mL at 12/01/17 0918 .  azithromycin (ZITHROMAX) tablet 500 mg, 500 mg, Oral, Daily, Sudini, Srikar, MD, 500 mg at 12/01/17 0918 .  benzonatate (TESSALON) capsule 100 mg, 100 mg, Oral, TID PRN, Loletha Grayer, MD, 100 mg at 11/30/17 1750 .  budesonide (PULMICORT) nebulizer solution 0.5 mg, 0.5 mg, Nebulization, BID, Wieting, Richard, MD, 0.5 mg at 12/01/17 0926 .  butamben-tetracaine-benzocaine (CETACAINE) spray 1 spray, 1 spray, Topical, Once, Laverle Hobby, MD .  cholecalciferol (VITAMIN D) tablet 2,000 Units, 2,000 Units, Oral, Daily, Salary, Montell D, MD, 2,000 Units at 12/01/17 0918 .  ferrous sulfate tablet 325 mg, 325 mg, Oral, BID WC, Loletha Grayer, MD, 325 mg at 12/01/17 3818 .  guaiFENesin (ROBITUSSIN) 100 MG/5ML solution 100 mg, 5 mL, Oral, Q6H PRN, Loletha Grayer, MD, 100 mg at 11/30/17 1748 .  ipratropium-albuterol (DUONEB) 0.5-2.5 (3) MG/3ML nebulizer solution 3 mL, 3 mL, Nebulization, Q6H, Sudini, Srikar, MD, 3 mL at 12/01/17 0926 .  letrozole [facility]) tablet 2.5 mg, 2.5 mg, Oral, Daily, Salary, Montell D, MD, 2.5 mg at 12/01/17 0919 .  lidocaine (XYLOCAINE) 2 % jelly 1 application, 1 application, Topical, Once, Laverle Hobby, MD .  methylPREDNISolone sodium succinate (SOLU-MEDROL) 125 mg/2 mL injection 60 mg, 60 mg, Intravenous, Q24H, Sudini, Srikar, MD, 60 mg at 11/30/17 1747 .  metoprolol tartrate (LOPRESSOR) tablet 12.5 mg, 12.5 mg, Oral, BID, Sudini, Srikar, MD, 12.5 mg at 12/01/17 2993 .  multivitamin with minerals tablet 1 tablet, 1 tablet, Oral, Daily, Gouru, Aruna, MD, 1 tablet at 12/01/17 0918 .  ondansetron (ZOFRAN) injection 4 mg, 4 mg, Intravenous, Q6H PRN, Salary, Montell D, MD, 4 mg at 12/01/17 1029 .  phenylephrine (NEO-SYNEPHRINE) 0.25 % nasal spray 1 spray, 1 spray, Each Nare, Q6H PRN, Laverle Hobby, MD .  piperacillin-tazobactam (ZOSYN) IVPB 3.375 g, 3.375 g, Intravenous, Q8H, Lenis Noon, RPH, Stopped at 12/01/17 0915 .  protein supplement (PREMIER PROTEIN) liquid, 11 oz, Oral, BID BM, Gouru, Aruna, MD, 11 oz at 11/30/17 1400 .  sodium chloride  flush (NS) 0.9 % injection 3 mL, 3 mL, Intravenous, Q12H, Salary, Montell D, MD, 3 mL at 11/30/17 2228 .  sodium chloride flush (NS) 0.9 % injection 3 mL, 3 mL, Intravenous, PRN, Salary, Montell D, MD, 3 mL at 11/25/17 0621 .  triamcinolone cream (KENALOG) 0.1 % 1 application, 1 application, Topical, BID PRN, Salary, Montell D, MD   Allergies:    Patient has no known allergies.  Review of Systems: Gen:  Denies sweats. HEENT: Denies blurred vision. Cvc:  No dizziness, chest pain or heaviness Resp:   Denies cough Gi: Denies swallowing difficulty, stomach pain. constipation, bowel incontinence Gu:  Denies bladder incontinence, burning urine Ext:   No Joint pain, stiffness. Skin: No skin rash, easy bruising. Endoc:  No polyuria, polydipsia. Psych: No depression, insomnia. Other:  All other systems were reviewed and found to be negative other than what is mentioned in the HPI.   Physical Examination:   VS: BP 118/87 (BP Location: Right Arm)   Pulse (!) 117   Temp (!) 102.2 F (39 C) (Oral)   Resp 18   Ht 4\' 11"  (1.499 m)   Wt 150 lb 2.1 oz (68.1 kg)   SpO2 98%   BMI 30.32 kg/m    General Appearance: No distress  Neuro:without focal findings,  speech normal,  HEENT: PERRLA, EOM intact.  Pulmonary: normal breath sounds, scattered rhonchi.  CardiovascularNormal S1,S2.  No m/r/g.   Abdomen: Benign, Soft, non-tender. Renal:  No costovertebral tenderness  GU:  Not performed at this time. Endoc: No evident thyromegaly, no signs of acromegaly. Skin:   warm, no rash. Extremities: normal, no cyanosis, clubbing.   LABORATORY PANEL:   CBC Recent Labs  Lab 12/01/17 0301  WBC 3.4*  HGB 9.1*  HCT 26.3*  PLT 117*   ------------------------------------------------------------------------------------------------------------------  Chemistries  Recent Labs  Lab 12/01/17 0301  NA 128*  K 3.8  CL 100*  CO2 17*  GLUCOSE 165*  BUN 62*  CREATININE 2.35*  CALCIUM 8.4*   ------------------------------------------------------------------------------------------------------------------  Cardiac Enzymes No results for input(s): TROPONINI in the last 168 hours. ------------------------------------------------------------  RADIOLOGY:   No results found for this or any previous visit. Results for orders placed during  the hospital encounter of 11/18/17  DG Chest 2 View   Narrative CLINICAL DATA:  Shortness of breath. The patient underwent neck biopsy yesterday.  EXAM: CHEST  2 VIEW  COMPARISON:  Chest x-ray of November 19, 2017  FINDINGS: The lungs are well-expanded. The cardiac silhouette is enlarged. The pulmonary vascularity is not engorged. There calcification in the wall of the aortic arch. There surgical clips in the left axillary region. There is no pneumothorax or pneumomediastinum nor pleural effusion. Knee and the  IMPRESSION: Left lower lobe atelectasis or early pneumonia. Stable cardiomegaly without pulmonary edema. Followup PA and lateral chest X-ray is recommended in 3-4 weeks following trial of antibiotic therapy to ensure resolution and exclude underlying malignancy.  Thoracic aortic atherosclerosis.   Electronically Signed   By: David  Martinique M.D.   On: 11/23/2017 09:59    ------------------------------------------------------------------------------------------------------------------  Thank  you for allowing [facility] to assist in the care of your patient. Our recommendations are noted above.  Please contact us if we can be of further service.   Marda Stalker, MD.  [facility] Office Number: [phone]  Patricia Pesa, M.D.  Merton Border, M.D  12/01/2017

## 2017-12-01 NOTE — Plan of Care (Signed)
  Progressing Education: Knowledge of General Education information will improve 12/01/2017 0241 - Progressing by Hosie Spangle, RN Health Behavior/Discharge Planning: Ability to manage health-related needs will improve 12/01/2017 0241 - Progressing by Hosie Spangle, RN Clinical Measurements: Ability to maintain clinical measurements within normal limits will improve 12/01/2017 0241 - Progressing by Hosie Spangle, RN Will remain free from infection 12/01/2017 0241 - Progressing by Hosie Spangle, RN Diagnostic test results will improve 12/01/2017 0241 - Progressing by Hosie Spangle, RN Respiratory complications will improve 12/01/2017 0241 - Progressing by Hosie Spangle, RN Cardiovascular complication will be avoided 12/01/2017 0241 - Progressing by Hosie Spangle, RN Activity: Risk for activity intolerance will decrease 12/01/2017 0241 - Progressing by Hosie Spangle, RN Nutrition: Adequate nutrition will be maintained 12/01/2017 0241 - Progressing by Hosie Spangle, RN Coping: Level of anxiety will decrease 12/01/2017 0241 - Progressing by Hosie Spangle, RN Elimination: Will not experience complications related to bowel motility 12/01/2017 0241 - Progressing by Hosie Spangle, RN Will not experience complications related to urinary retention 12/01/2017 0241 - Progressing by Hosie Spangle, RN Pain Managment: General experience of comfort will improve 12/01/2017 0241 - Progressing by Hosie Spangle, RN Safety: Ability to remain free from injury will improve 12/01/2017 0241 - Progressing by Hosie Spangle, RN Skin Integrity: Risk for impaired skin integrity will decrease 12/01/2017 0241 - Progressing by Hosie Spangle, RN Activity: Ability to tolerate increased activity will improve 12/01/2017 0241 - Progressing by Hosie Spangle, RN Clinical Measurements: Ability to maintain a body temperature in the normal range will  improve 12/01/2017 0241 - Progressing by Hosie Spangle, RN Respiratory: Ability to maintain adequate ventilation will improve 12/01/2017 0241 - Progressing by Hosie Spangle, RN

## 2017-12-01 NOTE — Progress Notes (Signed)
PT Cancellation Note  Patient Details Name: Melanie Holloway MRN: [id number] DOB: 1966-09-27   Cancelled Treatment:    Reason Eval/Treat Not Completed: Other (comment). Treatment attempted x 2; initially sitting up in bed with food tray. Pt states she is trying to eat and refuses therapy at this time. Second attempt, pt with nursing staff for personal care and request later attempt. Re attempt at a later time date, as the schedule allows   Larae Grooms, PTA 12/01/2017, 11:23 AM

## 2017-12-01 NOTE — Progress Notes (Signed)
[facility]  ROUNDING NOTE   Subjective:  Patient seen at bedside. We were previously seeing her during this hospitalization. Her creatinine is worsened.  She was exposed to contrast earlier this admission as well. She was also on diuretic therapy but has been off for several days. No significant edema noted on exam at the moment. However patient also has history of positive ANA.   Objective:  Vital signs in last 24 hours:  Temp:  [97.3 F (36.3 C)-102.2 F (39 C)] 98.4 F (36.9 C) (02/13 1328) Pulse Rate:  [82-117] 90 (02/13 1328) Resp:  [18-24] 24 (02/13 1328) BP: (118-127)/(74-87) 125/80 (02/13 1328) SpO2:  [98 %-100 %] 100 % (02/13 1328) Weight:  [68.1 kg (150 lb 2.1 oz)] 68.1 kg (150 lb 2.1 oz) (02/13 0413)  Weight change: 1.421 kg (3 lb 2.1 oz) Filed Weights   11/29/17 0947 11/30/17 0500 12/01/17 0413  Weight: 66.7 kg (147 lb) 62.8 kg (138 lb 7.2 oz) 68.1 kg (150 lb 2.1 oz)    Intake/Output: I/O last 3 completed shifts: In: 849 [I.V.:20; Blood:629; IV Piggyback:200] Out: 550 [Urine:550]   Intake/Output this shift:  Total I/O In: -  Out: 100 [Urine:100]  Physical Exam: General: No acute distress  Head: Normocephalic, atraumatic. Moist oral mucosal membranes  Eyes: Anicteric  Neck: Supple, trachea midline  Lungs:  Scattered rhonchi, normal effort  Heart: S1S2 no rubs  Abdomen:  Soft, nontender, bowel sounds present  Extremities: trace peripheral edema.  Neurologic: Awake, alert, following commands  Skin: No lesions       Basic Metabolic Panel: Recent Labs  Lab 11/26/17 0805 11/28/17 0819 11/29/17 0306 11/30/17 1231 12/01/17 0301  NA 131* 128* 129* 129* 128*  K 4.0 3.9 3.9 3.4* 3.8  CL 103 101 100* 101 100*  CO2 20* 19* 19* 18* 17*  GLUCOSE 91 93 107* 136* 165*  BUN 39* 46* 45* 56* 62*  CREATININE 1.55* 1.79* 1.70* 2.20* 2.35*  CALCIUM 8.3* 8.0* 8.3* 8.2* 8.4*    Liver Function Tests: No results for input(s): AST, ALT, ALKPHOS,  BILITOT, PROT, ALBUMIN in the last 168 hours. No results for input(s): LIPASE, AMYLASE in the last 168 hours. No results for input(s): AMMONIA in the last 168 hours.  CBC: Recent Labs  Lab 11/26/17 0805 11/28/17 0819 11/29/17 0306 11/30/17 1231 12/01/17 0301  WBC 2.5* 3.0* 3.8 2.0* 3.4*  NEUTROABS 1.3* 2.0 2.6 1.5 2.9  HGB 8.2* 7.2* 7.7* 6.9* 9.1*  HCT 24.3* 21.4* 23.0* 20.8* 26.3*  MCV 86.6 86.5 87.6 86.2 86.5  PLT 67* 68* 76* 108* 117*    Cardiac Enzymes: No results for input(s): CKTOTAL, CKMB, CKMBINDEX, TROPONINI in the last 168 hours.  BNP: Invalid input(s): POCBNP  CBG: No results for input(s): GLUCAP in the last 168 hours.  Microbiology: Results for orders placed or performed during the hospital encounter of 11/18/17  MRSA PCR Screening     Status: None   Collection Time: 11/18/17  9:50 PM  Result Value Ref Range Status   MRSA by PCR NEGATIVE NEGATIVE Final    Comment:        The GeneXpert MRSA Assay (FDA approved for NASAL specimens only), is one component of a comprehensive MRSA colonization surveillance program. It is not intended to diagnose MRSA infection nor to guide or monitor treatment for MRSA infections. Performed at [facility], [address] [place], [place] [address]   MRSA PCR Screening     Status: None   Collection Time: 11/23/17  5:41 PM  Result Value Ref Range Status   MRSA by PCR NEGATIVE NEGATIVE Final    Comment:        The GeneXpert MRSA Assay (FDA approved for NASAL specimens only), is one component of a comprehensive MRSA colonization surveillance program. It is not intended to diagnose MRSA infection nor to guide or monitor treatment for MRSA infections. Performed at [facility], [address] [place], [place] [address]   Gastrointestinal Panel by PCR , Stool     Status: None   Collection Time: 11/26/17  8:12 AM  Result Value Ref Range Status   Campylobacter species NOT DETECTED NOT DETECTED  Final   Plesimonas shigelloides NOT DETECTED NOT DETECTED Final   Salmonella species NOT DETECTED NOT DETECTED Final   Yersinia enterocolitica NOT DETECTED NOT DETECTED Final   Vibrio species NOT DETECTED NOT DETECTED Final   Vibrio cholerae NOT DETECTED NOT DETECTED Final   Enteroaggregative E coli (EAEC) NOT DETECTED NOT DETECTED Final   Enteropathogenic E coli (EPEC) NOT DETECTED NOT DETECTED Final   Enterotoxigenic E coli (ETEC) NOT DETECTED NOT DETECTED Final   Shiga like toxin producing E coli (STEC) NOT DETECTED NOT DETECTED Final   Shigella/Enteroinvasive E coli (EIEC) NOT DETECTED NOT DETECTED Final   Cryptosporidium NOT DETECTED NOT DETECTED Final   Cyclospora cayetanensis NOT DETECTED NOT DETECTED Final   Entamoeba histolytica NOT DETECTED NOT DETECTED Final   Giardia lamblia NOT DETECTED NOT DETECTED Final   Adenovirus F40/41 NOT DETECTED NOT DETECTED Final   Astrovirus NOT DETECTED NOT DETECTED Final   Norovirus GI/GII NOT DETECTED NOT DETECTED Final   Rotavirus A NOT DETECTED NOT DETECTED Final   Sapovirus (I, II, IV, and V) NOT DETECTED NOT DETECTED Final    Comment: Performed at [facility], [address] [place], [place] [address]  C difficile quick scan w PCR reflex     Status: None   Collection Time: 11/26/17  8:12 AM  Result Value Ref Range Status   C Diff antigen NEGATIVE NEGATIVE Final   C Diff toxin NEGATIVE NEGATIVE Final   C Diff interpretation No C. difficile detected.  Final    Comment: Performed at [facility], [address] [place], [place] [address]  CULTURE, BLOOD (ROUTINE X 2) w Reflex to ID Panel     Status: None (Preliminary result)   Collection Time: 11/27/17  8:58 AM  Result Value Ref Range Status   Specimen Description BLOOD RIGHT ANTECUBITAL  Final   Special Requests   Final    BOTTLES DRAWN AEROBIC AND ANAEROBIC Blood Culture adequate volume   Culture   Final    NO GROWTH 4 DAYS Performed at [facility],  [address], [place], [place] [address]    Report Status PENDING  Incomplete  CULTURE, BLOOD (ROUTINE X 2) w Reflex to ID Panel     Status: None (Preliminary result)   Collection Time: 11/27/17 11:03 AM  Result Value Ref Range Status   Specimen Description BLOOD RIGHT ANTECUBITAL  Final   Special Requests   Final    BOTTLES DRAWN AEROBIC AND ANAEROBIC Blood Culture adequate volume   Culture   Final    NO GROWTH 4 DAYS Performed at [facility], [address], [place], [place] [address]    Report Status PENDING  Incomplete  Respiratory Panel by PCR     Status: None   Collection Time: 11/29/17  5:30 PM  Result Value Ref Range Status  Adenovirus NOT DETECTED NOT DETECTED Final   Coronavirus 229E NOT DETECTED NOT DETECTED Final   Coronavirus HKU1 NOT DETECTED NOT DETECTED Final   Coronavirus NL63 NOT DETECTED NOT DETECTED Final   Coronavirus OC43 NOT DETECTED NOT DETECTED Final   Metapneumovirus NOT DETECTED NOT DETECTED Final   Rhinovirus / Enterovirus NOT DETECTED NOT DETECTED Final   Influenza A NOT DETECTED NOT DETECTED Final   Influenza A H1 NOT DETECTED NOT DETECTED Final   Influenza A H1 2009 NOT DETECTED NOT DETECTED Final   Influenza A H3 NOT DETECTED NOT DETECTED Final   Influenza B NOT DETECTED NOT DETECTED Final   Parainfluenza Virus 1 NOT DETECTED NOT DETECTED Final   Parainfluenza Virus 2 NOT DETECTED NOT DETECTED Final   Parainfluenza Virus 3 NOT DETECTED NOT DETECTED Final   Parainfluenza Virus 4 NOT DETECTED NOT DETECTED Final   Respiratory Syncytial Virus NOT DETECTED NOT DETECTED Final   Bordetella pertussis NOT DETECTED NOT DETECTED Final   Chlamydophila pneumoniae NOT DETECTED NOT DETECTED Final   Mycoplasma pneumoniae NOT DETECTED NOT DETECTED Final    Comment: Performed at [facility], [place] [address], [place], [place] [address]  Culture, expectorated sputum-assessment     Status: None   Collection Time: 11/29/17  9:35 PM  Result Value  Ref Range Status   Specimen Description SPUTUM  Final   Special Requests NONE  Final   Sputum evaluation   Final    THIS SPECIMEN IS ACCEPTABLE FOR SPUTUM CULTURE Performed at [facility], [address] [place], [place] [address]    Report Status 11/30/2017 FINAL  Final  Culture, respiratory (NON-Expectorated)     Status: None (Preliminary result)   Collection Time: 11/29/17  9:35 PM  Result Value Ref Range Status   Specimen Description   Final    SPUTUM Performed at [facility], [address] [place], [place] [address]    Special Requests   Final    NONE Reflexed from [id number] Performed at [facility], [address] [place], [place] [address]    Gram Stain   Final    RARE WBC PRESENT, PREDOMINANTLY PMN FEW YEAST RARE GRAM POSITIVE COCCI IN PAIRS    Culture   Final    CULTURE REINCUBATED FOR BETTER GROWTH Performed at [facility], [place] [address], [place], [place] [address]    Report Status PENDING  Incomplete  Culture, bal-quantitative     Status: None (Preliminary result)   Collection Time: 11/30/17  2:50 PM  Result Value Ref Range Status   Specimen Description   Final    Bronch Lavag Performed at [facility], [address], [place], [place] [address]    Special Requests   Final    Immunocompromised Performed at [facility], [address] [place], [place] [address]    Gram Stain   Final    RARE WBC PRESENT, PREDOMINANTLY PMN NO ORGANISMS SEEN    Culture   Final    NO GROWTH < 24 HOURS Performed at [facility], [place] [address], [place], [place] [address]    Report Status PENDING  Incomplete  Culture, fungus without smear     Status: None (Preliminary result)   Collection Time: 11/30/17  2:50 PM  Result Value Ref Range Status   Specimen Description   Final    BRONCHIAL ALVEOLAR LAVAGE Performed at [facility], [address] [place], [place] [address]    Special  Requests    Final    Immunocompromised Performed at [facility], [address], [place], [place] [address]    Culture   Final    NO FUNGUS ISOLATED AFTER 1 DAY Performed at [facility], [place] [address] [place], [place] [address]    Report Status PENDING  Incomplete    Coagulation Studies: No results for input(s): LABPROT, INR in the last 72 hours.  Urinalysis: No results for input(s): COLORURINE, LABSPEC, PHURINE, GLUCOSEU, HGBUR, BILIRUBINUR, KETONESUR, PROTEINUR, UROBILINOGEN, NITRITE, LEUKOCYTESUR in the last 72 hours.  Invalid input(s): APPERANCEUR    Imaging: Dg Chest 1 View  Result Date: 11/30/2017 CLINICAL DATA:  Post bronchoscopy chest x-ray. History of breast malignancy, anemia, and abnormal chest CT findings on November 29, 2017. EXAM: CHEST 1 VIEW COMPARISON:  Chest x-ray of November 26, 2017. FINDINGS: The lungs are adequately inflated. There is no pneumothorax. The interstitial markings are increased bilaterally and more conspicuous than on the previous study. The hemidiaphragms remain visible. The cardiac silhouette remains enlarged. The pulmonary vascularity is mildly engorged. The trachea is midline. There surgical clips in the left axillary region. IMPRESSION: No postprocedure pneumothorax. Interstitial changes in the setting of cardiomegaly and pulmonary vascular congestion is consistent with mild CHF. Electronically Signed   By: David  Martinique M.D.   On: 11/30/2017 14:39   Ct Chest Wo Contrast  Result Date: 11/29/2017 CLINICAL DATA:  52 year old female with hemoptysis and cough. Metastatic breast cancer. EXAM: CT CHEST WITHOUT CONTRAST TECHNIQUE: Multidetector CT imaging of the chest was performed following the standard protocol without IV contrast. COMPARISON:  Chest CTA 11/18/2017 and earlier. FINDINGS: Cardiovascular: Allowing for absent IV contrast cardiomegaly and pericardial effusion appears stable since 11/18/2017. Vascular patency is not evaluated in the  absence of IV contrast. Calcified coronary artery atherosclerosis is evident. Lesser calcified aortic atherosclerosis. Mediastinum/Nodes: Extensive mediastinal and bilateral hilar lymphadenopathy has not significantly changed since 11/18/2017. Continued extensive right axillary lymphadenopathy, stable. Lungs/Pleura: Widespread bilateral centrilobular pulmonary ground-glass nodularity, less pronounced in the lung apices, largely new since 11/04/2017 and more extensive and nodular in appearance compared to 11/18/2017. Continued confluent ground-glass opacity throughout the left lower lobe and right costophrenic angle. Decreased layering right pleural effusion since 11/18/2017. Stable to mildly decreased left sub pulmonic effusion. Areas of subpleural nodularity and thickening in the left lung which may be post radiation related in light of prior left mastectomy. Upper Abdomen: Stable visible noncontrast upper abdominal viscera. Musculoskeletal: Mixed lytic and sclerotic skeletal metastases appear stable since 11/04/2017. IMPRESSION: 1. Continued widespread abnormal predominantly ground-glass pulmonary opacity, similar in distribution although more nodular in appearance since 11/18/2017. Differential considerations are fairly broad and include infectious as well as noninfectious inflammation (including vasculitis), and lymphangitic carcinomatosis (in light of #2). Consider atypical infectious etiology if the patient is immunocompromised. 2. Continued extensive mediastinal and hilar lymphadenopathy. Continued extensive right axillary lymphadenopathy. 3. Cardiomegaly and pericardial effusion appear grossly stable since 11/18/2017. Layering right pleural effusion has regressed. Subpulmonic left pleural effusion is stable. 4. Stable osseous metastatic disease. Electronically Signed   By: Genevie Ann M.D.   On: 11/29/2017 16:26   US Renal  Result Date: 12/01/2017 CLINICAL DATA:  Acute renal failure. EXAM: RENAL / URINARY  TRACT ULTRASOUND COMPLETE COMPARISON:  CT abdomen pelvis 11/04/2017. FINDINGS: Right Kidney: Length: 9.4 cm. Echogenicity within normal limits. No mass or hydronephrosis visualized. Extrarenal pelvis is noted. Left Kidney: Length: 10.3 cm. Echogenicity within normal limits. No mass or hydronephrosis visualized. Bladder: Poorly distended. IMPRESSION:  Normal exam. Electronically Signed   By: Lorin Picket M.D.   On: 12/01/2017 14:43   Dg C-arm 1-60 Min-no Report  Result Date: 11/30/2017 Fluoroscopy was utilized by the requesting physician.  No radiographic interpretation.     Medications:   . sodium chloride    . sodium chloride    . piperacillin-tazobactam (ZOSYN)  IV Stopped (12/01/17 0915)   . azithromycin  500 mg Oral Daily  . budesonide (PULMICORT) nebulizer solution  0.5 mg Nebulization BID  . butamben-tetracaine-benzocaine  1 spray Topical Once  . cholecalciferol  2,000 Units Oral Daily  . ferrous sulfate  325 mg Oral BID WC  . ipratropium-albuterol  3 mL Nebulization Q6H  . letrozole  2.5 mg Oral Daily  . lidocaine  1 application Topical Once  . methylPREDNISolone (SOLU-MEDROL) injection  60 mg Intravenous Q24H  . metoprolol tartrate  12.5 mg Oral BID  . multivitamin with minerals  1 tablet Oral Daily  . protein supplement shake  11 oz Oral BID BM  . sodium chloride flush  3 mL Intravenous Q12H   sodium chloride, acetaminophen, albuterol, ALPRAZolam, alum & mag hydroxide-simeth, benzonatate, guaiFENesin, ondansetron (ZOFRAN) IV, phenylephrine, sodium chloride flush, triamcinolone cream  Assessment/ Plan:  52 y.o. female with hypertension, anemia, vitamin D deficiency, breast cancer status post left mastectomy, chemotherapy and radiation   1.  Acute renal failure with chronic kidney disease stage III with history of proteinuria, hematuria, and history of positive ANA.  Renal function has been worsening since admission.  She was exposed to contrast earlier in the admission and  was also on diuretic therapy.  However diuretic therapy has been off for several days now.  She does not appear to be volume overloaded at the moment.  We will start with gentle IV fluid hydration with 0.9 normal saline at 50 cc/h.  Renal ultrasound was performed and was negative.  We will perform additional and repeat serologic workup as well.  2.  Hypertension.  Maintain the patient on metoprolol for now.   LOS: 13 Jessicca Stitzer 2/13/20193:51 PM

## 2017-12-02 DIAGNOSIS — R05 Cough: Secondary | ICD-10-CM

## 2017-12-02 DIAGNOSIS — R197 Diarrhea, unspecified: Secondary | ICD-10-CM

## 2017-12-02 LAB — CBC WITH DIFFERENTIAL/PLATELET
Basophils Absolute: 0 10*3/uL (ref 0–0.1)
Basophils Relative: 1 %
Eosinophils Absolute: 0.1 10*3/uL (ref 0–0.7)
Eosinophils Relative: 2 %
HCT: 25.9 % — ABNORMAL LOW (ref 35.0–47.0)
Hemoglobin: 9 g/dL — ABNORMAL LOW (ref 12.0–16.0)
Lymphocytes Relative: 10 %
Lymphs Abs: 0.3 10*3/uL — ABNORMAL LOW (ref 1.0–3.6)
MCH: 30.3 pg (ref 26.0–34.0)
MCHC: 34.8 g/dL (ref 32.0–36.0)
MCV: 87 fL (ref 80.0–100.0)
Monocytes Absolute: 0.1 10*3/uL — ABNORMAL LOW (ref 0.2–0.9)
Monocytes Relative: 4 %
Neutro Abs: 2.5 10*3/uL (ref 1.4–6.5)
Neutrophils Relative %: 83 %
Platelets: 99 10*3/uL — ABNORMAL LOW (ref 150–440)
RBC: 2.98 MIL/uL — ABNORMAL LOW (ref 3.80–5.20)
RDW: 15.4 % — ABNORMAL HIGH (ref 11.5–14.5)
WBC: 3 10*3/uL — ABNORMAL LOW (ref 3.6–11.0)

## 2017-12-02 LAB — MULTIPLE MYELOMA PANEL, SERUM
Albumin SerPl Elph-Mcnc: 2.5 g/dL — ABNORMAL LOW (ref 2.9–4.4)
Albumin/Glob SerPl: 0.5 — ABNORMAL LOW (ref 0.7–1.7)
Alpha 1: 0.2 g/dL (ref 0.0–0.4)
Alpha2 Glob SerPl Elph-Mcnc: 0.8 g/dL (ref 0.4–1.0)
B-Globulin SerPl Elph-Mcnc: 0.4 g/dL — ABNORMAL LOW (ref 0.7–1.3)
Gamma Glob SerPl Elph-Mcnc: 3.8 g/dL — ABNORMAL HIGH (ref 0.4–1.8)
Globulin, Total: 5.1 g/dL — ABNORMAL HIGH (ref 2.2–3.9)
IgA: 150 mg/dL (ref 87–352)
IgG (Immunoglobin G), Serum: 3538 mg/dL — ABNORMAL HIGH (ref 700–1600)
IgM (Immunoglobulin M), Srm: 836 mg/dL — ABNORMAL HIGH (ref 26–217)
Total Protein ELP: 7.6 g/dL (ref 6.0–8.5)

## 2017-12-02 LAB — BASIC METABOLIC PANEL
ANION GAP: 11 (ref 5–15)
BUN: 75 mg/dL — ABNORMAL HIGH (ref 6–20)
CALCIUM: 8.5 mg/dL — AB (ref 8.9–10.3)
CO2: 18 mmol/L — AB (ref 22–32)
Chloride: 102 mmol/L (ref 101–111)
Creatinine, Ser: 3.06 mg/dL — ABNORMAL HIGH (ref 0.44–1.00)
GFR calc non Af Amer: 16 mL/min — ABNORMAL LOW (ref >60)
GFR, EST AFRICAN AMERICAN: 19 mL/min — AB (ref >60)
Glucose, Bld: 158 mg/dL — ABNORMAL HIGH (ref 65–99)
Potassium: 3.9 mmol/L (ref 3.5–5.1)
Sodium: 131 mmol/L — ABNORMAL LOW (ref 135–145)

## 2017-12-02 LAB — ANTI-DNA ANTIBODY, DOUBLE-STRANDED: ds DNA Ab: 2 IU/mL (ref 0–9)

## 2017-12-02 LAB — CULTURE, BLOOD (ROUTINE X 2)
Culture: NO GROWTH
Culture: NO GROWTH
SPECIAL REQUESTS: ADEQUATE
Special Requests: ADEQUATE

## 2017-12-02 LAB — PROTIME-INR
INR: 1.07
Prothrombin Time: 13.8 seconds (ref 11.4–15.2)

## 2017-12-02 LAB — HEPATIC FUNCTION PANEL
ALK PHOS: 37 U/L — AB (ref 38–126)
ALT: 30 U/L (ref 14–54)
AST: 54 U/L — ABNORMAL HIGH (ref 15–41)
Albumin: 2.2 g/dL — ABNORMAL LOW (ref 3.5–5.0)
BILIRUBIN INDIRECT: 0.8 mg/dL (ref 0.3–0.9)
Bilirubin, Direct: 0.1 mg/dL (ref 0.1–0.5)
TOTAL PROTEIN: 7.1 g/dL (ref 6.5–8.1)
Total Bilirubin: 0.9 mg/dL (ref 0.3–1.2)

## 2017-12-02 LAB — TYPE AND SCREEN
ABO/RH(D): B POS
ANTIBODY SCREEN: NEGATIVE

## 2017-12-02 LAB — C-REACTIVE PROTEIN

## 2017-12-02 LAB — ACID FAST SMEAR (AFB, MYCOBACTERIA): Acid Fast Smear: NEGATIVE

## 2017-12-02 LAB — CK: CK TOTAL: 24 U/L — AB (ref 38–234)

## 2017-12-02 LAB — ANA W/REFLEX IF POSITIVE: ANA: POSITIVE — AB

## 2017-12-02 LAB — C4 COMPLEMENT: Complement C4, Body Fluid: 4 mg/dL — ABNORMAL LOW (ref 14–44)

## 2017-12-02 LAB — C3 COMPLEMENT: C3 Complement: 21 mg/dL — ABNORMAL LOW (ref 82–167)

## 2017-12-02 LAB — SURGICAL PATHOLOGY

## 2017-12-02 LAB — SEDIMENTATION RATE: SED RATE: 129 mm/h — AB (ref 0–30)

## 2017-12-02 LAB — CYTOLOGY - NON PAP

## 2017-12-02 MED ORDER — GUAIFENESIN 100 MG/5ML PO SOLN
10.0000 mL | Freq: Four times a day (QID) | ORAL | Status: DC | PRN
  Administered 2017-12-02 – 2017-12-06 (×4): 200 mg via ORAL
  Filled 2017-12-02 (×5): qty 10

## 2017-12-02 MED ORDER — SODIUM CHLORIDE 0.9 % IV SOLN: INTRAVENOUS | Status: DC

## 2017-12-02 MED ORDER — PIPERACILLIN-TAZOBACTAM 3.375 G IVPB
3.3750 g | Freq: Two times a day (BID) | INTRAVENOUS | Status: DC
  Administered 2017-12-02 – 2017-12-03 (×2): 3.375 g via INTRAVENOUS
  Filled 2017-12-02 (×2): qty 50

## 2017-12-02 NOTE — Progress Notes (Signed)
Physical Therapy Treatment Patient Details Name: Melanie Holloway MRN: [id number] DOB: 08-23-1966 Today's Date: 12/02/2017    History of Present Illness Pt is a 52 y.o. female with a known history of right breast cancer with metastases/probable tumor recurrence, anemia-status post 2 packed red blood cells transfusion within the last week, beta thalassemia, lupus, presenting with 1-2-week history of worsening breath, worse with recumbency, was seen by primary care provider 3 days ago-prescribed doxycycline/prednisone, no improvement, echocardiogram done recently noted for ejection fraction 45-50%, in the emergency room patient tachycardic, tachypneic, hypertensive, chest x-ray noted for cardiomegaly with edema, troponin 0.1, CT chest noted for progression of interstitial disease/airspace disease for which could be related to infection/inflammation/edema/hemorrhage-CT report nonhelpful, patient evaluated in the emergency room, husband at the bedside, patient resting comfortably on BiPAP, noted Rales up to mid back bilaterally with bilateral lower extremity edema, worse with recumbency, positive orthopnea, patient is now been admitted for acute hypoxic respiratory failure and acute newly diagnosed systolic congestive heart failure exacerbation most likely exacerbated by recent blood transfusion.    PT Comments    Pt initially refuses PT; reports having bouts of feeling anxious/feeling depressed. With gentle encouragement, pt agreeable to exercises. Pt participates well without assist. MD in for visit; no further exercise/treatment at this time. Continue PT to progress strength, endurance as tolerated to improve functional mobility.    Follow Up Recommendations  Home health PT     Equipment Recommendations  Rolling walker with 5" wheels    Recommendations for Other Services       Precautions / Restrictions Precautions Precautions: Fall Restrictions Weight Bearing Restrictions: No    Mobility  Bed Mobility               General bed mobility comments: Not tested; pt refuses out of bed  Transfers                    Ambulation/Gait                 Stairs            Wheelchair Mobility    Modified Rankin (Stroke Patients Only)       Balance                                            Cognition Arousal/Alertness: Awake/alert Behavior During Therapy: WFL for tasks assessed/performed Overall Cognitive Status: Within Functional Limits for tasks assessed                                        Exercises General Exercises - Lower Extremity Ankle Circles/Pumps: AROM;Both;20 reps;Supine Quad Sets: Strengthening;Both;20 reps;Supine Gluteal Sets: Strengthening;Both;20 reps;Supine Short Arc Quad: AROM;Both;20 reps;Supine Heel Slides: AROM;20 reps;Supine;Right    General Comments        Pertinent Vitals/Pain Pain Assessment: No/denies pain    Home Living                      Prior Function            PT Goals (current goals can now be found in the care plan section) Progress towards PT goals: Progressing toward goals(slow)    Frequency    Min 2X/week      PT Plan Current plan remains  appropriate    Co-evaluation              AM-PAC PT "6 Clicks" Daily Activity  Outcome Measure  Difficulty turning over in bed (including adjusting bedclothes, sheets and blankets)?: A Little Difficulty moving from lying on back to sitting on the side of the bed? : A Little Difficulty sitting down on and standing up from a chair with arms (e.g., wheelchair, bedside commode, etc,.)?: Unable Help needed moving to and from a bed to chair (including a wheelchair)?: A Little Help needed walking in hospital room?: A Little Help needed climbing 3-5 steps with a railing? : A Lot 6 Click Score: 15    End of Session Equipment Utilized During Treatment: Oxygen Activity Tolerance: Patient tolerated  treatment well;Other (comment)(self limited ) Patient left: in bed;with call bell/phone within reach;with bed alarm set   PT Visit Diagnosis: Unsteadiness on feet (R26.81);Muscle weakness (generalized) (M62.81)     Time: 8563-1497 PT Time Calculation (min) (ACUTE ONLY): 16 min  Charges:  $Therapeutic Exercise: 8-22 mins                    G Codes:        Larae Grooms, PTA 12/02/2017, 5:35 PM

## 2017-12-02 NOTE — Progress Notes (Signed)
Pt refused to allow this RN to obtain her VS because she was eating. Will attempt at a later time.

## 2017-12-02 NOTE — Progress Notes (Signed)
Patient ID: Melanie Holloway, female   DOB: 1966-03-15, 52 y.o.   MRN: [id number]  [facility] PROGRESS NOTE  Melanie Holloway VZC:[id number] DOB: 1966-06-25 DOA: 11/18/2017 PCP: Ria Bush, MD  HPI/Subjective:  Still on oxygen. Afebrile today. SOB improving  Objective: Vitals:   12/02/17 1100 12/02/17 1220  BP: 128/74 (!) 148/95  Pulse: 97 99  Resp: 16 20  Temp: 98.1 F (36.7 C) 98.5 F (36.9 C)  SpO2:  100%    Filed Weights   11/30/17 0500 12/01/17 0413 12/02/17 0500  Weight: 62.8 kg (138 lb 7.2 oz) 68.1 kg (150 lb 2.1 oz) 68.9 kg (151 lb 12.8 oz)    ROS: Review of Systems  Constitutional: Positive for malaise/fatigue. Negative for chills and fever.  HENT: Negative for congestion and sore throat.   Eyes: Negative for blurred vision.  Respiratory: Positive for cough, shortness of breath and wheezing.   Cardiovascular: Positive for orthopnea. Negative for chest pain.  Gastrointestinal: Positive for diarrhea. Negative for abdominal pain, constipation, nausea and vomiting.  Genitourinary: Negative for dysuria.  Musculoskeletal: Negative for joint pain.  Neurological: Negative for dizziness and headaches.   Exam: Physical Exam  HENT:  Nose: No mucosal edema.  Mouth/Throat: No oropharyngeal exudate or posterior oropharyngeal edema.  Eyes: Conjunctivae, EOM and lids are normal. Pupils are equal, round, and reactive to light.  Neck: No JVD present. Carotid bruit is not present. No edema present. No thyroid mass and no thyromegaly present.  Cardiovascular: S1 normal and S2 normal. Exam reveals no gallop.  No murmur heard. Pulses:      Dorsalis pedis pulses are 2+ on the right side, and 2+ on the left side.  Respiratory: No respiratory distress. She has no wheezes. She has no rhonchi.  GI: Soft. Bowel sounds are normal. There is no tenderness.  Musculoskeletal:       Right ankle: She exhibits swelling.       Left ankle: She exhibits swelling.  Lymphadenopathy:    She  has no cervical adenopathy.  Neurological: She is alert. No cranial nerve deficit.  Skin: Skin is warm. No rash noted. Nails show no clubbing.  Psychiatric: She has a normal mood and affect.   Data Reviewed: Basic Metabolic Panel: Recent Labs  Lab 11/28/17 0819 11/29/17 0306 11/30/17 1231 12/01/17 0301 12/02/17 0602  NA 128* 129* 129* 128* 131*  K 3.9 3.9 3.4* 3.8 3.9  CL 101 100* 101 100* 102  CO2 19* 19* 18* 17* 18*  GLUCOSE 93 107* 136* 165* 158*  BUN 46* 45* 56* 62* 75*  CREATININE 1.79* 1.70* 2.20* 2.35* 3.06*  CALCIUM 8.0* 8.3* 8.2* 8.4* 8.5*   CBC: Recent Labs  Lab 11/28/17 0819 11/29/17 0306 11/30/17 1231 12/01/17 0301 12/02/17 0602  WBC 3.0* 3.8 2.0* 3.4* 3.0*  NEUTROABS 2.0 2.6 1.5 2.9 2.5  HGB 7.2* 7.7* 6.9* 9.1* 9.0*  HCT 21.4* 23.0* 20.8* 26.3* 25.9*  MCV 86.5 87.6 86.2 86.5 87.0  PLT 68* 76* 108* 117* 99*   Cardiac Enzymes: No results for input(s): CKTOTAL, CKMB, CKMBINDEX, TROPONINI in the last 168 hours. BNP (last 3 results) Recent Labs    11/18/17 1715 11/20/17 0405  BNP 2,975.0* 852.0*     CBG: No results for input(s): GLUCAP in the last 168 hours.  Recent Results (from the past 240 hour(s))  MRSA PCR Screening     Status: None   Collection Time: 11/23/17  5:41 PM  Result Value Ref Range Status   MRSA by PCR NEGATIVE NEGATIVE  Final    Comment:        The GeneXpert MRSA Assay (FDA approved for NASAL specimens only), is one component of a comprehensive MRSA colonization surveillance program. It is not intended to diagnose MRSA infection nor to guide or monitor treatment for MRSA infections. Performed at [facility], [address] [place], [place] [address]   Gastrointestinal Panel by PCR , Stool     Status: None   Collection Time: 11/26/17  8:12 AM  Result Value Ref Range Status   Campylobacter species NOT DETECTED NOT DETECTED Final   Plesimonas shigelloides NOT DETECTED NOT DETECTED Final   Salmonella species NOT  DETECTED NOT DETECTED Final   Yersinia enterocolitica NOT DETECTED NOT DETECTED Final   Vibrio species NOT DETECTED NOT DETECTED Final   Vibrio cholerae NOT DETECTED NOT DETECTED Final   Enteroaggregative E coli (EAEC) NOT DETECTED NOT DETECTED Final   Enteropathogenic E coli (EPEC) NOT DETECTED NOT DETECTED Final   Enterotoxigenic E coli (ETEC) NOT DETECTED NOT DETECTED Final   Shiga like toxin producing E coli (STEC) NOT DETECTED NOT DETECTED Final   Shigella/Enteroinvasive E coli (EIEC) NOT DETECTED NOT DETECTED Final   Cryptosporidium NOT DETECTED NOT DETECTED Final   Cyclospora cayetanensis NOT DETECTED NOT DETECTED Final   Entamoeba histolytica NOT DETECTED NOT DETECTED Final   Giardia lamblia NOT DETECTED NOT DETECTED Final   Adenovirus F40/41 NOT DETECTED NOT DETECTED Final   Astrovirus NOT DETECTED NOT DETECTED Final   Norovirus GI/GII NOT DETECTED NOT DETECTED Final   Rotavirus A NOT DETECTED NOT DETECTED Final   Sapovirus (I, II, IV, and V) NOT DETECTED NOT DETECTED Final    Comment: Performed at [facility], [address] [place], [place] [address]  C difficile quick scan w PCR reflex     Status: None   Collection Time: 11/26/17  8:12 AM  Result Value Ref Range Status   C Diff antigen NEGATIVE NEGATIVE Final   C Diff toxin NEGATIVE NEGATIVE Final   C Diff interpretation No C. difficile detected.  Final    Comment: Performed at [facility], [address] [place], [place] [address]  CULTURE, BLOOD (ROUTINE X 2) w Reflex to ID Panel     Status: None   Collection Time: 11/27/17  8:58 AM  Result Value Ref Range Status   Specimen Description BLOOD RIGHT ANTECUBITAL  Final   Special Requests   Final    BOTTLES DRAWN AEROBIC AND ANAEROBIC Blood Culture adequate volume   Culture   Final    NO GROWTH 5 DAYS Performed at [facility], [address] [place], [place] [address]    Report Status 12/02/2017 FINAL  Final  CULTURE, BLOOD (ROUTINE X  2) w Reflex to ID Panel     Status: None   Collection Time: 11/27/17 11:03 AM  Result Value Ref Range Status   Specimen Description BLOOD RIGHT ANTECUBITAL  Final   Special Requests   Final    BOTTLES DRAWN AEROBIC AND ANAEROBIC Blood Culture adequate volume   Culture   Final    NO GROWTH 5 DAYS Performed at [facility], [address] [place], [place] [address]    Report Status 12/02/2017 FINAL  Final  Respiratory Panel by PCR     Status: None   Collection Time: 11/29/17  5:30 PM  Result Value Ref Range Status   Adenovirus NOT DETECTED NOT DETECTED Final   Coronavirus 229E NOT DETECTED NOT DETECTED Final  Coronavirus HKU1 NOT DETECTED NOT DETECTED Final   Coronavirus NL63 NOT DETECTED NOT DETECTED Final   Coronavirus OC43 NOT DETECTED NOT DETECTED Final   Metapneumovirus NOT DETECTED NOT DETECTED Final   Rhinovirus / Enterovirus NOT DETECTED NOT DETECTED Final   Influenza A NOT DETECTED NOT DETECTED Final   Influenza A H1 NOT DETECTED NOT DETECTED Final   Influenza A H1 2009 NOT DETECTED NOT DETECTED Final   Influenza A H3 NOT DETECTED NOT DETECTED Final   Influenza B NOT DETECTED NOT DETECTED Final   Parainfluenza Virus 1 NOT DETECTED NOT DETECTED Final   Parainfluenza Virus 2 NOT DETECTED NOT DETECTED Final   Parainfluenza Virus 3 NOT DETECTED NOT DETECTED Final   Parainfluenza Virus 4 NOT DETECTED NOT DETECTED Final   Respiratory Syncytial Virus NOT DETECTED NOT DETECTED Final   Bordetella pertussis NOT DETECTED NOT DETECTED Final   Chlamydophila pneumoniae NOT DETECTED NOT DETECTED Final   Mycoplasma pneumoniae NOT DETECTED NOT DETECTED Final    Comment: Performed at [facility], [place] [address], [place], [place] [address]  Culture, expectorated sputum-assessment     Status: None   Collection Time: 11/29/17  9:35 PM  Result Value Ref Range Status   Specimen Description SPUTUM  Final   Special Requests NONE  Final   Sputum evaluation   Final    THIS  SPECIMEN IS ACCEPTABLE FOR SPUTUM CULTURE Performed at [facility], [address] [place], [place] [address]    Report Status 11/30/2017 FINAL  Final  Culture, respiratory (NON-Expectorated)     Status: None (Preliminary result)   Collection Time: 11/29/17  9:35 PM  Result Value Ref Range Status   Specimen Description   Final    SPUTUM Performed at [facility], [address], [place], [place] [address]    Special Requests   Final    NONE Reflexed from [phone] Performed at [facility], [address] [place], [place] [address]    Gram Stain   Final    RARE WBC PRESENT, PREDOMINANTLY PMN FEW YEAST RARE GRAM POSITIVE COCCI IN PAIRS    Culture   Final    FEW YEAST IDENTIFICATION TO FOLLOW Performed at [facility], [place] [address] [place], [place] [address]    Report Status PENDING  Incomplete  Culture, bal-quantitative     Status: None (Preliminary result)   Collection Time: 11/30/17  2:50 PM  Result Value Ref Range Status   Specimen Description   Final    Bronch Lavag Performed at [facility], [address] [place], [place] [address]    Special Requests   Final    Immunocompromised Performed at [facility], [address] [place], [place] [address]    Gram Stain   Final    RARE WBC PRESENT, PREDOMINANTLY PMN NO ORGANISMS SEEN    Culture   Final    CULTURE REINCUBATED FOR BETTER GROWTH Performed at [facility], [place] [address], [place], [place] [address]    Report Status PENDING  Incomplete  Acid Fast Smear (AFB)     Status: None   Collection Time: 11/30/17  2:50 PM  Result Value Ref Range Status   AFB Specimen Processing Concentration  Final   Acid Fast Smear Negative  Final    Comment: (NOTE) Performed At: [facility] [address] [place], [place] [id number] Rush Farmer MD LY:[id number]    Source (AFB) BRONCHIAL ALVEOLAR LAVAGE  Final  Comment: Performed at [facility], [address] [place], [place] [address]  Culture, fungus without smear     Status: None (Preliminary result)   Collection Time: 11/30/17  2:50 PM  Result Value Ref Range Status   Specimen Description   Final    BRONCHIAL ALVEOLAR LAVAGE Performed at [facility], [address] [place], [place] [address]    Special Requests   Final    Immunocompromised Performed at [facility], [address], [place], [place] [address]    Culture   Final    NO FUNGUS ISOLATED AFTER 2 DAYS Performed at [facility], [place] [address] [place], [place] [address]    Report Status PENDING  Incomplete      Scheduled Meds: . budesonide (PULMICORT) nebulizer solution  0.5 mg Nebulization BID  . butamben-tetracaine-benzocaine  1 spray Topical Once  . cholecalciferol  2,000 Units Oral Daily  . ferrous sulfate  325 mg Oral BID WC  . ipratropium-albuterol  3 mL Nebulization Q6H  . letrozole  2.5 mg Oral Daily  . lidocaine  1 application Topical Once  . methylPREDNISolone (SOLU-MEDROL) injection  60 mg Intravenous Q24H  . metoprolol tartrate  12.5 mg Oral BID  . multivitamin with minerals  1 tablet Oral Daily  . protein supplement shake  11 oz Oral BID BM  . sodium chloride flush  3 mL Intravenous Q12H   Continuous Infusions: . sodium chloride    . [START ON 12/03/2017] sodium chloride    . piperacillin-tazobactam (ZOSYN)  IV      Assessment/Plan:    * AKI Discussed with nephrology Dr. Holley Raring.  History of lupus. Renal ultrasound normal. 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 Kidney biopsy tomorrow  * HCAP with on going fever- SOB better today Associated with Acute hypoxic resp failure Continues to have fevers.      Discussed with Dr. Ola Spurr.   On IV Zosyn and azithromycin. Bronchoscopy 11/30/2017 Repeat CT scan of the chest continues to show bilateral infiltrates.  * Acute systolic congestive heart failure with EF 45-50%.   Resolved  * Acute kidney injury  on chronic kidney disease stage III.   * Metastatic breast cancer. LN biopsy negative for breast cancer.  .  Patient also has a pericardial effusion and lymphadenopathy seen on the chest. Malignant? Could also be due to Lupus.  * Essential hypertension.  Blood pressure on the lower side.  Held meds  * Weakness.  Physical therapy recommended home health.  * Pancytopenia with cancer history - Appreciate oncology input.   Code Status:     Code Status Orders  (From admission, onward)        Start     Ordered   11/18/17 2216  Full code  Continuous     11/18/17 2217    Code Status History    Date Active Date Inactive Code Status Order ID Comments User Context   09/22/2017 03:26 09/22/2017 19:40 Full Code [id number]  Lance Coon, MD Inpatient      Time spent: 35 minutes  [facility]

## 2017-12-02 NOTE — Progress Notes (Signed)
[facility] Hematology/Oncology Progress Note  Date of admission: 11/18/2017  Hospital day:  12/01/2017  Chief Complaint: Melanie Holloway is a 52 y.o. female with a history of stage IIIA left breast cancer who was admitted through the emergency room with shortness of breath and acute respiratory failure.  Subjective:  Feeling better.  Cough with some hemoptysis post bronchoscopy.  Loose stool.  Fever 102.2 at 4:15 AM.  Social History: The patient is alone today.  Allergies: No Known Allergies  Scheduled Medications: . azithromycin  500 mg Oral Daily  . budesonide (PULMICORT) nebulizer solution  0.5 mg Nebulization BID  . butamben-tetracaine-benzocaine  1 spray Topical Once  . cholecalciferol  2,000 Units Oral Daily  . ferrous sulfate  325 mg Oral BID WC  . ipratropium-albuterol  3 mL Nebulization Q6H  . letrozole  2.5 mg Oral Daily  . lidocaine  1 application Topical Once  . methylPREDNISolone (SOLU-MEDROL) injection  60 mg Intravenous Q24H  . metoprolol tartrate  12.5 mg Oral BID  . multivitamin with minerals  1 tablet Oral Daily  . protein supplement shake  11 oz Oral BID BM  . sodium chloride flush  3 mL Intravenous Q12H    Review of Systems: GENERAL:  Fatigue.  Fevers and sweats (on/off for < 1 year).  Fever curve slightly improved.  Weight loss (20 pounds in 3 months). PERFORMANCE STATUS (ECOG):  2 HEENT:  No visual changes, runny nose, sore throat, mouth sores or tenderness. Lungs: Denies shortness of breath.  Cough, productive of white phlegm with some blood post bronchoscopy.  Cardiac:  No chest pain, palpitations, orthopnea, or PND. GI: Loose stool.  No nausea, diarrhea, constipation, melena or hematochezia. GU:  No urgency, frequency, dysuria, or hematuria. Musculoskeletal:  No back pain.  No joint pain.  No muscle tenderness. Extremities:  No pain or swelling. Skin:  Rash on arm (chronic) s/p biopsy (lupus).  Denies photosensitivity.  No  ulcers. Neuro:  Headache come and goes.  No numbness or weakness, balance or coordination issues. Endocrine:  No diabetes, thyroid issues, hot flashes or night sweats. Psych:  No mood changes, depression or anxiety. Pain:  No focal pain. Review of systems:  All other systems reviewed and found to be negative  Physical Exam: Blood pressure 129/88, pulse 91, temperature 98.8 F (37.1 C), temperature source Oral, resp. rate 16, height _0  (1.499 m), weight 150 lb 2.1 oz (68.1 kg), SpO2 100 %.  GENERAL:  Fatigued appearing woman sitting comfortably on the medical unit in no acute distress. MENTAL STATUS:  Alert and oriented to person, place and time. HEAD:  Wearing a cap.  Normocephalic, atraumatic, face symmetric, no Cushingoid features. EYES:  Brown eyes.  Pupils equal round and reactive to light and accomodation.  No conjunctivitis or scleral icterus. ENT:  [place] in place.  Oropharynx clear without lesion.  Tongue normal.  Dentures.  Mucous membranes moist.  RESPIRATORY:  Lower lobe (left > right). No wheezes or rhonchi. CARDIOVASCULAR:  Regular rate and rhythm without murmur, rub or gallop. ABDOMEN:  Soft, non-tender, with active bowel sounds, and no appreciable hepatosplenomegaly.  No masses. SKIN:  Brown speckled upper extremities with slight erythema.  No ulcers or lesions. EXTREMITIES: No edema, no skin discoloration or tenderness.  No palpable cords. LYMPH NODES: Small right axillary adenopathy.  No palpable cervical, supraclavicular, or inguinal adenopathy  NEUROLOGICAL: Unremarkable. PSYCH:  Appropriate.   Results for orders placed or performed during the hospital encounter of 11/18/17 (from the  past 48 hour(s))  Prepare Pheresed Platelets     Status: None   Collection Time: 11/30/17  3:00 AM  Result Value Ref Range   Unit Number [id number]    Blood Component Type PLTP LR3 PAS    Unit division 00    Status of Unit ISSUED,FINAL    Transfusion Status      OK TO  TRANSFUSE Performed at [facility], [address] [place], [place] [address]   Basic metabolic panel     Status: Abnormal   Collection Time: 11/30/17 12:31 PM  Result Value Ref Range   Sodium 129 (L) 135 - 145 mmol/L   Potassium 3.4 (L) 3.5 - 5.1 mmol/L   Chloride 101 101 - 111 mmol/L   CO2 18 (L) 22 - 32 mmol/L   Glucose, Bld 136 (H) 65 - 99 mg/dL   BUN 56 (H) 6 - 20 mg/dL   Creatinine, Ser 2.20 (H) 0.44 - 1.00 mg/dL   Calcium 8.2 (L) 8.9 - 10.3 mg/dL   GFR calc non Af Amer 24 (L) >60 mL/min   GFR calc Af Amer 28 (L) >60 mL/min    Comment: (NOTE) The eGFR has been calculated using the CKD EPI equation. This calculation has not been validated in all clinical situations. eGFR's persistently <60 mL/min signify possible Chronic Kidney Disease.    Anion gap 10 5 - 15    Comment: Performed at [facility], [address] [place], [place] [address]  CBC with Differential     Status: Abnormal   Collection Time: 11/30/17 12:31 PM  Result Value Ref Range   WBC 2.0 (L) 3.6 - 11.0 K/uL   RBC 2.41 (L) 3.80 - 5.20 MIL/uL   Hemoglobin 6.9 (L) 12.0 - 16.0 g/dL   HCT 20.8 (L) 35.0 - 47.0 %   MCV 86.2 80.0 - 100.0 fL   MCH 28.6 26.0 - 34.0 pg   MCHC 33.2 32.0 - 36.0 g/dL   RDW 15.6 (H) 11.5 - 14.5 %   Platelets 108 (L) 150 - 440 K/uL   Neutrophils Relative % 71 %   Neutro Abs 1.5 1.4 - 6.5 K/uL   Lymphocytes Relative 21 %   Lymphs Abs 0.4 (L) 1.0 - 3.6 K/uL   Monocytes Relative 7 %   Monocytes Absolute 0.1 (L) 0.2 - 0.9 K/uL   Eosinophils Relative 0 %   Eosinophils Absolute 0.0 0 - 0.7 K/uL   Basophils Relative 1 %   Basophils Absolute 0.0 0 - 0.1 K/uL    Comment: Performed at [facility], [address] [place], [place] [address]  Haptoglobin     Status: None   Collection Time: 11/30/17 12:31 PM  Result Value Ref Range   Haptoglobin 188 34 - 200 mg/dL    Comment: (NOTE) Performed At: [facility] [place], [place]  [id number] Rush Farmer MD JG:[id number] Performed at [facility], [address] [place], [place] [address]   ABO/Rh     Status: None   Collection Time: 11/30/17 12:31 PM  Result Value Ref Range   ABO/RH(D)      B POS Performed at [facility], [address], [place], [place] [address]   Cytology - Non PAP; LEFT LOWER LOBE     Status: None   Collection Time: 11/30/17  1:48 PM  Result Value Ref Range   CYTOLOGY - NON GYN      Cytology - Non PAP CASE: [id number] PATIENT: Melanie  Holloway Non-Gyn Cytology Report     SPECIMEN SUBMITTED: A. Lung, left lower lobe; brushing  CLINICAL HISTORY: Cough, fever, hemoptypsis. SOB on O2, progressive ground-glass to nodular pulmonary process, pericardial effusion,  lymphadenopathy, history of stage III breast carcinoma in 2009, suspected bone metastases, diagnosis of lupus erythematosus  PRE-OPERATIVE DIAGNOSIS: None provided  POST-OPERATIVE DIAGNOSIS: None provided.     DIAGNOSIS: A. LUNG, LEFT LOWER LOBE; BRONCHOSCOPY BRUSHING: - NEGATIVE FOR MALIGNANCY. - BRONCHIAL EPITHELIAL CELLS, RARE SQUAMOUS EPITHELIAL CELLS, AND RARE MACROPHAGES.  Comment: Slides reviewed: 6 Diff-Quik stained slides and 1 ThinPrep slide   GROSS DESCRIPTION: A. Site: left lower lobe lung Procedure: bronchoscopy Cytotechnologist: Molli Barrows and Micheline Rough Specimen(s) collected: 6 Diff Quik stained slides Specimen labeled LLL brush :      Description:  clear CytoLyt solution with a brush      Submitted for:           ThinPrep  A forceps biopsy was obtained and will be reported in a separate ARS report. Final Diagnosis performed by Bryan Lemma, MD.  Electronically signed 12/01/2017 5:28:38PM    The electronic signature indicates that the named Attending Pathologist has evaluated the specimen  Technical component performed at [facility], [address], [place], [place] [address] Lab: [phone] Dir: Rush Farmer, MD, MMM  Professional component performed at [facility], [facility], [place], [place], [place] [address] Lab: [phone] Dir: Dellia Nims. Reuel Derby, MD    Cytology - Non PAP; PLEASE PERFORM DIFFERENTIAL COUNTS     Status: None   Collection Time: 11/30/17  1:48 PM  Result Value Ref Range   CYTOLOGY - NON GYN      Cytology - Non PAP CASE: [id number] PATIENT: Melanie Holloway Non-Gyn Cytology Report     SPECIMEN SUBMITTED: A. Lung, left lower lobe; lavage  CLINICAL HISTORY: Cough, fever, hemoptypsis. SOB on O2, progressive ground-glass to nodular pulmonary process, pericardial effusion,  lymphadenopathy, history of stage III breast carcinoma in 2009, suspected bone metastases, diagnosis of lupus erythematosus  PRE-OPERATIVE DIAGNOSIS: None provided  POST-OPERATIVE DIAGNOSIS: None provided.     DIAGNOSIS: A. LUNG, LEFT LOWER LOBE; BRONCHOALVEOLAR LAVAGE: - NEGATIVE FOR MALIGNANCY. - MACROPHAGES AND A FEW BRONCHIAL AND SQUAMOUS EPITHELIAL CELLS.  Comment: GMS stain will be reviewed to exclude fungi and Pneumocystis. The result will be reported in an addendum.  Slides reviewed: 1 ThinPrep and 1 cell block   GROSS DESCRIPTION: A. Site: left lower lobe lung Procedure: bronchoscopy Cytotechnologist: Molli Barrows and Micheline Rough Specimen(s) collected: Specime n labeled-LLL lavage:      Volume:  (for lavage only) 15 mL      Description: cloudy red fluid      Submitted for:           ThinPrep           Cell block(s): 1  A forceps biopsy was obtained and will be reported in a separate ARS report.  Final Diagnosis performed by Bryan Lemma, MD.  Electronically signed 12/01/2017 6:01:21PM    The electronic signature indicates that the named Attending Pathologist has evaluated the specimen  Technical component performed at [facility], [address] [place], [place] [address] Lab: [phone] Dir: Rush Farmer, MD,  MMM  Professional component performed at [facility], [facility], [place], [place], [place] [address] Lab: [phone] Dir: Dellia Nims. Reuel Derby, MD    Culture, bal-quantitative     Status: None (Preliminary result)   Collection Time: 11/30/17  2:50 PM  Result Value Ref  Range   Specimen Description      Bronch Lavag Performed at [facility], [address] [place], [place] [address]    Special Requests      Immunocompromised Performed at [facility], [place], [place] [address]    Gram Stain      RARE WBC PRESENT, PREDOMINANTLY PMN NO ORGANISMS SEEN    Culture      NO GROWTH < 24 HOURS Performed at [place] [address], [place], [place] [address]    Report Status PENDING   Culture, fungus without smear     Status: None (Preliminary result)   Collection Time: 11/30/17  2:50 PM  Result Value Ref Range   Specimen Description      BRONCHIAL ALVEOLAR LAVAGE Performed at [facility], [address], [place], [place] [address]    Special Requests      Immunocompromised Performed at [facility], [address], [place], [place] [address]    Culture      NO FUNGUS ISOLATED AFTER 1 DAY Performed at [facility], [place] [address] [place], [place] [address]    Report Status PENDING   Prepare RBC     Status: None   Collection Time: 11/30/17  3:00 PM  Result Value Ref Range   Order Confirmation      ORDER PROCESSED BY BLOOD BANK Performed at [facility], [address] [place], [place] [address]   CBC with Differential/Platelet     Status: Abnormal   Collection Time: 12/01/17  3:01 AM  Result Value Ref Range   WBC 3.4 (L) 3.6 - 11.0 K/uL   RBC 3.05 (L) 3.80 - 5.20 MIL/uL   Hemoglobin 9.1 (L) 12.0 - 16.0 g/dL    Comment: RESULT REPEATED AND VERIFIED   HCT 26.3 (L) 35.0 - 47.0 %   MCV 86.5 80.0 - 100.0 fL   MCH 30.0 26.0 - 34.0 pg   MCHC 34.7 32.0 - 36.0 g/dL    RDW 15.3 (H) 11.5 - 14.5 %   Platelets 117 (L) 150 - 440 K/uL   Neutrophils Relative % 86 %   Neutro Abs 2.9 1.4 - 6.5 K/uL   Lymphocytes Relative 11 %   Lymphs Abs 0.4 (L) 1.0 - 3.6 K/uL   Monocytes Relative 3 %   Monocytes Absolute 0.1 (L) 0.2 - 0.9 K/uL   Eosinophils Relative 0 %   Eosinophils Absolute 0.0 0 - 0.7 K/uL   Basophils Relative 0 %   Basophils Absolute 0.0 0 - 0.1 K/uL    Comment: Performed at [facility], [address] [place], [place] [address]  Basic metabolic panel     Status: Abnormal   Collection Time: 12/01/17  3:01 AM  Result Value Ref Range   Sodium 128 (L) 135 - 145 mmol/L   Potassium 3.8 3.5 - 5.1 mmol/L   Chloride 100 (L) 101 - 111 mmol/L   CO2 17 (L) 22 - 32 mmol/L   Glucose, Bld 165 (H) 65 - 99 mg/dL   BUN 62 (H) 6 - 20 mg/dL   Creatinine, Ser 2.35 (H) 0.44 - 1.00 mg/dL   Calcium 8.4 (L) 8.9 - 10.3 mg/dL   GFR calc non Af Amer 22 (L) >60 mL/min   GFR calc Af Amer 26 (L) >60 mL/min    Comment: (NOTE) The eGFR has been calculated using the CKD EPI equation. This calculation has not been validated in all  clinical situations. eGFR's persistently <60 mL/min signify possible Chronic Kidney Disease.    Anion gap 11 5 - 15    Comment: Performed at [facility], [address] [place], [place] [address]   Dg Chest 1 View  Result Date: 11/30/2017 CLINICAL DATA:  Post bronchoscopy chest x-ray. History of breast malignancy, anemia, and abnormal chest CT findings on November 29, 2017. EXAM: CHEST 1 VIEW COMPARISON:  Chest x-ray of November 26, 2017. FINDINGS: The lungs are adequately inflated. There is no pneumothorax. The interstitial markings are increased bilaterally and more conspicuous than on the previous study. The hemidiaphragms remain visible. The cardiac silhouette remains enlarged. The pulmonary vascularity is mildly engorged. The trachea is midline. There surgical clips in the left axillary region. IMPRESSION: No postprocedure  pneumothorax. Interstitial changes in the setting of cardiomegaly and pulmonary vascular congestion is consistent with mild CHF. Electronically Signed   By: David  Martinique M.D.   On: 11/30/2017 14:39   US Renal  Result Date: 12/01/2017 CLINICAL DATA:  Acute renal failure. EXAM: RENAL / URINARY TRACT ULTRASOUND COMPLETE COMPARISON:  CT abdomen pelvis 11/04/2017. FINDINGS: Right Kidney: Length: 9.4 cm. Echogenicity within normal limits. No mass or hydronephrosis visualized. Extrarenal pelvis is noted. Left Kidney: Length: 10.3 cm. Echogenicity within normal limits. No mass or hydronephrosis visualized. Bladder: Poorly distended. IMPRESSION: Normal exam. Electronically Signed   By: Lorin Picket M.D.   On: 12/01/2017 14:43   Dg C-arm 1-60 Min-no Report  Result Date: 11/30/2017 Fluoroscopy was utilized by the requesting physician.  No radiographic interpretation.    Assessment:  Shabnam Ladd is a 52 y.o. female with woman with a history of stage IIIA left breast cancer in 2009 s/p neoadjuvant chemotherapy followed by mastectomy, axillary node dissection, radiation and 5 years of hormonal therapy.  CA27.29 was normal on 09/13/2012  She was noted to have a right breast mass in 05/2017.  Biopsy of a right axillary lymph node on 05/21/2017 revealed reactive lymphoid hyperplasia.  Chest, abdomen, and pelvic CT on 11/04/2017 revealed enlarged right-sided intramammary lymph nodes and extensive right axillary and supraclavicular adenopathy. A discrete right breast mass  was not identified.  There was mediastinal and hilar lymphadenopathy.  There was a nodular interstitial process in the lungs which could be interstitial spread of tumor, severe bronchitis or interstitial pneumonitis.  There was stable abdominal and pelvic lymphadenopathy.  There was sclerotic bone metastasis in the manubrium and T8 vertebral body. There were lytic lesions in L2 and L5.  Bone scan on 11/04/2017 revealed abnormal radiotracer  activity in the right manubrium and T8 vertebral body compatible with the sclerotic metastatic disease.  There was no other osseous metastatic disease   Right supraclavicular biopsy on 11/22/2017 revealed no evidence of metastatic carcinoma.  There was no definitive evidence of a B cell lymphoma by morphology or flow cytometry.  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  She developed pancytopenia on 09/08/2017.  Normal studies in 08/2017 included: LDH, B12, folate, and ferritin.  Rheumatoid factor and TSH were normal on 03/07/2015.  ACE level was on 01/16/2013.  Hepatitis B surface antibody on 09/22/2017. Retic was 2.4% on 11/08/2017.  PT was 15 (INR 1.19) on 11/22/2017.  Patient has a warm autoantibody.  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  Positive studies include: ANA > 1:1280 (speckled) on 09/08/2017.  Beta2 microglobin 9.1 on 09/08/2017.   Echo on 11/11/2017 revealed an EF of 45-50% with a moderate to large pericardial effusion.  She has progressive renal insufficiency.  Renal ultrasound on 12/01/2017 was normal.  Symptomatically, she is fatigued, but overall feels better.  She has lost 20 pounds in 3 months.  She has had on/off drenching sweats x < 1 year.  She has had spiking temperatures x 7 days.    Plan:   1.  Oncology:  Stage IIIA left breast cancer s/p treatment.  Restaging studies in 10/2017 concerning for metastatic disease.  Supraclavicular biopsy on 11/22/2017 was negative.  Will need to pursue additional biopsy.  Consider PET scan directed biopsy.  Images reviewed with radiology.  Consider right axillary lymph node incisional biopsy.  Will review at tumor board tomorrow.  Differential includes lymphoma.  CA27.29 is 53.7 (slightly elevated).  2.  Hematology:  Patient with known beta-thalassemia.  Pancytopenia since 08/2017.  Patient has a high titer ANA (> 1:1280) and skin biopsy + lupus.  Patient may have a lupus anticoagulant  and/or consumption with fever/infection. Discussed with Dr. Jefm Bryant.  Patient has a warm autoantibody and thus may have autoimmune hemolytic anemia and autoimmune thrombocytopenia (ITP).  Patient started on Solumedrol 60 mg IV daily on 11/29/2017.  She received 1 units PRBCs yesterday.  Counts overall improved today.  Maintain active type and screen.  Transfuse leukopoor irradiated PRBC if hemoglobin < 7.0.   ACE level is normal.  Beta 2 glycoprotein IgG 30 (elevated).  Anti-cardiolipin IgM 16 (indeterminate).  Lupus anticoagulant panel negative.  Hepatitis B core antibody, hepatitis C antibody, HIV antibody are negative.  Uric acid is normal.  LDH is elevated.  3.  Infectious disease:  Patient with high spiking temperatures x 7 days.  Fever curve improved on steroids (Tmax 102.2 at 4:15 AM today).  Patient remains on broad spectrum antibiotics (Zosyn and azithromycin).   Extensive work-up underway (mycoplasma serology, Legionella, urine strept pneumonia, fungal serology, fungal culture, AFB, quantiferon gold, upper respiratory PCR, viral culture).  Possible tumor related fever.  Appreciate ID consult.  4.  Pulmonary:  Patient with nodular interstitial process in lungs of unclear etiology unclear.  Patient s/p bronchoscopy on 11/30/2017.  Await culture and pathology results.  5.  Renal:  Increasing creatinine.  Renal ultrasound negative.  Gental hydration initiated.  Appreciate nephrology consult.  6.  Rheumatology:  Consulted late today.  Additonal testing ordered per Dr Jefm Bryant (double stranded DNA, ENA, C3, C4).    Lequita Asal, MD  12/01/2017, 5:35 PM

## 2017-12-02 NOTE — Consult Note (Addendum)
Reason for Consult: Lupus  Referring Physician: Dr. Tawni Levy   HPI: Complicated story.  52 year old African-American female.  History of beta thalassemia.  History of breast cancer 3 a 2009.  Treated with chemotherapy surgery radiation and hormones.  In 2016 she had some hand joint pain elevated ANA at 10-1278.  Anti-DNA was negative.  Mild elevation of globulins.  Did have some proteinuria.  Referred to  nephrology.  Did not have biopsy as proteinuria was thought to be related to her hypertension.  Never went on any rheumatic meds.  Hand pain was thought to be related to trigger not injected  Last summer she developed a photosensitive rash.  Forearms.  She was seen by dermatology and had a biopsy consistent with atypical dermatitis or drug-induced lupus or photosensitive drug reaction.  She was treated with triamcinolone cream.  Rash would come and go.  Worse with sun exposure  Over the last several months she has developed worsening blood counts.  In 2016 her white count was 3500 but dropped to 2000.  Became more anemic. She was evaluated for possible recurrent breast cancer and had scan showing axillary and supraclavicular adenopathy.  She had biopsy that did not show malignancy.  She developed fever and shortness of breath and hemoptysis.  Was admitted.  CT showed interstitial lung disease.  Had bronchoscopy with no cultures abnormal.  She was placed on antibiotics and steroids.  Some improvement.  Still has an oxygen requirement though did not have fever last night.  She has not had any recurrent hemoptysis  Creatinine has been rising.  Initially 1 now 3.  Dipstick proteinuria but no significant red cells or white cells  PMH: Metastatic breast cancer.  Status post surgery radiation and chemotherapy and estrogen blockers.  Has bone lesions.  Recent elevation of tumor markers Hypertension Photosensitive skin rash Cytopenia  SURGICAL HISTORY: Axillary lymph node biopsy.   Mastectomy.  Family History: Negative for lupus or breast cancer  Social History: No cigarettes or alcohol  Allergies: No Known Allergies  Medications:  Continuous: . sodium chloride    . [START ON 12/03/2017] sodium chloride    . piperacillin-tazobactam (ZOSYN)  IV          ROS: As above.  CT did show pericardial effusion..  No recent chest pain.  Positive Coombs.  Did require transfusion   PHYSICAL EXAM: Blood pressure 130/88, pulse 95, temperature 98.4 F (36.9 C), temperature source Oral, resp. rate 18, height 4\' 11"  (1.499 m), weight 68.9 kg (151 lb 12.8 oz), SpO2 100 %. Pleasant female.  No acute distress.  Seen in bed.  Oxygen in place.  Skin with hyperpigmented lesions over the right ear and both forearms.  No telangiectasias.  Diffuse alopecia.  Sclera clear.  Chest with bilateral crackles.  No significant rub.  No visceromegaly.  1+ edema Musculoskeletal: Mild tenderness wrists MCPs on the right compared to the left.  Shoulders move well.  No knee effusions Neurologic: Symmetric power with neck flexors hip flexors and bilateral grip.  Assessment: Meets criteria for systemic lupus.  Photosensitive skin rash.  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  Pericardial effusion.  History of joint pain.  Low complements.  Positive ANA.  Prior positive RNP.  Low titer anticardiolipin antibody.  Pending anti-DNA.  Recent pulmonary infiltrates with fever and hemoptysis.  May or may not be related to the above  Metastatic breast cancer with bony lesions and adenopathy but no evidence on pathology of recurrent  breast and the axillary nodes  Renal insufficiency.  Did have proteinuria in 2016 but not significantly since.  Now with worsening renal function rule out ATN or diarrhea related or  nephritis from her lupus  Recommendations: Agree with renal biopsy.  Agree with current steroids.  Depending on biopsy would decide about second agent.  Cytoxan versus  mycophenolate versus Imuran.  Pending anti-DNA previously negative  Emmaline Kluver 12/02/2017, 11:55 AM

## 2017-12-02 NOTE — Progress Notes (Signed)
[facility]  ROUNDING NOTE   Subjective:  Renal function unfortunately continues to deteriorate. Creatinine is down to 3.06. Her complement levels were also quite low both C3 and C4. This suggests ongoing lupus activity.   Objective:  Vital signs in last 24 hours:  Temp:  [98.4 F (36.9 C)-98.8 F (37.1 C)] 98.4 F (36.9 C) (02/14 0401) Pulse Rate:  [90-104] 95 (02/14 0401) Resp:  [16-24] 18 (02/14 0401) BP: (125-142)/(80-93) 130/88 (02/14 0401) SpO2:  [100 %] 100 % (02/14 0401) Weight:  [68.9 kg (151 lb 12.8 oz)] 68.9 kg (151 lb 12.8 oz) (02/14 0500)  Weight change: 0.756 kg (1 lb 10.7 oz) Filed Weights   11/30/17 0500 12/01/17 0413 12/02/17 0500  Weight: 62.8 kg (138 lb 7.2 oz) 68.1 kg (150 lb 2.1 oz) 68.9 kg (151 lb 12.8 oz)    Intake/Output: I/O last 3 completed shifts: In: 034 [Blood:404; IV Piggyback:250] Out: 500 [Urine:500]   Intake/Output this shift:  No intake/output data recorded.  Physical Exam: General: No acute distress  Head: Normocephalic, atraumatic. Moist oral mucosal membranes  Eyes: Anicteric  Neck: Supple, trachea midline  Lungs:  Scattered rhonchi, normal effort  Heart: S1S2 no rubs  Abdomen:  Soft, nontender, bowel sounds present  Extremities: trace peripheral edema.  Neurologic: Awake, alert, following commands  Skin: No lesions       Basic Metabolic Panel: Recent Labs  Lab 11/28/17 0819 11/29/17 0306 11/30/17 1231 12/01/17 0301 12/02/17 0602  NA 128* 129* 129* 128* 131*  K 3.9 3.9 3.4* 3.8 3.9  CL 101 100* 101 100* 102  CO2 19* 19* 18* 17* 18*  GLUCOSE 93 107* 136* 165* 158*  BUN 46* 45* 56* 62* 75*  CREATININE 1.79* 1.70* 2.20* 2.35* 3.06*  CALCIUM 8.0* 8.3* 8.2* 8.4* 8.5*    Liver Function Tests: No results for input(s): AST, ALT, ALKPHOS, BILITOT, PROT, ALBUMIN in the last 168 hours. No results for input(s): LIPASE, AMYLASE in the last 168 hours. No results for input(s): AMMONIA in the last 168  hours.  CBC: Recent Labs  Lab 11/28/17 0819 11/29/17 0306 11/30/17 1231 12/01/17 0301 12/02/17 0602  WBC 3.0* 3.8 2.0* 3.4* 3.0*  NEUTROABS 2.0 2.6 1.5 2.9 2.5  HGB 7.2* 7.7* 6.9* 9.1* 9.0*  HCT 21.4* 23.0* 20.8* 26.3* 25.9*  MCV 86.5 87.6 86.2 86.5 87.0  PLT 68* 76* 108* 117* 99*    Cardiac Enzymes: No results for input(s): CKTOTAL, CKMB, CKMBINDEX, TROPONINI in the last 168 hours.  BNP: Invalid input(s): POCBNP  CBG: No results for input(s): GLUCAP in the last 168 hours.  Microbiology: Results for orders placed or performed during the hospital encounter of 11/18/17  MRSA PCR Screening     Status: None   Collection Time: 11/18/17  9:50 PM  Result Value Ref Range Status   MRSA by PCR NEGATIVE NEGATIVE Final    Comment:        The GeneXpert MRSA Assay (FDA approved for NASAL specimens only), is one component of a comprehensive MRSA colonization surveillance program. It is not intended to diagnose MRSA infection nor to guide or monitor treatment for MRSA infections. Performed at [facility], [address] [place], [place] [address]   MRSA PCR Screening     Status: None   Collection Time: 11/23/17  5:41 PM  Result Value Ref Range Status   MRSA by PCR NEGATIVE NEGATIVE Final    Comment:        The GeneXpert MRSA Assay (FDA approved  for NASAL specimens only), is one component of a comprehensive MRSA colonization surveillance program. It is not intended to diagnose MRSA infection nor to guide or monitor treatment for MRSA infections. Performed at [facility], [address] [place], [place] [address]   Gastrointestinal Panel by PCR , Stool     Status: None   Collection Time: 11/26/17  8:12 AM  Result Value Ref Range Status   Campylobacter species NOT DETECTED NOT DETECTED Final   Plesimonas shigelloides NOT DETECTED NOT DETECTED Final   Salmonella species NOT DETECTED NOT DETECTED Final   Yersinia enterocolitica NOT DETECTED NOT  DETECTED Final   Vibrio species NOT DETECTED NOT DETECTED Final   Vibrio cholerae NOT DETECTED NOT DETECTED Final   Enteroaggregative E coli (EAEC) NOT DETECTED NOT DETECTED Final   Enteropathogenic E coli (EPEC) NOT DETECTED NOT DETECTED Final   Enterotoxigenic E coli (ETEC) NOT DETECTED NOT DETECTED Final   Shiga like toxin producing E coli (STEC) NOT DETECTED NOT DETECTED Final   Shigella/Enteroinvasive E coli (EIEC) NOT DETECTED NOT DETECTED Final   Cryptosporidium NOT DETECTED NOT DETECTED Final   Cyclospora cayetanensis NOT DETECTED NOT DETECTED Final   Entamoeba histolytica NOT DETECTED NOT DETECTED Final   Giardia lamblia NOT DETECTED NOT DETECTED Final   Adenovirus F40/41 NOT DETECTED NOT DETECTED Final   Astrovirus NOT DETECTED NOT DETECTED Final   Norovirus GI/GII NOT DETECTED NOT DETECTED Final   Rotavirus A NOT DETECTED NOT DETECTED Final   Sapovirus (I, II, IV, and V) NOT DETECTED NOT DETECTED Final    Comment: Performed at [facility], [address] [place], [place] [address]  C difficile quick scan w PCR reflex     Status: None   Collection Time: 11/26/17  8:12 AM  Result Value Ref Range Status   C Diff antigen NEGATIVE NEGATIVE Final   C Diff toxin NEGATIVE NEGATIVE Final   C Diff interpretation No C. difficile detected.  Final    Comment: Performed at [facility], [address] [place], [place] [address]  CULTURE, BLOOD (ROUTINE X 2) w Reflex to ID Panel     Status: None   Collection Time: 11/27/17  8:58 AM  Result Value Ref Range Status   Specimen Description BLOOD RIGHT ANTECUBITAL  Final   Special Requests   Final    BOTTLES DRAWN AEROBIC AND ANAEROBIC Blood Culture adequate volume   Culture   Final    NO GROWTH 5 DAYS Performed at [facility], [address] [place], [place] [address]    Report Status 12/02/2017 FINAL  Final  CULTURE, BLOOD (ROUTINE X 2) w Reflex to ID Panel     Status: None   Collection Time: 11/27/17  11:03 AM  Result Value Ref Range Status   Specimen Description BLOOD RIGHT ANTECUBITAL  Final   Special Requests   Final    BOTTLES DRAWN AEROBIC AND ANAEROBIC Blood Culture adequate volume   Culture   Final    NO GROWTH 5 DAYS Performed at [facility], [address] [place], [place] [address]    Report Status 12/02/2017 FINAL  Final  Respiratory Panel by PCR     Status: None   Collection Time: 11/29/17  5:30 PM  Result Value Ref Range Status   Adenovirus NOT DETECTED NOT DETECTED Final   Coronavirus 229E NOT DETECTED NOT DETECTED Final   Coronavirus HKU1 NOT DETECTED NOT DETECTED Final   Coronavirus NL63 NOT DETECTED NOT DETECTED Final  Coronavirus OC43 NOT DETECTED NOT DETECTED Final   Metapneumovirus NOT DETECTED NOT DETECTED Final   Rhinovirus / Enterovirus NOT DETECTED NOT DETECTED Final   Influenza A NOT DETECTED NOT DETECTED Final   Influenza A H1 NOT DETECTED NOT DETECTED Final   Influenza A H1 2009 NOT DETECTED NOT DETECTED Final   Influenza A H3 NOT DETECTED NOT DETECTED Final   Influenza B NOT DETECTED NOT DETECTED Final   Parainfluenza Virus 1 NOT DETECTED NOT DETECTED Final   Parainfluenza Virus 2 NOT DETECTED NOT DETECTED Final   Parainfluenza Virus 3 NOT DETECTED NOT DETECTED Final   Parainfluenza Virus 4 NOT DETECTED NOT DETECTED Final   Respiratory Syncytial Virus NOT DETECTED NOT DETECTED Final   Bordetella pertussis NOT DETECTED NOT DETECTED Final   Chlamydophila pneumoniae NOT DETECTED NOT DETECTED Final   Mycoplasma pneumoniae NOT DETECTED NOT DETECTED Final    Comment: Performed at [facility], [place] [address] [place], [place] [address]  Culture, expectorated sputum-assessment     Status: None   Collection Time: 11/29/17  9:35 PM  Result Value Ref Range Status   Specimen Description SPUTUM  Final   Special Requests NONE  Final   Sputum evaluation   Final    THIS SPECIMEN IS ACCEPTABLE FOR SPUTUM CULTURE Performed at [facility], [address] [place], [place] [address]    Report Status 11/30/2017 FINAL  Final  Culture, respiratory (NON-Expectorated)     Status: None (Preliminary result)   Collection Time: 11/29/17  9:35 PM  Result Value Ref Range Status   Specimen Description   Final    SPUTUM Performed at [facility], [address], [place], [place] [address]    Special Requests   Final    NONE Reflexed from [id number] Performed at [facility], [address] [place], [place] [address]    Gram Stain   Final    RARE WBC PRESENT, PREDOMINANTLY PMN FEW YEAST RARE GRAM POSITIVE COCCI IN PAIRS    Culture   Final    CULTURE REINCUBATED FOR BETTER GROWTH Performed at [facility], [place] [address] [place], [place] [address]    Report Status PENDING  Incomplete  Culture, bal-quantitative     Status: None (Preliminary result)   Collection Time: 11/30/17  2:50 PM  Result Value Ref Range Status   Specimen Description   Final    Bronch Lavag Performed at [facility], [address] [place], [place] [address]    Special Requests   Final    Immunocompromised Performed at [facility], [address] [place], [place] [address]    Gram Stain   Final    RARE WBC PRESENT, PREDOMINANTLY PMN NO ORGANISMS SEEN    Culture   Final    NO GROWTH < 24 HOURS Performed at [facility], [place] [address] [place], [place] [address]    Report Status PENDING  Incomplete  Acid Fast Smear (AFB)     Status: None   Collection Time: 11/30/17  2:50 PM  Result Value Ref Range Status   AFB Specimen Processing Concentration  Final   Acid Fast Smear Negative  Final    Comment: (NOTE) Performed At: [facility] [place], [place] [id number] Rush Farmer MD YB:[id number]    Source (AFB) BRONCHIAL ALVEOLAR LAVAGE  Final    Comment: Performed at [facility], [address] [place], [place] [address]  Culture, fungus without  smear      Status: None (Preliminary result)   Collection Time: 11/30/17  2:50 PM  Result Value Ref Range Status   Specimen Description   Final    BRONCHIAL ALVEOLAR LAVAGE Performed at [facility], [address], [place], [place] [address]    Special Requests   Final    Immunocompromised Performed at [facility], [address] [place], [place] [address]    Culture   Final    NO FUNGUS ISOLATED AFTER 1 DAY Performed at [facility], [place] [address] [place], [place] [address]    Report Status PENDING  Incomplete    Coagulation Studies: No results for input(s): LABPROT, INR in the last 72 hours.  Urinalysis: No results for input(s): COLORURINE, LABSPEC, PHURINE, GLUCOSEU, HGBUR, BILIRUBINUR, KETONESUR, PROTEINUR, UROBILINOGEN, NITRITE, LEUKOCYTESUR in the last 72 hours.  Invalid input(s): APPERANCEUR    Imaging: Dg Chest 1 View  Result Date: 11/30/2017 CLINICAL DATA:  Post bronchoscopy chest x-ray. History of breast malignancy, anemia, and abnormal chest CT findings on November 29, 2017. EXAM: CHEST 1 VIEW COMPARISON:  Chest x-ray of November 26, 2017. FINDINGS: The lungs are adequately inflated. There is no pneumothorax. The interstitial markings are increased bilaterally and more conspicuous than on the previous study. The hemidiaphragms remain visible. The cardiac silhouette remains enlarged. The pulmonary vascularity is mildly engorged. The trachea is midline. There surgical clips in the left axillary region. IMPRESSION: No postprocedure pneumothorax. Interstitial changes in the setting of cardiomegaly and pulmonary vascular congestion is consistent with mild CHF. Electronically Signed   By: David  Martinique M.D.   On: 11/30/2017 14:39   US Renal  Result Date: 12/01/2017 CLINICAL DATA:  Acute renal failure. EXAM: RENAL / URINARY TRACT ULTRASOUND COMPLETE COMPARISON:  CT abdomen pelvis 11/04/2017. FINDINGS: Right Kidney: Length: 9.4 cm. Echogenicity within normal  limits. No mass or hydronephrosis visualized. Extrarenal pelvis is noted. Left Kidney: Length: 10.3 cm. Echogenicity within normal limits. No mass or hydronephrosis visualized. Bladder: Poorly distended. IMPRESSION: Normal exam. Electronically Signed   By: Lorin Picket M.D.   On: 12/01/2017 14:43   Dg C-arm 1-60 Min-no Report  Result Date: 11/30/2017 Fluoroscopy was utilized by the requesting physician.  No radiographic interpretation.     Medications:   . sodium chloride    . piperacillin-tazobactam (ZOSYN)  IV     . budesonide (PULMICORT) nebulizer solution  0.5 mg Nebulization BID  . butamben-tetracaine-benzocaine  1 spray Topical Once  . cholecalciferol  2,000 Units Oral Daily  . ferrous sulfate  325 mg Oral BID WC  . ipratropium-albuterol  3 mL Nebulization Q6H  . letrozole  2.5 mg Oral Daily  . lidocaine  1 application Topical Once  . methylPREDNISolone (SOLU-MEDROL) injection  60 mg Intravenous Q24H  . metoprolol tartrate  12.5 mg Oral BID  . multivitamin with minerals  1 tablet Oral Daily  . protein supplement shake  11 oz Oral BID BM  . sodium chloride flush  3 mL Intravenous Q12H   sodium chloride, acetaminophen, albuterol, ALPRAZolam, alum & mag hydroxide-simeth, benzonatate, guaiFENesin, ondansetron (ZOFRAN) IV, phenylephrine, sodium chloride flush, triamcinolone cream  Assessment/ Plan:  52 y.o. female with hypertension, anemia, vitamin D deficiency, breast cancer status post left mastectomy, chemotherapy and radiation   1.  Acute renal failure with chronic kidney disease stage III with history of proteinuria, hematuria, and history of positive ANA.  Renal function unfortunately continues to deteriorate.  Creatinine is up to 3.06 with a BUN  of 75.  She also has very low complement levels which suggest underlying lupus activity.  We have sent off additional serologic workup however this is not yet returned.  However at this time we recommend renal biopsy.  We discussed  risks, benefits, and alternatives.  Patient has agreed to have this performed tomorrow.  We will send off further appropriate labs today and make the patient n.p.o. after midnight.  2.  Hypertension.  Blood pressure currently well controlled at 130/88 and is acceptable for renal biopsy.  Maintain the patient on metoprolol 12.5 mg p.o. twice daily.   LOS: [place], Abdirahman Chittum 2/14/201911:29 AM

## 2017-12-02 NOTE — Progress Notes (Signed)
Pharmacy Antibiotic Note  Melanie Holloway is a 52 y.o. female admitted on 11/18/2017 with pneumonia.  Pharmacy has been consulted for piperacillin/tazobactam dosing.  This is day #10 of IV antibiotics.  Plan: Renal function declines so that CrCl less than 20 mL/min. Adjust Zosyn to 3.375 gm IV Q12H EI. Azithromycin 500 mg po x 5 days last dose given today.   Height: 4\' 11"  (149.9 cm) Weight: 151 lb 12.8 oz (68.9 kg) IBW/kg (Calculated) : 43.2  Temp (24hrs), Avg:98.5 F (36.9 C), Min:98.4 F (36.9 C), Max:98.8 F (37.1 C)  Recent Labs  Lab 11/28/17 0811 11/28/17 0819 11/29/17 0306 11/30/17 1231 12/01/17 0301 12/02/17 0602  WBC  --  3.0* 3.8 2.0* 3.4* 3.0*  CREATININE  --  1.79* 1.70* 2.20* 2.35* 3.06*  LATICACIDVEN 0.8  --   --   --   --   --     Estimated Creatinine Clearance: 18 mL/min (A) (by C-G formula based on SCr of 3.06 mg/dL (H)).    No Known Allergies  Antimicrobials this admission: Piperacillin/tazobactam 2/5 >>  vancomycin 2/5 >> 2/7  Dose adjustments this admission:  Microbiology results: Thank you for allowing pharmacy to be a part of this patient's care.  Laural Benes, PharmD, BCPS Clinical Pharmacist 12/02/2017 11:15 AM

## 2017-12-02 NOTE — Progress Notes (Signed)
[facility] Hematology/Oncology Progress Note  Date of admission: 11/18/2017  Hospital day:  12/02/2017   Chief Complaint: Melanie Holloway is a 52 y.o. female with a history of stage IIIA left breast cancer who was admitted through the emergency room with shortness of breath and acute respiratory failure.  Subjective:  Cough productive of some sputum and blood.  Some diarrhea.  Last fever (102.3) on 04/13 at 4:23 AM.  Feels nervous about procedure scheduled for tomorrow.  Social History: The patient is accompanied by her nurse today.  Allergies: No Known Allergies  Scheduled Medications: . budesonide (PULMICORT) nebulizer solution  0.5 mg Nebulization BID  . butamben-tetracaine-benzocaine  1 spray Topical Once  . cholecalciferol  2,000 Units Oral Daily  . ferrous sulfate  325 mg Oral BID WC  . ipratropium-albuterol  3 mL Nebulization Q6H  . letrozole  2.5 mg Oral Daily  . lidocaine  1 application Topical Once  . methylPREDNISolone (SOLU-MEDROL) injection  60 mg Intravenous Q24H  . metoprolol tartrate  12.5 mg Oral BID  . multivitamin with minerals  1 tablet Oral Daily  . protein supplement shake  11 oz Oral BID BM  . sodium chloride flush  3 mL Intravenous Q12H    Review of Systems: GENERAL:  Fatigue.  Fevers and sweats (on/off for < 1 year).  Fever curve improved.  Weight loss (20 pounds in 3 months). PERFORMANCE STATUS (ECOG):  2 HEENT:  No visual changes, runny nose, sore throat, mouth sores or tenderness. Lungs: Shortness of breath.  Cough, productive of phlegm with some blood post bronchoscopy.  Cardiac:  No chest pain, palpitations, orthopnea, or PND. GI: Loose stool.  No nausea, diarrhea, constipation, melena or hematochezia. GU:  No urgency, frequency, dysuria, or hematuria. Musculoskeletal:  Joint pain (hands).  No back pain.  No muscle tenderness. Extremities:  No pain or swelling. Skin:  Rash on arm (chronic) s/p biopsy (lupus).  Photosensitivity.  No  ulcers. Neuro:  Headache come and goes.  No numbness or weakness, balance or coordination issues. Endocrine:  No diabetes, thyroid issues, hot flashes or night sweats. Psych:  Anxious/nervous about upcoming biopsy.  No depression. Pain:  No focal pain. Review of systems:  All other systems reviewed and found to be negative  Physical Exam: Blood pressure (!) 148/95, pulse (!) 101, temperature 98.5 F (36.9 C), temperature source Oral, resp. rate 20, height 4' 11"  (1.499 m), weight 151 lb 12.8 oz (68.9 kg), SpO2 99 %.  GENERAL:  Fatigued appearing woman sitting comfortably on the medical unit in no acute distress. MENTAL STATUS:  Alert and oriented to person, place and time. HEAD:  Wearing a cap.  Normocephalic, atraumatic, face symmetric, no Cushingoid features. EYES:  Brown eyes.  Pupils equal round and reactive to light and accomodation.  No conjunctivitis or scleral icterus. ENT:  [place] in place.  Oropharynx clear without lesion.  Tongue normal.  Dentures.  Mucous membranes dry.  RESPIRATORY:  Lower lobe (left > right) crackles. No wheezes or rhonchi. CARDIOVASCULAR:  Regular rate and rhythm without murmur, rub or gallop. ABDOMEN:  Soft, non-tender, with active bowel sounds, and no appreciable hepatosplenomegaly.  No masses. SKIN:  Brown speckled upper extremities with slight erythema.  No ulcers or lesions. EXTREMITIES: No edema, no skin discoloration or tenderness.  No palpable cords. LYMPH NODES: Small right axillary adenopathy.  No palpable cervical, supraclavicular, or inguinal adenopathy  NEUROLOGICAL: Unremarkable. PSYCH:  Appropriate.   Results for orders placed or performed during the hospital  encounter of 11/18/17 (from the past 48 hour(s))  CBC with Differential/Platelet     Status: Abnormal   Collection Time: 12/01/17  3:01 AM  Result Value Ref Range   WBC 3.4 (L) 3.6 - 11.0 K/uL   RBC 3.05 (L) 3.80 - 5.20 MIL/uL   Hemoglobin 9.1 (L) 12.0 - 16.0 g/dL    Comment: RESULT  REPEATED AND VERIFIED   HCT 26.3 (L) 35.0 - 47.0 %   MCV 86.5 80.0 - 100.0 fL   MCH 30.0 26.0 - 34.0 pg   MCHC 34.7 32.0 - 36.0 g/dL   RDW 15.3 (H) 11.5 - 14.5 %   Platelets 117 (L) 150 - 440 K/uL   Neutrophils Relative % 86 %   Neutro Abs 2.9 1.4 - 6.5 K/uL   Lymphocytes Relative 11 %   Lymphs Abs 0.4 (L) 1.0 - 3.6 K/uL   Monocytes Relative 3 %   Monocytes Absolute 0.1 (L) 0.2 - 0.9 K/uL   Eosinophils Relative 0 %   Eosinophils Absolute 0.0 0 - 0.7 K/uL   Basophils Relative 0 %   Basophils Absolute 0.0 0 - 0.1 K/uL    Comment: Performed at [facility], [address] [place], [place] [address]  Basic metabolic panel     Status: Abnormal   Collection Time: 12/01/17  3:01 AM  Result Value Ref Range   Sodium 128 (L) 135 - 145 mmol/L   Potassium 3.8 3.5 - 5.1 mmol/L   Chloride 100 (L) 101 - 111 mmol/L   CO2 17 (L) 22 - 32 mmol/L   Glucose, Bld 165 (H) 65 - 99 mg/dL   BUN 62 (H) 6 - 20 mg/dL   Creatinine, Ser 2.35 (H) 0.44 - 1.00 mg/dL   Calcium 8.4 (L) 8.9 - 10.3 mg/dL   GFR calc non Af Amer 22 (L) >60 mL/min   GFR calc Af Amer 26 (L) >60 mL/min    Comment: (NOTE) The eGFR has been calculated using the CKD EPI equation. This calculation has not been validated in all clinical situations. eGFR's persistently <60 mL/min signify possible Chronic Kidney Disease.    Anion gap 11 5 - 15    Comment: Performed at [facility], [address] [place], [place] [address]  C3 complement     Status: Abnormal   Collection Time: 12/01/17 12:46 PM  Result Value Ref Range   C3 Complement 21 (L) 82 - 167 mg/dL    Comment: (NOTE) Performed At: [facility] [place], [place] [id number] Rush Farmer MD [id number] Performed at [facility], [address] [place], [place] [address]   C4 complement     Status: Abnormal   Collection Time: 12/01/17 12:46 PM  Result Value Ref Range   Complement C4, Body Fluid 4 (L) 14 - 44 mg/dL     Comment: (NOTE) Performed At: [facility] [place], [place] [id number] Rush Farmer MD [id number] Performed at [facility], [address] [place], [place] [address]   ANA w/Reflex if Positive     Status: Abnormal   Collection Time: 12/01/17 12:46 PM  Result Value Ref Range   Anit Nuclear Antibody(ANA) Positive (A) Negative    Comment: (NOTE) Performed At: [facility] [place], [place] [id number] Rush Farmer MD [id number] Performed at [facility], [place], [place] [address]   Anti-DNA antibody, double-stranded     Status: None   Collection Time: 12/01/17 12:46 PM  Result Value Ref Range   ds DNA Ab 2 0 - 9 IU/mL    Comment: (NOTE)                                   Negative      <5                                   Equivocal  5 - 9                                   Positive      >9 Performed At: [facility] [place], [place] [id number] Rush Farmer MD [id number] Performed at [facility], [address] [place], [place] [address]   Basic metabolic panel     Status: Abnormal   Collection Time: 12/02/17  6:02 AM  Result Value Ref Range   Sodium 131 (L) 135 - 145 mmol/L   Potassium 3.9 3.5 - 5.1 mmol/L   Chloride 102 101 - 111 mmol/L   CO2 18 (L) 22 - 32 mmol/L   Glucose, Bld 158 (H) 65 - 99 mg/dL   BUN 75 (H) 6 - 20 mg/dL   Creatinine, Ser 3.06 (H) 0.44 - 1.00 mg/dL   Calcium 8.5 (L) 8.9 - 10.3 mg/dL   GFR calc non Af Amer 16 (L) >60 mL/min   GFR calc Af Amer 19 (L) >60 mL/min    Comment: (NOTE) The eGFR has been calculated using the CKD EPI equation. This calculation has not been validated in all clinical situations. eGFR's persistently <60 mL/min signify possible Chronic Kidney Disease.    Anion gap 11 5 - 15    Comment: Performed at [facility], [address] [place], [place] [address]  CBC with Differential      Status: Abnormal   Collection Time: 12/02/17  6:02 AM  Result Value Ref Range   WBC 3.0 (L) 3.6 - 11.0 K/uL   RBC 2.98 (L) 3.80 - 5.20 MIL/uL   Hemoglobin 9.0 (L) 12.0 - 16.0 g/dL   HCT 25.9 (L) 35.0 - 47.0 %   MCV 87.0 80.0 - 100.0 fL   MCH 30.3 26.0 - 34.0 pg   MCHC 34.8 32.0 - 36.0 g/dL   RDW 15.4 (H) 11.5 - 14.5 %   Platelets 99 (L) 150 - 440 K/uL   Neutrophils Relative % 83 %   Neutro Abs 2.5 1.4 - 6.5 K/uL   Lymphocytes Relative 10 %   Lymphs Abs 0.3 (L) 1.0 - 3.6 K/uL   Monocytes Relative 4 %   Monocytes Absolute 0.1 (L) 0.2 - 0.9 K/uL   Eosinophils Relative 2 %   Eosinophils Absolute 0.1 0 - 0.7 K/uL   Basophils Relative 1 %   Basophils Absolute 0.0 0 - 0.1 K/uL    Comment: Performed at [facility], [address] [place], [place] [address]  Type and screen [place]     Status: None   Collection Time: 12/02/17 11:42 AM  Result Value Ref Range   ABO/RH(D) B POS    Antibody Screen NEG    Sample Expiration      12/05/2017 Performed at [facility], [address] [place], [place] [address]  Protime-INR     Status: None   Collection Time: 12/02/17 11:50 AM  Result Value Ref Range   Prothrombin Time 13.8 11.4 - 15.2 seconds   INR 1.07     Comment: Performed at [facility], [address] [place], [place] [address]  CK     Status: Abnormal   Collection Time: 12/02/17  2:28 PM  Result Value Ref Range   Total CK 24 (L) 38 - 234 U/L    Comment: Performed at [facility], [address] [place], [place] [address]  Hepatic function panel     Status: Abnormal   Collection Time: 12/02/17  2:28 PM  Result Value Ref Range   Total Protein 7.1 6.5 - 8.1 g/dL   Albumin 2.2 (L) 3.5 - 5.0 g/dL   AST 54 (H) 15 - 41 U/L   ALT 30 14 - 54 U/L   Alkaline Phosphatase 37 (L) 38 - 126 U/L   Total Bilirubin 0.9 0.3 - 1.2 mg/dL   Bilirubin, Direct 0.1 0.1 - 0.5 mg/dL   Indirect Bilirubin 0.8 0.3 - 0.9 mg/dL     Comment: Performed at [facility], [address] [place], [place] [address]  Sedimentation rate     Status: Abnormal   Collection Time: 12/02/17  2:28 PM  Result Value Ref Range   Sed Rate 129 (H) 0 - 30 mm/hr    Comment: Performed at [facility], [address] [place], [place] [address]   US Renal  Result Date: 12/01/2017 CLINICAL DATA:  Acute renal failure. EXAM: RENAL / URINARY TRACT ULTRASOUND COMPLETE COMPARISON:  CT abdomen pelvis 11/04/2017. FINDINGS: Right Kidney: Length: 9.4 cm. Echogenicity within normal limits. No mass or hydronephrosis visualized. Extrarenal pelvis is noted. Left Kidney: Length: 10.3 cm. Echogenicity within normal limits. No mass or hydronephrosis visualized. Bladder: Poorly distended. IMPRESSION: Normal exam. Electronically Signed   By: Lorin Picket M.D.   On: 12/01/2017 14:43    Assessment:  Melanie Holloway is a 52 y.o. female with woman with a history of stage IIIA left breast cancer in 2009 s/p neoadjuvant chemotherapy followed by mastectomy, axillary node dissection, radiation and 5 years of hormonal therapy.  CA27.29 was normal on 09/13/2012  She was noted to have a right breast mass in 05/2017.  Biopsy of a right axillary lymph node on 05/21/2017 revealed reactive lymphoid hyperplasia.  Chest, abdomen, and pelvic CT on 11/04/2017 revealed enlarged right-sided intramammary lymph nodes and extensive right axillary and supraclavicular adenopathy. A discrete right breast mass  was not identified.  There was mediastinal and hilar lymphadenopathy.  There was a nodular interstitial process in the lungs which could be interstitial spread of tumor, severe bronchitis or interstitial pneumonitis.  There was stable abdominal and pelvic lymphadenopathy.  There was sclerotic bone metastasis in the manubrium and T8 vertebral body. There were lytic lesions in L2 and L5.  Bone scan on 11/04/2017 revealed abnormal radiotracer activity in the right  manubrium and T8 vertebral body compatible with the sclerotic metastatic disease.  There was no other osseous metastatic disease   Right supraclavicular biopsy on 11/22/2017 revealed no evidence of metastatic carcinoma.  There was no definitive evidence of a B cell lymphoma by morphology or flow cytometry.  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  She developed pancytopenia on 09/08/2017.  Normal studies in 08/2017 included: LDH, B12, folate, and ferritin.  Rheumatoid factor and TSH were normal on 03/07/2015.  ACE level was on 01/16/2013.  Hepatitis  B surface antibody on 09/22/2017. Retic was 2.4% on 11/08/2017.  PT was 15 (INR 1.19) on 11/22/2017.  Patient has a warm autoantibody.  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  Positive studies include: ANA > 1:1280 (speckled) on 09/08/2017.  Beta2 microglobin 9.1 on 09/08/2017.   Echo on 11/11/2017 revealed an EF of 45-50% with a moderate to large pericardial effusion.   She has progressive renal insufficiency.  Renal ultrasound on 12/01/2017 was normal.  Symptomatically, she remains fatigued.  She has lost 20 pounds in 3 months.  She has had on/off drenching sweats x < 1 year.  She has had spiking temperatures x 7 days.    Plan:   1.  Oncology:  Stage IIIA left breast cancer s/p treatment.  Restaging studies in 10/2017 concerning for metastatic disease.  Supraclavicular biopsy on 11/22/2017 was negative.  Will need to pursue additional biopsy.  Consider right axillary lymph node incisional biopsy.  Reviewed at tumor board today.  Discuss plan for axillary biopsy in future, but will postpone (readdress next week or after discharge) given current issues as treatment for malignancy would be postponed at this time.  CA27.29 is 53.7 (slightly elevated).  2.  Hematology:  Patient with beta-thalassemia.  Pancytopenia since 08/2017 felt possibly secondary to SLE  Patient has a high titer ANA (> 1:1280) and skin  biopsy + lupus.  Patient has a negative lupus anticoagulant panel, but low titer beta 2 glycoprotein and anticardiolipin antibody. Some component of consumption with fever/infection.   Patient has a warm autoantibody and thus may have  autoimmune hemolytic anemia and autoimmune thrombocytopenia (ITP).  Patient began Solumedrol 60 mg IV daily on 11/29/2017.  She received 1 units PRBCs on 11/30/2017.  Counts stable.  Maintain active type and screen.  Transfuse leukopoor irradiated PRBC if hemoglobin < 7.0.   ACE level is normal.  Hepatitis B core antibody, hepatitis C antibody, HIV antibody are negative.  Uric acid is normal.  LDH is elevated.  3.  Infectious disease:  Patient's fever curve improved on steroids (Tmax 102.3 at 4:15 AM today).  Azithromycin discontinued.  Patient on day 10 of Zosyn with plan for discontinuation tomorrow if cultures negative.   Extensive work-up underway (mycoplasma serology, Legionella, urine strept pneumonia, fungal serology, fungal culture, AFB, quantiferon gold, upper respiratory PCR, viral culture).  Appreciate ID consult.  4.  Pulmonary:  Patient with nodular interstitial process in lungs of unclear etiology unclear.  Patient s/p bronchoscopy on 11/30/2017.  Cytology is negative (no evidence of malignancy).  Await culture information.  5.  Renal:  Increasing creatinine (1.7 to 2.2 to 2.35 to 3.06).  Renal ultrasound negative.  Concern for lupus.  Renal biopsy planned for tomorrow.  Appreciate nephrology consult.  6.  Rheumatology:  Patient meets criteria for lupus.  Prior positive RNP.  Anti-DS DNA is negative.  ENA pending.  Complement (C3, C4) are low.   Appreciate rheumatology consult.   Lequita Asal, MD  12/02/2017 , 5:44 PM

## 2017-12-02 NOTE — Progress Notes (Signed)
Melanie Holloway INFECTIOUS DISEASE PROGRESS NOTE Date of Admission:  11/18/2017     ID: Melanie Holloway is a 52 y.o. female with fevers, LAN  Active Problems:   Respiratory failure (HCC)  Subjective: No fevers, continues to feel a little better. Cr worsening. .    ROS  Eleven systems are reviewed and negative except per hpi  Medications:  Antibiotics Given (last 72 hours)    Date/Time Action Medication Dose Rate   11/29/17 2238 New Bag/Given   piperacillin-tazobactam (ZOSYN) IVPB 3.375 g 3.375 g 12.5 mL/hr   11/30/17 0930 New Bag/Given   piperacillin-tazobactam (ZOSYN) IVPB 3.375 g 3.375 g 12.5 mL/hr   11/30/17 1749 Given   azithromycin (ZITHROMAX) tablet 500 mg 500 mg    11/30/17 2228 New Bag/Given   piperacillin-tazobactam (ZOSYN) IVPB 3.375 g 3.375 g 12.5 mL/hr   12/01/17 0532 New Bag/Given   piperacillin-tazobactam (ZOSYN) IVPB 3.375 g 3.375 g 12.5 mL/hr   12/01/17 0918 Given   azithromycin (ZITHROMAX) tablet 500 mg 500 mg    12/01/17 1737 New Bag/Given   piperacillin-tazobactam (ZOSYN) IVPB 3.375 g 3.375 g 12.5 mL/hr   12/02/17 0030 New Bag/Given   piperacillin-tazobactam (ZOSYN) IVPB 3.375 g 3.375 g 12.5 mL/hr   12/02/17 1017 Given   azithromycin (ZITHROMAX) tablet 500 mg 500 mg    12/02/17 1018 New Bag/Given   piperacillin-tazobactam (ZOSYN) IVPB 3.375 g 3.375 g 12.5 mL/hr     . budesonide (PULMICORT) nebulizer solution  0.5 mg Nebulization BID  . butamben-tetracaine-benzocaine  1 spray Topical Once  . cholecalciferol  2,000 Units Oral Daily  . ferrous sulfate  325 mg Oral BID WC  . ipratropium-albuterol  3 mL Nebulization Q6H  . letrozole  2.5 mg Oral Daily  . lidocaine  1 application Topical Once  . methylPREDNISolone (SOLU-MEDROL) injection  60 mg Intravenous Q24H  . metoprolol tartrate  12.5 mg Oral BID  . multivitamin with minerals  1 tablet Oral Daily  . protein supplement shake  11 oz Oral BID BM  . sodium chloride flush  3 mL Intravenous Q12H     Objective: Vital signs in last 24 hours: Temp:  [98.1 F (36.7 C)-98.8 F (37.1 C)] 98.5 F (36.9 C) (02/14 1220) Pulse Rate:  [91-104] 99 (02/14 1220) Resp:  [16-20] 20 (02/14 1220) BP: (128-148)/(74-95) 148/95 (02/14 1220) SpO2:  [100 %] 100 % (02/14 1339) Weight:  [68.9 kg (151 lb 12.8 oz)] 68.9 kg (151 lb 12.8 oz) (02/14 0500) Constitutional:  oriented to person, place, and time. Mildly ill appearing, on O2 via [place] HENT: [place]/AT, PERRLA, no scleral icterus Mouth/Throat: Oropharynx is clear and dry . No oropharyngeal exudate.  Cardiovascular: Tachy, gallop Pulmonary/Chest: poor air movement, rhonchi throughout Neck = supple, no nuchal rigidity Abdominal: Soft. Bowel sounds are normal.  exhibits no distension. There is no tenderness.  Lymphadenopathy: no cervical adenopathy. No axillary adenopathy Neurological: alert and oriented to person, place, and time.  Skin: Skin is warm and dry. No rash noted. No erythema.  Ext 1+ edema bil LE Psychiatric: a normal mood and affect.  behavior is normal.    Lab Results Recent Labs    12/01/17 0301 12/02/17 0602  WBC 3.4* 3.0*  HGB 9.1* 9.0*  HCT 26.3* 25.9*  NA 128* 131*  K 3.8 3.9  CL 100* 102  CO2 17* 18*  BUN 62* 75*  CREATININE 2.35* 3.06*    Microbiology: Results for orders placed or performed during the hospital encounter of 11/18/17  MRSA PCR Screening  Status: None   Collection Time: 11/18/17  9:50 PM  Result Value Ref Range Status   MRSA by PCR NEGATIVE NEGATIVE Final    Comment:        The GeneXpert MRSA Assay (FDA approved for NASAL specimens only), is one component of a comprehensive MRSA colonization surveillance program. It is not intended to diagnose MRSA infection nor to guide or monitor treatment for MRSA infections. Performed at [facility], [address] [place], [place] [address]   MRSA PCR Screening     Status: None   Collection Time: 11/23/17  5:41 PM  Result Value Ref Range  Status   MRSA by PCR NEGATIVE NEGATIVE Final    Comment:        The GeneXpert MRSA Assay (FDA approved for NASAL specimens only), is one component of a comprehensive MRSA colonization surveillance program. It is not intended to diagnose MRSA infection nor to guide or monitor treatment for MRSA infections. Performed at [facility], [address] [place], [place] [address]   Gastrointestinal Panel by PCR , Stool     Status: None   Collection Time: 11/26/17  8:12 AM  Result Value Ref Range Status   Campylobacter species NOT DETECTED NOT DETECTED Final   Plesimonas shigelloides NOT DETECTED NOT DETECTED Final   Salmonella species NOT DETECTED NOT DETECTED Final   Yersinia enterocolitica NOT DETECTED NOT DETECTED Final   Vibrio species NOT DETECTED NOT DETECTED Final   Vibrio cholerae NOT DETECTED NOT DETECTED Final   Enteroaggregative E coli (EAEC) NOT DETECTED NOT DETECTED Final   Enteropathogenic E coli (EPEC) NOT DETECTED NOT DETECTED Final   Enterotoxigenic E coli (ETEC) NOT DETECTED NOT DETECTED Final   Shiga like toxin producing E coli (STEC) NOT DETECTED NOT DETECTED Final   Shigella/Enteroinvasive E coli (EIEC) NOT DETECTED NOT DETECTED Final   Cryptosporidium NOT DETECTED NOT DETECTED Final   Cyclospora cayetanensis NOT DETECTED NOT DETECTED Final   Entamoeba histolytica NOT DETECTED NOT DETECTED Final   Giardia lamblia NOT DETECTED NOT DETECTED Final   Adenovirus F40/41 NOT DETECTED NOT DETECTED Final   Astrovirus NOT DETECTED NOT DETECTED Final   Norovirus GI/GII NOT DETECTED NOT DETECTED Final   Rotavirus A NOT DETECTED NOT DETECTED Final   Sapovirus (I, II, IV, and V) NOT DETECTED NOT DETECTED Final    Comment: Performed at [facility] [place], [address] [place], [place] [address]  C difficile quick scan w PCR reflex     Status: None   Collection Time: 11/26/17  8:12 AM  Result Value Ref Range Status   C Diff antigen NEGATIVE NEGATIVE Final   C  Diff toxin NEGATIVE NEGATIVE Final   C Diff interpretation No C. difficile detected.  Final    Comment: Performed at [facility], [address] [place], [place] [address]  CULTURE, BLOOD (ROUTINE X 2) w Reflex to ID Panel     Status: None   Collection Time: 11/27/17  8:58 AM  Result Value Ref Range Status   Specimen Description BLOOD RIGHT ANTECUBITAL  Final   Special Requests   Final    BOTTLES DRAWN AEROBIC AND ANAEROBIC Blood Culture adequate volume   Culture   Final    NO GROWTH 5 DAYS Performed at [facility], [address] [place], [place] [address]    Report Status 12/02/2017 FINAL  Final  CULTURE, BLOOD (ROUTINE X 2) w Reflex to ID Panel     Status: None  Collection Time: 11/27/17 11:03 AM  Result Value Ref Range Status   Specimen Description BLOOD RIGHT ANTECUBITAL  Final   Special Requests   Final    BOTTLES DRAWN AEROBIC AND ANAEROBIC Blood Culture adequate volume   Culture   Final    NO GROWTH 5 DAYS Performed at [facility], [address] [place], [place] [address]    Report Status 12/02/2017 FINAL  Final  Respiratory Panel by PCR     Status: None   Collection Time: 11/29/17  5:30 PM  Result Value Ref Range Status   Adenovirus NOT DETECTED NOT DETECTED Final   Coronavirus 229E NOT DETECTED NOT DETECTED Final   Coronavirus HKU1 NOT DETECTED NOT DETECTED Final   Coronavirus NL63 NOT DETECTED NOT DETECTED Final   Coronavirus OC43 NOT DETECTED NOT DETECTED Final   Metapneumovirus NOT DETECTED NOT DETECTED Final   Rhinovirus / Enterovirus NOT DETECTED NOT DETECTED Final   Influenza A NOT DETECTED NOT DETECTED Final   Influenza A H1 NOT DETECTED NOT DETECTED Final   Influenza A H1 2009 NOT DETECTED NOT DETECTED Final   Influenza A H3 NOT DETECTED NOT DETECTED Final   Influenza B NOT DETECTED NOT DETECTED Final   Parainfluenza Virus 1 NOT DETECTED NOT DETECTED Final   Parainfluenza Virus 2 NOT DETECTED NOT DETECTED Final    Parainfluenza Virus 3 NOT DETECTED NOT DETECTED Final   Parainfluenza Virus 4 NOT DETECTED NOT DETECTED Final   Respiratory Syncytial Virus NOT DETECTED NOT DETECTED Final   Bordetella pertussis NOT DETECTED NOT DETECTED Final   Chlamydophila pneumoniae NOT DETECTED NOT DETECTED Final   Mycoplasma pneumoniae NOT DETECTED NOT DETECTED Final    Comment: Performed at [facility], [place] [address] [place], [place] [address]  Culture, expectorated sputum-assessment     Status: None   Collection Time: 11/29/17  9:35 PM  Result Value Ref Range Status   Specimen Description SPUTUM  Final   Special Requests NONE  Final   Sputum evaluation   Final    THIS SPECIMEN IS ACCEPTABLE FOR SPUTUM CULTURE Performed at [facility], [address] [place], [place] [address]    Report Status 11/30/2017 FINAL  Final  Culture, respiratory (NON-Expectorated)     Status: None (Preliminary result)   Collection Time: 11/29/17  9:35 PM  Result Value Ref Range Status   Specimen Description   Final    SPUTUM Performed at [facility], [address], [place], [place] [address]    Special Requests   Final    NONE Reflexed from [phone] Performed at [facility], [address] [place], [place] [address]    Gram Stain   Final    RARE WBC PRESENT, PREDOMINANTLY PMN FEW YEAST RARE GRAM POSITIVE COCCI IN PAIRS    Culture   Final    FEW YEAST IDENTIFICATION TO FOLLOW Performed at [facility], [place] [address] [place], [place] [address]    Report Status PENDING  Incomplete  Culture, bal-quantitative     Status: None (Preliminary result)   Collection Time: 11/30/17  2:50 PM  Result Value Ref Range Status   Specimen Description   Final    Bronch Lavag Performed at [facility], [address], [place], [place] [address]    Special Requests   Final    Immunocompromised Performed at [facility], [address] [place], [place] [address]    Gram  Stain   Final  RARE WBC PRESENT, PREDOMINANTLY PMN NO ORGANISMS SEEN    Culture   Final    CULTURE REINCUBATED FOR BETTER GROWTH Performed at [facility], [place] [address] [place], [place] [address]    Report Status PENDING  Incomplete  Acid Fast Smear (AFB)     Status: None   Collection Time: 11/30/17  2:50 PM  Result Value Ref Range Status   AFB Specimen Processing Concentration  Final   Acid Fast Smear Negative  Final    Comment: (NOTE) Performed At: [facility] [address] [place], [place] [id number] Rush Farmer MD WG:[id number]    Source (AFB) BRONCHIAL ALVEOLAR LAVAGE  Final    Comment: Performed at [facility], [address] [place], [place] [address]  Culture, fungus without smear     Status: None (Preliminary result)   Collection Time: 11/30/17  2:50 PM  Result Value Ref Range Status   Specimen Description   Final    BRONCHIAL ALVEOLAR LAVAGE Performed at [facility], [address], [place], [place] [address]    Special Requests   Final    Immunocompromised Performed at [facility], [address], [place], [place] [address]    Culture   Final    NO FUNGUS ISOLATED AFTER 2 DAYS Performed at [facility], [place] [address] [place], [place] [address]    Report Status PENDING  Incomplete    Studies/Results: US Renal  Result Date: 12/01/2017 CLINICAL DATA:  Acute renal failure. EXAM: RENAL / URINARY TRACT ULTRASOUND COMPLETE COMPARISON:  CT abdomen pelvis 11/04/2017. FINDINGS: Right Kidney: Length: 9.4 cm. Echogenicity within normal limits. No mass or hydronephrosis visualized. Extrarenal pelvis is noted. Left Kidney: Length: 10.3 cm. Echogenicity within normal limits. No mass or hydronephrosis visualized. Bladder: Poorly distended. IMPRESSION: Normal exam. Electronically Signed   By: Lorin Picket M.D.   On: 12/01/2017 14:43    Assessment/Plan: Autumm Hattery is a 52 y.o. female with hx distant Breast  cancer, now being worked up by otpt oncology for lymphadenopathy (hilar, mediastinal, R intramammary and  R Arnold Line and Axillary) as well as pericardial effusion, progressive pulmonary process. She also has pancytopenia and now fevers.  She also has hx of lupus erythematosus, ANA > 1:1280 , but is not followed by rheumatology.   She is progressively ill over last several months with cough she reports beginning in Dec, night sweats, wt loss of 30-40 #s.  Differential dx is broad but I favor malignant or rheumatological process.  So far bcx neg, stool PCR,  C diff neg. Sputum cx pending. MRSA PCR neg.  Flu PCR neg x 2.  Resp PCR neg, ACE nml. Urine Lg neg, mycoplasma IgM neg.  2/12 -  Remains febrile, getting bronch. Repeat CT shows contined ground glass and nodular infiltrates. 2/13 remains febrile. Started solumedrol 2/12. Bronch studies P, GS with GPC. Cr up to 2.3. On 2L O2- sats stable.  Walking around now, some improvement.  Pending - Mycoplasma serology, Anca, antigens for legionella urine strep Pna, fungal serology,  2/14 - seen by renal and rheum- for renal bxp. Fevers improving - ? Related to prednisone effect.   Day 10 of zosyn. Recommendations Dc azithromycin since has completed course.  Cont zosyn but if cultures from BAL neg tomorrow can dc since will have had 11 days of IV abx.  Studies from bronch Pending -routine cx, Fungal, AFB smear and cx and viral culture Agree with continue  steroids for warm autoantibody  Thank you very much for the consult. Will follow with you.  Leonel Ramsay   12/02/2017, 3:30 PM

## 2017-12-03 ENCOUNTER — Inpatient Hospital Stay: Payer: 59

## 2017-12-03 ENCOUNTER — Ambulatory Visit: Payer: 59 | Admitting: Oncology

## 2017-12-03 LAB — CBC
HCT: 24.6 % — ABNORMAL LOW (ref 35.0–47.0)
HEMOGLOBIN: 8.6 g/dL — AB (ref 12.0–16.0)
MCH: 29.5 pg (ref 26.0–34.0)
MCHC: 34.8 g/dL (ref 32.0–36.0)
MCV: 84.7 fL (ref 80.0–100.0)
Platelets: 90 10*3/uL — ABNORMAL LOW (ref 150–440)
RBC: 2.91 MIL/uL — AB (ref 3.80–5.20)
RDW: 15.2 % — ABNORMAL HIGH (ref 11.5–14.5)
WBC: 2.9 10*3/uL — AB (ref 3.6–11.0)

## 2017-12-03 LAB — BASIC METABOLIC PANEL
ANION GAP: 12 (ref 5–15)
BUN: 83 mg/dL — AB (ref 6–20)
CHLORIDE: 100 mmol/L — AB (ref 101–111)
CO2: 17 mmol/L — ABNORMAL LOW (ref 22–32)
Calcium: 8.6 mg/dL — ABNORMAL LOW (ref 8.9–10.3)
Creatinine, Ser: 2.95 mg/dL — ABNORMAL HIGH (ref 0.44–1.00)
GFR calc Af Amer: 20 mL/min — ABNORMAL LOW (ref >60)
GFR calc non Af Amer: 17 mL/min — ABNORMAL LOW (ref >60)
Glucose, Bld: 182 mg/dL — ABNORMAL HIGH (ref 65–99)
POTASSIUM: 3.4 mmol/L — AB (ref 3.5–5.1)
SODIUM: 129 mmol/L — AB (ref 135–145)

## 2017-12-03 LAB — CULTURE, RESPIRATORY W GRAM STAIN

## 2017-12-03 LAB — URINALYSIS, COMPLETE (UACMP) WITH MICROSCOPIC
Bacteria, UA: NONE SEEN
Bilirubin Urine: NEGATIVE
Glucose, UA: NEGATIVE mg/dL
Ketones, ur: NEGATIVE mg/dL
Leukocytes, UA: NEGATIVE
NITRITE: NEGATIVE
Protein, ur: >300 mg/dL — AB
RBC / HPF: NONE SEEN RBC/hpf (ref 0–5)
SPECIFIC GRAVITY, URINE: 1.025 (ref 1.005–1.030)
WBC UA: NONE SEEN WBC/hpf (ref 0–5)
pH: 5.5 (ref 5.0–8.0)

## 2017-12-03 LAB — FUNGAL ANTIBODIES PANEL, ID-BLOOD
ASPERGILLUS NIGER: NEGATIVE
Aspergillus flavus: NEGATIVE
Aspergillus fumigatus, IgG: NEGATIVE
Blastomyces Abs, Qn, DID: NEGATIVE
Histoplasma Ab, Immunodiffusion: NEGATIVE

## 2017-12-03 LAB — EXTRACTABLE NUCLEAR ANTIGEN ANTIBODY
ENA SM Ab Ser-aCnc: >8 AI — ABNORMAL HIGH (ref 0.0–0.9)
Ribonucleic Protein: >8 AI — ABNORMAL HIGH (ref 0.0–0.9)
SSA (Ro) (ENA) Antibody, IgG: 0.2 AI (ref 0.0–0.9)
SSB (La) (ENA) Antibody, IgG: <0.2 AI (ref 0.0–0.9)
Scleroderma (Scl-70) (ENA) Antibody, IgG: 0.2 AI (ref 0.0–0.9)
ds DNA Ab: 1 IU/mL (ref 0–9)

## 2017-12-03 LAB — C4 COMPLEMENT: Complement C4, Body Fluid: 3 mg/dL — ABNORMAL LOW (ref 14–44)

## 2017-12-03 LAB — CULTURE, RESPIRATORY

## 2017-12-03 LAB — C3 COMPLEMENT: C3 Complement: 19 mg/dL — ABNORMAL LOW (ref 82–167)

## 2017-12-03 LAB — URIC ACID: Uric Acid, Serum: 7.7 mg/dL — ABNORMAL HIGH (ref 2.3–6.6)

## 2017-12-03 MED ORDER — PANTOPRAZOLE SODIUM 40 MG PO TBEC
40.0000 mg | DELAYED_RELEASE_TABLET | Freq: Every day | ORAL | Status: DC
  Administered 2017-12-03 – 2017-12-08 (×6): 40 mg via ORAL
  Filled 2017-12-03 (×6): qty 1

## 2017-12-03 MED ORDER — SODIUM CHLORIDE 0.9 % IV SOLN
INTRAVENOUS | Status: AC | PRN
  Administered 2017-12-03: 10 mL/h via INTRAVENOUS

## 2017-12-03 MED ORDER — POLYETHYLENE GLYCOL 3350 17 G PO PACK
17.0000 g | PACK | Freq: Every day | ORAL | Status: DC
  Administered 2017-12-03: 17 g via ORAL
  Filled 2017-12-03 (×5): qty 1

## 2017-12-03 MED ORDER — FENTANYL CITRATE (PF) 100 MCG/2ML IJ SOLN
INTRAMUSCULAR | Status: AC | PRN
  Administered 2017-12-03: 50 ug via INTRAVENOUS

## 2017-12-03 MED ORDER — FENTANYL CITRATE (PF) 100 MCG/2ML IJ SOLN
INTRAMUSCULAR | Status: AC
  Filled 2017-12-03: qty 4

## 2017-12-03 MED ORDER — MIDAZOLAM HCL 5 MG/5ML IJ SOLN
INTRAMUSCULAR | Status: AC | PRN
  Administered 2017-12-03: 1 mg via INTRAVENOUS

## 2017-12-03 MED ORDER — MIDAZOLAM HCL 5 MG/5ML IJ SOLN
INTRAMUSCULAR | Status: AC
  Filled 2017-12-03: qty 5

## 2017-12-03 NOTE — Progress Notes (Signed)
[place] INFECTIOUS DISEASE PROGRESS NOTE Date of Admission:  11/18/2017     ID: Melanie Holloway is a 52 y.o. female with fevers, LAN  Active Problems:   Respiratory failure (HCC)  Subjective: Remains afebrile. Had renal bxp today.   ROS  Eleven systems are reviewed and negative except per hpi  Medications:  Antibiotics Given (last 72 hours)    Date/Time Action Medication Dose Rate   11/30/17 1749 Given   azithromycin (ZITHROMAX) tablet 500 mg 500 mg    11/30/17 2228 New Bag/Given   piperacillin-tazobactam (ZOSYN) IVPB 3.375 g 3.375 g 12.5 mL/hr   12/01/17 0532 New Bag/Given   piperacillin-tazobactam (ZOSYN) IVPB 3.375 g 3.375 g 12.5 mL/hr   12/01/17 0981 Given   azithromycin (ZITHROMAX) tablet 500 mg 500 mg    12/01/17 1737 New Bag/Given   piperacillin-tazobactam (ZOSYN) IVPB 3.375 g 3.375 g 12.5 mL/hr   12/02/17 0030 New Bag/Given   piperacillin-tazobactam (ZOSYN) IVPB 3.375 g 3.375 g 12.5 mL/hr   12/02/17 1017 Given   azithromycin (ZITHROMAX) tablet 500 mg 500 mg    12/02/17 1018 New Bag/Given   piperacillin-tazobactam (ZOSYN) IVPB 3.375 g 3.375 g 12.5 mL/hr   12/02/17 2101 New Bag/Given   piperacillin-tazobactam (ZOSYN) IVPB 3.375 g 3.375 g 12.5 mL/hr   12/03/17 1428 New Bag/Given   piperacillin-tazobactam (ZOSYN) IVPB 3.375 g 3.375 g 12.5 mL/hr     . budesonide (PULMICORT) nebulizer solution  0.5 mg Nebulization BID  . butamben-tetracaine-benzocaine  1 spray Topical Once  . cholecalciferol  2,000 Units Oral Daily  . fentaNYL      . ferrous sulfate  325 mg Oral BID WC  . ipratropium-albuterol  3 mL Nebulization Q6H  . letrozole  2.5 mg Oral Daily  . lidocaine  1 application Topical Once  . methylPREDNISolone (SOLU-MEDROL) injection  60 mg Intravenous Q24H  . metoprolol tartrate  12.5 mg Oral BID  . midazolam      . multivitamin with minerals  1 tablet Oral Daily  . pantoprazole  40 mg Oral Daily  . polyethylene glycol  17 g Oral Daily  . protein supplement  shake  11 oz Oral BID BM  . sodium chloride flush  3 mL Intravenous Q12H    Objective: Vital signs in last 24 hours: Temp:  [98.2 F (36.8 C)-98.3 F (36.8 C)] 98.3 F (36.8 C) (02/15 0840) Pulse Rate:  [82-110] 88 (02/15 1426) Resp:  [18-27] 18 (02/15 1426) BP: (133-167)/(77-95) 137/84 (02/15 1426) SpO2:  [96 %-100 %] 96 % (02/15 1426) Weight:  [65.4 kg (144 lb 1.6 oz)] 65.4 kg (144 lb 1.6 oz) (02/15 0410) Constitutional:  oriented to person, place, and time. Mildly ill appearing, on O2 via [place] HENT: [place]/AT, PERRLA, no scleral icterus Mouth/Throat: Oropharynx is clear and dry . No oropharyngeal exudate.  Cardiovascular: Tachy, gallop Pulmonary/Chest: poor air movement, rhonchi throughout Neck = supple, no nuchal rigidity Abdominal: Soft. Bowel sounds are normal.  exhibits no distension. There is no tenderness.  Lymphadenopathy: no cervical adenopathy. No axillary adenopathy Neurological: alert and oriented to person, place, and time.  Skin: Skin is warm and dry. No rash noted. No erythema.  Ext 1+ edema bil LE Psychiatric: a normal mood and affect.  behavior is normal.    Lab Results Recent Labs    12/02/17 0602 12/03/17 0559  WBC 3.0* 2.9*  HGB 9.0* 8.6*  HCT 25.9* 24.6*  NA 131* 129*  K 3.9 3.4*  CL 102 100*  CO2 18* 17*  BUN 75*  57*  CREATININE 3.06* 2.95*    Microbiology: Results for orders placed or performed during the hospital encounter of 11/18/17  MRSA PCR Screening     Status: None   Collection Time: 11/18/17  9:50 PM  Result Value Ref Range Status   MRSA by PCR NEGATIVE NEGATIVE Final    Comment:        The GeneXpert MRSA Assay (FDA approved for NASAL specimens only), is one component of a comprehensive MRSA colonization surveillance program. It is not intended to diagnose MRSA infection nor to guide or monitor treatment for MRSA infections. Performed at [facility], [address] [place], [place] [address]   MRSA PCR Screening      Status: None   Collection Time: 11/23/17  5:41 PM  Result Value Ref Range Status   MRSA by PCR NEGATIVE NEGATIVE Final    Comment:        The GeneXpert MRSA Assay (FDA approved for NASAL specimens only), is one component of a comprehensive MRSA colonization surveillance program. It is not intended to diagnose MRSA infection nor to guide or monitor treatment for MRSA infections. Performed at [facility], [address] [place], [place] [address]   Gastrointestinal Panel by PCR , Stool     Status: None   Collection Time: 11/26/17  8:12 AM  Result Value Ref Range Status   Campylobacter species NOT DETECTED NOT DETECTED Final   Plesimonas shigelloides NOT DETECTED NOT DETECTED Final   Salmonella species NOT DETECTED NOT DETECTED Final   Yersinia enterocolitica NOT DETECTED NOT DETECTED Final   Vibrio species NOT DETECTED NOT DETECTED Final   Vibrio cholerae NOT DETECTED NOT DETECTED Final   Enteroaggregative E coli (EAEC) NOT DETECTED NOT DETECTED Final   Enteropathogenic E coli (EPEC) NOT DETECTED NOT DETECTED Final   Enterotoxigenic E coli (ETEC) NOT DETECTED NOT DETECTED Final   Shiga like toxin producing E coli (STEC) NOT DETECTED NOT DETECTED Final   Shigella/Enteroinvasive E coli (EIEC) NOT DETECTED NOT DETECTED Final   Cryptosporidium NOT DETECTED NOT DETECTED Final   Cyclospora cayetanensis NOT DETECTED NOT DETECTED Final   Entamoeba histolytica NOT DETECTED NOT DETECTED Final   Giardia lamblia NOT DETECTED NOT DETECTED Final   Adenovirus F40/41 NOT DETECTED NOT DETECTED Final   Astrovirus NOT DETECTED NOT DETECTED Final   Norovirus GI/GII NOT DETECTED NOT DETECTED Final   Rotavirus A NOT DETECTED NOT DETECTED Final   Sapovirus (I, II, IV, and V) NOT DETECTED NOT DETECTED Final    Comment: Performed at [facility], [address] [place], [place] [address]  C difficile quick scan w PCR reflex     Status: None   Collection Time: 11/26/17  8:12 AM   Result Value Ref Range Status   C Diff antigen NEGATIVE NEGATIVE Final   C Diff toxin NEGATIVE NEGATIVE Final   C Diff interpretation No C. difficile detected.  Final    Comment: Performed at [facility], [address] [place], [place] [address]  CULTURE, BLOOD (ROUTINE X 2) w Reflex to ID Panel     Status: None   Collection Time: 11/27/17  8:58 AM  Result Value Ref Range Status   Specimen Description BLOOD RIGHT ANTECUBITAL  Final   Special Requests   Final    BOTTLES DRAWN AEROBIC AND ANAEROBIC Blood Culture adequate volume   Culture   Final    NO GROWTH 5 DAYS Performed at [facility], [place],  [place] [address]    Report Status 12/02/2017 FINAL  Final  CULTURE, BLOOD (ROUTINE X 2) w Reflex to ID Panel     Status: None   Collection Time: 11/27/17 11:03 AM  Result Value Ref Range Status   Specimen Description BLOOD RIGHT ANTECUBITAL  Final   Special Requests   Final    BOTTLES DRAWN AEROBIC AND ANAEROBIC Blood Culture adequate volume   Culture   Final    NO GROWTH 5 DAYS Performed at [facility], [address] [place], [place] [address]    Report Status 12/02/2017 FINAL  Final  Respiratory Panel by PCR     Status: None   Collection Time: 11/29/17  5:30 PM  Result Value Ref Range Status   Adenovirus NOT DETECTED NOT DETECTED Final   Coronavirus 229E NOT DETECTED NOT DETECTED Final   Coronavirus HKU1 NOT DETECTED NOT DETECTED Final   Coronavirus NL63 NOT DETECTED NOT DETECTED Final   Coronavirus OC43 NOT DETECTED NOT DETECTED Final   Metapneumovirus NOT DETECTED NOT DETECTED Final   Rhinovirus / Enterovirus NOT DETECTED NOT DETECTED Final   Influenza A NOT DETECTED NOT DETECTED Final   Influenza A H1 NOT DETECTED NOT DETECTED Final   Influenza A H1 2009 NOT DETECTED NOT DETECTED Final   Influenza A H3 NOT DETECTED NOT DETECTED Final   Influenza B NOT DETECTED NOT DETECTED Final   Parainfluenza Virus 1 NOT DETECTED NOT  DETECTED Final   Parainfluenza Virus 2 NOT DETECTED NOT DETECTED Final   Parainfluenza Virus 3 NOT DETECTED NOT DETECTED Final   Parainfluenza Virus 4 NOT DETECTED NOT DETECTED Final   Respiratory Syncytial Virus NOT DETECTED NOT DETECTED Final   Bordetella pertussis NOT DETECTED NOT DETECTED Final   Chlamydophila pneumoniae NOT DETECTED NOT DETECTED Final   Mycoplasma pneumoniae NOT DETECTED NOT DETECTED Final    Comment: Performed at [facility], [place] [address], [place], [place] [address]  Culture, expectorated sputum-assessment     Status: None   Collection Time: 11/29/17  9:35 PM  Result Value Ref Range Status   Specimen Description SPUTUM  Final   Special Requests NONE  Final   Sputum evaluation   Final    THIS SPECIMEN IS ACCEPTABLE FOR SPUTUM CULTURE Performed at [facility], [address] [place], [place] [address]    Report Status 11/30/2017 FINAL  Final  Culture, respiratory (NON-Expectorated)     Status: None   Collection Time: 11/29/17  9:35 PM  Result Value Ref Range Status   Specimen Description   Final    SPUTUM Performed at [facility], [address], [place], [place] [address]    Special Requests   Final    NONE Reflexed from [phone] Performed at [facility], [address] [place], [place] [address]    Gram Stain   Final    RARE WBC PRESENT, PREDOMINANTLY PMN FEW YEAST RARE GRAM POSITIVE COCCI IN PAIRS Performed at [facility], [place] [address], [place], [place] [address]    Culture FEW CANDIDA ALBICANS  Final   Report Status 12/03/2017 FINAL  Final  Culture, bal-quantitative     Status: None (Preliminary result)   Collection Time: 11/30/17  2:50 PM  Result Value Ref Range Status   Specimen Description   Final    Bronch Lavag Performed at [facility], [address], [place], [place] [address]    Special Requests   Final    Immunocompromised Performed  at [facility], [address] [place], [place] [address]    Gram Stain   Final    RARE WBC PRESENT, PREDOMINANTLY PMN NO ORGANISMS SEEN    Culture   Final    CULTURE REINCUBATED FOR BETTER GROWTH Performed at [place] [address], [place], [place] [address]    Report Status PENDING  Incomplete  Acid Fast Smear (AFB)     Status: None   Collection Time: 11/30/17  2:50 PM  Result Value Ref Range Status   AFB Specimen Processing Concentration  Final   Acid Fast Smear Negative  Final    Comment: (NOTE) Performed At: [facility] [address] [place], [place] [id number] Rush Farmer MD [id number]    Source (AFB) BRONCHIAL ALVEOLAR LAVAGE  Final    Comment: Performed at [facility], [address] [place], [place] [address]  Culture, fungus without smear     Status: None (Preliminary result)   Collection Time: 11/30/17  2:50 PM  Result Value Ref Range Status   Specimen Description   Final    BRONCHIAL ALVEOLAR LAVAGE Performed at [facility], [address] [place], [place] [address]    Special Requests   Final    Immunocompromised Performed at [facility], [address] [place], [place] [address]    Culture   Final    YEAST CULTURE REINCUBATED FOR BETTER GROWTH Performed at [facility], [place] [address] [place], [place] [address]    Report Status PENDING  Incomplete  Virus culture     Status: None   Collection Time: 11/30/17  2:50 PM  Result Value Ref Range Status   Viral Culture Comment  Final    Comment: (NOTE) Preliminary Report: No virus isolated at 24 hours. Next report to follow after 4 days. Performed At: [facility] [place], [place] [id number] Rush Farmer MD [id number]    Source of Sample BRONCHIAL ALVEOLAR LAVAGE  Final    Comment: Performed at [facility], [address] [place], [place] [address]    Studies/Results: US Biopsy (kidney)  Result Date: 12/03/2017 CLINICAL  DATA:  History of lupus with progressive renal insufficiency. Renal biopsy has been requested for nephropathologic assessment. EXAM: ULTRASOUND GUIDED CORE BIOPSY OF LEFT KIDNEY MEDICATIONS: 1.0 mg IV Versed; 50 mcg IV Fentanyl Total Moderate Sedation Time: 12 minutes. The patient's level of consciousness and physiologic status were continuously monitored during the procedure by Radiology nursing. PROCEDURE: The procedure, risks, benefits, and alternatives were explained to the patient. Questions regarding the procedure were encouraged and answered. The patient understands and consents to the procedure. A time out was performed prior to initiating the procedure. Ultrasound was performed of both kidneys. The left was chosen for biopsy. The left flank region was prepped with chlorhexidine in a sterile fashion, and a sterile drape was applied covering the operative field. A sterile gown and sterile gloves were used for the procedure. Local anesthesia was provided with 1% Lidocaine. Under ultrasound guidance, a 16 gauge core needle biopsy device was utilized in obtaining 2 separate samples from lower pole cortex of the left kidney. Both samples were placed on saline soaked Telfa and submitted to Pathology. Additional ultrasound was performed of the left kidney after completion of biopsy. COMPLICATIONS: None. FINDINGS: The left kidney is better visualized by ultrasound and also demonstrated better thickness of lower pole cortex compared to the right in the prone position. Solid, intact core biopsy  samples were obtained. IMPRESSION: Ultrasound-guided core biopsy performed of lower pole cortex of the left kidney. Two separate 16 gauge core biopsy samples were obtained. Electronically Signed   By: Aletta Edouard M.D.   On: 12/03/2017 12:32    Assessment/Plan: Melanie Holloway is a 51 y.o. female with hx distant Breast cancer, now being worked up by otpt oncology for lymphadenopathy (hilar, mediastinal, R intramammary and   R [place] and Axillary) as well as pericardial effusion, progressive pulmonary process. She also has pancytopenia and now fevers.  She also has hx of lupus erythematosus, ANA > 1:1280 , but is not followed by rheumatology.   She is progressively ill over last several months with cough she reports beginning in Dec, night sweats, wt loss of 30-40 #s.  Differential dx is broad but I favor malignant or rheumatological process.  So far bcx neg, stool PCR,  C diff neg. Sputum cx pending. MRSA PCR neg.  Flu PCR neg x 2.  Resp PCR neg, ACE nml. Urine Lg neg, mycoplasma IgM neg.  2/12 -  Remains febrile, getting bronch. Repeat CT shows contined ground glass and nodular infiltrates. 2/13 remains febrile. Started solumedrol 2/12. Bronch studies P, GS with GPC. Cr up to 2.3. On 2L O2- sats stable.  Walking around now, some improvement.  Pending - Mycoplasma serology, Anca, antigens for legionella urine strep Pna, fungal serology,  2/14 - seen by renal and rheum- for renal bxp. Fevers improving - ? Related to prednisone effect.  2/15 - no fevers since 2 /12. Cultures all negative except a yeast on BAL and candida albicans on Sputum cx. Candida does not cause an impressive PNA as she has so suspect all oral contaminant. ESR 129, marked increased IGG levels.  Day 11 of zosyn. Recommendations Can dc zosyn since will have had 11 days of IV abx. Continue workup and treatment of likely lupus.  Agree with continue  steroids for warm autoantibody   Thank you very much for the consult. Will follow with you.  Leonel Ramsay   12/03/2017, 3:02 PM

## 2017-12-03 NOTE — Care Management (Signed)
Barriers: Acute o2, kidney biopsy today.  Heads up referral has already been made to [facility]

## 2017-12-03 NOTE — Procedures (Signed)
Interventional Radiology Procedure Note  Procedure: US guided core biopsy of left kidney  Complications: None  Estimated Blood Loss: < 10 mL  Findings: 16 G core biopsy x 2 of lower pole cortex of left kidney.  Venetia Night. Kathlene Cote, M.D Pager:  [phone]

## 2017-12-03 NOTE — Progress Notes (Signed)
Patient ID: Melanie Holloway, female   DOB: May 29, 1966, 52 y.o.   MRN: [id number]  [facility] PROGRESS NOTE  Melanie Holloway CBS:[id number] DOB: 01/06/1966 DOA: 11/18/2017 PCP: Ria Bush, MD  HPI/Subjective:  Still on oxygen. Afebrile today. SOB improving  Objective: Vitals:   12/03/17 1209 12/03/17 1235  BP: 133/86 139/89  Pulse: 82 88  Resp: 18 18  Temp:    SpO2: 100% 99%    Filed Weights   12/01/17 0413 12/02/17 0500 12/03/17 0410  Weight: 68.1 kg (150 lb 2.1 oz) 68.9 kg (151 lb 12.8 oz) 65.4 kg (144 lb 1.6 oz)    ROS: Review of Systems  Constitutional: Positive for malaise/fatigue. Negative for chills and fever.  HENT: Negative for congestion and sore throat.   Eyes: Negative for blurred vision.  Respiratory: Positive for cough, shortness of breath and wheezing.   Cardiovascular: Positive for orthopnea. Negative for chest pain.  Gastrointestinal: Positive for diarrhea. Negative for abdominal pain, constipation, nausea and vomiting.  Genitourinary: Negative for dysuria.  Musculoskeletal: Negative for joint pain.  Neurological: Negative for dizziness and headaches.   Exam: Physical Exam  HENT:  Nose: No mucosal edema.  Mouth/Throat: No oropharyngeal exudate or posterior oropharyngeal edema.  Eyes: Conjunctivae, EOM and lids are normal. Pupils are equal, round, and reactive to light.  Neck: No JVD present. Carotid bruit is not present. No edema present. No thyroid mass and no thyromegaly present.  Cardiovascular: S1 normal and S2 normal. Exam reveals no gallop.  No murmur heard. Pulses:      Dorsalis pedis pulses are 2+ on the right side, and 2+ on the left side.  Respiratory: No respiratory distress. She has no wheezes. She has no rhonchi.  GI: Soft. Bowel sounds are normal. There is no tenderness.  Musculoskeletal:       Right ankle: She exhibits swelling.       Left ankle: She exhibits swelling.  Lymphadenopathy:    She has no cervical adenopathy.   Neurological: She is alert. No cranial nerve deficit.  Skin: Skin is warm. No rash noted. Nails show no clubbing.  Psychiatric: She has a normal mood and affect.   Data Reviewed: Basic Metabolic Panel: Recent Labs  Lab 11/29/17 0306 11/30/17 1231 12/01/17 0301 12/02/17 0602 12/03/17 0559  NA 129* 129* 128* 131* 129*  K 3.9 3.4* 3.8 3.9 3.4*  CL 100* 101 100* 102 100*  CO2 19* 18* 17* 18* 17*  GLUCOSE 107* 136* 165* 158* 182*  BUN 45* 56* 62* 75* 83*  CREATININE 1.70* 2.20* 2.35* 3.06* 2.95*  CALCIUM 8.3* 8.2* 8.4* 8.5* 8.6*   CBC: Recent Labs  Lab 11/28/17 0819 11/29/17 0306 11/30/17 1231 12/01/17 0301 12/02/17 0602 12/03/17 0559  WBC 3.0* 3.8 2.0* 3.4* 3.0* 2.9*  NEUTROABS 2.0 2.6 1.5 2.9 2.5  --   HGB 7.2* 7.7* 6.9* 9.1* 9.0* 8.6*  HCT 21.4* 23.0* 20.8* 26.3* 25.9* 24.6*  MCV 86.5 87.6 86.2 86.5 87.0 84.7  PLT 68* 76* 108* 117* 99* 90*   Cardiac Enzymes: Recent Labs  Lab 12/02/17 1428  CKTOTAL 24*   BNP (last 3 results) Recent Labs    11/18/17 1715 11/20/17 0405  BNP 2,975.0* 852.0*     CBG: No results for input(s): GLUCAP in the last 168 hours.  Recent Results (from the past 240 hour(s))  MRSA PCR Screening     Status: None   Collection Time: 11/23/17  5:41 PM  Result Value Ref Range Status   MRSA by PCR NEGATIVE  NEGATIVE Final    Comment:        The GeneXpert MRSA Assay (FDA approved for NASAL specimens only), is one component of a comprehensive MRSA colonization surveillance program. It is not intended to diagnose MRSA infection nor to guide or monitor treatment for MRSA infections. Performed at [facility], [address] [place], [place] [address]   Gastrointestinal Panel by PCR , Stool     Status: None   Collection Time: 11/26/17  8:12 AM  Result Value Ref Range Status   Campylobacter species NOT DETECTED NOT DETECTED Final   Plesimonas shigelloides NOT DETECTED NOT DETECTED Final   Salmonella species NOT DETECTED NOT  DETECTED Final   Yersinia enterocolitica NOT DETECTED NOT DETECTED Final   Vibrio species NOT DETECTED NOT DETECTED Final   Vibrio cholerae NOT DETECTED NOT DETECTED Final   Enteroaggregative E coli (EAEC) NOT DETECTED NOT DETECTED Final   Enteropathogenic E coli (EPEC) NOT DETECTED NOT DETECTED Final   Enterotoxigenic E coli (ETEC) NOT DETECTED NOT DETECTED Final   Shiga like toxin producing E coli (STEC) NOT DETECTED NOT DETECTED Final   Shigella/Enteroinvasive E coli (EIEC) NOT DETECTED NOT DETECTED Final   Cryptosporidium NOT DETECTED NOT DETECTED Final   Cyclospora cayetanensis NOT DETECTED NOT DETECTED Final   Entamoeba histolytica NOT DETECTED NOT DETECTED Final   Giardia lamblia NOT DETECTED NOT DETECTED Final   Adenovirus F40/41 NOT DETECTED NOT DETECTED Final   Astrovirus NOT DETECTED NOT DETECTED Final   Norovirus GI/GII NOT DETECTED NOT DETECTED Final   Rotavirus A NOT DETECTED NOT DETECTED Final   Sapovirus (I, II, IV, and V) NOT DETECTED NOT DETECTED Final    Comment: Performed at [facility], [address] [place], [place] [address]  C difficile quick scan w PCR reflex     Status: None   Collection Time: 11/26/17  8:12 AM  Result Value Ref Range Status   C Diff antigen NEGATIVE NEGATIVE Final   C Diff toxin NEGATIVE NEGATIVE Final   C Diff interpretation No C. difficile detected.  Final    Comment: Performed at [facility], [address] [place], [place] [address]  CULTURE, BLOOD (ROUTINE X 2) w Reflex to ID Panel     Status: None   Collection Time: 11/27/17  8:58 AM  Result Value Ref Range Status   Specimen Description BLOOD RIGHT ANTECUBITAL  Final   Special Requests   Final    BOTTLES DRAWN AEROBIC AND ANAEROBIC Blood Culture adequate volume   Culture   Final    NO GROWTH 5 DAYS Performed at [facility], [address] [place], [place] [address]    Report Status 12/02/2017 FINAL  Final  CULTURE, BLOOD (ROUTINE X 2) w Reflex  to ID Panel     Status: None   Collection Time: 11/27/17 11:03 AM  Result Value Ref Range Status   Specimen Description BLOOD RIGHT ANTECUBITAL  Final   Special Requests   Final    BOTTLES DRAWN AEROBIC AND ANAEROBIC Blood Culture adequate volume   Culture   Final    NO GROWTH 5 DAYS Performed at [facility], [address] [place], [place] [address]    Report Status 12/02/2017 FINAL  Final  Respiratory Panel by PCR     Status: None   Collection Time: 11/29/17  5:30 PM  Result Value Ref Range Status   Adenovirus NOT DETECTED NOT DETECTED Final   Coronavirus 229E NOT DETECTED NOT DETECTED  Final   Coronavirus HKU1 NOT DETECTED NOT DETECTED Final   Coronavirus NL63 NOT DETECTED NOT DETECTED Final   Coronavirus OC43 NOT DETECTED NOT DETECTED Final   Metapneumovirus NOT DETECTED NOT DETECTED Final   Rhinovirus / Enterovirus NOT DETECTED NOT DETECTED Final   Influenza A NOT DETECTED NOT DETECTED Final   Influenza A H1 NOT DETECTED NOT DETECTED Final   Influenza A H1 2009 NOT DETECTED NOT DETECTED Final   Influenza A H3 NOT DETECTED NOT DETECTED Final   Influenza B NOT DETECTED NOT DETECTED Final   Parainfluenza Virus 1 NOT DETECTED NOT DETECTED Final   Parainfluenza Virus 2 NOT DETECTED NOT DETECTED Final   Parainfluenza Virus 3 NOT DETECTED NOT DETECTED Final   Parainfluenza Virus 4 NOT DETECTED NOT DETECTED Final   Respiratory Syncytial Virus NOT DETECTED NOT DETECTED Final   Bordetella pertussis NOT DETECTED NOT DETECTED Final   Chlamydophila pneumoniae NOT DETECTED NOT DETECTED Final   Mycoplasma pneumoniae NOT DETECTED NOT DETECTED Final    Comment: Performed at [facility], [place] [address], [place], [place] [address]  Culture, expectorated sputum-assessment     Status: None   Collection Time: 11/29/17  9:35 PM  Result Value Ref Range Status   Specimen Description SPUTUM  Final   Special Requests NONE  Final   Sputum evaluation   Final    THIS SPECIMEN IS  ACCEPTABLE FOR SPUTUM CULTURE Performed at [facility], [address], [place], [place] [address]    Report Status 11/30/2017 FINAL  Final  Culture, respiratory (NON-Expectorated)     Status: None   Collection Time: 11/29/17  9:35 PM  Result Value Ref Range Status   Specimen Description   Final    SPUTUM Performed at [facility], [address] [place], [place] [address]    Special Requests   Final    NONE Reflexed from [id number] Performed at [facility], [address] [place], [place] [address]    Gram Stain   Final    RARE WBC PRESENT, PREDOMINANTLY PMN FEW YEAST RARE GRAM POSITIVE COCCI IN PAIRS Performed at [facility], [place] [address] [place], [place] [address]    Culture FEW CANDIDA ALBICANS  Final   Report Status 12/03/2017 FINAL  Final  Culture, bal-quantitative     Status: None (Preliminary result)   Collection Time: 11/30/17  2:50 PM  Result Value Ref Range Status   Specimen Description   Final    Bronch Lavag Performed at [facility], [address], [place], [place] [address]    Special Requests   Final    Immunocompromised Performed at [facility], [address] [place], [place] [address]    Gram Stain   Final    RARE WBC PRESENT, PREDOMINANTLY PMN NO ORGANISMS SEEN    Culture   Final    CULTURE REINCUBATED FOR BETTER GROWTH Performed at [facility], [place] [address], [place], [place] [address]    Report Status PENDING  Incomplete  Acid Fast Smear (AFB)     Status: None   Collection Time: 11/30/17  2:50 PM  Result Value Ref Range Status   AFB Specimen Processing Concentration  Final   Acid Fast Smear Negative  Final    Comment: (NOTE) Performed At: [facility] [place], [place] [id number] Rush Farmer MD [id number]    Source (AFB) BRONCHIAL ALVEOLAR LAVAGE  Final    Comment: Performed at [facility], [address] [place], [place] [address]   Culture, fungus without smear     Status: None (Preliminary result)   Collection Time: 11/30/17  2:50 PM  Result Value Ref Range Status   Specimen Description   Final    BRONCHIAL ALVEOLAR LAVAGE Performed at [facility], [address] [place], [place] [address]    Special Requests   Final    Immunocompromised Performed at [facility], [address], [place], [place] [address]    Culture   Final    CULTURE REINCUBATED FOR BETTER GROWTH Performed at [facility], [place] [address] [place], [place] [address]    Report Status PENDING  Incomplete  Virus culture     Status: None   Collection Time: 11/30/17  2:50 PM  Result Value Ref Range Status   Viral Culture Comment  Final    Comment: (NOTE) Preliminary Report: No virus isolated at 24 hours. Next report to follow after 4 days. Performed At: [facility] [place], [place] [id number] Rush Farmer MD WU:[id number]    Source of Sample BRONCHIAL ALVEOLAR LAVAGE  Final    Comment: Performed at [facility], [address] [place], [place] [address]      Scheduled Meds: . budesonide (PULMICORT) nebulizer solution  0.5 mg Nebulization BID  . butamben-tetracaine-benzocaine  1 spray Topical Once  . cholecalciferol  2,000 Units Oral Daily  . fentaNYL      . ferrous sulfate  325 mg Oral BID WC  . ipratropium-albuterol  3 mL Nebulization Q6H  . letrozole  2.5 mg Oral Daily  . lidocaine  1 application Topical Once  . methylPREDNISolone (SOLU-MEDROL) injection  60 mg Intravenous Q24H  . metoprolol tartrate  12.5 mg Oral BID  . midazolam      . multivitamin with minerals  1 tablet Oral Daily  . polyethylene glycol  17 g Oral Daily  . protein supplement shake  11 oz Oral BID BM  . sodium chloride flush  3 mL Intravenous Q12H   Continuous Infusions: . sodium chloride    . sodium chloride    . piperacillin-tazobactam (ZOSYN)  IV Stopped (12/03/17 0101)     Assessment/Plan:  * AKI Discussed with nephrology Dr. Holley Raring.  History of lupus. Renal ultrasound normal. 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 Kidney biopsy today  * HCAP. Fver has resolved Associated with Acute hypoxic resp failure  Discussed with Dr. Ola Spurr.   On IV Zosyn Azithromycin stopped Bronchoscopy 11/30/2017 Cx pending Stop zosyn today  * SLE Rheumatology f/u after discharge  * Acute systolic congestive heart failure with EF 45-50%.   Resolved  * Acute kidney injury on chronic kidney disease stage III.   * Metastatic breast cancer. LN biopsy negative for breast cancer.  .  Patient also has a pericardial effusion and lymphadenopathy seen on the chest. Malignant? Could also be due to Lupus.  * Essential hypertension.  Blood pressure on the lower side.  Held meds  * Weakness.  Physical therapy recommended home health.  * Pancytopenia with cancer history - Appreciate oncology input.   Code Status:     Code Status Orders  (From admission, onward)        Start     Ordered   11/18/17 2216  Full code  Continuous     11/18/17 2217    Code Status History    Date Active Date Inactive Code Status Order ID Comments User Context   09/22/2017 03:26  09/22/2017 19:40 Full Code [id number]  Lance Coon, MD Inpatient      Time spent: 35 minutes  [facility]

## 2017-12-03 NOTE — Progress Notes (Signed)
[facility]  ROUNDING NOTE   Subjective:  Patient underwent renal biopsy today. She tolerated this well. Biopsy specimen to be sent to [facility] nephro pathology.  Objective:  Vital signs in last 24 hours:  Temp:  [98.2 F (36.8 C)-98.3 F (36.8 C)] 98.3 F (36.8 C) (02/15 0840) Pulse Rate:  [82-110] 88 (02/15 1426) Resp:  [18-27] 18 (02/15 1426) BP: (133-167)/(77-95) 137/84 (02/15 1426) SpO2:  [96 %-100 %] 96 % (02/15 1426) Weight:  [65.4 kg (144 lb 1.6 oz)] 65.4 kg (144 lb 1.6 oz) (02/15 0410)  Weight change: -3.493 kg (-11.2 oz) Filed Weights   12/01/17 0413 12/02/17 0500 12/03/17 0410  Weight: 68.1 kg (150 lb 2.1 oz) 68.9 kg (151 lb 12.8 oz) 65.4 kg (144 lb 1.6 oz)    Intake/Output: I/O last 3 completed shifts: In: 400 [IV Piggyback:400] Out: 0    Intake/Output this shift:  Total I/O In: -  Out: 1 [Stool:1]  Physical Exam: General: No acute distress  Head: Normocephalic, atraumatic. Moist oral mucosal membranes  Eyes: Anicteric  Neck: Supple, trachea midline  Lungs:  Scattered rhonchi, normal effort  Heart: S1S2 no rubs  Abdomen:  Soft, nontender, bowel sounds present  Extremities: trace peripheral edema.  Neurologic: Awake, alert, following commands  Skin: No lesions       Basic Metabolic Panel: Recent Labs  Lab 11/29/17 0306 11/30/17 1231 12/01/17 0301 12/02/17 0602 12/03/17 0559  NA 129* 129* 128* 131* 129*  K 3.9 3.4* 3.8 3.9 3.4*  CL 100* 101 100* 102 100*  CO2 19* 18* 17* 18* 17*  GLUCOSE 107* 136* 165* 158* 182*  BUN 45* 56* 62* 75* 83*  CREATININE 1.70* 2.20* 2.35* 3.06* 2.95*  CALCIUM 8.3* 8.2* 8.4* 8.5* 8.6*    Liver Function Tests: Recent Labs  Lab 12/02/17 1428  AST 54*  ALT 30  ALKPHOS 37*  BILITOT 0.9  PROT 7.1  ALBUMIN 2.2*   No results for input(s): LIPASE, AMYLASE in the last 168 hours. No results for input(s): AMMONIA in the last 168 hours.  CBC: Recent Labs  Lab 11/28/17 0819 11/29/17 0306  11/30/17 1231 12/01/17 0301 12/02/17 0602 12/03/17 0559  WBC 3.0* 3.8 2.0* 3.4* 3.0* 2.9*  NEUTROABS 2.0 2.6 1.5 2.9 2.5  --   HGB 7.2* 7.7* 6.9* 9.1* 9.0* 8.6*  HCT 21.4* 23.0* 20.8* 26.3* 25.9* 24.6*  MCV 86.5 87.6 86.2 86.5 87.0 84.7  PLT 68* 76* 108* 117* 99* 90*    Cardiac Enzymes: Recent Labs  Lab 12/02/17 1428  CKTOTAL 24*    BNP: Invalid input(s): POCBNP  CBG: No results for input(s): GLUCAP in the last 168 hours.  Microbiology: Results for orders placed or performed during the hospital encounter of 11/18/17  MRSA PCR Screening     Status: None   Collection Time: 11/18/17  9:50 PM  Result Value Ref Range Status   MRSA by PCR NEGATIVE NEGATIVE Final    Comment:        The GeneXpert MRSA Assay (FDA approved for NASAL specimens only), is one component of a comprehensive MRSA colonization surveillance program. It is not intended to diagnose MRSA infection nor to guide or monitor treatment for MRSA infections. Performed at [facility], [address] [place], [place] [address]   MRSA PCR Screening     Status: None   Collection Time: 11/23/17  5:41 PM  Result Value Ref Range Status   MRSA by PCR NEGATIVE NEGATIVE Final    Comment:  The GeneXpert MRSA Assay (FDA approved for NASAL specimens only), is one component of a comprehensive MRSA colonization surveillance program. It is not intended to diagnose MRSA infection nor to guide or monitor treatment for MRSA infections. Performed at [facility], [address] [place], [place] [address]   Gastrointestinal Panel by PCR , Stool     Status: None   Collection Time: 11/26/17  8:12 AM  Result Value Ref Range Status   Campylobacter species NOT DETECTED NOT DETECTED Final   Plesimonas shigelloides NOT DETECTED NOT DETECTED Final   Salmonella species NOT DETECTED NOT DETECTED Final   Yersinia enterocolitica NOT DETECTED NOT DETECTED Final   Vibrio species NOT DETECTED NOT  DETECTED Final   Vibrio cholerae NOT DETECTED NOT DETECTED Final   Enteroaggregative E coli (EAEC) NOT DETECTED NOT DETECTED Final   Enteropathogenic E coli (EPEC) NOT DETECTED NOT DETECTED Final   Enterotoxigenic E coli (ETEC) NOT DETECTED NOT DETECTED Final   Shiga like toxin producing E coli (STEC) NOT DETECTED NOT DETECTED Final   Shigella/Enteroinvasive E coli (EIEC) NOT DETECTED NOT DETECTED Final   Cryptosporidium NOT DETECTED NOT DETECTED Final   Cyclospora cayetanensis NOT DETECTED NOT DETECTED Final   Entamoeba histolytica NOT DETECTED NOT DETECTED Final   Giardia lamblia NOT DETECTED NOT DETECTED Final   Adenovirus F40/41 NOT DETECTED NOT DETECTED Final   Astrovirus NOT DETECTED NOT DETECTED Final   Norovirus GI/GII NOT DETECTED NOT DETECTED Final   Rotavirus A NOT DETECTED NOT DETECTED Final   Sapovirus (I, II, IV, and V) NOT DETECTED NOT DETECTED Final    Comment: Performed at [facility], [address] [place], [place] [address]  C difficile quick scan w PCR reflex     Status: None   Collection Time: 11/26/17  8:12 AM  Result Value Ref Range Status   C Diff antigen NEGATIVE NEGATIVE Final   C Diff toxin NEGATIVE NEGATIVE Final   C Diff interpretation No C. difficile detected.  Final    Comment: Performed at [facility], [address] [place], [place] [address]  CULTURE, BLOOD (ROUTINE X 2) w Reflex to ID Panel     Status: None   Collection Time: 11/27/17  8:58 AM  Result Value Ref Range Status   Specimen Description BLOOD RIGHT ANTECUBITAL  Final   Special Requests   Final    BOTTLES DRAWN AEROBIC AND ANAEROBIC Blood Culture adequate volume   Culture   Final    NO GROWTH 5 DAYS Performed at [facility], [address] [place], [place] [address]    Report Status 12/02/2017 FINAL  Final  CULTURE, BLOOD (ROUTINE X 2) w Reflex to ID Panel     Status: None   Collection Time: 11/27/17 11:03 AM  Result Value Ref Range Status    Specimen Description BLOOD RIGHT ANTECUBITAL  Final   Special Requests   Final    BOTTLES DRAWN AEROBIC AND ANAEROBIC Blood Culture adequate volume   Culture   Final    NO GROWTH 5 DAYS Performed at [facility], [address], [place], [place] [address]    Report Status 12/02/2017 FINAL  Final  Respiratory Panel by PCR     Status: None   Collection Time: 11/29/17  5:30 PM  Result Value Ref Range Status   Adenovirus NOT DETECTED NOT DETECTED Final   Coronavirus 229E NOT DETECTED NOT DETECTED Final   Coronavirus HKU1 NOT DETECTED NOT DETECTED Final   Coronavirus  NL63 NOT DETECTED NOT DETECTED Final   Coronavirus OC43 NOT DETECTED NOT DETECTED Final   Metapneumovirus NOT DETECTED NOT DETECTED Final   Rhinovirus / Enterovirus NOT DETECTED NOT DETECTED Final   Influenza A NOT DETECTED NOT DETECTED Final   Influenza A H1 NOT DETECTED NOT DETECTED Final   Influenza A H1 2009 NOT DETECTED NOT DETECTED Final   Influenza A H3 NOT DETECTED NOT DETECTED Final   Influenza B NOT DETECTED NOT DETECTED Final   Parainfluenza Virus 1 NOT DETECTED NOT DETECTED Final   Parainfluenza Virus 2 NOT DETECTED NOT DETECTED Final   Parainfluenza Virus 3 NOT DETECTED NOT DETECTED Final   Parainfluenza Virus 4 NOT DETECTED NOT DETECTED Final   Respiratory Syncytial Virus NOT DETECTED NOT DETECTED Final   Bordetella pertussis NOT DETECTED NOT DETECTED Final   Chlamydophila pneumoniae NOT DETECTED NOT DETECTED Final   Mycoplasma pneumoniae NOT DETECTED NOT DETECTED Final    Comment: Performed at [facility], [place] [address], [place], [place] [address]  Culture, expectorated sputum-assessment     Status: None   Collection Time: 11/29/17  9:35 PM  Result Value Ref Range Status   Specimen Description SPUTUM  Final   Special Requests NONE  Final   Sputum evaluation   Final    THIS SPECIMEN IS ACCEPTABLE FOR SPUTUM CULTURE Performed at [facility], [address] [place], [place]  [address]    Report Status 11/30/2017 FINAL  Final  Culture, respiratory (NON-Expectorated)     Status: None   Collection Time: 11/29/17  9:35 PM  Result Value Ref Range Status   Specimen Description   Final    SPUTUM Performed at [facility], [address], [place], [place] [address]    Special Requests   Final    NONE Reflexed from [phone] Performed at [facility], [address] [place], [place] [address]    Gram Stain   Final    RARE WBC PRESENT, PREDOMINANTLY PMN FEW YEAST RARE GRAM POSITIVE COCCI IN PAIRS Performed at [facility], [place] [address] [place], [place] [address]    Culture FEW CANDIDA ALBICANS  Final   Report Status 12/03/2017 FINAL  Final  Culture, bal-quantitative     Status: None (Preliminary result)   Collection Time: 11/30/17  2:50 PM  Result Value Ref Range Status   Specimen Description   Final    Bronch Lavag Performed at [facility], [address] [place], [place] [address]    Special Requests   Final    Immunocompromised Performed at [facility], [address] [place], [place] [address]    Gram Stain   Final    RARE WBC PRESENT, PREDOMINANTLY PMN NO ORGANISMS SEEN    Culture   Final    CULTURE REINCUBATED FOR BETTER GROWTH Performed at [facility], [place] [address] [place], [place] [address]    Report Status PENDING  Incomplete  Acid Fast Smear (AFB)     Status: None   Collection Time: 11/30/17  2:50 PM  Result Value Ref Range Status   AFB Specimen Processing Concentration  Final   Acid Fast Smear Negative  Final    Comment: (NOTE) Performed At: [facility] [place], [place] [id number] Rush Farmer MD YP:[id number]    Source (AFB) BRONCHIAL ALVEOLAR LAVAGE  Final    Comment: Performed at [facility], [address], [place], [place] [address]  Culture, fungus without  smear     Status: None (Preliminary result)   Collection Time: 11/30/17  2:50  PM  Result Value Ref Range Status   Specimen Description   Final    BRONCHIAL ALVEOLAR LAVAGE Performed at [facility], [address] [place], [place] [address]    Special Requests   Final    Immunocompromised Performed at [facility], [address] [place], [place] [address]    Culture   Final    YEAST CULTURE REINCUBATED FOR BETTER GROWTH Performed at [facility], [place] [address] [place], [place] [address]    Report Status PENDING  Incomplete  Virus culture     Status: None   Collection Time: 11/30/17  2:50 PM  Result Value Ref Range Status   Viral Culture Comment  Final    Comment: (NOTE) Preliminary Report: No virus isolated at 24 hours. Next report to follow after 4 days. Performed At: [facility] [place], [place] [id number] Rush Farmer MD ME:[id number]    Source of Sample BRONCHIAL ALVEOLAR LAVAGE  Final    Comment: Performed at [facility], [address] [place], [place] [address]    Coagulation Studies: Recent Labs    12/02/17 1150  LABPROT 13.8  INR 1.07    Urinalysis: Recent Labs    12/03/17 0741  COLORURINE YELLOW  LABSPEC 1.025  PHURINE 5.5  GLUCOSEU NEGATIVE  HGBUR SMALL*  BILIRUBINUR NEGATIVE  KETONESUR NEGATIVE  PROTEINUR >300*  NITRITE NEGATIVE  LEUKOCYTESUR NEGATIVE      Imaging: US Biopsy (kidney)  Result Date: 12/03/2017 CLINICAL DATA:  History of lupus with progressive renal insufficiency. Renal biopsy has been requested for nephropathologic assessment. EXAM: ULTRASOUND GUIDED CORE BIOPSY OF LEFT KIDNEY MEDICATIONS: 1.0 mg IV Versed; 50 mcg IV Fentanyl Total Moderate Sedation Time: 12 minutes. The patient's level of consciousness and physiologic status were continuously monitored during the procedure by Radiology nursing. PROCEDURE: The procedure, risks, benefits, and alternatives were explained to the patient. Questions regarding the procedure were encouraged and  answered. The patient understands and consents to the procedure. A time out was performed prior to initiating the procedure. Ultrasound was performed of both kidneys. The left was chosen for biopsy. The left flank region was prepped with chlorhexidine in a sterile fashion, and a sterile drape was applied covering the operative field. A sterile gown and sterile gloves were used for the procedure. Local anesthesia was provided with 1% Lidocaine. Under ultrasound guidance, a 16 gauge core needle biopsy device was utilized in obtaining 2 separate samples from lower pole cortex of the left kidney. Both samples were placed on saline soaked Telfa and submitted to Pathology. Additional ultrasound was performed of the left kidney after completion of biopsy. COMPLICATIONS: None. FINDINGS: The left kidney is better visualized by ultrasound and also demonstrated better thickness of lower pole cortex compared to the right in the prone position. Solid, intact core biopsy samples were obtained. IMPRESSION: Ultrasound-guided core biopsy performed of lower pole cortex of the left kidney. Two separate 16 gauge core biopsy samples were obtained. Electronically Signed   By: Aletta Edouard M.D.   On: 12/03/2017 12:32     Medications:   . sodium chloride    . sodium chloride     . budesonide (PULMICORT) nebulizer solution  0.5 mg Nebulization BID  . butamben-tetracaine-benzocaine  1 spray Topical Once  . cholecalciferol  2,000 Units Oral Daily  . fentaNYL      . ferrous sulfate  325 mg Oral BID WC  . ipratropium-albuterol  3 mL Nebulization Q6H  . letrozole  2.5 mg Oral Daily  . lidocaine  1 application Topical Once  . methylPREDNISolone (SOLU-MEDROL) injection  60 mg Intravenous Q24H  . metoprolol tartrate  12.5 mg Oral BID  . midazolam      . multivitamin with minerals  1 tablet Oral Daily  . pantoprazole  40 mg Oral Daily  . polyethylene glycol  17 g Oral Daily  . protein supplement shake  11 oz Oral BID BM  .  sodium chloride flush  3 mL Intravenous Q12H   sodium chloride, acetaminophen, albuterol, ALPRAZolam, alum & mag hydroxide-simeth, benzonatate, guaiFENesin, ondansetron (ZOFRAN) IV, phenylephrine, sodium chloride flush, triamcinolone cream  Assessment/ Plan:  52 y.o. female with hypertension, anemia, vitamin D deficiency, breast cancer status post left mastectomy, chemotherapy and radiation, beta thalasemia, pancytopenia.    1.  Acute renal failure with chronic kidney disease stage III with history of proteinuria, hematuria, and history of positive ANA.   -  Renal function has slightly improved today and creatinine is currently 2.95.  Renal biopsy was performed and specimen to be sent to [facility] nephro pathology.  Once we have the diagnosis we can then formulate a treatment plan in conjunction with rheumatology.  For now continue to monitor renal function daily.  2.  Hypertension.  Blood pressure remains under good control at 137/84.  Maintain the patient on metoprolol.   LOS: 15 Boubacar Lerette 2/15/20193:46 PM

## 2017-12-03 NOTE — Progress Notes (Signed)
Referring Physician(s): Dr. Holley Raring  Supervising Physician: Aletta Edouard  Patient Status:  [facility] - In-pt  Chief Complaint: Renal failure  Subjective: Patient known to radiology department from recent lymph node biopsy 11/22/17. She has also developed progress worsening her renal function.  Blood work shows concern for lupus-induced renal failure. IR now consulted for random renal biopsy at the request of Dr. Holley Raring.  She has been NPO.   Allergies: Patient has no known allergies.  Medications: Prior to Admission medications   Medication Sig Start Date End Date Taking? Authorizing Provider  Cholecalciferol (VITAMIN D) 2000 UNITS CAPS Take 1 capsule (2,000 Units total) by mouth daily. 09/29/12  Yes Ria Bush, MD  cloNIDine (CATAPRES - DOSED IN MG/24 HR) 0.2 mg/24hr patch PLACE 1 PATCH (0.2 MG TOTAL) ONTO THE SKIN ONCE A WEEK. 10/18/17  Yes Ria Bush, MD  letrozole [facility]) 2.5 MG tablet Take 2.5 mg by mouth daily. 09/11/17  Yes [provider]  losartan-hydrochlorothiazide (HYZAAR) 50-12.5 MG tablet TAKE 1 TABLET BY MOUTH DAILY. 09/02/17  Yes Ria Bush, MD  acetaminophen (TYLENOL) 325 MG tablet Take 2 tablets (650 mg total) by mouth every 4 (four) hours as needed for headache or mild pain. 11/23/17   Gouru, Illene Silver, MD  albuterol (PROVENTIL HFA;VENTOLIN HFA) 108 (90 Base) MCG/ACT inhaler Inhale 2 puffs into the lungs every 4 (four) hours as needed for wheezing or shortness of breath. 11/17/17   Copland, Frederico Hamman, MD  ALPRAZolam Duanne Moron) 0.25 MG tablet Take 1 tablet (0.25 mg total) by mouth 2 (two) times daily as needed for anxiety. 11/23/17   Nicholes Mango, MD  carvedilol (COREG) 12.5 MG tablet Take 1 tablet (12.5 mg total) by mouth 2 (two) times daily with a meal. 11/23/17   Gouru, Aruna, MD  feeding supplement, ENSURE ENLIVE, (ENSURE ENLIVE) LIQD Take 237 mLs by mouth 2 (two) times daily between meals. Patient taking differently: Take 237 mLs by mouth as needed.   09/22/17   Epifanio Lesches, MD  furosemide (LASIX) 20 MG tablet Take 1 tablet (20 mg total) by mouth as needed for fluid or edema (Check daily weights and take Lasix 20 mg 1 tablet for weight gain of 1-2 pounds in 1 day). 11/23/17 11/23/18  Nicholes Mango, MD  Multiple Vitamin (MULTIVITAMIN WITH MINERALS) TABS tablet Take 1 tablet by mouth daily. 11/23/17   Nicholes Mango, MD  protein supplement shake (PREMIER PROTEIN) LIQD Take 325 mLs (11 oz total) by mouth 2 (two) times daily between meals. 11/23/17   Gouru, Illene Silver, MD  triamcinolone cream (KENALOG) 0.1 % Apply 1 application topically 2 (two) times daily. Apply to [place]. 03/29/17 03/29/18  Ria Bush, MD     Vital Signs: BP (!) 143/85   Pulse 92   Temp 98.3 F (36.8 C)   Resp 18   Ht 4\' 11"  (1.499 m)   Wt 144 lb 1.6 oz (65.4 kg)   SpO2 100%   BMI 29.10 kg/m   Physical Exam  Constitutional: She is oriented to person, place, and time. She appears well-developed.  Cardiovascular: Normal rate, regular rhythm and normal heart sounds.  Pulmonary/Chest: Effort normal and breath sounds normal. No respiratory distress.  Abdominal: Soft.  Neurological: She is alert and oriented to person, place, and time.  Skin: Skin is warm and dry.  Psychiatric: She has a normal mood and affect. Her behavior is normal.  Nursing note and vitals reviewed.   Imaging: Dg Chest 1 View  Result Date: 11/30/2017 CLINICAL DATA:  Post bronchoscopy  chest x-ray. History of breast malignancy, anemia, and abnormal chest CT findings on November 29, 2017. EXAM: CHEST 1 VIEW COMPARISON:  Chest x-ray of November 26, 2017. FINDINGS: The lungs are adequately inflated. There is no pneumothorax. The interstitial markings are increased bilaterally and more conspicuous than on the previous study. The hemidiaphragms remain visible. The cardiac silhouette remains enlarged. The pulmonary vascularity is mildly engorged. The trachea is midline. There surgical clips in the left axillary  region. IMPRESSION: No postprocedure pneumothorax. Interstitial changes in the setting of cardiomegaly and pulmonary vascular congestion is consistent with mild CHF. Electronically Signed   By: David  Martinique M.D.   On: 11/30/2017 14:39   Ct Chest Wo Contrast  Result Date: 11/29/2017 CLINICAL DATA:  52 year old female with hemoptysis and cough. Metastatic breast cancer. EXAM: CT CHEST WITHOUT CONTRAST TECHNIQUE: Multidetector CT imaging of the chest was performed following the standard protocol without IV contrast. COMPARISON:  Chest CTA 11/18/2017 and earlier. FINDINGS: Cardiovascular: Allowing for absent IV contrast cardiomegaly and pericardial effusion appears stable since 11/18/2017. Vascular patency is not evaluated in the absence of IV contrast. Calcified coronary artery atherosclerosis is evident. Lesser calcified aortic atherosclerosis. Mediastinum/Nodes: Extensive mediastinal and bilateral hilar lymphadenopathy has not significantly changed since 11/18/2017. Continued extensive right axillary lymphadenopathy, stable. Lungs/Pleura: Widespread bilateral centrilobular pulmonary ground-glass nodularity, less pronounced in the lung apices, largely new since 11/04/2017 and more extensive and nodular in appearance compared to 11/18/2017. Continued confluent ground-glass opacity throughout the left lower lobe and right costophrenic angle. Decreased layering right pleural effusion since 11/18/2017. Stable to mildly decreased left sub pulmonic effusion. Areas of subpleural nodularity and thickening in the left lung which may be post radiation related in light of prior left mastectomy. Upper Abdomen: Stable visible noncontrast upper abdominal viscera. Musculoskeletal: Mixed lytic and sclerotic skeletal metastases appear stable since 11/04/2017. IMPRESSION: 1. Continued widespread abnormal predominantly ground-glass pulmonary opacity, similar in distribution although more nodular in appearance since 11/18/2017.  Differential considerations are fairly broad and include infectious as well as noninfectious inflammation (including vasculitis), and lymphangitic carcinomatosis (in light of #2). Consider atypical infectious etiology if the patient is immunocompromised. 2. Continued extensive mediastinal and hilar lymphadenopathy. Continued extensive right axillary lymphadenopathy. 3. Cardiomegaly and pericardial effusion appear grossly stable since 11/18/2017. Layering right pleural effusion has regressed. Subpulmonic left pleural effusion is stable. 4. Stable osseous metastatic disease. Electronically Signed   By: Genevie Ann M.D.   On: 11/29/2017 16:26   US Renal  Result Date: 12/01/2017 CLINICAL DATA:  Acute renal failure. EXAM: RENAL / URINARY TRACT ULTRASOUND COMPLETE COMPARISON:  CT abdomen pelvis 11/04/2017. FINDINGS: Right Kidney: Length: 9.4 cm. Echogenicity within normal limits. No mass or hydronephrosis visualized. Extrarenal pelvis is noted. Left Kidney: Length: 10.3 cm. Echogenicity within normal limits. No mass or hydronephrosis visualized. Bladder: Poorly distended. IMPRESSION: Normal exam. Electronically Signed   By: Lorin Picket M.D.   On: 12/01/2017 14:43   Dg C-arm 1-60 Min-no Report  Result Date: 11/30/2017 Fluoroscopy was utilized by the requesting physician.  No radiographic interpretation.    Labs:  CBC: Recent Labs    11/30/17 1231 12/01/17 0301 12/02/17 0602 12/03/17 0559  WBC 2.0* 3.4* 3.0* 2.9*  HGB 6.9* 9.1* 9.0* 8.6*  HCT 20.8* 26.3* 25.9* 24.6*  PLT 108* 117* 99* 90*    COAGS: Recent Labs    11/22/17 0248 12/02/17 1150  INR 1.19 1.07  APTT 31  --     BMP: Recent Labs    11/30/17 1231 12/01/17 0301  12/02/17 0602 12/03/17 0559  NA 129* 128* 131* 129*  K 3.4* 3.8 3.9 3.4*  CL 101 100* 102 100*  CO2 18* 17* 18* 17*  GLUCOSE 136* 165* 158* 182*  BUN 56* 62* 75* 83*  CALCIUM 8.2* 8.4* 8.5* 8.6*  CREATININE 2.20* 2.35* 3.06* 2.95*  GFRNONAA 24* 22* 16* 17*    GFRAA 28* 26* 19* 20*    LIVER FUNCTION TESTS: Recent Labs    09/08/17 1539 09/21/17 1306 11/08/17 1455 12/02/17 1428  BILITOT 0.37 0.5 0.5 0.9  AST 19 27 17  54*  ALT 8 11* 7 30  ALKPHOS 78 80 63 37*  PROT 8.2 8.6* 7.5 7.1  ALBUMIN 2.6* 2.8* 2.5* 2.2*    Assessment and Plan: Acute Renal Failure Patient with worsening renal function.  IR consulted for random renal biopsy at the request of Dr. Holley Raring.  Patient has been NPO.  She does not take blood thinners.  Anticipate biopsy today if able to obtain UA.  Risks and benefits discussed with the patient including, but not limited to bleeding, infection, damage to adjacent structures or low yield requiring additional tests.  All of the patient's questions were answered, patient is agreeable to proceed. Consent signed and in chart.  Electronically Signed: Docia Barrier, PA 12/03/2017, 10:08 AM   I spent a total of 15 Minutes at the the patient's bedside AND on the patient's hospital floor or unit, greater than 50% of which was counseling/coordinating care for renal failure.

## 2017-12-04 LAB — CBC WITH DIFFERENTIAL/PLATELET
BASOS ABS: 0 10*3/uL (ref 0–0.1)
Basophils Relative: 0 %
EOS ABS: 0 10*3/uL (ref 0–0.7)
Eosinophils Relative: 0 %
HCT: 25.5 % — ABNORMAL LOW (ref 35.0–47.0)
Hemoglobin: 8.9 g/dL — ABNORMAL LOW (ref 12.0–16.0)
Lymphocytes Relative: 7 %
Lymphs Abs: 0.3 10*3/uL — ABNORMAL LOW (ref 1.0–3.6)
MCH: 30 pg (ref 26.0–34.0)
MCHC: 35.1 g/dL (ref 32.0–36.0)
MCV: 85.7 fL (ref 80.0–100.0)
Monocytes Absolute: 0.2 10*3/uL (ref 0.2–0.9)
Monocytes Relative: 6 %
Neutro Abs: 3.1 10*3/uL (ref 1.4–6.5)
Neutrophils Relative %: 87 %
PLATELETS: 113 10*3/uL — AB (ref 150–440)
RBC: 2.97 MIL/uL — AB (ref 3.80–5.20)
RDW: 15.4 % — ABNORMAL HIGH (ref 11.5–14.5)
WBC: 3.6 10*3/uL (ref 3.6–11.0)

## 2017-12-04 LAB — BASIC METABOLIC PANEL
Anion gap: 11 (ref 5–15)
BUN: 98 mg/dL — AB (ref 6–20)
CALCIUM: 8.6 mg/dL — AB (ref 8.9–10.3)
CO2: 18 mmol/L — ABNORMAL LOW (ref 22–32)
CREATININE: 2.48 mg/dL — AB (ref 0.44–1.00)
Chloride: 102 mmol/L (ref 101–111)
GFR calc Af Amer: 24 mL/min — ABNORMAL LOW (ref >60)
GFR, EST NON AFRICAN AMERICAN: 21 mL/min — AB (ref >60)
Glucose, Bld: 205 mg/dL — ABNORMAL HIGH (ref 65–99)
POTASSIUM: 3.2 mmol/L — AB (ref 3.5–5.1)
SODIUM: 131 mmol/L — AB (ref 135–145)

## 2017-12-04 MED ORDER — IPRATROPIUM-ALBUTEROL 0.5-2.5 (3) MG/3ML IN SOLN: 3.0000 mL | Freq: Four times a day (QID) | RESPIRATORY_TRACT | Status: DC | PRN

## 2017-12-04 MED ORDER — LOPERAMIDE HCL 2 MG PO CAPS: 2.0000 mg | ORAL_CAPSULE | Freq: Four times a day (QID) | ORAL | Status: DC | PRN

## 2017-12-04 MED ORDER — POTASSIUM CHLORIDE CRYS ER 20 MEQ PO TBCR
40.0000 meq | EXTENDED_RELEASE_TABLET | Freq: Once | ORAL | Status: AC
  Administered 2017-12-04: 40 meq via ORAL
  Filled 2017-12-04: qty 2

## 2017-12-04 MED ORDER — POTASSIUM CHLORIDE CRYS ER 20 MEQ PO TBCR
40.0000 meq | EXTENDED_RELEASE_TABLET | Freq: Once | ORAL | Status: AC
  Administered 2017-12-04: 09:00:00 40 meq via ORAL
  Filled 2017-12-04: qty 2

## 2017-12-04 NOTE — Progress Notes (Signed)
[facility]  ROUNDING NOTE   Subjective:  Patient continues to be doing well post renal biopsy. Renal function has actually improved a bit as creatinine is down to 2.48.  However BUN is up to 98 but patient is on steroids. Hemoglobin stable at 8.9.  Objective:  Vital signs in last 24 hours:  Temp:  [97.4 F (36.3 C)-98.2 F (36.8 C)] 98.2 F (36.8 C) (02/16 1401) Pulse Rate:  [88-97] 88 (02/16 1401) Resp:  [16-18] 18 (02/16 1401) BP: (137-140)/(84-90) 140/87 (02/16 1401) SpO2:  [96 %-100 %] 99 % (02/16 1401) Weight:  [66.1 kg (145 lb 12.8 oz)] 66.1 kg (145 lb 12.8 oz) (02/16 0500)  Weight change: 0.771 kg (1 lb 11.2 oz) Filed Weights   12/02/17 0500 12/03/17 0410 12/04/17 0500  Weight: 68.9 kg (151 lb 12.8 oz) 65.4 kg (144 lb 1.6 oz) 66.1 kg (145 lb 12.8 oz)    Intake/Output: I/O last 3 completed shifts: In: -  Out: 1 [Stool:1]   Intake/Output this shift:  Total I/O In: 480 [P.O.:480] Out: -   Physical Exam: General: No acute distress  Head: Normocephalic, atraumatic. Moist oral mucosal membranes  Eyes: Anicteric  Neck: Supple, trachea midline  Lungs:  Scattered rhonchi, normal effort  Heart: S1S2 no rubs  Abdomen:  Soft, nontender, bowel sounds present  Extremities: trace peripheral edema.  Neurologic: Awake, alert, following commands  Skin: No lesions       Basic Metabolic Panel: Recent Labs  Lab 11/30/17 1231 12/01/17 0301 12/02/17 0602 12/03/17 0559 12/04/17 0324  NA 129* 128* 131* 129* 131*  K 3.4* 3.8 3.9 3.4* 3.2*  CL 101 100* 102 100* 102  CO2 18* 17* 18* 17* 18*  GLUCOSE 136* 165* 158* 182* 205*  BUN 56* 62* 75* 83* 98*  CREATININE 2.20* 2.35* 3.06* 2.95* 2.48*  CALCIUM 8.2* 8.4* 8.5* 8.6* 8.6*    Liver Function Tests: Recent Labs  Lab 12/02/17 1428  AST 54*  ALT 30  ALKPHOS 37*  BILITOT 0.9  PROT 7.1  ALBUMIN 2.2*   No results for input(s): LIPASE, AMYLASE in the last 168 hours. No results for input(s): AMMONIA in  the last 168 hours.  CBC: Recent Labs  Lab 11/29/17 0306 11/30/17 1231 12/01/17 0301 12/02/17 0602 12/03/17 0559 12/04/17 0324  WBC 3.8 2.0* 3.4* 3.0* 2.9* 3.6  NEUTROABS 2.6 1.5 2.9 2.5  --  3.1  HGB 7.7* 6.9* 9.1* 9.0* 8.6* 8.9*  HCT 23.0* 20.8* 26.3* 25.9* 24.6* 25.5*  MCV 87.6 86.2 86.5 87.0 84.7 85.7  PLT 76* 108* 117* 99* 90* 113*    Cardiac Enzymes: Recent Labs  Lab 12/02/17 1428  CKTOTAL 24*    BNP: Invalid input(s): POCBNP  CBG: No results for input(s): GLUCAP in the last 168 hours.  Microbiology: Results for orders placed or performed during the hospital encounter of 11/18/17  MRSA PCR Screening     Status: None   Collection Time: 11/18/17  9:50 PM  Result Value Ref Range Status   MRSA by PCR NEGATIVE NEGATIVE Final    Comment:        The GeneXpert MRSA Assay (FDA approved for NASAL specimens only), is one component of a comprehensive MRSA colonization surveillance program. It is not intended to diagnose MRSA infection nor to guide or monitor treatment for MRSA infections. Performed at [facility], [address], [place], [place] [address]   MRSA PCR Screening     Status: None   Collection Time: 11/23/17  5:41  PM  Result Value Ref Range Status   MRSA by PCR NEGATIVE NEGATIVE Final    Comment:        The GeneXpert MRSA Assay (FDA approved for NASAL specimens only), is one component of a comprehensive MRSA colonization surveillance program. It is not intended to diagnose MRSA infection nor to guide or monitor treatment for MRSA infections. Performed at [facility], [address] [place], [place] [address]   Gastrointestinal Panel by PCR , Stool     Status: None   Collection Time: 11/26/17  8:12 AM  Result Value Ref Range Status   Campylobacter species NOT DETECTED NOT DETECTED Final   Plesimonas shigelloides NOT DETECTED NOT DETECTED Final   Salmonella species NOT DETECTED NOT DETECTED Final   Yersinia  enterocolitica NOT DETECTED NOT DETECTED Final   Vibrio species NOT DETECTED NOT DETECTED Final   Vibrio cholerae NOT DETECTED NOT DETECTED Final   Enteroaggregative E coli (EAEC) NOT DETECTED NOT DETECTED Final   Enteropathogenic E coli (EPEC) NOT DETECTED NOT DETECTED Final   Enterotoxigenic E coli (ETEC) NOT DETECTED NOT DETECTED Final   Shiga like toxin producing E coli (STEC) NOT DETECTED NOT DETECTED Final   Shigella/Enteroinvasive E coli (EIEC) NOT DETECTED NOT DETECTED Final   Cryptosporidium NOT DETECTED NOT DETECTED Final   Cyclospora cayetanensis NOT DETECTED NOT DETECTED Final   Entamoeba histolytica NOT DETECTED NOT DETECTED Final   Giardia lamblia NOT DETECTED NOT DETECTED Final   Adenovirus F40/41 NOT DETECTED NOT DETECTED Final   Astrovirus NOT DETECTED NOT DETECTED Final   Norovirus GI/GII NOT DETECTED NOT DETECTED Final   Rotavirus A NOT DETECTED NOT DETECTED Final   Sapovirus (I, II, IV, and V) NOT DETECTED NOT DETECTED Final    Comment: Performed at [facility], [address] [place], [place] [address]  C difficile quick scan w PCR reflex     Status: None   Collection Time: 11/26/17  8:12 AM  Result Value Ref Range Status   C Diff antigen NEGATIVE NEGATIVE Final   C Diff toxin NEGATIVE NEGATIVE Final   C Diff interpretation No C. difficile detected.  Final    Comment: Performed at [facility], [address] [place], [place] [address]  CULTURE, BLOOD (ROUTINE X 2) w Reflex to ID Panel     Status: None   Collection Time: 11/27/17  8:58 AM  Result Value Ref Range Status   Specimen Description BLOOD RIGHT ANTECUBITAL  Final   Special Requests   Final    BOTTLES DRAWN AEROBIC AND ANAEROBIC Blood Culture adequate volume   Culture   Final    NO GROWTH 5 DAYS Performed at [facility], [address] [place], [place] [address]    Report Status 12/02/2017 FINAL  Final  CULTURE, BLOOD (ROUTINE X 2) w Reflex to ID Panel     Status:  None   Collection Time: 11/27/17 11:03 AM  Result Value Ref Range Status   Specimen Description BLOOD RIGHT ANTECUBITAL  Final   Special Requests   Final    BOTTLES DRAWN AEROBIC AND ANAEROBIC Blood Culture adequate volume   Culture   Final    NO GROWTH 5 DAYS Performed at [facility], [address], [place], [place] [address]    Report Status 12/02/2017 FINAL  Final  Respiratory Panel by PCR     Status: None   Collection Time: 11/29/17  5:30 PM  Result Value Ref Range Status   Adenovirus  NOT DETECTED NOT DETECTED Final   Coronavirus 229E NOT DETECTED NOT DETECTED Final   Coronavirus HKU1 NOT DETECTED NOT DETECTED Final   Coronavirus NL63 NOT DETECTED NOT DETECTED Final   Coronavirus OC43 NOT DETECTED NOT DETECTED Final   Metapneumovirus NOT DETECTED NOT DETECTED Final   Rhinovirus / Enterovirus NOT DETECTED NOT DETECTED Final   Influenza A NOT DETECTED NOT DETECTED Final   Influenza A H1 NOT DETECTED NOT DETECTED Final   Influenza A H1 2009 NOT DETECTED NOT DETECTED Final   Influenza A H3 NOT DETECTED NOT DETECTED Final   Influenza B NOT DETECTED NOT DETECTED Final   Parainfluenza Virus 1 NOT DETECTED NOT DETECTED Final   Parainfluenza Virus 2 NOT DETECTED NOT DETECTED Final   Parainfluenza Virus 3 NOT DETECTED NOT DETECTED Final   Parainfluenza Virus 4 NOT DETECTED NOT DETECTED Final   Respiratory Syncytial Virus NOT DETECTED NOT DETECTED Final   Bordetella pertussis NOT DETECTED NOT DETECTED Final   Chlamydophila pneumoniae NOT DETECTED NOT DETECTED Final   Mycoplasma pneumoniae NOT DETECTED NOT DETECTED Final    Comment: Performed at [facility], [place] [address], [place], [place] [address]  Culture, expectorated sputum-assessment     Status: None   Collection Time: 11/29/17  9:35 PM  Result Value Ref Range Status   Specimen Description SPUTUM  Final   Special Requests NONE  Final   Sputum evaluation   Final    THIS SPECIMEN IS ACCEPTABLE FOR SPUTUM  CULTURE Performed at [facility], [address], [place], [place] [address]    Report Status 11/30/2017 FINAL  Final  Culture, respiratory (NON-Expectorated)     Status: None   Collection Time: 11/29/17  9:35 PM  Result Value Ref Range Status   Specimen Description   Final    SPUTUM Performed at [facility], [address] [place], [place] [address]    Special Requests   Final    NONE Reflexed from [phone] Performed at [facility], [address] [place], [place] [address]    Gram Stain   Final    RARE WBC PRESENT, PREDOMINANTLY PMN FEW YEAST RARE GRAM POSITIVE COCCI IN PAIRS Performed at [facility], [place] [address], [place], [place] [address]    Culture FEW CANDIDA ALBICANS  Final   Report Status 12/03/2017 FINAL  Final  Culture, bal-quantitative     Status: None (Preliminary result)   Collection Time: 11/30/17  2:50 PM  Result Value Ref Range Status   Specimen Description   Final    Bronch Lavag Performed at [facility], [address] [place], [place] [address]    Special Requests   Final    Immunocompromised Performed at [facility], [address] [place], [place] [address]    Gram Stain   Final    RARE WBC PRESENT, PREDOMINANTLY PMN NO ORGANISMS SEEN    Culture   Final    CULTURE REINCUBATED FOR BETTER GROWTH Performed at [facility], [place] [address] [place], [place] [address]    Report Status PENDING  Incomplete  Acid Fast Smear (AFB)     Status: None   Collection Time: 11/30/17  2:50 PM  Result Value Ref Range Status   AFB Specimen Processing Concentration  Final   Acid Fast Smear Negative  Final    Comment: (NOTE) Performed At: [facility] [address] [place], [place] [id number] Rush Farmer MD [id number]    Source (  AFB) BRONCHIAL ALVEOLAR LAVAGE  Final    Comment: Performed at [facility], [address] [place], [place] [address]  Culture, fungus  without smear     Status: None (Preliminary result)   Collection Time: 11/30/17  2:50 PM  Result Value Ref Range Status   Specimen Description   Final    BRONCHIAL ALVEOLAR LAVAGE Performed at [facility], [address] [place], [place] [address]    Special Requests   Final    Immunocompromised Performed at [facility], [address], [place], [place] [address]    Culture   Final    YEAST CULTURE REINCUBATED FOR BETTER GROWTH Performed at [facility], [place] [address], [place], [place] [address]    Report Status PENDING  Incomplete  Virus culture     Status: None   Collection Time: 11/30/17  2:50 PM  Result Value Ref Range Status   Viral Culture Comment  Final    Comment: (NOTE) Preliminary Report: No virus isolated at 24 hours. Next report to follow after 4 days. Performed At: [facility], [place] [id number] Rush Farmer MD WN:[id number]    Source of Sample BRONCHIAL ALVEOLAR LAVAGE  Final    Comment: Performed at [facility], [address] [place], [place] [address]    Coagulation Studies: Recent Labs    12/02/17 1150  LABPROT 13.8  INR 1.07    Urinalysis: Recent Labs    12/03/17 0741  COLORURINE YELLOW  LABSPEC 1.025  PHURINE 5.5  GLUCOSEU NEGATIVE  HGBUR SMALL*  BILIRUBINUR NEGATIVE  KETONESUR NEGATIVE  PROTEINUR >300*  NITRITE NEGATIVE  LEUKOCYTESUR NEGATIVE      Imaging: US Biopsy (kidney)  Result Date: 12/03/2017 CLINICAL DATA:  History of lupus with progressive renal insufficiency. Renal biopsy has been requested for nephropathologic assessment. EXAM: ULTRASOUND GUIDED CORE BIOPSY OF LEFT KIDNEY MEDICATIONS: 1.0 mg IV Versed; 50 mcg IV Fentanyl Total Moderate Sedation Time: 12 minutes. The patient's level of consciousness and physiologic status were continuously monitored during the procedure by Radiology nursing. PROCEDURE: The procedure, risks, benefits, and alternatives  were explained to the patient. Questions regarding the procedure were encouraged and answered. The patient understands and consents to the procedure. A time out was performed prior to initiating the procedure. Ultrasound was performed of both kidneys. The left was chosen for biopsy. The left flank region was prepped with chlorhexidine in a sterile fashion, and a sterile drape was applied covering the operative field. A sterile gown and sterile gloves were used for the procedure. Local anesthesia was provided with 1% Lidocaine. Under ultrasound guidance, a 16 gauge core needle biopsy device was utilized in obtaining 2 separate samples from lower pole cortex of the left kidney. Both samples were placed on saline soaked Telfa and submitted to Pathology. Additional ultrasound was performed of the left kidney after completion of biopsy. COMPLICATIONS: None. FINDINGS: The left kidney is better visualized by ultrasound and also demonstrated better thickness of lower pole cortex compared to the right in the prone position. Solid, intact core biopsy samples were obtained. IMPRESSION: Ultrasound-guided core biopsy performed of lower pole cortex of the left kidney. Two separate 16 gauge core biopsy samples were obtained. Electronically Signed   By: Aletta Edouard M.D.   On: 12/03/2017 12:32     Medications:   . sodium chloride    . sodium chloride     . butamben-tetracaine-benzocaine  1 spray Topical Once  . cholecalciferol  2,000 Units Oral Daily  . ferrous sulfate  325 mg Oral BID WC  . letrozole  2.5 mg Oral Daily  . lidocaine  1 application Topical Once  . methylPREDNISolone (SOLU-MEDROL) injection  60 mg Intravenous Q24H  . metoprolol tartrate  12.5 mg Oral BID  . multivitamin with minerals  1 tablet Oral Daily  . pantoprazole  40 mg Oral Daily  . polyethylene glycol  17 g Oral Daily  . potassium chloride  40 mEq Oral Once  . protein supplement shake  11 oz Oral BID BM  . sodium chloride flush  3 mL  Intravenous Q12H   sodium chloride, acetaminophen, albuterol, ALPRAZolam, alum & mag hydroxide-simeth, benzonatate, guaiFENesin, ipratropium-albuterol, loperamide, ondansetron (ZOFRAN) IV, phenylephrine, sodium chloride flush, triamcinolone cream  Assessment/ Plan:  52 y.o. female with hypertension, anemia, vitamin D deficiency, breast cancer status post left mastectomy, chemotherapy and radiation, beta thalasemia, pancytopenia.    1.  Acute renal failure with chronic kidney disease stage III with history of proteinuria, hematuria, and history of positive ANA.  Renal biopsy performed 12/03/17.  -BUN is up to 98 however suspect that this is secondary to steroid use.  However creatinine is down to 2.48.  Therefore no urgent indication for dialysis at the moment.  Continue to monitor renal parameters daily.  We are awaiting results of renal biopsy hopefully on Monday or Tuesday.  2.  Hypertension.  Maintain the patient on metoprolol for blood pressure control.    LOS: [place] 2/16/20193:41 PM

## 2017-12-04 NOTE — Progress Notes (Signed)
Patient ID: Melanie Holloway, female   DOB: 03-05-1966, 52 y.o.   MRN: [id number]  [facility] PROGRESS NOTE  Melanie Holloway CBS:[id number] DOB: [birth year] DOA: 11/18/2017 PCP: Ria Bush, MD  HPI/Subjective:   Afebrile today. No shortness of breath today no pain.  Off oxygen.  Objective: Vitals:   12/04/17 0406 12/04/17 0917  BP: 137/84 139/90  Pulse: 97 95  Resp: 16 18  Temp: (!) 97.4 F (36.3 C) 98.1 F (36.7 C)  SpO2: 98% 99%    Filed Weights   12/02/17 0500 12/03/17 0410 12/04/17 0500  Weight: 68.9 kg (151 lb 12.8 oz) 65.4 kg (144 lb 1.6 oz) 66.1 kg (145 lb 12.8 oz)    ROS: Review of Systems  Constitutional: Positive for malaise/fatigue. Negative for chills and fever.  HENT: Negative for congestion and sore throat.   Eyes: Negative for blurred vision.  Respiratory: Positive for cough, shortness of breath and wheezing.   Cardiovascular: Positive for orthopnea. Negative for chest pain.  Gastrointestinal: Positive for diarrhea. Negative for abdominal pain, constipation, nausea and vomiting.  Genitourinary: Negative for dysuria.  Musculoskeletal: Negative for joint pain.  Neurological: Negative for dizziness and headaches.   Exam: Physical Exam  HENT:  Nose: No mucosal edema.  Mouth/Throat: No oropharyngeal exudate or posterior oropharyngeal edema.  Eyes: Conjunctivae, EOM and lids are normal. Pupils are equal, round, and reactive to light.  Neck: No JVD present. Carotid bruit is not present. No edema present. No thyroid mass and no thyromegaly present.  Cardiovascular: S1 normal and S2 normal. Exam reveals no gallop.  No murmur heard. Pulses:      Dorsalis pedis pulses are 2+ on the right side, and 2+ on the left side.  Respiratory: No respiratory distress. She has no wheezes. She has no rhonchi.  GI: Soft. Bowel sounds are normal. There is no tenderness.  Musculoskeletal:       Right ankle: She exhibits swelling.       Left ankle: She exhibits swelling.   Lymphadenopathy:    She has no cervical adenopathy.  Neurological: She is alert. No cranial nerve deficit.  Skin: Skin is warm. No rash noted. Nails show no clubbing.  Psychiatric: She has a normal mood and affect.   Data Reviewed: Basic Metabolic Panel: Recent Labs  Lab 11/30/17 1231 12/01/17 0301 12/02/17 0602 12/03/17 0559 12/04/17 0324  NA 129* 128* 131* 129* 131*  K 3.4* 3.8 3.9 3.4* 3.2*  CL 101 100* 102 100* 102  CO2 18* 17* 18* 17* 18*  GLUCOSE 136* 165* 158* 182* 205*  BUN 56* 62* 75* 83* 98*  CREATININE 2.20* 2.35* 3.06* 2.95* 2.48*  CALCIUM 8.2* 8.4* 8.5* 8.6* 8.6*   CBC: Recent Labs  Lab 11/29/17 0306 11/30/17 1231 12/01/17 0301 12/02/17 0602 12/03/17 0559 12/04/17 0324  WBC 3.8 2.0* 3.4* 3.0* 2.9* 3.6  NEUTROABS 2.6 1.5 2.9 2.5  --  3.1  HGB 7.7* 6.9* 9.1* 9.0* 8.6* 8.9*  HCT 23.0* 20.8* 26.3* 25.9* 24.6* 25.5*  MCV 87.6 86.2 86.5 87.0 84.7 85.7  PLT 76* 108* 117* 99* 90* 113*   Cardiac Enzymes: Recent Labs  Lab 12/02/17 1428  CKTOTAL 24*   BNP (last 3 results) Recent Labs    11/18/17 1715 11/20/17 0405  BNP 2,975.0* 852.0*     CBG: No results for input(s): GLUCAP in the last 168 hours.  Recent Results (from the past 240 hour(s))  Gastrointestinal Panel by PCR , Stool     Status: None   Collection Time:  11/26/17  8:12 AM  Result Value Ref Range Status   Campylobacter species NOT DETECTED NOT DETECTED Final   Plesimonas shigelloides NOT DETECTED NOT DETECTED Final   Salmonella species NOT DETECTED NOT DETECTED Final   Yersinia enterocolitica NOT DETECTED NOT DETECTED Final   Vibrio species NOT DETECTED NOT DETECTED Final   Vibrio cholerae NOT DETECTED NOT DETECTED Final   Enteroaggregative E coli (EAEC) NOT DETECTED NOT DETECTED Final   Enteropathogenic E coli (EPEC) NOT DETECTED NOT DETECTED Final   Enterotoxigenic E coli (ETEC) NOT DETECTED NOT DETECTED Final   Shiga like toxin producing E coli (STEC) NOT DETECTED NOT DETECTED  Final   Shigella/Enteroinvasive E coli (EIEC) NOT DETECTED NOT DETECTED Final   Cryptosporidium NOT DETECTED NOT DETECTED Final   Cyclospora cayetanensis NOT DETECTED NOT DETECTED Final   Entamoeba histolytica NOT DETECTED NOT DETECTED Final   Giardia lamblia NOT DETECTED NOT DETECTED Final   Adenovirus F40/41 NOT DETECTED NOT DETECTED Final   Astrovirus NOT DETECTED NOT DETECTED Final   Norovirus GI/GII NOT DETECTED NOT DETECTED Final   Rotavirus A NOT DETECTED NOT DETECTED Final   Sapovirus (I, II, IV, and V) NOT DETECTED NOT DETECTED Final    Comment: Performed at [facility], [address] [place], [place] [address]  C difficile quick scan w PCR reflex     Status: None   Collection Time: 11/26/17  8:12 AM  Result Value Ref Range Status   C Diff antigen NEGATIVE NEGATIVE Final   C Diff toxin NEGATIVE NEGATIVE Final   C Diff interpretation No C. difficile detected.  Final    Comment: Performed at [facility], [address] [place], [place] [address]  CULTURE, BLOOD (ROUTINE X 2) w Reflex to ID Panel     Status: None   Collection Time: 11/27/17  8:58 AM  Result Value Ref Range Status   Specimen Description BLOOD RIGHT ANTECUBITAL  Final   Special Requests   Final    BOTTLES DRAWN AEROBIC AND ANAEROBIC Blood Culture adequate volume   Culture   Final    NO GROWTH 5 DAYS Performed at [facility], [address] [place], [place] [address]    Report Status 12/02/2017 FINAL  Final  CULTURE, BLOOD (ROUTINE X 2) w Reflex to ID Panel     Status: None   Collection Time: 11/27/17 11:03 AM  Result Value Ref Range Status   Specimen Description BLOOD RIGHT ANTECUBITAL  Final   Special Requests   Final    BOTTLES DRAWN AEROBIC AND ANAEROBIC Blood Culture adequate volume   Culture   Final    NO GROWTH 5 DAYS Performed at [facility], [address] [place], [place] [address]    Report Status 12/02/2017 FINAL  Final  Respiratory Panel by PCR      Status: None   Collection Time: 11/29/17  5:30 PM  Result Value Ref Range Status   Adenovirus NOT DETECTED NOT DETECTED Final   Coronavirus 229E NOT DETECTED NOT DETECTED Final   Coronavirus HKU1 NOT DETECTED NOT DETECTED Final   Coronavirus NL63 NOT DETECTED NOT DETECTED Final   Coronavirus OC43 NOT DETECTED NOT DETECTED Final   Metapneumovirus NOT DETECTED NOT DETECTED Final   Rhinovirus / Enterovirus NOT DETECTED NOT DETECTED Final   Influenza A NOT DETECTED NOT DETECTED Final   Influenza A H1 NOT DETECTED NOT DETECTED Final   Influenza A H1 2009 NOT DETECTED NOT DETECTED Final   Influenza A  H3 NOT DETECTED NOT DETECTED Final   Influenza B NOT DETECTED NOT DETECTED Final   Parainfluenza Virus 1 NOT DETECTED NOT DETECTED Final   Parainfluenza Virus 2 NOT DETECTED NOT DETECTED Final   Parainfluenza Virus 3 NOT DETECTED NOT DETECTED Final   Parainfluenza Virus 4 NOT DETECTED NOT DETECTED Final   Respiratory Syncytial Virus NOT DETECTED NOT DETECTED Final   Bordetella pertussis NOT DETECTED NOT DETECTED Final   Chlamydophila pneumoniae NOT DETECTED NOT DETECTED Final   Mycoplasma pneumoniae NOT DETECTED NOT DETECTED Final    Comment: Performed at [facility], [place] [address] [place], [place] [address]  Culture, expectorated sputum-assessment     Status: None   Collection Time: 11/29/17  9:35 PM  Result Value Ref Range Status   Specimen Description SPUTUM  Final   Special Requests NONE  Final   Sputum evaluation   Final    THIS SPECIMEN IS ACCEPTABLE FOR SPUTUM CULTURE Performed at [facility], [address] [place], [place] [address]    Report Status 11/30/2017 FINAL  Final  Culture, respiratory (NON-Expectorated)     Status: None   Collection Time: 11/29/17  9:35 PM  Result Value Ref Range Status   Specimen Description   Final    SPUTUM Performed at [facility], [address] [place], [place] [address]    Special Requests   Final    NONE  Reflexed from [phone] Performed at [facility], [address] [place], [place] [address]    Gram Stain   Final    RARE WBC PRESENT, PREDOMINANTLY PMN FEW YEAST RARE GRAM POSITIVE COCCI IN PAIRS Performed at [facility], [place] [address], [place], [place] [address]    Culture FEW CANDIDA ALBICANS  Final   Report Status 12/03/2017 FINAL  Final  Culture, bal-quantitative     Status: None (Preliminary result)   Collection Time: 11/30/17  2:50 PM  Result Value Ref Range Status   Specimen Description   Final    Bronch Lavag Performed at [facility], [address], [place], [place] [address]    Special Requests   Final    Immunocompromised Performed at [facility], [address] [place], [place] [address]    Gram Stain   Final    RARE WBC PRESENT, PREDOMINANTLY PMN NO ORGANISMS SEEN    Culture   Final    CULTURE REINCUBATED FOR BETTER GROWTH Performed at [facility], [place] [address] [place], [place] [address]    Report Status PENDING  Incomplete  Acid Fast Smear (AFB)     Status: None   Collection Time: 11/30/17  2:50 PM  Result Value Ref Range Status   AFB Specimen Processing Concentration  Final   Acid Fast Smear Negative  Final    Comment: (NOTE) Performed At: [facility] [address] [place], [place] [id number] Rush Farmer MD TK:[id number]    Source (AFB) BRONCHIAL ALVEOLAR LAVAGE  Final    Comment: Performed at [facility], [address] [place], [place] [address]  Culture, fungus without smear     Status: None (Preliminary result)   Collection Time: 11/30/17  2:50 PM  Result Value Ref Range Status   Specimen Description   Final    BRONCHIAL ALVEOLAR LAVAGE Performed at [facility], [address] [place], [place] [address]    Special Requests   Final    Immunocompromised Performed at [facility], [address] [place], [place] [address]    Culture   Final     YEAST CULTURE REINCUBATED FOR BETTER GROWTH Performed at [facility], [place] [address], [place], [place] [address]    Report Status PENDING  Incomplete  Virus culture     Status: None   Collection Time: 11/30/17  2:50 PM  Result Value Ref Range Status   Viral Culture Comment  Final    Comment: (NOTE) Preliminary Report: No virus isolated at 24 hours. Next report to follow after 4 days. Performed At: [facility] [place], [place] [id number] Rush Farmer MD DH:[id number]    Source of Sample BRONCHIAL ALVEOLAR LAVAGE  Final    Comment: Performed at [facility], [address] [place], [place] [address]      Scheduled Meds: . butamben-tetracaine-benzocaine  1 spray Topical Once  . cholecalciferol  2,000 Units Oral Daily  . ferrous sulfate  325 mg Oral BID WC  . letrozole  2.5 mg Oral Daily  . lidocaine  1 application Topical Once  . methylPREDNISolone (SOLU-MEDROL) injection  60 mg Intravenous Q24H  . metoprolol tartrate  12.5 mg Oral BID  . multivitamin with minerals  1 tablet Oral Daily  . pantoprazole  40 mg Oral Daily  . polyethylene glycol  17 g Oral Daily  . protein supplement shake  11 oz Oral BID BM  . sodium chloride flush  3 mL Intravenous Q12H   Continuous Infusions: . sodium chloride    . sodium chloride      Assessment/Plan:  * AKI Discussed with nephrology Dr. Holley Raring.  History of lupus. Renal ultrasound normal. 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 Kidney biopsy 12/03/2017. Biopsy results being. Discussed with Dr. Zollie Scale.  Will need treatment for lupus and patient once biopsy results available.  * HCAP. Fever has resolved Associated with Acute hypoxic resp failure  Discussed with Dr. Ola Spurr.    Patient finished course of IV Zosyn and azithromycin. Bronchoscopy 11/30/2017 Cx negative  * SLE Rheumatology f/u after discharge  * Acute systolic congestive heart failure with EF 45-50%.   Resolved  * Acute kidney injury on  chronic kidney disease stage III.   * Metastatic breast cancer. LN biopsy negative for breast cancer.  .  Patient also has a pericardial effusion and lymphadenopathy seen on the chest. Malignant? Could also be due to Lupus.  * Essential hypertension.  Blood pressure on the lower side.  Held meds  * Weakness.  Physical therapy recommended home health.  * Pancytopenia with cancer history - Appreciate oncology input.   Code Status:     Code Status Orders  (From admission, onward)        Start     Ordered   11/18/17 2216  Full code  Continuous     11/18/17 2217    Code Status History    Date Active Date Inactive Code Status Order ID Comments User Context   09/22/2017 03:26 09/22/2017 19:40 Full Code [id number]  Lance Coon, MD Inpatient      Time spent: 35 minutes  [facility]

## 2017-12-05 LAB — CULTURE, BAL-QUANTITATIVE W GRAM STAIN

## 2017-12-05 LAB — BASIC METABOLIC PANEL
ANION GAP: 8 (ref 5–15)
BUN: 98 mg/dL — ABNORMAL HIGH (ref 6–20)
CHLORIDE: 106 mmol/L (ref 101–111)
CO2: 18 mmol/L — ABNORMAL LOW (ref 22–32)
CREATININE: 2.01 mg/dL — AB (ref 0.44–1.00)
Calcium: 8.6 mg/dL — ABNORMAL LOW (ref 8.9–10.3)
GFR calc non Af Amer: 27 mL/min — ABNORMAL LOW (ref >60)
GFR, EST AFRICAN AMERICAN: 31 mL/min — AB (ref >60)
Glucose, Bld: 205 mg/dL — ABNORMAL HIGH (ref 65–99)
Potassium: 3.7 mmol/L (ref 3.5–5.1)
SODIUM: 132 mmol/L — AB (ref 135–145)

## 2017-12-05 LAB — CULTURE, BAL-QUANTITATIVE: CULTURE: NO GROWTH

## 2017-12-05 NOTE — Progress Notes (Signed)
[facility]  ROUNDING NOTE   Subjective:  Patient seen at bedside. Creatinine continues to trend down and is currently 2.01. BUN remains high secondary to steroid use.  Objective:  Vital signs in last 24 hours:  Temp:  [97.4 F (36.3 C)-98.2 F (36.8 C)] 97.6 F (36.4 C) (02/17 1049) Pulse Rate:  [86-89] 86 (02/17 1049) Resp:  [16-18] 16 (02/17 1049) BP: (134-148)/(85-100) 134/85 (02/17 1049) SpO2:  [99 %-100 %] 100 % (02/17 1049) Weight:  [66.4 kg (146 lb 6.2 oz)] 66.4 kg (146 lb 6.2 oz) (02/17 0513)  Weight change: 0.266 kg (9.4 oz) Filed Weights   12/03/17 0410 12/04/17 0500 12/05/17 0513  Weight: 65.4 kg (144 lb 1.6 oz) 66.1 kg (145 lb 12.8 oz) 66.4 kg (146 lb 6.2 oz)    Intake/Output: I/O last 3 completed shifts: In: 76 [P.O.:720] Out: -    Intake/Output this shift:  Total I/O In: 240 [P.O.:240] Out: -   Physical Exam: General: No acute distress  Head: Normocephalic, atraumatic. Moist oral mucosal membranes  Eyes: Anicteric  Neck: Supple, trachea midline  Lungs:  Scattered rhonchi, normal effort  Heart: S1S2 no rubs  Abdomen:  Soft, nontender, bowel sounds present  Extremities: trace peripheral edema.  Neurologic: Awake, alert, following commands  Skin: No lesions       Basic Metabolic Panel: Recent Labs  Lab 12/01/17 0301 12/02/17 0602 12/03/17 0559 12/04/17 0324 12/05/17 0353  NA 128* 131* 129* 131* 132*  K 3.8 3.9 3.4* 3.2* 3.7  CL 100* 102 100* 102 106  CO2 17* 18* 17* 18* 18*  GLUCOSE 165* 158* 182* 205* 205*  BUN 62* 75* 83* 98* 98*  CREATININE 2.35* 3.06* 2.95* 2.48* 2.01*  CALCIUM 8.4* 8.5* 8.6* 8.6* 8.6*    Liver Function Tests: Recent Labs  Lab 12/02/17 1428  AST 54*  ALT 30  ALKPHOS 37*  BILITOT 0.9  PROT 7.1  ALBUMIN 2.2*   No results for input(s): LIPASE, AMYLASE in the last 168 hours. No results for input(s): AMMONIA in the last 168 hours.  CBC: Recent Labs  Lab 11/29/17 0306 11/30/17 1231  12/01/17 0301 12/02/17 0602 12/03/17 0559 12/04/17 0324  WBC 3.8 2.0* 3.4* 3.0* 2.9* 3.6  NEUTROABS 2.6 1.5 2.9 2.5  --  3.1  HGB 7.7* 6.9* 9.1* 9.0* 8.6* 8.9*  HCT 23.0* 20.8* 26.3* 25.9* 24.6* 25.5*  MCV 87.6 86.2 86.5 87.0 84.7 85.7  PLT 76* 108* 117* 99* 90* 113*    Cardiac Enzymes: Recent Labs  Lab 12/02/17 1428  CKTOTAL 24*    BNP: Invalid input(s): POCBNP  CBG: No results for input(s): GLUCAP in the last 168 hours.  Microbiology: Results for orders placed or performed during the hospital encounter of 11/18/17  MRSA PCR Screening     Status: None   Collection Time: 11/18/17  9:50 PM  Result Value Ref Range Status   MRSA by PCR NEGATIVE NEGATIVE Final    Comment:        The GeneXpert MRSA Assay (FDA approved for NASAL specimens only), is one component of a comprehensive MRSA colonization surveillance program. It is not intended to diagnose MRSA infection nor to guide or monitor treatment for MRSA infections. Performed at [facility], [address] [place], [place] [address]   MRSA PCR Screening     Status: None   Collection Time: 11/23/17  5:41 PM  Result Value Ref Range Status   MRSA by PCR NEGATIVE NEGATIVE Final    Comment:  The GeneXpert MRSA Assay (FDA approved for NASAL specimens only), is one component of a comprehensive MRSA colonization surveillance program. It is not intended to diagnose MRSA infection nor to guide or monitor treatment for MRSA infections. Performed at [facility], [address] [place], [place] [address]   Gastrointestinal Panel by PCR , Stool     Status: None   Collection Time: 11/26/17  8:12 AM  Result Value Ref Range Status   Campylobacter species NOT DETECTED NOT DETECTED Final   Plesimonas shigelloides NOT DETECTED NOT DETECTED Final   Salmonella species NOT DETECTED NOT DETECTED Final   Yersinia enterocolitica NOT DETECTED NOT DETECTED Final   Vibrio species NOT DETECTED NOT  DETECTED Final   Vibrio cholerae NOT DETECTED NOT DETECTED Final   Enteroaggregative E coli (EAEC) NOT DETECTED NOT DETECTED Final   Enteropathogenic E coli (EPEC) NOT DETECTED NOT DETECTED Final   Enterotoxigenic E coli (ETEC) NOT DETECTED NOT DETECTED Final   Shiga like toxin producing E coli (STEC) NOT DETECTED NOT DETECTED Final   Shigella/Enteroinvasive E coli (EIEC) NOT DETECTED NOT DETECTED Final   Cryptosporidium NOT DETECTED NOT DETECTED Final   Cyclospora cayetanensis NOT DETECTED NOT DETECTED Final   Entamoeba histolytica NOT DETECTED NOT DETECTED Final   Giardia lamblia NOT DETECTED NOT DETECTED Final   Adenovirus F40/41 NOT DETECTED NOT DETECTED Final   Astrovirus NOT DETECTED NOT DETECTED Final   Norovirus GI/GII NOT DETECTED NOT DETECTED Final   Rotavirus A NOT DETECTED NOT DETECTED Final   Sapovirus (I, II, IV, and V) NOT DETECTED NOT DETECTED Final    Comment: Performed at [facility], [address] [place], [place] [address]  C difficile quick scan w PCR reflex     Status: None   Collection Time: 11/26/17  8:12 AM  Result Value Ref Range Status   C Diff antigen NEGATIVE NEGATIVE Final   C Diff toxin NEGATIVE NEGATIVE Final   C Diff interpretation No C. difficile detected.  Final    Comment: Performed at [facility], [address] [place], [place] [address]  CULTURE, BLOOD (ROUTINE X 2) w Reflex to ID Panel     Status: None   Collection Time: 11/27/17  8:58 AM  Result Value Ref Range Status   Specimen Description BLOOD RIGHT ANTECUBITAL  Final   Special Requests   Final    BOTTLES DRAWN AEROBIC AND ANAEROBIC Blood Culture adequate volume   Culture   Final    NO GROWTH 5 DAYS Performed at [facility], [address] [place], [place] [address]    Report Status 12/02/2017 FINAL  Final  CULTURE, BLOOD (ROUTINE X 2) w Reflex to ID Panel     Status: None   Collection Time: 11/27/17 11:03 AM  Result Value Ref Range Status    Specimen Description BLOOD RIGHT ANTECUBITAL  Final   Special Requests   Final    BOTTLES DRAWN AEROBIC AND ANAEROBIC Blood Culture adequate volume   Culture   Final    NO GROWTH 5 DAYS Performed at [facility], [address], [place], [place] [address]    Report Status 12/02/2017 FINAL  Final  Respiratory Panel by PCR     Status: None   Collection Time: 11/29/17  5:30 PM  Result Value Ref Range Status   Adenovirus NOT DETECTED NOT DETECTED Final   Coronavirus 229E NOT DETECTED NOT DETECTED Final   Coronavirus HKU1 NOT DETECTED NOT DETECTED Final   Coronavirus  NL63 NOT DETECTED NOT DETECTED Final   Coronavirus OC43 NOT DETECTED NOT DETECTED Final   Metapneumovirus NOT DETECTED NOT DETECTED Final   Rhinovirus / Enterovirus NOT DETECTED NOT DETECTED Final   Influenza A NOT DETECTED NOT DETECTED Final   Influenza A H1 NOT DETECTED NOT DETECTED Final   Influenza A H1 2009 NOT DETECTED NOT DETECTED Final   Influenza A H3 NOT DETECTED NOT DETECTED Final   Influenza B NOT DETECTED NOT DETECTED Final   Parainfluenza Virus 1 NOT DETECTED NOT DETECTED Final   Parainfluenza Virus 2 NOT DETECTED NOT DETECTED Final   Parainfluenza Virus 3 NOT DETECTED NOT DETECTED Final   Parainfluenza Virus 4 NOT DETECTED NOT DETECTED Final   Respiratory Syncytial Virus NOT DETECTED NOT DETECTED Final   Bordetella pertussis NOT DETECTED NOT DETECTED Final   Chlamydophila pneumoniae NOT DETECTED NOT DETECTED Final   Mycoplasma pneumoniae NOT DETECTED NOT DETECTED Final    Comment: Performed at [facility], [address], [place], [place] [address]  Culture, expectorated sputum-assessment     Status: None   Collection Time: 11/29/17  9:35 PM  Result Value Ref Range Status   Specimen Description SPUTUM  Final   Special Requests NONE  Final   Sputum evaluation   Final    THIS SPECIMEN IS ACCEPTABLE FOR SPUTUM CULTURE Performed at [facility], [address] [place], [place]  [address]    Report Status 11/30/2017 FINAL  Final  Culture, respiratory (NON-Expectorated)     Status: None   Collection Time: 11/29/17  9:35 PM  Result Value Ref Range Status   Specimen Description   Final    SPUTUM Performed at [facility], [address] [place], [place] [address]    Special Requests   Final    NONE Reflexed from [id number] Performed at [facility], [address] [place], [place] [address]    Gram Stain   Final    RARE WBC PRESENT, PREDOMINANTLY PMN FEW YEAST RARE GRAM POSITIVE COCCI IN PAIRS Performed at [facility], [address] [place], [place] [address]    Culture FEW CANDIDA ALBICANS  Final   Report Status 12/03/2017 FINAL  Final  Culture, bal-quantitative     Status: None   Collection Time: 11/30/17  2:50 PM  Result Value Ref Range Status   Specimen Description   Final    Bronch Lavag Performed at [facility], [address] [place], [place] [address]    Special Requests   Final    Immunocompromised Performed at [facility], [address] [place], [place] [address]    Gram Stain   Final    RARE WBC PRESENT, PREDOMINANTLY PMN NO ORGANISMS SEEN    Culture   Final    NO GROWTH Performed at [facility], [address], [place], [place] [address]    Report Status 12/05/2017 FINAL  Final  Acid Fast Smear (AFB)     Status: None   Collection Time: 11/30/17  2:50 PM  Result Value Ref Range Status   AFB Specimen Processing Concentration  Final   Acid Fast Smear Negative  Final    Comment: (NOTE) Performed At: [facility] [place], [place] [id number] Rush Farmer MD [id number]    Source (AFB) BRONCHIAL ALVEOLAR LAVAGE  Final    Comment: Performed at [facility], [address], [place], [place] [address]  Culture, fungus without smear  Status: Abnormal (Preliminary result)   Collection Time: 11/30/17  2:50 PM  Result Value Ref Range Status    Specimen Description   Final    BRONCHIAL ALVEOLAR LAVAGE Performed at [facility], [address] [place], [place] [address]    Special Requests   Final    Immunocompromised Performed at [facility], [address] [place], [place] [address]    Culture CANDIDA ALBICANS (A)  Final   Report Status PENDING  Incomplete  Virus culture     Status: None   Collection Time: 11/30/17  2:50 PM  Result Value Ref Range Status   Viral Culture Comment  Final    Comment: (NOTE) Preliminary Report: No virus isolated at 24 hours. Next report to follow after 4 days. Performed At: [facility] [place], [place] [id number] Rush Farmer MD [id number]    Source of Sample BRONCHIAL ALVEOLAR LAVAGE  Final    Comment: Performed at [facility], [address] [place], [place] [address]    Coagulation Studies: No results for input(s): LABPROT, INR in the last 72 hours.  Urinalysis: Recent Labs    12/03/17 0741  COLORURINE YELLOW  LABSPEC 1.025  PHURINE 5.5  GLUCOSEU NEGATIVE  HGBUR SMALL*  BILIRUBINUR NEGATIVE  KETONESUR NEGATIVE  PROTEINUR >300*  NITRITE NEGATIVE  LEUKOCYTESUR NEGATIVE      Imaging: No results found.   Medications:   . sodium chloride    . sodium chloride     . butamben-tetracaine-benzocaine  1 spray Topical Once  . cholecalciferol  2,000 Units Oral Daily  . ferrous sulfate  325 mg Oral BID WC  . letrozole  2.5 mg Oral Daily  . lidocaine  1 application Topical Once  . methylPREDNISolone (SOLU-MEDROL) injection  60 mg Intravenous Q24H  . metoprolol tartrate  12.5 mg Oral BID  . multivitamin with minerals  1 tablet Oral Daily  . pantoprazole  40 mg Oral Daily  . polyethylene glycol  17 g Oral Daily  . protein supplement shake  11 oz Oral BID BM  . sodium chloride flush  3 mL Intravenous Q12H   sodium chloride, acetaminophen, albuterol, ALPRAZolam, alum & mag hydroxide-simeth, benzonatate,  guaiFENesin, ipratropium-albuterol, loperamide, ondansetron (ZOFRAN) IV, phenylephrine, sodium chloride flush, triamcinolone cream  Assessment/ Plan:  52 y.o. female with hypertension, anemia, vitamin D deficiency, breast cancer status post left mastectomy, chemotherapy and radiation, beta thalasemia, pancytopenia.    1.  Acute renal failure with chronic kidney disease stage III with history of proteinuria, hematuria, and history of positive ANA.  Renal biopsy performed 12/03/17.  -BUN remains high at 98 however creatinine has trended down to 2.01.  EGFR currently 31.  She underwent renal biopsy on Friday.  Hopefully we will have a preliminary result on Monday or Tuesday.  Thereafter additional treatment can be considered.  2.  Hypertension.  Continue metoprolol 12.5 mg p.o. twice daily.    LOS: [place], Verner Mccrone 2/17/201912:42 PM

## 2017-12-05 NOTE — Progress Notes (Signed)
Patient ID: Melanie Holloway, female   DOB: Mar 03, 1966, 52 y.o.   MRN: [id number]  [facility] PROGRESS NOTE  Andreika Vandagriff JME:[id number] DOB: [birth year] DOA: 11/18/2017 PCP: Ria Bush, MD  HPI/Subjective:  Feels well.  Improving.  Still has some cough.  Afebrile.  Off oxygen.  Objective: Vitals:   12/05/17 0353 12/05/17 1049  BP: (!) 148/100 134/85  Pulse: 87 86  Resp: 16 16  Temp: 97.9 F (36.6 C) 97.6 F (36.4 C)  SpO2: 100% 100%    Filed Weights   12/03/17 0410 12/04/17 0500 12/05/17 0513  Weight: 65.4 kg (144 lb 1.6 oz) 66.1 kg (145 lb 12.8 oz) 66.4 kg (146 lb 6.2 oz)    ROS: Review of Systems  Constitutional: Positive for malaise/fatigue. Negative for chills and fever.  HENT: Negative for congestion and sore throat.   Eyes: Negative for blurred vision.  Respiratory: Positive for cough, shortness of breath and wheezing.   Cardiovascular: Positive for orthopnea. Negative for chest pain.  Gastrointestinal: Positive for diarrhea. Negative for abdominal pain, constipation, nausea and vomiting.  Genitourinary: Negative for dysuria.  Musculoskeletal: Negative for joint pain.  Neurological: Negative for dizziness and headaches.   Exam: Physical Exam  HENT:  Nose: No mucosal edema.  Mouth/Throat: No oropharyngeal exudate or posterior oropharyngeal edema.  Eyes: Conjunctivae, EOM and lids are normal. Pupils are equal, round, and reactive to light.  Neck: No JVD present. Carotid bruit is not present. No edema present. No thyroid mass and no thyromegaly present.  Cardiovascular: S1 normal and S2 normal. Exam reveals no gallop.  No murmur heard. Pulses:      Dorsalis pedis pulses are 2+ on the right side, and 2+ on the left side.  Respiratory: No respiratory distress. She has no wheezes. She has no rhonchi.  GI: Soft. Bowel sounds are normal. There is no tenderness.  Musculoskeletal:       Right ankle: She exhibits swelling.       Left ankle: She exhibits  swelling.  Lymphadenopathy:    She has no cervical adenopathy.  Neurological: She is alert. No cranial nerve deficit.  Skin: Skin is warm. No rash noted. Nails show no clubbing.  Psychiatric: She has a normal mood and affect.   Data Reviewed: Basic Metabolic Panel: Recent Labs  Lab 12/01/17 0301 12/02/17 0602 12/03/17 0559 12/04/17 0324 12/05/17 0353  NA 128* 131* 129* 131* 132*  K 3.8 3.9 3.4* 3.2* 3.7  CL 100* 102 100* 102 106  CO2 17* 18* 17* 18* 18*  GLUCOSE 165* 158* 182* 205* 205*  BUN 62* 75* 83* 98* 98*  CREATININE 2.35* 3.06* 2.95* 2.48* 2.01*  CALCIUM 8.4* 8.5* 8.6* 8.6* 8.6*   CBC: Recent Labs  Lab 11/29/17 0306 11/30/17 1231 12/01/17 0301 12/02/17 0602 12/03/17 0559 12/04/17 0324  WBC 3.8 2.0* 3.4* 3.0* 2.9* 3.6  NEUTROABS 2.6 1.5 2.9 2.5  --  3.1  HGB 7.7* 6.9* 9.1* 9.0* 8.6* 8.9*  HCT 23.0* 20.8* 26.3* 25.9* 24.6* 25.5*  MCV 87.6 86.2 86.5 87.0 84.7 85.7  PLT 76* 108* 117* 99* 90* 113*   Cardiac Enzymes: Recent Labs  Lab 12/02/17 1428  CKTOTAL 24*   BNP (last 3 results) Recent Labs    11/18/17 1715 11/20/17 0405  BNP 2,975.0* 852.0*     CBG: No results for input(s): GLUCAP in the last 168 hours.  Recent Results (from the past 240 hour(s))  Gastrointestinal Panel by PCR , Stool     Status: None   Collection  Time: 11/26/17  8:12 AM  Result Value Ref Range Status   Campylobacter species NOT DETECTED NOT DETECTED Final   Plesimonas shigelloides NOT DETECTED NOT DETECTED Final   Salmonella species NOT DETECTED NOT DETECTED Final   Yersinia enterocolitica NOT DETECTED NOT DETECTED Final   Vibrio species NOT DETECTED NOT DETECTED Final   Vibrio cholerae NOT DETECTED NOT DETECTED Final   Enteroaggregative E coli (EAEC) NOT DETECTED NOT DETECTED Final   Enteropathogenic E coli (EPEC) NOT DETECTED NOT DETECTED Final   Enterotoxigenic E coli (ETEC) NOT DETECTED NOT DETECTED Final   Shiga like toxin producing E coli (STEC) NOT DETECTED NOT  DETECTED Final   Shigella/Enteroinvasive E coli (EIEC) NOT DETECTED NOT DETECTED Final   Cryptosporidium NOT DETECTED NOT DETECTED Final   Cyclospora cayetanensis NOT DETECTED NOT DETECTED Final   Entamoeba histolytica NOT DETECTED NOT DETECTED Final   Giardia lamblia NOT DETECTED NOT DETECTED Final   Adenovirus F40/41 NOT DETECTED NOT DETECTED Final   Astrovirus NOT DETECTED NOT DETECTED Final   Norovirus GI/GII NOT DETECTED NOT DETECTED Final   Rotavirus A NOT DETECTED NOT DETECTED Final   Sapovirus (I, II, IV, and V) NOT DETECTED NOT DETECTED Final    Comment: Performed at [facility], [address] [place], [place] [address]  C difficile quick scan w PCR reflex     Status: None   Collection Time: 11/26/17  8:12 AM  Result Value Ref Range Status   C Diff antigen NEGATIVE NEGATIVE Final   C Diff toxin NEGATIVE NEGATIVE Final   C Diff interpretation No C. difficile detected.  Final    Comment: Performed at [facility], [address] [place], [place] [address]  CULTURE, BLOOD (ROUTINE X 2) w Reflex to ID Panel     Status: None   Collection Time: 11/27/17  8:58 AM  Result Value Ref Range Status   Specimen Description BLOOD RIGHT ANTECUBITAL  Final   Special Requests   Final    BOTTLES DRAWN AEROBIC AND ANAEROBIC Blood Culture adequate volume   Culture   Final    NO GROWTH 5 DAYS Performed at [facility], [address] [place], [place] [address]    Report Status 12/02/2017 FINAL  Final  CULTURE, BLOOD (ROUTINE X 2) w Reflex to ID Panel     Status: None   Collection Time: 11/27/17 11:03 AM  Result Value Ref Range Status   Specimen Description BLOOD RIGHT ANTECUBITAL  Final   Special Requests   Final    BOTTLES DRAWN AEROBIC AND ANAEROBIC Blood Culture adequate volume   Culture   Final    NO GROWTH 5 DAYS Performed at [facility], [address] [place], [place] [address]    Report Status 12/02/2017 FINAL  Final  Respiratory  Panel by PCR     Status: None   Collection Time: 11/29/17  5:30 PM  Result Value Ref Range Status   Adenovirus NOT DETECTED NOT DETECTED Final   Coronavirus 229E NOT DETECTED NOT DETECTED Final   Coronavirus HKU1 NOT DETECTED NOT DETECTED Final   Coronavirus NL63 NOT DETECTED NOT DETECTED Final   Coronavirus OC43 NOT DETECTED NOT DETECTED Final   Metapneumovirus NOT DETECTED NOT DETECTED Final   Rhinovirus / Enterovirus NOT DETECTED NOT DETECTED Final   Influenza A NOT DETECTED NOT DETECTED Final   Influenza A H1 NOT DETECTED NOT DETECTED Final   Influenza A H1 2009 NOT DETECTED NOT DETECTED Final   Influenza  A H3 NOT DETECTED NOT DETECTED Final   Influenza B NOT DETECTED NOT DETECTED Final   Parainfluenza Virus 1 NOT DETECTED NOT DETECTED Final   Parainfluenza Virus 2 NOT DETECTED NOT DETECTED Final   Parainfluenza Virus 3 NOT DETECTED NOT DETECTED Final   Parainfluenza Virus 4 NOT DETECTED NOT DETECTED Final   Respiratory Syncytial Virus NOT DETECTED NOT DETECTED Final   Bordetella pertussis NOT DETECTED NOT DETECTED Final   Chlamydophila pneumoniae NOT DETECTED NOT DETECTED Final   Mycoplasma pneumoniae NOT DETECTED NOT DETECTED Final    Comment: Performed at [facility], [place] [address], [place], [place] [address]  Culture, expectorated sputum-assessment     Status: None   Collection Time: 11/29/17  9:35 PM  Result Value Ref Range Status   Specimen Description SPUTUM  Final   Special Requests NONE  Final   Sputum evaluation   Final    THIS SPECIMEN IS ACCEPTABLE FOR SPUTUM CULTURE Performed at [facility], [address], [place], [place] [address]    Report Status 11/30/2017 FINAL  Final  Culture, respiratory (NON-Expectorated)     Status: None   Collection Time: 11/29/17  9:35 PM  Result Value Ref Range Status   Specimen Description   Final    SPUTUM Performed at [facility], [address], [place], [place] [address]    Special Requests    Final    NONE Reflexed from [id number] Performed at [facility], [address] [place], [place] [address]    Gram Stain   Final    RARE WBC PRESENT, PREDOMINANTLY PMN FEW YEAST RARE GRAM POSITIVE COCCI IN PAIRS Performed at [facility], [place] [address], [place], [place] [address]    Culture FEW CANDIDA ALBICANS  Final   Report Status 12/03/2017 FINAL  Final  Culture, bal-quantitative     Status: None   Collection Time: 11/30/17  2:50 PM  Result Value Ref Range Status   Specimen Description   Final    Bronch Lavag Performed at [facility], [address], [place], [place] [address]    Special Requests   Final    Immunocompromised Performed at [facility], [address] [place], [place] [address]    Gram Stain   Final    RARE WBC PRESENT, PREDOMINANTLY PMN NO ORGANISMS SEEN    Culture   Final    NO GROWTH Performed at [facility], [place] [address] [place], [place] [address]    Report Status 12/05/2017 FINAL  Final  Acid Fast Smear (AFB)     Status: None   Collection Time: 11/30/17  2:50 PM  Result Value Ref Range Status   AFB Specimen Processing Concentration  Final   Acid Fast Smear Negative  Final    Comment: (NOTE) Performed At: [facility] [place], [place] [id number] Rush Farmer MD [id number]    Source (AFB) BRONCHIAL ALVEOLAR LAVAGE  Final    Comment: Performed at [facility], [address] [place], [place] [address]  Culture, fungus without smear     Status: Abnormal (Preliminary result)   Collection Time: 11/30/17  2:50 PM  Result Value Ref Range Status   Specimen Description   Final    BRONCHIAL ALVEOLAR LAVAGE Performed at [facility], [address] [place], [place] [address]    Special Requests   Final    Immunocompromised Performed at [facility], [place],  [place] [address]    Culture CANDIDA ALBICANS (A)  Final   Report  Status PENDING  Incomplete  Virus culture     Status: None   Collection Time: 11/30/17  2:50 PM  Result Value Ref Range Status   Viral Culture Comment  Final    Comment: (NOTE) Preliminary Report: No virus isolated at 24 hours. Next report to follow after 4 days. Performed At: [facility] [place], [place] [id number] Rush Farmer MD [id number]    Source of Sample BRONCHIAL ALVEOLAR LAVAGE  Final    Comment: Performed at [facility], [address] [place],  [address]      Scheduled Meds: . butamben-tetracaine-benzocaine  1 spray Topical Once  . cholecalciferol  2,000 Units Oral Daily  . ferrous sulfate  325 mg Oral BID WC  . letrozole  2.5 mg Oral Daily  . lidocaine  1 application Topical Once  . methylPREDNISolone (SOLU-MEDROL) injection  60 mg Intravenous Q24H  . metoprolol tartrate  12.5 mg Oral BID  . multivitamin with minerals  1 tablet Oral Daily  . pantoprazole  40 mg Oral Daily  . polyethylene glycol  17 g Oral Daily  . protein supplement shake  11 oz Oral BID BM  . sodium chloride flush  3 mL Intravenous Q12H   Continuous Infusions: . sodium chloride    . sodium chloride      Assessment/Plan:  * AKI Renal ultrasound normal. 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 Kidney biopsy 12/03/2017. Biopsy results being. Discussed with Dr. Zollie Scale.   Further treatment plan as per biopsy results.  Hopefully results will be available tomorrow.  * HCAP. Fever has resolved Associated with Acute hypoxic resp failure  Discussed with Dr. Ola Spurr.    Patient finished course of IV Zosyn and azithromycin. Bronchoscopy 11/30/2017 Cx negative  * [facility] -Dr. Jefm Bryant has seen the patient in the hospital  * Acute systolic congestive heart failure with EF 45-50%.   Resolved  * Acute kidney injury on chronic kidney disease stage III.   * Metastatic breast cancer. LN biopsy negative for breast cancer.  .  Patient also has a pericardial  effusion and lymphadenopathy seen on the chest. Malignant? Could also be due to Lupus.  * Essential hypertension.  Blood pressure on the lower side.  Held meds  * Weakness.  Physical therapy recommended home health.  * Pancytopenia with cancer history - Appreciate oncology input.   Code Status:     Code Status Orders  (From admission, onward)        Start     Ordered   11/18/17 2216  Full code  Continuous     11/18/17 2217    Code Status History    Date Active Date Inactive Code Status Order ID Comments User Context   09/22/2017 03:26 09/22/2017 19:40 Full Code [id number]  Lance Coon, MD Inpatient      Time spent: 35 minutes  [facility]

## 2017-12-06 LAB — BASIC METABOLIC PANEL
Anion gap: 10 (ref 5–15)
BUN: 97 mg/dL — AB (ref 6–20)
CO2: 17 mmol/L — ABNORMAL LOW (ref 22–32)
CREATININE: 1.67 mg/dL — AB (ref 0.44–1.00)
Calcium: 8.7 mg/dL — ABNORMAL LOW (ref 8.9–10.3)
Chloride: 105 mmol/L (ref 101–111)
GFR calc Af Amer: 39 mL/min — ABNORMAL LOW (ref >60)
GFR, EST NON AFRICAN AMERICAN: 34 mL/min — AB (ref >60)
GLUCOSE: 187 mg/dL — AB (ref 65–99)
POTASSIUM: 3.7 mmol/L (ref 3.5–5.1)
SODIUM: 132 mmol/L — AB (ref 135–145)

## 2017-12-06 LAB — CBC WITH DIFFERENTIAL/PLATELET
BASOS ABS: 0 10*3/uL (ref 0–0.1)
Basophils Relative: 0 %
EOS ABS: 0 10*3/uL (ref 0–0.7)
EOS PCT: 0 %
HCT: 27.2 % — ABNORMAL LOW (ref 35.0–47.0)
Hemoglobin: 9.6 g/dL — ABNORMAL LOW (ref 12.0–16.0)
LYMPHS ABS: 0.8 10*3/uL — AB (ref 1.0–3.6)
LYMPHS PCT: 7 %
MCH: 30 pg (ref 26.0–34.0)
MCHC: 35.2 g/dL (ref 32.0–36.0)
MCV: 85.4 fL (ref 80.0–100.0)
MONO ABS: 0.6 10*3/uL (ref 0.2–0.9)
Monocytes Relative: 5 %
Neutro Abs: 10.3 10*3/uL — ABNORMAL HIGH (ref 1.4–6.5)
Neutrophils Relative %: 88 %
PLATELETS: 99 10*3/uL — AB (ref 150–440)
RBC: 3.19 MIL/uL — ABNORMAL LOW (ref 3.80–5.20)
RDW: 15.6 % — AB (ref 11.5–14.5)
WBC: 11.8 10*3/uL — AB (ref 3.6–11.0)

## 2017-12-06 LAB — GLUCOSE, CAPILLARY
GLUCOSE-CAPILLARY: 141 mg/dL — AB (ref 65–99)
Glucose-Capillary: 141 mg/dL — ABNORMAL HIGH (ref 65–99)

## 2017-12-06 MED ORDER — INSULIN ASPART 100 UNIT/ML ~~LOC~~ SOLN
0.0000 [IU] | Freq: Three times a day (TID) | SUBCUTANEOUS | Status: DC
  Administered 2017-12-06 – 2017-12-07 (×2): 1 [IU] via SUBCUTANEOUS
  Administered 2017-12-08: 09:00:00 2 [IU] via SUBCUTANEOUS
  Administered 2017-12-08: 3 [IU] via SUBCUTANEOUS
  Filled 2017-12-06 (×5): qty 1

## 2017-12-06 MED ORDER — NYSTATIN 100000 UNIT/ML MT SUSP
5.0000 mL | Freq: Four times a day (QID) | OROMUCOSAL | Status: DC
  Administered 2017-12-07 – 2017-12-08 (×5): 500000 [IU] via ORAL
  Filled 2017-12-06 (×5): qty 5

## 2017-12-06 MED ORDER — INSULIN ASPART 100 UNIT/ML ~~LOC~~ SOLN
0.0000 [IU] | Freq: Every day | SUBCUTANEOUS | Status: DC
  Administered 2017-12-07: 22:00:00 2 [IU] via SUBCUTANEOUS
  Filled 2017-12-06: qty 1

## 2017-12-06 NOTE — Progress Notes (Signed)
Patient notified of needing urine specimen. Madlyn Frankel, RN

## 2017-12-06 NOTE — Progress Notes (Signed)
Nutrition Follow-up  DOCUMENTATION CODES:   Not applicable  INTERVENTION:  Will liberalize diet to regular with restriction on potassium and phosphorus per MD. Patient may not actually need potassium or phosphorus restriction long-term pending renal function and result of kidney biopsy. Potassium is WNL and phosphorus was only elevated on 2/2.  Continue Premier Protein po BID, each supplement provides 160 kcal and 30 grams of protein.  Continue Magic cup TID with meals, each supplement provides 290 kcal and 9 grams of protein.  Continue daily MVI.  NUTRITION DIAGNOSIS:   Inadequate oral intake related to nausea, vomiting, poor appetite as evidenced by per patient/family report.  Ongoing - addressing with interventions.  GOAL:   Patient will meet greater than or equal to 90% of their needs  Progressing.  MONITOR:   PO intake, I & O's, Labs, Supplement acceptance, Weight trends  REASON FOR ASSESSMENT:   Malnutrition Screening Tool, Consult Diet education  ASSESSMENT:   Melanie Holloway is a 52 y.o. female  with lupus, beta thalassemia,  hypertension, anemia, vitamin D deficiency, breast cancer status post left mastectomy, chemotherapy and radiation, possible tumor recurrence, who presents with acute hypoxic respiratory failure, newly diagnosed systolic congestive heart failure exacerbation.   -Right supraclavicular biopsy from 11/22/2017 revealed no evidence of metastatic carcinoma. -Patient underwent bronchoscopy on 11/30/2017 and transbronchial left lower lobe forceps biopsies were taken. -Also underwent US guided core biopsy of left kidney on 2/15 in setting of worsening renal function. May be lupus nephritis. -Patient is now afebrile and off oxygen.  Met with patient at bedside. She reports her appetite remains about the same. Noted in [facility] patient has only been ordering about 1 meal per day here. She reports she does not like the food because it has no taste. She  is instead eating food brought in from outside. Reports if she eats from outside she may have rice with hamburger or chicken. She continues to drink the Premier Protein and eat the Magic Cups per her report. However, noted in chart she refused both bottles of Premier today. She prefers the chocolate flavor.  Medications reviewed and include: vitamin D 2000 units daily, ferrous sulfate 325 mg BID, Novolog 0-9 units TID, Novolog 0-5 units QHS, Solu-Medrol 60 mg Q24hrs IV, Lopressor, MVI daily, pantoprazole, Miralax 17 grams daily.  Labs reviewed: Sodium 132, Potassium WNL at 3.7, CO2 17, BUN 97, Creatinine 1.67. Last phosphorus was 6.4 on 2/2.  I/O: UOP is unmeasured  Weight trend: 66.4 kg on 12/05/2017; +1.7 kg from admission  Discussed with MD. Okay to liberalize to regular diet with potassium and phosphorus restrictions.  Diet Order:  Diet 2 gram sodium Room service appropriate? Yes; Fluid consistency: Thin  EDUCATION NEEDS:   Not appropriate for education at this time  Skin:  Skin Assessment: Skin Integrity Issues: Skin Integrity Issues:: Incisions Incisions: closed incisions to chest and back  Last BM:  12/04/2017 - large type 7  Height:   Ht Readings from Last 1 Encounters:  11/27/17 4' 11" (1.499 m)    Weight:   Wt Readings from Last 1 Encounters:  12/05/17 146 lb 6.2 oz (66.4 kg)    Ideal Body Weight:  44.7 kg  BMI:  Body mass index is 29.57 kg/m.  Estimated Nutritional Needs:   Kcal:  1500-1800kcal/day   Protein:  83-95g/day   Fluid:  per MD  Leanne Stephens, MS, RD, LDN Office: [phone] Pager: [phone] After Hours/Weekend Pager: [phone]  

## 2017-12-06 NOTE — Progress Notes (Signed)
[facility] Hematology/Oncology Progress Note  Date of admission: 11/18/2017  Hospital day:  12/06/2017   Chief Complaint: Melanie Holloway is a 52 y.o. female with a history of stage IIIA left breast cancer who was admitted through the emergency room with shortness of breath and acute respiratory failure.  Subjective:  Feeling better.  No fever.  Episode of confusion possibly related to Xanax.  Social History: The patient is accompanied by a friend/family member today.  Allergies: No Known Allergies  Scheduled Medications: . cholecalciferol  2,000 Units Oral Daily  . ferrous sulfate  325 mg Oral BID WC  . insulin aspart  0-5 Units Subcutaneous QHS  . insulin aspart  0-9 Units Subcutaneous TID WC  . letrozole  2.5 mg Oral Daily  . methylPREDNISolone (SOLU-MEDROL) injection  60 mg Intravenous Q24H  . metoprolol tartrate  12.5 mg Oral BID  . multivitamin with minerals  1 tablet Oral Daily  . [START ON 12/07/2017] nystatin  5 mL Oral QID  . pantoprazole  40 mg Oral Daily  . polyethylene glycol  17 g Oral Daily  . protein supplement shake  11 oz Oral BID BM  . sodium chloride flush  3 mL Intravenous Q12H    Review of Systems: GENERAL:  Feels better.  Fever resolved.  Weight loss (20 pounds in 3 months). PERFORMANCE STATUS (ECOG):  2 HEENT:  No visual changes, runny nose, sore throat, mouth sores or tenderness. Lungs: No shortness of breath.  Cough, improved.  Cardiac:  No chest pain, palpitations, orthopnea, or PND. GI: No nausea, diarrhea, constipation, melena or hematochezia. GU:  No urgency, frequency, dysuria, or hematuria. Musculoskeletal:  Joint pain (hands).  No back pain.  No muscle tenderness. Extremities:  No pain or swelling. Skin:  Rash on arm (chronic) s/p biopsy (lupus).  Photosensitivity.  No ulcers. Neuro:  No headache, numbness or weakness, balance or coordination issues. Endocrine:  No diabetes, thyroid issues, hot flashes or night sweats. Psych:   No anxiety or depression. Pain:  No focal pain. Review of systems:  All other systems reviewed and found to be negative  Physical Exam: Blood pressure 135/78, pulse (!) 109, temperature 99.6 F (37.6 C), temperature source Oral, resp. rate 18, height 4' 11"  (1.499 m), weight 146 lb 6.2 oz (66.4 kg), SpO2 100 %.  GENERAL:  Slightly fatigued appearing woman lying comfortably on the medical unit in no acute distress. MENTAL STATUS:  Alert and oriented to person, place and time. HEAD:  Wearing a cap.  Normocephalic, atraumatic, face symmetric, no Cushingoid features. EYES:  Brown eyes.  Pupils equal round and reactive to light and accomodation.  No conjunctivitis or scleral icterus. ENT:  Thrush.  Tongue normal.  Dentures.  Mucous membranes dry.  RESPIRATORY:  Faint lower lobe crackles.  No wheezes or rhonchi. CARDIOVASCULAR:  Regular rate and rhythm without murmur, rub or gallop. ABDOMEN:  Soft, non-tender, with active bowel sounds, and no appreciable hepatosplenomegaly.  No masses. SKIN:  Brown speckled upper extremities with slight erythema.  No ulcers or lesions. EXTREMITIES: No edema, no skin discoloration or tenderness.  No palpable cords. NEUROLOGICAL: Appropriate. PSYCH:  Appropriate.   Results for orders placed or performed during the hospital encounter of 11/18/17 (from the past 48 hour(s))  Basic metabolic panel     Status: Abnormal   Collection Time: 12/05/17  3:53 AM  Result Value Ref Range   Sodium 132 (L) 135 - 145 mmol/L   Potassium 3.7 3.5 - 5.1 mmol/L  Chloride 106 101 - 111 mmol/L   CO2 18 (L) 22 - 32 mmol/L   Glucose, Bld 205 (H) 65 - 99 mg/dL   BUN 98 (H) 6 - 20 mg/dL   Creatinine, Ser 2.01 (H) 0.44 - 1.00 mg/dL   Calcium 8.6 (L) 8.9 - 10.3 mg/dL   GFR calc non Af Amer 27 (L) >60 mL/min   GFR calc Af Amer 31 (L) >60 mL/min    Comment: (NOTE) The eGFR has been calculated using the CKD EPI equation. This calculation has not been validated in all clinical  situations. eGFR's persistently <60 mL/min signify possible Chronic Kidney Disease.    Anion gap 8 5 - 15    Comment: Performed at [facility], [address] [place], [place] [address]  Basic metabolic panel     Status: Abnormal   Collection Time: 12/06/17  4:08 AM  Result Value Ref Range   Sodium 132 (L) 135 - 145 mmol/L   Potassium 3.7 3.5 - 5.1 mmol/L   Chloride 105 101 - 111 mmol/L   CO2 17 (L) 22 - 32 mmol/L   Glucose, Bld 187 (H) 65 - 99 mg/dL   BUN 97 (H) 6 - 20 mg/dL    Comment: RESULT CONFIRMED BY MANUAL DILUTION...[place]   Creatinine, Ser 1.67 (H) 0.44 - 1.00 mg/dL   Calcium 8.7 (L) 8.9 - 10.3 mg/dL   GFR calc non Af Amer 34 (L) >60 mL/min   GFR calc Af Amer 39 (L) >60 mL/min    Comment: (NOTE) The eGFR has been calculated using the CKD EPI equation. This calculation has not been validated in all clinical situations. eGFR's persistently <60 mL/min signify possible Chronic Kidney Disease.    Anion gap 10 5 - 15    Comment: Performed at [facility], [address] [place], [place] [address]  CBC with Differential/Platelet     Status: Abnormal   Collection Time: 12/06/17  4:08 AM  Result Value Ref Range   WBC 11.8 (H) 3.6 - 11.0 K/uL   RBC 3.19 (L) 3.80 - 5.20 MIL/uL   Hemoglobin 9.6 (L) 12.0 - 16.0 g/dL   HCT 27.2 (L) 35.0 - 47.0 %   MCV 85.4 80.0 - 100.0 fL   MCH 30.0 26.0 - 34.0 pg   MCHC 35.2 32.0 - 36.0 g/dL   RDW 15.6 (H) 11.5 - 14.5 %   Platelets 99 (L) 150 - 440 K/uL   Neutrophils Relative % 88 %   Neutro Abs 10.3 (H) 1.4 - 6.5 K/uL   Lymphocytes Relative 7 %   Lymphs Abs 0.8 (L) 1.0 - 3.6 K/uL   Monocytes Relative 5 %   Monocytes Absolute 0.6 0.2 - 0.9 K/uL   Eosinophils Relative 0 %   Eosinophils Absolute 0.0 0 - 0.7 K/uL   Basophils Relative 0 %   Basophils Absolute 0.0 0 - 0.1 K/uL    Comment: Performed at [facility], [address] [place], [place] [address]  Glucose, capillary     Status: Abnormal   Collection  Time: 12/06/17  4:48 PM  Result Value Ref Range   Glucose-Capillary 141 (H) 65 - 99 mg/dL  Glucose, capillary     Status: Abnormal   Collection Time: 12/06/17  8:01 PM  Result Value Ref Range   Glucose-Capillary 141 (H) 65 - 99 mg/dL   No results found.  Assessment:  Minyon Billiter is a 52 y.o. female with woman with a history of stage IIIA left  breast cancer in 2009 s/p neoadjuvant chemotherapy followed by mastectomy, axillary node dissection, radiation and 5 years of hormonal therapy.  CA27.29 was normal on 09/13/2012  She was noted to have a right breast mass in 05/2017.  Biopsy of a right axillary lymph node on 05/21/2017 revealed reactive lymphoid hyperplasia.  Chest, abdomen, and pelvic CT on 11/04/2017 revealed enlarged right-sided intramammary lymph nodes and extensive right axillary and supraclavicular adenopathy. A discrete right breast mass  was not identified.  There was mediastinal and hilar lymphadenopathy.  There was a nodular interstitial process in the lungs which could be interstitial spread of tumor, severe bronchitis or interstitial pneumonitis.  There was stable abdominal and pelvic lymphadenopathy.  There was sclerotic bone metastasis in the manubrium and T8 vertebral body. There were lytic lesions in L2 and L5.  Bone scan on 11/04/2017 revealed abnormal radiotracer activity in the right manubrium and T8 vertebral body compatible with the sclerotic metastatic disease.  There was no other osseous metastatic disease   Right supraclavicular biopsy on 11/22/2017 revealed no evidence of metastatic carcinoma.  There was no definitive evidence of a B cell lymphoma by morphology or flow cytometry.  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  She developed pancytopenia on 09/08/2017.  Normal studies in 08/2017 included: LDH, B12, folate, and ferritin.  Rheumatoid factor and TSH were normal on 03/07/2015.  ACE level was on 01/16/2013.  Hepatitis B surface antibody on 09/22/2017.  Retic was 2.4% on 11/08/2017.  PT was 15 (INR 1.19) on 11/22/2017.  Patient has a warm autoantibody.  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  Positive studies include: ANA > 1:1280 (speckled) on 09/08/2017.  Beta2 microglobin 9.1 on 09/08/2017.   Echo on 11/11/2017 revealed an EF of 45-50% with a moderate to large pericardial effusion.   She has progressive renal insufficiency.  Renal ultrasound on 12/01/2017 was normal. Renal biopsy on 12/03/2017 is pending.  Symptomatically, she remains fatigued.  She has lost 20 pounds in 3 months.  She has had on/off drenching sweats x < 1 year.  She is afebrile on steroids.    Plan:   1.  Oncology:  Stage IIIA left breast cancer s/p treatment.  Restaging studies in 10/2017 concerning for metastatic disease.  Supraclavicular biopsy on 11/22/2017 was negative.  Will need to pursue additional biopsy.  Discussions at tumor board reviewed. Will revisit plan for axillary biopsy in outpatient department.  CA27.29 is 53.7 (slightly elevated).  2.  Hematology:  Patient with beta-thalassemia.  Pancytopenia since 08/2017 felt possibly secondary to SLE  Patient has a high titer ANA (> 1:1280) and skin biopsy + lupus.  Patient has a negative lupus anticoagulant panel, but low titer beta 2 glycoprotein and anticardiolipin antibody. Some component of consumption with fever/infection.   Patient has a warm autoantibody and thus may have  autoimmune hemolytic anemia and autoimmune thrombocytopenia (ITP).  Patient began Solumedrol 60 mg IV daily on 11/29/2017.  She received 1 units PRBCs on 11/30/2017.  Counts improving.  ACE level is normal.  Hepatitis B core antibody, hepatitis C antibody, HIV antibody are negative.  Uric acid is normal.  LDH is elevated.  3.  Infectious disease:  Patient's fever resolved off antibiotics, but on steroids.  Completed course of azithromycin and Zosyn.   Extensive work-up underway (mycoplasma serology,  Legionella, urine strept pneumonia, fungal serology, fungal culture, AFB, quantiferon gold, upper respiratory PCR, viral culture).  Nystatin swish and spit started for thrush.  Appreciate ID  consult.  4.  Pulmonary:  Patient with nodular interstitial process in lungs of unclear etiology unclear.  Patient s/p bronchoscopy on 11/30/2017.  Cytology is negative (no evidence of malignancy).  Culture negative to date.  5.  Renal:  Creatinine improving (3.06 to 2.95 to 2.28 to 2.41.to 1.67).  Renal ultrasound negative.  Concern for lupus.  Awaiting result from renal biopsy on 12/03/2017.  Appreciate nephrology consult.  6.  Rheumatology:  Patient meets criteria for lupus.  Prior positive RNP.  Anti-DS DNA is negative.  ENA is positive (ribonucleic protein > 8.0 and ENA SM Ab Ser-aCnc > 8.0).  Complement (C3, C4) are low.   Appreciate rheumatology consult.   Call if any questions.  Will sign off.   Lequita Asal, MD  12/06/2017 , 6:37 PM

## 2017-12-06 NOTE — Progress Notes (Signed)
[place] INFECTIOUS DISEASE PROGRESS NOTE Date of Admission:  11/18/2017     ID: Melanie Holloway is a 52 y.o. female with fevers, LAN  Active Problems:   Respiratory failure (HCC)  Subjective: Remains afebrile. Renal bxp pending.  Off O2   ROS  Eleven systems are reviewed and negative except per hpi  Medications:  Antibiotics Given (last 72 hours)    None     . cholecalciferol  2,000 Units Oral Daily  . ferrous sulfate  325 mg Oral BID WC  . insulin aspart  0-5 Units Subcutaneous QHS  . insulin aspart  0-9 Units Subcutaneous TID WC  . letrozole  2.5 mg Oral Daily  . methylPREDNISolone (SOLU-MEDROL) injection  60 mg Intravenous Q24H  . metoprolol tartrate  12.5 mg Oral BID  . multivitamin with minerals  1 tablet Oral Daily  . nystatin  5 mL Oral QID  . pantoprazole  40 mg Oral Daily  . polyethylene glycol  17 g Oral Daily  . protein supplement shake  11 oz Oral BID BM  . sodium chloride flush  3 mL Intravenous Q12H    Objective: Vital signs in last 24 hours: Temp:  [97.5 F (36.4 C)-99.6 F (37.6 C)] 97.5 F (36.4 C) (02/19 1128) Pulse Rate:  [85-109] 85 (02/19 1128) Resp:  [18-24] 24 (02/19 1128) BP: (124-135)/(78-85) 124/78 (02/19 1128) SpO2:  [99 %-100 %] 100 % (02/19 1128) Weight:  [67.9 kg (149 lb 12.8 oz)] 67.9 kg (149 lb 12.8 oz) (02/19 0432) Constitutional:  oriented to person, place, and time. Mildly ill appearing, on O2 via [place] HENT: [place]/AT, PERRLA, no scleral icterus Mouth/Throat: Oropharynx is clear and dry . No oropharyngeal exudate.  Cardiovascular: Tachy, gallop Pulmonary/Chest: poor air movement, rhonchi throughout Neck = supple, no nuchal rigidity Abdominal: Soft. Bowel sounds are normal.  exhibits no distension. There is no tenderness.  Lymphadenopathy: no cervical adenopathy. No axillary adenopathy Neurological: alert and oriented to person, place, and time.  Skin: Skin is warm and dry. No rash noted. No erythema.  Ext 1+ edema bil  LE Psychiatric: a normal mood and affect.  behavior is normal.    Lab Results Recent Labs    12/06/17 0408 12/07/17 0535  WBC 11.8*  --   HGB 9.6*  --   HCT 27.2*  --   NA 132* 132*  K 3.7 3.9  CL 105 106  CO2 17* 16*  BUN 97* 92*  CREATININE 1.67* 1.52*    Microbiology: Results for orders placed or performed during the hospital encounter of 11/18/17  MRSA PCR Screening     Status: None   Collection Time: 11/18/17  9:50 PM  Result Value Ref Range Status   MRSA by PCR NEGATIVE NEGATIVE Final    Comment:        The GeneXpert MRSA Assay (FDA approved for NASAL specimens only), is one component of a comprehensive MRSA colonization surveillance program. It is not intended to diagnose MRSA infection nor to guide or monitor treatment for MRSA infections. Performed at [facility], [address] [place], [place] [address]   MRSA PCR Screening     Status: None   Collection Time: 11/23/17  5:41 PM  Result Value Ref Range Status   MRSA by PCR NEGATIVE NEGATIVE Final    Comment:        The GeneXpert MRSA Assay (FDA approved for NASAL specimens only), is one component of a comprehensive MRSA colonization surveillance program. It is not intended to  diagnose MRSA infection nor to guide or monitor treatment for MRSA infections. Performed at [facility], [address] [place], [place] [address]   Gastrointestinal Panel by PCR , Stool     Status: None   Collection Time: 11/26/17  8:12 AM  Result Value Ref Range Status   Campylobacter species NOT DETECTED NOT DETECTED Final   Plesimonas shigelloides NOT DETECTED NOT DETECTED Final   Salmonella species NOT DETECTED NOT DETECTED Final   Yersinia enterocolitica NOT DETECTED NOT DETECTED Final   Vibrio species NOT DETECTED NOT DETECTED Final   Vibrio cholerae NOT DETECTED NOT DETECTED Final   Enteroaggregative E coli (EAEC) NOT DETECTED NOT DETECTED Final   Enteropathogenic E coli (EPEC) NOT DETECTED  NOT DETECTED Final   Enterotoxigenic E coli (ETEC) NOT DETECTED NOT DETECTED Final   Shiga like toxin producing E coli (STEC) NOT DETECTED NOT DETECTED Final   Shigella/Enteroinvasive E coli (EIEC) NOT DETECTED NOT DETECTED Final   Cryptosporidium NOT DETECTED NOT DETECTED Final   Cyclospora cayetanensis NOT DETECTED NOT DETECTED Final   Entamoeba histolytica NOT DETECTED NOT DETECTED Final   Giardia lamblia NOT DETECTED NOT DETECTED Final   Adenovirus F40/41 NOT DETECTED NOT DETECTED Final   Astrovirus NOT DETECTED NOT DETECTED Final   Norovirus GI/GII NOT DETECTED NOT DETECTED Final   Rotavirus A NOT DETECTED NOT DETECTED Final   Sapovirus (I, II, IV, and V) NOT DETECTED NOT DETECTED Final    Comment: Performed at [facility], [address] [place], [place]  [address]  C difficile quick scan w PCR reflex     Status: None   Collection Time: 11/26/17  8:12 AM  Result Value Ref Range Status   C Diff antigen NEGATIVE NEGATIVE Final   C Diff toxin NEGATIVE NEGATIVE Final   C Diff interpretation No C. difficile detected.  Final    Comment: Performed at [facility], [address] [place], [place] [address]  CULTURE, BLOOD (ROUTINE X 2) w Reflex to ID Panel     Status: None   Collection Time: 11/27/17  8:58 AM  Result Value Ref Range Status   Specimen Description BLOOD RIGHT ANTECUBITAL  Final   Special Requests   Final    BOTTLES DRAWN AEROBIC AND ANAEROBIC Blood Culture adequate volume   Culture   Final    NO GROWTH 5 DAYS Performed at [facility], [address] [place], [place] [address]    Report Status 12/02/2017 FINAL  Final  CULTURE, BLOOD (ROUTINE X 2) w Reflex to ID Panel     Status: None   Collection Time: 11/27/17 11:03 AM  Result Value Ref Range Status   Specimen Description BLOOD RIGHT ANTECUBITAL  Final   Special Requests   Final    BOTTLES DRAWN AEROBIC AND ANAEROBIC Blood Culture adequate volume   Culture   Final    NO GROWTH 5  DAYS Performed at [facility], [address] [place], [place] [address]    Report Status 12/02/2017 FINAL  Final  Respiratory Panel by PCR     Status: None   Collection Time: 11/29/17  5:30 PM  Result Value Ref Range Status   Adenovirus NOT DETECTED NOT DETECTED Final   Coronavirus 229E NOT DETECTED NOT DETECTED Final   Coronavirus HKU1 NOT DETECTED NOT DETECTED Final   Coronavirus NL63 NOT DETECTED NOT DETECTED Final   Coronavirus OC43 NOT DETECTED NOT DETECTED Final   Metapneumovirus NOT DETECTED NOT DETECTED Final  Rhinovirus / Enterovirus NOT DETECTED NOT DETECTED Final   Influenza A NOT DETECTED NOT DETECTED Final   Influenza A H1 NOT DETECTED NOT DETECTED Final   Influenza A H1 2009 NOT DETECTED NOT DETECTED Final   Influenza A H3 NOT DETECTED NOT DETECTED Final   Influenza B NOT DETECTED NOT DETECTED Final   Parainfluenza Virus 1 NOT DETECTED NOT DETECTED Final   Parainfluenza Virus 2 NOT DETECTED NOT DETECTED Final   Parainfluenza Virus 3 NOT DETECTED NOT DETECTED Final   Parainfluenza Virus 4 NOT DETECTED NOT DETECTED Final   Respiratory Syncytial Virus NOT DETECTED NOT DETECTED Final   Bordetella pertussis NOT DETECTED NOT DETECTED Final   Chlamydophila pneumoniae NOT DETECTED NOT DETECTED Final   Mycoplasma pneumoniae NOT DETECTED NOT DETECTED Final    Comment: Performed at [facility], [place] [address] [place], [place] [address]  Culture, expectorated sputum-assessment     Status: None   Collection Time: 11/29/17  9:35 PM  Result Value Ref Range Status   Specimen Description SPUTUM  Final   Special Requests NONE  Final   Sputum evaluation   Final    THIS SPECIMEN IS ACCEPTABLE FOR SPUTUM CULTURE Performed at [facility], [address] [place], [place] [address]    Report Status 11/30/2017 FINAL  Final  Culture, respiratory (NON-Expectorated)     Status: None   Collection Time: 11/29/17  9:35 PM  Result Value Ref Range Status    Specimen Description   Final    SPUTUM Performed at [facility], [address] [place], [place] [address]    Special Requests   Final    NONE Reflexed from [id number] Performed at [facility], [address] [place], [place] [address]    Gram Stain   Final    RARE WBC PRESENT, PREDOMINANTLY PMN FEW YEAST RARE GRAM POSITIVE COCCI IN PAIRS Performed at [facility], [place] [address] [place], [place] [address]    Culture FEW CANDIDA ALBICANS  Final   Report Status 12/03/2017 FINAL  Final  Culture, bal-quantitative     Status: None   Collection Time: 11/30/17  2:50 PM  Result Value Ref Range Status   Specimen Description   Final    Bronch Lavag Performed at [facility], [address] [place], [place] [address]    Special Requests   Final    Immunocompromised Performed at [facility], [address] [place], [place] [address]    Gram Stain   Final    RARE WBC PRESENT, PREDOMINANTLY PMN NO ORGANISMS SEEN    Culture   Final    NO GROWTH Performed at [facility], [place] [address], [place], [place] [address]    Report Status 12/05/2017 FINAL  Final  Acid Fast Smear (AFB)     Status: None   Collection Time: 11/30/17  2:50 PM  Result Value Ref Range Status   AFB Specimen Processing Concentration  Final   Acid Fast Smear Negative  Final    Comment: (NOTE) Performed At: [facility] [place], [place] [id number] Rush Farmer MD [id number]    Source (AFB) BRONCHIAL ALVEOLAR LAVAGE  Final    Comment: Performed at [facility], [address] [place],  [address]  Culture, fungus without smear     Status: Abnormal (Preliminary result)   Collection Time: 11/30/17  2:50 PM  Result Value Ref Range Status   Specimen Description  Final    BRONCHIAL ALVEOLAR LAVAGE Performed at [facility], [address] [place], [place] [address]    Special Requests   Final     Immunocompromised Performed at [facility], [address] [place], [place] [address]    Culture CANDIDA ALBICANS (A)  Final   Report Status PENDING  Incomplete  Virus culture     Status: None   Collection Time: 11/30/17  2:50 PM  Result Value Ref Range Status   Viral Culture Comment  Final    Comment: (NOTE) Preliminary Report: No virus isolated at 4 days.  Next report to follow after 7 days. Performed At: [facility] [place], [place] [id number] Rush Farmer MD TA:[id number]    Source of Sample BRONCHIAL ALVEOLAR LAVAGE  Final    Comment: Performed at [facility], [address] [place], [place] [address]    Studies/Results: No results found.  Assessment/Plan: Melanie Holloway is a 52 y.o. female with hx distant Breast cancer, now being worked up by otpt oncology for lymphadenopathy (hilar, mediastinal, R intramammary and  R [place] and Axillary) as well as pericardial effusion, progressive pulmonary process. She also has pancytopenia and now fevers.  She also has hx of lupus erythematosus, ANA > 1:1280 , but is not followed by rheumatology.   She is progressively ill over last several months with cough she reports beginning in Dec, night sweats, wt loss of 30-40 #s.  Differential dx is broad but I favor malignant or rheumatological process.  So far bcx neg, stool PCR,  C diff neg. Sputum cx pending. MRSA PCR neg.  Flu PCR neg x 2.  Resp PCR neg, ACE nml. Urine Lg neg, mycoplasma IgM neg.  2/12 -  Remains febrile, getting bronch. Repeat CT shows contined ground glass and nodular infiltrates. 2/13 remains febrile. Started solumedrol 2/12. Bronch studies P, GS with GPC. Cr up to 2.3. On 2L O2- sats stable.  Walking around now, some improvement.  Pending - Mycoplasma serology, Anca, antigens for legionella urine strep Pna, fungal serology,  2/14 - seen by renal and rheum- for renal bxp. Fevers improving - ? Related to prednisone effect.  2/15 -  no fevers since 2 /12. Cultures all negative except a yeast on BAL and candida albicans on Sputum cx. Candida does not cause an impressive PNA as she has so suspect all oral contaminant.ESR 129, marked increased IGG levels.  2/18 - no further fevers. Off zosyn since 2/15  Recommendations No further fevers and off O2 so no further abx Continue workup and treatment of likely lupus.  Will repeat cxr just for follow up  Thank you very much for the consult. Will follow with you.  Leonel Ramsay   12/07/2017, 2:46 PM

## 2017-12-06 NOTE — Progress Notes (Signed)
[facility]  ROUNDING NOTE   Subjective:  Patient seen at bedside. Creatinine continues to trend down and is currently 1.67 BUN remains high secondary to steroid use.  Objective:  Vital signs in last 24 hours:  Temp:  [97.3 F (36.3 C)-98 F (36.7 C)] 97.3 F (36.3 C) (02/18 0426) Pulse Rate:  [80-93] 93 (02/18 1106) Resp:  [14-15] 14 (02/18 0426) BP: (142-153)/(86-99) 142/86 (02/18 1028) SpO2:  [99 %-100 %] 99 % (02/18 1106)  Weight change:  Filed Weights   12/03/17 0410 12/04/17 0500 12/05/17 0513  Weight: 65.4 kg (144 lb 1.6 oz) 66.1 kg (145 lb 12.8 oz) 66.4 kg (146 lb 6.2 oz)    Intake/Output: I/O last 3 completed shifts: In: 480 [P.O.:480] Out: -    Intake/Output this shift:  No intake/output data recorded.  Physical Exam: General: No acute distress  Head: Normocephalic, atraumatic. Moist oral mucosal membranes  Eyes: Anicteric  Neck: Supple, trachea midline  Lungs:  Scattered rhonchi, normal effort  Heart: S1S2 no rubs  Abdomen:  Soft, nontender, bowel sounds present  Extremities: trace peripheral edema.  Neurologic: Awake, alert, following commands  Skin: No lesions       Basic Metabolic Panel: Recent Labs  Lab 12/02/17 0602 12/03/17 0559 12/04/17 0324 12/05/17 0353 12/06/17 0408  NA 131* 129* 131* 132* 132*  K 3.9 3.4* 3.2* 3.7 3.7  CL 102 100* 102 106 105  CO2 18* 17* 18* 18* 17*  GLUCOSE 158* 182* 205* 205* 187*  BUN 75* 83* 98* 98* 97*  CREATININE 3.06* 2.95* 2.48* 2.01* 1.67*  CALCIUM 8.5* 8.6* 8.6* 8.6* 8.7*    Liver Function Tests: Recent Labs  Lab 12/02/17 1428  AST 54*  ALT 30  ALKPHOS 37*  BILITOT 0.9  PROT 7.1  ALBUMIN 2.2*   No results for input(s): LIPASE, AMYLASE in the last 168 hours. No results for input(s): AMMONIA in the last 168 hours.  CBC: Recent Labs  Lab 11/30/17 1231 12/01/17 0301 12/02/17 0602 12/03/17 0559 12/04/17 0324 12/06/17 0408  WBC 2.0* 3.4* 3.0* 2.9* 3.6 11.8*  NEUTROABS 1.5  2.9 2.5  --  3.1 10.3*  HGB 6.9* 9.1* 9.0* 8.6* 8.9* 9.6*  HCT 20.8* 26.3* 25.9* 24.6* 25.5* 27.2*  MCV 86.2 86.5 87.0 84.7 85.7 85.4  PLT 108* 117* 99* 90* 113* 99*    Cardiac Enzymes: Recent Labs  Lab 12/02/17 1428  CKTOTAL 24*    BNP: Invalid input(s): POCBNP  CBG: No results for input(s): GLUCAP in the last 168 hours.  Microbiology: Results for orders placed or performed during the hospital encounter of 11/18/17  MRSA PCR Screening     Status: None   Collection Time: 11/18/17  9:50 PM  Result Value Ref Range Status   MRSA by PCR NEGATIVE NEGATIVE Final    Comment:        The GeneXpert MRSA Assay (FDA approved for NASAL specimens only), is one component of a comprehensive MRSA colonization surveillance program. It is not intended to diagnose MRSA infection nor to guide or monitor treatment for MRSA infections. Performed at [facility], [address] [place], [place] [address]   MRSA PCR Screening     Status: None   Collection Time: 11/23/17  5:41 PM  Result Value Ref Range Status   MRSA by PCR NEGATIVE NEGATIVE Final    Comment:        The GeneXpert MRSA Assay (FDA approved for NASAL specimens only), is one component of a comprehensive MRSA  colonization surveillance program. It is not intended to diagnose MRSA infection nor to guide or monitor treatment for MRSA infections. Performed at [facility], [address] [place], [place] [address]   Gastrointestinal Panel by PCR , Stool     Status: None   Collection Time: 11/26/17  8:12 AM  Result Value Ref Range Status   Campylobacter species NOT DETECTED NOT DETECTED Final   Plesimonas shigelloides NOT DETECTED NOT DETECTED Final   Salmonella species NOT DETECTED NOT DETECTED Final   Yersinia enterocolitica NOT DETECTED NOT DETECTED Final   Vibrio species NOT DETECTED NOT DETECTED Final   Vibrio cholerae NOT DETECTED NOT DETECTED Final   Enteroaggregative E coli (EAEC) NOT DETECTED  NOT DETECTED Final   Enteropathogenic E coli (EPEC) NOT DETECTED NOT DETECTED Final   Enterotoxigenic E coli (ETEC) NOT DETECTED NOT DETECTED Final   Shiga like toxin producing E coli (STEC) NOT DETECTED NOT DETECTED Final   Shigella/Enteroinvasive E coli (EIEC) NOT DETECTED NOT DETECTED Final   Cryptosporidium NOT DETECTED NOT DETECTED Final   Cyclospora cayetanensis NOT DETECTED NOT DETECTED Final   Entamoeba histolytica NOT DETECTED NOT DETECTED Final   Giardia lamblia NOT DETECTED NOT DETECTED Final   Adenovirus F40/41 NOT DETECTED NOT DETECTED Final   Astrovirus NOT DETECTED NOT DETECTED Final   Norovirus GI/GII NOT DETECTED NOT DETECTED Final   Rotavirus A NOT DETECTED NOT DETECTED Final   Sapovirus (I, II, IV, and V) NOT DETECTED NOT DETECTED Final    Comment: Performed at [facility], [address] [place], [place] [address]  C difficile quick scan w PCR reflex     Status: None   Collection Time: 11/26/17  8:12 AM  Result Value Ref Range Status   C Diff antigen NEGATIVE NEGATIVE Final   C Diff toxin NEGATIVE NEGATIVE Final   C Diff interpretation No C. difficile detected.  Final    Comment: Performed at [facility], [address] [place], [place] [address]  CULTURE, BLOOD (ROUTINE X 2) w Reflex to ID Panel     Status: None   Collection Time: 11/27/17  8:58 AM  Result Value Ref Range Status   Specimen Description BLOOD RIGHT ANTECUBITAL  Final   Special Requests   Final    BOTTLES DRAWN AEROBIC AND ANAEROBIC Blood Culture adequate volume   Culture   Final    NO GROWTH 5 DAYS Performed at [facility], [address] [place], [place] [address]    Report Status 12/02/2017 FINAL  Final  CULTURE, BLOOD (ROUTINE X 2) w Reflex to ID Panel     Status: None   Collection Time: 11/27/17 11:03 AM  Result Value Ref Range Status   Specimen Description BLOOD RIGHT ANTECUBITAL  Final   Special Requests   Final    BOTTLES DRAWN AEROBIC AND ANAEROBIC  Blood Culture adequate volume   Culture   Final    NO GROWTH 5 DAYS Performed at [facility], [address], [place], [place] [address]    Report Status 12/02/2017 FINAL  Final  Respiratory Panel by PCR     Status: None   Collection Time: 11/29/17  5:30 PM  Result Value Ref Range Status   Adenovirus NOT DETECTED NOT DETECTED Final   Coronavirus 229E NOT DETECTED NOT DETECTED Final   Coronavirus HKU1 NOT DETECTED NOT DETECTED Final   Coronavirus NL63 NOT DETECTED NOT DETECTED Final   Coronavirus OC43 NOT DETECTED NOT DETECTED Final  Metapneumovirus NOT DETECTED NOT DETECTED Final   Rhinovirus / Enterovirus NOT DETECTED NOT DETECTED Final   Influenza A NOT DETECTED NOT DETECTED Final   Influenza A H1 NOT DETECTED NOT DETECTED Final   Influenza A H1 2009 NOT DETECTED NOT DETECTED Final   Influenza A H3 NOT DETECTED NOT DETECTED Final   Influenza B NOT DETECTED NOT DETECTED Final   Parainfluenza Virus 1 NOT DETECTED NOT DETECTED Final   Parainfluenza Virus 2 NOT DETECTED NOT DETECTED Final   Parainfluenza Virus 3 NOT DETECTED NOT DETECTED Final   Parainfluenza Virus 4 NOT DETECTED NOT DETECTED Final   Respiratory Syncytial Virus NOT DETECTED NOT DETECTED Final   Bordetella pertussis NOT DETECTED NOT DETECTED Final   Chlamydophila pneumoniae NOT DETECTED NOT DETECTED Final   Mycoplasma pneumoniae NOT DETECTED NOT DETECTED Final    Comment: Performed at [facility], [place] [address] [place] [address] [place], [place] [address]  Culture, expectorated sputum-assessment     Status: None   Collection Time: 11/29/17  9:35 PM  Result Value Ref Range Status   Specimen Description SPUTUM  Final   Special Requests NONE  Final   Sputum evaluation   Final    THIS SPECIMEN IS ACCEPTABLE FOR SPUTUM CULTURE Performed at [facility], [address] [place], [place] [address]    Report Status 11/30/2017 FINAL  Final  Culture, respiratory (NON-Expectorated)     Status: None    Collection Time: 11/29/17  9:35 PM  Result Value Ref Range Status   Specimen Description   Final    SPUTUM Performed at [facility], [address] [place], [place] [address]    Special Requests   Final    NONE Reflexed from [phone] Performed at [facility], [address] [place], [place] [address]    Gram Stain   Final    RARE WBC PRESENT, PREDOMINANTLY PMN FEW YEAST RARE GRAM POSITIVE COCCI IN PAIRS Performed at [facility], [address], [place], [place] [address]    Culture FEW CANDIDA ALBICANS  Final   Report Status 12/03/2017 FINAL  Final  Culture, bal-quantitative     Status: None   Collection Time: 11/30/17  2:50 PM  Result Value Ref Range Status   Specimen Description   Final    Bronch Lavag Performed at [facility], [address], [place], [place] [address]    Special Requests   Final    Immunocompromised Performed at [facility], [address] [place], [place] [address]    Gram Stain   Final    RARE WBC PRESENT, PREDOMINANTLY PMN NO ORGANISMS SEEN    Culture   Final    NO GROWTH Performed at [facility], [address] [place], [place] [address]    Report Status 12/05/2017 FINAL  Final  Acid Fast Smear (AFB)     Status: None   Collection Time: 11/30/17  2:50 PM  Result Value Ref Range Status   AFB Specimen Processing Concentration  Final   Acid Fast Smear Negative  Final    Comment: (NOTE) Performed At: [facility] [place], [place] [id number] Rush Farmer MD DG:[id number]    Source (AFB) BRONCHIAL ALVEOLAR LAVAGE  Final    Comment: Performed at [facility], [address] [place], [place] [address]  Culture, fungus without smear     Status: Abnormal (Preliminary result)   Collection Time: 11/30/17  2:50 PM  Result Value Ref  Range Status   Specimen Description   Final    BRONCHIAL ALVEOLAR LAVAGE Performed at [facility], [address] [place], [place] [address]    Special Requests   Final    Immunocompromised Performed at [facility], [address] [place], [place] [address]    Culture CANDIDA ALBICANS (A)  Final   Report Status PENDING  Incomplete  Virus culture     Status: None   Collection Time: 11/30/17  2:50 PM  Result Value Ref Range Status   Viral Culture Comment  Final    Comment: (NOTE) Preliminary Report: No virus isolated at 4 days.  Next report to follow after 7 days. Performed At: [facility] [place], [place] [id number] Rush Farmer MD ZP:[id number]    Source of Sample BRONCHIAL ALVEOLAR LAVAGE  Final    Comment: Performed at [facility], [address] [place], [place] [address]    Coagulation Studies: No results for input(s): LABPROT, INR in the last 72 hours.  Urinalysis: No results for input(s): COLORURINE, LABSPEC, PHURINE, GLUCOSEU, HGBUR, BILIRUBINUR, KETONESUR, PROTEINUR, UROBILINOGEN, NITRITE, LEUKOCYTESUR in the last 72 hours.  Invalid input(s): APPERANCEUR    Imaging: No results found.   Medications:   . sodium chloride     . cholecalciferol  2,000 Units Oral Daily  . ferrous sulfate  325 mg Oral BID WC  . insulin aspart  0-5 Units Subcutaneous QHS  . insulin aspart  0-9 Units Subcutaneous TID WC  . letrozole  2.5 mg Oral Daily  . methylPREDNISolone (SOLU-MEDROL) injection  60 mg Intravenous Q24H  . metoprolol tartrate  12.5 mg Oral BID  . multivitamin with minerals  1 tablet Oral Daily  . pantoprazole  40 mg Oral Daily  . polyethylene glycol  17 g Oral Daily  . protein supplement shake  11 oz Oral BID BM  . sodium chloride flush  3 mL Intravenous Q12H   sodium chloride, acetaminophen, ALPRAZolam, alum & mag hydroxide-simeth, benzonatate, guaiFENesin, ipratropium-albuterol, loperamide, ondansetron (ZOFRAN) IV, phenylephrine, sodium chloride flush, triamcinolone cream  Assessment/ Plan:  52 y.o. female with  hypertension, anemia, vitamin D deficiency, breast cancer status post left mastectomy, chemotherapy and radiation, beta thalasemia, pancytopenia.    1.  Acute renal failure with chronic kidney disease stage III with history of proteinuria, hematuria, and history of positive ANA.  Renal biopsy performed 12/03/17.  -BUN remains high at 97 however creatinine has trended down to 1.67.  EGFR currently 39.  She underwent renal biopsy on Friday.  Hopefully we will have a preliminary result soon.  Thereafter additional treatment can be considered.  2.  Hypertension.  Continue metoprolol 12.5 mg p.o. twice daily. Avoiding ACE-I at present due to fluctuating creatinine     LOS: 18 Melanie Holloway 2/18/20194:05 PM

## 2017-12-06 NOTE — Progress Notes (Signed)
Inpatient Diabetes Program Recommendations  AACE/ADA: New Consensus Statement on Inpatient Glycemic Control (2015)  Target Ranges:  Prepandial:   less than 140 mg/dL      Peak postprandial:   less than 180 mg/dL (1-2 hours)      Critically ill patients:  140 - 180 mg/dL  Results for ANTONINA, DEZIEL (MRN [id number]) as of 12/06/2017 09:00  Ref. Range 12/02/2017 06:02 12/03/2017 05:59 12/04/2017 03:24 12/05/2017 03:53 12/06/2017 04:08  Glucose Latest Ref Range: 65 - 99 mg/dL 158 (H) 182 (H) 205 (H) 205 (H) 187 (H)    Review of Glycemic Control  Diabetes history: No Outpatient Diabetes medications: NA Current orders for Inpatient glycemic control: None  Inpatient Diabetes Program Recommendations: Correction (SSI): While inpatient and ordered steroids, please consider ordering CBGs with Novolog correction scale ACHS.  Thanks, Barnie Alderman, RN, MSN, CDE Diabetes Coordinator Inpatient Diabetes Program [phone] (Team Pager from 8am to 5pm)

## 2017-12-06 NOTE — Progress Notes (Signed)
Pt calling out-confused, hears refrigerators being moved-called her son-reassurance given, xanax given per son and patient request-son in route to hospital

## 2017-12-06 NOTE — Progress Notes (Signed)
Physical Therapy Treatment Patient Details Name: Melanie Holloway MRN: [id number] DOB: [birth year] Today's Date: 12/06/2017    History of Present Illness Pt is a 52 y.o. female with a known history of right breast cancer with metastases/probable tumor recurrence, anemia-status post 2 packed red blood cells transfusion within the last week, beta thalassemia, lupus, presenting with 1-2-week history of worsening breath, worse with recumbency, was seen by primary care provider 3 days ago-prescribed doxycycline/prednisone, no improvement, echocardiogram done recently noted for ejection fraction 45-50%, in the emergency room patient tachycardic, tachypneic, hypertensive, chest x-ray noted for cardiomegaly with edema, troponin 0.1, CT chest noted for progression of interstitial disease/airspace disease for which could be related to infection/inflammation/edema/hemorrhage-CT report nonhelpful, patient evaluated in the emergency room, husband at the bedside, patient resting comfortably on BiPAP, noted Rales up to mid back bilaterally with bilateral lower extremity edema, worse with recumbency, positive orthopnea, patient is now been admitted for acute hypoxic respiratory failure and acute newly diagnosed systolic congestive heart failure exacerbation most likely exacerbated by recent blood transfusion.    PT Comments    Pt agreeable to PT; denies pain. Pt demonstrating Min guard A primarily for transfers although 2 episodes from commode of sudden anterior shift requiring Min A. Pt ambulates steadily with slow pace (1.25 ft/sec). Post ambulation pt complains of stiff/tight ankles, L > R. Pt instructed in ankle range of motion all planes and encouraged performance throughout the day to improve. Pt refused up in chair and returned to bed post session. Continue PT to progress endurance, strength to improve all functional mobility.    Follow Up Recommendations  Home health PT     Equipment Recommendations  Rolling  walker with 5" wheels    Recommendations for Other Services       Precautions / Restrictions Precautions Precautions: Fall Restrictions Weight Bearing Restrictions: No    Mobility  Bed Mobility Overal bed mobility: Modified Independent             General bed mobility comments: Extremely slow  Transfers Overall transfer level: Needs assistance Equipment used: Rolling walker (2 wheeled) Transfers: Sit to/from Stand Sit to Stand: Min guard;Min assist         General transfer comment: From bed and BSC; occasional need for Min A due to sudden anterior shift forward with STS from [facility] x 2  Ambulation/Gait Ambulation/Gait assistance: Min guard Ambulation Distance (Feet): 150 Feet Assistive device: Rolling walker (2 wheeled) Gait Pattern/deviations: Step-through pattern   Gait velocity interpretation: (1.25 ft/sec) General Gait Details: slow, but steady. Some slow processing with manuevering around obstacles.    Stairs            Wheelchair Mobility    Modified Rankin (Stroke Patients Only)       Balance Overall balance assessment: Needs assistance Sitting-balance support: Bilateral upper extremity supported;Feet supported Sitting balance-Leahy Scale: Fair     Standing balance support: Bilateral upper extremity supported Standing balance-Leahy Scale: Fair                              Cognition Arousal/Alertness: Awake/alert Behavior During Therapy: WFL for tasks assessed/performed;Flat affect Overall Cognitive Status: Within Functional Limits for tasks assessed                                 General Comments: slow processing/problem solving      Exercises Other Exercises Other  Exercises: ankle range of motion all planes x 10.  Other Exercises: Min guard and Min A for toileting/hygiene    General Comments        Pertinent Vitals/Pain Pain Assessment: No/denies pain    Home Living                       Prior Function            PT Goals (current goals can now be found in the care plan section) Progress towards PT goals: Progressing toward goals(slowly)    Frequency    Min 2X/week      PT Plan Current plan remains appropriate    Co-evaluation              AM-PAC PT "6 Clicks" Daily Activity  Outcome Measure  Difficulty turning over in bed (including adjusting bedclothes, sheets and blankets)?: A Little Difficulty moving from lying on back to sitting on the side of the bed? : A Little Difficulty sitting down on and standing up from a chair with arms (e.g., wheelchair, bedside commode, etc,.)?: Unable Help needed moving to and from a bed to chair (including a wheelchair)?: A Little Help needed walking in hospital room?: A Little Help needed climbing 3-5 steps with a railing? : A Lot 6 Click Score: 15    End of Session Equipment Utilized During Treatment: Gait belt Activity Tolerance: Patient limited by fatigue;Patient tolerated treatment well Patient left: in bed;with call bell/phone within reach;with family/visitor present(refused alarm)   PT Visit Diagnosis: Unsteadiness on feet (R26.81);Muscle weakness (generalized) (M62.81)     Time: 2542-7062 PT Time Calculation (min) (ACUTE ONLY): 30 min  Charges:  $Gait Training: 8-22 mins $Therapeutic Activity: 8-22 mins                    G CodesLarae Grooms, PTA 12/06/2017, 12:37 PM

## 2017-12-06 NOTE — Progress Notes (Signed)
Patient ID: Melanie Holloway, female   DOB: 1965/11/05, 52 y.o.   MRN: [id number]  [facility] [employer] PROGRESS NOTE  Melanie Holloway TFT:[id number] DOB: 06-01-1966 DOA: 11/18/2017 PCP: Ria Bush, MD  HPI/Subjective:  Feels well.  Improving.  Still has some cough.  Afebrile.  Off oxygen.  Objective: Vitals:   12/06/17 1028 12/06/17 1106  BP: (!) 142/86   Pulse: 84 93  Resp:    Temp:    SpO2:  99%    Filed Weights   12/03/17 0410 12/04/17 0500 12/05/17 0513  Weight: 65.4 kg (144 lb 1.6 oz) 66.1 kg (145 lb 12.8 oz) 66.4 kg (146 lb 6.2 oz)    ROS: Review of Systems  Constitutional: Positive for malaise/fatigue. Negative for chills and fever.  HENT: Negative for congestion and sore throat.   Eyes: Negative for blurred vision.  Respiratory: Positive for cough, shortness of breath and wheezing.   Cardiovascular: Positive for orthopnea. Negative for chest pain.  Gastrointestinal: Positive for diarrhea. Negative for abdominal pain, constipation, nausea and vomiting.  Genitourinary: Negative for dysuria.  Musculoskeletal: Negative for joint pain.  Neurological: Negative for dizziness and headaches.   Exam: Physical Exam  HENT:  Nose: No mucosal edema.  Mouth/Throat: No oropharyngeal exudate or posterior oropharyngeal edema.  Eyes: Conjunctivae, EOM and lids are normal. Pupils are equal, round, and reactive to light.  Neck: No JVD present. Carotid bruit is not present. No edema present. No thyroid mass and no thyromegaly present.  Cardiovascular: S1 normal and S2 normal. Exam reveals no gallop.  No murmur heard. Pulses:      Dorsalis pedis pulses are 2+ on the right side, and 2+ on the left side.  Respiratory: No respiratory distress. She has no wheezes. She has no rhonchi.  GI: Soft. Bowel sounds are normal. There is no tenderness.  Musculoskeletal:       Right ankle: She exhibits swelling.       Left ankle: She exhibits swelling.  Lymphadenopathy:    She has no cervical  adenopathy.  Neurological: She is alert. No cranial nerve deficit.  Skin: Skin is warm. No rash noted. Nails show no clubbing.  Psychiatric: She has a normal mood and affect.   Data Reviewed: Basic Metabolic Panel: Recent Labs  Lab 12/02/17 0602 12/03/17 0559 12/04/17 0324 12/05/17 0353 12/06/17 0408  NA 131* 129* 131* 132* 132*  K 3.9 3.4* 3.2* 3.7 3.7  CL 102 100* 102 106 105  CO2 18* 17* 18* 18* 17*  GLUCOSE 158* 182* 205* 205* 187*  BUN 75* 83* 98* 98* 97*  CREATININE 3.06* 2.95* 2.48* 2.01* 1.67*  CALCIUM 8.5* 8.6* 8.6* 8.6* 8.7*   CBC: Recent Labs  Lab 11/30/17 1231 12/01/17 0301 12/02/17 0602 12/03/17 0559 12/04/17 0324 12/06/17 0408  WBC 2.0* 3.4* 3.0* 2.9* 3.6 11.8*  NEUTROABS 1.5 2.9 2.5  --  3.1 10.3*  HGB 6.9* 9.1* 9.0* 8.6* 8.9* 9.6*  HCT 20.8* 26.3* 25.9* 24.6* 25.5* 27.2*  MCV 86.2 86.5 87.0 84.7 85.7 85.4  PLT 108* 117* 99* 90* 113* 99*   Cardiac Enzymes: Recent Labs  Lab 12/02/17 1428  CKTOTAL 24*   BNP (last 3 results) Recent Labs    11/18/17 1715 11/20/17 0405  BNP 2,975.0* 852.0*     CBG: No results for input(s): GLUCAP in the last 168 hours.  Recent Results (from the past 240 hour(s))  CULTURE, BLOOD (ROUTINE X 2) w Reflex to ID Panel     Status: None   Collection Time: 11/27/17  8:58 AM  Result Value Ref Range Status   Specimen Description BLOOD RIGHT ANTECUBITAL  Final   Special Requests   Final    BOTTLES DRAWN AEROBIC AND ANAEROBIC Blood Culture adequate volume   Culture   Final    NO GROWTH 5 DAYS Performed at [facility], [address] [place], [place] [address]    Report Status 12/02/2017 FINAL  Final  CULTURE, BLOOD (ROUTINE X 2) w Reflex to ID Panel     Status: None   Collection Time: 11/27/17 11:03 AM  Result Value Ref Range Status   Specimen Description BLOOD RIGHT ANTECUBITAL  Final   Special Requests   Final    BOTTLES DRAWN AEROBIC AND ANAEROBIC Blood Culture adequate volume   Culture   Final     NO GROWTH 5 DAYS Performed at [facility], [address] [place], [place] [address]    Report Status 12/02/2017 FINAL  Final  Respiratory Panel by PCR     Status: None   Collection Time: 11/29/17  5:30 PM  Result Value Ref Range Status   Adenovirus NOT DETECTED NOT DETECTED Final   Coronavirus 229E NOT DETECTED NOT DETECTED Final   Coronavirus HKU1 NOT DETECTED NOT DETECTED Final   Coronavirus NL63 NOT DETECTED NOT DETECTED Final   Coronavirus OC43 NOT DETECTED NOT DETECTED Final   Metapneumovirus NOT DETECTED NOT DETECTED Final   Rhinovirus / Enterovirus NOT DETECTED NOT DETECTED Final   Influenza A NOT DETECTED NOT DETECTED Final   Influenza A H1 NOT DETECTED NOT DETECTED Final   Influenza A H1 2009 NOT DETECTED NOT DETECTED Final   Influenza A H3 NOT DETECTED NOT DETECTED Final   Influenza B NOT DETECTED NOT DETECTED Final   Parainfluenza Virus 1 NOT DETECTED NOT DETECTED Final   Parainfluenza Virus 2 NOT DETECTED NOT DETECTED Final   Parainfluenza Virus 3 NOT DETECTED NOT DETECTED Final   Parainfluenza Virus 4 NOT DETECTED NOT DETECTED Final   Respiratory Syncytial Virus NOT DETECTED NOT DETECTED Final   Bordetella pertussis NOT DETECTED NOT DETECTED Final   Chlamydophila pneumoniae NOT DETECTED NOT DETECTED Final   Mycoplasma pneumoniae NOT DETECTED NOT DETECTED Final    Comment: Performed at [facility], [place] [address], [place], [place] [address]  Culture, expectorated sputum-assessment     Status: None   Collection Time: 11/29/17  9:35 PM  Result Value Ref Range Status   Specimen Description SPUTUM  Final   Special Requests NONE  Final   Sputum evaluation   Final    THIS SPECIMEN IS ACCEPTABLE FOR SPUTUM CULTURE Performed at [facility], [address], [place], [place] [address]    Report Status 11/30/2017 FINAL  Final  Culture, respiratory (NON-Expectorated)     Status: None   Collection Time: 11/29/17  9:35 PM  Result Value Ref Range  Status   Specimen Description   Final    SPUTUM Performed at [facility], [address], [place], [place] [address]    Special Requests   Final    NONE Reflexed from [phone] Performed at [facility], [address] [place], [place] [address]    Gram Stain   Final    RARE WBC PRESENT, PREDOMINANTLY PMN FEW YEAST RARE GRAM POSITIVE COCCI IN PAIRS Performed at [facility], [place] [address] [place], [place] [address]    Culture FEW CANDIDA ALBICANS  Final   Report Status 12/03/2017 FINAL  Final  Culture,  bal-quantitative     Status: None   Collection Time: 11/30/17  2:50 PM  Result Value Ref Range Status   Specimen Description   Final    Bronch Lavag Performed at [facility], [address], [place], [place] [address]    Special Requests   Final    Immunocompromised Performed at [facility], [address] [place], [place] [address]    Gram Stain   Final    RARE WBC PRESENT, PREDOMINANTLY PMN NO ORGANISMS SEEN    Culture   Final    NO GROWTH Performed at [facility], [place] [address], [place], [place] [address]    Report Status 12/05/2017 FINAL  Final  Acid Fast Smear (AFB)     Status: None   Collection Time: 11/30/17  2:50 PM  Result Value Ref Range Status   AFB Specimen Processing Concentration  Final   Acid Fast Smear Negative  Final    Comment: (NOTE) Performed At: [facility] [address] [place], [place] [id number] Rush Farmer MD [id number]    Source (AFB) BRONCHIAL ALVEOLAR LAVAGE  Final    Comment: Performed at [facility], [address] [place], [place] [address]  Culture, fungus without smear     Status: Abnormal (Preliminary result)   Collection Time: 11/30/17  2:50 PM  Result Value Ref Range Status   Specimen Description   Final    BRONCHIAL ALVEOLAR LAVAGE Performed at [facility], [address] [place], [place] [address]    Special Requests   Final     Immunocompromised Performed at [facility], [address] [place], [place] [address]    Culture CANDIDA ALBICANS (A)  Final   Report Status PENDING  Incomplete  Virus culture     Status: None   Collection Time: 11/30/17  2:50 PM  Result Value Ref Range Status   Viral Culture Comment  Final    Comment: (NOTE) Preliminary Report: No virus isolated at 4 days.  Next report to follow after 7 days. Performed At: [facility] [place], [place] [id number] Rush Farmer MD IP:[id number]    Source of Sample BRONCHIAL ALVEOLAR LAVAGE  Final    Comment: Performed at [facility], [address] [place], [place] [address]      Scheduled Meds: . cholecalciferol  2,000 Units Oral Daily  . ferrous sulfate  325 mg Oral BID WC  . letrozole  2.5 mg Oral Daily  . methylPREDNISolone (SOLU-MEDROL) injection  60 mg Intravenous Q24H  . metoprolol tartrate  12.5 mg Oral BID  . multivitamin with minerals  1 tablet Oral Daily  . pantoprazole  40 mg Oral Daily  . polyethylene glycol  17 g Oral Daily  . protein supplement shake  11 oz Oral BID BM  . sodium chloride flush  3 mL Intravenous Q12H   Continuous Infusions: . sodium chloride      Assessment/Plan:  * AKI Renal ultrasound normal. 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 Kidney biopsy 12/03/2017. Biopsy results being. Discussed with Dr. Candiss Norse   Further treatment plan as per biopsy results.  Hopefully results will be available tomorrow.  * HCAP. Fever has resolved Associated with Acute hypoxic resp failure  Discussed with Dr. Ola Spurr.    Patient finished course of IV Zosyn and azithromycin. Bronchoscopy 11/30/2017 Cx negative  * [facility] -Dr. Jefm Bryant has seen the patient in the hospital  * Acute systolic congestive heart failure with EF 45-50%.   Resolved  *  Acute kidney injury on chronic kidney disease stage III.   * Metastatic breast cancer. LN biopsy negative for breast cancer.  .   Patient also has a pericardial effusion and lymphadenopathy seen on the chest. Malignant? Could also be due to Lupus.  * Essential hypertension.  Blood pressure on the lower side.  Held meds  * Weakness.  Physical therapy recommended home health.  * Pancytopenia with cancer history - Appreciate oncology input.   Code Status:     Code Status Orders  (From admission, onward)        Start     Ordered   11/18/17 2216  Full code  Continuous     11/18/17 2217    Code Status History    Date Active Date Inactive Code Status Order ID Comments User Context   09/22/2017 03:26 09/22/2017 19:40 Full Code [id number]  Lance Coon, MD Inpatient      Time spent: 25 minutes  Katrine Radich [facility]

## 2017-12-07 ENCOUNTER — Other Ambulatory Visit: Payer: Self-pay

## 2017-12-07 ENCOUNTER — Inpatient Hospital Stay: Payer: 59

## 2017-12-07 ENCOUNTER — Telehealth: Payer: Self-pay

## 2017-12-07 LAB — HEMOGLOBIN A1C
Hgb A1c MFr Bld: 5.9 % — ABNORMAL HIGH (ref 4.8–5.6)
Mean Plasma Glucose: 122.63 mg/dL

## 2017-12-07 LAB — RENAL FUNCTION PANEL
ALBUMIN: 2.1 g/dL — AB (ref 3.5–5.0)
ANION GAP: 10 (ref 5–15)
BUN: 92 mg/dL — ABNORMAL HIGH (ref 6–20)
CO2: 16 mmol/L — ABNORMAL LOW (ref 22–32)
Calcium: 8.4 mg/dL — ABNORMAL LOW (ref 8.9–10.3)
Chloride: 106 mmol/L (ref 101–111)
Creatinine, Ser: 1.52 mg/dL — ABNORMAL HIGH (ref 0.44–1.00)
GFR calc Af Amer: 44 mL/min — ABNORMAL LOW (ref >60)
GFR, EST NON AFRICAN AMERICAN: 38 mL/min — AB (ref >60)
GLUCOSE: 342 mg/dL — AB (ref 65–99)
PHOSPHORUS: 3.5 mg/dL (ref 2.5–4.6)
Potassium: 3.9 mmol/L (ref 3.5–5.1)
Sodium: 132 mmol/L — ABNORMAL LOW (ref 135–145)

## 2017-12-07 LAB — URINALYSIS, ROUTINE W REFLEX MICROSCOPIC
BACTERIA UA: NONE SEEN
BILIRUBIN URINE: NEGATIVE
Glucose, UA: 50 mg/dL — AB
KETONES UR: NEGATIVE mg/dL
Leukocytes, UA: NEGATIVE
Nitrite: NEGATIVE
PROTEIN: 30 mg/dL — AB
Specific Gravity, Urine: 1.017 (ref 1.005–1.030)
pH: 5 (ref 5.0–8.0)

## 2017-12-07 LAB — GLUCOSE, CAPILLARY
GLUCOSE-CAPILLARY: 227 mg/dL — AB (ref 65–99)
GLUCOSE-CAPILLARY: 146 mg/dL — AB (ref 65–99)
GLUCOSE-CAPILLARY: 249 mg/dL — AB (ref 65–99)
Glucose-Capillary: 210 mg/dL — ABNORMAL HIGH (ref 65–99)

## 2017-12-07 NOTE — Progress Notes (Signed)
[facility]  ROUNDING NOTE   Subjective:  Patient seen at bedside. Creatinine continues to trend down and is currently 1.52 BUN remains high secondary to steroid use. Denies SOB, Leg edema  Objective:  Vital signs in last 24 hours:  Temp:  [97.5 F (36.4 C)-99.6 F (37.6 C)] 97.5 F (36.4 C) (02/19 1128) Pulse Rate:  [85-109] 85 (02/19 1128) Resp:  [18-24] 24 (02/19 1128) BP: (124-135)/(78-85) 124/78 (02/19 1128) SpO2:  [99 %-100 %] 100 % (02/19 1128) Weight:  [67.9 kg (149 lb 12.8 oz)] 67.9 kg (149 lb 12.8 oz) (02/19 0432)  Weight change:  Filed Weights   12/04/17 0500 12/05/17 0513 12/07/17 0432  Weight: 66.1 kg (145 lb 12.8 oz) 66.4 kg (146 lb 6.2 oz) 67.9 kg (149 lb 12.8 oz)    Intake/Output: I/O last 3 completed shifts: In: -  Out: 300 [Stool:300]   Intake/Output this shift:  Total I/O In: 240 [P.O.:240] Out: -   Physical Exam: General: No acute distress  Head: Normocephalic, atraumatic. Moist oral mucosal membranes  Eyes: Anicteric  Neck: Supple, trachea midline  Lungs:  Scattered rhonchi, normal effort  Heart: S1S2 no rubs  Abdomen:  Soft, nontender, bowel sounds present  Extremities: trace peripheral edema.  Neurologic: Awake, alert, following commands  Skin: No lesions       Basic Metabolic Panel: Recent Labs  Lab 12/03/17 0559 12/04/17 0324 12/05/17 0353 12/06/17 0408 12/07/17 0535  NA 129* 131* 132* 132* 132*  K 3.4* 3.2* 3.7 3.7 3.9  CL 100* 102 106 105 106  CO2 17* 18* 18* 17* 16*  GLUCOSE 182* 205* 205* 187* 342*  BUN 83* 98* 98* 97* 92*  CREATININE 2.95* 2.48* 2.01* 1.67* 1.52*  CALCIUM 8.6* 8.6* 8.6* 8.7* 8.4*  PHOS  --   --   --   --  3.5    Liver Function Tests: Recent Labs  Lab 12/02/17 1428 12/07/17 0535  AST 54*  --   ALT 30  --   ALKPHOS 37*  --   BILITOT 0.9  --   PROT 7.1  --   ALBUMIN 2.2* 2.1*   No results for input(s): LIPASE, AMYLASE in the last 168 hours. No results for input(s): AMMONIA in  the last 168 hours.  CBC: Recent Labs  Lab 12/01/17 0301 12/02/17 0602 12/03/17 0559 12/04/17 0324 12/06/17 0408  WBC 3.4* 3.0* 2.9* 3.6 11.8*  NEUTROABS 2.9 2.5  --  3.1 10.3*  HGB 9.1* 9.0* 8.6* 8.9* 9.6*  HCT 26.3* 25.9* 24.6* 25.5* 27.2*  MCV 86.5 87.0 84.7 85.7 85.4  PLT 117* 99* 90* 113* 99*    Cardiac Enzymes: Recent Labs  Lab 12/02/17 1428  CKTOTAL 24*    BNP: Invalid input(s): POCBNP  CBG: Recent Labs  Lab 12/06/17 1648 12/06/17 2001 12/07/17 0756 12/07/17 1150  GLUCAP 141* 141* 21* 210*    Microbiology: Results for orders placed or performed during the hospital encounter of 11/18/17  MRSA PCR Screening     Status: None   Collection Time: 11/18/17  9:50 PM  Result Value Ref Range Status   MRSA by PCR NEGATIVE NEGATIVE Final    Comment:        The GeneXpert MRSA Assay (FDA approved for NASAL specimens only), is one component of a comprehensive MRSA colonization surveillance program. It is not intended to diagnose MRSA infection nor to guide or monitor treatment for MRSA infections. Performed at [facility], [address] [place], [place] [address]  MRSA PCR Screening     Status: None   Collection Time: 11/23/17  5:41 PM  Result Value Ref Range Status   MRSA by PCR NEGATIVE NEGATIVE Final    Comment:        The GeneXpert MRSA Assay (FDA approved for NASAL specimens only), is one component of a comprehensive MRSA colonization surveillance program. It is not intended to diagnose MRSA infection nor to guide or monitor treatment for MRSA infections. Performed at [facility], [address] [place], [place] [address]   Gastrointestinal Panel by PCR , Stool     Status: None   Collection Time: 11/26/17  8:12 AM  Result Value Ref Range Status   Campylobacter species NOT DETECTED NOT DETECTED Final   Plesimonas shigelloides NOT DETECTED NOT DETECTED Final   Salmonella species NOT DETECTED NOT DETECTED Final    Yersinia enterocolitica NOT DETECTED NOT DETECTED Final   Vibrio species NOT DETECTED NOT DETECTED Final   Vibrio cholerae NOT DETECTED NOT DETECTED Final   Enteroaggregative E coli (EAEC) NOT DETECTED NOT DETECTED Final   Enteropathogenic E coli (EPEC) NOT DETECTED NOT DETECTED Final   Enterotoxigenic E coli (ETEC) NOT DETECTED NOT DETECTED Final   Shiga like toxin producing E coli (STEC) NOT DETECTED NOT DETECTED Final   Shigella/Enteroinvasive E coli (EIEC) NOT DETECTED NOT DETECTED Final   Cryptosporidium NOT DETECTED NOT DETECTED Final   Cyclospora cayetanensis NOT DETECTED NOT DETECTED Final   Entamoeba histolytica NOT DETECTED NOT DETECTED Final   Giardia lamblia NOT DETECTED NOT DETECTED Final   Adenovirus F40/41 NOT DETECTED NOT DETECTED Final   Astrovirus NOT DETECTED NOT DETECTED Final   Norovirus GI/GII NOT DETECTED NOT DETECTED Final   Rotavirus A NOT DETECTED NOT DETECTED Final   Sapovirus (I, II, IV, and V) NOT DETECTED NOT DETECTED Final    Comment: Performed at [facility], [address] [place], [place] [address]  C difficile quick scan w PCR reflex     Status: None   Collection Time: 11/26/17  8:12 AM  Result Value Ref Range Status   C Diff antigen NEGATIVE NEGATIVE Final   C Diff toxin NEGATIVE NEGATIVE Final   C Diff interpretation No C. difficile detected.  Final    Comment: Performed at [facility], [address] [place], [place] [address]  CULTURE, BLOOD (ROUTINE X 2) w Reflex to ID Panel     Status: None   Collection Time: 11/27/17  8:58 AM  Result Value Ref Range Status   Specimen Description BLOOD RIGHT ANTECUBITAL  Final   Special Requests   Final    BOTTLES DRAWN AEROBIC AND ANAEROBIC Blood Culture adequate volume   Culture   Final    NO GROWTH 5 DAYS Performed at [facility], [address] [place], [place] [address]    Report Status 12/02/2017 FINAL  Final  CULTURE, BLOOD (ROUTINE X 2) w Reflex to ID Panel      Status: None   Collection Time: 11/27/17 11:03 AM  Result Value Ref Range Status   Specimen Description BLOOD RIGHT ANTECUBITAL  Final   Special Requests   Final    BOTTLES DRAWN AEROBIC AND ANAEROBIC Blood Culture adequate volume   Culture   Final    NO GROWTH 5 DAYS Performed at [facility], [address], [place], [place] [address]    Report Status 12/02/2017 FINAL  Final  Respiratory Panel by PCR     Status: None  Collection Time: 11/29/17  5:30 PM  Result Value Ref Range Status   Adenovirus NOT DETECTED NOT DETECTED Final   Coronavirus 229E NOT DETECTED NOT DETECTED Final   Coronavirus HKU1 NOT DETECTED NOT DETECTED Final   Coronavirus NL63 NOT DETECTED NOT DETECTED Final   Coronavirus OC43 NOT DETECTED NOT DETECTED Final   Metapneumovirus NOT DETECTED NOT DETECTED Final   Rhinovirus / Enterovirus NOT DETECTED NOT DETECTED Final   Influenza A NOT DETECTED NOT DETECTED Final   Influenza A H1 NOT DETECTED NOT DETECTED Final   Influenza A H1 2009 NOT DETECTED NOT DETECTED Final   Influenza A H3 NOT DETECTED NOT DETECTED Final   Influenza B NOT DETECTED NOT DETECTED Final   Parainfluenza Virus 1 NOT DETECTED NOT DETECTED Final   Parainfluenza Virus 2 NOT DETECTED NOT DETECTED Final   Parainfluenza Virus 3 NOT DETECTED NOT DETECTED Final   Parainfluenza Virus 4 NOT DETECTED NOT DETECTED Final   Respiratory Syncytial Virus NOT DETECTED NOT DETECTED Final   Bordetella pertussis NOT DETECTED NOT DETECTED Final   Chlamydophila pneumoniae NOT DETECTED NOT DETECTED Final   Mycoplasma pneumoniae NOT DETECTED NOT DETECTED Final    Comment: Performed at [facility], [place] [address] [place], [place] [address]  Culture, expectorated sputum-assessment     Status: None   Collection Time: 11/29/17  9:35 PM  Result Value Ref Range Status   Specimen Description SPUTUM  Final   Special Requests NONE  Final   Sputum evaluation   Final    THIS SPECIMEN IS ACCEPTABLE FOR  SPUTUM CULTURE Performed at [facility], [address], [place], [place] [address]    Report Status 11/30/2017 FINAL  Final  Culture, respiratory (NON-Expectorated)     Status: None   Collection Time: 11/29/17  9:35 PM  Result Value Ref Range Status   Specimen Description   Final    SPUTUM Performed at [facility], [address], [place], [place] [address]    Special Requests   Final    NONE Reflexed from [phone] Performed at [facility], [address] [place], [place] [address]    Gram Stain   Final    RARE WBC PRESENT, PREDOMINANTLY PMN FEW YEAST RARE GRAM POSITIVE COCCI IN PAIRS Performed at [facility], [place] [address] [place], [place] [address]    Culture FEW CANDIDA ALBICANS  Final   Report Status 12/03/2017 FINAL  Final  Culture, bal-quantitative     Status: None   Collection Time: 11/30/17  2:50 PM  Result Value Ref Range Status   Specimen Description   Final    Bronch Lavag Performed at [facility], [address], [place], [place] [address]    Special Requests   Final    Immunocompromised Performed at [facility], [address] [place], [place] [address]    Gram Stain   Final    RARE WBC PRESENT, PREDOMINANTLY PMN NO ORGANISMS SEEN    Culture   Final    NO GROWTH Performed at [facility], [place] [address] [place], [place] [address]    Report Status 12/05/2017 FINAL  Final  Acid Fast Smear (AFB)     Status: None   Collection Time: 11/30/17  2:50 PM  Result Value Ref Range Status   AFB Specimen Processing Concentration  Final   Acid Fast Smear Negative  Final    Comment: (NOTE) Performed At: [facility] [address]  [place], [place] [id number] Rush Farmer MD [id number]    Source (AFB) BRONCHIAL ALVEOLAR LAVAGE  Final    Comment: Performed at [facility], [address] [place], [place] [address]  Culture, fungus without smear     Status: Abnormal  (Preliminary result)   Collection Time: 11/30/17  2:50 PM  Result Value Ref Range Status   Specimen Description   Final    BRONCHIAL ALVEOLAR LAVAGE Performed at [facility], [address] [place], [place] [address]    Special Requests   Final    Immunocompromised Performed at [facility], [address] [place], [place] [address]    Culture CANDIDA ALBICANS (A)  Final   Report Status PENDING  Incomplete  Virus culture     Status: None   Collection Time: 11/30/17  2:50 PM  Result Value Ref Range Status   Viral Culture Comment  Final    Comment: (NOTE) Preliminary Report: No virus isolated at 4 days.  Next report to follow after 7 days. Performed At: [facility] [place], [place] [id number] Rush Farmer MD [id number]    Source of Sample BRONCHIAL ALVEOLAR LAVAGE  Final    Comment: Performed at [facility], [address] [place], [place] [address]    Coagulation Studies: No results for input(s): LABPROT, INR in the last 72 hours.  Urinalysis: No results for input(s): COLORURINE, LABSPEC, PHURINE, GLUCOSEU, HGBUR, BILIRUBINUR, KETONESUR, PROTEINUR, UROBILINOGEN, NITRITE, LEUKOCYTESUR in the last 72 hours.  Invalid input(s): APPERANCEUR    Imaging: No results found.   Medications:   . sodium chloride     . cholecalciferol  2,000 Units Oral Daily  . ferrous sulfate  325 mg Oral BID WC  . insulin aspart  0-5 Units Subcutaneous QHS  . insulin aspart  0-9 Units Subcutaneous TID WC  . letrozole  2.5 mg Oral Daily  . methylPREDNISolone (SOLU-MEDROL) injection  60 mg Intravenous Q24H  . metoprolol tartrate  12.5 mg Oral BID  . multivitamin with minerals  1 tablet Oral Daily  . nystatin  5 mL Oral QID  . pantoprazole  40 mg Oral Daily  . polyethylene glycol  17 g Oral Daily  . protein supplement shake  11 oz Oral BID BM  . sodium chloride flush  3 mL Intravenous Q12H   sodium chloride, acetaminophen,  ALPRAZolam, alum & mag hydroxide-simeth, benzonatate, guaiFENesin, ipratropium-albuterol, loperamide, ondansetron (ZOFRAN) IV, phenylephrine, sodium chloride flush, triamcinolone cream  Assessment/ Plan:  52 y.o. female with hypertension, anemia, vitamin D deficiency, breast cancer status post left mastectomy, chemotherapy and radiation, beta thalasemia, pancytopenia.    1.  Acute renal failure with chronic kidney disease stage III with history of proteinuria, hematuria, and history of positive ANA.  Renal biopsy performed 12/03/17.  -BUN remains high at 92 however creatinine has trended down to 1.52.  EGFR currently 44 Patient.underwent renal biopsy on Friday.  Hopefully we will have a preliminary result soon.  Thereafter additional treatment can be considered.  2.  Hypertension.  Continue metoprolol 12.5 mg p.o. twice daily. Avoiding ACE-I at present due to fluctuating creatinine     LOS: [place] 2/19/20193:34 PM

## 2017-12-07 NOTE — Telephone Encounter (Signed)
LM at patient's  Home/mobile number to please call back - needs a hospital follow up appointment.

## 2017-12-08 LAB — PROTEIN / CREATININE RATIO, URINE
Creatinine, Urine: 87 mg/dL
Protein Creatinine Ratio: 0.94 mg/mg{Cre} — ABNORMAL HIGH (ref 0.00–0.15)
Total Protein, Urine: 82 mg/dL

## 2017-12-08 LAB — PREGNANCY, URINE: PREG TEST UR: NEGATIVE

## 2017-12-08 LAB — GLUCOSE, CAPILLARY
GLUCOSE-CAPILLARY: 223 mg/dL — AB (ref 65–99)
Glucose-Capillary: 184 mg/dL — ABNORMAL HIGH (ref 65–99)

## 2017-12-08 LAB — C3 COMPLEMENT: C3 COMPLEMENT: 22 mg/dL — AB (ref 82–167)

## 2017-12-08 LAB — C4 COMPLEMENT: COMPLEMENT C4, BODY FLUID: 3 mg/dL — AB (ref 14–44)

## 2017-12-08 MED ORDER — MYCOPHENOLATE MOFETIL 250 MG PO CAPS: 500.0000 mg | ORAL_CAPSULE | Freq: Two times a day (BID) | ORAL | 0 refills | Status: AC

## 2017-12-08 MED ORDER — NYSTATIN 100000 UNIT/ML MT SUSP: 5.0000 mL | Freq: Four times a day (QID) | OROMUCOSAL | 0 refills | Status: DC

## 2017-12-08 MED ORDER — PREDNISONE 20 MG PO TABS: 60.0000 mg | ORAL_TABLET | Freq: Every day | ORAL | 0 refills | Status: DC

## 2017-12-08 MED ORDER — MYCOPHENOLATE MOFETIL 250 MG PO CAPS
500.0000 mg | ORAL_CAPSULE | Freq: Two times a day (BID) | ORAL | Status: DC
  Filled 2017-12-08 (×2): qty 2

## 2017-12-08 MED ORDER — METOPROLOL TARTRATE 25 MG PO TABS: 12.5000 mg | ORAL_TABLET | Freq: Two times a day (BID) | ORAL | 0 refills | Status: DC

## 2017-12-08 MED ORDER — HYDROXYCHLOROQUINE SULFATE 200 MG PO TABS: 200.0000 mg | ORAL_TABLET | Freq: Every day | ORAL | 2 refills | Status: AC

## 2017-12-08 NOTE — Discharge Instructions (Addendum)
Follow-up with primary care physician in 5-7 days Follow-up with nephrology in 1 week Follow-up with primary oncology in 1 week or sooner as needed Follow-up with cardiology in 3 days

## 2017-12-08 NOTE — Consult Note (Signed)
Follow-up lupus No new complaints.  Off oxygen.  Ambulating in room.  No change in skin rash.  No shortness of breath Creatinine improved still has elevated BUN.  Pending renal biopsy report.  Complement still low.  Positive FANA, anti-DNA negative if positive anti-Smith antibody  Exam: Afebrile.  Clear chest.  Hyperpigmented lesions in both forearms.  No synovitis.  Trace edema  Impression: systemic lupus.  Skin rash, prior synovitis, i neutropenia and cytopenia.  Warm antibodies.  Positive ANA.  Low complements.  Positive Smith and RNP antibodies.  Low titer anticoagulant  Acute renal insufficiency with proteinuria.  Renal biopsy ruling out lupus is pending.  Metastatic breast cancer  Plan: If renal biopsy is negative for lupus, then she could be discharged on a slow apering dose of prednisone and Plaquenil 200 mg daily.  Happy to see as outpatient  If renal biopsy is positive, then will discuss with nephrology and hematology regarding CellCept, Cytoxan etc.

## 2017-12-08 NOTE — Progress Notes (Signed)
ID E note Renal bxp +. Cxr much better. Off O2  Will be dced today on cytoxan and pred, plaquenil No need for abx treatment but discussed with Dr Candiss Norse Bactrim SS qd for PCP ppx.

## 2017-12-08 NOTE — Progress Notes (Signed)
[facility]  ROUNDING NOTE   Subjective:  Patient seen at bedside. Creatinine continues to trend down and is currently 1.52 (2/19) BUN remains high secondary to steroid use. Denies SOB, Leg edema Sitting up iin chair in anticipation of d/c  Objective:  Vital signs in last 24 hours:  Temp:  [97.5 F (36.4 C)] 97.5 F (36.4 C) (02/20 0336) Pulse Rate:  [93-94] 94 (02/20 0336) Resp:  [15] 15 (02/20 0336) BP: (132-140)/(80-93) 140/93 (02/20 0336) SpO2:  [100 %] 100 % (02/20 0336) Weight:  [68.6 kg (151 lb 3.2 oz)] 68.6 kg (151 lb 3.2 oz) (02/20 0336)  Weight change: 0.635 kg (1 lb 6.4 oz) Filed Weights   12/05/17 0513 12/07/17 0432 12/08/17 0336  Weight: 66.4 kg (146 lb 6.2 oz) 67.9 kg (149 lb 12.8 oz) 68.6 kg (151 lb 3.2 oz)    Intake/Output: I/O last 3 completed shifts: In: 240 [P.O.:240] Out: 300 [Stool:300]   Intake/Output this shift:  No intake/output data recorded.  Physical Exam: General: No acute distress  Head: Normocephalic, atraumatic. Moist oral mucosal membranes  Eyes: Anicteric  Neck: Supple, trachea midline  Lungs:  Clear b/l normal effort  Heart: S1S2 no rubs  Abdomen:  Soft, nontender, bowel sounds present  Extremities: no peripheral edema.  Neurologic: Awake, alert, oreinted  Skin: No lesions       Basic Metabolic Panel: Recent Labs  Lab 12/03/17 0559 12/04/17 0324 12/05/17 0353 12/06/17 0408 12/07/17 0535  NA 129* 131* 132* 132* 132*  K 3.4* 3.2* 3.7 3.7 3.9  CL 100* 102 106 105 106  CO2 17* 18* 18* 17* 16*  GLUCOSE 182* 205* 205* 187* 342*  BUN 83* 98* 98* 97* 92*  CREATININE 2.95* 2.48* 2.01* 1.67* 1.52*  CALCIUM 8.6* 8.6* 8.6* 8.7* 8.4*  PHOS  --   --   --   --  3.5    Liver Function Tests: Recent Labs  Lab 12/02/17 1428 12/07/17 0535  AST 54*  --   ALT 30  --   ALKPHOS 37*  --   BILITOT 0.9  --   PROT 7.1  --   ALBUMIN 2.2* 2.1*   No results for input(s): LIPASE, AMYLASE in the last 168 hours. No results  for input(s): AMMONIA in the last 168 hours.  CBC: Recent Labs  Lab 12/02/17 0602 12/03/17 0559 12/04/17 0324 12/06/17 0408  WBC 3.0* 2.9* 3.6 11.8*  NEUTROABS 2.5  --  3.1 10.3*  HGB 9.0* 8.6* 8.9* 9.6*  HCT 25.9* 24.6* 25.5* 27.2*  MCV 87.0 84.7 85.7 85.4  PLT 99* 90* 113* 99*    Cardiac Enzymes: Recent Labs  Lab 12/02/17 1428  CKTOTAL 24*    BNP: Invalid input(s): POCBNP  CBG: Recent Labs  Lab 12/07/17 1150 12/07/17 1719 12/07/17 2112 12/08/17 0757 12/08/17 1153  GLUCAP 210* 146* 249* 184* 10*    Microbiology: Results for orders placed or performed during the hospital encounter of 11/18/17  MRSA PCR Screening     Status: None   Collection Time: 11/18/17  9:50 PM  Result Value Ref Range Status   MRSA by PCR NEGATIVE NEGATIVE Final    Comment:        The GeneXpert MRSA Assay (FDA approved for NASAL specimens only), is one component of a comprehensive MRSA colonization surveillance program. It is not intended to diagnose MRSA infection nor to guide or monitor treatment for MRSA infections. Performed at [facility], [address], [place], [place] [address]  MRSA PCR Screening     Status: None   Collection Time: 11/23/17  5:41 PM  Result Value Ref Range Status   MRSA by PCR NEGATIVE NEGATIVE Final    Comment:        The GeneXpert MRSA Assay (FDA approved for NASAL specimens only), is one component of a comprehensive MRSA colonization surveillance program. It is not intended to diagnose MRSA infection nor to guide or monitor treatment for MRSA infections. Performed at [facility], [address] [place], [place] [address]   Gastrointestinal Panel by PCR , Stool     Status: None   Collection Time: 11/26/17  8:12 AM  Result Value Ref Range Status   Campylobacter species NOT DETECTED NOT DETECTED Final   Plesimonas shigelloides NOT DETECTED NOT DETECTED Final   Salmonella species NOT DETECTED NOT DETECTED Final    Yersinia enterocolitica NOT DETECTED NOT DETECTED Final   Vibrio species NOT DETECTED NOT DETECTED Final   Vibrio cholerae NOT DETECTED NOT DETECTED Final   Enteroaggregative E coli (EAEC) NOT DETECTED NOT DETECTED Final   Enteropathogenic E coli (EPEC) NOT DETECTED NOT DETECTED Final   Enterotoxigenic E coli (ETEC) NOT DETECTED NOT DETECTED Final   Shiga like toxin producing E coli (STEC) NOT DETECTED NOT DETECTED Final   Shigella/Enteroinvasive E coli (EIEC) NOT DETECTED NOT DETECTED Final   Cryptosporidium NOT DETECTED NOT DETECTED Final   Cyclospora cayetanensis NOT DETECTED NOT DETECTED Final   Entamoeba histolytica NOT DETECTED NOT DETECTED Final   Giardia lamblia NOT DETECTED NOT DETECTED Final   Adenovirus F40/41 NOT DETECTED NOT DETECTED Final   Astrovirus NOT DETECTED NOT DETECTED Final   Norovirus GI/GII NOT DETECTED NOT DETECTED Final   Rotavirus A NOT DETECTED NOT DETECTED Final   Sapovirus (I, II, IV, and V) NOT DETECTED NOT DETECTED Final    Comment: Performed at [facility], [address] [place], [place] [address]  C difficile quick scan w PCR reflex     Status: None   Collection Time: 11/26/17  8:12 AM  Result Value Ref Range Status   C Diff antigen NEGATIVE NEGATIVE Final   C Diff toxin NEGATIVE NEGATIVE Final   C Diff interpretation No C. difficile detected.  Final    Comment: Performed at [facility], [address] [place], [place] [address]  CULTURE, BLOOD (ROUTINE X 2) w Reflex to ID Panel     Status: None   Collection Time: 11/27/17  8:58 AM  Result Value Ref Range Status   Specimen Description BLOOD RIGHT ANTECUBITAL  Final   Special Requests   Final    BOTTLES DRAWN AEROBIC AND ANAEROBIC Blood Culture adequate volume   Culture   Final    NO GROWTH 5 DAYS Performed at [facility], [address] [place], [place] [address]    Report Status 12/02/2017 FINAL  Final  CULTURE, BLOOD (ROUTINE X 2) w Reflex to ID Panel      Status: None   Collection Time: 11/27/17 11:03 AM  Result Value Ref Range Status   Specimen Description BLOOD RIGHT ANTECUBITAL  Final   Special Requests   Final    BOTTLES DRAWN AEROBIC AND ANAEROBIC Blood Culture adequate volume   Culture   Final    NO GROWTH 5 DAYS Performed at [facility], [address] [place], [place] [address]    Report Status 12/02/2017 FINAL  Final  Respiratory Panel by PCR     Status: None  Collection Time: 11/29/17  5:30 PM  Result Value Ref Range Status   Adenovirus NOT DETECTED NOT DETECTED Final   Coronavirus 229E NOT DETECTED NOT DETECTED Final   Coronavirus HKU1 NOT DETECTED NOT DETECTED Final   Coronavirus NL63 NOT DETECTED NOT DETECTED Final   Coronavirus OC43 NOT DETECTED NOT DETECTED Final   Metapneumovirus NOT DETECTED NOT DETECTED Final   Rhinovirus / Enterovirus NOT DETECTED NOT DETECTED Final   Influenza A NOT DETECTED NOT DETECTED Final   Influenza A H1 NOT DETECTED NOT DETECTED Final   Influenza A H1 2009 NOT DETECTED NOT DETECTED Final   Influenza A H3 NOT DETECTED NOT DETECTED Final   Influenza B NOT DETECTED NOT DETECTED Final   Parainfluenza Virus 1 NOT DETECTED NOT DETECTED Final   Parainfluenza Virus 2 NOT DETECTED NOT DETECTED Final   Parainfluenza Virus 3 NOT DETECTED NOT DETECTED Final   Parainfluenza Virus 4 NOT DETECTED NOT DETECTED Final   Respiratory Syncytial Virus NOT DETECTED NOT DETECTED Final   Bordetella pertussis NOT DETECTED NOT DETECTED Final   Chlamydophila pneumoniae NOT DETECTED NOT DETECTED Final   Mycoplasma pneumoniae NOT DETECTED NOT DETECTED Final    Comment: Performed at [facility], [place] [address] [place], [place] [address]  Culture, expectorated sputum-assessment     Status: None   Collection Time: 11/29/17  9:35 PM  Result Value Ref Range Status   Specimen Description SPUTUM  Final   Special Requests NONE  Final   Sputum evaluation   Final    THIS SPECIMEN IS ACCEPTABLE FOR  SPUTUM CULTURE Performed at [facility], [address], [place], [place] [address]    Report Status 11/30/2017 FINAL  Final  Culture, respiratory (NON-Expectorated)     Status: None   Collection Time: 11/29/17  9:35 PM  Result Value Ref Range Status   Specimen Description   Final    SPUTUM Performed at [facility], [address] [place], [place] [address]    Special Requests   Final    NONE Reflexed from [id number] Performed at [facility], [address] [place], [place] [address]    Gram Stain   Final    RARE WBC PRESENT, PREDOMINANTLY PMN FEW YEAST RARE GRAM POSITIVE COCCI IN PAIRS Performed at [facility], [place] [address] [place], [place] [address]    Culture FEW CANDIDA ALBICANS  Final   Report Status 12/03/2017 FINAL  Final  Culture, bal-quantitative     Status: None   Collection Time: 11/30/17  2:50 PM  Result Value Ref Range Status   Specimen Description   Final    Bronch Lavag Performed at [facility], [address] [place], [place] [address]    Special Requests   Final    Immunocompromised Performed at [facility], [address] [place], [place] [address]    Gram Stain   Final    RARE WBC PRESENT, PREDOMINANTLY PMN NO ORGANISMS SEEN    Culture   Final    NO GROWTH Performed at [facility], [place] [address] [place], [place] [address]    Report Status 12/05/2017 FINAL  Final  Acid Fast Smear (AFB)     Status: None   Collection Time: 11/30/17  2:50 PM  Result Value Ref Range Status   AFB Specimen Processing Concentration  Final   Acid Fast Smear Negative  Final    Comment: (NOTE) Performed At: [facility] [address]  [place], [place] [id number] Rush Farmer MD WN:[id number]    Source (AFB) BRONCHIAL ALVEOLAR LAVAGE  Final    Comment: Performed at [facility], [address] [place], [place] [address]  Culture, fungus without smear     Status: Abnormal  (Preliminary result)   Collection Time: 11/30/17  2:50 PM  Result Value Ref Range Status   Specimen Description   Final    BRONCHIAL ALVEOLAR LAVAGE Performed at [facility], [address], [place], [place] [address]    Special Requests   Final    Immunocompromised Performed at [facility], [address] [place], [place] [address]    Culture CANDIDA ALBICANS (A)  Final   Report Status PENDING  Incomplete  Virus culture     Status: None   Collection Time: 11/30/17  2:50 PM  Result Value Ref Range Status   Viral Culture Comment  Final    Comment: (NOTE) Preliminary Report: No virus isolated at 4 days.  Next report to follow after 7 days. Performed At: [facility] [place], [place] [id number] Rush Farmer MD CZ:[id number]    Source of Sample BRONCHIAL ALVEOLAR LAVAGE  Final    Comment: Performed at [facility], [address] [place], [place] [address]    Coagulation Studies: No results for input(s): LABPROT, INR in the last 72 hours.  Urinalysis: Recent Labs    12/07/17 2240  COLORURINE YELLOW*  LABSPEC 1.017  PHURINE 5.0  GLUCOSEU 50*  HGBUR SMALL*  BILIRUBINUR NEGATIVE  KETONESUR NEGATIVE  PROTEINUR 30*  NITRITE NEGATIVE  LEUKOCYTESUR NEGATIVE      Imaging: Dg Chest 2 View  Result Date: 12/07/2017 CLINICAL DATA:  52 year old female with fever, shortness of breath, anemia, breast cancer. EXAM: CHEST  2 VIEW COMPARISON:  Portable chest 11/30/2017, chest CT 11/29/2017, and earlier. FINDINGS: Seated AP and lateral views of the chest. Substantially regressed bilateral pulmonary ground-glass and interstitial opacity and improved bilateral ventilation. There is residual interstitial opacity in the bilateral lower lobes, perhaps greater on the left. No definite pleural effusion. No pneumothorax. Stable cardiomegaly and mediastinal contours. Stable visualized osseous structures. Stable postoperative changes to the  left chest wall and axilla. Negative visible bowel gas pattern. IMPRESSION: 1. Substantially regressed bilateral pulmonary ground-glass/interstitial opacity since 11/29/2017. Residual bilateral lower lobe involvement. 2. Stable cardiomegaly. 3. No new cardiopulmonary abnormality. Electronically Signed   By: Genevie Ann M.D.   On: 12/07/2017 15:45     Medications:   . sodium chloride     . cholecalciferol  2,000 Units Oral Daily  . ferrous sulfate  325 mg Oral BID WC  . insulin aspart  0-5 Units Subcutaneous QHS  . insulin aspart  0-9 Units Subcutaneous TID WC  . letrozole  2.5 mg Oral Daily  . methylPREDNISolone (SOLU-MEDROL) injection  60 mg Intravenous Q24H  . metoprolol tartrate  12.5 mg Oral BID  . multivitamin with minerals  1 tablet Oral Daily  . mycophenolate  500 mg Oral BID  . nystatin  5 mL Oral QID  . pantoprazole  40 mg Oral Daily  . polyethylene glycol  17 g Oral Daily  . protein supplement shake  11 oz Oral BID BM  . sodium chloride flush  3 mL Intravenous Q12H   sodium chloride, acetaminophen, ALPRAZolam, alum & mag hydroxide-simeth, benzonatate, guaiFENesin, ipratropium-albuterol, loperamide, ondansetron (ZOFRAN) IV, phenylephrine, sodium chloride flush, triamcinolone cream  Assessment/ Plan:  52 y.o. female with hypertension, anemia, vitamin D deficiency, breast cancer status  post left mastectomy, chemotherapy and radiation, beta thalasemia, pancytopenia.    1.  Acute renal failure with chronic kidney disease stage III with history of proteinuria, hematuria, and history of positive ANA.  Renal biopsy performed 12/03/17.  -BUN remains high at 92 however creatinine has trended down to 1.52.  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  Hypertension.  Continue metoprolol 12.5 mg p.o. twice daily. Avoiding ACE-I at present due to fluctuating creatinine     LOS: 20 Tanajah Boulter 2/20/20192:15 PM

## 2017-12-08 NOTE — Progress Notes (Signed)
Physical Therapy Treatment Patient Details Name: Melanie Holloway MRN: [id number] DOB: Mar 26, 1966 Today's Date: 12/08/2017    History of Present Illness Pt is a 52 y.o. female with a known history of right breast cancer with metastases/probable tumor recurrence, anemia-status post 2 packed red blood cells transfusion within the last week, beta thalassemia, lupus, presenting with 1-2-week history of worsening breath, worse with recumbency, was seen by primary care provider 3 days ago-prescribed doxycycline/prednisone, no improvement, echocardiogram done recently noted for ejection fraction 45-50%, in the emergency room patient tachycardic, tachypneic, hypertensive, chest x-[name] noted for cardiomegaly with edema, troponin 0.1, CT chest noted for progression of interstitial disease/airspace disease for which could be related to infection/inflammation/edema/hemorrhage-CT report nonhelpful, patient evaluated in the emergency room, husband at the bedside, patient resting comfortably on BiPAP, noted Rales up to mid back bilaterally with bilateral lower extremity edema, worse with recumbency, positive orthopnea, patient is now been admitted for acute hypoxic respiratory failure and acute newly diagnosed systolic congestive heart failure exacerbation most likely exacerbated by recent blood transfusion.    PT Comments    Pt is making good progress towards goals with increased ambulation distance this session. Used RW for all mobility. Refuses further there-ex at this time. Really hopeful for home discharge this date. Will need RW.   Follow Up Recommendations  Home health PT     Equipment Recommendations  Rolling walker with 5" wheels    Recommendations for Other Services       Precautions / Restrictions Precautions Precautions: Fall Restrictions Weight Bearing Restrictions: No    Mobility  Bed Mobility Overal bed mobility: Modified Independent             General bed mobility comments:  Extremely slow  Transfers Overall transfer level: Needs assistance Equipment used: Rolling walker (2 wheeled) Transfers: Sit to/from Stand Sit to Stand: Supervision         General transfer comment: safe technique performed with upright posture. Extremely slow transfer  Ambulation/Gait Ambulation/Gait assistance: Min guard Ambulation Distance (Feet): 200 Feet Assistive device: Rolling walker (2 wheeled) Gait Pattern/deviations: Step-through pattern     General Gait Details: slow ambulation speed, use RW for ambulation with safe technique, no LOB noted. 2 laps performed around short nursing station   Stairs            Wheelchair Mobility    Modified Rankin (Stroke Patients Only)       Balance                                            Cognition Arousal/Alertness: Awake/alert Behavior During Therapy: WFL for tasks assessed/performed;Flat affect Overall Cognitive Status: Within Functional Limits for tasks assessed                                        Exercises Other Exercises Other Exercises: refuses ther-ex at this time    General Comments        Pertinent Vitals/Pain Pain Assessment: No/denies pain    Home Living                      Prior Function            PT Goals (current goals can now be found in the care plan section) Acute Rehab  PT Goals Patient Stated Goal: to return home with normal energy levels. PT Goal Formulation: With patient Time For Goal Achievement: 12/08/17 Potential to Achieve Goals: Good Progress towards PT goals: Progressing toward goals    Frequency    Min 2X/week      PT Plan Current plan remains appropriate    Co-evaluation              AM-PAC PT "6 Clicks" Daily Activity  Outcome Measure  Difficulty turning over in bed (including adjusting bedclothes, sheets and blankets)?: A Little Difficulty moving from lying on back to sitting on the side of the bed?  : A Little Difficulty sitting down on and standing up from a chair with arms (e.g., wheelchair, bedside commode, etc,.)?: A Little Help needed moving to and from a bed to chair (including a wheelchair)?: A Little Help needed walking in hospital room?: A Little Help needed climbing 3-5 steps with a railing? : A Little 6 Click Score: 18    End of Session Equipment Utilized During Treatment: Gait belt Activity Tolerance: Patient limited by fatigue Patient left: in bed;with bed alarm set Nurse Communication: Mobility status PT Visit Diagnosis: Unsteadiness on feet (R26.81);Muscle weakness (generalized) (M62.81)     Time: 8032-1224 PT Time Calculation (min) (ACUTE ONLY): 23 min  Charges:  $Gait Training: 23-37 mins                    G Codes:       Greggory Stallion, PT, DPT [phone]    Azaleah Usman 12/08/2017, 2:51 PM

## 2017-12-08 NOTE — Progress Notes (Signed)
Inpatient Diabetes Program Recommendations  AACE/ADA: New Consensus Statement on Inpatient Glycemic Control (2015)  Target Ranges:  Prepandial:   less than 140 mg/dL      Peak postprandial:   less than 180 mg/dL (1-2 hours)      Critically ill patients:  140 - 180 mg/dL   Results for NENE, ARANAS (MRN [id number]) as of 12/08/2017 12:07  Ref. Range 12/07/2017 07:56 12/07/2017 11:50 12/07/2017 17:19 12/07/2017 21:12  Glucose-Capillary Latest Ref Range: 65 - 99 mg/dL 227 (H)  Refused Novolog 210 (H)  Refused Novolog 146 (H)  1 unit Novolog 249 (H)  3 units Novolog   Results for MCKENNAH, KRETCHMER (MRN [id number]) as of 12/08/2017 12:07  Ref. Range 12/08/2017 07:57 12/08/2017 11:53  Glucose-Capillary Latest Ref Range: 65 - 99 mg/dL 184 (H)  2 units Novolog 223 (H)     Review of Glycemic Control  Diabetes history: No  Outpatient Diabetes medications: NA  Current orders: Novolog Sensitive Correction Scale/ SSI (0-9 units) TID AC + HS     Patient getting Solumedrol 60 mg Q24 hours.    Novolog SSI started yesterday.  MD- please consider increasing Novolog SSI to Moderate Correction Scale/ SSI (0-15 units) TID AC + HS    --Will follow patient during hospitalization--  Wyn Quaker RN, MSN, CDE Diabetes Coordinator Inpatient Glycemic Control Team Team Pager: [phone] (8a-5p)

## 2017-12-08 NOTE — Progress Notes (Signed)
Discharge instructions along with home medications and follow up gone over with patient. She verbalized that she understood instructions. 3 prescriptions given to patient. IV removed. Pt being discharged home on room air, no distress noted. Ammie Dalton, RN
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
## 2017-12-09 ENCOUNTER — Ambulatory Visit: Payer: 59 | Admitting: Family

## 2017-12-09 ENCOUNTER — Telehealth: Payer: Self-pay | Admitting: *Deleted

## 2017-12-09 LAB — VIRUS CULTURE

## 2017-12-09 NOTE — Telephone Encounter (Signed)
Lm requesting return call to complete TCM and confirm hosp f/u appt  

## 2017-12-10 NOTE — Telephone Encounter (Addendum)
Transition Care Management Follow-up Telephone Call   Date discharged? 12/08/2017   How have you been since you were released from the hospital? "Things are getting there"   Do you understand why you were in the hospital? Yes   Do you understand the discharge instructions? Yes   Where were you discharged to? Home with home care assistance.    Items Reviewed:  Medications reviewed: Yes  Allergies reviewed: Yes  Dietary changes reviewed: Yes  Referrals reviewed: Has appointments set up with cardiologist and cardiac rehab for next week.    Functional Questionnaire:   Activities of Daily Living (ADLs):   She states they are independent in the following: Ambulation with walker, toileting and feeding. States they require assistance with the following: Has home care aid assistance with dressing, bathing, meals   Any transportation issues/concerns?: No.  States that aides help her get to appointments.    Any patient concerns? No   Confirmed importance and date/time of follow-up visits scheduled Yes  Provider Appointment booked with Dr. Danise Mina for 12/16/17 at 9:30am.   Confirmed with patient if condition begins to worsen call PCP or go to the ER.  Patient was given the office number and encouraged to call back with question or concerns.  : Yes

## 2017-12-12 LAB — SURGICAL PATHOLOGY

## 2017-12-13 ENCOUNTER — Encounter: Payer: Self-pay | Admitting: Nephrology

## 2017-12-14 ENCOUNTER — Other Ambulatory Visit: Payer: Self-pay | Admitting: Family Medicine

## 2017-12-14 ENCOUNTER — Other Ambulatory Visit: Payer: Self-pay | Admitting: Oncology

## 2017-12-14 DIAGNOSIS — Z780 Asymptomatic menopausal state: Secondary | ICD-10-CM

## 2017-12-14 DIAGNOSIS — Z79811 Long term (current) use of aromatase inhibitors: Secondary | ICD-10-CM

## 2017-12-14 DIAGNOSIS — E2839 Other primary ovarian failure: Secondary | ICD-10-CM

## 2017-12-14 NOTE — Progress Notes (Signed)
This encounter was created in error - please disregard.

## 2017-12-15 ENCOUNTER — Telehealth: Payer: Self-pay | Admitting: Licensed Clinical Social Worker

## 2017-12-15 ENCOUNTER — Telehealth: Payer: Self-pay | Admitting: Family Medicine

## 2017-12-15 LAB — THIOPURINE METHYLTRANSFERASE (TPMT), RBC: TPMT ACTIVITY: 18.6 U/mL{RBCs}

## 2017-12-15 NOTE — Telephone Encounter (Signed)
FMLA papework in Dr Synthia Innocent in box Pt has hospital follow up with you 2/28

## 2017-12-15 NOTE — Telephone Encounter (Signed)
Clinical Education officer, museum (CSW) contacted patient via telephone because she answered Yes to loss of interest in things on the [facility] call. Patient answered the phone and stated that she is fine and is not feeling depressed. Patient reported that she had a lost of interest because of her recent hospitalization and her chronic illness. Patient reported that her feet are swelling and she has a doctors appointment tomorrow. Patient reported that she would be going to that appointment. Patient reported that she is also working on [employer] paper work and is waiting for her PCP to complete paper work. Patient reported that she is not having thoughts of hurting herself and she is just feeling overwhelmed because of her recent hospitalzation. CSW provided emotional support. No further call is needed.   [employer], LCSW [phone]

## 2017-12-16 ENCOUNTER — Ambulatory Visit (INDEPENDENT_AMBULATORY_CARE_PROVIDER_SITE_OTHER): Payer: 59 | Admitting: Family Medicine

## 2017-12-16 ENCOUNTER — Encounter: Payer: Self-pay | Admitting: Family Medicine

## 2017-12-16 VITALS — BP 134/82 | HR 100 | Temp 98.1°F | Wt 158.0 lb

## 2017-12-16 DIAGNOSIS — M3214 Glomerular disease in systemic lupus erythematosus: Secondary | ICD-10-CM | POA: Diagnosis not present

## 2017-12-16 DIAGNOSIS — J189 Pneumonia, unspecified organism: Secondary | ICD-10-CM | POA: Diagnosis not present

## 2017-12-16 DIAGNOSIS — R531 Weakness: Secondary | ICD-10-CM

## 2017-12-16 DIAGNOSIS — N183 Chronic kidney disease, stage 3 (moderate): Secondary | ICD-10-CM

## 2017-12-16 DIAGNOSIS — C50512 Malignant neoplasm of lower-outer quadrant of left female breast: Secondary | ICD-10-CM | POA: Diagnosis not present

## 2017-12-16 DIAGNOSIS — I5033 Acute on chronic diastolic (congestive) heart failure: Secondary | ICD-10-CM | POA: Diagnosis not present

## 2017-12-16 DIAGNOSIS — C771 Secondary and unspecified malignant neoplasm of intrathoracic lymph nodes: Secondary | ICD-10-CM

## 2017-12-16 DIAGNOSIS — D61818 Other pancytopenia: Secondary | ICD-10-CM | POA: Diagnosis not present

## 2017-12-16 DIAGNOSIS — Z170 Estrogen receptor positive status [ER+]: Secondary | ICD-10-CM

## 2017-12-16 DIAGNOSIS — R600 Localized edema: Secondary | ICD-10-CM | POA: Diagnosis not present

## 2017-12-16 DIAGNOSIS — N179 Acute kidney failure, unspecified: Secondary | ICD-10-CM

## 2017-12-16 MED ORDER — ALPRAZOLAM 0.25 MG PO TABS: 0.2500 mg | ORAL_TABLET | Freq: Two times a day (BID) | ORAL | 0 refills | Status: DC | PRN

## 2017-12-16 MED ORDER — FUROSEMIDE 20 MG PO TABS: 20.0000 mg | ORAL_TABLET | Freq: Every day | ORAL | 3 refills | Status: DC | PRN

## 2017-12-16 MED ORDER — ALBUTEROL SULFATE (2.5 MG/3ML) 0.083% IN NEBU: 2.5000 mg | INHALATION_SOLUTION | Freq: Four times a day (QID) | RESPIRATORY_TRACT | 1 refills | Status: DC | PRN

## 2017-12-16 NOTE — Patient Instructions (Addendum)
We will refer you to Dr Jefm Bryant rheumatology in [place].  Call to schedule appointment with kidney doctor.  Continue current medicines, medicine list updated.  Compression stocking Rx provided today Return to see me in 6 weeks for follow up visit.  We will set you up with [facility] physical therapy.  [place] - [phone].

## 2017-12-16 NOTE — Telephone Encounter (Signed)
Left message asking Wells Guiles @ [facility] to call office.  I wanted to confirm the fax number on cover sheet is a confidential fax number [phone]

## 2017-12-16 NOTE — Telephone Encounter (Signed)
Filled out and placed in my out box. plz fax in. Thanks.

## 2017-12-16 NOTE — Progress Notes (Addendum)
BP 134/82 (BP Location: Right Arm, Patient Position: Sitting, Cuff Size: Normal)   Pulse 100   Temp 98.1 F (36.7 C) (Oral)   Wt 158 lb (71.7 kg)   SpO2 100%   BMI 31.91 kg/m    CC: hosp f/u visit Subjective:    Patient ID: Melanie Holloway, female    DOB: December 20, 1965, 52 y.o.   MRN: [id number]  HPI: Melanie Holloway is a 52 y.o. female presenting on 12/16/2017 for Hospitalization Follow-up (Admitted to [facility] on 11/18/2017.  Pt states she was started on albuterol via neb BID but not sure of strength. Accompanied by friend, Melanie Holloway.)   Here with friend Melanie Holloway.   Complicated recent hospitalization (records reviewed) for acute respiratory failure found to have HCAP treated with IV zosyn/azithromycin seen by ID, bronchoscopy 11/30/2017 with negative cultures. She also had acute on chronic kidney disease (Cr peaked at 2.5) diagnosed with lupus nephritis by kidney biopsy (2/15) (preliminary renal biopsy results show immune complex GN/Membranous Lupus), started on prednisone and cellcept and paquenil seen by nephrology planned f/u outpatient with nephrology and rheumatology. Acute CHF exacerbation (EF 45-50%) thought related to recent blood transfusion did improve with IV diuresis. H/o metastatic breast cancer with new lymphadenopathy and pericardial effusion seen by oncology, with recent unrevealing biopsies, also with pancytopenia, planned f/u with oncology outpatient. It seems illness largely attributed to new systemic lupus diagnosis.   Sputum culture from bronchoalveolar lavage positive for candida but negative for bacteria, viral infection. Several recent biopsies negative for malignancy (R supraclavicular LN, lung). GI pathogen panel and respiratory panel negative for infection, blood cultures negative for infection.   It seems ID recommended bactrim single strength once daily for PCP ppx. I don't see where she's taking this. Will await renal eval   Planned f/u with onc (12/23/2017) and renal (pending)  and cards (tomorrow). No rheum f/u scheduled yet - will refer.   Continues coughing, with some blood tinged mucous. Bronchoscopy with biopsy was 11/30/2017. Ongoing leg swelling, worse since home. This is affecting ambulation. Ongoing fatigue. She is taking lasix 61m bid. Has not been set up with HHPT - requests referral today.   Since home endorses some palpitations, ongoing cough, some diarrhea (loose stools 2x/day), some back pain.  Since home denies chest pressure/tigthness, abd pain, nausea/vomiting.   Out of work since 11/16/2017.  TCM f/u phone call completed 12/10/2017 DATE OF ADMISSION:  11/18/2017  DATE OF DISCHARGE:  11/23/17 ?afterwards as well unclear from discharge summaries (2 in chart) pt states in hospital until discharged 12/09/2017  D/C Dx: Acute respiratory failure acute pulmonary edema Acute on chronic kidney injury with CKD stage 3 HCAP SLE Acute systolic CHF EF 436-62%Metastatic breast cancer stage IIIA HTN Weakness Pancytopenia  CONSULTS OBTAINED: Treatment Team:  PIsaias Cowman MD FLeonel Ramsay MD LAnthonette Legato MD KEmmaline Kluver, MD  Relevant past medical, surgical, family and social history reviewed and updated as indicated. Interim medical history since our last visit reviewed. Allergies and medications reviewed and updated. Outpatient Medications Prior to Visit  Medication Sig Dispense Refill  . acetaminophen (TYLENOL) 325 MG tablet Take 2 tablets (650 mg total) by mouth every 4 (four) hours as needed for headache or mild pain. 30 tablet 0  . albuterol (PROVENTIL HFA;VENTOLIN HFA) 108 (90 Base) MCG/ACT inhaler Inhale 2 puffs into the lungs every 4 (four) hours as needed for wheezing or shortness of breath. 8.5 Inhaler 0  . Cholecalciferol (VITAMIN D) 2000 UNITS CAPS Take 1 capsule (  2,000 Units total) by mouth daily. 30 capsule   . hydroxychloroquine (PLAQUENIL) 200 MG tablet Take 1 tablet (200 mg total) by mouth daily. 60 tablet 2    . letrozole (FEMARA) 2.5 MG tablet TAKE 1 TABLET BY MOUTH EVERY DAY 90 tablet 1  . metoprolol tartrate (LOPRESSOR) 25 MG tablet Take 0.5 tablets (12.5 mg total) by mouth 2 (two) times daily. 60 tablet 0  . Multiple Vitamin (MULTIVITAMIN WITH MINERALS) TABS tablet Take 1 tablet by mouth daily. 30 tablet 0  . mycophenolate (CELLCEPT) 250 MG capsule Take 2 capsules (500 mg total) by mouth 2 (two) times daily. (Patient not taking: Reported on 12/17/2017) 60 capsule 0  . nystatin (MYCOSTATIN) 100000 UNIT/ML suspension Take 5 mLs (500,000 Units total) by mouth 4 (four) times daily. 60 mL 0  . predniSONE (DELTASONE) 20 MG tablet Take 3 tablets (60 mg total) by mouth daily. 180 tablet 0  . protein supplement shake (PREMIER PROTEIN) LIQD Take 325 mLs (11 oz total) by mouth 2 (two) times daily between meals. 60 Can 0  . triamcinolone cream (KENALOG) 0.1 % Apply 1 application topically 2 (two) times daily. Apply to AA. 453.6 g 0  . ALPRAZolam (XANAX) 0.25 MG tablet Take 1 tablet (0.25 mg total) by mouth 2 (two) times daily as needed for anxiety. 30 tablet 0  . carvedilol (COREG) 12.5 MG tablet Take 1 tablet (12.5 mg total) by mouth 2 (two) times daily with a meal. 60 tablet 0  . feeding supplement, ENSURE ENLIVE, (ENSURE ENLIVE) LIQD Take 237 mLs by mouth 2 (two) times daily between meals. (Patient taking differently: Take 237 mLs by mouth as needed. ) 237 mL 12  . furosemide (LASIX) 20 MG tablet Take 1 tablet (20 mg total) by mouth as needed for fluid or edema (Check daily weights and take Lasix 20 mg 1 tablet for weight gain of 1-2 pounds in 1 day). 30 tablet 0  . letrozole (FEMARA) 2.5 MG tablet Take 2.5 mg by mouth daily.  1  . cloNIDine (CATAPRES - DOSED IN MG/24 HR) 0.2 mg/24hr patch PLACE 1 PATCH (0.2 MG TOTAL) ONTO THE SKIN ONCE A WEEK. (Patient not taking: Reported on 12/10/2017) 12 patch 0   No facility-administered medications prior to visit.      Per HPI unless specifically indicated in ROS section  below Review of Systems     Objective:    BP 134/82 (BP Location: Right Arm, Patient Position: Sitting, Cuff Size: Normal)   Pulse 100   Temp 98.1 F (36.7 C) (Oral)   Wt 158 lb (71.7 kg)   SpO2 100%   BMI 31.91 kg/m   Wt Readings from Last 3 Encounters:  12/17/17 158 lb (71.7 kg)  12/16/17 158 lb (71.7 kg)  12/08/17 151 lb 3.2 oz (68.6 kg)    Physical Exam  Constitutional: She appears well-developed and well-nourished. No distress.  HENT:  Mouth/Throat: Oropharynx is clear and moist. No oropharyngeal exudate.  Eyes: Conjunctivae are normal. Pupils are equal, round, and reactive to light.  Cardiovascular: Normal rate, regular rhythm, normal heart sounds and intact distal pulses.  No murmur heard. Pulmonary/Chest: Effort normal and breath sounds normal. No respiratory distress. She has no wheezes. She has no rales.  Abdominal: Soft. Bowel sounds are normal. She exhibits no distension and no mass. There is no rebound and no guarding.  Musculoskeletal: Normal range of motion. She exhibits edema (1+ pitting bilaterally).  Skin: Skin is warm and dry. No rash noted.  Nursing  note and vitals reviewed.   Lab Results  Component Value Date   CREATININE 1.52 (H) 12/07/2017   BUN 92 (H) 12/07/2017   NA 132 (L) 12/07/2017   K 3.9 12/07/2017   CL 106 12/07/2017   CO2 16 (L) 12/07/2017    Lab Results  Component Value Date   WBC 11.8 (H) 12/06/2017   HGB 9.6 (L) 12/06/2017   HCT 27.2 (L) 12/06/2017   MCV 85.4 12/06/2017   PLT 99 (L) 12/06/2017      Assessment & Plan:   Problem List Items Addressed This Visit    Acute on chronic diastolic heart failure ([place])    Now on lasix 49m daily. Has f/u with cards tomorrow.       Cancer of intrathoracic lymph nodes, secondary (HCC)    Intrathoracic lymph nodes, ?SLE related. F/u planned with rheum and onc.       Relevant Medications   ALPRAZolam (XANAX) 0.25 MG tablet   RESOLVED: HCAP (healthcare-associated pneumonia)     Completed IV abx. This has resolved.       Relevant Medications   albuterol (PROVENTIL) (2.5 MG/3ML) 0.083% nebulizer solution   Lupus nephritis, ISN/RPS class V ([place]    Appreciate renal care. Now on prednisone, cellcept and plaquenil.       Malignant neoplasm of lower-outer quadrant of left breast of female, estrogen receptor positive ([place]    Planned close f/u with onc - ?SLE related illness.       Relevant Medications   ALPRAZolam (XANAX) 0.25 MG tablet   Other [place]cytopenia ([place]    ?lupus related - planned f/u with onc.       Pedal edema    Worsening. Continue lasix 267mbid which effectively increases UOP.  Rx for compression stockings provided today, 15-2039m      Relevant Orders   [place]ulatory [place] to [facility] [place] [facility] ([place]  New diagnosis, with lupus nephritis. Now on cellcept, prednisone, plaquenil.  Has upcoming f/u with renal. Will refer back to rheum as well.        Other Visit Diagnoses    Generalized weakness       Relevant Orders   Ambulatory referral to [facility] [place] failure superimposed on stage 3 chronic kidney disease, unspecified acute renal failure type ([place]         Meds ordered this encounter  Medications  . ALPRAZolam (XANAX) 0.25 MG tablet    Sig: Take 1 tablet (0.25 mg total) by mouth 2 (two) times daily as needed for anxiety.    Dispense:  30 tablet    Refill:  0  . DISCONTD: furosemide (LASIX) 20 MG tablet    Sig: Take 1 tablet (20 mg total) by mouth daily as needed for edema (Check daily weights take Lasix 20 mg for weight gain of 1-2 pounds in 1d).    Dispense:  30 tablet    Refill:  3  . albuterol (PROVENTIL) (2.5 MG/3ML) 0.083% nebulizer solution    Sig: Take 3 mLs (2.5 mg total) by nebulization every 6 (six) hours as needed for wheezing or shortness of breath.    Dispense:  150 mL    Refill:  1   Orders Placed This Encounter  Procedures  . Ambulatory referral to  Rheumatology    Referral Priority:   Routine    Referral Type:   Consultation    Referral Reason:   Specialty Services Required  Requested Specialty:   Rheumatology    Number of Visits Requested:   1  . Ambulatory referral to Home Health    Referral Priority:   Routine    Referral Type:   Home Health Care    Referral Reason:   Specialty Services Required    Requested Specialty:   [place]    Number of Visits Requested:   1    Follow up plan: Return in about 6 weeks (around 01/27/2018) for follow up visit.  Ria Bush, MD

## 2017-12-17 ENCOUNTER — Encounter: Payer: Self-pay | Admitting: Family

## 2017-12-17 ENCOUNTER — Ambulatory Visit: Payer: 59 | Attending: Family | Admitting: Family

## 2017-12-17 VITALS — BP 144/93 | HR 98 | Resp 18 | Ht 59.0 in | Wt 158.0 lb

## 2017-12-17 DIAGNOSIS — I5032 Chronic diastolic (congestive) heart failure: Secondary | ICD-10-CM

## 2017-12-17 DIAGNOSIS — I5033 Acute on chronic diastolic (congestive) heart failure: Secondary | ICD-10-CM | POA: Insufficient documentation

## 2017-12-17 DIAGNOSIS — D649 Anemia, unspecified: Secondary | ICD-10-CM | POA: Insufficient documentation

## 2017-12-17 DIAGNOSIS — C50919 Malignant neoplasm of unspecified site of unspecified female breast: Secondary | ICD-10-CM | POA: Insufficient documentation

## 2017-12-17 DIAGNOSIS — E559 Vitamin D deficiency, unspecified: Secondary | ICD-10-CM | POA: Diagnosis not present

## 2017-12-17 DIAGNOSIS — Z79899 Other long term (current) drug therapy: Secondary | ICD-10-CM | POA: Insufficient documentation

## 2017-12-17 DIAGNOSIS — I10 Essential (primary) hypertension: Secondary | ICD-10-CM

## 2017-12-17 DIAGNOSIS — I890 Lymphedema, not elsewhere classified: Secondary | ICD-10-CM | POA: Diagnosis not present

## 2017-12-17 DIAGNOSIS — N189 Chronic kidney disease, unspecified: Secondary | ICD-10-CM | POA: Insufficient documentation

## 2017-12-17 DIAGNOSIS — M3214 Glomerular disease in systemic lupus erythematosus: Secondary | ICD-10-CM | POA: Insufficient documentation

## 2017-12-17 DIAGNOSIS — E669 Obesity, unspecified: Secondary | ICD-10-CM | POA: Insufficient documentation

## 2017-12-17 DIAGNOSIS — Z170 Estrogen receptor positive status [ER+]: Secondary | ICD-10-CM

## 2017-12-17 DIAGNOSIS — R5383 Other fatigue: Secondary | ICD-10-CM | POA: Diagnosis present

## 2017-12-17 DIAGNOSIS — I130 Hypertensive heart and chronic kidney disease with heart failure and stage 1 through stage 4 chronic kidney disease, or unspecified chronic kidney disease: Secondary | ICD-10-CM | POA: Insufficient documentation

## 2017-12-17 DIAGNOSIS — C50512 Malignant neoplasm of lower-outer quadrant of left female breast: Secondary | ICD-10-CM

## 2017-12-17 MED ORDER — FUROSEMIDE 20 MG PO TABS: 40.0000 mg | ORAL_TABLET | Freq: Every day | ORAL | 5 refills | Status: DC

## 2017-12-17 NOTE — Telephone Encounter (Signed)
PAPERWORK FAXED

## 2017-12-17 NOTE — Patient Instructions (Addendum)
Begin weighing daily and call for an overnight weight gain of > 2 pounds or a weekly weight gain of >5 pounds. 
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
## 2017-12-18 ENCOUNTER — Encounter: Payer: Self-pay | Admitting: Family Medicine

## 2017-12-18 DIAGNOSIS — M329 Systemic lupus erythematosus, unspecified: Secondary | ICD-10-CM | POA: Insufficient documentation

## 2017-12-18 DIAGNOSIS — IMO0002 Reserved for concepts with insufficient information to code with codable children: Secondary | ICD-10-CM | POA: Insufficient documentation

## 2017-12-18 DIAGNOSIS — J189 Pneumonia, unspecified organism: Secondary | ICD-10-CM | POA: Insufficient documentation

## 2017-12-18 NOTE — Assessment & Plan Note (Addendum)
New diagnosis, with lupus nephritis. Now on cellcept, prednisone, plaquenil.  Has upcoming f/u with renal. Will refer back to rheum as well.

## 2017-12-18 NOTE — Assessment & Plan Note (Addendum)
Now on lasix 20mg  daily. Has f/u with cards tomorrow.

## 2017-12-18 NOTE — Assessment & Plan Note (Signed)
Appreciate renal care. Now on prednisone, cellcept and plaquenil.

## 2017-12-18 NOTE — Assessment & Plan Note (Addendum)
Worsening. Continue lasix 20mg  bid which effectively increases UOP.  Rx for compression stockings provided today, 15-8mmHg

## 2017-12-18 NOTE — Assessment & Plan Note (Addendum)
Planned close f/u with onc - ?SLE related illness.

## 2017-12-18 NOTE — Assessment & Plan Note (Signed)
Completed IV abx. This has resolved.

## 2017-12-18 NOTE — Assessment & Plan Note (Signed)
Intrathoracic lymph nodes, ?SLE related. F/u planned with rheum and onc.

## 2017-12-18 NOTE — Assessment & Plan Note (Signed)
?  lupus related - planned f/u with onc.

## 2017-12-20 ENCOUNTER — Other Ambulatory Visit: Payer: Self-pay | Admitting: Oncology

## 2017-12-20 DIAGNOSIS — R0602 Shortness of breath: Secondary | ICD-10-CM | POA: Diagnosis not present

## 2017-12-20 DIAGNOSIS — C7951 Secondary malignant neoplasm of bone: Secondary | ICD-10-CM | POA: Diagnosis not present

## 2017-12-20 DIAGNOSIS — I313 Pericardial effusion (noninflammatory): Secondary | ICD-10-CM | POA: Diagnosis not present

## 2017-12-20 DIAGNOSIS — R918 Other nonspecific abnormal finding of lung field: Secondary | ICD-10-CM | POA: Diagnosis not present

## 2017-12-20 NOTE — Telephone Encounter (Signed)
Pt aware °Copy for scan °Copy for pt °

## 2017-12-21 DIAGNOSIS — I130 Hypertensive heart and chronic kidney disease with heart failure and stage 1 through stage 4 chronic kidney disease, or unspecified chronic kidney disease: Secondary | ICD-10-CM | POA: Diagnosis not present

## 2017-12-21 DIAGNOSIS — J90 Pleural effusion, not elsewhere classified: Secondary | ICD-10-CM | POA: Diagnosis not present

## 2017-12-21 DIAGNOSIS — I313 Pericardial effusion (noninflammatory): Secondary | ICD-10-CM | POA: Diagnosis not present

## 2017-12-21 DIAGNOSIS — J810 Acute pulmonary edema: Secondary | ICD-10-CM | POA: Diagnosis not present

## 2017-12-21 DIAGNOSIS — Z452 Encounter for adjustment and management of vascular access device: Secondary | ICD-10-CM | POA: Diagnosis not present

## 2017-12-21 DIAGNOSIS — J9601 Acute respiratory failure with hypoxia: Secondary | ICD-10-CM | POA: Diagnosis not present

## 2017-12-21 DIAGNOSIS — M7989 Other specified soft tissue disorders: Secondary | ICD-10-CM | POA: Diagnosis not present

## 2017-12-21 DIAGNOSIS — I509 Heart failure, unspecified: Secondary | ICD-10-CM | POA: Diagnosis not present

## 2017-12-21 DIAGNOSIS — E8770 Fluid overload, unspecified: Secondary | ICD-10-CM | POA: Diagnosis not present

## 2017-12-21 DIAGNOSIS — R0902 Hypoxemia: Secondary | ICD-10-CM | POA: Diagnosis not present

## 2017-12-21 DIAGNOSIS — R0600 Dyspnea, unspecified: Secondary | ICD-10-CM | POA: Diagnosis not present

## 2017-12-21 DIAGNOSIS — E872 Acidosis: Secondary | ICD-10-CM | POA: Diagnosis not present

## 2017-12-21 DIAGNOSIS — I5043 Acute on chronic combined systolic (congestive) and diastolic (congestive) heart failure: Secondary | ICD-10-CM | POA: Diagnosis not present

## 2017-12-21 DIAGNOSIS — I5020 Unspecified systolic (congestive) heart failure: Secondary | ICD-10-CM | POA: Diagnosis not present

## 2017-12-21 DIAGNOSIS — M3214 Glomerular disease in systemic lupus erythematosus: Secondary | ICD-10-CM | POA: Diagnosis not present

## 2017-12-21 DIAGNOSIS — R0602 Shortness of breath: Secondary | ICD-10-CM | POA: Diagnosis not present

## 2017-12-21 DIAGNOSIS — J9691 Respiratory failure, unspecified with hypoxia: Secondary | ICD-10-CM | POA: Diagnosis not present

## 2017-12-21 DIAGNOSIS — R918 Other nonspecific abnormal finding of lung field: Secondary | ICD-10-CM | POA: Diagnosis not present

## 2017-12-21 LAB — CULTURE, FUNGUS WITHOUT SMEAR

## 2017-12-22 NOTE — Progress Notes (Signed)
Melanie Holloway was not able to show for her visit on 12/23/2017 because she was admitted to [facility] on 12/20/2017.  Since her last visit here her diagnosis of systemic lupus erythematosus was confirmed.  She also had developed significant heart failure with an ejection fraction in the 20-25% range.  She has significant lupus nephritis.  Her hemoglobin was down to 5.9.  She is being adequately cared for by the [facility] team.  At this point there has been no evidence of breast cancer recurrence to my knowledge.  She will return to see me sometime in June.

## 2017-12-23 ENCOUNTER — Other Ambulatory Visit: Payer: 59

## 2017-12-23 ENCOUNTER — Ambulatory Visit: Payer: 59 | Admitting: Oncology

## 2017-12-23 MED ORDER — CARVEDILOL 6.25 MG PO TABS
6.25 mg | ORAL_TABLET | ORAL | Status: DC
Start: 2017-12-23 — End: 2017-12-23

## 2017-12-23 MED ORDER — HYDROXYCHLOROQUINE SULFATE 200 MG PO TABS
200.00 mg | ORAL_TABLET | ORAL | Status: DC
Start: 2017-12-29 — End: 2017-12-23

## 2017-12-23 MED ORDER — PREDNISONE 20 MG PO TABS
60.00 mg | ORAL_TABLET | ORAL | Status: DC
Start: 2017-12-24 — End: 2017-12-23

## 2017-12-23 MED ORDER — GENERIC EXTERNAL MEDICATION
1.00 | Status: DC
Start: ? — End: 2017-12-23

## 2017-12-23 MED ORDER — ENOXAPARIN SODIUM 40 MG/0.4ML ~~LOC~~ SOLN
40.00 mg | SUBCUTANEOUS | Status: DC
Start: 2017-12-24 — End: 2017-12-23

## 2017-12-23 MED ORDER — HYDRALAZINE HCL 50 MG PO TABS
50.00 mg | ORAL_TABLET | ORAL | Status: DC
Start: 2017-12-23 — End: 2017-12-23

## 2017-12-23 MED ORDER — POLYETHYLENE GLYCOL 3350 17 G PO PACK
17.00 | PACK | ORAL | Status: DC
Start: ? — End: 2017-12-23

## 2017-12-23 MED ORDER — SENNOSIDES 8.6 MG PO TABS
2.00 | ORAL_TABLET | ORAL | Status: DC
Start: ? — End: 2017-12-23

## 2017-12-23 MED ORDER — ALBUTEROL SULFATE (2.5 MG/3ML) 0.083% IN NEBU
2.50 mg | INHALATION_SOLUTION | RESPIRATORY_TRACT | Status: DC
Start: ? — End: 2017-12-23

## 2017-12-23 MED ORDER — IPRATROPIUM BROMIDE 0.02 % IN SOLN
500.00 | RESPIRATORY_TRACT | Status: DC
Start: ? — End: 2017-12-23

## 2017-12-23 MED ORDER — FLUCONAZOLE 200 MG PO TABS
200.00 mg | ORAL_TABLET | ORAL | Status: DC
Start: 2017-12-24 — End: 2017-12-23

## 2017-12-23 MED ORDER — ACETAMINOPHEN 325 MG PO TABS
650.00 mg | ORAL_TABLET | ORAL | Status: DC
Start: ? — End: 2017-12-23

## 2017-12-23 MED ORDER — ISOSORBIDE DINITRATE 20 MG PO TABS
20.00 mg | ORAL_TABLET | ORAL | Status: DC
Start: 2017-12-23 — End: 2017-12-23

## 2017-12-23 MED ORDER — SULFAMETHOXAZOLE-TRIMETHOPRIM 800-160 MG PO TABS
1.00 | ORAL_TABLET | ORAL | Status: DC
Start: 2017-12-29 — End: 2017-12-23

## 2017-12-27 ENCOUNTER — Encounter: Payer: Self-pay | Admitting: Nephrology

## 2017-12-28 ENCOUNTER — Telehealth: Payer: Self-pay

## 2017-12-28 NOTE — Telephone Encounter (Signed)
Received a faxed Request to Close Potential Gap in Therapy from [facility].    Placed in Dr. Synthia Innocent box.

## 2017-12-28 NOTE — Telephone Encounter (Signed)
Filled out
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
## 2017-12-30 ENCOUNTER — Ambulatory Visit: Payer: 59 | Attending: Family | Admitting: Family

## 2017-12-30 ENCOUNTER — Encounter: Payer: Self-pay | Admitting: Family

## 2017-12-30 VITALS — BP 137/90 | HR 125 | Resp 18 | Ht 59.0 in | Wt 140.1 lb

## 2017-12-30 DIAGNOSIS — M3214 Glomerular disease in systemic lupus erythematosus: Secondary | ICD-10-CM | POA: Diagnosis not present

## 2017-12-30 DIAGNOSIS — I10 Essential (primary) hypertension: Secondary | ICD-10-CM

## 2017-12-30 DIAGNOSIS — Z79899 Other long term (current) drug therapy: Secondary | ICD-10-CM | POA: Insufficient documentation

## 2017-12-30 DIAGNOSIS — N179 Acute kidney failure, unspecified: Secondary | ICD-10-CM | POA: Diagnosis not present

## 2017-12-30 DIAGNOSIS — E669 Obesity, unspecified: Secondary | ICD-10-CM | POA: Insufficient documentation

## 2017-12-30 DIAGNOSIS — Z9889 Other specified postprocedural states: Secondary | ICD-10-CM | POA: Insufficient documentation

## 2017-12-30 DIAGNOSIS — Z170 Estrogen receptor positive status [ER+]: Secondary | ICD-10-CM | POA: Diagnosis not present

## 2017-12-30 DIAGNOSIS — Z833 Family history of diabetes mellitus: Secondary | ICD-10-CM | POA: Insufficient documentation

## 2017-12-30 DIAGNOSIS — E559 Vitamin D deficiency, unspecified: Secondary | ICD-10-CM | POA: Diagnosis not present

## 2017-12-30 DIAGNOSIS — C50919 Malignant neoplasm of unspecified site of unspecified female breast: Secondary | ICD-10-CM | POA: Insufficient documentation

## 2017-12-30 DIAGNOSIS — Z803 Family history of malignant neoplasm of breast: Secondary | ICD-10-CM | POA: Insufficient documentation

## 2017-12-30 DIAGNOSIS — I890 Lymphedema, not elsewhere classified: Secondary | ICD-10-CM | POA: Diagnosis not present

## 2017-12-30 DIAGNOSIS — I5032 Chronic diastolic (congestive) heart failure: Secondary | ICD-10-CM | POA: Insufficient documentation

## 2017-12-30 DIAGNOSIS — M3219 Other organ or system involvement in systemic lupus erythematosus: Secondary | ICD-10-CM | POA: Diagnosis not present

## 2017-12-30 DIAGNOSIS — N189 Chronic kidney disease, unspecified: Secondary | ICD-10-CM | POA: Insufficient documentation

## 2017-12-30 DIAGNOSIS — Z9012 Acquired absence of left breast and nipple: Secondary | ICD-10-CM | POA: Insufficient documentation

## 2017-12-30 DIAGNOSIS — Z9851 Tubal ligation status: Secondary | ICD-10-CM | POA: Insufficient documentation

## 2017-12-30 DIAGNOSIS — I110 Hypertensive heart disease with heart failure: Secondary | ICD-10-CM | POA: Diagnosis not present

## 2017-12-30 DIAGNOSIS — I5042 Chronic combined systolic (congestive) and diastolic (congestive) heart failure: Secondary | ICD-10-CM | POA: Insufficient documentation

## 2017-12-30 DIAGNOSIS — D649 Anemia, unspecified: Secondary | ICD-10-CM | POA: Diagnosis not present

## 2017-12-30 DIAGNOSIS — C50512 Malignant neoplasm of lower-outer quadrant of left female breast: Secondary | ICD-10-CM

## 2017-12-30 DIAGNOSIS — I130 Hypertensive heart and chronic kidney disease with heart failure and stage 1 through stage 4 chronic kidney disease, or unspecified chronic kidney disease: Secondary | ICD-10-CM | POA: Insufficient documentation

## 2017-12-30 DIAGNOSIS — I5033 Acute on chronic diastolic (congestive) heart failure: Secondary | ICD-10-CM | POA: Diagnosis not present

## 2017-12-30 MED ORDER — OXYMETAZOLINE HCL 0.05 % NA SOLN
3.00 | NASAL | Status: DC
Start: ? — End: 2017-12-30

## 2017-12-30 MED ORDER — LOSARTAN POTASSIUM 25 MG PO TABS
50.00 mg | ORAL_TABLET | ORAL | Status: DC
Start: 2017-12-29 — End: 2017-12-30

## 2017-12-30 MED ORDER — MYCOPHENOLATE MOFETIL 500 MG PO TABS
500.00 mg | ORAL_TABLET | ORAL | Status: DC
Start: 2017-12-28 — End: 2017-12-30

## 2017-12-30 MED ORDER — FUROSEMIDE 80 MG PO TABS
80.00 mg | ORAL_TABLET | ORAL | Status: DC
Start: 2017-12-29 — End: 2017-12-30

## 2017-12-30 MED ORDER — POLYETHYLENE GLYCOL 3350 17 G PO PACK
17.00 | PACK | ORAL | Status: DC
Start: 2017-12-29 — End: 2017-12-30

## 2017-12-30 MED ORDER — SENNOSIDES 8.6 MG PO TABS
2.00 | ORAL_TABLET | ORAL | Status: DC
Start: 2017-12-28 — End: 2017-12-30

## 2017-12-30 MED ORDER — MELATONIN 3 MG PO TABS
3.00 mg | ORAL_TABLET | ORAL | Status: DC
Start: ? — End: 2017-12-30

## 2017-12-30 MED ORDER — CARVEDILOL 12.5 MG PO TABS
12.50 mg | ORAL_TABLET | ORAL | Status: DC
Start: 2017-12-28 — End: 2017-12-30

## 2017-12-30 MED ORDER — DEXTROSE 50 % IV SOLN
12.50 | INTRAVENOUS | Status: DC
Start: ? — End: 2017-12-30

## 2017-12-30 MED ORDER — PREDNISONE 20 MG PO TABS
40.00 mg | ORAL_TABLET | ORAL | Status: DC
Start: 2017-12-29 — End: 2017-12-30

## 2017-12-30 MED ORDER — CHOLECALCIFEROL 25 MCG (1000 UT) PO TABS
1000.00 | ORAL_TABLET | ORAL | Status: DC
Start: 2017-12-28 — End: 2017-12-30

## 2017-12-30 MED ORDER — INSULIN LISPRO 100 UNIT/ML ~~LOC~~ SOLN
0.00 | SUBCUTANEOUS | Status: DC
Start: 2017-12-28 — End: 2017-12-30

## 2017-12-30 MED ORDER — MAGNESIUM OXIDE 400 MG PO TABS
400.00 mg | ORAL_TABLET | ORAL | Status: DC
Start: 2017-12-29 — End: 2017-12-30

## 2017-12-30 MED ORDER — GENERIC EXTERNAL MEDICATION
Status: DC
Start: 2017-12-28 — End: 2017-12-30

## 2017-12-30 NOTE — Patient Instructions (Signed)
Continue weighing daily and call for an overnight weight gain of > 2 pounds or a weekly weight gain of >5 pounds. 

## 2017-12-31 ENCOUNTER — Telehealth: Payer: Self-pay | Admitting: Family Medicine

## 2017-12-31 NOTE — Telephone Encounter (Signed)
Copied from [place]. Topic: Quick Communication - See Telephone Encounter >> Dec 31, 2017 10:38 AM Burnis Medin, NT wrote: CRM for notification. See Telephone encounter for: Amy is calling to get verbal orders for approval for plan of care. Plan of care is once a week for 1 week twice a week 1 week and once a week for two weeks. Pls call [phone]  12/31/17.

## 2017-12-31 NOTE — Telephone Encounter (Signed)
Left message for Melanie Holloway informing her Dr. Darnell Level agrees to verbal order for plan of care for pt.

## 2017-12-31 NOTE — Telephone Encounter (Signed)
Agree with this. Thanks.  

## 2018-01-04 ENCOUNTER — Telehealth: Payer: Self-pay | Admitting: Family Medicine

## 2018-01-04 DIAGNOSIS — I5033 Acute on chronic diastolic (congestive) heart failure: Secondary | ICD-10-CM | POA: Diagnosis not present

## 2018-01-04 DIAGNOSIS — M3219 Other organ or system involvement in systemic lupus erythematosus: Secondary | ICD-10-CM | POA: Diagnosis not present

## 2018-01-04 DIAGNOSIS — I110 Hypertensive heart disease with heart failure: Secondary | ICD-10-CM | POA: Diagnosis not present

## 2018-01-04 NOTE — Telephone Encounter (Signed)
Agree with this. Thanks.  

## 2018-01-04 NOTE — Telephone Encounter (Signed)
Spoke with Melanie Holloway informing her Dr. Darnell Level agrees to PT order.

## 2018-01-04 NOTE — Telephone Encounter (Signed)
Copied from [place]. Topic: Quick Communication - See Telephone Encounter >> Jan 04, 2018  8:50 AM Corie Chiquito, NT wrote: CRM for notification. Melanie Holloway is calling because she needs a verbal order for the patient to receive PT 2 times a week for 2 weeks. If someone could give her a call back about this at [phone]

## 2018-01-05 ENCOUNTER — Other Ambulatory Visit: Payer: Self-pay | Admitting: Family Medicine

## 2018-01-06 DIAGNOSIS — I110 Hypertensive heart disease with heart failure: Secondary | ICD-10-CM | POA: Diagnosis not present

## 2018-01-06 DIAGNOSIS — I5033 Acute on chronic diastolic (congestive) heart failure: Secondary | ICD-10-CM | POA: Diagnosis not present

## 2018-01-06 DIAGNOSIS — M3219 Other organ or system involvement in systemic lupus erythematosus: Secondary | ICD-10-CM | POA: Diagnosis not present

## 2018-01-10 ENCOUNTER — Encounter: Payer: Self-pay | Admitting: Adult Health

## 2018-01-10 ENCOUNTER — Telehealth: Payer: Self-pay | Admitting: *Deleted

## 2018-01-10 ENCOUNTER — Telehealth: Payer: Self-pay | Admitting: Oncology

## 2018-01-10 ENCOUNTER — Inpatient Hospital Stay (HOSPITAL_BASED_OUTPATIENT_CLINIC_OR_DEPARTMENT_OTHER): Payer: 59 | Admitting: Adult Health

## 2018-01-10 ENCOUNTER — Inpatient Hospital Stay: Payer: 59 | Attending: Oncology

## 2018-01-10 VITALS — BP 122/78 | HR 113 | Temp 98.6°F | Resp 18 | Ht 59.0 in | Wt 145.5 lb

## 2018-01-10 DIAGNOSIS — Z9012 Acquired absence of left breast and nipple: Secondary | ICD-10-CM | POA: Diagnosis not present

## 2018-01-10 DIAGNOSIS — Z79811 Long term (current) use of aromatase inhibitors: Secondary | ICD-10-CM | POA: Insufficient documentation

## 2018-01-10 DIAGNOSIS — I3131 Malignant pericardial effusion in diseases classified elsewhere: Secondary | ICD-10-CM

## 2018-01-10 DIAGNOSIS — D61818 Other pancytopenia: Secondary | ICD-10-CM

## 2018-01-10 DIAGNOSIS — C50512 Malignant neoplasm of lower-outer quadrant of left female breast: Secondary | ICD-10-CM | POA: Diagnosis not present

## 2018-01-10 DIAGNOSIS — I313 Pericardial effusion (noninflammatory): Secondary | ICD-10-CM

## 2018-01-10 DIAGNOSIS — C7951 Secondary malignant neoplasm of bone: Secondary | ICD-10-CM

## 2018-01-10 DIAGNOSIS — Z170 Estrogen receptor positive status [ER+]: Secondary | ICD-10-CM | POA: Insufficient documentation

## 2018-01-10 DIAGNOSIS — C801 Malignant (primary) neoplasm, unspecified: Secondary | ICD-10-CM

## 2018-01-10 DIAGNOSIS — J8489 Other specified interstitial pulmonary diseases: Secondary | ICD-10-CM

## 2018-01-10 DIAGNOSIS — M8580 Other specified disorders of bone density and structure, unspecified site: Secondary | ICD-10-CM | POA: Diagnosis not present

## 2018-01-10 DIAGNOSIS — C771 Secondary and unspecified malignant neoplasm of intrathoracic lymph nodes: Secondary | ICD-10-CM

## 2018-01-10 DIAGNOSIS — D638 Anemia in other chronic diseases classified elsewhere: Secondary | ICD-10-CM

## 2018-01-10 LAB — CBC WITH DIFFERENTIAL/PLATELET
BASOS PCT: 0 %
Basophils Absolute: 0 10*3/uL (ref 0.0–0.1)
EOS ABS: 0 10*3/uL (ref 0.0–0.5)
Eosinophils Relative: 0 %
HCT: 24.5 % — ABNORMAL LOW (ref 34.8–46.6)
HEMOGLOBIN: 7.9 g/dL — AB (ref 11.6–15.9)
Lymphocytes Relative: 25 %
Lymphs Abs: 1.3 10*3/uL (ref 0.9–3.3)
MCH: 29.8 pg (ref 25.1–34.0)
MCHC: 32.2 g/dL (ref 31.5–36.0)
MCV: 92.5 fL (ref 79.5–101.0)
Monocytes Absolute: 0.2 10*3/uL (ref 0.1–0.9)
Monocytes Relative: 4 %
NEUTROS PCT: 71 %
Neutro Abs: 3.7 10*3/uL (ref 1.5–6.5)
Platelets: 141 10*3/uL — ABNORMAL LOW (ref 145–400)
RBC: 2.65 MIL/uL — AB (ref 3.70–5.45)
RDW: 17.7 % — ABNORMAL HIGH (ref 11.2–14.5)
WBC: 5.2 10*3/uL (ref 3.9–10.3)

## 2018-01-10 LAB — COMPREHENSIVE METABOLIC PANEL
ALK PHOS: 229 U/L — AB (ref 40–150)
ALT: 50 U/L (ref 0–55)
AST: 24 U/L (ref 5–34)
Albumin: 2 g/dL — ABNORMAL LOW (ref 3.5–5.0)
Anion gap: 9 (ref 3–11)
BUN: 41 mg/dL — ABNORMAL HIGH (ref 7–26)
CALCIUM: 8 mg/dL — AB (ref 8.4–10.4)
CHLORIDE: 102 mmol/L (ref 98–109)
CO2: 23 mmol/L (ref 22–29)
CREATININE: 1.04 mg/dL (ref 0.60–1.10)
GFR calc Af Amer: >60 mL/min (ref >60)
Glucose, Bld: 157 mg/dL — ABNORMAL HIGH (ref 70–140)
Potassium: 4 mmol/L (ref 3.5–5.1)
Sodium: 134 mmol/L — ABNORMAL LOW (ref 136–145)
Total Bilirubin: 0.5 mg/dL (ref 0.2–1.2)
Total Protein: 5.1 g/dL — ABNORMAL LOW (ref 6.4–8.3)
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
## 2018-01-10 NOTE — Telephone Encounter (Signed)
Gave avs and calendar ° °

## 2018-01-10 NOTE — Telephone Encounter (Signed)
This RN spoke with pt per her VM- she states she called to [facility] " but they did not have any information on me and couldn't make an appointment - so then I called Dr Jefm Bryant and he will see me this Thursday "  Above message will be forwarded to MD and NP for communication.

## 2018-01-11 DIAGNOSIS — M3219 Other organ or system involvement in systemic lupus erythematosus: Secondary | ICD-10-CM | POA: Diagnosis not present

## 2018-01-11 DIAGNOSIS — I110 Hypertensive heart disease with heart failure: Secondary | ICD-10-CM | POA: Diagnosis not present

## 2018-01-11 DIAGNOSIS — I5033 Acute on chronic diastolic (congestive) heart failure: Secondary | ICD-10-CM | POA: Diagnosis not present

## 2018-01-11 LAB — CANCER ANTIGEN 27.29: CAN 27.29: 61.5 U/mL — AB (ref 0.0–38.6)

## 2018-01-12 ENCOUNTER — Telehealth: Payer: Self-pay | Admitting: Family

## 2018-01-12 ENCOUNTER — Other Ambulatory Visit: Payer: Self-pay | Admitting: Oncology

## 2018-01-12 LAB — ACID FAST CULTURE WITH REFLEXED SENSITIVITIES: ACID FAST CULTURE - AFSCU3: NEGATIVE

## 2018-01-12 LAB — ACID FAST CULTURE WITH REFLEXED SENSITIVITIES (MYCOBACTERIA)

## 2018-01-12 MED ORDER — METOPROLOL SUCCINATE ER 25 MG PO TB24: 25.0000 mg | ORAL_TABLET | Freq: Every day | ORAL | 5 refills | Status: DC

## 2018-01-12 MED ORDER — FUROSEMIDE 40 MG PO TABS: 80.0000 mg | ORAL_TABLET | Freq: Every day | ORAL | 0 refills | Status: DC

## 2018-01-12 MED ORDER — SULFAMETHOXAZOLE-TRIMETHOPRIM 800-160 MG PO TABS: 1.0000 | ORAL_TABLET | ORAL | 0 refills | Status: DC

## 2018-01-12 MED ORDER — LOSARTAN POTASSIUM 50 MG PO TABS: 50.0000 mg | ORAL_TABLET | Freq: Every day | ORAL | 0 refills | Status: DC

## 2018-01-12 MED ORDER — MAGNESIUM 400 MG PO CAPS: 400.0000 mg | ORAL_CAPSULE | Freq: Every day | ORAL | 0 refills | Status: DC

## 2018-01-12 MED ORDER — PREDNISONE 10 MG PO TABS: 10.0000 mg | ORAL_TABLET | Freq: Every day | ORAL | 0 refills | Status: DC

## 2018-01-12 NOTE — Telephone Encounter (Signed)
Colletta Maryland, nurse from [place], called yesterday regarding patient's heart rate being in the 120's. Per our med list, she is supposed to be on metoprolol but Colletta Maryland says that patient does not have that medication. Asked her to fax [place] a list of patient's current medications as patient doesn't bring her bottles or a list when she comes to her appointments.  Received fax this morning and their medication list and what we have listed is completely different. Will begin metoprolol succinate 25mg  once daily. She has follow-up with her PCP 01/27/18 that she must keep to see how the beta-blocker is working for her.

## 2018-01-13 DIAGNOSIS — R600 Localized edema: Secondary | ICD-10-CM | POA: Diagnosis not present

## 2018-01-13 DIAGNOSIS — I5033 Acute on chronic diastolic (congestive) heart failure: Secondary | ICD-10-CM | POA: Diagnosis not present

## 2018-01-13 DIAGNOSIS — R21 Rash and other nonspecific skin eruption: Secondary | ICD-10-CM | POA: Diagnosis not present

## 2018-01-13 DIAGNOSIS — I110 Hypertensive heart disease with heart failure: Secondary | ICD-10-CM | POA: Diagnosis not present

## 2018-01-13 DIAGNOSIS — M3214 Glomerular disease in systemic lupus erythematosus: Secondary | ICD-10-CM | POA: Diagnosis not present

## 2018-01-13 DIAGNOSIS — M3219 Other organ or system involvement in systemic lupus erythematosus: Secondary | ICD-10-CM | POA: Diagnosis not present

## 2018-01-14 ENCOUNTER — Telehealth: Payer: Self-pay

## 2018-01-14 NOTE — Telephone Encounter (Signed)
Called to advise patient that Darylene Price FNP would like to have her on Metoprolol Succinate 25 mg once daily due to her elevated heart rate which has been in the 120's. The patient states that she does not believe she needs to take it because "it fluctuates".   Spoke with Darylene Price about patients resistance to starting medication and she advised to explain to patient that her elevated heartrate puts strain on her heart but it is her choice as to weather or not she takes the medication.   I relayed message to patient who then said to go ahead and send the prescription in. Advised that she had a prescription at [facility] [place] and to let us know if she stops taking this medication.

## 2018-01-18 DIAGNOSIS — M3219 Other organ or system involvement in systemic lupus erythematosus: Secondary | ICD-10-CM | POA: Diagnosis not present

## 2018-01-18 DIAGNOSIS — I5033 Acute on chronic diastolic (congestive) heart failure: Secondary | ICD-10-CM | POA: Diagnosis not present

## 2018-01-18 DIAGNOSIS — C50512 Malignant neoplasm of lower-outer quadrant of left female breast: Secondary | ICD-10-CM | POA: Diagnosis not present

## 2018-01-18 DIAGNOSIS — I110 Hypertensive heart disease with heart failure: Secondary | ICD-10-CM | POA: Diagnosis not present

## 2018-01-19 DIAGNOSIS — I110 Hypertensive heart disease with heart failure: Secondary | ICD-10-CM | POA: Diagnosis not present

## 2018-01-19 DIAGNOSIS — M3219 Other organ or system involvement in systemic lupus erythematosus: Secondary | ICD-10-CM | POA: Diagnosis not present

## 2018-01-19 DIAGNOSIS — I5033 Acute on chronic diastolic (congestive) heart failure: Secondary | ICD-10-CM | POA: Diagnosis not present

## 2018-01-26 DIAGNOSIS — R809 Proteinuria, unspecified: Secondary | ICD-10-CM | POA: Diagnosis not present

## 2018-01-26 DIAGNOSIS — M3214 Glomerular disease in systemic lupus erythematosus: Secondary | ICD-10-CM | POA: Diagnosis not present

## 2018-01-26 DIAGNOSIS — R319 Hematuria, unspecified: Secondary | ICD-10-CM | POA: Diagnosis not present

## 2018-01-27 ENCOUNTER — Telehealth: Payer: Self-pay | Admitting: Family Medicine

## 2018-01-27 ENCOUNTER — Ambulatory Visit (INDEPENDENT_AMBULATORY_CARE_PROVIDER_SITE_OTHER): Payer: 59 | Admitting: Family Medicine

## 2018-01-27 ENCOUNTER — Encounter: Payer: Self-pay | Admitting: Family Medicine

## 2018-01-27 VITALS — BP 128/74 | HR 96 | Temp 98.8°F | Ht 59.0 in | Wt 147.5 lb

## 2018-01-27 DIAGNOSIS — I5032 Chronic diastolic (congestive) heart failure: Secondary | ICD-10-CM

## 2018-01-27 DIAGNOSIS — L930 Discoid lupus erythematosus: Secondary | ICD-10-CM

## 2018-01-27 DIAGNOSIS — M3214 Glomerular disease in systemic lupus erythematosus: Secondary | ICD-10-CM | POA: Diagnosis not present

## 2018-01-27 DIAGNOSIS — R600 Localized edema: Secondary | ICD-10-CM

## 2018-01-27 LAB — CBC WITH DIFFERENTIAL/PLATELET
BASOS PCT: 1.3 % (ref 0.0–3.0)
Basophils Absolute: 0 10*3/uL (ref 0.0–0.1)
EOS PCT: 0.4 % (ref 0.0–5.0)
Eosinophils Absolute: 0 10*3/uL (ref 0.0–0.7)
HCT: 24.3 % — ABNORMAL LOW (ref 36.0–46.0)
Hemoglobin: 8.2 g/dL — ABNORMAL LOW (ref 12.0–15.0)
LYMPHS ABS: 1.4 10*3/uL (ref 0.7–4.0)
Lymphocytes Relative: 36.8 % (ref 12.0–46.0)
MCHC: 33.7 g/dL (ref 30.0–36.0)
MCV: 90.1 fl (ref 78.0–100.0)
MONO ABS: 0.3 10*3/uL (ref 0.1–1.0)
Monocytes Relative: 8 % (ref 3.0–12.0)
NEUTROS PCT: 53.5 % (ref 43.0–77.0)
Neutro Abs: 2.1 10*3/uL (ref 1.4–7.7)
Platelets: 259 10*3/uL (ref 150.0–400.0)
RBC: 2.7 Mil/uL — ABNORMAL LOW (ref 3.87–5.11)
RDW: 16.1 % — AB (ref 11.5–15.5)
WBC: 3.8 10*3/uL — ABNORMAL LOW (ref 4.0–10.5)

## 2018-01-27 LAB — COMPREHENSIVE METABOLIC PANEL
ALK PHOS: 96 U/L (ref 39–117)
ALT: 15 U/L (ref 0–35)
AST: 19 U/L (ref 0–37)
Albumin: 2.4 g/dL — ABNORMAL LOW (ref 3.5–5.2)
BUN: 37 mg/dL — AB (ref 6–23)
CO2: 26 mEq/L (ref 19–32)
Calcium: 8.2 mg/dL — ABNORMAL LOW (ref 8.4–10.5)
Chloride: 107 mEq/L (ref 96–112)
Creatinine, Ser: 1.25 mg/dL — ABNORMAL HIGH (ref 0.40–1.20)
GFR: 57.6 mL/min — ABNORMAL LOW (ref >60.00)
GLUCOSE: 74 mg/dL (ref 70–99)
POTASSIUM: 3.9 meq/L (ref 3.5–5.1)
SODIUM: 139 meq/L (ref 135–145)
TOTAL PROTEIN: 5.7 g/dL — AB (ref 6.0–8.3)
Total Bilirubin: 0.3 mg/dL (ref 0.2–1.2)

## 2018-01-27 LAB — PHOSPHORUS: PHOSPHORUS: 4.4 mg/dL (ref 2.3–4.6)

## 2018-01-27 MED ORDER — LOSARTAN POTASSIUM 50 MG PO TABS: 50.0000 mg | ORAL_TABLET | Freq: Every day | ORAL | 0 refills | Status: DC

## 2018-01-27 MED ORDER — PREDNISONE 10 MG PO TABS: 10.0000 mg | ORAL_TABLET | Freq: Every day | ORAL | 0 refills | Status: DC

## 2018-01-27 NOTE — Assessment & Plan Note (Signed)
Slow improvement on lasix 80mg  daily.

## 2018-01-27 NOTE — Assessment & Plan Note (Deleted)
Has recovered from this. Now on lasix 20mg  daily. Has recently seen cardiology.

## 2018-01-27 NOTE — Telephone Encounter (Signed)
Letter written and in chart.  Would have patient call us first of June with update on how she's doing, and discuss return to work plan at that time.

## 2018-01-27 NOTE — Progress Notes (Signed)
BP 128/74 (BP Location: Right Arm, Patient Position: Sitting, Cuff Size: Normal)   Pulse 96   Temp 98.8 F (37.1 C) (Oral)   Ht 4\' 11"  (1.499 m)   Wt 147 lb 8 oz (66.9 kg)   SpO2 100%   BMI 29.79 kg/m    CC: 6 wk f/u visit Subjective:    Patient ID: Melanie Holloway, female    DOB: 11-15-1965, 52 y.o.   MRN: [id number]  HPI: Melanie Holloway is a 52 y.o. female presenting on 01/27/2018 for 6 wk follow-up (Has lab order from nephrologist.)   Established with rheum Dr Jefm Bryant. Records reviewed. Recent new lupus diagnosis s/p hospitalization. Saw Dr Juleen China - requesting labwork today which will be ordered.   Leg swelling is starting to decrease. Dyspnea is stable. Walking with walker.  Saw renal, may increase mycophenolate dose to help with swelling.   Worked with HHPT - has HEP. Increasing stamina by walking in the house. Uneven terrain around the house makes outdoor exercise difficult.   Today is last dose of prednisone 10mg  daily. Unclear if she needs to continue this.   Relevant past medical, surgical, family and social history reviewed and updated as indicated. Interim medical history since our last visit reviewed. Allergies and medications reviewed and updated. Outpatient Medications Prior to Visit  Medication Sig Dispense Refill  . acetaminophen (TYLENOL) 325 MG tablet Take 2 tablets (650 mg total) by mouth every 4 (four) hours as needed for headache or mild pain. 30 tablet 0  . Cholecalciferol (VITAMIN D) 2000 UNITS CAPS Take 1 capsule (2,000 Units total) by mouth daily. 30 capsule   . furosemide (LASIX) 40 MG tablet Take 2 tablets (80 mg total) by mouth daily. 60 tablet 0  . hydroxychloroquine (PLAQUENIL) 200 MG tablet Take 1 tablet (200 mg total) by mouth daily. 60 tablet 2  . letrozole (FEMARA) 2.5 MG tablet TAKE 1 TABLET BY MOUTH EVERY DAY 90 tablet 1  . Magnesium 400 MG CAPS Take 400 mg by mouth daily. 30 capsule 0  . metoprolol succinate (TOPROL XL) 25 MG 24 hr tablet Take  1 tablet (25 mg total) by mouth daily. 30 tablet 5  . Multiple Vitamin (MULTIVITAMIN WITH MINERALS) TABS tablet Take 1 tablet by mouth daily. 30 tablet 0  . mycophenolate (CELLCEPT) 250 MG capsule Take 2 capsules (500 mg total) by mouth 2 (two) times daily. 60 capsule 0  . protein supplement shake (PREMIER PROTEIN) LIQD Take 325 mLs (11 oz total) by mouth 2 (two) times daily between meals. 60 Can 0  . sulfamethoxazole-trimethoprim (BACTRIM DS,SEPTRA DS) 800-160 MG tablet Take 1 tablet by mouth 3 (three) times a week. 12 tablet 0  . triamcinolone cream (KENALOG) 0.1 % Apply 1 application topically 2 (two) times daily. Apply to AA. 453.6 g 0  . losartan (COZAAR) 50 MG tablet Take 1 tablet (50 mg total) by mouth daily. 90 tablet 0  . predniSONE (DELTASONE) 10 MG tablet Take 1 tablet (10 mg total) by mouth daily. 30 tablet 0   No facility-administered medications prior to visit.      Per HPI unless specifically indicated in ROS section below Review of Systems     Objective:    BP 128/74 (BP Location: Right Arm, Patient Position: Sitting, Cuff Size: Normal)   Pulse 96   Temp 98.8 F (37.1 C) (Oral)   Ht 4\' 11"  (1.499 m)   Wt 147 lb 8 oz (66.9 kg)   SpO2 100%   BMI 29.79  kg/m   Wt Readings from Last 3 Encounters:  01/27/18 147 lb 8 oz (66.9 kg)  01/10/18 145 lb 8 oz (66 kg)  12/30/17 140 lb 2 oz (63.6 kg)    Physical Exam  Constitutional: She appears well-developed and well-nourished. No distress.  HENT:  Mouth/Throat: Oropharynx is clear and moist. No oropharyngeal exudate.  Eyes: Pupils are equal, round, and reactive to light. Conjunctivae and EOM are normal. No scleral icterus.  Cardiovascular: Normal rate, regular rhythm, normal heart sounds and intact distal pulses.  No murmur heard. Pulmonary/Chest: Effort normal and breath sounds normal. No respiratory distress. She has no wheezes. She has no rales.  Musculoskeletal: She exhibits edema (1+ pitting).  Neurological:  5/5  strength BLE Struggles with getting on exam table Slowed gait with walker  Psychiatric: She has a normal mood and affect.  Nursing note and vitals reviewed.  Results for orders placed or performed in visit on 01/27/18  Comprehensive metabolic panel  Result Value Ref Range   Sodium 139 135 - 145 mEq/L   Potassium 3.9 3.5 - 5.1 mEq/L   Chloride 107 96 - 112 mEq/L   CO2 26 19 - 32 mEq/L   Glucose, Bld 74 70 - 99 mg/dL   BUN 37 (H) 6 - 23 mg/dL   Creatinine, Ser 1.25 (H) 0.40 - 1.20 mg/dL   Total Bilirubin 0.3 0.2 - 1.2 mg/dL   Alkaline Phosphatase 96 39 - 117 U/L   AST 19 0 - 37 U/L   ALT 15 0 - 35 U/L   Total Protein 5.7 (L) 6.0 - 8.3 g/dL   Albumin 2.4 (L) 3.5 - 5.2 g/dL   Calcium 8.2 (L) 8.4 - 10.5 mg/dL   GFR 57.60 (L) >60.00 mL/min  CBC with Differential/Platelet  Result Value Ref Range   WBC 3.8 (L) 4.0 - 10.5 K/uL   RBC 2.70 Repeated and verified X2. (L) 3.87 - 5.11 Mil/uL   Hemoglobin 8.2 Repeated and verified X2. (L) 12.0 - 15.0 g/dL   HCT 24.3 Repeated and verified X2. (L) 36.0 - 46.0 %   MCV 90.1 78.0 - 100.0 fl   MCHC 33.7 30.0 - 36.0 g/dL   RDW 16.1 (H) 11.5 - 15.5 %   Platelets 259.0 150.0 - 400.0 K/uL   Neutrophils Relative % 53.5 43.0 - 77.0 %   Lymphocytes Relative 36.8 12.0 - 46.0 %   Monocytes Relative 8.0 3.0 - 12.0 %   Eosinophils Relative 0.4 0.0 - 5.0 %   Basophils Relative 1.3 0.0 - 3.0 %   Neutro Abs 2.1 1.4 - 7.7 K/uL   Lymphs Abs 1.4 0.7 - 4.0 K/uL   Monocytes Absolute 0.3 0.1 - 1.0 K/uL   Eosinophils Absolute 0.0 0.0 - 0.7 K/uL   Basophils Absolute 0.0 0.0 - 0.1 K/uL  Phosphorus  Result Value Ref Range   Phosphorus 4.4 2.3 - 4.6 mg/dL      Assessment & Plan:   Problem List Items Addressed This Visit    Chronic diastolic heart failure (HCC) (Chronic)    Now on lasix 80mg  daily. Continue.       Relevant Medications   losartan (COZAAR) 50 MG tablet   Lupus erythematosus tumidus   Lupus nephritis, ISN/RPS class V ([place]) - Primary     Appreciate renal care - labs checked today and will forwad results.       Relevant Orders   Comprehensive metabolic panel (Completed)   CBC with Differential/Platelet (Completed)   Phosphorus (Completed)  Pedal edema    Slow improvement on lasix 80mg  daily.       Systemic lupus erythematosus ([place])    Appreciate care of all involved. Pt is slowly feeling better but not yet ready to return to work. Pt requests letter for work which will be provided.  Unclear plan for prednisone - she states today is last dose of steroid. I believe plan was to continue 10mg  dose - will refill x1 month while she returns to see renal. Will defer further prednisone plan to renal.  Continue cellcept 500mg  bid and plaquenil 200mg  daily.           Meds ordered this encounter  Medications  . losartan (COZAAR) 50 MG tablet    Sig: Take 1 tablet (50 mg total) by mouth daily.    Dispense:  90 tablet    Refill:  0  . predniSONE (DELTASONE) 10 MG tablet    Sig: Take 1 tablet (10 mg total) by mouth daily.    Dispense:  30 tablet    Refill:  0   Orders Placed This Encounter  Procedures  . Comprehensive metabolic panel  . CBC with Differential/Platelet  . Phosphorus    Follow up plan: Return in about 4 months (around 05/29/2018) for annual exam, prior fasting for blood work.  Ria Bush, MD

## 2018-01-27 NOTE — Assessment & Plan Note (Signed)
Appreciate renal care - labs checked today and will forwad results.

## 2018-01-27 NOTE — Assessment & Plan Note (Addendum)
Appreciate care of all involved. Pt is slowly feeling better but not yet ready to return to work. Pt requests letter for work which will be provided.  Unclear plan for prednisone - she states today is last dose of steroid. I believe plan was to continue 10mg  dose - will refill x1 month while she returns to see renal. Will defer further prednisone plan to renal.  Continue cellcept 500mg  bid and plaquenil 200mg  daily.

## 2018-01-27 NOTE — Patient Instructions (Addendum)
Continue prednisone 10mg  daily unless you hear otherwise from Dr Juleen China.  Labs today. Continue current medicines. Losartan refilled. I'm glad we're doing better.  Work on walking and conditioning over the next month. Return as needed or in 3-4 months for physical.

## 2018-01-27 NOTE — Telephone Encounter (Signed)
Best number [phone]  Pt stated you mention something about her going back to work in June She schedule her cpx in august. She wanted to know if she needs to come back before going back to work Please send note to her job [employer]

## 2018-01-27 NOTE — Assessment & Plan Note (Signed)
Now on lasix 80mg  daily. Continue.

## 2018-01-28 NOTE — Telephone Encounter (Signed)
Spoke with pt relaying Dr. Synthia Innocent message.  Notified pt letter was faxed to her job. Verbalizes understanding and expresses her thanks.

## 2018-02-01 NOTE — Telephone Encounter (Signed)
Pt did not receive letter. She is requesting it to be refaxed to [phone].

## 2018-02-01 NOTE — Telephone Encounter (Signed)
Refaxed letter to [facility] at [phone].

## 2018-02-01 NOTE — Telephone Encounter (Signed)
Update* Correct fax# [phone]

## 2018-02-02 ENCOUNTER — Other Ambulatory Visit: Payer: Self-pay | Admitting: Family Medicine

## 2018-02-02 ENCOUNTER — Telehealth: Payer: Self-pay | Admitting: Family Medicine

## 2018-02-02 NOTE — Telephone Encounter (Signed)
Copied from [place] [id number]. Topic: Quick Communication - See Telephone Encounter >> Feb 02, 2018  1:05 PM Hewitt Shorts wrote: CRM for notification. See Telephone encounter for: 02/02/18. Wells Guiles from pt work is calling to say that the out of work note pt turned in needs to be more cleaer as to estimated return  date and here FMLA expires on the 22nd    Best number is [phone]

## 2018-02-07 NOTE — Telephone Encounter (Signed)
New letter written and in Lisa's box.  plz notify Wells Guiles from her work that [employer] extended out of work date through 03/19/2018.  plz notify patient to let us know if she wants to try return part time sooner.

## 2018-02-07 NOTE — Telephone Encounter (Signed)
Faxed letter to Melanie Holloway at [facility] [employer].  Informed her pt has been out-of-work-date was extended through 03/19/18.    Spoke with pt relaying Dr. Synthia Innocent message.  Pt verbalizes understanding and expresses her thanks.

## 2018-02-09 ENCOUNTER — Inpatient Hospital Stay (HOSPITAL_COMMUNITY)
Admit: 2018-02-09 | Discharge: 2018-02-09 | Disposition: A | Discharge status: Home / Self Care | Payer: 59 | Attending: Internal Medicine | Admitting: Internal Medicine

## 2018-02-09 ENCOUNTER — Encounter: Payer: Self-pay | Admitting: Emergency Medicine

## 2018-02-09 ENCOUNTER — Ambulatory Visit: Payer: Self-pay | Admitting: *Deleted

## 2018-02-09 ENCOUNTER — Other Ambulatory Visit: Payer: Self-pay

## 2018-02-09 ENCOUNTER — Emergency Department: Payer: 59

## 2018-02-09 ENCOUNTER — Ambulatory Visit (INDEPENDENT_AMBULATORY_CARE_PROVIDER_SITE_OTHER)
Admission: EM | Admit: 2018-02-09 | Discharge: 2018-02-09 | Disposition: A | Discharge status: Nursing Home (Includes ICF) | Payer: 59 | Source: Home / Self Care

## 2018-02-09 ENCOUNTER — Encounter: Payer: Self-pay | Admitting: Internal Medicine

## 2018-02-09 ENCOUNTER — Inpatient Hospital Stay
Admission: EM | Admit: 2018-02-09 | Discharge: 2018-02-15 | DRG: 291 | Disposition: A | Discharge status: Home Health | Payer: 59 | Attending: Internal Medicine | Admitting: Internal Medicine

## 2018-02-09 DIAGNOSIS — I5043 Acute on chronic combined systolic (congestive) and diastolic (congestive) heart failure: Secondary | ICD-10-CM | POA: Diagnosis not present

## 2018-02-09 DIAGNOSIS — E875 Hyperkalemia: Secondary | ICD-10-CM | POA: Diagnosis present

## 2018-02-09 DIAGNOSIS — Z7952 Long term (current) use of systemic steroids: Secondary | ICD-10-CM | POA: Diagnosis not present

## 2018-02-09 DIAGNOSIS — J9621 Acute and chronic respiratory failure with hypoxia: Secondary | ICD-10-CM | POA: Diagnosis not present

## 2018-02-09 DIAGNOSIS — M3214 Glomerular disease in systemic lupus erythematosus: Secondary | ICD-10-CM | POA: Diagnosis not present

## 2018-02-09 DIAGNOSIS — J9811 Atelectasis: Secondary | ICD-10-CM | POA: Diagnosis present

## 2018-02-09 DIAGNOSIS — Z79811 Long term (current) use of aromatase inhibitors: Secondary | ICD-10-CM | POA: Diagnosis not present

## 2018-02-09 DIAGNOSIS — N179 Acute kidney failure, unspecified: Secondary | ICD-10-CM | POA: Diagnosis present

## 2018-02-09 DIAGNOSIS — I080 Rheumatic disorders of both mitral and aortic valves: Secondary | ICD-10-CM | POA: Diagnosis present

## 2018-02-09 DIAGNOSIS — I420 Dilated cardiomyopathy: Secondary | ICD-10-CM | POA: Diagnosis not present

## 2018-02-09 DIAGNOSIS — J90 Pleural effusion, not elsewhere classified: Secondary | ICD-10-CM | POA: Diagnosis not present

## 2018-02-09 DIAGNOSIS — J9601 Acute respiratory failure with hypoxia: Secondary | ICD-10-CM | POA: Diagnosis not present

## 2018-02-09 DIAGNOSIS — J810 Acute pulmonary edema: Secondary | ICD-10-CM | POA: Diagnosis not present

## 2018-02-09 DIAGNOSIS — I5031 Acute diastolic (congestive) heart failure: Secondary | ICD-10-CM | POA: Diagnosis not present

## 2018-02-09 DIAGNOSIS — I428 Other cardiomyopathies: Secondary | ICD-10-CM | POA: Diagnosis not present

## 2018-02-09 DIAGNOSIS — I110 Hypertensive heart disease with heart failure: Principal | ICD-10-CM | POA: Diagnosis present

## 2018-02-09 DIAGNOSIS — Z923 Personal history of irradiation: Secondary | ICD-10-CM | POA: Diagnosis not present

## 2018-02-09 DIAGNOSIS — Z9012 Acquired absence of left breast and nipple: Secondary | ICD-10-CM

## 2018-02-09 DIAGNOSIS — E669 Obesity, unspecified: Secondary | ICD-10-CM | POA: Diagnosis present

## 2018-02-09 DIAGNOSIS — D561 Beta thalassemia: Secondary | ICD-10-CM | POA: Diagnosis present

## 2018-02-09 DIAGNOSIS — Z6830 Body mass index (BMI) 30.0-30.9, adult: Secondary | ICD-10-CM

## 2018-02-09 DIAGNOSIS — R0602 Shortness of breath: Secondary | ICD-10-CM | POA: Diagnosis not present

## 2018-02-09 DIAGNOSIS — I5021 Acute systolic (congestive) heart failure: Secondary | ICD-10-CM | POA: Diagnosis not present

## 2018-02-09 DIAGNOSIS — M329 Systemic lupus erythematosus, unspecified: Secondary | ICD-10-CM | POA: Diagnosis not present

## 2018-02-09 DIAGNOSIS — Z79899 Other long term (current) drug therapy: Secondary | ICD-10-CM

## 2018-02-09 DIAGNOSIS — I5041 Acute combined systolic (congestive) and diastolic (congestive) heart failure: Secondary | ICD-10-CM | POA: Diagnosis not present

## 2018-02-09 DIAGNOSIS — J9600 Acute respiratory failure, unspecified whether with hypoxia or hypercapnia: Secondary | ICD-10-CM | POA: Diagnosis not present

## 2018-02-09 DIAGNOSIS — I5023 Acute on chronic systolic (congestive) heart failure: Secondary | ICD-10-CM | POA: Diagnosis not present

## 2018-02-09 DIAGNOSIS — I509 Heart failure, unspecified: Secondary | ICD-10-CM | POA: Diagnosis not present

## 2018-02-09 DIAGNOSIS — I129 Hypertensive chronic kidney disease with stage 1 through stage 4 chronic kidney disease, or unspecified chronic kidney disease: Secondary | ICD-10-CM | POA: Diagnosis not present

## 2018-02-09 DIAGNOSIS — J189 Pneumonia, unspecified organism: Secondary | ICD-10-CM | POA: Diagnosis present

## 2018-02-09 DIAGNOSIS — Z853 Personal history of malignant neoplasm of breast: Secondary | ICD-10-CM | POA: Diagnosis not present

## 2018-02-09 DIAGNOSIS — I313 Pericardial effusion (noninflammatory): Secondary | ICD-10-CM | POA: Diagnosis not present

## 2018-02-09 DIAGNOSIS — I248 Other forms of acute ischemic heart disease: Secondary | ICD-10-CM | POA: Diagnosis present

## 2018-02-09 DIAGNOSIS — I10 Essential (primary) hypertension: Secondary | ICD-10-CM | POA: Diagnosis not present

## 2018-02-09 DIAGNOSIS — Z9221 Personal history of antineoplastic chemotherapy: Secondary | ICD-10-CM | POA: Diagnosis not present

## 2018-02-09 DIAGNOSIS — J9690 Respiratory failure, unspecified, unspecified whether with hypoxia or hypercapnia: Secondary | ICD-10-CM | POA: Diagnosis not present

## 2018-02-09 DIAGNOSIS — I38 Endocarditis, valve unspecified: Secondary | ICD-10-CM | POA: Diagnosis not present

## 2018-02-09 DIAGNOSIS — I501 Left ventricular failure: Secondary | ICD-10-CM | POA: Diagnosis not present

## 2018-02-09 LAB — COMPREHENSIVE METABOLIC PANEL
ALBUMIN: 2.7 g/dL — AB (ref 3.5–5.0)
ALT: 23 U/L (ref 14–54)
AST: 35 U/L (ref 15–41)
Alkaline Phosphatase: 86 U/L (ref 38–126)
Anion gap: 6 (ref 5–15)
BUN: 37 mg/dL — AB (ref 6–20)
CHLORIDE: 107 mmol/L (ref 101–111)
CO2: 25 mmol/L (ref 22–32)
Calcium: 8.3 mg/dL — ABNORMAL LOW (ref 8.9–10.3)
Creatinine, Ser: 1 mg/dL (ref 0.44–1.00)
GFR calc Af Amer: >60 mL/min (ref >60)
GFR calc non Af Amer: >60 mL/min (ref >60)
GLUCOSE: 112 mg/dL — AB (ref 65–99)
POTASSIUM: 3.6 mmol/L (ref 3.5–5.1)
SODIUM: 138 mmol/L (ref 135–145)
Total Bilirubin: 0.4 mg/dL (ref 0.3–1.2)
Total Protein: 6.3 g/dL — ABNORMAL LOW (ref 6.5–8.1)

## 2018-02-09 LAB — CBC WITH DIFFERENTIAL/PLATELET
BASOS ABS: 0.1 10*3/uL (ref 0–0.1)
Basophils Relative: 1 %
EOS PCT: 0 %
Eosinophils Absolute: 0 10*3/uL (ref 0–0.7)
HEMATOCRIT: 25.8 % — AB (ref 35.0–47.0)
Hemoglobin: 8.6 g/dL — ABNORMAL LOW (ref 12.0–16.0)
Lymphocytes Relative: 14 %
Lymphs Abs: 0.9 10*3/uL — ABNORMAL LOW (ref 1.0–3.6)
MCH: 29.9 pg (ref 26.0–34.0)
MCHC: 33.4 g/dL (ref 32.0–36.0)
MCV: 89.7 fL (ref 80.0–100.0)
MONO ABS: 0.5 10*3/uL (ref 0.2–0.9)
Monocytes Relative: 8 %
NEUTROS ABS: 4.9 10*3/uL (ref 1.4–6.5)
Neutrophils Relative %: 77 %
PLATELETS: 303 10*3/uL (ref 150–440)
RBC: 2.88 MIL/uL — ABNORMAL LOW (ref 3.80–5.20)
RDW: 16.9 % — AB (ref 11.5–14.5)
WBC: 6.3 10*3/uL (ref 3.6–11.0)

## 2018-02-09 LAB — TROPONIN I
Troponin I: 0.05 ng/mL (ref <0.03)
Troponin I: 0.1 ng/mL (ref <0.03)

## 2018-02-09 LAB — BRAIN NATRIURETIC PEPTIDE: B Natriuretic Peptide: 1940 pg/mL — ABNORMAL HIGH (ref 0.0–100.0)

## 2018-02-09 MED ORDER — LETROZOLE 2.5 MG PO TABS
2.5000 mg | ORAL_TABLET | Freq: Every day | ORAL | Status: DC
  Administered 2018-02-11 – 2018-02-15 (×5): 2.5 mg via ORAL
  Filled 2018-02-09 (×7): qty 1

## 2018-02-09 MED ORDER — PREMIER PROTEIN SHAKE
11.0000 [oz_av] | Freq: Two times a day (BID) | ORAL | Status: DC
Start: 2018-02-10 — End: 2018-02-14

## 2018-02-09 MED ORDER — MYCOPHENOLATE MOFETIL 250 MG PO CAPS
500.0000 mg | ORAL_CAPSULE | Freq: Two times a day (BID) | ORAL | Status: DC
  Administered 2018-02-09 – 2018-02-15 (×11): 500 mg via ORAL
  Filled 2018-02-09 (×14): qty 2

## 2018-02-09 MED ORDER — LOSARTAN POTASSIUM 50 MG PO TABS
50.0000 mg | ORAL_TABLET | Freq: Every day | ORAL | Status: DC
  Administered 2018-02-10: 50 mg via ORAL
  Filled 2018-02-09: qty 1

## 2018-02-09 MED ORDER — SODIUM CHLORIDE 0.9% FLUSH
3.0000 mL | Freq: Two times a day (BID) | INTRAVENOUS | Status: DC
  Administered 2018-02-09 – 2018-02-10 (×3): 3 mL via INTRAVENOUS

## 2018-02-09 MED ORDER — MAGNESIUM OXIDE 400 (241.3 MG) MG PO TABS
400.0000 mg | ORAL_TABLET | Freq: Every day | ORAL | Status: DC
  Administered 2018-02-11 – 2018-02-15 (×5): 400 mg via ORAL
  Filled 2018-02-09 (×5): qty 1

## 2018-02-09 MED ORDER — PREDNISONE 10 MG PO TABS: 10.0000 mg | ORAL_TABLET | Freq: Every day | ORAL | Status: DC

## 2018-02-09 MED ORDER — IPRATROPIUM-ALBUTEROL 0.5-2.5 (3) MG/3ML IN SOLN
3.0000 mL | Freq: Four times a day (QID) | RESPIRATORY_TRACT | Status: DC | PRN
  Administered 2018-02-09: 3 mL via RESPIRATORY_TRACT
  Filled 2018-02-09 (×2): qty 3

## 2018-02-09 MED ORDER — HYDROXYCHLOROQUINE SULFATE 200 MG PO TABS
200.0000 mg | ORAL_TABLET | Freq: Every day | ORAL | Status: DC
  Administered 2018-02-11 – 2018-02-15 (×5): 200 mg via ORAL
  Filled 2018-02-09 (×7): qty 1

## 2018-02-09 MED ORDER — VITAMIN D 1000 UNITS PO TABS
2000.0000 [IU] | ORAL_TABLET | Freq: Every day | ORAL | Status: DC
Start: 2018-02-09 — End: 2018-02-15
  Administered 2018-02-11 – 2018-02-15 (×5): 2000 [IU] via ORAL
  Filled 2018-02-09 (×5): qty 2

## 2018-02-09 MED ORDER — ACETAMINOPHEN 325 MG PO TABS: 650.0000 mg | ORAL_TABLET | ORAL | Status: DC | PRN

## 2018-02-09 MED ORDER — FUROSEMIDE 10 MG/ML IJ SOLN
60.0000 mg | Freq: Two times a day (BID) | INTRAMUSCULAR | Status: DC
  Administered 2018-02-09 – 2018-02-10 (×2): 60 mg via INTRAVENOUS
  Filled 2018-02-09 (×2): qty 6

## 2018-02-09 MED ORDER — SODIUM CHLORIDE 0.9% FLUSH: 3.0000 mL | INTRAVENOUS | Status: DC | PRN

## 2018-02-09 MED ORDER — ENOXAPARIN SODIUM 40 MG/0.4ML ~~LOC~~ SOLN
40.0000 mg | SUBCUTANEOUS | Status: DC
  Administered 2018-02-09 – 2018-02-10 (×2): 40 mg via SUBCUTANEOUS
  Filled 2018-02-09 (×2): qty 0.4

## 2018-02-09 MED ORDER — FUROSEMIDE 10 MG/ML IJ SOLN
40.0000 mg | Freq: Once | INTRAMUSCULAR | Status: AC
  Administered 2018-02-09: 40 mg via INTRAVENOUS
  Filled 2018-02-09: qty 4

## 2018-02-09 MED ORDER — SODIUM CHLORIDE 0.9 % IV SOLN: 250.0000 mL | INTRAVENOUS | Status: DC | PRN

## 2018-02-09 MED ORDER — TRIAMCINOLONE ACETONIDE 0.1 % EX CREA
1.0000 "application " | TOPICAL_CREAM | Freq: Two times a day (BID) | CUTANEOUS | Status: DC | PRN
  Filled 2018-02-09: qty 15

## 2018-02-09 MED ORDER — ONDANSETRON HCL 4 MG/2ML IJ SOLN
4.0000 mg | Freq: Four times a day (QID) | INTRAMUSCULAR | Status: DC | PRN
  Administered 2018-02-10 – 2018-02-13 (×3): 4 mg via INTRAVENOUS
  Filled 2018-02-09 (×3): qty 2

## 2018-02-09 MED ORDER — ADULT MULTIVITAMIN W/MINERALS CH
1.0000 | ORAL_TABLET | Freq: Every day | ORAL | Status: DC
  Administered 2018-02-11 – 2018-02-15 (×5): 1 via ORAL
  Filled 2018-02-09 (×5): qty 1

## 2018-02-09 MED ORDER — ORAL CARE MOUTH RINSE
15.0000 mL | Freq: Two times a day (BID) | OROMUCOSAL | Status: DC
  Administered 2018-02-09 – 2018-02-15 (×7): 15 mL via OROMUCOSAL

## 2018-02-09 MED ORDER — ALBUTEROL SULFATE (2.5 MG/3ML) 0.083% IN NEBU
2.5000 mg | INHALATION_SOLUTION | RESPIRATORY_TRACT | Status: AC
  Administered 2018-02-09: 2.5 mg via RESPIRATORY_TRACT

## 2018-02-09 MED ORDER — METOPROLOL SUCCINATE ER 50 MG PO TB24
25.0000 mg | ORAL_TABLET | Freq: Every day | ORAL | Status: DC
  Administered 2018-02-10 – 2018-02-12 (×3): 25 mg via ORAL
  Filled 2018-02-09 (×3): qty 1

## 2018-02-09 NOTE — ED Triage Notes (Signed)
Patient in today c/o sob since 8am this morning. Patient has a history of COPD and CHF.

## 2018-02-09 NOTE — ED Notes (Signed)
Patient ambulated to the room commode and back with a steady gait. Patient has increased shortness of breath with ambulation.
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
## 2018-02-09 NOTE — ED Triage Notes (Signed)
Per EMS report, patient went to [facility] for c/o shortness of breath. Patient was given one duo neb treatment and lung sounds cleared and patient felt less short of breath. Patient had a blood pressure of 198/104 and 1.5in of Nitro paste was applied by EMS.

## 2018-02-09 NOTE — Telephone Encounter (Signed)
Noted. Thanks.

## 2018-02-09 NOTE — Telephone Encounter (Signed)
I spoke with pt and she is going to [facility] ED now; pt was short of breath talking on the phone. Pt did not want 911 called she said someone was there to take her to ED. FYI to Dr Darnell Level.
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
## 2018-02-09 NOTE — ED Notes (Signed)
Pink sleeve placed on left arm.

## 2018-02-09 NOTE — Telephone Encounter (Signed)
Pt stating she has been feeling short of breath since 8 am this morning. Pt states she initially felt the shortness of breath when she woke up this morning and it comes and goes. Pt denies any chest pain, wheezing or other symptoms at this time. Pt states she does here a crackling sound with breathing and currently has a non productive cough. Pt has a history of CHF and is currently on lasix and states she has good urine output. Pt thinks that she may have a virus and has experienced loose stools since this morning as well. Pt appt availabe with PCP or other providers in the office at this time. Pt advised to seed treatment in the ED or Urgent Care. Pt verbalized understanding.  Reason for Disposition . [1] MODERATE difficulty breathing (e.g., speaks in phrases, SOB even at rest, pulse 100-120) AND [2] NEW-onset or WORSE than normal  Answer Assessment - Initial Assessment Questions 1. RESPIRATORY STATUS: "Describe your breathing?" (e.g., wheezing, shortness of breath, unable to speak, severe coughing)      Shortness of breath, sounds like a cracking sound, cough-non productive 2. ONSET: "When did this breathing problem begin?"      About 8am this morning 3. PATTERN "Does the difficult breathing come and go, or has it been constant since it started?"      Comes an goes 4. SEVERITY: "How bad is your breathing?" (e.g., mild, moderate, severe)    - MILD: No SOB at rest, mild SOB with walking, speaks normally in sentences, can lay down, no retractions, pulse < 100.    - MODERATE: SOB at rest, SOB with minimal exertion and prefers to sit, cannot lie down flat, speaks in phrases, mild retractions, audible wheezing, pulse 100-120.    - SEVERE: Very SOB at rest, speaks in single words, struggling to breathe, sitting hunched forward, retractions, pulse > 120      moderate 5. RECURRENT SYMPTOM: "Have you had difficulty breathing before?" If so, ask: "When was the last time?" and "What happened that time?"   It's been a while and pt states that usually passes over 6. CARDIAC HISTORY: "Do you have any history of heart disease?" (e.g., heart attack, angina, bypass surgery, angioplasty)      CHF 7. LUNG HISTORY: "Do you have any history of lung disease?"  (e.g., pulmonary embolus, asthma, emphysema)     No 8. CAUSE: "What do you think is causing the breathing problem?"      Maybe a virus 9. OTHER SYMPTOMS: "Do you have any other symptoms? (e.g., dizziness, runny nose, cough, chest pain, fever)     Non productive cough 10. PREGNANCY: "Is there any chance you are pregnant?" "When was your last menstrual period?"       No pregnancy or cycles 11. TRAVEL: "Have you traveled out of the country in the last month?" (e.g., travel history, exposures)      No  Protocols used: BREATHING DIFFICULTY-A-AH
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
## 2018-02-09 NOTE — Progress Notes (Signed)
Advanced care plan.  Purpose of the Encounter: CODE STATUS  Parties in Attendance:Patient  Patient's Decision Capacity:Good  Subjective/Patient's story: Presented with difficulty breathing   Objective/Medical story Has decompensated heart failure   Goals of care determination:  Advanced directives discussed Patient wants cardiac resuscitation, intubation and ventilator if the need arises   CODE STATUS: Full code   Time spent discussing advanced care planning: 16 minutes

## 2018-02-09 NOTE — ED Notes (Signed)
Dr. Joni Fears is aware of troponin of 0.05.

## 2018-02-09 NOTE — Progress Notes (Signed)
Pt called this RN in room, stating having a hard time breathing respirations at 40 per minute o2 at 100% on 2L. MD paged, Dr. Duane Boston ordered dounebs q6hr PRN. Will give one now & continue to monitor.   Conley Simmonds, RN, BSN

## 2018-02-10 ENCOUNTER — Inpatient Hospital Stay: Payer: 59

## 2018-02-10 ENCOUNTER — Inpatient Hospital Stay: Payer: Self-pay

## 2018-02-10 DIAGNOSIS — J9601 Acute respiratory failure with hypoxia: Secondary | ICD-10-CM

## 2018-02-10 DIAGNOSIS — J810 Acute pulmonary edema: Secondary | ICD-10-CM

## 2018-02-10 DIAGNOSIS — I420 Dilated cardiomyopathy: Secondary | ICD-10-CM

## 2018-02-10 LAB — URINALYSIS, ROUTINE W REFLEX MICROSCOPIC
BILIRUBIN URINE: NEGATIVE
GLUCOSE, UA: NEGATIVE mg/dL
KETONES UR: NEGATIVE mg/dL
LEUKOCYTES UA: NEGATIVE
NITRITE: NEGATIVE
PROTEIN: 100 mg/dL — AB
Specific Gravity, Urine: 1.012 (ref 1.005–1.030)
pH: 5 (ref 5.0–8.0)

## 2018-02-10 LAB — BASIC METABOLIC PANEL
Anion gap: 11 (ref 5–15)
BUN: 56 mg/dL — AB (ref 6–20)
CALCIUM: 8.3 mg/dL — AB (ref 8.9–10.3)
CO2: 22 mmol/L (ref 22–32)
Chloride: 105 mmol/L (ref 101–111)
Creatinine, Ser: 1.89 mg/dL — ABNORMAL HIGH (ref 0.44–1.00)
GFR calc Af Amer: 34 mL/min — ABNORMAL LOW (ref >60)
GFR, EST NON AFRICAN AMERICAN: 29 mL/min — AB (ref >60)
Glucose, Bld: 160 mg/dL — ABNORMAL HIGH (ref 65–99)
Potassium: 5.2 mmol/L — ABNORMAL HIGH (ref 3.5–5.1)
Sodium: 138 mmol/L (ref 135–145)

## 2018-02-10 LAB — CBC WITH DIFFERENTIAL/PLATELET
BASOS ABS: 0 10*3/uL (ref 0–0.1)
Basophils Relative: 0 %
EOS PCT: 0 %
Eosinophils Absolute: 0 10*3/uL (ref 0–0.7)
HCT: 24 % — ABNORMAL LOW (ref 35.0–47.0)
Hemoglobin: 8 g/dL — ABNORMAL LOW (ref 12.0–16.0)
Lymphocytes Relative: 18 %
Lymphs Abs: 1.1 10*3/uL (ref 1.0–3.6)
MCH: 29.7 pg (ref 26.0–34.0)
MCHC: 33.3 g/dL (ref 32.0–36.0)
MCV: 89.1 fL (ref 80.0–100.0)
MONO ABS: 0.3 10*3/uL (ref 0.2–0.9)
Monocytes Relative: 5 %
Neutro Abs: 4.6 10*3/uL (ref 1.4–6.5)
Neutrophils Relative %: 77 %
PLATELETS: 286 10*3/uL (ref 150–440)
RBC: 2.69 MIL/uL — ABNORMAL LOW (ref 3.80–5.20)
RDW: 16.3 % — AB (ref 11.5–14.5)
WBC: 6.1 10*3/uL (ref 3.6–11.0)

## 2018-02-10 LAB — ECHOCARDIOGRAM COMPLETE
Height: 59 in
Weight: 2411.2 oz

## 2018-02-10 LAB — GLUCOSE, CAPILLARY: Glucose-Capillary: 85 mg/dL (ref 65–99)

## 2018-02-10 LAB — MRSA PCR SCREENING: MRSA by PCR: NEGATIVE

## 2018-02-10 LAB — PROTEIN / CREATININE RATIO, URINE
Creatinine, Urine: 97 mg/dL
Protein Creatinine Ratio: 1.45 mg/mg{Cre} — ABNORMAL HIGH (ref 0.00–0.15)
Total Protein, Urine: 141 mg/dL

## 2018-02-10 LAB — TROPONIN I: TROPONIN I: 0.33 ng/mL — AB (ref <0.03)

## 2018-02-10 LAB — BRAIN NATRIURETIC PEPTIDE

## 2018-02-10 MED ORDER — BUDESONIDE 0.25 MG/2ML IN SUSP
0.2500 mg | Freq: Two times a day (BID) | RESPIRATORY_TRACT | Status: DC
  Administered 2018-02-10: 0.25 mg via RESPIRATORY_TRACT
  Filled 2018-02-10: qty 2

## 2018-02-10 MED ORDER — SODIUM CHLORIDE 0.9 % IV SOLN
500.0000 mg | INTRAVENOUS | Status: DC
  Administered 2018-02-10: 500 mg via INTRAVENOUS
  Filled 2018-02-10: qty 500

## 2018-02-10 MED ORDER — MORPHINE SULFATE (PF) 2 MG/ML IV SOLN
INTRAVENOUS | Status: AC
  Administered 2018-02-10: 2 mg via INTRAVENOUS
  Filled 2018-02-10: qty 1

## 2018-02-10 MED ORDER — MORPHINE SULFATE (PF) 2 MG/ML IV SOLN
2.0000 mg | Freq: Once | INTRAVENOUS | Status: AC
  Administered 2018-02-10: 2 mg via INTRAVENOUS

## 2018-02-10 MED ORDER — IPRATROPIUM-ALBUTEROL 0.5-2.5 (3) MG/3ML IN SOLN: 3.0000 mL | Freq: Four times a day (QID) | RESPIRATORY_TRACT | Status: DC

## 2018-02-10 MED ORDER — POTASSIUM CHLORIDE CRYS ER 20 MEQ PO TBCR
40.0000 meq | EXTENDED_RELEASE_TABLET | Freq: Once | ORAL | Status: AC
  Administered 2018-02-10: 40 meq via ORAL
  Filled 2018-02-10: qty 2

## 2018-02-10 MED ORDER — DEXTROSE 5 % IV SOLN
500.0000 mg | Freq: Once | INTRAVENOUS | Status: DC
  Filled 2018-02-10: qty 500

## 2018-02-10 MED ORDER — LIDOCAINE HCL 2 % IJ SOLN
10.0000 mL | INTRAMUSCULAR | Status: AC
  Administered 2018-02-10: 200 mg via INTRADERMAL
  Filled 2018-02-10: qty 10

## 2018-02-10 MED ORDER — BUDESONIDE 0.5 MG/2ML IN SUSP
0.5000 mg | Freq: Two times a day (BID) | RESPIRATORY_TRACT | Status: DC
  Administered 2018-02-10 – 2018-02-15 (×10): 0.5 mg via RESPIRATORY_TRACT
  Filled 2018-02-10 (×10): qty 2

## 2018-02-10 MED ORDER — METOPROLOL TARTRATE 5 MG/5ML IV SOLN: 2.5000 mg | INTRAVENOUS | Status: DC | PRN

## 2018-02-10 MED ORDER — SODIUM CHLORIDE 0.9 % IV SOLN
500.0000 mg | INTRAVENOUS | Status: DC
  Filled 2018-02-10: qty 500

## 2018-02-10 MED ORDER — FUROSEMIDE 10 MG/ML IJ SOLN
80.0000 mg | Freq: Four times a day (QID) | INTRAMUSCULAR | Status: DC
  Administered 2018-02-10 – 2018-02-11 (×2): 80 mg via INTRAVENOUS
  Filled 2018-02-10 (×2): qty 8

## 2018-02-10 MED ORDER — MORPHINE SULFATE (PF) 2 MG/ML IV SOLN
2.0000 mg | INTRAVENOUS | Status: AC
  Administered 2018-02-10: 2 mg via INTRAVENOUS

## 2018-02-10 MED ORDER — SODIUM CHLORIDE 0.9% FLUSH
10.0000 mL | INTRAVENOUS | Status: DC | PRN
  Administered 2018-02-15: 40 mL
  Filled 2018-02-10: qty 40

## 2018-02-10 MED ORDER — SODIUM CHLORIDE 0.9 % IV SOLN
1.0000 g | INTRAVENOUS | Status: DC
  Filled 2018-02-10: qty 10

## 2018-02-10 MED ORDER — METOLAZONE 5 MG PO TABS
5.0000 mg | ORAL_TABLET | Freq: Once | ORAL | Status: DC
  Filled 2018-02-10: qty 1

## 2018-02-10 MED ORDER — SODIUM CHLORIDE 0.9 % IV SOLN
1.0000 g | INTRAVENOUS | Status: DC
  Administered 2018-02-10: 1 g via INTRAVENOUS
  Filled 2018-02-10: qty 10

## 2018-02-10 MED ORDER — METHYLPREDNISOLONE SODIUM SUCC 40 MG IJ SOLR
40.0000 mg | Freq: Two times a day (BID) | INTRAMUSCULAR | Status: DC
  Administered 2018-02-10 – 2018-02-15 (×11): 40 mg via INTRAVENOUS
  Filled 2018-02-10 (×11): qty 1

## 2018-02-10 MED ORDER — LEVALBUTEROL HCL 0.63 MG/3ML IN NEBU
0.6300 mg | INHALATION_SOLUTION | Freq: Four times a day (QID) | RESPIRATORY_TRACT | Status: DC
  Administered 2018-02-10 – 2018-02-13 (×15): 0.63 mg via RESPIRATORY_TRACT
  Filled 2018-02-10 (×15): qty 3

## 2018-02-10 MED ORDER — FUROSEMIDE 10 MG/ML IJ SOLN
60.0000 mg | Freq: Four times a day (QID) | INTRAMUSCULAR | Status: DC
  Administered 2018-02-10: 60 mg via INTRAVENOUS
  Filled 2018-02-10: qty 6

## 2018-02-10 MED ORDER — METOPROLOL TARTRATE 5 MG/5ML IV SOLN
5.0000 mg | INTRAVENOUS | Status: AC
  Administered 2018-02-10: 5 mg via INTRAVENOUS
  Filled 2018-02-10: qty 5

## 2018-02-10 NOTE — Progress Notes (Signed)
Pharmacy Antibiotic Note  Melanie Holloway is a 52 y.o. female admitted on 02/09/2018 with pneumonia.  Pharmacy has been consulted for ceftriaxone dosing.  Plan: Will start ceftriaxone 1g IV daily  Height: 4\' 11"  (149.9 cm) Weight: 150 lb 11.2 oz (68.4 kg) IBW/kg (Calculated) : 43.2  Temp (24hrs), Avg:98.3 F (36.8 C), Min:97.7 F (36.5 C), Max:98.6 F (37 C)  Recent Labs  Lab 02/09/18 1332  WBC 6.3  CREATININE 1.00    Estimated Creatinine Clearance: 54.7 mL/min (by C-G formula based on SCr of 1 mg/dL).    No Known Allergies  Thank you for allowing pharmacy to be a part of this patient's care.  Tobie Lords, PharmD, BCPS Clinical Pharmacist 02/10/2018

## 2018-02-10 NOTE — Consult Note (Addendum)
Name: Melanie Holloway MRN: [id number] DOB: [birth year]    ADMISSION DATE:  02/09/2018 CONSULTATION DATE: 02/10/2018  REFERRING MD : Dr. Jodell Cipro   CHIEF COMPLAINT: Shortness of Breath   BRIEF PATIENT DESCRIPTION:  52 yo female admitted to telemetry unit with acute on chronic hypoxic respiratory failure secondary to CHF exacerbation and multifocal pneumonia required transfer to stepdown unit on 04/25 due to worsening respiratory failure requiring Bipap  SIGNIFICANT EVENTS  04/24-Pt admitted to telemetry unit  04/25-Pt transferred to stepdown unit with worsening respiratory failure requiring Bipap   STUDIES:  Echo 04/24>>  HISTORY OF PRESENT ILLNESS:   This is a 52 yo female with a PMH of Vitamin D Deficiency, Obesity, Lupus Nephritis, HTN, CKD, CHF, Left Breast Cancer (dx 12/2007), and Anemia.  She presented to [facility] ER 04/24 with acute onset of shortness of breath.  She also endorsed chronic bilateral lower extremity edema although worse than normal.  She initially went to Urgent Care on 04/24 due to symptoms and received a nebulized treatment for wheezing, O2 sats 89% to 97%. Urgent Care NP recommended pt proceed to emergency room for additional treatment.  In the ER CXR revealed pulmonary edema and possible infiltrates, therefore she received 40 mg iv lasix.  She was subsequently admitted to the telemetry unit by hospitalist team for further workup and treatment.  On 04/25 pt developed worsening respiratory failure requiring transfer to stepdown unit for Bipap.   PAST MEDICAL HISTORY :   has a past medical history of Abnormal Pap smear (~2005), Anemia, Breast cancer, left ([place]) (12/2007), CHF (congestive heart failure) ([place]), Chronic kidney disease, Closed nondisplaced fracture of fifth metatarsal bone of right foot (08/07/2016), Full dentures, Hypertension, Lupus nephritis ([place]), Obesity, Proteinuria (11/28/2015), and Vitamin D deficiency.  has a past surgical history that includes Mastectomy  (2009); Ankle surgery (1987); Tubal ligation (2000); and Flexible bronchoscopy (N/A, 11/30/2017). Prior to Admission medications   Medication Sig Start Date End Date Taking? Authorizing Provider  Cholecalciferol (VITAMIN D) 2000 UNITS CAPS Take 1 capsule (2,000 Units total) by mouth daily. 09/29/12  Yes Ria Bush, MD  furosemide (LASIX) 40 MG tablet Take 2 tablets (80 mg total) by mouth daily. 01/12/18  Yes Hackney, Otila Kluver A, FNP  hydroxychloroquine (PLAQUENIL) 200 MG tablet Take 1 tablet (200 mg total) by mouth daily. 12/08/17 12/08/18 Yes Salary, Avel Peace, MD  letrozole ([place]) 2.5 MG tablet TAKE 1 TABLET BY MOUTH EVERY DAY 12/15/17  Yes [place]at, Virgie Dad, MD  losartan (COZAAR) 50 MG tablet TAKE 1 TABLET BY MOUTH EVERY DAY 02/02/18  Yes Ria Bush, MD  Magnesium 400 MG CAPS Take 400 mg by mouth daily. 01/12/18  Yes Darylene Price A, FNP  metoprolol succinate (TOPROL XL) 25 MG 24 hr tablet Take 1 tablet (25 mg total) by mouth daily. 01/12/18  Yes Alisa Graff, FNP  Multiple Vitamin (MULTIVITAMIN WITH MINERALS) TABS tablet Take 1 tablet by mouth daily. 11/23/17  Yes Gouru, Illene Silver, MD  mycophenolate (CELLCEPT) 250 MG capsule Take 2 capsules (500 mg total) by mouth 2 (two) times daily. 12/08/17  Yes Salary, Avel Peace, MD  predniSONE (DELTASONE) 10 MG tablet Take 1 tablet (10 mg total) by mouth daily. 01/27/18 01/27/19 Yes Ria Bush, MD  triamcinolone cream (KENALOG) 0.1 % Apply 1 application topically 2 (two) times daily. Apply to [place]. 03/29/17 03/29/18 Yes Ria Bush, MD  acetaminophen (TYLENOL) 325 MG tablet Take 2 tablets (650 mg total) by mouth every 4 (four) hours as needed for headache or mild  pain. 11/23/17   Gouru, Illene Silver, MD  protein supplement shake (PREMIER PROTEIN) LIQD Take 325 mLs (11 oz total) by mouth 2 (two) times daily between meals. Patient not taking: Reported on 02/09/2018 11/23/17   Nicholes Mango, MD   No Known Allergies  FAMILY HISTORY:  family history includes  Cancer in her cousin and paternal grandmother; Diabetes in her father. SOCIAL HISTORY:  reports that she has never smoked. She has never used smokeless tobacco. She reports that she does not drink alcohol or use drugs.  REVIEW OF SYSTEMS:   Unable to assess pt in severe respiratory distress   SUBJECTIVE:  Unable to assess pt in severe respiratory distress   VITAL SIGNS: Temp:  [97.7 F (36.5 C)-98.6 F (37 C)] 98.5 F (36.9 C) (04/25 0301) Pulse Rate:  [86-143] 143 (04/25 0317) Resp:  [22-40] 38 (04/24 2336) BP: (134-185)/(97-116) 185/116 (04/25 0301) SpO2:  [35 %-100 %] 90 % (04/25 0317) Weight:  [68 kg (150 lb)-68.4 kg (150 lb 11.2 oz)] 68.4 kg (150 lb 11.2 oz) (04/24 1844)  PHYSICAL EXAMINATION: General: acutely ill appearing female, in severe respiratory distress  Neuro: alert and oriented, follows commands  HEENT: supple, JVD present  Cardiovascular: sinus tach, no R/G Lungs: diffuse crackles throughout, labored in tripod position  Abdomen: +BS x4, obese, soft, non tender, non distended  Musculoskeletal: 3+ bilateral lower extremity pitting edema  Skin: intact no rashes or lesions   Recent Labs  Lab 02/09/18 1332  NA 138  K 3.6  CL 107  CO2 25  BUN 37*  CREATININE 1.00  GLUCOSE 112*   Recent Labs  Lab 02/09/18 1332  HGB 8.6*  HCT 25.8*  WBC 6.3  PLT 303   Dg Chest 2 View  Result Date: 02/09/2018 CLINICAL DATA:  Shortness of breath beginning this morning. EXAM: CHEST - 2 VIEW COMPARISON:  PA and lateral chest 12/07/2017.  CT chest 11/29/2016. FINDINGS: Enlargement of the cardiopericardial silhouette consistent with cardiomegaly and pericardial effusion as seen on recent chest CT persists. Bilateral airspace disease identified and is new since the most recent exam. There are small to moderate bilateral pleural effusions. The patient is status post left mastectomy and axillary dissection. IMPRESSION: Marked worsening in extensive bilateral airspace disease with  associated small to moderate bilateral pleural effusions is most worrisome for pulmonary edema. Coexistent pneumonia is possible. Enlarged cardiopericardial silhouette consistent with cardiomegaly and pericardial effusion as seen on recent chest CT. Electronically Signed   By: Inge Rise M.D.   On: 02/09/2018 14:35    ASSESSMENT / PLAN: Acute on chronic hypoxic respiratory failure secondary to CHF exacerbation and multifocal  pneumonia  Elevated troponin likely secondary to demand ischemia in setting of respiratory failure Anemia without obvious acute blood loss  Hx: Obesity, Lupus Nephritis, and HTN P: Prn Bipap for dyspnea and/or hypoxia  Scheduled and prn bronchodilator therapy IV steroids Repeat CXR in am IV lasix  Continuous telemetry monitoring  Trend troponin's Cardiology consulted appreciate input  Trend WBC and monitor fever curve Stat PCT Will start azithromycin and ceftriaxone Trend BMP  Replace electrolytes as indicated  Monitor UOP VTE px: subq lovenox Trend CBC Monitor for s/sx of bleeding and transfuse for hgb <7 HIGH RISK FOR INTUBATION   Marda Stalker, [place] Pager [phone] (please enter 7 digits) PCCM Consult Pager [phone] (please enter 7 digits)

## 2018-02-10 NOTE — Progress Notes (Signed)
Peripherally Inserted Central Catheter/Midline Placement  The IV Nurse has discussed with the patient and/or persons authorized to consent for the patient, the purpose of this procedure and the potential benefits and risks involved with this procedure.  The benefits include less needle sticks, lab draws from the catheter, and the patient may be discharged home with the catheter. Risks include, but not limited to, infection, bleeding, blood clot (thrombus formation), and puncture of an artery; nerve damage and irregular heartbeat and possibility to perform a PICC exchange if needed/ordered by physician.  Alternatives to this procedure were also discussed.  Bard Power PICC patient education guide, fact sheet on infection prevention and patient information card has been provided to patient /or left at bedside.    PICC/Midline Placement Documentation  PICC Triple Lumen 02/10/18 PICC Right Brachial 32 cm 0 cm (Active)  Indication for Insertion or Continuance of Line Limited venous access - need for IV therapy >5 days (PICC only) 02/10/2018 10:55 PM  Exposed Catheter (cm) 0 cm 02/10/2018 10:55 PM  Site Assessment Clean;Dry;Intact 02/10/2018 10:55 PM  Lumen #1 Status Blood return noted;Flushed;[place] locked 02/10/2018 10:55 PM  Lumen #2 Status Blood return noted;Flushed;Saline locked 02/10/2018 10:55 PM  Lumen #3 Status Blood return noted;Flushed;Saline locked 02/10/2018 10:55 PM  Dressing Type Transparent 02/10/2018 10:55 PM  Dressing Status Clean;Dry;Intact;Antimicrobial disc in place 02/10/2018 10:55 PM  Dressing Intervention New dressing 02/10/2018 10:55 PM  Dressing Change Due 02/17/18 02/10/2018 10:55 PM       Aldona Lento L 02/10/2018, 11:10 PM

## 2018-02-10 NOTE — Progress Notes (Signed)
Per Dr. Juleen China pt is okay to receive a PICC placement. This RN attempted to contact her husband and son for PICC consent but was unable to.

## 2018-02-10 NOTE — Care Management (Signed)
RNCM consult received for home health. ICU RN asked that I speak with patient's husband. I have left message for him to call this RNCM.  It is noted that patient's FMLA expires on the 22nd (?April).

## 2018-02-10 NOTE — Progress Notes (Signed)
[facility]  ROUNDING NOTE   Subjective:   [name] admitted to [facility] on 02/09/2018 for Acute pulmonary edema ([place]) [J81.0] SOB (shortness of breath) [R06.02] Acute on chronic congestive heart failure, unspecified heart failure type ([place]) [I50.9]  Patient placed on noninvasive ventilation. She has significant pulmonary edema and hypoxia.   Patient was last seen in clinic on 4/13 where she was continued on furosemide 20mg  bid. Blood pressure was at goal. No edema.   Objective:  Vital signs in last 24 hours:  Temp:  [97.7 F (36.5 C)-98.6 F (37 C)] 98.5 F (36.9 C) (04/25 0301) Pulse Rate:  [86-143] 114 (04/25 0700) Resp:  [22-44] 42 (04/25 0700) BP: (121-185)/(91-116) 128/99 (04/25 0700) SpO2:  [93 %-100 %] 98 % (04/25 0700) Weight:  [68 kg (150 lb)-68.5 kg (151 lb 0.2 oz)] 68.5 kg (151 lb 0.2 oz) (04/25 0600)  Weight change:  Filed Weights   02/09/18 1815 02/09/18 1844 02/10/18 0600  Weight: 68.4 kg (150 lb 11.2 oz) 68.4 kg (150 lb 11.2 oz) 68.5 kg (151 lb 0.2 oz)    Intake/Output: I/O last 3 completed shifts: In: 48 [P.O.:240; I.V.:6] Out: 950 [Urine:950]   Intake/Output this shift:  No intake/output data recorded.  Physical Exam: General: Critically ill  Head: BIPAP  Eyes: Anicteric, PERRL  Neck: Supple, trachea midline  Lungs:  Bilateral crackles  Heart: tachycardia  Abdomen:  Soft, nontender, obese  Extremities: + peripheral edema.  Neurologic: Following commands  Skin: No lesions   GU Foley with yellow urine    Basic Metabolic Panel: Recent Labs  Lab 02/09/18 1332  NA 138  K 3.6  CL 107  CO2 25  GLUCOSE 112*  BUN 37*  CREATININE 1.00  CALCIUM 8.3*    Liver Function Tests: Recent Labs  Lab 02/09/18 1332  AST 35  ALT 23  ALKPHOS 86  BILITOT 0.4  PROT 6.3*  ALBUMIN 2.7*   No results for input(s): LIPASE, AMYLASE in the last 168 hours. No results for input(s): AMMONIA in the last 168 hours.  CBC: Recent Labs   Lab 02/09/18 1332  WBC 6.3  NEUTROABS 4.9  HGB 8.6*  HCT 25.8*  MCV 89.7  PLT 303    Cardiac Enzymes: Recent Labs  Lab 02/09/18 1332 02/09/18 1853  TROPONINI 0.05* 0.10*    BNP: Invalid input(s): POCBNP  CBG: Recent Labs  Lab 02/10/18 0347  GLUCAP 41    Microbiology: Results for orders placed or performed during the hospital encounter of 02/09/18  MRSA PCR Screening     Status: None   Collection Time: 02/10/18  4:28 AM  Result Value Ref Range Status   MRSA by PCR NEGATIVE NEGATIVE Final    Comment:        The GeneXpert MRSA Assay (FDA approved for NASAL specimens only), is one component of a comprehensive MRSA colonization surveillance program. It is not intended to diagnose MRSA infection nor to guide or monitor treatment for MRSA infections. Performed at [facility], [address] [place], [place] [address]     Coagulation Studies: No results for input(s): LABPROT, INR in the last 72 hours.  Urinalysis: No results for input(s): COLORURINE, LABSPEC, PHURINE, GLUCOSEU, HGBUR, BILIRUBINUR, KETONESUR, PROTEINUR, UROBILINOGEN, NITRITE, LEUKOCYTESUR in the last 72 hours.  Invalid input(s): APPERANCEUR    Imaging: Dg Chest 2 View  Result Date: 02/09/2018 CLINICAL DATA:  Shortness of breath beginning this morning. EXAM: CHEST - 2 VIEW COMPARISON:  PA and lateral chest 12/07/2017.  CT chest 11/29/2016. FINDINGS: Enlargement of the cardiopericardial silhouette consistent with cardiomegaly and pericardial effusion as seen on recent chest CT persists. Bilateral airspace disease identified and is new since the most recent exam. There are small to moderate bilateral pleural effusions. The patient is status post left mastectomy and axillary dissection. IMPRESSION: Marked worsening in extensive bilateral airspace disease with associated small to moderate bilateral pleural effusions is most worrisome for pulmonary edema. Coexistent pneumonia is possible.  Enlarged cardiopericardial silhouette consistent with cardiomegaly and pericardial effusion as seen on recent chest CT. Electronically Signed   By: Melanie Holloway M.D.   On: 02/09/2018 14:35   Dg Chest Port 1 View  Result Date: 02/10/2018 CLINICAL DATA:  Shortness of breath. EXAM: PORTABLE CHEST 1 VIEW COMPARISON:  02/09/2018 FINDINGS: Shallow inspiration. Cardiac enlargement with bilateral pulmonary infiltrates, likely edema. Multifocal pneumonia or ARDS with the other possibilities. Mild progression since previous study. No pneumothorax. Calcification of the aorta. Surgical clips in the left axilla. IMPRESSION: Cardiac enlargement with progressing bilateral pulmonary infiltrates diffusely. Likely due to edema. Electronically Signed   By: Melanie Holloway M.D.   On: 02/10/2018 03:17   [place] Ekg Site Rite  Result Date: 02/10/2018 If Site Rite image not attached, placement could not be confirmed due to current cardiac rhythm.    Medications:   . sodium chloride    . [START ON 02/11/2018] azithromycin    . [START ON 02/11/2018] cefTRIAXone (ROCEPHIN) IVPB 1 gram/100 mL NS (Mini-Bag Plus)     . budesonide (PULMICORT) nebulizer solution  0.25 mg Nebulization BID  . cholecalciferol  2,000 Units Oral Daily  . enoxaparin (LOVENOX) injection  40 mg Subcutaneous Q24H  . furosemide  60 mg Intravenous BID  . hydroxychloroquine  200 mg Oral Daily  . letrozole  2.5 mg Oral Daily  . levalbuterol  0.63 mg Nebulization Q6H  . losartan  50 mg Oral Daily  . magnesium oxide  400 mg Oral Daily  . mouth rinse  15 mL Mouth Rinse BID  . methylPREDNISolone (SOLU-MEDROL) injection  40 mg Intravenous Q12H  . metolazone  5 mg Oral Once  . metoprolol succinate  25 mg Oral Daily  . multivitamin with minerals  1 tablet Oral Daily  . mycophenolate  500 mg Oral BID  . protein supplement shake  11 oz Oral BID BM  . sodium chloride flush  3 mL Intravenous Q12H   sodium chloride, acetaminophen, ipratropium-albuterol,  ondansetron (ZOFRAN) IV, sodium chloride flush, triamcinolone cream  Assessment/ Plan:  [name] is a 52 y.o. black female SLE glomerulonephritis WHO class V (membranous) biopsy on 12/03/2017, hypertension, systolic congestive heart failure, anemia, vitamin D deficiency, breast cancer status post left mastectomy, chemotherapy and radiation who presents as a follow up patient.  1. Acute Respiratory Failure requiring noninvasive ventilation. Respiratory failure seems secondary to pulmonary edema and acute exacerbation of systolic congestive heart failure. However a rheumatologic cause or infective cause is also possible.  - IV furosemnide - Pending new echocardiogram.  - Empiric coverage with azithromycin and ceftriaxone - Stress dose steroids.  - Discussed case with Dr. Alva Garnet, critical care/pulmonology.   2. Lupus Nephritis: class V membranous. with proteinuria, hematuria and ANA positive. History of normal compliments.  Creatinine at baseline - Continue mycophenolate.  - continue losartan - Check urine studies    3. Anemia: beta thalassemia. Hemoglobin 8.6.   4. Hypertension: history of difficult to control. Elevated currently. Volume drive.  - IV furosemide as above.  LOS: 1 Tyrin Herbers 4/25/20198:55 AM

## 2018-02-10 NOTE — Progress Notes (Signed)
[facility] [place] NAME: Melanie Holloway    MR#:  [id number]  DATE OF BIRTH:  11-17-1965  SUBJECTIVE:  CHIEF COMPLAINT:    REVIEW OF SYSTEMS:  CONSTITUTIONAL: No fever, fatigue or weakness.  EYES: No blurred or double vision.  EARS, NOSE, AND THROAT: No tinnitus or ear pain.  RESPIRATORY: No cough, shortness of breath, wheezing or hemoptysis.  CARDIOVASCULAR: No chest pain, orthopnea, edema.  GASTROINTESTINAL: No nausea, vomiting, diarrhea or abdominal pain.  GENITOURINARY: No dysuria, hematuria.  ENDOCRINE: No polyuria, nocturia,  HEMATOLOGY: No anemia, easy bruising or bleeding SKIN: No rash or lesion. MUSCULOSKELETAL: No joint pain or arthritis.   NEUROLOGIC: No tingling, numbness, weakness.  PSYCHIATRY: No anxiety or depression.   DRUG ALLERGIES:  No Known Allergies  VITALS:  Blood pressure (!) 138/101, pulse (!) 127, temperature 99.5 F (37.5 C), temperature source Oral, resp. rate (!) 35, height 4\' 11"  (1.499 m), weight 68.5 kg (151 lb 0.2 oz), SpO2 96 %.  PHYSICAL EXAMINATION:  GENERAL:  52 y.o.-year-old patient lying in the bed with no acute distress.  EYES: Pupils equal, round, reactive to light and accommodation. No scleral icterus. Extraocular muscles intact.  HEENT: Head atraumatic, normocephalic. Oropharynx and nasopharynx clear.  NECK:  Supple, no jugular venous distention. No thyroid enlargement, no tenderness.  LUNGS: Normal breath sounds bilaterally, no wheezing, rales,rhonchi or crepitation. No use of accessory muscles of respiration.  CARDIOVASCULAR: S1, S2 normal. No murmurs, rubs, or gallops.  ABDOMEN: Soft, nontender, nondistended. Bowel sounds present. No organomegaly or mass.  EXTREMITIES: No pedal edema, cyanosis, or clubbing.  NEUROLOGIC: Cranial nerves II through XII are intact. Muscle strength 5/5 in all extremities. Sensation intact. Gait not checked.  PSYCHIATRIC: The patient is alert and oriented  x 3.  SKIN: No obvious rash, lesion, or ulcer.    LABORATORY PANEL:   CBC Recent Labs  Lab 02/09/18 1332  WBC 6.3  HGB 8.6*  HCT 25.8*  PLT 303   ------------------------------------------------------------------------------------------------------------------  Chemistries  Recent Labs  Lab 02/09/18 1332  NA 138  K 3.6  CL 107  CO2 25  GLUCOSE 112*  BUN 37*  CREATININE 1.00  CALCIUM 8.3*  AST 35  ALT 23  ALKPHOS 86  BILITOT 0.4   ------------------------------------------------------------------------------------------------------------------  Cardiac Enzymes Recent Labs  Lab 02/10/18 1358  TROPONINI 0.33*   ------------------------------------------------------------------------------------------------------------------  RADIOLOGY:  Dg Chest 2 View  Result Date: 02/09/2018 CLINICAL DATA:  Shortness of breath beginning this morning. EXAM: CHEST - 2 VIEW COMPARISON:  PA and lateral chest 12/07/2017.  CT chest 11/29/2016. FINDINGS: Enlargement of the cardiopericardial silhouette consistent with cardiomegaly and pericardial effusion as seen on recent chest CT persists. Bilateral airspace disease identified and is new since the most recent exam. There are small to moderate bilateral pleural effusions. The patient is status post left mastectomy and axillary dissection. IMPRESSION: Marked worsening in extensive bilateral airspace disease with associated small to moderate bilateral pleural effusions is most worrisome for pulmonary edema. Coexistent pneumonia is possible. Enlarged cardiopericardial silhouette consistent with cardiomegaly and pericardial effusion as seen on recent chest CT. Electronically Signed   By: Inge Rise M.D.   On: 02/09/2018 14:35   Dg Chest Port 1 View  Result Date: 02/10/2018 CLINICAL DATA:  Shortness of breath. EXAM: PORTABLE CHEST 1 VIEW COMPARISON:  02/09/2018 FINDINGS: Shallow inspiration. Cardiac enlargement with bilateral pulmonary  infiltrates, likely edema. Multifocal pneumonia or ARDS with the other possibilities. Mild progression since previous study. No  pneumothorax. Calcification of the aorta. Surgical clips in the left axilla. IMPRESSION: Cardiac enlargement with progressing bilateral pulmonary infiltrates diffusely. Likely due to edema. Electronically Signed   By: Lucienne Capers M.D.   On: 02/10/2018 03:17   [place] Ekg Site Rite  Result Date: 02/10/2018 If Site Rite image not attached, placement could not be confirmed due to current cardiac rhythm.   EKG:   Orders placed or performed during the hospital encounter of 02/09/18  . ED EKG  . ED EKG  . EKG 12-Lead  . EKG 12-Lead  . EKG 12-Lead  . EKG 12-Lead  . EKG 12-Lead  . EKG 12-Lead    ASSESSMENT AND PLAN:    52 year old female patient with history of lupus erythematosus, congestive heart failure, hypertension, breast cancer presented to demonstrate with difficulty breathing.  Acute respiratory failure with hypoxia secondary to acute systolic CHF exacerbation with pulmonary edema possible underlying lupus pneumonitis On BiPAP, wean off as tolerated Diuretics Steroids Follow-up with pulmonology discussed with Dr. Jamal Collin  Acute systolic CHF exacerbation  Wean off BiPAP as tolerated BNP 1940 Diurese patient with IV Lasix 60 mg every 12 hourly Check echocardiogram  Cardiology consultation Troponin 0.05-0.10-0.33-could be from demand ischemia   Lupus nephritis with proteinuria with history of Systemic lupus erythematosus Supportive care, continue home medication Plaquenil Nephrology is following  Beta thalassemia anemia monitor hemoglobin and hematocrit today hemoglobin is at 8.6   DVT prophylaxis with subcu Lovenox 40 mg daily      All the records are reviewed and case discussed with Care Management/Social Workerr. Management plans discussed with the patient, family and they are in agreement.  CODE STATUS: fc   TOTAL TIME TAKING  CARE OF THIS PATIENT: 35 minutes.   POSSIBLE D/C IN ? DAYS, DEPENDING ON CLINICAL CONDITION.  Note: This dictation was prepared with Dragon dictation along with smaller phrase technology. Any transcriptional errors that result from this process are unintentional.   Nicholes Mango M.D on 02/10/2018 at 3:47 PM  Between 7am to 6pm - Pager - [phone] After 6pm go to [email or web address] - password [email or web address] [facility]  [facility]  Office  [phone]  CC: Primary care physician; Ria Bush, MD

## 2018-02-10 NOTE — Progress Notes (Signed)
Report called to [facility], in ICU. Husband, [name] also updated that pt moving to ICU 11.

## 2018-02-10 NOTE — Progress Notes (Signed)
Pt still very tachypneic HR in the 150's, lungs sound very wet. BP elevated. Pt also nauseated- zofran given with relief.pt has already had a total of 100mg  IV lasix total. MD paged, Dr. Jodell Cipro to put in orders for CXR, bipap & transfer to ICU.

## 2018-02-10 NOTE — Progress Notes (Signed)
Assessed Right arm for Picc placement ( left arm is restricted),unable to find suitable veins.Rt basilic vein PICC would occupy 60%,brachial vein 79% and cephalic vein is too small to measure.Unable to place bedside PICC.May consider other type of central line.

## 2018-02-11 ENCOUNTER — Inpatient Hospital Stay: Payer: 59

## 2018-02-11 DIAGNOSIS — J9621 Acute and chronic respiratory failure with hypoxia: Secondary | ICD-10-CM

## 2018-02-11 DIAGNOSIS — M3214 Glomerular disease in systemic lupus erythematosus: Secondary | ICD-10-CM

## 2018-02-11 DIAGNOSIS — I5043 Acute on chronic combined systolic (congestive) and diastolic (congestive) heart failure: Secondary | ICD-10-CM

## 2018-02-11 LAB — BLOOD GAS, ARTERIAL
Acid-base deficit: 4.8 mmol/L — ABNORMAL HIGH (ref 0.0–2.0)
Bicarbonate: 18.2 mmol/L — ABNORMAL LOW (ref 20.0–28.0)
FIO2: 35
O2 Saturation: 91.1 %
PATIENT TEMPERATURE: 37
PCO2 ART: 25 mmHg — AB (ref 32.0–48.0)
PO2 ART: 57 mmHg — AB (ref 83.0–108.0)
pH, Arterial: 7.47 — ABNORMAL HIGH (ref 7.350–7.450)

## 2018-02-11 LAB — GLUCOSE, CAPILLARY
GLUCOSE-CAPILLARY: 135 mg/dL — AB (ref 65–99)
GLUCOSE-CAPILLARY: 326 mg/dL — AB (ref 65–99)
Glucose-Capillary: 131 mg/dL — ABNORMAL HIGH (ref 65–99)
Glucose-Capillary: 190 mg/dL — ABNORMAL HIGH (ref 65–99)

## 2018-02-11 LAB — COMPREHENSIVE METABOLIC PANEL
ALK PHOS: 72 U/L (ref 38–126)
ALT: 21 U/L (ref 14–54)
AST: 34 U/L (ref 15–41)
Albumin: 2.6 g/dL — ABNORMAL LOW (ref 3.5–5.0)
Anion gap: 10 (ref 5–15)
BILIRUBIN TOTAL: 0.7 mg/dL (ref 0.3–1.2)
BUN: 62 mg/dL — ABNORMAL HIGH (ref 6–20)
CALCIUM: 8.2 mg/dL — AB (ref 8.9–10.3)
CO2: 20 mmol/L — ABNORMAL LOW (ref 22–32)
Chloride: 106 mmol/L (ref 101–111)
Creatinine, Ser: 2.25 mg/dL — ABNORMAL HIGH (ref 0.44–1.00)
GFR, EST AFRICAN AMERICAN: 27 mL/min — AB (ref >60)
GFR, EST NON AFRICAN AMERICAN: 24 mL/min — AB (ref >60)
Glucose, Bld: 133 mg/dL — ABNORMAL HIGH (ref 65–99)
Potassium: 5.7 mmol/L — ABNORMAL HIGH (ref 3.5–5.1)
Sodium: 136 mmol/L (ref 135–145)
TOTAL PROTEIN: 6.3 g/dL — AB (ref 6.5–8.1)

## 2018-02-11 LAB — PROCALCITONIN: PROCALCITONIN: 7.75 ng/mL

## 2018-02-11 LAB — POTASSIUM
POTASSIUM: 5.4 mmol/L — AB (ref 3.5–5.1)
Potassium: 5.9 mmol/L — ABNORMAL HIGH (ref 3.5–5.1)

## 2018-02-11 LAB — TROPONIN I: TROPONIN I: 0.26 ng/mL — AB (ref <0.03)

## 2018-02-11 MED ORDER — INSULIN ASPART 100 UNIT/ML IV SOLN
10.0000 [IU] | Freq: Once | INTRAVENOUS | Status: AC
  Administered 2018-02-11: 10 [IU] via INTRAVENOUS
  Filled 2018-02-11: qty 0.1

## 2018-02-11 MED ORDER — SODIUM CHLORIDE 0.9 % IV SOLN
1.0000 g | Freq: Once | INTRAVENOUS | Status: AC
  Administered 2018-02-11: 1 g via INTRAVENOUS
  Filled 2018-02-11: qty 10

## 2018-02-11 MED ORDER — FUROSEMIDE 10 MG/ML IJ SOLN
10.0000 mg/h | INTRAVENOUS | Status: DC
  Administered 2018-02-11 – 2018-02-13 (×3): 10 mg/h via INTRAVENOUS
  Filled 2018-02-11 (×3): qty 25

## 2018-02-11 MED ORDER — DEXTROSE 50 % IV SOLN
50.0000 mL | Freq: Once | INTRAVENOUS | Status: AC
  Administered 2018-02-11: 50 mL via INTRAVENOUS
  Filled 2018-02-11: qty 50

## 2018-02-11 MED ORDER — ENOXAPARIN SODIUM 30 MG/0.3ML ~~LOC~~ SOLN
30.0000 mg | SUBCUTANEOUS | Status: DC
  Administered 2018-02-11 – 2018-02-14 (×4): 30 mg via SUBCUTANEOUS
  Filled 2018-02-11 (×4): qty 0.3

## 2018-02-11 MED ORDER — DEXTROSE 50 % IV SOLN
1.0000 | Freq: Once | INTRAVENOUS | Status: AC
  Administered 2018-02-11: 50 mL via INTRAVENOUS
  Filled 2018-02-11: qty 50

## 2018-02-11 MED ORDER — POLYETHYLENE GLYCOL 3350 17 G PO PACK
17.0000 g | PACK | Freq: Every day | ORAL | Status: DC
  Administered 2018-02-11: 17 g via ORAL
  Filled 2018-02-11: qty 1

## 2018-02-11 MED ORDER — MAGNESIUM HYDROXIDE 400 MG/5ML PO SUSP: 30.0000 mL | Freq: Every day | ORAL | Status: DC | PRN

## 2018-02-11 NOTE — Progress Notes (Signed)
[facility] at [place] NAME: Melanie Holloway    MR#:  [id number]  DATE OF BIRTH:  01-14-1966  SUBJECTIVE:  CHIEF COMPLAINT: Patient shortness of breath is better off BiPAP and on high flow oxygen  REVIEW OF SYSTEMS:  CONSTITUTIONAL: No fever, fatigue or weakness.  EYES: No blurred or double vision.  EARS, NOSE, AND THROAT: No tinnitus or ear pain.  RESPIRATORY: No cough, reports exertional no shortness of breath, no wheezing or hemoptysis.  CARDIOVASCULAR: No chest pain, orthopnea, edema.  GASTROINTESTINAL: No nausea, vomiting, diarrhea or abdominal pain.  GENITOURINARY: No dysuria, hematuria.  ENDOCRINE: No polyuria, nocturia,  HEMATOLOGY: No anemia, easy bruising or bleeding SKIN: No rash or lesion. MUSCULOSKELETAL: No joint pain or arthritis.   NEUROLOGIC: No tingling, numbness, weakness.  PSYCHIATRY: No anxiety or depression.   DRUG ALLERGIES:  No Known Allergies  VITALS:  Blood pressure (!) 129/93, pulse 96, temperature 98 F (36.7 C), temperature source Oral, resp. rate (!) 36, height 4\' 11"  (1.499 m), weight 68.2 kg (150 lb 5.7 oz), SpO2 (!) 88 %.  PHYSICAL EXAMINATION:  GENERAL:  52 y.o.-year-old patient lying in the bed with no acute distress.  EYES: Pupils equal, round, reactive to light and accommodation. No scleral icterus. Extraocular muscles intact.  HEENT: Head atraumatic, normocephalic. Oropharynx and nasopharynx clear.  NECK:  Supple, no jugular venous distention. No thyroid enlargement, no tenderness.  LUNGS: Moderately diminished has diffuse breath sounds bilaterally, no wheezing, has minimal rales,rhonchi, no crepitation. No use of accessory muscles of respiration.  CARDIOVASCULAR: S1, S2 normal. No murmurs, rubs, or gallops.  ABDOMEN: Soft, nontender, nondistended. Bowel sounds present EXTREMITIES: No pedal edema, cyanosis, or clubbing.  NEUROLOGIC: Cranial nerves II through XII are intact. Muscle strength 5/5  in all extremities. Sensation intact. Gait not checked.  PSYCHIATRIC: The patient is alert and oriented x 3.  SKIN: No obvious rash, lesion, or ulcer.    LABORATORY PANEL:   CBC Recent Labs  Lab 02/10/18 2310  WBC 6.1  HGB 8.0*  HCT 24.0*  PLT 286   ------------------------------------------------------------------------------------------------------------------  Chemistries  Recent Labs  Lab 02/11/18 0409 02/11/18 1225  NA 136  --   K 5.7* 5.4*  CL 106  --   CO2 20*  --   GLUCOSE 133*  --   BUN 62*  --   CREATININE 2.25*  --   CALCIUM 8.2*  --   AST 34  --   ALT 21  --   ALKPHOS 72  --   BILITOT 0.7  --    ------------------------------------------------------------------------------------------------------------------  Cardiac Enzymes Recent Labs  Lab 02/11/18 0409  TROPONINI 0.26*   ------------------------------------------------------------------------------------------------------------------  RADIOLOGY:  Dg Chest Port 1 View  Result Date: 02/11/2018 CLINICAL DATA:  Respiratory failure. EXAM: PORTABLE CHEST 1 VIEW COMPARISON:  02/10/2018. FINDINGS: Right PICC line noted with tip over superior vena cava. Cardiomegaly. Diffuse bilateral pulmonary infiltrates/edema slightly improved from exam. Small right pleural effusion. No pneumothorax. No acute bony abnormality. IMPRESSION: 1.  Right PICC line with tip over superior vena cava. 2. Cardiomegaly. Diffuse bilateral pulmonary infiltrates/edema slightly improved from prior exam. Electronically Signed   By: [place]   On: 02/11/2018 08:49   Dg Chest Port 1 View  Result Date: 02/10/2018 CLINICAL DATA:  Shortness of breath. EXAM: PORTABLE CHEST 1 VIEW COMPARISON:  02/09/2018 FINDINGS: Shallow inspiration. Cardiac enlargement with bilateral pulmonary infiltrates, likely edema. Multifocal pneumonia or ARDS with the other possibilities. Mild progression since previous study.  No pneumothorax. Calcification of  the aorta. Surgical clips in the left axilla. IMPRESSION: Cardiac enlargement with progressing bilateral pulmonary infiltrates diffusely. Likely due to edema. Electronically Signed   By: Lucienne Capers M.D.   On: 02/10/2018 03:17   [place] Ekg Site Rite  Result Date: 02/10/2018 If Site Rite image not attached, placement could not be confirmed due to current cardiac rhythm.  [place] Ekg Site Rite  Result Date: 02/10/2018 If Site Rite image not attached, placement could not be confirmed due to current cardiac rhythm.   EKG:   Orders placed or performed during the hospital encounter of 02/09/18  . ED EKG  . ED EKG  . EKG 12-Lead  . EKG 12-Lead  . EKG 12-Lead  . EKG 12-Lead  . EKG 12-Lead  . EKG 12-Lead    ASSESSMENT AND PLAN:    52 year old female patient with history of lupus erythematosus, congestive heart failure, hypertension, breast cancer presented to demonstrate with difficulty breathing.  Acute respiratory failure with hypoxia secondary to acute systolic CHF exacerbation with pulmonary edema possible underlying lupus pneumonitis On BiPAP, on  high flow oxygen wean off as tolerated Diuretics Steroids Follow-up with pulmonology discussed with Dr, Celesta Aver  Acute systolic CHF exacerbation   off BiPAP  BNP 1940 Diurese patient with IV Lasix 60 has been changed to Lasix drip  check echocardiogram  Cardiology consultation Troponin 0.05-0.10-0.33-could be from demand ischemia   Hyperkalemia with potassium 5.4 Patient is started on Lasix drip which should automatically take care of hyperkalemia We will check potassium at 8 PM today  Lupus nephritis with proteinuria with history of Systemic lupus erythematosus Supportive care, continue home medication Plaquenil Nephrology is following Patient is getting stress dose steroids  Beta thalassemia anemia monitor hemoglobin and hematocrit today hemoglobin is at 8.6   DVT prophylaxis with subcu Lovenox 40 mg  daily      All the records are reviewed and case discussed with Care Management/Social Workerr. Management plans discussed with the patient, family and they are in agreement.  CODE STATUS: fc   TOTAL TIME TAKING CARE OF THIS PATIENT: 35 minutes.   POSSIBLE D/C IN ? DAYS, DEPENDING ON CLINICAL CONDITION.  Note: This dictation was prepared with Dragon dictation along with smaller phrase technology. Any transcriptional errors that result from this process are unintentional.   Nicholes Mango M.D on 02/11/2018 at 3:03 PM  Between 7am to 6pm - Pager - [phone] After 6pm go to [email or web address] - password [email or web address] [facility]  [place] [facility]  Office  [phone]  CC: Primary care physician; Ria Bush, MD
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
## 2018-02-11 NOTE — Progress Notes (Signed)
Patient on VTE prophylaxis w/ lovenox 40 mg subq daily. CrCl < 30 ml/min  Will reduce dose to lovenox 30 mg subq daily.  Tobie Lords, PharmD, BCPS Clinical Pharmacist 02/11/2018

## 2018-02-11 NOTE — Progress Notes (Signed)
[facility]  ROUNDING NOTE   Subjective:   Transitioned to HFNC.   UOP 625  Creatinine 2.25 (1.89)  Patient states she feels better.   Objective:  Vital signs in last 24 hours:  Temp:  [98 F (36.7 C)-99.5 F (37.5 C)] 98 F (36.7 C) (04/26 0800) Pulse Rate:  [98-127] 99 (04/26 0800) Resp:  [31-50] 36 (04/26 0800) BP: (116-146)/(74-102) 144/83 (04/26 0800) SpO2:  [91 %-100 %] 100 % (04/26 0800) FiO2 (%):  [30 %-33 %] 33 % (04/26 0800) Weight:  [68.2 kg (150 lb 5.7 oz)] 68.2 kg (150 lb 5.7 oz) (04/26 0400)  Weight change: 0.16 kg (5.7 oz) Filed Weights   02/09/18 1844 02/10/18 0600 02/11/18 0400  Weight: 68.4 kg (150 lb 11.2 oz) 68.5 kg (151 lb 0.2 oz) 68.2 kg (150 lb 5.7 oz)    Intake/Output: I/O last 3 completed shifts: In: 11 [P.O.:240; I.V.:6] Out: 1025 [Urine:1025]   Intake/Output this shift:  No intake/output data recorded.  Physical Exam: General: Critically ill  Head: HFNC  Eyes: Anicteric, PERRL  Neck: Supple, trachea midline  Lungs:  Bilateral crackles, HFNC  Heart: tachycardia  Abdomen:  Soft, nontender, obese  Extremities: trace peripheral edema.  Neurologic: Following commands  Skin: No lesions   GU Foley with yellow urine    Basic Metabolic Panel: Recent Labs  Lab 02/09/18 1332 02/10/18 2310 02/11/18 0409  NA 138 138 136  K 3.6 5.2* 5.7*  CL 107 105 106  CO2 25 22 20*  GLUCOSE 112* 160* 133*  BUN 37* 56* 62*  CREATININE 1.00 1.89* 2.25*  CALCIUM 8.3* 8.3* 8.2*    Liver Function Tests: Recent Labs  Lab 02/09/18 1332 02/11/18 0409  AST 35 34  ALT 23 21  ALKPHOS 86 72  BILITOT 0.4 0.7  PROT 6.3* 6.3*  ALBUMIN 2.7* 2.6*   No results for input(s): LIPASE, AMYLASE in the last 168 hours. No results for input(s): AMMONIA in the last 168 hours.  CBC: Recent Labs  Lab 02/09/18 1332 02/10/18 2310  WBC 6.3 6.1  NEUTROABS 4.9 4.6  HGB 8.6* 8.0*  HCT 25.8* 24.0*  MCV 89.7 89.1  PLT 303 286    Cardiac  Enzymes: Recent Labs  Lab 02/09/18 1332 02/09/18 1853 02/10/18 1358 02/11/18 0409  TROPONINI 0.05* 0.10* 0.33* 0.26*    BNP: Invalid input(s): POCBNP  CBG: Recent Labs  Lab 02/10/18 0347 02/11/18 0633  GLUCAP 85 131*    Microbiology: Results for orders placed or performed during the hospital encounter of 02/09/18  MRSA PCR Screening     Status: None   Collection Time: 02/10/18  4:28 AM  Result Value Ref Range Status   MRSA by PCR NEGATIVE NEGATIVE Final    Comment:        The GeneXpert MRSA Assay (FDA approved for NASAL specimens only), is one component of a comprehensive MRSA colonization surveillance program. It is not intended to diagnose MRSA infection nor to guide or monitor treatment for MRSA infections. Performed at [facility], [address] [place], [place] [address]     Coagulation Studies: No results for input(s): LABPROT, INR in the last 72 hours.  Urinalysis: Recent Labs    02/10/18 1305  COLORURINE YELLOW*  LABSPEC 1.012  PHURINE 5.0  GLUCOSEU NEGATIVE  HGBUR SMALL*  BILIRUBINUR NEGATIVE  KETONESUR NEGATIVE  PROTEINUR 100*  NITRITE NEGATIVE  LEUKOCYTESUR NEGATIVE      Imaging: Dg Chest 2 View  Result Date: 02/09/2018 CLINICAL DATA:  Shortness of breath beginning this morning. EXAM: CHEST - 2 VIEW COMPARISON:  PA and lateral chest 12/07/2017.  CT chest 11/29/2016. FINDINGS: Enlargement of the cardiopericardial silhouette consistent with cardiomegaly and pericardial effusion as seen on recent chest CT persists. Bilateral airspace disease identified and is new since the most recent exam. There are small to moderate bilateral pleural effusions. The patient is status post left mastectomy and axillary dissection. IMPRESSION: Marked worsening in extensive bilateral airspace disease with associated small to moderate bilateral pleural effusions is most worrisome for pulmonary edema. Coexistent pneumonia is possible. Enlarged  cardiopericardial silhouette consistent with cardiomegaly and pericardial effusion as seen on recent chest CT. Electronically Signed   By: Inge Rise M.D.   On: 02/09/2018 14:35   Dg Chest Port 1 View  Result Date: 02/11/2018 CLINICAL DATA:  Respiratory failure. EXAM: PORTABLE CHEST 1 VIEW COMPARISON:  02/10/2018. FINDINGS: Right PICC line noted with tip over superior vena cava. Cardiomegaly. Diffuse bilateral pulmonary infiltrates/edema slightly improved from exam. Small right pleural effusion. No pneumothorax. No acute bony abnormality. IMPRESSION: 1.  Right PICC line with tip over superior vena cava. 2. Cardiomegaly. Diffuse bilateral pulmonary infiltrates/edema slightly improved from prior exam. Electronically Signed   By: [place]   On: 02/11/2018 08:49   Dg Chest Port 1 View  Result Date: 02/10/2018 CLINICAL DATA:  Shortness of breath. EXAM: PORTABLE CHEST 1 VIEW COMPARISON:  02/09/2018 FINDINGS: Shallow inspiration. Cardiac enlargement with bilateral pulmonary infiltrates, likely edema. Multifocal pneumonia or ARDS with the other possibilities. Mild progression since previous study. No pneumothorax. Calcification of the aorta. Surgical clips in the left axilla. IMPRESSION: Cardiac enlargement with progressing bilateral pulmonary infiltrates diffusely. Likely due to edema. Electronically Signed   By: Lucienne Capers M.D.   On: 02/10/2018 03:17   [place] Ekg Site Rite  Result Date: 02/10/2018 If Site Rite image not attached, placement could not be confirmed due to current cardiac rhythm.  [place] Ekg Site Rite  Result Date: 02/10/2018 If Site Rite image not attached, placement could not be confirmed due to current cardiac rhythm.    Medications:   . sodium chloride     . budesonide (PULMICORT) nebulizer solution  0.5 mg Nebulization BID  . cholecalciferol  2,000 Units Oral Daily  . enoxaparin (LOVENOX) injection  30 mg Subcutaneous Q24H  . furosemide  80 mg Intravenous Q6H  .  hydroxychloroquine  200 mg Oral Daily  . letrozole  2.5 mg Oral Daily  . levalbuterol  0.63 mg Nebulization Q6H  . magnesium oxide  400 mg Oral Daily  . mouth rinse  15 mL Mouth Rinse BID  . methylPREDNISolone (SOLU-MEDROL) injection  40 mg Intravenous Q12H  . metolazone  5 mg Oral Once  . metoprolol succinate  25 mg Oral Daily  . multivitamin with minerals  1 tablet Oral Daily  . mycophenolate  500 mg Oral BID  . protein supplement shake  11 oz Oral BID BM   sodium chloride, acetaminophen, magnesium hydroxide, metoprolol tartrate, ondansetron (ZOFRAN) IV, sodium chloride flush, triamcinolone cream  Assessment/ Plan:  Melanie Holloway is a 52 y.o. black female SLE glomerulonephritis WHO class V (membranous) biopsy on 12/03/2017, hypertension, systolic congestive heart failure, anemia, vitamin D deficiency, breast cancer status post left mastectomy, chemotherapy and radiation who presents as a follow up patient.  1. Acute Respiratory Failure requiring noninvasive ventilation. Respiratory failure seems secondary to pulmonary edema and acute exacerbation of systolic/diastolic congestive heart failure. EF 25-30% on echocardiogram 4/24 - IV furosemide:  change to gtt.  - Stress dose steroids.   2. Acute renal failure with hyperkalemia on Lupus Nephritis: class V membranous. with proteinuria, hematuria and ANA positive. History of normal compliments. 1.45grams of proteinuria Creatinine elevated.  - Continue mycophenolate.  - Holding losartan  3. Anemia: beta thalassemia. Hemoglobin 8.0  4. Hypertension: history of difficult to control. Elevated currently. Volume drive.     LOS: 2 Brylon Brenning 4/26/20199:13 AM

## 2018-02-11 NOTE — Progress Notes (Signed)
Name: Melanie Holloway MRN: [id number] DOB: [birth year]    ADMISSION DATE:  02/09/2018 CONSULTATION DATE: 02/10/2018  REFERRING MD : Dr. Jodell Cipro   CHIEF COMPLAINT: Shortness of Breath   BRIEF PATIENT DESCRIPTION:  52 yo female admitted to telemetry unit with acute on chronic hypoxic respiratory failure secondary to CHF exacerbation and multifocal pneumonia required transfer to stepdown unit on 04/25 due to worsening respiratory failure requiring Bipap  SIGNIFICANT EVENTS  04/24-Pt admitted to telemetry unit  04/25-Pt transferred to stepdown unit with worsening respiratory failure requiring Bipap   STUDIES:  Echo 04/24>>  HISTORY OF PRESENT ILLNESS:   This is a 52 yo female with a PMH of Vitamin D Deficiency, Obesity, Lupus Nephritis, HTN, CKD, CHF, Left Breast Cancer (dx 12/2007), and Anemia.  She presented to [facility] ER 04/24 with acute onset of shortness of breath.  She also endorsed chronic bilateral lower extremity edema although worse than normal.  She initially went to Urgent Care on 04/24 due to symptoms and received a nebulized treatment for wheezing, O2 sats 89% to 97%. Urgent Care NP recommended pt proceed to emergency room for additional treatment.  In the ER CXR revealed pulmonary edema and possible infiltrates, therefore she received 40 mg iv lasix.  She was subsequently admitted to the telemetry unit by hospitalist team for further workup and treatment.  On 04/25 pt developed worsening respiratory failure requiring transfer to stepdown unit for Bipap.     REVIEW OF SYSTEMS:   Patient complains of shortness of breath, denied chest pain  SUBJECTIVE:  Reports that she feels better than yesterday but still short of breath  VITAL SIGNS: Temp:  [98 F (36.7 C)-99.5 F (37.5 C)] 98 F (36.7 C) (04/26 0800) Pulse Rate:  [96-127] 96 (04/26 1300) Resp:  [25-50] 36 (04/26 1300) BP: (116-146)/(74-98) 129/93 (04/26 1300) SpO2:  [88 %-100 %] 88 % (04/26 1300) FiO2 (%):  [33 %] 33  % (04/26 0800) Weight:  [150 lb 5.7 oz (68.2 kg)] 150 lb 5.7 oz (68.2 kg) (04/26 0400)  PHYSICAL EXAMINATION: General: acutely ill appearing female, still mild distress respiratory distress  Neuro: alert and oriented, follows commands  HEENT: supple, JVD present  Cardiovascular: sinus tach, no R/G Lungs: diffuse crackles throughout, labored in tripod position  Abdomen: +BS x4, obese, soft, non tender, non distended  Musculoskeletal: 3+ bilateral lower extremity pitting edema  Skin: intact no rashes or lesions   Recent Labs  Lab 02/09/18 1332 02/10/18 2310 02/11/18 0409 02/11/18 1225  NA 138 138 136  --   K 3.6 5.2* 5.7* 5.4*  CL 107 105 106  --   CO2 25 22 20*  --   BUN 37* 56* 62*  --   CREATININE 1.00 1.89* 2.25*  --   GLUCOSE 112* 160* 133*  --    Recent Labs  Lab 02/09/18 1332 02/10/18 2310  HGB 8.6* 8.0*  HCT 25.8* 24.0*  WBC 6.3 6.1  PLT 303 286   Dg Chest Port 1 View  Result Date: 02/11/2018 CLINICAL DATA:  Respiratory failure. EXAM: PORTABLE CHEST 1 VIEW COMPARISON:  02/10/2018. FINDINGS: Right PICC line noted with tip over superior vena cava. Cardiomegaly. Diffuse bilateral pulmonary infiltrates/edema slightly improved from exam. Small right pleural effusion. No pneumothorax. No acute bony abnormality. IMPRESSION: 1.  Right PICC line with tip over superior vena cava. 2. Cardiomegaly. Diffuse bilateral pulmonary infiltrates/edema slightly improved from prior exam. Electronically Signed   By: [place]   On: 02/11/2018 08:49   Dg  Chest Port 1 View  Result Date: 02/10/2018 CLINICAL DATA:  Shortness of breath. EXAM: PORTABLE CHEST 1 VIEW COMPARISON:  02/09/2018 FINDINGS: Shallow inspiration. Cardiac enlargement with bilateral pulmonary infiltrates, likely edema. Multifocal pneumonia or ARDS with the other possibilities. Mild progression since previous study. No pneumothorax. Calcification of the aorta. Surgical clips in the left axilla. IMPRESSION: Cardiac  enlargement with progressing bilateral pulmonary infiltrates diffusely. Likely due to edema. Electronically Signed   By: Lucienne Capers M.D.   On: 02/10/2018 03:17   [place] Ekg Site Rite  Result Date: 02/10/2018 If Site Rite image not attached, placement could not be confirmed due to current cardiac rhythm.  [place] Ekg Site Rite  Result Date: 02/10/2018 If Site Rite image not attached, placement could not be confirmed due to current cardiac rhythm.   ASSESSMENT / PLAN: Acute on chronic hypoxic respiratory failure secondary to acute systolic and diastolic dysfunction CHF exacerbation   Elevated troponin likely secondary to demand ischemia in setting of respiratory failure Anemia without obvious acute blood loss  Hx: Obesity, Lupus Nephritis, and HTN P: Prn Bipap for dyspnea and HFO for  hypoxia  Scheduled and prn bronchodilator therapy Continue on IV steroids Started on IV lasix drip today Continuous telemetry monitoring  Trend Potassium Trend WBC and monitor fever curve Trend Renal panel Replace electrolytes as indicated  Monitor UOP VTE px: subq lovenox Trend CBC Monitor for s/sx of bleeding and transfuse for hgb <7 HIGH RISK FOR INTUBATION   Cammie Sickle, MD  [place] Pager [phone] (please enter 7 digits)

## 2018-02-12 DIAGNOSIS — I5041 Acute combined systolic (congestive) and diastolic (congestive) heart failure: Secondary | ICD-10-CM

## 2018-02-12 DIAGNOSIS — N179 Acute kidney failure, unspecified: Secondary | ICD-10-CM

## 2018-02-12 LAB — CBC WITH DIFFERENTIAL/PLATELET
BASOS ABS: 0 10*3/uL (ref 0–0.1)
BASOS PCT: 1 %
EOS PCT: 0 %
Eosinophils Absolute: 0 10*3/uL (ref 0–0.7)
HCT: 23 % — ABNORMAL LOW (ref 35.0–47.0)
Hemoglobin: 7.7 g/dL — ABNORMAL LOW (ref 12.0–16.0)
LYMPHS PCT: 27 %
Lymphs Abs: 1.4 10*3/uL (ref 1.0–3.6)
MCH: 29.8 pg (ref 26.0–34.0)
MCHC: 33.4 g/dL (ref 32.0–36.0)
MCV: 89.2 fL (ref 80.0–100.0)
Monocytes Absolute: 0.3 10*3/uL (ref 0.2–0.9)
Monocytes Relative: 5 %
Neutro Abs: 3.4 10*3/uL (ref 1.4–6.5)
Neutrophils Relative %: 67 %
PLATELETS: 268 10*3/uL (ref 150–440)
RBC: 2.58 MIL/uL — AB (ref 3.80–5.20)
RDW: 16.7 % — ABNORMAL HIGH (ref 11.5–14.5)
WBC: 5.1 10*3/uL (ref 3.6–11.0)

## 2018-02-12 LAB — RENAL FUNCTION PANEL
ALBUMIN: 2.5 g/dL — AB (ref 3.5–5.0)
ANION GAP: 11 (ref 5–15)
BUN: 80 mg/dL — AB (ref 6–20)
CO2: 20 mmol/L — ABNORMAL LOW (ref 22–32)
Calcium: 8.6 mg/dL — ABNORMAL LOW (ref 8.9–10.3)
Chloride: 102 mmol/L (ref 101–111)
Creatinine, Ser: 3.04 mg/dL — ABNORMAL HIGH (ref 0.44–1.00)
GFR calc Af Amer: 19 mL/min — ABNORMAL LOW (ref >60)
GFR, EST NON AFRICAN AMERICAN: 16 mL/min — AB (ref >60)
Glucose, Bld: 153 mg/dL — ABNORMAL HIGH (ref 65–99)
PHOSPHORUS: 7.4 mg/dL — AB (ref 2.5–4.6)
POTASSIUM: 5.8 mmol/L — AB (ref 3.5–5.1)
Sodium: 133 mmol/L — ABNORMAL LOW (ref 135–145)

## 2018-02-12 LAB — BRAIN NATRIURETIC PEPTIDE

## 2018-02-12 LAB — PROCALCITONIN: Procalcitonin: 7.46 ng/mL

## 2018-02-12 LAB — POTASSIUM: POTASSIUM: 4.9 mmol/L (ref 3.5–5.1)

## 2018-02-12 MED ORDER — SODIUM CHLORIDE 0.9 % IV SOLN
1.0000 g | INTRAVENOUS | Status: DC
  Administered 2018-02-12 – 2018-02-14 (×3): 1 g via INTRAVENOUS
  Filled 2018-02-12 (×4): qty 1

## 2018-02-12 MED ORDER — DEXTROSE 50 % IV SOLN
50.0000 mL | Freq: Once | INTRAVENOUS | Status: AC
  Administered 2018-02-12: 50 mL via INTRAVENOUS
  Filled 2018-02-12: qty 50

## 2018-02-12 MED ORDER — ALTEPLASE 2 MG IJ SOLR
2.0000 mg | Freq: Once | INTRAMUSCULAR | Status: AC
  Administered 2018-02-12: 2 mg
  Filled 2018-02-12: qty 2

## 2018-02-12 MED ORDER — METOPROLOL SUCCINATE ER 50 MG PO TB24
50.0000 mg | ORAL_TABLET | Freq: Every day | ORAL | Status: DC
  Administered 2018-02-13 – 2018-02-15 (×3): 50 mg via ORAL
  Filled 2018-02-12 (×3): qty 1

## 2018-02-12 MED ORDER — PATIROMER SORBITEX CALCIUM 8.4 G PO PACK
16.8000 g | PACK | Freq: Every day | ORAL | Status: DC
  Administered 2018-02-12 – 2018-02-15 (×4): 16.8 g via ORAL
  Filled 2018-02-12 (×4): qty 4

## 2018-02-12 MED ORDER — INSULIN REGULAR HUMAN 100 UNIT/ML IJ SOLN
10.0000 [IU] | Freq: Once | INTRAMUSCULAR | Status: AC
  Administered 2018-02-12: 10 [IU] via INTRAVENOUS
  Filled 2018-02-12: qty 0.1

## 2018-02-12 NOTE — Progress Notes (Signed)
Name: Melanie Holloway MRN: [id number] DOB: 12/26/1965    ADMISSION DATE:  02/09/2018 CONSULTATION DATE: 02/10/2018  REFERRING MD : Dr. Jodell Cipro   CHIEF COMPLAINT: Shortness of Breath   BRIEF PATIENT DESCRIPTION:  52 yo female admitted to telemetry unit with acute on chronic hypoxic respiratory failure secondary to CHF exacerbation and multifocal pneumonia required transfer to stepdown unit on 04/25 due to worsening respiratory failure requiring Bipap.  SIGNIFICANT EVENTS  04/24-Pt admitted to telemetry unit  04/25-Pt transferred to stepdown unit with worsening respiratory failure requiring Bipap  04/23 HFO, Lasix drip  STUDIES:  Echo 04/24>>  HISTORY OF PRESENT ILLNESS:   This is a 52 yo female with a PMH of Vitamin D Deficiency, Obesity, Lupus Nephritis, HTN, CKD, CHF, Left Breast Cancer (dx 12/2007), and Anemia.  She presented to [facility] ER 04/24 with acute onset of shortness of breath.  She also endorsed chronic bilateral lower extremity edema although worse than normal.  She initially went to Urgent Care on 04/24 due to symptoms and received a nebulized treatment for wheezing, O2 sats 89% to 97%. Urgent Care NP recommended pt proceed to emergency room for additional treatment.  In the ER CXR revealed pulmonary edema and possible infiltrates, therefore she received 40 mg iv lasix.  She was subsequently admitted to the telemetry unit by hospitalist team for further workup and treatment.  On 04/25 pt developed worsening respiratory failure requiring transfer to stepdown unit for Bipap.     REVIEW OF SYSTEMS:   Patient complains of shortness of breath, denied chest pain  SUBJECTIVE:  Reports that she feels better but is still requiring HFO.  VITAL SIGNS: Temp:  [96.8 F (36 C)-98 F (36.7 C)] 97.6 F (36.4 C) (04/27 0800) Pulse Rate:  [87-100] 99 (04/27 0800) Resp:  [24-42] 25 (04/27 0800) BP: (94-136)/(82-105) 123/97 (04/27 0800) SpO2:  [98 %-100 %] 100 % (04/27 1308) FiO2 (%):   [34 %-36 %] 36 % (04/27 1308) Weight:  [154 lb 5.2 oz (70 kg)] 154 lb 5.2 oz (70 kg) (04/27 0349)  PHYSICAL EXAMINATION: General: comfortable on HFO Neuro: alert and oriented, follows commands  HEENT: supple, JVD present  Cardiovascular: sinus tach, no R/G Lungs: diffuse crackles throughout Abdomen: +BS x4, obese, soft, non tender, non distended  Musculoskeletal: 3+ bilateral lower extremity pitting edema  Skin: intact no rashes or lesions   Recent Labs  Lab 02/10/18 2310 02/11/18 0409  02/11/18 1956 02/12/18 0343 02/12/18 1019  NA 138 136  --   --  133*  --   K 5.2* 5.7*   < > 5.9* 5.8* 4.9  CL 105 106  --   --  102  --   CO2 22 20*  --   --  20*  --   BUN 56* 62*  --   --  80*  --   CREATININE 1.89* 2.25*  --   --  3.04*  --   GLUCOSE 160* 133*  --   --  153*  --    < > = values in this interval not displayed.   Recent Labs  Lab 02/09/18 1332 02/10/18 2310 02/12/18 0343  HGB 8.6* 8.0* 7.7*  HCT 25.8* 24.0* 23.0*  WBC 6.3 6.1 5.1  PLT 303 286 268   Dg Chest Port 1 View  Result Date: 02/11/2018 CLINICAL DATA:  Respiratory failure. EXAM: PORTABLE CHEST 1 VIEW COMPARISON:  02/10/2018. FINDINGS: Right PICC line noted with tip over superior vena cava. Cardiomegaly. Diffuse bilateral pulmonary infiltrates/edema slightly  improved from exam. Small right pleural effusion. No pneumothorax. No acute bony abnormality. IMPRESSION: 1.  Right PICC line with tip over superior vena cava. 2. Cardiomegaly. Diffuse bilateral pulmonary infiltrates/edema slightly improved from prior exam. Electronically Signed   By: [place]   On: 02/11/2018 08:49   [place] Ekg Site Rite  Result Date: 02/10/2018 If Site Rite image not attached, placement could not be confirmed due to current cardiac rhythm.   ASSESSMENT / PLAN: Acute on chronic hypoxic respiratory failure secondary to acute systolic and diastolic dysfunction CHF exacerbation   Elevated troponin likely secondary to demand ischemia in  setting of respiratory failure Acute Renal Failure with oliguria Severe Cardiomyopathy Valvular heart dz- mod AR, mod MR Anemia without obvious acute blood loss  Hx: Obesity, Lupus Nephritis, and HTN P: Prn Bipap for dyspnea and HFO for  Hypoxia- wean as able  Scheduled and prn bronchodilator therapy Continue on IV steroids Continue on IV lasix drip per nephrology team Continuous telemetry monitoring  Trend Potassium, may need to D/C beta blocker if K+ still labile Trend WBC and monitor fever curve Trend Renal panel  Monitor UOP VTE px: subq lovenox Trend CBC Monitor for s/sx of bleeding and transfuse for hgb <7 Will start empirical ABX for underlying infection since patient is immune compromise HIGH RISK FOR INTUBATION   Cammie Sickle, MD  [place] Pager [phone] (please enter 7 digits)

## 2018-02-12 NOTE — Progress Notes (Signed)
[facility]  ROUNDING NOTE   Subjective:   HFNC.   Sitting up eating breakfast.   UOP 272mL recorded  Creatinine 3.04 (2.25) (1.89)  Patient states she feels better.   Objective:  Vital signs in last 24 hours:  Temp:  [96.8 F (36 C)-98 F (36.7 C)] 97.6 F (36.4 C) (04/27 0800) Pulse Rate:  [87-102] 99 (04/27 0800) Resp:  [24-42] 25 (04/27 0800) BP: (94-144)/(82-105) 123/97 (04/27 0800) SpO2:  [88 %-100 %] 100 % (04/27 0800) FiO2 (%):  [33 %-36 %] 36 % (04/27 0700) Weight:  [70 kg (154 lb 5.2 oz)] 70 kg (154 lb 5.2 oz) (04/27 0349)  Weight change: 1.8 kg (3 lb 15.5 oz) Filed Weights   02/10/18 0600 02/11/18 0400 02/12/18 0349  Weight: 68.5 kg (151 lb 0.2 oz) 68.2 kg (150 lb 5.7 oz) 70 kg (154 lb 5.2 oz)    Intake/Output: I/O last 3 completed shifts: In: 196.3 [I.V.:196.3] Out: 465 [Urine:465]   Intake/Output this shift:  Total I/O In: 20 [I.V.:20] Out: -   Physical Exam: General: Critically ill  Head: HFNC  Eyes: Anicteric, PERRL  Neck: Supple, trachea midline  Lungs:  Bilateral crackles, HFNC  Heart: regular  Abdomen:  Soft, nontender, obese  Extremities: 1+ peripheral edema.  Neurologic: Following commands, alert and oriented  Skin: No lesions   GU Foley with clear yellow urine    Basic Metabolic Panel: Recent Labs  Lab 02/09/18 1332 02/10/18 2310 02/11/18 0409 02/11/18 1225 02/11/18 1956 02/12/18 0343  NA 138 138 136  --   --  133*  K 3.6 5.2* 5.7* 5.4* 5.9* 5.8*  CL 107 105 106  --   --  102  CO2 25 22 20*  --   --  20*  GLUCOSE 112* 160* 133*  --   --  153*  BUN 37* 56* 62*  --   --  80*  CREATININE 1.00 1.89* 2.25*  --   --  3.04*  CALCIUM 8.3* 8.3* 8.2*  --   --  8.6*  PHOS  --   --   --   --   --  7.4*    Liver Function Tests: Recent Labs  Lab 02/09/18 1332 02/11/18 0409 02/12/18 0343  AST 35 34  --   ALT 23 21  --   ALKPHOS 86 72  --   BILITOT 0.4 0.7  --   PROT 6.3* 6.3*  --   ALBUMIN 2.7* 2.6* 2.5*    No results for input(s): LIPASE, AMYLASE in the last 168 hours. No results for input(s): AMMONIA in the last 168 hours.  CBC: Recent Labs  Lab 02/09/18 1332 02/10/18 2310 02/12/18 0343  WBC 6.3 6.1 5.1  NEUTROABS 4.9 4.6 3.4  HGB 8.6* 8.0* 7.7*  HCT 25.8* 24.0* 23.0*  MCV 89.7 89.1 89.2  PLT 303 286 268    Cardiac Enzymes: Recent Labs  Lab 02/09/18 1332 02/09/18 1853 02/10/18 1358 02/11/18 0409  TROPONINI 0.05* 0.10* 0.33* 0.26*    BNP: Invalid input(s): POCBNP  CBG: Recent Labs  Lab 02/10/18 0347 02/11/18 0633 02/11/18 1519 02/11/18 2157 02/11/18 2302  GLUCAP 85 131* 135* 326* 190*    Microbiology: Results for orders placed or performed during the hospital encounter of 02/09/18  MRSA PCR Screening     Status: None   Collection Time: 02/10/18  4:28 AM  Result Value Ref Range Status   MRSA by PCR NEGATIVE NEGATIVE Final    Comment:  The GeneXpert MRSA Assay (FDA approved for NASAL specimens only), is one component of a comprehensive MRSA colonization surveillance program. It is not intended to diagnose MRSA infection nor to guide or monitor treatment for MRSA infections. Performed at [facility], [address] [place], [place] [address]     Coagulation Studies: No results for input(s): LABPROT, INR in the last 72 hours.  Urinalysis: Recent Labs    02/10/18 1305  COLORURINE YELLOW*  LABSPEC 1.012  PHURINE 5.0  GLUCOSEU NEGATIVE  HGBUR SMALL*  BILIRUBINUR NEGATIVE  KETONESUR NEGATIVE  PROTEINUR 100*  NITRITE NEGATIVE  LEUKOCYTESUR NEGATIVE      Imaging: Dg Chest Port 1 View  Result Date: 02/11/2018 CLINICAL DATA:  Respiratory failure. EXAM: PORTABLE CHEST 1 VIEW COMPARISON:  02/10/2018. FINDINGS: Right PICC line noted with tip over superior vena cava. Cardiomegaly. Diffuse bilateral pulmonary infiltrates/edema slightly improved from exam. Small right pleural effusion. No pneumothorax. No acute bony abnormality.  IMPRESSION: 1.  Right PICC line with tip over superior vena cava. 2. Cardiomegaly. Diffuse bilateral pulmonary infiltrates/edema slightly improved from prior exam. Electronically Signed   By: [place]   On: 02/11/2018 08:49   [place] Ekg Site Rite  Result Date: 02/10/2018 If Site Rite image not attached, placement could not be confirmed due to current cardiac rhythm.    Medications:   . sodium chloride    . furosemide (LASIX) infusion 10 mg/hr (02/11/18 1930)   . budesonide (PULMICORT) nebulizer solution  0.5 mg Nebulization BID  . cholecalciferol  2,000 Units Oral Daily  . enoxaparin (LOVENOX) injection  30 mg Subcutaneous Q24H  . hydroxychloroquine  200 mg Oral Daily  . letrozole  2.5 mg Oral Daily  . levalbuterol  0.63 mg Nebulization Q6H  . magnesium oxide  400 mg Oral Daily  . mouth rinse  15 mL Mouth Rinse BID  . methylPREDNISolone (SOLU-MEDROL) injection  40 mg Intravenous Q12H  . metoprolol succinate  25 mg Oral Daily  . multivitamin with minerals  1 tablet Oral Daily  . mycophenolate  500 mg Oral BID  . polyethylene glycol  17 g Oral Daily  . protein supplement shake  11 oz Oral BID BM   sodium chloride, acetaminophen, magnesium hydroxide, metoprolol tartrate, ondansetron (ZOFRAN) IV, sodium chloride flush, triamcinolone cream  Assessment/ Plan:  Ms. Luella Gardenhire is a 52 y.o. black female SLE glomerulonephritis WHO class V (membranous) biopsy on 12/03/2017, hypertension, systolic congestive heart failure, anemia, vitamin D deficiency, breast cancer status post left mastectomy, chemotherapy and radiation who presents as a follow up patient.  1. Acute Respiratory Failure requiring noninvasive ventilation. Respiratory failure seems secondary to pulmonary edema and acute exacerbation of systolic/diastolic congestive heart failure. EF 25-30% on echocardiogram 4/24 - IV furosemide gtt.  - Stress dose steroids.   2. Acute renal failure with hyperkalemia on Lupus Nephritis:  class V membranous. with proteinuria, hematuria and ANA positive. History of normal compliments. 1.45 grams of proteinuria Creatinine elevated.  - Continue mycophenolate. Stress dose steroids.  - Holding losartan - Start Veltassa   3. Anemia: beta thalassemia. Hemoglobin 7.7  4. Hypertension: history of difficult to control. Diastolic elevated currently. Volume driven. Holding Losartan due to hyperkalemia and acute renal failure.  - metoprolol ordered    LOS: 3 Melanie Holloway 4/27/20198:50 AM

## 2018-02-12 NOTE — Progress Notes (Signed)
[facility]  SUBJECTIVE: Patient lying in bed, denies chest pain, overall reports feeling better,   Vitals:   02/12/18 0500 02/12/18 0600 02/12/18 0700 02/12/18 0800  BP: (!) 128/95 (!) 132/96 (!) 135/97 (!) 123/97  Pulse: 88 91 87 99  Resp: (!) 26 (!) 24 (!) 32 (!) 25  Temp:    97.6 F (36.4 C)  TempSrc:    Axillary  SpO2: 100% 100% 100% 100%  Weight:      Height:         Intake/Output Summary (Last 24 hours) at 02/12/2018 1107 Last data filed at 02/12/2018 1021 Gross per 24 hour  Intake 456.33 ml  Output 290 ml  Net 166.33 ml      PHYSICAL EXAM  General: Well developed, well nourished, in no acute distress HEENT:  Normocephalic and atramatic Neck:  No JVD.  Lungs: Clear bilaterally to auscultation and percussion. Heart: HRRR . Normal S1 and S2 without gallops or murmurs.  Abdomen: Bowel sounds are positive, abdomen soft and non-tender  Msk:  Back normal, normal gait. Normal strength and tone for age. Extremities: No clubbing, cyanosis or edema.   Neuro: Alert and oriented X 3. Psych:  Good affect, responds appropriately   LABS: Basic Metabolic Panel: Recent Labs    02/11/18 0409  02/12/18 0343 02/12/18 1019  NA 136  --  133*  --   K 5.7*   < > 5.8* 4.9  CL 106  --  102  --   CO2 20*  --  20*  --   GLUCOSE 133*  --  153*  --   BUN 62*  --  80*  --   CREATININE 2.25*  --  3.04*  --   CALCIUM 8.2*  --  8.6*  --   PHOS  --   --  7.4*  --    < > = values in this interval not displayed.   Liver Function Tests: Recent Labs    02/09/18 1332 02/11/18 0409 02/12/18 0343  AST 35 34  --   ALT 23 21  --   ALKPHOS 86 72  --   BILITOT 0.4 0.7  --   PROT 6.3* 6.3*  --   ALBUMIN 2.7* 2.6* 2.5*   No results for input(s): LIPASE, AMYLASE in the last 72 hours. CBC: Recent Labs    02/10/18 2310 02/12/18 0343  WBC 6.1 5.1  NEUTROABS 4.6 3.4  HGB 8.0* 7.7*  HCT 24.0* 23.0*  MCV 89.1 89.2  PLT 286 268   Cardiac Enzymes: Recent Labs    02/09/18 1853  02/10/18 1358 02/11/18 0409  TROPONINI 0.10* 0.33* 0.26*   BNP: Invalid input(s): POCBNP D-Dimer: No results for input(s): DDIMER in the last 72 hours. Hemoglobin A1C: No results for input(s): HGBA1C in the last 72 hours. Fasting Lipid Panel: No results for input(s): CHOL, HDL, LDLCALC, TRIG, CHOLHDL, LDLDIRECT in the last 72 hours. Thyroid Function Tests: No results for input(s): TSH, T4TOTAL, T3FREE, THYROIDAB in the last 72 hours.  Invalid input(s): FREET3 Anemia Panel: No results for input(s): VITAMINB12, FOLATE, FERRITIN, TIBC, IRON, RETICCTPCT in the last 72 hours.  Dg Chest Port 1 View  Result Date: 02/11/2018 CLINICAL DATA:  Respiratory failure. EXAM: PORTABLE CHEST 1 VIEW COMPARISON:  02/10/2018. FINDINGS: Right PICC line noted with tip over superior vena cava. Cardiomegaly. Diffuse bilateral pulmonary infiltrates/edema slightly improved from exam. Small right pleural effusion. No pneumothorax. No acute bony abnormality. IMPRESSION: 1.  Right PICC line with tip over superior  vena cava. 2. Cardiomegaly. Diffuse bilateral pulmonary infiltrates/edema slightly improved from prior exam. Electronically Signed   By: [place]   On: 02/11/2018 08:49   [place] Ekg Site Rite  Result Date: 02/10/2018 If Site Rite image not attached, placement could not be confirmed due to current cardiac rhythm.    Echo LVEF 25 to 30%  TELEMETRY: Normal sinus rhythm with right bundle branch block:  ASSESSMENT AND PLAN:  Active Problems:   CHF (congestive heart failure) ([place])    1.  Acute on chronic systolic congestive heart failure, LVEF 25 to 30% 2.  Acute renal failure with hyperkalemia, with underlying lupus nephritis 3.  Borderline elevated troponin, in the absence of chest pain or new ECG changes, likely demand supply ischemia  Recommendations  1.  Agree with overall current therapy 2.  Continue furosemide drip 3.  Defer for this anticoagulation 4.  Defer further cardiac  diagnostics at this time 5.  Uptitrate metoprolol succinate to 50 mg daily   Isaias Cowman, MD, PhD, [facility] 02/12/2018 11:07 AM

## 2018-02-12 NOTE — Therapy (Signed)
BIPAP is on standby-Pt is currently tolerating high flow nasal cannula well.

## 2018-02-12 NOTE — Progress Notes (Signed)
[place] [facility] [place] NAME: Melanie Holloway    MR#:  [id number]  DATE OF BIRTH:  June 06, 1966  SUBJECTIVE: Patient short of breath, now on high flow nasal cannula, BiPAP is off.  Urine output is less, on Lasix drip/she denies any complaints.  CHIEF COMPLAINT:   REVIEW OF SYSTEMS:  CONSTITUTIONAL: No fever, fatigue or weakness.  EYES: No blurred or double vision.  EARS, NOSE, AND THROAT: No tinnitus or ear pain.  RESPIRATORY: Shortness of breath.  Now on high flow nasal cannula no wheezing or hemoptysis.  CARDIOVASCULAR: No chest pain, orthopnea, edema.  GASTROINTESTINAL: No nausea, vomiting, diarrhea or abdominal pain.  GENITOURINARY: No dysuria, hematuria.  ENDOCRINE: No polyuria, nocturia,  HEMATOLOGY: No anemia, easy bruising or bleeding SKIN: No rash or lesion.  Bilateral lower extremity edema MUSCULOSKELETAL: No joint pain or arthritis.   NEUROLOGIC: No tingling, numbness, weakness.  PSYCHIATRY: No anxiety or depression.   DRUG ALLERGIES:  No Known Allergies  VITALS:  Blood pressure (!) 123/97, pulse 99, temperature 97.6 F (36.4 C), temperature source Axillary, resp. rate (!) 25, height 4\' 11"  (1.499 m), weight 70 kg (154 lb 5.2 oz), SpO2 100 %.  PHYSICAL EXAMINATION:  GENERAL:  52 y.o.-year-old patient lying in the bed with no acute distress.  EYES: Pupils equal, round, reactive to light and accommodation. No scleral icterus. Extraocular muscles intact.  HEENT: Head atraumatic, normocephalic. Oropharynx and nasopharynx clear.  NECK:  Supple, no jugular venous distention. No thyroid enlargement, no tenderness.  LUNGS: Moderately diminished has diffuse breath sounds bilaterally, no wheezing, has minimal rales,rhonchi, no crepitation. No use of accessory muscles of respiration.  CARDIOVASCULAR: S1, S2 normal. No murmurs, rubs, or gallops.  ABDOMEN: Soft, nontender, nondistended. Bowel sounds present EXTREMITIES patient has 2+  pitting edema bilaterally nEUROLOGIC: Cranial nerves II through XII are intact. Muscle strength 5/5 in all extremities. Sensation intact. Gait not checked.  PSYCHIATRIC: The patient is alert and oriented x 3.  SKIN: No obvious rash, lesion, or ulcer.    LABORATORY PANEL:   CBC Recent Labs  Lab 02/12/18 0343  WBC 5.1  HGB 7.7*  HCT 23.0*  PLT 268   ------------------------------------------------------------------------------------------------------------------  Chemistries  Recent Labs  Lab 02/11/18 0409  02/12/18 0343 02/12/18 1019  NA 136  --  133*  --   K 5.7*   < > 5.8* 4.9  CL 106  --  102  --   CO2 20*  --  20*  --   GLUCOSE 133*  --  153*  --   BUN 62*  --  80*  --   CREATININE 2.25*  --  3.04*  --   CALCIUM 8.2*  --  8.6*  --   AST 34  --   --   --   ALT 21  --   --   --   ALKPHOS 72  --   --   --   BILITOT 0.7  --   --   --    < > = values in this interval not displayed.   ------------------------------------------------------------------------------------------------------------------  Cardiac Enzymes Recent Labs  Lab 02/11/18 0409  TROPONINI 0.26*   ------------------------------------------------------------------------------------------------------------------  RADIOLOGY:  Dg Chest Port 1 View  Result Date: 02/11/2018 CLINICAL DATA:  Respiratory failure. EXAM: PORTABLE CHEST 1 VIEW COMPARISON:  02/10/2018. FINDINGS: Right PICC line noted with tip over superior vena cava. Cardiomegaly. Diffuse bilateral pulmonary infiltrates/edema slightly improved from exam. Small right pleural effusion. No pneumothorax. No  acute bony abnormality. IMPRESSION: 1.  Right PICC line with tip over superior vena cava. 2. Cardiomegaly. Diffuse bilateral pulmonary infiltrates/edema slightly improved from prior exam. Electronically Signed   By: [place]   On: 02/11/2018 08:49   [place] Ekg Site Rite  Result Date: 02/10/2018 If Site Rite image not attached, placement could  not be confirmed due to current cardiac rhythm.   EKG:   Orders placed or performed during the hospital encounter of 02/09/18  . ED EKG  . ED EKG  . EKG 12-Lead  . EKG 12-Lead  . EKG 12-Lead  . EKG 12-Lead  . EKG 12-Lead  . EKG 12-Lead    ASSESSMENT AND PLAN:    52 year old female patient with history of lupus erythematosus, congestive heart failure, hypertension, breast cancer presented to demonstrate with difficulty breathing.  Acute respiratory failure with hypoxia secondary to acute systolic CHF exacerbation with pulmonary edema possible underlying lupus pneumonitis Weaned off the BiPAP, continue high flow nasal cannula,  on Lasix drip.  Monitor urine output closely.  Wean off oxygen as tolerated.  Acute systolic CHF exacerbation   off BiPAP, EF is 25 to 30% BNP 1940 Diurese patient with IV Lasix 60 has been changed to Lasix drip  check echocardiogram  Cardiology consultation Troponin 0.05-0.10-0.33-could be from demand ischemia   Hyperkalemia with potassium 5.4 Patient is started on Lasix drip, valtessa.    Lupus nephritis with proteinuria with history of Systemic lupus erythematosus Supportive care, continue home medication Plaquenil, stress dose steroids, hold losartan.  Patient has hematuria, proteinuria, ANA positive. Nephrology is following Patient is getting stress dose steroids  Beta thalassemia anemia monitor hemoglobin and hematocrit today hemoglobin is at 8.6   DVT prophylaxis with subcu Lovenox 40 mg daily      All the records are reviewed and case discussed with Care Management/Social Workerr. Management plans discussed with the patient, family and they are in agreement.  CODE STATUS: fc   TOTAL TIME TAKING CARE OF THIS PATIENT: 35 minutes.   POSSIBLE D/C IN ? DAYS, DEPENDING ON CLINICAL CONDITION.  Note: This dictation was prepared with Dragon dictation along with smaller phrase technology. Any transcriptional errors that result from this  process are unintentional.   Epifanio Lesches M.D on 02/12/2018 at 1:15 PM  Between 7am to 6pm - Pager - [phone] After 6pm go to [email or web address] - password [email or web address] [facility]  [facility]  Office  [phone]  CC: Primary care physician; Ria Bush, MD

## 2018-02-13 ENCOUNTER — Inpatient Hospital Stay: Payer: 59

## 2018-02-13 DIAGNOSIS — I38 Endocarditis, valve unspecified: Secondary | ICD-10-CM

## 2018-02-13 DIAGNOSIS — I428 Other cardiomyopathies: Secondary | ICD-10-CM

## 2018-02-13 LAB — RENAL FUNCTION PANEL
ALBUMIN: 2.6 g/dL — AB (ref 3.5–5.0)
ANION GAP: 11 (ref 5–15)
BUN: 96 mg/dL — ABNORMAL HIGH (ref 6–20)
CALCIUM: 8.5 mg/dL — AB (ref 8.9–10.3)
CO2: 20 mmol/L — ABNORMAL LOW (ref 22–32)
CREATININE: 3.37 mg/dL — AB (ref 0.44–1.00)
Chloride: 101 mmol/L (ref 101–111)
GFR, EST AFRICAN AMERICAN: 17 mL/min — AB (ref >60)
GFR, EST NON AFRICAN AMERICAN: 15 mL/min — AB (ref >60)
Glucose, Bld: 190 mg/dL — ABNORMAL HIGH (ref 65–99)
PHOSPHORUS: 7.8 mg/dL — AB (ref 2.5–4.6)
Potassium: 5.5 mmol/L — ABNORMAL HIGH (ref 3.5–5.1)
SODIUM: 132 mmol/L — AB (ref 135–145)

## 2018-02-13 LAB — CBC
HCT: 22.1 % — ABNORMAL LOW (ref 35.0–47.0)
Hemoglobin: 7.4 g/dL — ABNORMAL LOW (ref 12.0–16.0)
MCH: 30 pg (ref 26.0–34.0)
MCHC: 33.6 g/dL (ref 32.0–36.0)
MCV: 89.4 fL (ref 80.0–100.0)
PLATELETS: 246 10*3/uL (ref 150–440)
RBC: 2.47 MIL/uL — ABNORMAL LOW (ref 3.80–5.20)
RDW: 16.4 % — AB (ref 11.5–14.5)
WBC: 4.4 10*3/uL (ref 3.6–11.0)

## 2018-02-13 LAB — PROCALCITONIN: PROCALCITONIN: 5.35 ng/mL

## 2018-02-13 LAB — MAGNESIUM: Magnesium: 2.3 mg/dL (ref 1.7–2.4)

## 2018-02-13 MED ORDER — HYDRALAZINE HCL 20 MG/ML IJ SOLN
5.0000 mg | INTRAMUSCULAR | Status: DC | PRN
  Administered 2018-02-14: 5 mg via INTRAVENOUS
  Filled 2018-02-13: qty 1

## 2018-02-13 MED ORDER — PANTOPRAZOLE SODIUM 40 MG PO TBEC
40.0000 mg | DELAYED_RELEASE_TABLET | Freq: Every day | ORAL | Status: DC
  Administered 2018-02-14 – 2018-02-15 (×2): 40 mg via ORAL
  Filled 2018-02-13 (×2): qty 1

## 2018-02-13 MED ORDER — LEVALBUTEROL HCL 0.63 MG/3ML IN NEBU
0.6300 mg | INHALATION_SOLUTION | Freq: Three times a day (TID) | RESPIRATORY_TRACT | Status: DC
  Administered 2018-02-14 – 2018-02-15 (×4): 0.63 mg via RESPIRATORY_TRACT
  Filled 2018-02-13 (×4): qty 3

## 2018-02-13 MED ORDER — HYDRALAZINE HCL 20 MG/ML IJ SOLN
5.0000 mg | Freq: Once | INTRAMUSCULAR | Status: AC
  Administered 2018-02-13: 5 mg via INTRAVENOUS

## 2018-02-13 MED ORDER — VITAL HIGH PROTEIN PO LIQD: 1000.0000 mL | ORAL | Status: DC

## 2018-02-13 NOTE — Progress Notes (Signed)
[facility]  ROUNDING NOTE   Subjective:   [name] O2. Breathing comfortably laying bed.   UOP 586mL recorded  Creatinine 3.37 (3.04) (2.25) (1.89)  Patient states she feels better.   Veltassa ordered.   Objective:  Vital signs in last 24 hours:  Temp:  [97.3 F (36.3 C)-98.3 F (36.8 C)] 97.9 F (36.6 C) (04/28 0715) Pulse Rate:  [80-93] 84 (04/28 1000) Resp:  [16-30] 22 (04/28 1000) BP: (104-136)/(72-105) 127/98 (04/28 1000) SpO2:  [97 %-100 %] 100 % (04/28 1000) FiO2 (%):  [36 %] 36 % (04/28 0726) Weight:  [72.6 kg (160 lb 0.9 oz)] 72.6 kg (160 lb 0.9 oz) (04/28 0301)  Weight change: 2.6 kg (5 lb 11.7 oz) Filed Weights   02/11/18 0400 02/12/18 0349 02/13/18 0301  Weight: 68.2 kg (150 lb 5.7 oz) 70 kg (154 lb 5.2 oz) 72.6 kg (160 lb 0.9 oz)    Intake/Output: I/O last 3 completed shifts: In: 46 [P.O.:240; I.V.:320; IV Piggyback:100] Out: 735 [Urine:735]   Intake/Output this shift:  Total I/O In: 240 [P.O.:240] Out: 50 [Urine:50]  Physical Exam: General: Ill appearing  Head: N/C  Eyes: Anicteric, PERRL  Neck: Supple, trachea midline  Lungs:  Basilar crackles, 4 L   Heart: regular  Abdomen:  Soft, nontender, obese  Extremities: 1+ peripheral edema.  Neurologic: Following commands, alert and oriented  Skin: No lesions   GU Foley with clear yellow urine    Basic Metabolic Panel: Recent Labs  Lab 02/09/18 1332 02/10/18 2310 02/11/18 0409 02/11/18 1225 02/11/18 1956 02/12/18 0343 02/12/18 1019 02/13/18 0415 02/13/18 0438  NA 138 138 136  --   --  133*  --  132*  --   K 3.6 5.2* 5.7* 5.4* 5.9* 5.8* 4.9 5.5*  --   CL 107 105 106  --   --  102  --  101  --   CO2 25 22 20*  --   --  20*  --  20*  --   GLUCOSE 112* 160* 133*  --   --  153*  --  190*  --   BUN 37* 56* 62*  --   --  80*  --  96*  --   CREATININE 1.00 1.89* 2.25*  --   --  3.04*  --  3.37*  --   CALCIUM 8.3* 8.3* 8.2*  --   --  8.6*  --  8.5*  --   MG  --   --   --   --    --   --   --   --  2.3  PHOS  --   --   --   --   --  7.4*  --  7.8*  --     Liver Function Tests: Recent Labs  Lab 02/09/18 1332 02/11/18 0409 02/12/18 0343 02/13/18 0415  AST 35 34  --   --   ALT 23 21  --   --   ALKPHOS 86 72  --   --   BILITOT 0.4 0.7  --   --   PROT 6.3* 6.3*  --   --   ALBUMIN 2.7* 2.6* 2.5* 2.6*   No results for input(s): LIPASE, AMYLASE in the last 168 hours. No results for input(s): AMMONIA in the last 168 hours.  CBC: Recent Labs  Lab 02/09/18 1332 02/10/18 2310 02/12/18 0343 02/13/18 0438  WBC 6.3 6.1 5.1 4.4  NEUTROABS 4.9 4.6 3.4  --  HGB 8.6* 8.0* 7.7* 7.4*  HCT 25.8* 24.0* 23.0* 22.1*  MCV 89.7 89.1 89.2 89.4  PLT 303 286 268 246    Cardiac Enzymes: Recent Labs  Lab 02/09/18 1332 02/09/18 1853 02/10/18 1358 02/11/18 0409  TROPONINI 0.05* 0.10* 0.33* 0.26*    BNP: Invalid input(s): POCBNP  CBG: Recent Labs  Lab 02/10/18 0347 02/11/18 0633 02/11/18 1519 02/11/18 2157 02/11/18 2302  GLUCAP 85 131* 135* 326* 190*    Microbiology: Results for orders placed or performed during the hospital encounter of 02/09/18  MRSA PCR Screening     Status: None   Collection Time: 02/10/18  4:28 AM  Result Value Ref Range Status   MRSA by PCR NEGATIVE NEGATIVE Final    Comment:        The GeneXpert MRSA Assay (FDA approved for NASAL specimens only), is one component of a comprehensive MRSA colonization surveillance program. It is not intended to diagnose MRSA infection nor to guide or monitor treatment for MRSA infections. Performed at [facility], [address] [place], [place] [address]     Coagulation Studies: No results for input(s): LABPROT, INR in the last 72 hours.  Urinalysis: Recent Labs    02/10/18 1305  COLORURINE YELLOW*  LABSPEC 1.012  PHURINE 5.0  GLUCOSEU NEGATIVE  HGBUR SMALL*  BILIRUBINUR NEGATIVE  KETONESUR NEGATIVE  PROTEINUR 100*  NITRITE NEGATIVE  LEUKOCYTESUR NEGATIVE       Imaging: Dg Chest Port 1 View  Result Date: 02/13/2018 CLINICAL DATA:  Congestive heart failure. EXAM: PORTABLE CHEST 1 VIEW COMPARISON:  February 11, 2018 FINDINGS: Stable right PICC line. Mild edema persists, improved in the interval. More focal opacity in the right base is likely atelectasis. Stable cardiomegaly. No other change. IMPRESSION: 1. Cardiomegaly and persistent but improving edema. Probable atelectasis in the right base. Electronically Signed   By: Dorise Bullion III M.D   On: 02/13/2018 09:45     Medications:   . sodium chloride    . ceFEPime (MAXIPIME) IV Stopped (02/12/18 1636)  . furosemide (LASIX) infusion 10 mg/hr (02/13/18 0200)   . budesonide (PULMICORT) nebulizer solution  0.5 mg Nebulization BID  . cholecalciferol  2,000 Units Oral Daily  . enoxaparin (LOVENOX) injection  30 mg Subcutaneous Q24H  . hydroxychloroquine  200 mg Oral Daily  . letrozole  2.5 mg Oral Daily  . levalbuterol  0.63 mg Nebulization Q6H  . magnesium oxide  400 mg Oral Daily  . mouth rinse  15 mL Mouth Rinse BID  . methylPREDNISolone (SOLU-MEDROL) injection  40 mg Intravenous Q12H  . metoprolol succinate  50 mg Oral Daily  . multivitamin with minerals  1 tablet Oral Daily  . mycophenolate  500 mg Oral BID  . patiromer  16.8 g Oral Daily  . polyethylene glycol  17 g Oral Daily  . protein supplement shake  11 oz Oral BID BM   sodium chloride, acetaminophen, magnesium hydroxide, metoprolol tartrate, ondansetron (ZOFRAN) IV, sodium chloride flush, triamcinolone cream  Assessment/ Plan:  [name] is a 52 y.o. black female SLE glomerulonephritis WHO class V (membranous) biopsy on 12/03/2017, hypertension, systolic congestive heart failure, anemia, vitamin D deficiency, breast cancer status post left mastectomy, chemotherapy and radiation who presents as a follow up patient.  1. Acute Respiratory Failure requiring noninvasive ventilation. Respiratory failure seems secondary to  pulmonary edema and acute exacerbation of systolic/diastolic congestive heart failure. EF 25-30% on echocardiogram 4/24 - IV furosemide gtt for one more day.  - Stress dose  steroids.  - fluid restriction  2. Acute renal failure with hyperkalemia on Lupus Nephritis: class V membranous. with proteinuria, hematuria and ANA positive. History of normal compliments. 1.45 grams of proteinuria Creatinine elevated. BUN elevation could be due to stress dose steroids.  - Continue mycophenolate. Stress dose steroids.  - Holding losartan - Continue Veltassa   3. Anemia: beta thalassemia. Hemoglobin 7.4  4. Hypertension: history of difficult to control. Diastolic elevated currently.  Holding Losartan due to hyperkalemia and acute renal failure.  - metoprolol  - Furosemide gtt.     LOS: 4 Melanie Holloway 4/28/201910:11 AM

## 2018-02-13 NOTE — Progress Notes (Signed)
Name: Melanie Holloway MRN: [id number] DOB: 1966/01/09    ADMISSION DATE:  02/09/2018 CONSULTATION DATE: 02/10/2018  REFERRING MD : Dr. Jodell Cipro   CHIEF COMPLAINT: Shortness of Breath   BRIEF PATIENT DESCRIPTION:  52 yo female admitted to telemetry unit with acute on chronic hypoxic respiratory failure secondary to CHF exacerbation and multifocal pneumonia required transfer to stepdown unit on 04/25 due to worsening respiratory failure requiring Bipap.  SIGNIFICANT EVENTS  04/24-Pt admitted to telemetry unit  04/25-Pt transferred to stepdown unit with worsening respiratory failure requiring Bipap  04/26 HFO, Lasix drip 4/28 [place] 2 liters [place]  STUDIES:  Echo 04/24>>  HISTORY OF PRESENT ILLNESS:   This is a 52 yo female with a PMH of Vitamin D Deficiency, Obesity, Lupus Nephritis, HTN, CKD, CHF, Left Breast Cancer (dx 12/2007), and Anemia.  She presented to [facility] ER 04/24 with acute onset of shortness of breath.  She also endorsed chronic bilateral lower extremity edema although worse than normal.  She initially went to Urgent Care on 04/24 due to symptoms and received a nebulized treatment for wheezing, O2 sats 89% to 97%. Urgent Care NP recommended pt proceed to emergency room for additional treatment.  In the ER CXR revealed pulmonary edema and possible infiltrates, therefore she received 40 mg iv lasix.  She was subsequently admitted to the telemetry unit by hospitalist team for further workup and treatment.  On 04/25 pt developed worsening respiratory failure requiring transfer to stepdown unit for Bipap.     REVIEW OF SYSTEMS:   Patient complains of shortness of breath, denied chest pain  SUBJECTIVE:  Reports that she feels much better, no longer short of breath.  VITAL SIGNS: Temp:  [97.3 F (36.3 C)-98.3 F (36.8 C)] 97.9 F (36.6 C) (04/28 0715) Pulse Rate:  [80-90] 83 (04/28 1200) Resp:  [16-34] 29 (04/28 1200) BP: (104-163)/(62-105) 163/101 (04/28 1342) SpO2:  [100 %] 100  % (04/28 1200) FiO2 (%):  [36 %] 36 % (04/28 0726) Weight:  [160 lb 0.9 oz (72.6 kg)] 160 lb 0.9 oz (72.6 kg) (04/28 0301)  PHYSICAL EXAMINATION: General: comfortable on [place] oxygen Neuro: alert and oriented, follows commands  HEENT: supple, JVD present  Cardiovascular: sinus tach, no R/G Lungs: diffuse crackles throughout Abdomen: +BS x4, obese, soft, non tender, non distended  Musculoskeletal: 2+ bilateral lower extremity pitting edema  Skin: intact no rashes or lesions   Recent Labs  Lab 02/11/18 0409  02/12/18 0343 02/12/18 1019 02/13/18 0415  NA 136  --  133*  --  132*  K 5.7*   < > 5.8* 4.9 5.5*  CL 106  --  102  --  101  CO2 20*  --  20*  --  20*  BUN 62*  --  80*  --  96*  CREATININE 2.25*  --  3.04*  --  3.37*  GLUCOSE 133*  --  153*  --  190*   < > = values in this interval not displayed.   Recent Labs  Lab 02/10/18 2310 02/12/18 0343 02/13/18 0438  HGB 8.0* 7.7* 7.4*  HCT 24.0* 23.0* 22.1*  WBC 6.1 5.1 4.4  PLT 286 268 246   Dg Chest Port 1 View  Result Date: 02/13/2018 CLINICAL DATA:  Congestive heart failure. EXAM: PORTABLE CHEST 1 VIEW COMPARISON:  February 11, 2018 FINDINGS: Stable right PICC line. Mild edema persists, improved in the interval. More focal opacity in the right base is likely atelectasis. Stable cardiomegaly. No other change. IMPRESSION: 1. Cardiomegaly  and persistent but improving edema. Probable atelectasis in the right base. Electronically Signed   By: Dorise Bullion III M.D   On: 02/13/2018 09:45    ASSESSMENT / PLAN: Acute on chronic hypoxic respiratory failure secondary to acute systolic and diastolic dysfunction CHF exacerbation   Elevated troponin likely secondary to demand ischemia in setting of respiratory failure Acute Renal Failure with oliguria with slow improvement on Lasix drip, creatinine continues to trend upward Hyperkalemia on Veltassa Severe Cardiomyopathy Valvular heart dz- mod AR, mod MR Anemia without obvious acute  blood loss  Hx: Obesity, Lupus Nephritis, and HTN Essential Hypertension P: Wean Oxygen as able Scheduled and prn bronchodilator therapy Continue on IV steroids Continue on IV lasix drip per nephrology team Continuous telemetry monitoring  Trend Potassium, may need to D/C beta blocker if K+ still labile Trend WBC and monitor fever curve Trend Renal panel  Monitor UOP VTE px: subq lovenox Trend CBC Monitor for s/sx of bleeding and transfuse for hgb <7 On  empirical ABX for underlying infection since patient is immune compromise HIGH RISK FOR INTUBATION   Cammie Sickle, MD  [place] Pager [phone] (please enter 7 digits)

## 2018-02-13 NOTE — Progress Notes (Signed)
[facility] Cardiology  SUBJECTIVE: Patient laying in bed, appears to be improving clinically, denies chest pain   Vitals:   02/13/18 0715 02/13/18 0811 02/13/18 0908 02/13/18 1000  BP:  (!) 129/97 104/72 (!) 127/98  Pulse:   83 84  Resp:  20  (!) 22  Temp: 97.9 F (36.6 C)     TempSrc: Oral     SpO2:    100%  Weight:      Height:         Intake/Output Summary (Last 24 hours) at 02/13/2018 1122 Last data filed at 02/13/2018 1009 Gross per 24 hour  Intake 520 ml  Output 620 ml  Net -100 ml      PHYSICAL EXAM  General: Well developed, well nourished, in no acute distress HEENT:  Normocephalic and atramatic Neck:  No JVD.  Lungs: Clear bilaterally to auscultation and percussion. Heart: HRRR . Normal S1 and S2 without gallops or murmurs.  Abdomen: Bowel sounds are positive, abdomen soft and non-tender  Msk:  Back normal, normal gait. Normal strength and tone for age. Extremities: No clubbing, cyanosis or edema.   Neuro: Alert and oriented X 3. Psych:  Good affect, responds appropriately   LABS: Basic Metabolic Panel: Recent Labs    02/12/18 0343 02/12/18 1019 02/13/18 0415 02/13/18 0438  NA 133*  --  132*  --   K 5.8* 4.9 5.5*  --   CL 102  --  101  --   CO2 20*  --  20*  --   GLUCOSE 153*  --  190*  --   BUN 80*  --  96*  --   CREATININE 3.04*  --  3.37*  --   CALCIUM 8.6*  --  8.5*  --   MG  --   --   --  2.3  PHOS 7.4*  --  7.8*  --    Liver Function Tests: Recent Labs    02/11/18 0409 02/12/18 0343 02/13/18 0415  AST 34  --   --   ALT 21  --   --   ALKPHOS 72  --   --   BILITOT 0.7  --   --   PROT 6.3*  --   --   ALBUMIN 2.6* 2.5* 2.6*   No results for input(s): LIPASE, AMYLASE in the last 72 hours. CBC: Recent Labs    02/10/18 2310 02/12/18 0343 02/13/18 0438  WBC 6.1 5.1 4.4  NEUTROABS 4.6 3.4  --   HGB 8.0* 7.7* 7.4*  HCT 24.0* 23.0* 22.1*  MCV 89.1 89.2 89.4  PLT 286 268 246   Cardiac Enzymes: Recent Labs    02/10/18 1358  02/11/18 0409  TROPONINI 0.33* 0.26*   BNP: Invalid input(s): POCBNP D-Dimer: No results for input(s): DDIMER in the last 72 hours. Hemoglobin A1C: No results for input(s): HGBA1C in the last 72 hours. Fasting Lipid Panel: No results for input(s): CHOL, HDL, LDLCALC, TRIG, CHOLHDL, LDLDIRECT in the last 72 hours. Thyroid Function Tests: No results for input(s): TSH, T4TOTAL, T3FREE, THYROIDAB in the last 72 hours.  Invalid input(s): FREET3 Anemia Panel: No results for input(s): VITAMINB12, FOLATE, FERRITIN, TIBC, IRON, RETICCTPCT in the last 72 hours.  Dg Chest Port 1 View  Result Date: 02/13/2018 CLINICAL DATA:  Congestive heart failure. EXAM: PORTABLE CHEST 1 VIEW COMPARISON:  February 11, 2018 FINDINGS: Stable right PICC line. Mild edema persists, improved in the interval. More focal opacity in the right base is likely atelectasis. Stable cardiomegaly. No  other change. IMPRESSION: 1. Cardiomegaly and persistent but improving edema. Probable atelectasis in the right base. Electronically Signed   By: Dorise Bullion III M.D   On: 02/13/2018 09:45     Echo LVEF 25 to 30%  TELEMETRY: Normal sinus rhythm with right bundle branch block:  ASSESSMENT AND PLAN:  Active Problems:   CHF (congestive heart failure) ([place])    1.  Acute on chronic systolic congestive heart failure, LVEF 25 to 30%, improving after diuresis 2.  Acute renal failure with hyperkalemia, with underlying lupus nephritis 3.  Borderline elevated troponin, in the absence of chest pain or new ECG changes, likely demand supply ischemia, not due to acute coronary syndrome  Recommendations  1.  Agree with overall current therapy 2.  Continue furosemide drip 3.  Defer further cardiac diagnostics at this time   Isaias Cowman, MD, PhD, [facility] 02/13/2018 11:22 AM

## 2018-02-13 NOTE — Progress Notes (Signed)
[place] [facility] [place] NAME: Melanie Holloway    MR#:  [id number]  DATE OF BIRTH:  20-Oct-1965  SUBJECTIVE: Less shortness of breath, appears comfortable.  On 3 L of oxygen.  Saturation 100%.  Afebrile.  CHIEF COMPLAINT:   REVIEW OF SYSTEMS:  CONSTITUTIONAL: No fever, fatigue or weakness.  EYES: No blurred or double vision.  EARS, NOSE, AND THROAT: No tinnitus or ear pain.  RESPIRATORY: On nasal cannula, decreased shortness of breath.Marland Kitchen  CARDIOVASCULAR: No chest pain, orthopnea, edema.  GASTROINTESTINAL: No nausea, vomiting, diarrhea or abdominal pain.  GENITOURINARY: No dysuria, hematuria.  ENDOCRINE: No polyuria, nocturia,  HEMATOLOGY: No anemia, easy bruising or bleeding SKIN: No rash or lesion.  Bilateral lower extremity edema MUSCULOSKELETAL: No joint pain or arthritis.   NEUROLOGIC: No tingling, numbness, weakness.  PSYCHIATRY: No anxiety or depression.   DRUG ALLERGIES:  No Known Allergies  VITALS:  Blood pressure (!) 127/98, pulse 84, temperature 97.9 F (36.6 C), temperature source Oral, resp. rate (!) 22, height 4\' 11"  (1.499 m), weight 72.6 kg (160 lb 0.9 oz), SpO2 100 %.  PHYSICAL EXAMINATION:  GENERAL:  52 y.o.-year-old patient lying in the bed with no acute distress.  EYES: Pupils equal, round, reactive to light and accommodation. No scleral icterus. Extraocular muscles intact.  HEENT: Head atraumatic, normocephalic. Oropharynx and nasopharynx clear.  NECK:  Supple, no jugular venous distention. No thyroid enlargement, no tenderness.  LUNGS: Clear to auscultation, no wheezing, no rales.Marland Kitchen  CARDIOVASCULAR: S1, S2 normal. No murmurs, rubs, or gallops.  ABDOMEN: Soft, nontender, nondistended. Bowel sounds present EXTREMITIES patient has 2+ pitting edema bilaterally nEUROLOGIC: Cranial nerves II through XII are intact. Muscle strength 5/5 in all extremities. Sensation intact. Gait not checked.  PSYCHIATRIC: The patient is  alert and oriented x 3.  SKIN: No obvious rash, lesion, or ulcer.    LABORATORY PANEL:   CBC Recent Labs  Lab 02/13/18 0438  WBC 4.4  HGB 7.4*  HCT 22.1*  PLT 246   ------------------------------------------------------------------------------------------------------------------  Chemistries  Recent Labs  Lab 02/11/18 0409  02/13/18 0415 02/13/18 0438  NA 136   < > 132*  --   K 5.7*   < > 5.5*  --   CL 106   < > 101  --   CO2 20*   < > 20*  --   GLUCOSE 133*   < > 190*  --   BUN 62*   < > 96*  --   CREATININE 2.25*   < > 3.37*  --   CALCIUM 8.2*   < > 8.5*  --   MG  --   --   --  2.3  AST 34  --   --   --   ALT 21  --   --   --   ALKPHOS 72  --   --   --   BILITOT 0.7  --   --   --    < > = values in this interval not displayed.   ------------------------------------------------------------------------------------------------------------------  Cardiac Enzymes Recent Labs  Lab 02/11/18 0409  TROPONINI 0.26*   ------------------------------------------------------------------------------------------------------------------  RADIOLOGY:  Dg Chest Port 1 View  Result Date: 02/13/2018 CLINICAL DATA:  Congestive heart failure. EXAM: PORTABLE CHEST 1 VIEW COMPARISON:  February 11, 2018 FINDINGS: Stable right PICC line. Mild edema persists, improved in the interval. More focal opacity in the right base is likely atelectasis. Stable cardiomegaly. No other change. IMPRESSION: 1. Cardiomegaly and  persistent but improving edema. Probable atelectasis in the right base. Electronically Signed   By: Dorise Bullion III M.D   On: 02/13/2018 09:45    EKG:   Orders placed or performed during the hospital encounter of 02/09/18  . ED EKG  . ED EKG  . EKG 12-Lead  . EKG 12-Lead  . EKG 12-Lead  . EKG 12-Lead  . EKG 12-Lead  . EKG 12-Lead    ASSESSMENT AND PLAN:    52 year old female patient with history of lupus erythematosus, congestive heart failure, hypertension, breast  cancer presented to demonstrate with difficulty breathing.  Acute respiratory failure with hypoxia secondary to acute systolic CHF exacerbation with pulmonary edema possible underlying lupus pneumonitis  less oxygen requirement now, clinically improving slowly.  Acute systolic CHF exacerbation   off BiPAP, EF is 25 to 30% BNP 1940 Continue Lasix drip, echocardiogram showed EF 20 to 25%.  Cardiology is following. Hyperkalemia with potassium 5.4 Patient is started on Lasix drip, valtessa. [place] nephrology, cardiology following the patient.   Lupus nephritis with proteinuria with history of Systemic lupus erythematosus Supportive care, continue home medication Plaquenil, stress dose steroids, hold losartan.  Patient has hematuria, proteinuria, ANA positive. Nephrology is following Patient is getting stress dose steroids  Beta thalassemia anemia monitor hemoglobin and hematocrit today hemoglobin is at 8.6   DVT prophylaxis with subcu Lovenox 40 mg daily  high procalcitonin, possible pneumonia on the right side: Patient is on cefepime. Possible transfer to telemetry.   All the records are reviewed and case discussed with Care Management/Social Workerr. Management plans discussed with the patient, family and they are in agreement.  CODE STATUS: fc   TOTAL TIME TAKING CARE OF THIS PATIENT: 35 minutes.   POSSIBLE D/C IN ? DAYS, DEPENDING ON CLINICAL CONDITION.  Note: This dictation was prepared with Dragon dictation along with smaller phrase technology. Any transcriptional errors that result from this process are unintentional.   Epifanio Lesches M.D on 02/13/2018 at 1:00 PM  Between 7am to 6pm - Pager - [phone] After 6pm go to [email or web address] - password [email or web address] [facility]  [facility]  Office  [phone]  CC: Primary care physician; Ria Bush, MD

## 2018-02-14 LAB — RENAL FUNCTION PANEL
ALBUMIN: 2.6 g/dL — AB (ref 3.5–5.0)
Anion gap: 10 (ref 5–15)
BUN: 96 mg/dL — AB (ref 6–20)
CO2: 20 mmol/L — ABNORMAL LOW (ref 22–32)
CREATININE: 3.09 mg/dL — AB (ref 0.44–1.00)
Calcium: 8.8 mg/dL — ABNORMAL LOW (ref 8.9–10.3)
Chloride: 100 mmol/L — ABNORMAL LOW (ref 101–111)
GFR calc non Af Amer: 16 mL/min — ABNORMAL LOW (ref >60)
GFR, EST AFRICAN AMERICAN: 19 mL/min — AB (ref >60)
Glucose, Bld: 168 mg/dL — ABNORMAL HIGH (ref 65–99)
PHOSPHORUS: 6.9 mg/dL — AB (ref 2.5–4.6)
POTASSIUM: 4.7 mmol/L (ref 3.5–5.1)
Sodium: 130 mmol/L — ABNORMAL LOW (ref 135–145)

## 2018-02-14 LAB — BASIC METABOLIC PANEL
Anion gap: 11 (ref 5–15)
BUN: 101 mg/dL — AB (ref 6–20)
CO2: 18 mmol/L — ABNORMAL LOW (ref 22–32)
CREATININE: 3.12 mg/dL — AB (ref 0.44–1.00)
Calcium: 8.7 mg/dL — ABNORMAL LOW (ref 8.9–10.3)
Chloride: 101 mmol/L (ref 101–111)
GFR calc non Af Amer: 16 mL/min — ABNORMAL LOW (ref >60)
GFR, EST AFRICAN AMERICAN: 18 mL/min — AB (ref >60)
Glucose, Bld: 161 mg/dL — ABNORMAL HIGH (ref 65–99)
POTASSIUM: 4.7 mmol/L (ref 3.5–5.1)
Sodium: 130 mmol/L — ABNORMAL LOW (ref 135–145)

## 2018-02-14 LAB — PROCALCITONIN: Procalcitonin: 3.06 ng/mL

## 2018-02-14 LAB — MAGNESIUM: MAGNESIUM: 2.3 mg/dL (ref 1.7–2.4)

## 2018-02-14 LAB — PHOSPHORUS: Phosphorus: 6.6 mg/dL — ABNORMAL HIGH (ref 2.5–4.6)

## 2018-02-14 LAB — GLUCOSE, CAPILLARY: GLUCOSE-CAPILLARY: 179 mg/dL — AB (ref 65–99)

## 2018-02-14 MED ORDER — SODIUM CHLORIDE 0.9% FLUSH
10.0000 mL | Freq: Two times a day (BID) | INTRAVENOUS | Status: DC
  Administered 2018-02-14: 30 mL
  Administered 2018-02-15: 10 mL

## 2018-02-14 MED ORDER — ALTEPLASE 2 MG IJ SOLR
2.0000 mg | Freq: Once | INTRAMUSCULAR | Status: AC
  Administered 2018-02-14: 2 mg
  Filled 2018-02-14: qty 2

## 2018-02-14 MED ORDER — HYDRALAZINE HCL 25 MG PO TABS
25.0000 mg | ORAL_TABLET | Freq: Three times a day (TID) | ORAL | Status: DC
  Administered 2018-02-14 – 2018-02-15 (×3): 25 mg via ORAL
  Filled 2018-02-14 (×3): qty 1

## 2018-02-14 NOTE — Progress Notes (Signed)
Name: Melanie Holloway MRN: [id number] DOB: 1965/11/10     CONSULTATION DATE: 02/09/2018  Subjective & Objective:  Feels betters, tolerating [place] and off Lasix gtt . Because of worsening renal function.  PAST MEDICAL HISTORY :   has a past medical history of Abnormal Pap smear (~2005), Anemia, Breast cancer, left ([place]) (12/2007), CHF (congestive heart failure) ([place]), Chronic kidney disease, Closed nondisplaced fracture of fifth metatarsal bone of right foot (08/07/2016), Full dentures, Hypertension, Lupus nephritis ([place]), Obesity, Proteinuria (11/28/2015), and Vitamin D deficiency.  has a past surgical history that includes Mastectomy (2009); Ankle surgery (1987); Tubal ligation (2000); and Flexible bronchoscopy (N/A, 11/30/2017). Prior to Admission medications   Medication Sig Start Date End Date Taking? Authorizing Provider  Cholecalciferol (VITAMIN D) 2000 UNITS CAPS Take 1 capsule (2,000 Units total) by mouth daily. 09/29/12  Yes Ria Bush, MD  furosemide (LASIX) 40 MG tablet Take 2 tablets (80 mg total) by mouth daily. 01/12/18  Yes Hackney, Otila Kluver A, FNP  hydroxychloroquine (PLAQUENIL) 200 MG tablet Take 1 tablet (200 mg total) by mouth daily. 12/08/17 12/08/18 Yes Salary, Avel Peace, MD  letrozole ([place]) 2.5 MG tablet TAKE 1 TABLET BY MOUTH EVERY DAY 12/15/17  Yes Magrinat, Virgie Dad, MD  losartan (COZAAR) 50 MG tablet TAKE 1 TABLET BY MOUTH EVERY DAY 02/02/18  Yes Ria Bush, MD  Magnesium 400 MG CAPS Take 400 mg by mouth daily. 01/12/18  Yes Darylene Price A, FNP  metoprolol succinate (TOPROL XL) 25 MG 24 hr tablet Take 1 tablet (25 mg total) by mouth daily. 01/12/18  Yes Alisa Graff, FNP  Multiple Vitamin (MULTIVITAMIN WITH MINERALS) TABS tablet Take 1 tablet by mouth daily. 11/23/17  Yes Gouru, Illene Silver, MD  mycophenolate (CELLCEPT) 250 MG capsule Take 2 capsules (500 mg total) by mouth 2 (two) times daily. 12/08/17  Yes Salary, Avel Peace, MD  predniSONE (DELTASONE) 10 MG tablet Take 1  tablet (10 mg total) by mouth daily. 01/27/18 01/27/19 Yes Ria Bush, MD  triamcinolone cream (KENALOG) 0.1 % Apply 1 application topically 2 (two) times daily. Apply to [place]. 03/29/17 03/29/18 Yes Ria Bush, MD  acetaminophen (TYLENOL) 325 MG tablet Take 2 tablets (650 mg total) by mouth every 4 (four) hours as needed for headache or mild pain. 11/23/17   Nicholes Mango, MD  protein supplement shake (PREMIER PROTEIN) LIQD Take 325 mLs (11 oz total) by mouth 2 (two) times daily between meals. Patient not taking: Reported on 02/09/2018 11/23/17   Nicholes Mango, MD   No Known Allergies  FAMILY HISTORY:  family history includes Cancer in her cousin and paternal grandmother; Diabetes in her father. SOCIAL HISTORY:  reports that she has never smoked. She has never used smokeless tobacco. She reports that she does not drink alcohol or use drugs.  REVIEW OF SYSTEMS:   Unable to obtain due to critical illness   VITAL SIGNS: Temp:  [97.5 F (36.4 C)-98.9 F (37.2 C)] 97.8 F (36.6 C) (04/29 1200) Pulse Rate:  [77-86] 84 (04/29 1200) Resp:  [20-34] 20 (04/29 1200) BP: (150-177)/(81-104) 161/89 (04/29 1200) SpO2:  [92 %-100 %] 95 % (04/29 1200) Weight:  [73.6 kg (162 lb 4.1 oz)] 73.6 kg (162 lb 4.1 oz) (04/29 0500)  Physical Examination:  A + O and no acute neuro deficits [place], no distress, able to talk in full sentences. BEAE and no rales S1 & S2 audible and no murmur Benign abdomen with normal peristalses    ASSESSMENT / PLAN:  Acute respiratory failure.  Improved and tolerating [place] -Monitor work of breathing and O2 sat.  CHF and pulmonary edema with history of valvular heart disease and cardiomyopathy with LV EF 25-30% -Optimize diuresis to improve lung compliance   AKI with lupus nephritis -On Mycophenolate + Steroids + Plaquenil and management as per renal.  Atelectasis and pneumonia with infective etiology can not be ruled out -Empiric Cefep and consider de-escalation with  clinical improvement if no SIRS.  Anemia with Beta Thalassemia -Keep HB > 7 gm/dl.  HTN -Optimize antihypertensives and monitor hemodynamics.  Full code  DVT & GI prophylaxis. Continue with supportive care.

## 2018-02-14 NOTE — Progress Notes (Signed)
[facility]  ROUNDING NOTE   Subjective:   She is doing better Currently on room air Leg edema has improved quite a bit Serum creatinine 3.12.  Yesterday was 3.37 BUN is critically elevated at 101   Objective:  Vital signs in last 24 hours:  Temp:  [97.5 F (36.4 C)-98.9 F (37.2 C)] 97.6 F (36.4 C) (04/29 0800) Pulse Rate:  [77-86] 86 (04/29 0900) Resp:  [20-34] 33 (04/29 0900) BP: (116-168)/(81-104) 162/97 (04/29 0900) SpO2:  [89 %-100 %] 92 % (04/29 0900) Weight:  [162 lb 4.1 oz (73.6 kg)] 162 lb 4.1 oz (73.6 kg) (04/29 0500)  Weight change: 2 lb 3.3 oz (1 kg) Filed Weights   02/12/18 0349 02/13/18 0301 02/14/18 0500  Weight: 154 lb 5.2 oz (70 kg) 160 lb 0.9 oz (72.6 kg) 162 lb 4.1 oz (73.6 kg)    Intake/Output: I/O last 3 completed shifts: In: 007 [P.O.:240; I.V.:355] Out: 1330 [Urine:1330]   Intake/Output this shift:  Total I/O In: 257 [P.O.:237; I.V.:20] Out: 45 [Urine:60]  Physical Exam: General:  Sitting up in the chair, well-appearing today  Head: N/C  Eyes: Anicteric, no thrush  Neck: Supple,  Lungs:   Room air, clear to auscultation bilaterally  Heart: regular  Abdomen:  Soft, nontender, obese  Extremities:  Trace to 1+ peripheral edema.  Neurologic: Following commands, alert and oriented  Skin: No lesions   GU Foley with clear yellow urine    Basic Metabolic Panel: Recent Labs  Lab 02/10/18 2310 02/11/18 0409  02/11/18 1956 02/12/18 0343 02/12/18 1019 02/13/18 0415 02/13/18 0438 02/14/18 0424  NA 138 136  --   --  133*  --  132*  --  130*  130*  K 5.2* 5.7*   < > 5.9* 5.8* 4.9 5.5*  --  4.7  4.7  CL 105 106  --   --  102  --  101  --  101  100*  CO2 22 20*  --   --  20*  --  20*  --  18*  20*  GLUCOSE 160* 133*  --   --  153*  --  190*  --  161*  168*  BUN 56* 62*  --   --  80*  --  96*  --  101*  96*  CREATININE 1.89* 2.25*  --   --  3.04*  --  3.37*  --  3.12*  3.09*  CALCIUM 8.3* 8.2*  --   --  8.6*  --  8.5*   --  8.7*  8.8*  MG  --   --   --   --   --   --   --  2.3 2.3  PHOS  --   --   --   --  7.4*  --  7.8*  --  6.6*  6.9*   < > = values in this interval not displayed.    Liver Function Tests: Recent Labs  Lab 02/09/18 1332 02/11/18 0409 02/12/18 0343 02/13/18 0415 02/14/18 0424  AST 35 34  --   --   --   ALT 23 21  --   --   --   ALKPHOS 86 72  --   --   --   BILITOT 0.4 0.7  --   --   --   PROT 6.3* 6.3*  --   --   --   ALBUMIN 2.7* 2.6* 2.5* 2.6* 2.6*   No results for input(s): LIPASE,  AMYLASE in the last 168 hours. No results for input(s): AMMONIA in the last 168 hours.  CBC: Recent Labs  Lab 02/09/18 1332 02/10/18 2310 02/12/18 0343 02/13/18 0438  WBC 6.3 6.1 5.1 4.4  NEUTROABS 4.9 4.6 3.4  --   HGB 8.6* 8.0* 7.7* 7.4*  HCT 25.8* 24.0* 23.0* 22.1*  MCV 89.7 89.1 89.2 89.4  PLT 303 286 268 246    Cardiac Enzymes: Recent Labs  Lab 02/09/18 1332 02/09/18 1853 02/10/18 1358 02/11/18 0409  TROPONINI 0.05* 0.10* 0.33* 0.26*    BNP: Invalid input(s): POCBNP  CBG: Recent Labs  Lab 02/10/18 0347 02/11/18 0633 02/11/18 1519 02/11/18 2157 02/11/18 2302  GLUCAP 85 131* 135* 326* 190*    Microbiology: Results for orders placed or performed during the hospital encounter of 02/09/18  MRSA PCR Screening     Status: None   Collection Time: 02/10/18  4:28 AM  Result Value Ref Range Status   MRSA by PCR NEGATIVE NEGATIVE Final    Comment:        The GeneXpert MRSA Assay (FDA approved for NASAL specimens only), is one component of a comprehensive MRSA colonization surveillance program. It is not intended to diagnose MRSA infection nor to guide or monitor treatment for MRSA infections. Performed at [facility], [address] [place],  [address]     Coagulation Studies: No results for input(s): LABPROT, INR in the last 72 hours.  Urinalysis: No results for input(s): COLORURINE, LABSPEC, PHURINE, GLUCOSEU, HGBUR, BILIRUBINUR,  KETONESUR, PROTEINUR, UROBILINOGEN, NITRITE, LEUKOCYTESUR in the last 72 hours.  Invalid input(s): APPERANCEUR    Imaging: Dg Chest Port 1 View  Result Date: 02/13/2018 CLINICAL DATA:  Congestive heart failure. EXAM: PORTABLE CHEST 1 VIEW COMPARISON:  February 11, 2018 FINDINGS: Stable right PICC line. Mild edema persists, improved in the interval. More focal opacity in the right base is likely atelectasis. Stable cardiomegaly. No other change. IMPRESSION: 1. Cardiomegaly and persistent but improving edema. Probable atelectasis in the right base. Electronically Signed   By: Dorise Bullion III M.D   On: 02/13/2018 09:45     Medications:   . sodium chloride    . ceFEPime (MAXIPIME) IV Stopped (02/13/18 1335)  . furosemide (LASIX) infusion 10 mg/hr (02/14/18 0800)   . budesonide (PULMICORT) nebulizer solution  0.5 mg Nebulization BID  . cholecalciferol  2,000 Units Oral Daily  . enoxaparin (LOVENOX) injection  30 mg Subcutaneous Q24H  . hydroxychloroquine  200 mg Oral Daily  . letrozole  2.5 mg Oral Daily  . levalbuterol  0.63 mg Nebulization TID  . magnesium oxide  400 mg Oral Daily  . mouth rinse  15 mL Mouth Rinse BID  . methylPREDNISolone (SOLU-MEDROL) injection  40 mg Intravenous Q12H  . metoprolol succinate  50 mg Oral Daily  . multivitamin with minerals  1 tablet Oral Daily  . mycophenolate  500 mg Oral BID  . pantoprazole  40 mg Oral QAC breakfast  . patiromer  16.8 g Oral Daily  . polyethylene glycol  17 g Oral Daily   sodium chloride, acetaminophen, hydrALAZINE, magnesium hydroxide, metoprolol tartrate, ondansetron (ZOFRAN) IV, sodium chloride flush, triamcinolone cream  Assessment/ Plan:  [name] is a 52 y.o. black female SLE glomerulonephritis WHO class V (membranous) biopsy on 12/03/2017, hypertension, systolic congestive heart failure, anemia, vitamin D deficiency, breast cancer status post left mastectomy, chemotherapy and radiation who presents as a follow up  patient.  1. Acute Respiratory Failure requiring noninvasive ventilation. Respiratory failure seems  secondary to pulmonary edema and acute exacerbation of systolic/diastolic congestive heart failure. EF 25-30% on echocardiogram 4/24 - May d/c furosemide drip. Will transition to oral diuretics tomorrow - high BUN likely due to stress dose steroids.  LE edema has improved   2. Acute renal failure with hyperkalemia on Lupus Nephritis: class V membranous. with proteinuria, hematuria and ANA positive. History of normal compliments. 1.45 grams of proteinuria Creatinine elevated. BUN elevation could be due to stress dose steroids.  - Continue mycophenolate. Stress dose steroids.  - Holding losartan - Continue Veltassa   3. Anemia: beta thalassemia. Hemoglobin 7.4  4. Hypertension: history of difficult to control. Diastolic elevated currently.  Holding Losartan due to hyperkalemia and acute renal failure.  - metoprolol  - consider adding clonidine temporarily until losartan can be restarted     LOS: 5 Melanie Holloway 4/29/201910:06 AM

## 2018-02-14 NOTE — Care Management (Addendum)
RNCM consult received and will follow. It is noted that patient had home health through [facility] earlier this year.  I am checking to see if they are still following patient as outpatient. Per report from [facility], home health services was closed January 19 2018. RNCm spoke with patient and she would like to have [facility] come back in.  I have updated Jerm[facility] with [facility] of this need.

## 2018-02-14 NOTE — Progress Notes (Signed)
[facility] [place] NAME: Melanie Holloway    MR#:  [id number]  DATE OF BIRTH:  1966-10-10  SUBJECTIVE: .,  On room air, saturation is 99 percent on room air appears comfortable,   no chest pain, no shortness of breath.  CHIEF COMPLAINT:   REVIEW OF SYSTEMS:  CONSTITUTIONAL: No fever, fatigue or weakness.  EYES: No blurred or double vision.  EARS, NOSE, AND THROAT: No tinnitus or ear pain.  RESPIRATORY: On nasal cannula, decreased shortness of breath.Marland Kitchen  CARDIOVASCULA.: No chest pain, orthopnea, edema.  GASTROINTESTINAL: No nausea, vomiting, diarrhea or abdominal pain.  GENITOURINARY: No dysuria, hematuria.  ENDOCRINE: No polyuria, nocturia,  HEMATOLOGY: No anemia, easy bruising or bleeding SKIN: No rash or lesion.  Bilateral lower extremity edema improving.  MUSCULOSKELETAL: No joint pain or arthritis.   NEUROLOGIC: No tingling, numbness, weakness.  PSYCHIATRY: No anxiety or depression.   DRUG ALLERGIES:  No Known Allergies  VITALS:  Blood pressure 134/89, pulse 80, temperature 98.7 F (37.1 C), temperature source Oral, resp. rate (!) 26, height 4\' 11"  (1.499 m), weight 73.6 kg (162 lb 4.1 oz), SpO2 92 %.  PHYSICAL EXAMINATION:  GENERAL:  52 y.o.-year-old patient lying in the bed with no acute distress.  EYES: Pupils equal, round, reactive to light and accommodation. No scleral icterus. Extraocular muscles intact.  HEENT: Head atraumatic, normocephalic. Oropharynx and nasopharynx clear.  NECK:  Supple, no jugular venous distention. No thyroid enlargement, no tenderness.  LUNGS: Clear to auscultation, no wheezing, no rales.Marland Kitchen  CARDIOVASCULAR: S1, S2 normal. No murmurs, rubs, or gallops.  ABDOMEN: Soft, nontender, nondistended. Bowel sounds present EXTREMITIES patient has 2+ pitting edema bilaterally nEUROLOGIC: Cranial nerves II through XII are intact. Muscle strength 5/5 in all extremities. Sensation intact. Gait not checked.   PSYCHIATRIC: The patient is alert and oriented x 3.  SKIN: No obvious rash, lesion, or ulcer.    LABORATORY PANEL:   CBC Recent Labs  Lab 02/13/18 0438  WBC 4.4  HGB 7.4*  HCT 22.1*  PLT 246   ------------------------------------------------------------------------------------------------------------------  Chemistries  Recent Labs  Lab 02/11/18 0409  02/14/18 0424  NA 136   < > 130*  130*  K 5.7*   < > 4.7  4.7  CL 106   < > 101  100*  CO2 20*   < > 18*  20*  GLUCOSE 133*   < > 161*  168*  BUN 62*   < > 101*  96*  CREATININE 2.25*   < > 3.12*  3.09*  CALCIUM 8.2*   < > 8.7*  8.8*  MG  --    < > 2.3  AST 34  --   --   ALT 21  --   --   ALKPHOS 72  --   --   BILITOT 0.7  --   --    < > = values in this interval not displayed.   ------------------------------------------------------------------------------------------------------------------  Cardiac Enzymes Recent Labs  Lab 02/11/18 0409  TROPONINI 0.26*   ------------------------------------------------------------------------------------------------------------------  RADIOLOGY:  Dg Chest Port 1 View  Result Date: 02/13/2018 CLINICAL DATA:  Congestive heart failure. EXAM: PORTABLE CHEST 1 VIEW COMPARISON:  February 11, 2018 FINDINGS: Stable right PICC line. Mild edema persists, improved in the interval. More focal opacity in the right base is likely atelectasis. Stable cardiomegaly. No other change. IMPRESSION: 1. Cardiomegaly and persistent but improving edema. Probable atelectasis in the right base. Electronically Signed   By: Shanon Brow  Mee Hives M.D   On: 02/13/2018 09:45    EKG:   Orders placed or performed during the hospital encounter of 02/09/18  . ED EKG  . ED EKG  . EKG 12-Lead  . EKG 12-Lead  . EKG 12-Lead  . EKG 12-Lead  . EKG 12-Lead  . EKG 12-Lead    ASSESSMENT AND PLAN:    52 year old female patient with history of lupus erythematosus, congestive heart failure, hypertension,  breast cancer presented to demonstrate with difficulty breathing.  Acute respiratory failure with hypoxia secondary to acute systolic CHF exacerbation with pulmonary edema possible underlying lupus pneumonitis  less oxygen requirement now, clinically improving slowly. Patient is on room air.  Off IV Lasix drip.  Acute systolic CHF exacerbation   off BiPAP, EF is 25 to 30% BNP 1940  Lasix drip was stopped.  , echocardiogram showed EF 20 to 25%.  Cardiology is following.  Hyperkalemia ; improving with Veltassa.  Hold losartan at this time because of hyperkalemia, use clonidine temporarily for blood pressure control. [facility], cardiology following the patient.   Lupus nephritis with proteinuria with history of Systemic lupus erythematosus Supportive care, continue home medication Plaquenil, stress dose steroids, hold losartan.  Patient has hematuria, proteinuria, ANA positive. Nephrology is following Patient is getting stress dose steroids  Beta thalassemia anemia monitor hemoglobin and hematocrit today hemoglobin is at 8.6   DVT prophylaxis with subcu Lovenox 40 mg daily  high procalcitonin, possible pneumonia on the right side: Patient is on cefepime.  Physical therapy recommended home health physical therapy.  Follow-up with cardiology for systolic heart failure.  All the records are reviewed and case discussed with Care Management/Social Workerr. Management plans discussed with the patient, family and they are in agreement.  CODE STATUS: fc   TOTAL TIME TAKING CARE OF THIS PATIENT: 35 minutes.   POSSIBLE D/C IN ? DAYS, DEPENDING ON CLINICAL CONDITION.  Note: This dictation was prepared with Dragon dictation along with smaller phrase technology. Any transcriptional errors that result from this process are unintentional.   Epifanio Lesches M.D on 02/14/2018 at 6:29 PM  Between 7am to 6pm - Pager - [phone] After 6pm go to [email or web address] - password [email or web address]  [facility]  [facility]  Office  [phone]  CC: Primary care physician; Ria Bush, MD

## 2018-02-14 NOTE — Evaluation (Signed)
Physical Therapy Evaluation Patient Details Name: Melanie Holloway MRN: [id number] DOB: 08-Jul-1966 Today's Date: 02/14/2018   History of Present Illness  Pt is a 52 y/o F who presented with SOB.  Pt does not use home O2.  Chest x-ray showed vascular congestion and fluid overload.  Pt admitted with acute respiratory failure requiring BiPAP initially.  Pt's PMH includes breast cancer, CHF, lupus nephritis, L ankle surgery.      Clinical Impression  Pt admitted with above diagnosis. Pt currently with functional limitations due to the deficits listed below (see PT Problem List). [name] currently requires min guard assist for bed mobility, sit<>stand, and short distance ambulation.  Pt fatigues after ambulating 35 ft with RW this session.  She will have 24/7 assist/supervision available from her son at d/c whom she is currently living with. Encouraged pt to ambulate at least 3x/day with nursing staff to improve strength and endurance for return to home at d/c, pt verbalized understanding.  Pt will benefit from skilled PT to increase their independence and safety with mobility to allow discharge to the venue listed below.      Follow Up Recommendations Home health PT;Supervision for mobility/OOB    Equipment Recommendations  None recommended by PT    Recommendations for Other Services       Precautions / Restrictions Precautions Precautions: Fall;Other (comment) Precaution Comments: monitor O2 Restrictions Weight Bearing Restrictions: No      Mobility  Bed Mobility Overal bed mobility: Needs Assistance Bed Mobility: Supine to Sit     Supine to sit: Min guard;HOB elevated     General bed mobility comments: Max increased time and effort but no physical assist or cues needed.   Transfers Overall transfer level: Needs assistance Equipment used: Rolling walker (2 wheeled) Transfers: Sit to/from Stand Sit to Stand: Min guard         General transfer comment: Cues for proper hand  placement as pt initially reaches for RW with BUEs for sit>stand.  Pt demonstrates safe technique for stand>sit.    Ambulation/Gait Ambulation/Gait assistance: Min guard Ambulation Distance (Feet): 35 Feet Assistive device: Rolling walker (2 wheeled) Gait Pattern/deviations: Step-through pattern;Decreased stride length;Trunk flexed Gait velocity: decreased Gait velocity interpretation: <1.8 ft/sec, indicate of risk for recurrent falls General Gait Details: Decreased gait speed but pt remains steady with no instability noted.  Pt fatigues toward end of ambulation.  SpO2 remains in mid-high 90s throughout on RA.   Stairs            Wheelchair Mobility    Modified Rankin (Stroke Patients Only)       Balance Overall balance assessment: Needs assistance Sitting-balance support: No upper extremity supported;Feet supported Sitting balance-Leahy Scale: Fair     Standing balance support: Bilateral upper extremity supported;During functional activity Standing balance-Leahy Scale: Poor Standing balance comment: Pt relies on UE support for static and dynamic activities                             Pertinent Vitals/Pain Pain Assessment: Faces Faces Pain Scale: Hurts little more Pain Location: low back Pain Descriptors / Indicators: Aching;Discomfort Pain Intervention(s): Limited activity within patient's tolerance;Monitored during session;Repositioned    Home Living Family/patient expects to be discharged to:: Private residence Living Arrangements: Children Available Help at Discharge: Family;Available 24 hours/day Type of Home: Mobile home Home Access: Stairs to enter Entrance Stairs-Rails: Left Entrance Stairs-Number of Steps: 7 in back, 10 in front Home Layout: One  level Home Equipment: Bedside commode;Cane - single point;Walker - 2 wheels Additional Comments: Information is of son's home layout as pt currently living with son and is planning do continue this at  d/c    Prior Function Level of Independence: Independent with assistive device(s)         Comments: Pt no longer driving, her husband and son do this.  Pt independent with bathing, dressing.  Assists with meal prep but unable to do independently.  Denies any falls in the past 6 months.  Recently completed HHPT.  Pt ambulates with RW at all times.       Hand Dominance        Extremity/Trunk Assessment   Upper Extremity Assessment Upper Extremity Assessment: (BUE strength grossly 3+/5)    Lower Extremity Assessment Lower Extremity Assessment: (BLE strength grossly 4-/5)    Cervical / Trunk Assessment Cervical / Trunk Assessment: Kyphotic;Other exceptions Cervical / Trunk Exceptions: pt reports low back pain  Communication   Communication: No difficulties  Cognition Arousal/Alertness: Awake/alert Behavior During Therapy: Flat affect Overall Cognitive Status: Within Functional Limits for tasks assessed                                        General Comments      Exercises General Exercises - Lower Extremity Ankle Circles/Pumps: AROM;Both;10 reps;Supine Quad Sets: Strengthening;Both;10 reps;Supine   Assessment/Plan    PT Assessment Patient needs continued PT services  PT Problem List Decreased strength;Decreased activity tolerance;Decreased balance;Decreased safety awareness;Cardiopulmonary status limiting activity;Pain       PT Treatment Interventions DME instruction;Gait training;Stair training;Functional mobility training;Therapeutic activities;Therapeutic exercise;Balance training;Neuromuscular re-education;Patient/family education;Manual techniques;Modalities    PT Goals (Current goals can be found in the Care Plan section)  Acute Rehab PT Goals Patient Stated Goal: to return to son's home at d/c PT Goal Formulation: With patient Time For Goal Achievement: 02/28/18 Potential to Achieve Goals: Good    Frequency Min 2X/week   Barriers to  discharge        Co-evaluation               AM-PAC PT "6 Clicks" Daily Activity  Outcome Measure Difficulty turning over in bed (including adjusting bedclothes, sheets and blankets)?: A Lot Difficulty moving from lying on back to sitting on the side of the bed? : A Lot Difficulty sitting down on and standing up from a chair with arms (e.g., wheelchair, bedside commode, etc,.)?: A Little Help needed moving to and from a bed to chair (including a wheelchair)?: A Little Help needed walking in hospital room?: A Little Help needed climbing 3-5 steps with a railing? : A Little 6 Click Score: 16    End of Session Equipment Utilized During Treatment: Gait belt Activity Tolerance: Patient tolerated treatment well;Patient limited by fatigue Patient left: in chair;with call bell/phone within reach;Other (comment)(pt agrees to call RN if getting out of chair) Nurse Communication: Mobility status;Other (comment)(SpO2) PT Visit Diagnosis: Muscle weakness (generalized) (M62.81);Other abnormalities of gait and mobility (R26.89)    Time: 1420-1446 PT Time Calculation (min) (ACUTE ONLY): 26 min   Charges:   PT Evaluation $PT Eval Moderate Complexity: 1 Mod PT Treatments $Therapeutic Activity: 8-22 mins   PT G Codes:        Collie Siad PT, DPT 02/14/2018, 3:03 PM

## 2018-02-14 NOTE — Progress Notes (Signed)
Report called to [facility] on 2A; pt will be transported in wheelchair.  No supp O2 needed, Chart meds and belongings to transport with her.  VSS on room air, BP cuff moved to left arm for better pressure readings.  PICC flushed and caps changed by IV team member.  Patient agrees to transfer.

## 2018-02-15 ENCOUNTER — Inpatient Hospital Stay: Payer: 59

## 2018-02-15 LAB — BASIC METABOLIC PANEL
Anion gap: 10 (ref 5–15)
BUN: 101 mg/dL — ABNORMAL HIGH (ref 6–20)
CO2: 20 mmol/L — AB (ref 22–32)
CREATININE: 2.57 mg/dL — AB (ref 0.44–1.00)
Calcium: 8.9 mg/dL (ref 8.9–10.3)
Chloride: 100 mmol/L — ABNORMAL LOW (ref 101–111)
GFR calc non Af Amer: 20 mL/min — ABNORMAL LOW (ref >60)
GFR, EST AFRICAN AMERICAN: 23 mL/min — AB (ref >60)
Glucose, Bld: 206 mg/dL — ABNORMAL HIGH (ref 65–99)
POTASSIUM: 3.9 mmol/L (ref 3.5–5.1)
Sodium: 130 mmol/L — ABNORMAL LOW (ref 135–145)

## 2018-02-15 LAB — CBC WITH DIFFERENTIAL/PLATELET
BAND NEUTROPHILS: 0 %
BLASTS: 0 %
Basophils Absolute: 0 10*3/uL (ref 0–0.1)
Basophils Relative: 0 %
EOS ABS: 0 10*3/uL (ref 0–0.7)
Eosinophils Relative: 0 %
HEMATOCRIT: 21.6 % — AB (ref 35.0–47.0)
HEMOGLOBIN: 7.3 g/dL — AB (ref 12.0–16.0)
LYMPHS PCT: 13 %
Lymphs Abs: 0.5 10*3/uL — ABNORMAL LOW (ref 1.0–3.6)
MCH: 29.9 pg (ref 26.0–34.0)
MCHC: 33.9 g/dL (ref 32.0–36.0)
MCV: 88.2 fL (ref 80.0–100.0)
MONOS PCT: 6 %
Metamyelocytes Relative: 0 %
Monocytes Absolute: 0.2 10*3/uL (ref 0.2–0.9)
Myelocytes: 0 %
NEUTROS ABS: 3.2 10*3/uL (ref 1.4–6.5)
NEUTROS PCT: 81 %
OTHER: 0 %
PROMYELOCYTES RELATIVE: 0 %
Platelets: 223 10*3/uL (ref 150–440)
RBC: 2.45 MIL/uL — AB (ref 3.80–5.20)
RDW: 16.5 % — AB (ref 11.5–14.5)
WBC: 3.9 10*3/uL (ref 3.6–11.0)
nRBC: 22 /100 WBC — ABNORMAL HIGH

## 2018-02-15 LAB — MAGNESIUM: MAGNESIUM: 2.2 mg/dL (ref 1.7–2.4)

## 2018-02-15 LAB — PHOSPHORUS: PHOSPHORUS: 6.3 mg/dL — AB (ref 2.5–4.6)

## 2018-02-15 LAB — PROCALCITONIN: Procalcitonin: 1.6 ng/mL

## 2018-02-15 MED ORDER — AMOXICILLIN-POT CLAVULANATE 875-125 MG PO TABS: 1.0000 | ORAL_TABLET | Freq: Two times a day (BID) | ORAL | 0 refills | Status: AC

## 2018-02-15 MED ORDER — METOPROLOL SUCCINATE ER 50 MG PO TB24: 50.0000 mg | ORAL_TABLET | Freq: Every day | ORAL | 0 refills | Status: DC

## 2018-02-15 MED ORDER — LOSARTAN POTASSIUM 50 MG PO TABS
50.0000 mg | ORAL_TABLET | Freq: Every day | ORAL | Status: DC
  Administered 2018-02-15: 50 mg via ORAL
  Filled 2018-02-15: qty 1

## 2018-02-15 MED ORDER — LOSARTAN POTASSIUM 25 MG PO TABS: 25.0000 mg | ORAL_TABLET | Freq: Every day | ORAL | 11 refills | Status: DC

## 2018-02-15 NOTE — Progress Notes (Signed)
[facility]  ROUNDING NOTE   Subjective:   She is doing better Currently on room air Leg edema has improved but not resolved Serum creatinine has improved further down to 2.57 BUN is critically elevated at 101  able to eat without nausea vomiting    Objective:  Vital signs in last 24 hours:  Temp:  [97.3 F (36.3 C)-98.7 F (37.1 C)] 97.5 F (36.4 C) (04/30 0804) Pulse Rate:  [77-84] 79 (04/30 0804) Resp:  [20-28] 28 (04/29 1840) BP: (134-187)/(89-103) 151/99 (04/30 0804) SpO2:  [92 %-100 %] 100 % (04/30 0857) Weight:  [152 lb 11.2 oz (69.3 kg)-154 lb 11.2 oz (70.2 kg)] 152 lb 11.2 oz (69.3 kg) (04/30 0500)  Weight change: -7 lb 8.9 oz (-3.429 kg) Filed Weights   02/14/18 0500 02/14/18 1840 02/15/18 0500  Weight: 162 lb 4.1 oz (73.6 kg) 154 lb 11.2 oz (70.2 kg) 152 lb 11.2 oz (69.3 kg)    Intake/Output: I/O last 3 completed shifts: In: 716 [P.O.:711; I.V.:140] Out: 1165 [Urine:1165]   Intake/Output this shift:  No intake/output data recorded.  Physical Exam: General:  Sitting up in the bed, well-appearing today  Head: N/C  Eyes: Anicteric, no thrush  Neck: Supple,  Lungs:   Room air, clear to auscultation bilaterally  Heart: regular  Abdomen:  Soft, nontender, obese  Extremities:  1+ peripheral edema.  Neurologic: Following commands, alert and oriented  Skin: No lesions       Basic Metabolic Panel: Recent Labs  Lab 02/11/18 0409  02/12/18 0343 02/12/18 1019 02/13/18 0415 02/13/18 0438 02/14/18 0424 02/15/18 0607  NA 136  --  133*  --  132*  --  130*  130* 130*  K 5.7*   < > 5.8* 4.9 5.5*  --  4.7  4.7 3.9  CL 106  --  102  --  101  --  101  100* 100*  CO2 20*  --  20*  --  20*  --  18*  20* 20*  GLUCOSE 133*  --  153*  --  190*  --  161*  168* 206*  BUN 62*  --  80*  --  96*  --  101*  96* 101*  CREATININE 2.25*  --  3.04*  --  3.37*  --  3.12*  3.09* 2.57*  CALCIUM 8.2*  --  8.6*  --  8.5*  --  8.7*  8.8* 8.9  MG  --   --    --   --   --  2.3 2.3 2.2  PHOS  --   --  7.4*  --  7.8*  --  6.6*  6.9* 6.3*   < > = values in this interval not displayed.    Liver Function Tests: Recent Labs  Lab 02/09/18 1332 02/11/18 0409 02/12/18 0343 02/13/18 0415 02/14/18 0424  AST 35 34  --   --   --   ALT 23 21  --   --   --   ALKPHOS 86 72  --   --   --   BILITOT 0.4 0.7  --   --   --   PROT 6.3* 6.3*  --   --   --   ALBUMIN 2.7* 2.6* 2.5* 2.6* 2.6*   No results for input(s): LIPASE, AMYLASE in the last 168 hours. No results for input(s): AMMONIA in the last 168 hours.  CBC: Recent Labs  Lab 02/09/18 1332 02/10/18 2310 02/12/18 0343 02/13/18 9678 02/15/18 9381  WBC 6.3 6.1 5.1 4.4 3.9  NEUTROABS 4.9 4.6 3.4  --  3.2  HGB 8.6* 8.0* 7.7* 7.4* 7.3*  HCT 25.8* 24.0* 23.0* 22.1* 21.6*  MCV 89.7 89.1 89.2 89.4 88.2  PLT 303 286 268 246 223    Cardiac Enzymes: Recent Labs  Lab 02/09/18 1332 02/09/18 1853 02/10/18 1358 02/11/18 0409  TROPONINI 0.05* 0.10* 0.33* 0.26*    BNP: Invalid input(s): POCBNP  CBG: Recent Labs  Lab 02/11/18 0633 02/11/18 1519 02/11/18 2157 02/11/18 2302 02/14/18 1622  GLUCAP 131* 135* 326* 190* 179*    Microbiology: Results for orders placed or performed during the hospital encounter of 02/09/18  MRSA PCR Screening     Status: None   Collection Time: 02/10/18  4:28 AM  Result Value Ref Range Status   MRSA by PCR NEGATIVE NEGATIVE Final    Comment:        The GeneXpert MRSA Assay (FDA approved for NASAL specimens only), is one component of a comprehensive MRSA colonization surveillance program. It is not intended to diagnose MRSA infection nor to guide or monitor treatment for MRSA infections. Performed at [facility], [address] [place], [place] [address]     Coagulation Studies: No results for input(s): LABPROT, INR in the last 72 hours.  Urinalysis: No results for input(s): COLORURINE, LABSPEC, PHURINE, GLUCOSEU, HGBUR, BILIRUBINUR,  KETONESUR, PROTEINUR, UROBILINOGEN, NITRITE, LEUKOCYTESUR in the last 72 hours.  Invalid input(s): APPERANCEUR    Imaging: Dg Chest Port 1 View  Result Date: 02/15/2018 CLINICAL DATA:  CHF EXAM: PORTABLE CHEST 1 VIEW COMPARISON:  02/13/2018 chest radiograph. FINDINGS: Right PICC terminates in upper third of the superior vena cava. Stable cardiomediastinal silhouette with mild to moderate cardiomegaly. No pneumothorax. Stable small bilateral pleural effusions. Borderline mild pulmonary edema appears slightly improved. Stable mild patchy bibasilar lung opacities. IMPRESSION: 1. Borderline mild congestive heart failure, slightly improved. 2. Stable small bilateral pleural effusions. 3. Stable mild patchy bibasilar lung opacities, favor atelectasis. Electronically Signed   By: Ilona Sorrel M.D.   On: 02/15/2018 07:06     Medications:   . sodium chloride    . ceFEPime (MAXIPIME) IV Stopped (02/14/18 1403)   . budesonide (PULMICORT) nebulizer solution  0.5 mg Nebulization BID  . cholecalciferol  2,000 Units Oral Daily  . enoxaparin (LOVENOX) injection  30 mg Subcutaneous Q24H  . hydrALAZINE  25 mg Oral Q8H  . hydroxychloroquine  200 mg Oral Daily  . letrozole  2.5 mg Oral Daily  . levalbuterol  0.63 mg Nebulization TID  . magnesium oxide  400 mg Oral Daily  . mouth rinse  15 mL Mouth Rinse BID  . methylPREDNISolone (SOLU-MEDROL) injection  40 mg Intravenous Q12H  . metoprolol succinate  50 mg Oral Daily  . multivitamin with minerals  1 tablet Oral Daily  . mycophenolate  500 mg Oral BID  . pantoprazole  40 mg Oral QAC breakfast  . patiromer  16.8 g Oral Daily  . polyethylene glycol  17 g Oral Daily  . sodium chloride flush  10-40 mL Intracatheter Q12H   sodium chloride, acetaminophen, hydrALAZINE, magnesium hydroxide, metoprolol tartrate, ondansetron (ZOFRAN) IV, sodium chloride flush, triamcinolone cream  Assessment/ Plan:  Ms. Zennie Ayars is a 52 y.o. black female SLE  glomerulonephritis WHO class V (membranous) biopsy on 12/03/2017, hypertension, systolic congestive heart failure, anemia, vitamin D deficiency, breast cancer status post left mastectomy, chemotherapy and radiation who presents as a follow up patient.  1.  Acute renal failure with  hyperkalemia on Lupus Nephritis: class V membranous. with proteinuria, hematuria and ANA positive. History of normal compliments. 1.45 grams of proteinuria Creatinine elevated. BUN elevation could be due to stress dose steroids.  - Continue mycophenolate. Stress dose steroids.  - may restart losartan  2. Acute Respiratory Failure requiring noninvasive ventilation. Respiratory failure seems secondary to pulmonary edema and acute exacerbation of systolic/diastolic congestive heart failure. EF 25-30% on echocardiogram 4/24 - May transition to oral diuretics tomorrow; Home dose of lasix at 80 mg daily.  - Standing weight today - high BUN likely due to stress dose steroids.  - LE edema is improving - outpatient follow up  3. Anemia: beta thalassemia. Hemoglobin 7.3  4. Hypertension: history of difficult to control.  - restart losartan    LOS: 6 Foxx Klarich 4/30/20199:56 AM

## 2018-02-15 NOTE — Progress Notes (Signed)
Per MD patient will not need prolonged intravenous therapy so PICC line can come out. IV team consult put in to remove PICC. Will continue to monitor patient. Tele monitor taken off.

## 2018-02-15 NOTE — Progress Notes (Signed)
Cardiovascular and Pulmonary Nurse Navigator Note  52 year old female with SLE glomerulonephritis WHO class V (membranous) membranous bx on 01/19/4741, HTN, systolic CHF, anemia, Vit D deficiency, breast cancer - s/p left mastectomy with chemo and radiation presented with difficulty breathing.    Patient found to have acute respiratory failure with hypoxia secondary to acute CHF exacerbation with pulmonary edema and possible underlying lupus pneumonitis.  BNP 1941.  EF 25-30%.  Cardiology and Renal followed patient while hospitalized.    CHF Education:   Educational session with patient completed.  Provided patient with "Living Better with Heart Failure" packet. Briefly reviewed definition of heart failure and signs and symptoms of an exacerbation.?Explained to patient that HF is a chronic illness which requires self-assessment / self-management along with help from the cardiologist/PCP.?? ? *Reviewed importance of and reason behind checking weight daily in the AM, after using the bathroom, but before getting dressed. Patient has scales.  ? *Reviewed with patient the following information: *Discussed when to call the Dr= weight gain of >2-3lb overnight of 5lb in a week,  *Discussed yellow zone= call MD: weight gain of >2-3lb overnight of 5lb in a week, increased swelling, increased SOB when lying down, chest discomfort, dizziness, increased fatigue *Red Zone= call 911: struggle to breath, fainting or near fainting, significant chest pain   *Reviewed low sodium diet-provided handout of recommended and not recommended foods.? Provided patient with information/list on the sodium content (high, moderate and low)  in foods. Patient reports she uses Mrs. Dash for seasoning instead of salt.     *Discussed fluid intake with patient as well. Patient was informed by Dr. Candiss Norse not to exceed one water pitcher of fluid per 24 hours.  Patient shared with this RN that she has been drinking 24 ounces of water, 12  ounces of tea, and, and, and. This was her daily routine.  With the first two items she exceed 1000 ml.  Patient reported that no one had ever told her restrict her fluids.   Patient stated that she will follow this fluid restriction.     *Instructed patient to take medications as prescribed for heart failure. Explained briefly why pt is on the medications (either make you feel better, live longer or keep you out of the hospital) and discussed monitoring and side effects.  ? *Discussed exercise.  PT is recommending in-home PT.  Explained to patient after she has completed in home PT she will be a candidate for Cardiac Rehab given her EF of 25-30%.  Overview of the program provided.  Cardiac Rehab brochure, information sheet, and CPT billing codes provided to patient.  This nurse entered order for Cardiac Rehab for patient.    *Smoking Cessation- Patient is a never smoker.? ? *Follow-ups:   [facility] - Patient is known to the HF Clinic.  F/U appointment scheduled for 02/21/2018 at 9:20 a.m.   Dr. Saralyn Pilar - 02/22/2018 at 11:30 a.m.   Dr. Candiss Norse - 02/25/2018 at 9:30 a.m.   Dr. Danise Mina - 03/01/2018 at 9:30 a.m.   Home Health Services - RN, PT, HHA, SW, Respiratory Care, SLP, OT ordered by Dr. Vianne Bulls.    Again, the 5 Steps to Living Better with Heart Failure were reviewed with patient.   Patient thanked me for providing the above information. ? ? Roanna Epley, RN, BSN, [facility]? [facility]  Cardiovascular &?Pulmonary Nurse Navigator  Direct Line: [phone]  Department Phone #: [phone] Fax: [phone]? Email Address: [email or web address]

## 2018-02-15 NOTE — Progress Notes (Signed)
Discharge instructions given to patient. Patient verbalized understanding with no further questions. PICC line removed by SWOT nurse Brandi.No issues with PICC removal, no bleeding.  Patient's belongings gathered and sent down with son. Patient transported down to family vehicle via wheelchair in stable conditions.

## 2018-02-15 NOTE — Plan of Care (Signed)
  Problem: Education: Goal: Knowledge of General Education information will improve Outcome: Adequate for Discharge   Problem: Health Behavior/Discharge Planning: Goal: Ability to manage health-related needs will improve Outcome: Adequate for Discharge   Problem: Clinical Measurements: Goal: Ability to maintain clinical measurements within normal limits will improve Outcome: Adequate for Discharge Goal: Will remain free from infection Outcome: Adequate for Discharge Goal: Diagnostic test results will improve Outcome: Adequate for Discharge Goal: Respiratory complications will improve Outcome: Adequate for Discharge Goal: Cardiovascular complication will be avoided Outcome: Adequate for Discharge   Problem: Activity: Goal: Risk for activity intolerance will decrease Outcome: Adequate for Discharge   Problem: Nutrition: Goal: Adequate nutrition will be maintained Outcome: Adequate for Discharge   Problem: Coping: Goal: Level of anxiety will decrease Outcome: Adequate for Discharge   Problem: Elimination: Goal: Will not experience complications related to bowel motility Outcome: Adequate for Discharge Goal: Will not experience complications related to urinary retention Outcome: Adequate for Discharge   Problem: Pain Managment: Goal: General experience of comfort will improve Outcome: Adequate for Discharge   Problem: Safety: Goal: Ability to remain free from injury will improve Outcome: Adequate for Discharge   Problem: Skin Integrity: Goal: Risk for impaired skin integrity will decrease Outcome: Adequate for Discharge   Problem: Education: Goal: Ability to demonstrate management of disease process will improve Outcome: Adequate for Discharge Goal: Ability to verbalize understanding of medication therapies will improve Outcome: Adequate for Discharge   Problem: Activity: Goal: Capacity to carry out activities will improve Outcome: Adequate for Discharge    Problem: Cardiac: Goal: Ability to achieve and maintain adequate cardiopulmonary perfusion will improve Outcome: Adequate for Discharge

## 2018-02-16 ENCOUNTER — Telehealth: Payer: Self-pay | Admitting: *Deleted

## 2018-02-16 LAB — PATHOLOGIST SMEAR REVIEW

## 2018-02-16 LAB — CALCIUM, IONIZED: CALCIUM, IONIZED, SERUM: 4.8 mg/dL (ref 4.5–5.6)

## 2018-02-16 NOTE — Telephone Encounter (Signed)
Transition Care Management Follow-up Telephone Call   Date discharged? 02/15/2018   How have you been since you were released from the hospital? "just hoarse"   Do you understand why you were in the hospital? yes   Do you understand the discharge instructions? Yes. Was given an abx for 10d    Where were you discharged to? home   Items Reviewed:  Medications reviewed: yes  Allergies reviewed: yes  Dietary changes reviewed: yes. Discussed low to lower salt intake and decrease fluid intake Referrals reviewed: yes; pt received referral to new cardiologist  Functional Questionnaire:  Activities of Daily Living (ADLs):   She states they are independent in the following: independent in all areas   Any transportation issues/concerns?: no   Any patient concerns? no   Confirmed importance and date/time of follow-up visits scheduled yes  Provider Appointment booked with Dr Danise Mina   Confirmed with patient if condition begins to worsen call PCP or go to the ER.  Patient was given the office number and encouraged to call back with question or concerns.  : yes

## 2018-02-16 NOTE — Telephone Encounter (Signed)
Lm requesting return call to complete TCM and confirm hosp f/u appt  

## 2018-02-17 ENCOUNTER — Telehealth: Payer: Self-pay | Admitting: Family Medicine

## 2018-02-17 NOTE — Telephone Encounter (Signed)
Copied from [place]. Topic: Quick Communication - See Telephone Encounter >> Feb 17, 2018 11:46 AM Cleaster Corin, NT wrote: CRM for notification. See Telephone encounter for: 02/17/18.  [facility] pope [facility] Darnell Level know that pt. Refused nursing at home but will continue with PT.Amy can be reached at [phone]

## 2018-02-17 NOTE — Telephone Encounter (Signed)
Noted  
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
## 2018-02-21 ENCOUNTER — Telehealth: Payer: Self-pay | Admitting: Family Medicine

## 2018-02-21 ENCOUNTER — Ambulatory Visit: Payer: 59 | Admitting: Family

## 2018-02-21 DIAGNOSIS — I130 Hypertensive heart and chronic kidney disease with heart failure and stage 1 through stage 4 chronic kidney disease, or unspecified chronic kidney disease: Secondary | ICD-10-CM | POA: Diagnosis not present

## 2018-02-21 DIAGNOSIS — N189 Chronic kidney disease, unspecified: Secondary | ICD-10-CM | POA: Diagnosis not present

## 2018-02-21 DIAGNOSIS — I5023 Acute on chronic systolic (congestive) heart failure: Secondary | ICD-10-CM | POA: Diagnosis not present

## 2018-02-21 NOTE — Telephone Encounter (Signed)
Copied from [place] [id number]. Topic: Inquiry >> Feb 21, 2018  4:52 PM Melanie Holloway wrote: Reason for CRM: [facility] called for verbal orders for physical therapy, pt is also requesting medication Holloway/c pt has thrush in her mouth, call [facility] and pt to advise [facility]: [phone]

## 2018-02-22 DIAGNOSIS — I10 Essential (primary) hypertension: Secondary | ICD-10-CM | POA: Diagnosis not present

## 2018-02-22 DIAGNOSIS — I130 Hypertensive heart and chronic kidney disease with heart failure and stage 1 through stage 4 chronic kidney disease, or unspecified chronic kidney disease: Secondary | ICD-10-CM | POA: Diagnosis not present

## 2018-02-22 DIAGNOSIS — I5023 Acute on chronic systolic (congestive) heart failure: Secondary | ICD-10-CM | POA: Diagnosis not present

## 2018-02-22 DIAGNOSIS — N189 Chronic kidney disease, unspecified: Secondary | ICD-10-CM | POA: Diagnosis not present

## 2018-02-22 DIAGNOSIS — N184 Chronic kidney disease, stage 4 (severe): Secondary | ICD-10-CM | POA: Diagnosis not present

## 2018-02-22 MED ORDER — FLUCONAZOLE 100 MG PO TABS: 100.0000 mg | ORAL_TABLET | Freq: Every day | ORAL | 0 refills | Status: DC

## 2018-02-22 NOTE — Telephone Encounter (Signed)
Let her know Dr Darnell Level is out so I am reviewing her chart  Has she had it before?  When she looks in the mirror what does her tongue and mouth look like?  Any pain/discomfort or altered taste?  Sore throat or problems swallowing?   Fever or symptoms of illness Is she currently taking any chemotherapy or antibiotics?

## 2018-02-22 NOTE — Telephone Encounter (Signed)
Pt is still on augmentin for a few more days, she has had thrush before and she said this looks like it. Pt said she has raised bumps in her mouth with a few white patches. Pt said she has had thrush before (usually due to abx use), and it looks exactly like how it did before. Pt said she doesn't have any pain or discomfort or tasting issues but she called [place] before it got that bad. Pt said she does have a slight fever but she is hoping the abx will take care of that

## 2018-02-22 NOTE — Telephone Encounter (Signed)
Patient advised.

## 2018-02-22 NOTE — Telephone Encounter (Signed)
I [facility] [place] [facility] if not improved

## 2018-02-22 NOTE — Telephone Encounter (Signed)
I spoke with pt and she said she has raised bumps in her mouth and she knows it is thrush. Pt request "the pill' ?diflucan to [facility] [place]. Dr Darnell Level out of office until 03/01/18. Please advise.
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
## 2018-02-23 ENCOUNTER — Ambulatory Visit: Payer: 59 | Admitting: Family

## 2018-02-24 DIAGNOSIS — I130 Hypertensive heart and chronic kidney disease with heart failure and stage 1 through stage 4 chronic kidney disease, or unspecified chronic kidney disease: Secondary | ICD-10-CM | POA: Diagnosis not present

## 2018-02-24 DIAGNOSIS — I5023 Acute on chronic systolic (congestive) heart failure: Secondary | ICD-10-CM | POA: Diagnosis not present

## 2018-02-24 DIAGNOSIS — N189 Chronic kidney disease, unspecified: Secondary | ICD-10-CM | POA: Diagnosis not present

## 2018-02-25 ENCOUNTER — Telehealth: Payer: Self-pay

## 2018-02-25 DIAGNOSIS — N179 Acute kidney failure, unspecified: Secondary | ICD-10-CM | POA: Diagnosis not present

## 2018-02-25 DIAGNOSIS — N183 Chronic kidney disease, stage 3 (moderate): Secondary | ICD-10-CM | POA: Diagnosis not present

## 2018-02-25 DIAGNOSIS — E785 Hyperlipidemia, unspecified: Secondary | ICD-10-CM | POA: Diagnosis not present

## 2018-02-25 DIAGNOSIS — I129 Hypertensive chronic kidney disease with stage 1 through stage 4 chronic kidney disease, or unspecified chronic kidney disease: Secondary | ICD-10-CM | POA: Diagnosis not present
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
## 2018-02-25 NOTE — Telephone Encounter (Signed)
Copied from [place] [id number]. Topic: Inquiry >> Feb 25, 2018  2:14 PM Melanie Holloway wrote: Reason for CRM: [facility] called to let pcp know pt declined the OT evaluation, call pt if needed

## 2018-02-28 NOTE — Telephone Encounter (Signed)
Noted OT declined

## 2018-03-01 ENCOUNTER — Telehealth: Payer: Self-pay | Admitting: *Deleted

## 2018-03-01 ENCOUNTER — Encounter: Payer: Self-pay | Admitting: Family Medicine

## 2018-03-01 ENCOUNTER — Ambulatory Visit (INDEPENDENT_AMBULATORY_CARE_PROVIDER_SITE_OTHER): Payer: 59 | Admitting: Family Medicine

## 2018-03-01 VITALS — BP 136/82 | HR 88 | Temp 97.8°F | Ht 59.0 in | Wt 152.8 lb

## 2018-03-01 DIAGNOSIS — I890 Lymphedema, not elsewhere classified: Secondary | ICD-10-CM

## 2018-03-01 DIAGNOSIS — I10 Essential (primary) hypertension: Secondary | ICD-10-CM

## 2018-03-01 DIAGNOSIS — R531 Weakness: Secondary | ICD-10-CM

## 2018-03-01 DIAGNOSIS — D561 Beta thalassemia: Secondary | ICD-10-CM | POA: Diagnosis not present

## 2018-03-01 DIAGNOSIS — I5042 Chronic combined systolic (congestive) and diastolic (congestive) heart failure: Secondary | ICD-10-CM

## 2018-03-01 DIAGNOSIS — M3214 Glomerular disease in systemic lupus erythematosus: Secondary | ICD-10-CM | POA: Diagnosis not present

## 2018-03-01 DIAGNOSIS — R600 Localized edema: Secondary | ICD-10-CM | POA: Diagnosis not present

## 2018-03-01 DIAGNOSIS — J8489 Other specified interstitial pulmonary diseases: Secondary | ICD-10-CM

## 2018-03-01 NOTE — Telephone Encounter (Signed)
Copied from [place] #[id number]. Topic: Inquiry >> Mar 01, 2018  9:14 AM Pricilla Handler wrote: Reason for CRM: Merry Proud with [facility] [phone]) called requesting PT Verbal Orders for the following: PT Once a Week for 2 Weeks; Twice a Week for 1 Week, and Once a week for 1 week (5 Visits Total). Please call Merry Proud at [phone].       Thank You!!!

## 2018-03-01 NOTE — Telephone Encounter (Signed)
Agree with this. Thanks.  

## 2018-03-01 NOTE — Patient Instructions (Addendum)
Sign release up front for latest labs and office visit from Dr Juleen China. Good to see you today, work with home physical therapy.  I will send a message to Dr Jefm Bryant and CHF clinic. Keep upcoming appointments.

## 2018-03-01 NOTE — Progress Notes (Signed)
BP 136/82 (BP Location: Right Arm, Patient Position: Sitting, Cuff Size: Normal)   Pulse 88   Temp 97.8 F (36.6 C) (Oral)   Ht 4\' 11"  (1.499 m)   Wt 152 lb 12 oz (69.3 kg)   SpO2 97%   BMI 30.85 kg/m    CC: hosp f/u visit Subjective:    Patient ID: Melanie Holloway, female    DOB: 10-Jul-1966, 52 y.o.   MRN: [id number]  HPI: Melanie Holloway is a 52 y.o. female presenting on 03/01/2018 for Hospitalization Follow-up (Admitted to [facility] on 02/09/18, primary dx- acute on chronic CHF.  Pt accompainied by daughter.)   Recent hospitalization for acute on chronic systolic CHF exacerbation, EF found to be low 25-30%, treated with IV diuresis (IV lasix drip) which was transitioned to oral lasix 80mg  daily. She did receive BiPAP treatment during hospitalization and needed care in stepdown unit due to dyspnea. For possible lupus pneumonitis, received antibiotic therapy (10d augmentin course). Losartan was held due to hyperkalemia (5.5) but was restarted on discharge. She also received stress dose steroids for her SLE hx.   EF dropped from 40% to 20% in last 3 months - unclear cause.   HH involved, pt declined OT and SN, is working with PT. Planned progression to cardiac rehab when HHPT completed.  Recent oral thrush - this is better after diflucan.   Saw Dr Juleen China on Friday - started on zaroxolyn 2.5mg  daily with lasix. F/u planned 2 wks.   Has established with [facility] cardiology Dr Saralyn Pilar - latest note reviewed. Acute on chronic sCHF with latest EF 25-30%. rec low sodium diet, planned f/u 3 months.   Seeing Dr Jefm Bryant rheum next month.  Sees CHF clinic 03/10/2018.   She is checking daily weights at home, watching salt in diet.  Ongoing stable dyspnea, orthopnea, leg swelling (some improvement). Sleeping on 3 pillows at night. Cough present at night. No fevers.  Walks with rollator. No falls.   Admission date:  02/09/2018   Discharge Date:  02/15/2018  TCM hosp f/u phone call performed  02/16/2018  Recommendations for primary care physician for things to follow: Follow-up with PCP in 1 week Follow-up with cardiology in 10 days Follow-up with Dr. Murlean Iba from nephrology in 1 week  Discharge Diagnosis  Acute pulmonary edema ([place]) [J81.0] SOB (shortness of breath) [R06.02] Acute on chronic congestive heart failure, unspecified heart failure type ([place]) [I50.9]    Active Problems:   CHF (congestive heart failure) ([place])  Relevant past medical, surgical, family and social history reviewed and updated as indicated. Interim medical history since our last visit reviewed.  Allergies and medications reviewed and updated. Current medications reconciled with discharge medications.  No facility-administered medications prior to visit.    Outpatient Medications Prior to Visit  Medication Sig Dispense Refill  . acetaminophen (TYLENOL) 325 MG tablet Take 2 tablets (650 mg total) by mouth every 4 (four) hours as needed for headache or mild pain. 30 tablet 0  . Cholecalciferol (VITAMIN D) 2000 UNITS CAPS Take 1 capsule (2,000 Units total) by mouth daily. 30 capsule   . fluconazole (DIFLUCAN) 100 MG tablet Take 1 tablet (100 mg total) by mouth daily. 1 tablet 0  . furosemide (LASIX) 40 MG tablet Take 2 tablets (80 mg total) by mouth daily. 60 tablet 0  . hydroxychloroquine (PLAQUENIL) 200 MG tablet Take 1 tablet (200 mg total) by mouth daily. 60 tablet 2  . letrozole (FEMARA) 2.5 MG tablet TAKE 1 TABLET BY MOUTH EVERY  DAY 90 tablet 1  . losartan (COZAAR) 50 MG tablet TAKE 1 TABLET BY MOUTH EVERY DAY 90 tablet 1  . Magnesium 400 MG CAPS Take 400 mg by mouth daily. 30 capsule 0  . metoprolol succinate (TOPROL-XL) 50 MG 24 hr tablet Take 1 tablet (50 mg total) by mouth daily. Take with or immediately following a meal. 30 tablet 0  . Multiple Vitamin (MULTIVITAMIN WITH MINERALS) TABS tablet Take 1 tablet by mouth daily. 30 tablet 0  . mycophenolate (CELLCEPT) 250 MG capsule Take 2  capsules (500 mg total) by mouth 2 (two) times daily. 60 capsule 0  . predniSONE (DELTASONE) 10 MG tablet Take 1 tablet (10 mg total) by mouth daily. 30 tablet 0  . triamcinolone cream (KENALOG) 0.1 % Apply 1 application topically 2 (two) times daily. Apply to AA. 453.6 g 0  . protein supplement shake (PREMIER PROTEIN) LIQD Take 325 mLs (11 oz total) by mouth 2 (two) times daily between meals. 60 Can 0  . metolazone (ZAROXOLYN) 2.5 MG tablet Take 1 tablet (2.5 mg total) by mouth daily. 30 min before lasix ([name])       Per HPI unless specifically indicated in ROS section below Review of Systems     Objective:    BP 136/82 (BP Location: Right Arm, Patient Position: Sitting, Cuff Size: Normal)   Pulse 88   Temp 97.8 F (36.6 C) (Oral)   Ht 4\' 11"  (1.499 m)   Wt 152 lb 12 oz (69.3 kg)   SpO2 97%   BMI 30.85 kg/m   Wt Readings from Last 3 Encounters:  03/02/18 147 lb (66.7 kg)  03/01/18 152 lb 12 oz (69.3 kg)  02/15/18 155 lb 9.6 oz (70.6 kg)    Physical Exam  Constitutional: She appears well-developed and well-nourished. No distress.  HENT:  Head: Normocephalic and atraumatic.  Mouth/Throat: Oropharynx is clear and moist. No oropharyngeal exudate.  Eyes: Pupils are equal, round, and reactive to light. Conjunctivae and EOM are normal.  Neck: Normal range of motion.  Cardiovascular: Normal rate, regular rhythm and normal heart sounds.  No murmur heard. Pulmonary/Chest: Effort normal and breath sounds normal. No stridor. No respiratory distress. She has no wheezes. She has no rales.  Coarse, crackles bibasilarly  Musculoskeletal: She exhibits edema (tr pitting bilaterally).  Compression stockings in place  Nursing note and vitals reviewed.     Assessment & Plan:  Pt declined HH OT, SN, but desires PT. Apparently [facility] is requesting updated referral - placed today.  Problem List Items Addressed This Visit    Beta thalassemia ([place])   Relevant Orders   Ambulatory  referral to [place]   Chronic combined systolic and diastolic heart failure (HCC)    EF dropped from 40% to 20% in last 3 months, unclear cause. ?lupus, ?med related (hydroxychloroquine). Will also request rheum input for this. She has established with CHF clinic - upcoming appt next week. Zaroxolyn 2.5mg  was recently added to daily lasix 80mg , she had labs with renal last week and has close f/u for repeat labs. Will request renal records today.      Relevant Medications   metolazone (ZAROXOLYN) 2.5 MG tablet   Other Relevant Orders   Ambulatory referral to [place] hypertension    Chronic, stable. Continue current regimen including losartan, metoprolol XL, metolazone daily and lasix 80mg  daily.       Relevant Medications   metolazone (ZAROXOLYN) 2.5 MG tablet   Interstitial pneumonitis (HCC)  Completed augmentin course. Dyspnea is improved but persists. Continue to monitor.       Lupus nephritis, ISN/RPS class V ([place])    Pt states she had labwork last week at his office - will not check labs today and instead will request latest records from Dr Juleen China.       Relevant Orders   Ambulatory referral to Home Health   Lymphedema   Relevant Orders   Ambulatory referral to Home Health   Pedal edema    Chronic, stable. Continue compression stockings and lasix 80mg  daily along with new metolazone 2.5mg  daily.      Relevant Orders   Ambulatory referral to Home Health   Systemic lupus erythematosus ([place]) - Primary    Continues hydrochloroquine 200mg  daily, cellcept 500mg  bid, and prednisone 10mg  daily. Unclear plan for prednisone so I refilled this last month - would like input from rheum about prednisone plan, she has f/u with Dr Jefm Bryant later this summer      Relevant Orders   Ambulatory referral to [place]    Other Visit Diagnoses    Generalized weakness       Relevant Orders   Ambulatory referral to [place]       No orders of the defined types  were placed in this encounter.  Orders Placed This Encounter  Procedures  . Ambulatory referral to Home Health    Referral Priority:   Routine    Referral Type:   Home Health Care    Referral Reason:   Specialty Services Required    Requested Specialty:   [place]    Number of Visits Requested:   1    Follow up plan: Return if symptoms worsen or fail to improve.  Ria Bush, MD

## 2018-03-02 ENCOUNTER — Other Ambulatory Visit: Payer: Self-pay

## 2018-03-02 ENCOUNTER — Ambulatory Visit: Payer: Self-pay | Admitting: *Deleted

## 2018-03-02 ENCOUNTER — Emergency Department
Admission: EM | Admit: 2018-03-02 | Discharge: 2018-03-02 | Disposition: A | Discharge status: Home / Self Care | Payer: 59 | Attending: Emergency Medicine | Admitting: Emergency Medicine

## 2018-03-02 ENCOUNTER — Emergency Department: Payer: 59

## 2018-03-02 DIAGNOSIS — I509 Heart failure, unspecified: Secondary | ICD-10-CM

## 2018-03-02 DIAGNOSIS — I110 Hypertensive heart disease with heart failure: Secondary | ICD-10-CM | POA: Diagnosis not present

## 2018-03-02 DIAGNOSIS — Z853 Personal history of malignant neoplasm of breast: Secondary | ICD-10-CM | POA: Diagnosis not present

## 2018-03-02 DIAGNOSIS — Z79899 Other long term (current) drug therapy: Secondary | ICD-10-CM | POA: Insufficient documentation

## 2018-03-02 DIAGNOSIS — I5043 Acute on chronic combined systolic (congestive) and diastolic (congestive) heart failure: Secondary | ICD-10-CM | POA: Diagnosis not present

## 2018-03-02 DIAGNOSIS — N189 Chronic kidney disease, unspecified: Secondary | ICD-10-CM | POA: Insufficient documentation

## 2018-03-02 DIAGNOSIS — I130 Hypertensive heart and chronic kidney disease with heart failure and stage 1 through stage 4 chronic kidney disease, or unspecified chronic kidney disease: Secondary | ICD-10-CM | POA: Diagnosis not present

## 2018-03-02 DIAGNOSIS — R0989 Other specified symptoms and signs involving the circulatory and respiratory systems: Secondary | ICD-10-CM

## 2018-03-02 DIAGNOSIS — R0602 Shortness of breath: Secondary | ICD-10-CM | POA: Diagnosis not present

## 2018-03-02 LAB — COMPREHENSIVE METABOLIC PANEL
ALT: 69 U/L — ABNORMAL HIGH (ref 14–54)
ANION GAP: 12 (ref 5–15)
AST: 76 U/L — ABNORMAL HIGH (ref 15–41)
Albumin: 2.9 g/dL — ABNORMAL LOW (ref 3.5–5.0)
Alkaline Phosphatase: 269 U/L — ABNORMAL HIGH (ref 38–126)
BUN: 56 mg/dL — ABNORMAL HIGH (ref 6–20)
CHLORIDE: 108 mmol/L (ref 101–111)
CO2: 18 mmol/L — AB (ref 22–32)
Calcium: 8.6 mg/dL — ABNORMAL LOW (ref 8.9–10.3)
Creatinine, Ser: 1.87 mg/dL — ABNORMAL HIGH (ref 0.44–1.00)
GFR, EST AFRICAN AMERICAN: 34 mL/min — AB (ref >60)
GFR, EST NON AFRICAN AMERICAN: 30 mL/min — AB (ref >60)
Glucose, Bld: 129 mg/dL — ABNORMAL HIGH (ref 65–99)
POTASSIUM: 3.4 mmol/L — AB (ref 3.5–5.1)
SODIUM: 138 mmol/L (ref 135–145)
Total Bilirubin: 0.6 mg/dL (ref 0.3–1.2)
Total Protein: 7.1 g/dL (ref 6.5–8.1)

## 2018-03-02 LAB — TROPONIN I: TROPONIN I: 0.04 ng/mL — AB (ref <0.03)

## 2018-03-02 LAB — CBC
HCT: 23.5 % — ABNORMAL LOW (ref 35.0–47.0)
Hemoglobin: 7.8 g/dL — ABNORMAL LOW (ref 12.0–16.0)
MCH: 29.4 pg (ref 26.0–34.0)
MCHC: 33 g/dL (ref 32.0–36.0)
MCV: 89.2 fL (ref 80.0–100.0)
PLATELETS: 269 10*3/uL (ref 150–440)
RBC: 2.64 MIL/uL — ABNORMAL LOW (ref 3.80–5.20)
RDW: 17.9 % — AB (ref 11.5–14.5)
WBC: 4.6 10*3/uL (ref 3.6–11.0)

## 2018-03-02 LAB — FIBRIN DERIVATIVES D-DIMER (ARMC ONLY): FIBRIN DERIVATIVES D-DIMER (ARMC): 727.08 ng{FEU}/mL — AB (ref 0.00–499.00)

## 2018-03-02 MED ORDER — ALBUTEROL SULFATE (2.5 MG/3ML) 0.083% IN NEBU
5.0000 mg | INHALATION_SOLUTION | Freq: Once | RESPIRATORY_TRACT | Status: AC
  Administered 2018-03-02: 5 mg via RESPIRATORY_TRACT
  Filled 2018-03-02: qty 6

## 2018-03-02 NOTE — ED Notes (Signed)
Patient denies pain and is resting comfortably.  

## 2018-03-02 NOTE — Telephone Encounter (Signed)
Pt calling stating that she feels like she has "fluid build up". Pt asked to explain what she means pt states her legs feel tighter but they always feel tight. Pt states she had SOB since yesterday and mostly has it with walking. Pt states she has been using inhaler and nebulizer with improvement. Pt states she has a history of CHF and was admitted to the hospital previously when she felt this way. Pt states she weighed herself on yesterday and she was 150lb and when she weighed herself today she was 147lb. Pt states she is going to have a blood transfusion on tomorrow and wanted to know if she should go to the ED. Explained to the pt that she is experiencing SOB that is worse than normal she should go to the ED. Pt verbalized understanding.   Reason for Disposition . [1] MILD difficulty breathing (e.g., minimal/no SOB at rest, SOB with walking, pulse <100) AND [2] NEW-onset or WORSE than normal  Answer Assessment - Initial Assessment Questions 1. RESPIRATORY STATUS: "Describe your breathing?" (e.g., wheezing, shortness of breath, unable to speak, severe coughing)      Shortness of breath, wheezing at night 2. ONSET: "When did this breathing problem begin?"      Since yesterday 3. PATTERN "Does the difficult breathing come and go, or has it been constant since it started?"     Comes and goes 4. SEVERITY: "How bad is your breathing?" (e.g., mild, moderate, severe)    - MILD: No SOB at rest, mild SOB with walking, speaks normally in sentences, can lay down, no retractions, pulse < 100.    - MODERATE: SOB at rest, SOB with minimal exertion and prefers to sit, cannot lie down flat, speaks in phrases, mild retractions, audible wheezing, pulse 100-120.    - SEVERE: Very SOB at rest, speaks in single words, struggling to breathe, sitting hunched forward, retractions, pulse > 120      Mostly with walking 5. RECURRENT SYMPTOM: "Have you had difficulty breathing before?" If so, ask: "When was the last time?"  and "What happened that time?"      Yes has had SOB about 2 weeks ago 6. CARDIAC HISTORY: "Do you have any history of heart disease?" (e.g., heart attack, angina, bypass surgery, angioplasty)      CHF 7. LUNG HISTORY: "Do you have any history of lung disease?"  (e.g., pulmonary embolus, asthma, emphysema)     no 8. CAUSE: "What do you think is causing the breathing problem?"      CHF 9. OTHER SYMPTOMS: "Do you have any other symptoms? (e.g., dizziness, runny nose, cough, chest pain, fever)     Dizziness earlier but not at this time 10. PREGNANCY: "Is there any chance you are pregnant?" "When was your last menstrual period?"       No pregnancy and no menstrual cycle 11. TRAVEL: "Have you traveled out of the country in the last month?" (e.g., travel history, exposures)       No  Protocols used: BREATHING DIFFICULTY-A-AH

## 2018-03-02 NOTE — ED Notes (Signed)
Pt assisted up to room commode and then back to stretcher; PO sprite, graham crackers & peanut butter given to pt at request and MD agreement

## 2018-03-02 NOTE — ED Notes (Signed)
Pt uprite on stretcher in exam room with no distress noted; denies any c/o at present; Dr Joni Fears at bedside for rectal exam; pt tolerated well MD reports guaic negative; pt & family voice good understanding of plan of care

## 2018-03-02 NOTE — ED Notes (Signed)
Pt taken to xray 
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
## 2018-03-02 NOTE — ED Triage Notes (Addendum)
Pt states that she has been coughing and congested at night, states that she has been well until today as she was walking down the hall she became sob. Pt reports a little sob while seated in triage, pt states at her last visit she was diagnosed with having chf. Pt reports dark stools, states that she is scheduled to have a blood transfusion in the am due to her last hgb of 6.8, pt does appear pale in triage

## 2018-03-02 NOTE — Telephone Encounter (Signed)
Left message on vm for [facility] with [facility], notifying him Dr. Danise Mina agrees with PT order.

## 2018-03-03 ENCOUNTER — Telehealth: Payer: Self-pay

## 2018-03-03 ENCOUNTER — Observation Stay
Admission: EM | Admit: 2018-03-03 | Discharge: 2018-03-06 | Disposition: A | Discharge status: Home / Self Care | Payer: 59 | Attending: Internal Medicine | Admitting: Internal Medicine

## 2018-03-03 ENCOUNTER — Other Ambulatory Visit: Payer: Self-pay

## 2018-03-03 ENCOUNTER — Ambulatory Visit
Admission: RE | Admit: 2018-03-03 | Discharge: 2018-03-03 | Disposition: A | Discharge status: Home / Self Care | Payer: 59 | Source: Ambulatory Visit | Attending: Nephrology | Admitting: Nephrology

## 2018-03-03 ENCOUNTER — Emergency Department: Payer: 59

## 2018-03-03 DIAGNOSIS — Z79899 Other long term (current) drug therapy: Secondary | ICD-10-CM | POA: Diagnosis not present

## 2018-03-03 DIAGNOSIS — Z9012 Acquired absence of left breast and nipple: Secondary | ICD-10-CM | POA: Diagnosis not present

## 2018-03-03 DIAGNOSIS — M329 Systemic lupus erythematosus, unspecified: Secondary | ICD-10-CM | POA: Diagnosis present

## 2018-03-03 DIAGNOSIS — Z853 Personal history of malignant neoplasm of breast: Secondary | ICD-10-CM | POA: Insufficient documentation

## 2018-03-03 DIAGNOSIS — M3214 Glomerular disease in systemic lupus erythematosus: Secondary | ICD-10-CM | POA: Insufficient documentation

## 2018-03-03 DIAGNOSIS — N183 Chronic kidney disease, stage 3 (moderate): Secondary | ICD-10-CM | POA: Diagnosis not present

## 2018-03-03 DIAGNOSIS — E669 Obesity, unspecified: Secondary | ICD-10-CM | POA: Insufficient documentation

## 2018-03-03 DIAGNOSIS — IMO0002 Reserved for concepts with insufficient information to code with codable children: Secondary | ICD-10-CM | POA: Diagnosis present

## 2018-03-03 DIAGNOSIS — D561 Beta thalassemia: Secondary | ICD-10-CM | POA: Insufficient documentation

## 2018-03-03 DIAGNOSIS — R0602 Shortness of breath: Secondary | ICD-10-CM

## 2018-03-03 DIAGNOSIS — R079 Chest pain, unspecified: Secondary | ICD-10-CM | POA: Diagnosis not present

## 2018-03-03 DIAGNOSIS — J810 Acute pulmonary edema: Secondary | ICD-10-CM

## 2018-03-03 DIAGNOSIS — Z6831 Body mass index (BMI) 31.0-31.9, adult: Secondary | ICD-10-CM | POA: Insufficient documentation

## 2018-03-03 DIAGNOSIS — Z923 Personal history of irradiation: Secondary | ICD-10-CM | POA: Insufficient documentation

## 2018-03-03 DIAGNOSIS — C50912 Malignant neoplasm of unspecified site of left female breast: Secondary | ICD-10-CM

## 2018-03-03 DIAGNOSIS — N179 Acute kidney failure, unspecified: Secondary | ICD-10-CM | POA: Diagnosis present

## 2018-03-03 DIAGNOSIS — Z9221 Personal history of antineoplastic chemotherapy: Secondary | ICD-10-CM | POA: Insufficient documentation

## 2018-03-03 DIAGNOSIS — I429 Cardiomyopathy, unspecified: Secondary | ICD-10-CM | POA: Diagnosis not present

## 2018-03-03 DIAGNOSIS — I110 Hypertensive heart disease with heart failure: Principal | ICD-10-CM | POA: Insufficient documentation

## 2018-03-03 DIAGNOSIS — C50512 Malignant neoplasm of lower-outer quadrant of left female breast: Secondary | ICD-10-CM

## 2018-03-03 DIAGNOSIS — I10 Essential (primary) hypertension: Secondary | ICD-10-CM | POA: Diagnosis present

## 2018-03-03 DIAGNOSIS — L8990 Pressure ulcer of unspecified site, unspecified stage: Secondary | ICD-10-CM

## 2018-03-03 DIAGNOSIS — I5043 Acute on chronic combined systolic (congestive) and diastolic (congestive) heart failure: Secondary | ICD-10-CM | POA: Diagnosis not present

## 2018-03-03 DIAGNOSIS — D631 Anemia in chronic kidney disease: Secondary | ICD-10-CM | POA: Diagnosis not present

## 2018-03-03 DIAGNOSIS — N189 Chronic kidney disease, unspecified: Secondary | ICD-10-CM

## 2018-03-03 DIAGNOSIS — Z170 Estrogen receptor positive status [ER+]: Secondary | ICD-10-CM

## 2018-03-03 DIAGNOSIS — E559 Vitamin D deficiency, unspecified: Secondary | ICD-10-CM | POA: Diagnosis not present

## 2018-03-03 LAB — PREPARE RBC (CROSSMATCH)

## 2018-03-03 LAB — HEMOGLOBIN AND HEMATOCRIT, BLOOD
HCT: 23.2 % — ABNORMAL LOW (ref 35.0–47.0)
Hemoglobin: 7.4 g/dL — ABNORMAL LOW (ref 12.0–16.0)

## 2018-03-03 MED ORDER — FUROSEMIDE 10 MG/ML IJ SOLN
40.0000 mg | Freq: Once | INTRAMUSCULAR | Status: AC
  Administered 2018-03-04: 40 mg via INTRAVENOUS
  Filled 2018-03-03: qty 4

## 2018-03-03 MED ORDER — SODIUM CHLORIDE 0.9 % IV SOLN: Freq: Once | INTRAVENOUS | Status: DC

## 2018-03-03 NOTE — Assessment & Plan Note (Addendum)
Chronic, stable. Continue current regimen including losartan, metoprolol XL, metolazone daily and lasix 80mg  daily.

## 2018-03-03 NOTE — Assessment & Plan Note (Signed)
Completed augmentin course. Dyspnea is improved but persists. Continue to monitor.

## 2018-03-03 NOTE — Assessment & Plan Note (Signed)
Pt states she had labwork last week at his office - will not check labs today and instead will request latest records from Dr Juleen China.

## 2018-03-03 NOTE — Telephone Encounter (Signed)
Noted see other phone note.

## 2018-03-03 NOTE — Telephone Encounter (Signed)
plz notify I reviewed ER visit from yesterday - how is breathing today? If ongoing shortness of breath after blood transfusion, recommend return to ER to ensure no new blood clot in lungs.

## 2018-03-03 NOTE — Telephone Encounter (Signed)
Per ED note pt left AMA.

## 2018-03-03 NOTE — ED Triage Notes (Signed)
Pt to ED via POV. Pt states that she is having shortness of breath. Pt states that this is worse than when she was here last night. Pt states that she was given a blood transfusion today and she started feeling worse after the transfusion.

## 2018-03-03 NOTE — Telephone Encounter (Signed)
Spoke with pt relaying Dr. Synthia Innocent message.  Pt verbalizes understanding and states her breathing is about the same.  But she has not had transfusion yet.

## 2018-03-03 NOTE — Assessment & Plan Note (Addendum)
Chronic, stable. Continue compression stockings and lasix 80mg  daily along with new metolazone 2.5mg  daily.

## 2018-03-03 NOTE — Assessment & Plan Note (Addendum)
Continues hydrochloroquine 200mg  daily, cellcept 500mg  bid, and prednisone 10mg  daily. Unclear plan for prednisone so I refilled this last month - would like input from rheum about prednisone plan, she has f/u with Dr Jefm Bryant later this summer

## 2018-03-03 NOTE — Telephone Encounter (Signed)
PLEASE NOTE: All timestamps contained within this report are represented as [place] Standard Time. CONFIDENTIALTY NOTICE: This fax transmission is intended only for the addressee. It contains information that is legally privileged, confidential or otherwise protected from use or disclosure. If you are not the intended recipient, you are strictly prohibited from reviewing, disclosing, copying using or disseminating any of this information or taking any action in reliance on or regarding this information. If you have received this fax in error, please notify us immediately by telephone so that we can arrange for its return to [place]. Phone: [phone], Toll-Free: [phone], Fax: [phone] Page: 1 of 1 Call Id: [id number] [place] Night - Client Nonclinical Telephone Record [place] Night - Client Client Site [place] Physician Ria Bush - MD Contact Type Call Who Is Calling Physician / Provider / Hospital Call Type Provider Call [facility] Page Now Reason for Call Request to Admit or Transfer Patient Initial Comment Caller states they are needing to speak to the on call doctor for a mutual patient to be admit a patient to the hospital. Additional Comment Patient Name Melanie Holloway Patient DOB 1966/08/[address]Stafford Physician Number [phone] Facility Name [facility] Phone DateTime Result/Outcome Message Type Notes Deborra Medina - MD [phone] 03/02/2018 8:14:29 PM Called On Call Provider - Reached Doctor Paged Deborra Medina - MD 03/02/2018 8:15:06 PM Spoke with On Call - General Message Result Spoke with On Call, information provided and warm conference the call. Call Closed By: Delton Coombes Transaction Date/Time: 03/02/2018 8:07:26 PM (ET)

## 2018-03-03 NOTE — ED Notes (Signed)
Unable to gain IV access for blood specimens, IV team order initiated

## 2018-03-03 NOTE — Assessment & Plan Note (Signed)
EF dropped from 40% to 20% in last 3 months, unclear cause. ?lupus, ?med related (hydroxychloroquine). Will also request rheum input for this. She has established with CHF clinic - upcoming appt next week. Zaroxolyn 2.5mg  was recently added to daily lasix 80mg , she had labs with renal last week and has close f/u for repeat labs. Will request renal records today.
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
## 2018-03-03 NOTE — ED Notes (Signed)
Patient transported to X-ray 

## 2018-03-04 ENCOUNTER — Observation Stay: Payer: 59

## 2018-03-04 DIAGNOSIS — I10 Essential (primary) hypertension: Secondary | ICD-10-CM | POA: Diagnosis not present

## 2018-03-04 DIAGNOSIS — N179 Acute kidney failure, unspecified: Secondary | ICD-10-CM | POA: Diagnosis present

## 2018-03-04 DIAGNOSIS — I5043 Acute on chronic combined systolic (congestive) and diastolic (congestive) heart failure: Secondary | ICD-10-CM | POA: Diagnosis not present

## 2018-03-04 DIAGNOSIS — R0602 Shortness of breath: Secondary | ICD-10-CM | POA: Diagnosis not present

## 2018-03-04 DIAGNOSIS — I5020 Unspecified systolic (congestive) heart failure: Secondary | ICD-10-CM | POA: Diagnosis not present

## 2018-03-04 DIAGNOSIS — L8990 Pressure ulcer of unspecified site, unspecified stage: Secondary | ICD-10-CM

## 2018-03-04 DIAGNOSIS — N189 Chronic kidney disease, unspecified: Secondary | ICD-10-CM

## 2018-03-04 LAB — BLOOD GAS, VENOUS
Acid-base deficit: 6.4 mmol/L — ABNORMAL HIGH (ref 0.0–2.0)
Bicarbonate: 16.9 mmol/L — ABNORMAL LOW (ref 20.0–28.0)
O2 Saturation: 70.4 %
PCO2 VEN: 26 mmHg — AB (ref 44.0–60.0)
Patient temperature: 37
pH, Ven: 7.42 (ref 7.250–7.430)
pO2, Ven: 36 mmHg (ref 32.0–45.0)

## 2018-03-04 LAB — COMPREHENSIVE METABOLIC PANEL
ALT: 101 U/L — AB (ref 14–54)
AST: 113 U/L — AB (ref 15–41)
Albumin: 3 g/dL — ABNORMAL LOW (ref 3.5–5.0)
Alkaline Phosphatase: 384 U/L — ABNORMAL HIGH (ref 38–126)
Anion gap: 15 (ref 5–15)
BUN: 67 mg/dL — AB (ref 6–20)
CO2: 17 mmol/L — AB (ref 22–32)
Calcium: 8.9 mg/dL (ref 8.9–10.3)
Chloride: 105 mmol/L (ref 101–111)
Creatinine, Ser: 2.54 mg/dL — ABNORMAL HIGH (ref 0.44–1.00)
GFR calc Af Amer: 24 mL/min — ABNORMAL LOW (ref >60)
GFR calc non Af Amer: 20 mL/min — ABNORMAL LOW (ref >60)
GLUCOSE: 123 mg/dL — AB (ref 65–99)
POTASSIUM: 4 mmol/L (ref 3.5–5.1)
Sodium: 137 mmol/L (ref 135–145)
Total Bilirubin: 2.5 mg/dL — ABNORMAL HIGH (ref 0.3–1.2)
Total Protein: 7.4 g/dL (ref 6.5–8.1)

## 2018-03-04 LAB — CBC WITH DIFFERENTIAL/PLATELET
BAND NEUTROPHILS: 0 %
BASOS ABS: 0 10*3/uL (ref 0–0.1)
Basophils Relative: 0 %
Blasts: 0 %
EOS ABS: 0 10*3/uL (ref 0–0.7)
EOS PCT: 0 %
HCT: 29.8 % — ABNORMAL LOW (ref 35.0–47.0)
Hemoglobin: 9.9 g/dL — ABNORMAL LOW (ref 12.0–16.0)
Lymphocytes Relative: 24 %
Lymphs Abs: 1.5 10*3/uL (ref 1.0–3.6)
MCH: 30.4 pg (ref 26.0–34.0)
MCHC: 33.4 g/dL (ref 32.0–36.0)
MCV: 91 fL (ref 80.0–100.0)
METAMYELOCYTES PCT: 0 %
MONO ABS: 0.4 10*3/uL (ref 0.2–0.9)
MONOS PCT: 6 %
MYELOCYTES: 0 %
NEUTROS ABS: 4.5 10*3/uL (ref 1.4–6.5)
Neutrophils Relative %: 70 %
Other: 0 %
PLATELETS: 276 10*3/uL (ref 150–440)
Promyelocytes Relative: 0 %
RBC: 3.27 MIL/uL — ABNORMAL LOW (ref 3.80–5.20)
RDW: 17.6 % — AB (ref 11.5–14.5)
WBC: 6.4 10*3/uL (ref 3.6–11.0)
nRBC: 7 /100 WBC — ABNORMAL HIGH

## 2018-03-04 LAB — TYPE AND SCREEN
ABO/RH(D): B POS
ANTIBODY SCREEN: NEGATIVE
Unit division: 0

## 2018-03-04 LAB — BPAM RBC
Blood Product Expiration Date: 201905232359
ISSUE DATE / TIME: 201905161153
UNIT TYPE AND RH: 1700

## 2018-03-04 LAB — FIBRIN DERIVATIVES D-DIMER (ARMC ONLY): FIBRIN DERIVATIVES D-DIMER (ARMC): 847.48 ng{FEU}/mL — AB (ref 0.00–499.00)

## 2018-03-04 LAB — TROPONIN I: Troponin I: 0.06 ng/mL (ref <0.03)

## 2018-03-04 LAB — BRAIN NATRIURETIC PEPTIDE

## 2018-03-04 MED ORDER — TECHNETIUM TO 99M ALBUMIN AGGREGATED: 4.0000 | Freq: Once | INTRAVENOUS | Status: DC | PRN

## 2018-03-04 MED ORDER — ONDANSETRON HCL 4 MG/2ML IJ SOLN: 4.0000 mg | Freq: Four times a day (QID) | INTRAMUSCULAR | Status: DC | PRN

## 2018-03-04 MED ORDER — FUROSEMIDE 40 MG PO TABS
80.0000 mg | ORAL_TABLET | Freq: Every day | ORAL | Status: DC
  Administered 2018-03-04 – 2018-03-05 (×2): 80 mg via ORAL
  Filled 2018-03-04 (×2): qty 2

## 2018-03-04 MED ORDER — ACETAMINOPHEN 325 MG PO TABS
650.0000 mg | ORAL_TABLET | Freq: Four times a day (QID) | ORAL | Status: DC | PRN
  Administered 2018-03-06: 650 mg via ORAL
  Filled 2018-03-04 (×2): qty 2

## 2018-03-04 MED ORDER — HYDROXYCHLOROQUINE SULFATE 200 MG PO TABS
200.0000 mg | ORAL_TABLET | Freq: Every day | ORAL | Status: DC
  Administered 2018-03-04 – 2018-03-06 (×3): 200 mg via ORAL
  Filled 2018-03-04 (×3): qty 1

## 2018-03-04 MED ORDER — PREDNISONE 10 MG PO TABS
10.0000 mg | ORAL_TABLET | Freq: Every day | ORAL | Status: DC
  Administered 2018-03-04 – 2018-03-06 (×3): 10 mg via ORAL
  Filled 2018-03-04 (×3): qty 1

## 2018-03-04 MED ORDER — MYCOPHENOLATE MOFETIL 250 MG PO CAPS
500.0000 mg | ORAL_CAPSULE | Freq: Two times a day (BID) | ORAL | Status: DC
  Administered 2018-03-04 – 2018-03-06 (×5): 500 mg via ORAL
  Filled 2018-03-04 (×8): qty 2

## 2018-03-04 MED ORDER — HEPARIN SODIUM (PORCINE) 5000 UNIT/ML IJ SOLN
5000.0000 [IU] | Freq: Three times a day (TID) | INTRAMUSCULAR | Status: DC
  Administered 2018-03-04 – 2018-03-06 (×7): 5000 [IU] via SUBCUTANEOUS
  Filled 2018-03-04 (×7): qty 1

## 2018-03-04 MED ORDER — TECHNETIUM TC 99M DIETHYLENETRIAME-PENTAACETIC ACID: 30.0000 | Freq: Once | INTRAVENOUS | Status: DC | PRN

## 2018-03-04 MED ORDER — METOPROLOL SUCCINATE ER 50 MG PO TB24
50.0000 mg | ORAL_TABLET | Freq: Every day | ORAL | Status: DC
  Administered 2018-03-04 – 2018-03-06 (×3): 50 mg via ORAL
  Filled 2018-03-04 (×3): qty 1

## 2018-03-04 MED ORDER — ACETAMINOPHEN 650 MG RE SUPP: 650.0000 mg | Freq: Four times a day (QID) | RECTAL | Status: DC | PRN

## 2018-03-04 MED ORDER — FUROSEMIDE 10 MG/ML IJ SOLN
40.0000 mg | Freq: Once | INTRAMUSCULAR | Status: AC
  Administered 2018-03-04: 40 mg via INTRAVENOUS
  Filled 2018-03-04: qty 4

## 2018-03-04 MED ORDER — LORAZEPAM 2 MG/ML IJ SOLN
1.0000 mg | Freq: Once | INTRAMUSCULAR | Status: AC
  Administered 2018-03-04: 1 mg via INTRAVENOUS
  Filled 2018-03-04: qty 1

## 2018-03-04 MED ORDER — LETROZOLE 2.5 MG PO TABS
2.5000 mg | ORAL_TABLET | Freq: Every day | ORAL | Status: DC
  Administered 2018-03-04 – 2018-03-06 (×3): 2.5 mg via ORAL
  Filled 2018-03-04 (×3): qty 1

## 2018-03-04 MED ORDER — IPRATROPIUM-ALBUTEROL 0.5-2.5 (3) MG/3ML IN SOLN
3.0000 mL | RESPIRATORY_TRACT | Status: DC | PRN
  Administered 2018-03-04: 3 mL via RESPIRATORY_TRACT
  Filled 2018-03-04: qty 3

## 2018-03-04 MED ORDER — ONDANSETRON HCL 4 MG PO TABS: 4.0000 mg | ORAL_TABLET | Freq: Four times a day (QID) | ORAL | Status: DC | PRN

## 2018-03-04 NOTE — ED Notes (Signed)
Pt requesting to have foley due to lasix, risks discussed with pt about foley however pt states she still wants foley and denies external female catheter, EDP notified, verbal order for foley.

## 2018-03-04 NOTE — Progress Notes (Signed)
[facility] [place] NAME: Melanie Holloway    MR#:  [id number]  DATE OF BIRTH:  January 17, 1966  SUBJECTIVE:  patient reports feeling better than yesterday. She left against medical advice from the emergency room two days ago. Came in with increasing shortness of breath and leg edema  REVIEW OF SYSTEMS:   Review of Systems  Constitutional: Negative for chills, fever and weight loss.  HENT: Negative for ear discharge, ear pain and nosebleeds.   Eyes: Negative for blurred vision, pain and discharge.  Respiratory: Positive for shortness of breath. Negative for sputum production, wheezing and stridor.   Cardiovascular: Positive for leg swelling. Negative for chest pain, palpitations, orthopnea and PND.  Gastrointestinal: Negative for abdominal pain, diarrhea, nausea and vomiting.  Genitourinary: Negative for frequency and urgency.  Musculoskeletal: Negative for back pain and joint pain.  Neurological: Negative for sensory change, speech change, focal weakness and weakness.  Psychiatric/Behavioral: Negative for depression and hallucinations. The patient is not nervous/anxious.    Tolerating Diet:yes Tolerating PT: ambulatory  DRUG ALLERGIES:  No Known Allergies  VITALS:  Blood pressure (!) 134/92, pulse 87, temperature 97.9 F (36.6 C), temperature source Oral, resp. rate (!) 22, height 4\' 11"  (1.499 m), weight 65.9 kg (145 lb 4.8 oz), last menstrual period 03/04/2009, SpO2 97 %.  PHYSICAL EXAMINATION:   Physical Exam  GENERAL:  52 y.o.-year-old patient lying in the bed with no acute distress. pallor EYES: Pupils equal, round, reactive to light and accommodation. No scleral icterus. Extraocular muscles intact.  HEENT: Head atraumatic, normocephalic. Oropharynx and nasopharynx clear.  NECK:  Supple, no jugular venous distention. No thyroid enlargement, no tenderness.  LUNGS:decreased  breath sounds bilaterally, no wheezing, rales, rhonchi.  No use of accessory muscles of respiration.  CARDIOVASCULAR: S1, S2 normal. No murmurs, rubs, or gallops.  ABDOMEN: Soft, nontender, nondistended. Bowel sounds present. No organomegaly or mass.  EXTREMITIES: No cyanosis, clubbing  ++ edema b/l.    NEUROLOGIC: Cranial nerves II through XII are intact. No focal Motor or sensory deficits b/l.   PSYCHIATRIC:  patient is alert and oriented x 3.  SKIN: No obvious rash, lesion, or ulcer.   LABORATORY PANEL:  CBC Recent Labs  Lab 03/04/18 0124  WBC 6.4  HGB 9.9*  HCT 29.8*  PLT 276    Chemistries  Recent Labs  Lab 03/04/18 0124  NA 137  K 4.0  CL 105  CO2 17*  GLUCOSE 123*  BUN 67*  CREATININE 2.54*  CALCIUM 8.9  AST 113*  ALT 101*  ALKPHOS 384*  BILITOT 2.5*   Cardiac Enzymes Recent Labs  Lab 03/04/18 0124  TROPONINI 0.06*   RADIOLOGY:  Dg Chest 2 View  Result Date: 03/03/2018 CLINICAL DATA:  Short of breath EXAM: CHEST - 2 VIEW COMPARISON:  03/02/2018, 02/15/2018 FINDINGS: Cardiomegaly with vascular congestion and mild pulmonary edema. Small pleural effusions. Bibasilar consolidations. Aortic atherosclerosis. No pneumothorax. IMPRESSION: No significant interval change in cardiomegaly, vascular congestion and mild pulmonary edema. Small pleural effusions with bibasilar atelectasis or pneumonia, not much changed compared to yesterday's radiograph Electronically Signed   By: Donavan Foil M.D.   On: 03/03/2018 23:18   Dg Chest 2 View  Result Date: 03/02/2018 CLINICAL DATA:  Shortness of breath EXAM: CHEST - 2 VIEW COMPARISON:  02/15/2018, 02/13/2018, 02/11/2018, 11/18/2017, CT chest 11/29/2017 FINDINGS: Small pleural effusions. Removal of right upper extremity catheter. Cardiomegaly with vascular congestion and mild perihilar edema. Slight worsening of bilateral lower lung  airspace disease since most recent prior. No pneumothorax. Aortic atherosclerosis. Surgical clips in the left axillary region. IMPRESSION: 1. Removal of  right upper extremity catheter 2. Cardiomegaly with vascular congestion and mild perihilar edema 3. Persistent small pleural effusions. Slight worsening of bibasilar airspace disease, atelectasis versus pneumonia Electronically Signed   By: Donavan Foil M.D.   On: 03/02/2018 19:22   Nm Pulmonary Perf And Vent  Result Date: 03/04/2018 CLINICAL DATA:  Shortness of breath EXAM: NUCLEAR MEDICINE VENTILATION - PERFUSION LUNG SCAN VIEWS: Anterior, posterior-ventilation and perfusion. Patient could not tolerate additional imaging. RADIOPHARMACEUTICALS:  30.430 mCi of Tc-37m DTPA aerosol inhalation and 4.318 mCi Tc81m-MAA IV COMPARISON:  Chest radiograph Mar 03, 2018 FINDINGS: Ventilation: Radiotracer uptake appears homogeneous and symmetric bilaterally. No ventilation defects are identified. Perfusion: Radiotracer uptake on the perfusion study is homogeneous and symmetric bilaterally. No appreciable perfusion defects. Cardiomegaly is noted. IMPRESSION: No appreciable ventilation or perfusion defects evident. While only anterior and posterior projections are evident, the lack of abnormality on these images suggests that this study constitutes a very low probability of pulmonary embolus. Cardiomegaly is noted. Electronically Signed   By: Lowella Grip III M.D.   On: 03/04/2018 12:34   ASSESSMENT AND PLAN:  Melanie Holloway  is a 52 y.o. female who presents with several days of progressive shortness of breath.  Patient was seen in her ED yesterday and had an elevated d-dimer with some concern by ED physician for possible PE.  Patient refused to stay at that time citing that she needed to receive a transfusion.  Patient has beta thalassemia and lupus at baseline.  She came back tonight for progression of the same symptoms.  She states she is also had increased lower extremity edema.  Patient does have a history of significant severe heart failure, with an EF around 20%  # Acute on chronic combined systolic and  diastolic CHF (congestive heart failure) (HCC)  -IV Lasix  , monitor I and O and watch creatinine -cardiology consult appreciate  #  Essential hypertension -stable, continue home meds   # Malignant neoplasm of lower-outer quadrant of left breast of female, estrogen receptor positive ([place]) -home dose letrozole  #  Acute renal failure superimposed on chronic kidney disease -creatinine is elevated from even just a couple of days ago.  - We will avoid nephrotoxins as much as possible, however the patient does need diuresis -see above -nephrology consultation placed with Dr. Juleen China case discussed.  #Anemia of chronic disease. -Had hemoglobin of 7.4-- unit of blood transfusion as outpatient--- 9.8  #  Systemic lupus erythematosus ([place]) -home dose immunomodulators   Case discussed with Care Management/Social Worker. Management plans discussed with the patient, family and they are in agreement.  CODE STATUS: full  DVT Prophylaxis: ScD  TOTAL TIME TAKING CARE OF THIS PATIENT: 25 minutes.  >50% time spent on counselling and coordination of care  POSSIBLE D/C IN 1-2 DAYS, DEPENDING ON CLINICAL CONDITION.  Note: This dictation was prepared with Dragon dictation along with smaller phrase technology. Any transcriptional errors that result from this process are unintentional.  Fritzi Mandes M.D on 03/04/2018 at 2:44 PM  Between 7am to 6pm - Pager - [phone]  After 6pm go to [email or web address] - password [email or web address] [facility]  Office  [phone]  CC: Primary care physician; Ria Bush, MDPatient ID: Deeann Saint, female   DOB: [birth year], 52 y.o.   MRN: [id number]

## 2018-03-04 NOTE — ED Notes (Signed)
Recollect of all blood specimens being sent to lab

## 2018-03-04 NOTE — ED Notes (Addendum)
Pt assisted to bathroom in rm, pt voided and had soft BM.

## 2018-03-04 NOTE — Plan of Care (Signed)
  Problem: Clinical Measurements: Goal: Ability to maintain clinical measurements within normal limits will improve Outcome: Not Progressing Note:  BUN level today is elevated at 67. Will continue to monitor renal function. Melanie Holloway [facility]

## 2018-03-04 NOTE — ED Notes (Signed)
Pt readjusted in the bed, pt sitting on side of bed lying over bedside table with pillow under her head, pt states this is more comfortable for her breathing than lying in bed.
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
## 2018-03-04 NOTE — Progress Notes (Addendum)
Cardiovascular and Pulmonary Nurse Navigator Note:    52 year old female who presented to the ED on 03/03/2018 with c/o several days of progressive SOB and increased lower extremity edema.  Patient with hx of beta thalassemia, lupus, anemia, CHF with EF around 20%.  W/U in the ED was consistent for HF exacerbation, pulmonary edema, and pleural effusions on CXR.    Active Problems this admission:   Acute on chronic combined systolic and diastolic CHF HTN Malignant Neoplasm of lower outer quadrant of left breast Acute renal failure on Chronic Kidney Disease  Systemic Lupus Erythematosus   NOTE:  Patient with 4 admissions within the last 6 months.    Attempted to round on patient to re-educate patient on CHF. Note:  This RN had rounded on patient on 02/15/2018 which was during her last admission.   Patient not ready to re-learn / review at this time.  Patient in bed holding head in hand while leaning over the overbed table while her family member rubbed her back.  This RN quietly left the room.   Patient has a follow-up appointment in the [facility] on 03/10/2018 at 11:00 a.m.    Roanna Epley, RN, BSN, [facility] Cardiovascular and Pulmonary Nurse Navigator

## 2018-03-04 NOTE — ED Notes (Signed)
Dr. Willis at bedside  

## 2018-03-04 NOTE — Progress Notes (Signed)
Earlier today the patient told me "lab can come back in". I assumed she meant that the lab tech didn't want to interrupt the physician who was speaking to the patient at the time. However, I later get a call from another lab tech saying that that wasn't the case and actually the patient had been refusing lab draws (for how long I'm not sure). Will continue to monitor. Melanie Holloway [facility]

## 2018-03-04 NOTE — ED Notes (Signed)
IV team with pt.

## 2018-03-04 NOTE — Care Management Note (Signed)
Case Management Note  Patient Details  Name: Melanie Holloway MRN: [id number] Date of Birth: 06/08/1966  Subjective/Objective:  Admitted to [facility] under observation with the diagnosis of CHF. Lives with spouse [name] [phone]). Last seen Dr. Danise Mina 03/01/18.                   Action/Plan: Re-admit. [facility] arranged for services in the when discharged 02/15/18. Spoke with Floydene Flock, [facility] representative. States that Dr. Danise Mina hasn't signed the previous home health orders from last admission. Go ahead and get resumption of orders at discharge this admission   Expected Discharge Date:  03/05/18               Expected Discharge Plan:     In-House Referral:     Discharge planning Services     Post Acute Care Choice:    Choice offered to:     DME Arranged:    DME Agency:     HH Arranged:   yes HH Agency:   [place]  Status of Service:     If discussed at [place] of Stay Meetings, dates discussed:    Additional Comments:  Shelbie Ammons, RN MSN CCM Care Management [phone] 03/04/2018, 11:17 AM

## 2018-03-04 NOTE — Care Management Obs Status (Signed)
[place] NOTIFICATION   Patient Details  Name: Melanie Holloway MRN: [id number] Date of Birth: 22-Jul-1966   Medicare Observation Status Notification Given:  Yes    Shelbie Ammons, RN 03/04/2018, 11:24 AM

## 2018-03-04 NOTE — Progress Notes (Addendum)
Pt is complaining of SOB and asking if she can have breathing treatment. Page prime. Will continue to monitor.  Update 2021: Docotor Chen place an order fpr Duoneb 3 ml every 4 hours for SOB and Wheezing. Will continue to monitor.  Update 2128: Pt is requesting fan. Page prime. Awaiting callback. Will continue to monitor.  Update: 2129. Doctor Place an order for fan. Will continue to monitor.

## 2018-03-04 NOTE — ED Notes (Signed)
This tech transporting pt to room 241 at this time.

## 2018-03-05 DIAGNOSIS — E875 Hyperkalemia: Secondary | ICD-10-CM | POA: Diagnosis not present

## 2018-03-05 DIAGNOSIS — N179 Acute kidney failure, unspecified: Secondary | ICD-10-CM | POA: Diagnosis not present

## 2018-03-05 DIAGNOSIS — I5043 Acute on chronic combined systolic (congestive) and diastolic (congestive) heart failure: Secondary | ICD-10-CM | POA: Diagnosis not present

## 2018-03-05 DIAGNOSIS — I10 Essential (primary) hypertension: Secondary | ICD-10-CM | POA: Diagnosis not present

## 2018-03-05 DIAGNOSIS — I129 Hypertensive chronic kidney disease with stage 1 through stage 4 chronic kidney disease, or unspecified chronic kidney disease: Secondary | ICD-10-CM | POA: Diagnosis not present

## 2018-03-05 MED ORDER — TORSEMIDE 20 MG PO TABS
40.0000 mg | ORAL_TABLET | Freq: Two times a day (BID) | ORAL | Status: DC
  Administered 2018-03-05 – 2018-03-06 (×2): 40 mg via ORAL
  Filled 2018-03-05 (×2): qty 2

## 2018-03-05 NOTE — Plan of Care (Signed)

## 2018-03-05 NOTE — Progress Notes (Signed)
[facility]  ROUNDING NOTE   Subjective:   [name] admitted to [facility] on 03/03/2018 for SOB (shortness of breath) [R06.02]   Patient received 1 unit PRBC on 5/16. This did help but continued to have shortness of breath and edema.   Last week had a trial of metolazone but this gave blurry vision. Current outpatient diuretic therapy of furosemide 40mg  bid. With no improvement.   Creatinine of 2.54 (1.87)  Objective:  Vital signs in last 24 hours:  Pulse Rate:  [78-97] 78 (05/18 0813) Resp:  [16-24] 16 (05/18 0813) BP: (124-143)/(92-107) 124/93 (05/18 0813) SpO2:  [95 %-100 %] 100 % (05/18 0813) Weight:  [69.5 kg (153 lb 3.2 oz)] 69.5 kg (153 lb 3.2 oz) (05/18 0548)  Weight change: 3.583 kg (7 lb 14.4 oz) Filed Weights   03/04/18 0353 03/05/18 0548  Weight: 65.9 kg (145 lb 4.8 oz) 69.5 kg (153 lb 3.2 oz)    Intake/Output: I/O last 3 completed shifts: In: 360 [P.O.:360] Out: 352 [Urine:350; Stool:2]   Intake/Output this shift:  Total I/O In: 240 [P.O.:240] Out: 0   Physical Exam: General: NAD,   Head: Normocephalic, atraumatic. Moist oral mucosal membranes  Eyes: Anicteric, PERRL  Neck: Supple, trachea midline  Lungs:  Clear to auscultation  Heart: Regular rate and rhythm  Abdomen:  Soft, nontender,   Extremities: + peripheral edema.  Neurologic: Nonfocal, moving all four extremities  Skin: No lesions        Basic Metabolic Panel: Recent Labs  Lab 03/02/18 1750 03/04/18 0124  NA 138 137  K 3.4* 4.0  CL 108 105  CO2 18* 17*  GLUCOSE 129* 123*  BUN 56* 67*  CREATININE 1.87* 2.54*  CALCIUM 8.6* 8.9    Liver Function Tests: Recent Labs  Lab 03/02/18 1750 03/04/18 0124  AST 76* 113*  ALT 69* 101*  ALKPHOS 269* 384*  BILITOT 0.6 2.5*  PROT 7.1 7.4  ALBUMIN 2.9* 3.0*   No results for input(s): LIPASE, AMYLASE in the last 168 hours. No results for input(s): AMMONIA in the last 168 hours.  CBC: Recent Labs  Lab 03/02/18 1750  03/03/18 0847 03/04/18 0124  WBC 4.6  --  6.4  NEUTROABS  --   --  4.5  HGB 7.8* 7.4* 9.9*  HCT 23.5* 23.2* 29.8*  MCV 89.2  --  91.0  PLT 269  --  276    Cardiac Enzymes: Recent Labs  Lab 03/02/18 1750 03/04/18 0124  TROPONINI 0.04* 0.06*    BNP: Invalid input(s): POCBNP  CBG: No results for input(s): GLUCAP in the last 168 hours.  Microbiology: Results for orders placed or performed during the hospital encounter of 02/09/18  MRSA PCR Screening     Status: None   Collection Time: 02/10/18  4:28 AM  Result Value Ref Range Status   MRSA by PCR NEGATIVE NEGATIVE Final    Comment:        The GeneXpert MRSA Assay (FDA approved for NASAL specimens only), is one component of a comprehensive MRSA colonization surveillance program. It is not intended to diagnose MRSA infection nor to guide or monitor treatment for MRSA infections. Performed at [facility], [address] [place], [place] [address]     Coagulation Studies: No results for input(s): LABPROT, INR in the last 72 hours.  Urinalysis: No results for input(s): COLORURINE, LABSPEC, PHURINE, GLUCOSEU, HGBUR, BILIRUBINUR, KETONESUR, PROTEINUR, UROBILINOGEN, NITRITE, LEUKOCYTESUR in the last 72 hours.  Invalid input(s): APPERANCEUR  Imaging: Dg Chest 2 View  Result Date: 03/03/2018 CLINICAL DATA:  Short of breath EXAM: CHEST - 2 VIEW COMPARISON:  03/02/2018, 02/15/2018 FINDINGS: Cardiomegaly with vascular congestion and mild pulmonary edema. Small pleural effusions. Bibasilar consolidations. Aortic atherosclerosis. No pneumothorax. IMPRESSION: No significant interval change in cardiomegaly, vascular congestion and mild pulmonary edema. Small pleural effusions with bibasilar atelectasis or pneumonia, not much changed compared to yesterday's radiograph Electronically Signed   By: Donavan Foil M.D.   On: 03/03/2018 23:18   Nm Pulmonary Perf And Vent  Result Date: 03/04/2018 CLINICAL DATA:   Shortness of breath EXAM: NUCLEAR MEDICINE VENTILATION - PERFUSION LUNG SCAN VIEWS: Anterior, posterior-ventilation and perfusion. Patient could not tolerate additional imaging. RADIOPHARMACEUTICALS:  30.430 mCi of Tc-69m DTPA aerosol inhalation and 4.318 mCi Tc13m-MAA IV COMPARISON:  Chest radiograph Mar 03, 2018 FINDINGS: Ventilation: Radiotracer uptake appears homogeneous and symmetric bilaterally. No ventilation defects are identified. Perfusion: Radiotracer uptake on the perfusion study is homogeneous and symmetric bilaterally. No appreciable perfusion defects. Cardiomegaly is noted. IMPRESSION: No appreciable ventilation or perfusion defects evident. While only anterior and posterior projections are evident, the lack of abnormality on these images suggests that this study constitutes a very low probability of pulmonary embolus. Cardiomegaly is noted. Electronically Signed   By: Lowella Grip III M.D.   On: 03/04/2018 12:34     Medications:    . heparin  5,000 Units Subcutaneous Q8H  . hydroxychloroquine  200 mg Oral Daily  . letrozole  2.5 mg Oral Daily  . metoprolol succinate  50 mg Oral Daily  . mycophenolate  500 mg Oral BID  . predniSONE  10 mg Oral Daily  . torsemide  40 mg Oral BID   acetaminophen **OR** acetaminophen, ipratropium-albuterol, ondansetron **OR** ondansetron (ZOFRAN) IV, technetium albumin aggregated, technetium TC 3M diethylenetriame-pentaacetic acid  Assessment/ Plan:  [name] is a 52 y.o. black female  with lupus nephritis class V biopsy proven, SLE,systolic congestive heart failure, hypertension, anemia, vitamin D deficiency, breast cancer status post left mastectomy, chemotherapy and radiation who was admitted to [facility] from SOB (shortness of breath) [R06.02]   1. Acute renal failure on chronic kidney disease stage III. CKD secondary to lupus nephritis.  Acute renal failure from acute cardiorenal syndrome.  - Holding losartan - Discontinue furosemide  and try torsemide.  - Fluid restriction  2. Lupus Nephritis: class V membranous. with proteinuria, hematuria and ANA positive. History of normal compliments.  - Continue mycophenolate, hydroxychloroquine and prednisone - Hold losartan - Denies use of nonsteroidal anti-inflammatory agents. - Check urine studies  3. Hypertension with acute exacerbation of systolic congestive heart failure. EF 20-25%: blood pressure at goal during clinic encounter. Not a candidate for spironolactone due to hyperkalemia.  Did not tolerate metolazone - gave blurry vision - Currently holding losartan and hydrochlorothiazide - Torsemide as above.   - Continue metoprolol  4. Anemia: beta thalassemia. Hemoglobin 9.9. Status post PRBC transfusion on 5/16.    LOS: 0 Lakai Moree 5/18/201912:24 PM

## 2018-03-05 NOTE — Progress Notes (Addendum)
Pt is refusing her mycophenolate mofetil. She states that she wants to take that she take at home. Page prime. Will continue to monitor.  Update: 2014. Doctor willis talked the pt and states that the mycophenolate mofetil that she was taking at home was the same with the one we are dispensing. Pt still refused to take the medicine. Will continue to monitor.  Update: 2300. Pharmacy came to me and states that we did have the medicine (mycophenolate mofetil ) that pt take at home. Medicine was given at 2312.Will continue to monitor.  Update 2359: Pt is complaining of indigestion. Notify prime. Awaiting callback. Will continue to monitor.  Update 0013: Doctor Jannifer Franklin ordered Calcium Carbonate (TUMS) chewable tablet 400 mg by mouth twice daily and was given.

## 2018-03-05 NOTE — Plan of Care (Signed)
  Problem: Education: Goal: Knowledge of General Education information will improve Outcome: Progressing   Problem: Health Behavior/Discharge Planning: Goal: Ability to manage health-related needs will improve Outcome: Progressing   Problem: Clinical Measurements: Goal: Will remain free from infection Outcome: Progressing   Problem: Safety: Goal: Ability to remain free from injury will improve Outcome: Progressing   Problem: Education: Goal: Ability to demonstrate management of disease process will improve Outcome: Progressing   Problem: Activity: Goal: Capacity to carry out activities will improve Outcome: Progressing

## 2018-03-05 NOTE — Progress Notes (Signed)
[facility] [place] [facility] NAME: Melanie Holloway    MR#:  [id number]  DATE OF BIRTH:  1965/12/10  SUBJECTIVE:  patient reports feeling better than yesterday. She left against medical advice from the emergency room two days ago. Came in with increasing shortness of breath and leg edema  REVIEW OF SYSTEMS:   Review of Systems  Constitutional: Negative for chills, fever and weight loss.  HENT: Negative for ear discharge, ear pain and nosebleeds.   Eyes: Negative for blurred vision, pain and discharge.  Respiratory: Positive for shortness of breath. Negative for sputum production, wheezing and stridor.   Cardiovascular: Positive for leg swelling. Negative for chest pain, palpitations, orthopnea and PND.  Gastrointestinal: Negative for abdominal pain, diarrhea, nausea and vomiting.  Genitourinary: Negative for frequency and urgency.  Musculoskeletal: Negative for back pain and joint pain.  Neurological: Negative for sensory change, speech change, focal weakness and weakness.  Psychiatric/Behavioral: Negative for depression and hallucinations. The patient is not nervous/anxious.    Tolerating Diet:yes Tolerating PT: ambulatory  DRUG ALLERGIES:  No Known Allergies  VITALS:  Blood pressure (!) 124/93, pulse 78, temperature 97.9 F (36.6 C), temperature source Oral, resp. rate 16, height 4\' 11"  (1.499 m), weight 69.5 kg (153 lb 3.2 oz), last menstrual period 03/04/2009, SpO2 100 %.  PHYSICAL EXAMINATION:   Physical Exam  GENERAL:  52 y.o.-year-old patient lying in the bed with no acute distress. pallor EYES: Pupils equal, round, reactive to light and accommodation. No scleral icterus. Extraocular muscles intact.  HEENT: Head atraumatic, normocephalic. Oropharynx and nasopharynx clear.  NECK:  Supple, no jugular venous distention. No thyroid enlargement, no tenderness.  LUNGS:decreased  breath sounds bilaterally, no wheezing, rales, rhonchi. No  use of accessory muscles of respiration.  CARDIOVASCULAR: S1, S2 normal. No murmurs, rubs, or gallops.  ABDOMEN: Soft, nontender, nondistended. Bowel sounds present. No organomegaly or mass.  EXTREMITIES: No cyanosis, clubbing  ++ edema b/l.    NEUROLOGIC: Cranial nerves II through XII are intact. No focal Motor or sensory deficits b/l.   PSYCHIATRIC:  patient is alert and oriented x 3.  SKIN: No obvious rash, lesion, or ulcer.   LABORATORY PANEL:  CBC Recent Labs  Lab 03/04/18 0124  WBC 6.4  HGB 9.9*  HCT 29.8*  PLT 276    Chemistries  Recent Labs  Lab 03/04/18 0124  NA 137  K 4.0  CL 105  CO2 17*  GLUCOSE 123*  BUN 67*  CREATININE 2.54*  CALCIUM 8.9  AST 113*  ALT 101*  ALKPHOS 384*  BILITOT 2.5*   Cardiac Enzymes Recent Labs  Lab 03/04/18 0124  TROPONINI 0.06*   RADIOLOGY:  Dg Chest 2 View  Result Date: 03/03/2018 CLINICAL DATA:  Short of breath EXAM: CHEST - 2 VIEW COMPARISON:  03/02/2018, 02/15/2018 FINDINGS: Cardiomegaly with vascular congestion and mild pulmonary edema. Small pleural effusions. Bibasilar consolidations. Aortic atherosclerosis. No pneumothorax. IMPRESSION: No significant interval change in cardiomegaly, vascular congestion and mild pulmonary edema. Small pleural effusions with bibasilar atelectasis or pneumonia, not much changed compared to yesterday's radiograph Electronically Signed   By: Donavan Foil M.D.   On: 03/03/2018 23:18   Nm Pulmonary Perf And Vent  Result Date: 03/04/2018 CLINICAL DATA:  Shortness of breath EXAM: NUCLEAR MEDICINE VENTILATION - PERFUSION LUNG SCAN VIEWS: Anterior, posterior-ventilation and perfusion. Patient could not tolerate additional imaging. RADIOPHARMACEUTICALS:  30.430 mCi of Tc-93m DTPA aerosol inhalation and 4.318 mCi Tc16m-MAA IV COMPARISON:  Chest radiograph Mar 03, 2018 FINDINGS: Ventilation: Radiotracer uptake appears homogeneous and symmetric bilaterally. No ventilation defects are identified.  Perfusion: Radiotracer uptake on the perfusion study is homogeneous and symmetric bilaterally. No appreciable perfusion defects. Cardiomegaly is noted. IMPRESSION: No appreciable ventilation or perfusion defects evident. While only anterior and posterior projections are evident, the lack of abnormality on these images suggests that this study constitutes a very low probability of pulmonary embolus. Cardiomegaly is noted. Electronically Signed   By: Lowella Grip III M.D.   On: 03/04/2018 12:34   ASSESSMENT AND PLAN:  Melanie Holloway  is a 52 y.o. female who presents with several days of progressive shortness of breath.  Patient was seen in her ED yesterday and had an elevated d-dimer with some concern by ED physician for possible PE.  Patient refused to stay at that time citing that she needed to receive a transfusion.  Patient has beta thalassemia and lupus at baseline.  She came back tonight for progression of the same symptoms.  She states she is also had increased lower extremity edema.  Patient does have a history of significant severe heart failure, with an EF around 20%  # Acute on chronic combined systolic and diastolic CHF (congestive heart failure) (HCC)  -IV Lasix--- no change to PO torsemide per nephrology, monitor I and O and watch creatinine -cardiology consult appreciate  #  Essential hypertension -stable, continue home meds   # Malignant neoplasm of lower-outer quadrant of left breast of female, estrogen receptor positive ([place]) -home dose letrozole  #  Acute renal failure superimposed on chronic kidney disease -creatinine is elevated from even just a couple of days ago.  - We will avoid nephrotoxins as much as possible, however the patient does need diuresis -see above -nephrology consultation placed with Dr. Juleen China case discussed.  #Anemia of chronic disease. -Had hemoglobin of 7.4-- unit of blood transfusion as outpatient--- 9.8  #  Systemic lupus erythematosus ([place]) -home  dose immunomodulators   Case discussed with Care Management/Social Worker. Management plans discussed with the patient, family and they are in agreement.  CODE STATUS: full  DVT Prophylaxis: ScD  TOTAL TIME TAKING CARE OF THIS PATIENT: 25 minutes.  >50% time spent on counselling and coordination of care  POSSIBLE D/C IN 1-2 DAYS, DEPENDING ON CLINICAL CONDITION.  Note: This dictation was prepared with Dragon dictation along with smaller phrase technology. Any transcriptional errors that result from this process are unintentional.  Fritzi Mandes M.D on 03/05/2018 at 1:36 PM  Between 7am to 6pm - Pager - [phone]  After 6pm go to [email or web address] - password [email or web address] [facility]  Office  [phone]  CC: Primary care physician; Ria Bush, MDPatient ID: Melanie Holloway, female   DOB: [birth year], 52 y.o.   MRN: [id number]

## 2018-03-06 DIAGNOSIS — R809 Proteinuria, unspecified: Secondary | ICD-10-CM | POA: Diagnosis not present

## 2018-03-06 DIAGNOSIS — N179 Acute kidney failure, unspecified: Secondary | ICD-10-CM | POA: Diagnosis not present

## 2018-03-06 DIAGNOSIS — N183 Chronic kidney disease, stage 3 (moderate): Secondary | ICD-10-CM | POA: Diagnosis not present

## 2018-03-06 DIAGNOSIS — I10 Essential (primary) hypertension: Secondary | ICD-10-CM | POA: Diagnosis not present

## 2018-03-06 DIAGNOSIS — I5043 Acute on chronic combined systolic (congestive) and diastolic (congestive) heart failure: Secondary | ICD-10-CM | POA: Diagnosis not present

## 2018-03-06 LAB — COMPREHENSIVE METABOLIC PANEL
ALK PHOS: 351 U/L — AB (ref 38–126)
ALT: 122 U/L — ABNORMAL HIGH (ref 14–54)
AST: 104 U/L — AB (ref 15–41)
Albumin: 2.8 g/dL — ABNORMAL LOW (ref 3.5–5.0)
Anion gap: 15 (ref 5–15)
BILIRUBIN TOTAL: 1.7 mg/dL — AB (ref 0.3–1.2)
BUN: 95 mg/dL — AB (ref 6–20)
CALCIUM: 8.8 mg/dL — AB (ref 8.9–10.3)
CO2: 16 mmol/L — ABNORMAL LOW (ref 22–32)
Chloride: 106 mmol/L (ref 101–111)
Creatinine, Ser: 3.07 mg/dL — ABNORMAL HIGH (ref 0.44–1.00)
GFR calc Af Amer: 19 mL/min — ABNORMAL LOW (ref >60)
GFR calc non Af Amer: 16 mL/min — ABNORMAL LOW (ref >60)
Glucose, Bld: 109 mg/dL — ABNORMAL HIGH (ref 65–99)
POTASSIUM: 3.6 mmol/L (ref 3.5–5.1)
Sodium: 137 mmol/L (ref 135–145)
TOTAL PROTEIN: 6.4 g/dL — AB (ref 6.5–8.1)

## 2018-03-06 MED ORDER — TORSEMIDE 20 MG PO TABS: 40.0000 mg | ORAL_TABLET | Freq: Two times a day (BID) | ORAL | 0 refills | Status: AC

## 2018-03-06 MED ORDER — CALCIUM CARBONATE ANTACID 500 MG PO CHEW
400.0000 mg | CHEWABLE_TABLET | Freq: Two times a day (BID) | ORAL | Status: DC | PRN
  Administered 2018-03-06: 400 mg via ORAL
  Filled 2018-03-06: qty 2

## 2018-03-06 NOTE — Progress Notes (Signed)
[facility]  ROUNDING NOTE   Subjective:   Feeling better. Now on torsemide.   UOP 1001 recorded  Creatinine 3.07 (2.54)  Objective:  Vital signs in last 24 hours:  Temp:  [97.4 F (36.3 C)-97.8 F (36.6 C)] 97.7 F (36.5 C) (05/19 0719) Pulse Rate:  [76-79] 78 (05/19 0719) Resp:  [15-18] 18 (05/19 0719) BP: (109-126)/(83-94) 120/86 (05/19 0719) SpO2:  [100 %] 100 % (05/19 0719) Weight:  [70.6 kg (155 lb 9.6 oz)] 70.6 kg (155 lb 9.6 oz) (05/19 0517)  Weight change: 1.089 kg (2 lb 6.4 oz) Filed Weights   03/04/18 0353 03/05/18 0548 03/06/18 0517  Weight: 65.9 kg (145 lb 4.8 oz) 69.5 kg (153 lb 3.2 oz) 70.6 kg (155 lb 9.6 oz)    Intake/Output: I/O last 3 completed shifts: In: 161 [P.O.:838] Out: 0960 [Urine:1250; Stool:3]   Intake/Output this shift:  Total I/O In: 240 [P.O.:240] Out: 450 [Urine:450]  Physical Exam: General: NAD,   Head: Normocephalic, atraumatic. Moist oral mucosal membranes  Eyes: Anicteric, PERRL  Neck: Supple, trachea midline  Lungs:  Clear to auscultation  Heart: Regular rate and rhythm  Abdomen:  Soft, nontender,   Extremities: + peripheral edema.  Neurologic: Nonfocal, moving all four extremities  Skin: No lesions        Basic Metabolic Panel: Recent Labs  Lab 03/02/18 1750 03/04/18 0124 03/06/18 0541  NA 138 137 137  K 3.4* 4.0 3.6  CL 108 105 106  CO2 18* 17* 16*  GLUCOSE 129* 123* 109*  BUN 56* 67* 95*  CREATININE 1.87* 2.54* 3.07*  CALCIUM 8.6* 8.9 8.8*    Liver Function Tests: Recent Labs  Lab 03/02/18 1750 03/04/18 0124 03/06/18 0541  AST 76* 113* 104*  ALT 69* 101* 122*  ALKPHOS 269* 384* 351*  BILITOT 0.6 2.5* 1.7*  PROT 7.1 7.4 6.4*  ALBUMIN 2.9* 3.0* 2.8*   No results for input(s): LIPASE, AMYLASE in the last 168 hours. No results for input(s): AMMONIA in the last 168 hours.  CBC: Recent Labs  Lab 03/02/18 1750 03/03/18 0847 03/04/18 0124  WBC 4.6  --  6.4  NEUTROABS  --   --  4.5   HGB 7.8* 7.4* 9.9*  HCT 23.5* 23.2* 29.8*  MCV 89.2  --  91.0  PLT 269  --  276    Cardiac Enzymes: Recent Labs  Lab 03/02/18 1750 03/04/18 0124  TROPONINI 0.04* 0.06*    BNP: Invalid input(s): POCBNP  CBG: No results for input(s): GLUCAP in the last 168 hours.  Microbiology: Results for orders placed or performed during the hospital encounter of 02/09/18  MRSA PCR Screening     Status: None   Collection Time: 02/10/18  4:28 AM  Result Value Ref Range Status   MRSA by PCR NEGATIVE NEGATIVE Final    Comment:        The GeneXpert MRSA Assay (FDA approved for NASAL specimens only), is one component of a comprehensive MRSA colonization surveillance program. It is not intended to diagnose MRSA infection nor to guide or monitor treatment for MRSA infections. Performed at [facility], [address] [place], [place] [address]     Coagulation Studies: No results for input(s): LABPROT, INR in the last 72 hours.  Urinalysis: No results for input(s): COLORURINE, LABSPEC, PHURINE, GLUCOSEU, HGBUR, BILIRUBINUR, KETONESUR, PROTEINUR, UROBILINOGEN, NITRITE, LEUKOCYTESUR in the last 72 hours.  Invalid input(s): APPERANCEUR    Imaging: No results found.   Medications:    . heparin  5,000 Units Subcutaneous Q8H  . hydroxychloroquine  200 mg Oral Daily  . letrozole  2.5 mg Oral Daily  . metoprolol succinate  50 mg Oral Daily  . mycophenolate  500 mg Oral BID  . predniSONE  10 mg Oral Daily  . torsemide  40 mg Oral BID   acetaminophen **OR** acetaminophen, calcium carbonate, ipratropium-albuterol, ondansetron **OR** ondansetron (ZOFRAN) IV, technetium albumin aggregated, technetium TC 8M diethylenetriame-pentaacetic acid  Assessment/ Plan:  Ms. Malyna Budney is a 52 y.o. black female  with lupus nephritis class V biopsy proven, SLE,systolic congestive heart failure, hypertension, anemia, vitamin D deficiency, breast cancer status post left mastectomy,  chemotherapy and radiation who was admitted to [facility] from SOB (shortness of breath) [R06.02]   1. Acute renal failure on chronic kidney disease stage III. CKD secondary to lupus nephritis.  Acute renal failure from acute cardiorenal syndrome.  - Holding losartan - continue torsemide.  - Fluid restriction  2. Lupus Nephritis: class V membranous. with proteinuria, hematuria and ANA positive. History of normal compliments.  - Continue mycophenolate, hydroxychloroquine and prednisone - Hold losartan - Denies use of nonsteroidal anti-inflammatory agents.  3. Hypertension with acute exacerbation of systolic congestive heart failure. EF 20-25%: blood pressure at goal during clinic encounter. Not a candidate for spironolactone due to hyperkalemia.  Did not tolerate metolazone - gave blurry vision - Currently holding losartan and hydrochlorothiazide - Torsemide as above.   - Continue metoprolol  4. Anemia: beta thalassemia. Hemoglobin 9.9. Status post PRBC transfusion on 5/16.    LOS: 0 Melanie Holloway 5/19/201912:47 PM

## 2018-03-06 NOTE — Progress Notes (Signed)
Melanie Holloway to be D/C'd Home per MD order.  Discussed prescriptions and follow up appointments with the patient. Prescriptions electronically submitted, medication list explained in detail. Pt verbalized understanding.  Allergies as of 03/06/2018   No Known Allergies     Medication List    STOP taking these medications   fluconazole 100 MG tablet Commonly known as:  DIFLUCAN   furosemide 40 MG tablet Commonly known as:  LASIX   losartan 50 MG tablet Commonly known as:  COZAAR   metolazone 2.5 MG tablet Commonly known as:  ZAROXOLYN     TAKE these medications   acetaminophen 325 MG tablet Commonly known as:  TYLENOL Take 2 tablets (650 mg total) by mouth every 4 (four) hours as needed for headache or mild pain. Notes to patient:  Took 5/19 at 9am   hydroxychloroquine 200 MG tablet Commonly known as:  PLAQUENIL Take 1 tablet (200 mg total) by mouth daily.   letrozole 2.5 MG tablet Commonly known as:  FEMARA TAKE 1 TABLET BY MOUTH EVERY DAY   Magnesium 400 MG Caps Take 400 mg by mouth daily.   metoprolol succinate 50 MG 24 hr tablet Commonly known as:  TOPROL-XL Take 1 tablet (50 mg total) by mouth daily. Take with or immediately following a meal.   multivitamin with minerals Tabs tablet Take 1 tablet by mouth daily.   mycophenolate 250 MG capsule Commonly known as:  CELLCEPT Take 2 capsules (500 mg total) by mouth 2 (two) times daily.   predniSONE 10 MG tablet Commonly known as:  DELTASONE Take 1 tablet (10 mg total) by mouth daily.   torsemide 20 MG tablet Commonly known as:  DEMADEX Take 2 tablets (40 mg total) by mouth 2 (two) times daily.   triamcinolone cream 0.1 % Commonly known as:  KENALOG Apply 1 application topically 2 (two) times daily. Apply to AA.   Vitamin D 2000 units Caps Take 1 capsule (2,000 Units total) by mouth daily.       Vitals:   03/06/18 0616 03/06/18 0719  BP:  120/86  Pulse:  78  Resp:  18  Temp:  97.7 F (36.5 C)   SpO2: 100% 100%    Skin clean, dry and intact without evidence of skin break down, no evidence of skin tears noted. IV catheter discontinued intact. Site without signs and symptoms of complications. Dressing and pressure applied. Pt denies pain at this time. No complaints noted.  An After Visit Summary was printed and given to the patient. Patient escorted via [place], and D/C home via private auto.  [place]

## 2018-03-06 NOTE — Care Management (Signed)
Discharge to home today per Dr. Posey Pronto. Requested resumption of services per [place]. [place]erm[place], [facility] representative updated.  Family/Friend will transport. Shelbie Ammons RN MSN CCM Care Management [phone]
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
## 2018-03-09 ENCOUNTER — Telehealth: Payer: Self-pay

## 2018-03-09 DIAGNOSIS — N189 Chronic kidney disease, unspecified: Secondary | ICD-10-CM | POA: Diagnosis not present

## 2018-03-09 DIAGNOSIS — I5023 Acute on chronic systolic (congestive) heart failure: Secondary | ICD-10-CM | POA: Diagnosis not present

## 2018-03-09 DIAGNOSIS — I130 Hypertensive heart and chronic kidney disease with heart failure and stage 1 through stage 4 chronic kidney disease, or unspecified chronic kidney disease: Secondary | ICD-10-CM | POA: Diagnosis not present

## 2018-03-09 NOTE — Telephone Encounter (Signed)
Agree with this. Thanks.  

## 2018-03-09 NOTE — Telephone Encounter (Signed)
Copied from [place] [id number]. Topic: Inquiry >> Mar 08, 2018  6:41 PM Tye [place] wrote: Reason for CRM: [facility] called to get an extension for PT needing a presumption of care orders, contact [phone]

## 2018-03-09 NOTE — Telephone Encounter (Signed)
Left message on vm for Merry Proud with [facility] notifying him Dr. Danise Mina agrees with extension for PT.
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
## 2018-03-10 ENCOUNTER — Encounter: Payer: Self-pay | Admitting: Family

## 2018-03-10 ENCOUNTER — Ambulatory Visit: Payer: 59 | Attending: Family | Admitting: Family

## 2018-03-10 VITALS — BP 128/82 | HR 93 | Resp 18 | Ht 59.0 in | Wt 136.5 lb

## 2018-03-10 DIAGNOSIS — D649 Anemia, unspecified: Secondary | ICD-10-CM | POA: Insufficient documentation

## 2018-03-10 DIAGNOSIS — Z9012 Acquired absence of left breast and nipple: Secondary | ICD-10-CM | POA: Insufficient documentation

## 2018-03-10 DIAGNOSIS — R05 Cough: Secondary | ICD-10-CM | POA: Diagnosis not present

## 2018-03-10 DIAGNOSIS — C50919 Malignant neoplasm of unspecified site of unspecified female breast: Secondary | ICD-10-CM | POA: Diagnosis not present

## 2018-03-10 DIAGNOSIS — I5032 Chronic diastolic (congestive) heart failure: Secondary | ICD-10-CM

## 2018-03-10 DIAGNOSIS — I130 Hypertensive heart and chronic kidney disease with heart failure and stage 1 through stage 4 chronic kidney disease, or unspecified chronic kidney disease: Secondary | ICD-10-CM | POA: Diagnosis not present

## 2018-03-10 DIAGNOSIS — I129 Hypertensive chronic kidney disease with stage 1 through stage 4 chronic kidney disease, or unspecified chronic kidney disease: Secondary | ICD-10-CM | POA: Diagnosis not present

## 2018-03-10 DIAGNOSIS — R42 Dizziness and giddiness: Secondary | ICD-10-CM | POA: Diagnosis not present

## 2018-03-10 DIAGNOSIS — Z79899 Other long term (current) drug therapy: Secondary | ICD-10-CM | POA: Insufficient documentation

## 2018-03-10 DIAGNOSIS — Z803 Family history of malignant neoplasm of breast: Secondary | ICD-10-CM | POA: Diagnosis not present

## 2018-03-10 DIAGNOSIS — E875 Hyperkalemia: Secondary | ICD-10-CM | POA: Diagnosis not present

## 2018-03-10 DIAGNOSIS — R809 Proteinuria, unspecified: Secondary | ICD-10-CM | POA: Diagnosis not present

## 2018-03-10 DIAGNOSIS — Z833 Family history of diabetes mellitus: Secondary | ICD-10-CM | POA: Diagnosis not present

## 2018-03-10 DIAGNOSIS — Z7952 Long term (current) use of systemic steroids: Secondary | ICD-10-CM | POA: Diagnosis not present

## 2018-03-10 DIAGNOSIS — M3214 Glomerular disease in systemic lupus erythematosus: Secondary | ICD-10-CM | POA: Insufficient documentation

## 2018-03-10 DIAGNOSIS — Z9851 Tubal ligation status: Secondary | ICD-10-CM | POA: Diagnosis not present

## 2018-03-10 DIAGNOSIS — I890 Lymphedema, not elsewhere classified: Secondary | ICD-10-CM | POA: Diagnosis not present

## 2018-03-10 DIAGNOSIS — E559 Vitamin D deficiency, unspecified: Secondary | ICD-10-CM | POA: Insufficient documentation

## 2018-03-10 DIAGNOSIS — N189 Chronic kidney disease, unspecified: Secondary | ICD-10-CM | POA: Insufficient documentation

## 2018-03-10 DIAGNOSIS — I5022 Chronic systolic (congestive) heart failure: Secondary | ICD-10-CM | POA: Insufficient documentation

## 2018-03-10 DIAGNOSIS — Z170 Estrogen receptor positive status [ER+]: Secondary | ICD-10-CM | POA: Insufficient documentation

## 2018-03-10 DIAGNOSIS — N179 Acute kidney failure, unspecified: Secondary | ICD-10-CM | POA: Diagnosis not present

## 2018-03-10 DIAGNOSIS — Z9889 Other specified postprocedural states: Secondary | ICD-10-CM | POA: Insufficient documentation

## 2018-03-10 DIAGNOSIS — Z853 Personal history of malignant neoplasm of breast: Secondary | ICD-10-CM | POA: Diagnosis not present

## 2018-03-10 DIAGNOSIS — I10 Essential (primary) hypertension: Secondary | ICD-10-CM

## 2018-03-10 DIAGNOSIS — E669 Obesity, unspecified: Secondary | ICD-10-CM | POA: Diagnosis not present

## 2018-03-10 DIAGNOSIS — N183 Chronic kidney disease, stage 3 (moderate): Secondary | ICD-10-CM | POA: Diagnosis not present

## 2018-03-10 DIAGNOSIS — C50512 Malignant neoplasm of lower-outer quadrant of left female breast: Secondary | ICD-10-CM

## 2018-03-10 NOTE — Patient Instructions (Signed)
Continue weighing daily and call for an overnight weight gain of > 2 pounds or a weekly weight gain of >5 pounds. 

## 2018-03-11 DIAGNOSIS — I130 Hypertensive heart and chronic kidney disease with heart failure and stage 1 through stage 4 chronic kidney disease, or unspecified chronic kidney disease: Secondary | ICD-10-CM | POA: Diagnosis not present

## 2018-03-11 DIAGNOSIS — N189 Chronic kidney disease, unspecified: Secondary | ICD-10-CM | POA: Diagnosis not present

## 2018-03-11 DIAGNOSIS — I5023 Acute on chronic systolic (congestive) heart failure: Secondary | ICD-10-CM | POA: Diagnosis not present

## 2018-03-12 DIAGNOSIS — I5023 Acute on chronic systolic (congestive) heart failure: Secondary | ICD-10-CM | POA: Diagnosis not present

## 2018-03-12 DIAGNOSIS — Z170 Estrogen receptor positive status [ER+]: Secondary | ICD-10-CM | POA: Diagnosis not present

## 2018-03-12 DIAGNOSIS — D61818 Other pancytopenia: Secondary | ICD-10-CM | POA: Diagnosis not present

## 2018-03-12 DIAGNOSIS — M3219 Other organ or system involvement in systemic lupus erythematosus: Secondary | ICD-10-CM | POA: Diagnosis not present

## 2018-03-12 DIAGNOSIS — C771 Secondary and unspecified malignant neoplasm of intrathoracic lymph nodes: Secondary | ICD-10-CM | POA: Diagnosis not present

## 2018-03-12 DIAGNOSIS — Z853 Personal history of malignant neoplasm of breast: Secondary | ICD-10-CM | POA: Diagnosis not present

## 2018-03-12 DIAGNOSIS — N189 Chronic kidney disease, unspecified: Secondary | ICD-10-CM | POA: Diagnosis not present

## 2018-03-12 DIAGNOSIS — C7951 Secondary malignant neoplasm of bone: Secondary | ICD-10-CM | POA: Diagnosis not present

## 2018-03-12 DIAGNOSIS — Z7952 Long term (current) use of systemic steroids: Secondary | ICD-10-CM | POA: Diagnosis not present

## 2018-03-12 DIAGNOSIS — J9621 Acute and chronic respiratory failure with hypoxia: Secondary | ICD-10-CM | POA: Diagnosis not present

## 2018-03-12 DIAGNOSIS — D649 Anemia, unspecified: Secondary | ICD-10-CM | POA: Diagnosis not present

## 2018-03-15 ENCOUNTER — Inpatient Hospital Stay: Payer: 59 | Admitting: Family Medicine

## 2018-03-15 DIAGNOSIS — Z0289 Encounter for other administrative examinations: Secondary | ICD-10-CM

## 2018-03-16 DIAGNOSIS — I130 Hypertensive heart and chronic kidney disease with heart failure and stage 1 through stage 4 chronic kidney disease, or unspecified chronic kidney disease: Secondary | ICD-10-CM | POA: Diagnosis not present

## 2018-03-16 DIAGNOSIS — I5023 Acute on chronic systolic (congestive) heart failure: Secondary | ICD-10-CM | POA: Diagnosis not present

## 2018-03-16 DIAGNOSIS — N189 Chronic kidney disease, unspecified: Secondary | ICD-10-CM | POA: Diagnosis not present

## 2018-03-23 ENCOUNTER — Other Ambulatory Visit: Payer: 59

## 2018-03-23 ENCOUNTER — Ambulatory Visit: Payer: 59 | Admitting: Oncology

## 2018-03-24 ENCOUNTER — Telehealth: Payer: Self-pay

## 2018-03-24 DIAGNOSIS — I5023 Acute on chronic systolic (congestive) heart failure: Secondary | ICD-10-CM | POA: Diagnosis not present

## 2018-03-24 DIAGNOSIS — N189 Chronic kidney disease, unspecified: Secondary | ICD-10-CM | POA: Diagnosis not present

## 2018-03-24 DIAGNOSIS — I130 Hypertensive heart and chronic kidney disease with heart failure and stage 1 through stage 4 chronic kidney disease, or unspecified chronic kidney disease: Secondary | ICD-10-CM | POA: Diagnosis not present

## 2018-03-24 NOTE — Telephone Encounter (Signed)
Melanie Holloway contacted the office to report a weight gain. She states that she went from 137 lbs yesterday to 139.8 lbs today. She denies any increased shortness of breath but does have some edema in her lower extremities.   Per Darylene Price FNP patient is to take an additional 40 mg of Torsemide daily for the next 2 days. She will then need to contact the office Monday to update [place] on her weight.   Patient verbalized understanding and will call back on Monday.

## 2018-03-28 DIAGNOSIS — N183 Chronic kidney disease, stage 3 (moderate): Secondary | ICD-10-CM | POA: Diagnosis not present

## 2018-03-28 DIAGNOSIS — M3214 Glomerular disease in systemic lupus erythematosus: Secondary | ICD-10-CM | POA: Diagnosis not present

## 2018-03-28 DIAGNOSIS — R809 Proteinuria, unspecified: Secondary | ICD-10-CM | POA: Diagnosis not present

## 2018-03-29 ENCOUNTER — Telehealth: Payer: Self-pay | Admitting: Family Medicine

## 2018-03-29 ENCOUNTER — Ambulatory Visit (INDEPENDENT_AMBULATORY_CARE_PROVIDER_SITE_OTHER): Payer: 59 | Admitting: Family Medicine

## 2018-03-29 ENCOUNTER — Telehealth: Payer: Self-pay | Admitting: Family

## 2018-03-29 ENCOUNTER — Ambulatory Visit: Payer: 59 | Admitting: Family

## 2018-03-29 ENCOUNTER — Encounter: Payer: Self-pay | Admitting: Family Medicine

## 2018-03-29 VITALS — BP 130/80 | HR 94 | Temp 98.3°F | Ht 59.0 in | Wt 141.5 lb

## 2018-03-29 DIAGNOSIS — D649 Anemia, unspecified: Secondary | ICD-10-CM

## 2018-03-29 DIAGNOSIS — M3214 Glomerular disease in systemic lupus erythematosus: Secondary | ICD-10-CM

## 2018-03-29 DIAGNOSIS — I5022 Chronic systolic (congestive) heart failure: Secondary | ICD-10-CM

## 2018-03-29 DIAGNOSIS — I10 Essential (primary) hypertension: Secondary | ICD-10-CM

## 2018-03-29 DIAGNOSIS — M328 Other forms of systemic lupus erythematosus: Secondary | ICD-10-CM | POA: Diagnosis not present

## 2018-03-29 DIAGNOSIS — I5032 Chronic diastolic (congestive) heart failure: Secondary | ICD-10-CM | POA: Diagnosis not present

## 2018-03-29 DIAGNOSIS — I38 Endocarditis, valve unspecified: Secondary | ICD-10-CM | POA: Diagnosis not present

## 2018-03-29 LAB — CBC WITH DIFFERENTIAL/PLATELET
BASOS ABS: 0 10*3/uL (ref 0.0–0.1)
BASOS PCT: 0.2 % (ref 0.0–3.0)
Eosinophils Absolute: 0 10*3/uL (ref 0.0–0.7)
Eosinophils Relative: 0.1 % (ref 0.0–5.0)
HEMATOCRIT: 28.2 % — AB (ref 36.0–46.0)
Hemoglobin: 9.4 g/dL — ABNORMAL LOW (ref 12.0–15.0)
LYMPHS ABS: 1.2 10*3/uL (ref 0.7–4.0)
LYMPHS PCT: 24.1 % (ref 12.0–46.0)
MCHC: 33.2 g/dL (ref 30.0–36.0)
MCV: 88 fl (ref 78.0–100.0)
Monocytes Absolute: 0.3 10*3/uL (ref 0.1–1.0)
Monocytes Relative: 7 % (ref 3.0–12.0)
NEUTROS ABS: 3.4 10*3/uL (ref 1.4–7.7)
Neutrophils Relative %: 68.6 % (ref 43.0–77.0)
PLATELETS: 221 10*3/uL (ref 150.0–400.0)
RBC: 3.21 Mil/uL — ABNORMAL LOW (ref 3.87–5.11)
RDW: 17.8 % — AB (ref 11.5–15.5)
WBC: 4.9 10*3/uL (ref 4.0–10.5)

## 2018-03-29 LAB — COMPREHENSIVE METABOLIC PANEL
ALBUMIN: 2.9 g/dL — AB (ref 3.5–5.2)
ALT: 29 U/L (ref 0–35)
AST: 35 U/L (ref 0–37)
Alkaline Phosphatase: 116 U/L (ref 39–117)
BILIRUBIN TOTAL: 0.4 mg/dL (ref 0.2–1.2)
BUN: 64 mg/dL — ABNORMAL HIGH (ref 6–23)
CALCIUM: 8.8 mg/dL (ref 8.4–10.5)
CHLORIDE: 104 meq/L (ref 96–112)
CO2: 28 mEq/L (ref 19–32)
CREATININE: 1.62 mg/dL — AB (ref 0.40–1.20)
GFR: 42.68 mL/min — ABNORMAL LOW (ref >60.00)
Glucose, Bld: 94 mg/dL (ref 70–99)
Potassium: 2.7 mEq/L — CL (ref 3.5–5.1)
SODIUM: 142 meq/L (ref 135–145)
TOTAL PROTEIN: 6.5 g/dL (ref 6.0–8.3)

## 2018-03-29 LAB — TSH: TSH: 3.37 u[IU]/mL (ref 0.35–4.50)

## 2018-03-29 MED ORDER — PREDNISONE 10 MG PO TABS: 10.0000 mg | ORAL_TABLET | Freq: Every day | ORAL | 0 refills | Status: DC

## 2018-03-29 MED ORDER — POTASSIUM CHLORIDE CRYS ER 20 MEQ PO TBCR: 20.0000 meq | EXTENDED_RELEASE_TABLET | Freq: Every day | ORAL | 6 refills | Status: DC

## 2018-03-29 NOTE — Assessment & Plan Note (Addendum)
5 lb weight gain since last visit by our scales. Stable at home. Continue torsemide 40mg  bid. Not currently taking potassium - check K and Cr today. Appreciate CHF care.  Latest echo 02/09/2018 with EF 25-30%, mod AR and MR. Will check with CHF clinic to see when next echocardiogram recommended.

## 2018-03-29 NOTE — Telephone Encounter (Signed)
Plz call - Potassium level was very low - recommend she start potassium pill 50mEq twice daily for 5 days then 85mEq once daily.  Sent to pharmacy. Would like her to start today if possible.

## 2018-03-29 NOTE — Assessment & Plan Note (Signed)
Appreciate rheum care (Dr Jefm Bryant). Pt continues hydrochloroquine and cellcept as well as prednisone 10mg  daily - has f/u later this month. ?plan for prednisone. Will refill prednisone 10mg  #30 until she sees rheum.

## 2018-03-29 NOTE — Assessment & Plan Note (Addendum)
Recent transfusion 03/03/2018. Update CBC today.

## 2018-03-29 NOTE — Telephone Encounter (Signed)
Patient called to say that her weight had declined from 139.8 pounds to 136 pounds after increasing her diuretic and she feels better.   She was asking about getting a referral to pulmonology rehab. Advised her that I would put the referral in and they would send it to her cardiologist for signature.

## 2018-03-29 NOTE — Telephone Encounter (Signed)
[facility] called with critical results... Potassium 2.7 Results given to [place], RTR

## 2018-03-29 NOTE — Assessment & Plan Note (Signed)
Chronic, seems stable on current regimen of toprol XL 50mg  daily and torsemide 40mg  bid. Not currently taking clonidine patch, or losartan. Update labs today.

## 2018-03-29 NOTE — Telephone Encounter (Signed)
Spoke with pt relaying message and instructions per Dr. G.  Pt verbalizes understanding. 

## 2018-03-29 NOTE — Progress Notes (Signed)
BP 130/80 (BP Location: Right Arm, Patient Position: Sitting, Cuff Size: Normal)   Pulse 94   Temp 98.3 F (36.8 C) (Oral)   Ht 4\' 11"  (1.499 m)   Wt 141 lb 8 oz (64.2 kg)   LMP 03/04/2009 (Approximate)   SpO2 99%   BMI 28.58 kg/m    CC: hosp f/u visit Subjective:    Patient ID: Rumi Kolodziej, female    DOB: [birth year], 52 y.o.   MRN: [id number]  HPI: Zoria Rawlinson is a 52 y.o. female presenting on 03/29/2018 for Hospitalization Follow-up (Admitted to [facility] on 03/03/18, primary dx SOB. Dr. Juleen China wants copy of any labs done today. Pt accompanied by daughter. )   Hospitalized again last month 5/16-19/2019 for dyspnea, thought related to acute on chronic diastolic CHF exacerbation. Latest received blood transfusion 03/03/2018 - post transfusion Hgb 9.8. Latest EF 20%.   Seen yesterday by nephrology and note reviewed - continues fluid restricted diet, current BP regimen is toprol XL 50mg  daily, torsemide 40mg  BID scheduled. Losartan on hold, avoiding spironolactone due to hyperkalemia history. Did not tolerate metolazone. Pt not taking clonidine patch.   Saw cardiology last week, torsemide adjusted.  Ongoing dyspnea with exertion past 40 ft.   Also on cellcept and plaquenil and prednisone 10mg  for SLE. F/u with rheum at end of June.  Planning pulm rehab for chronic dyspnea.   HH completed therapy. HHPT recommended pulm rehab.   Relevant past medical, surgical, family and social history reviewed and updated as indicated. Interim medical history since our last visit reviewed. Allergies and medications reviewed and updated. Outpatient Medications Prior to Visit  Medication Sig Dispense Refill  . acetaminophen (TYLENOL) 325 MG tablet Take 2 tablets (650 mg total) by mouth every 4 (four) hours as needed for headache or mild pain. 30 tablet 0  . Cholecalciferol (VITAMIN D) 2000 UNITS CAPS Take 1 capsule (2,000 Units total) by mouth daily. 30 capsule   . hydroxychloroquine (PLAQUENIL) 200 MG  tablet Take 1 tablet (200 mg total) by mouth daily. 60 tablet 2  . letrozole (FEMARA) 2.5 MG tablet TAKE 1 TABLET BY MOUTH EVERY DAY 90 tablet 1  . Magnesium 400 MG CAPS Take 400 mg by mouth daily. 30 capsule 0  . metoprolol succinate (TOPROL-XL) 50 MG 24 hr tablet Take 1 tablet (50 mg total) by mouth daily. Take with or immediately following a meal. 30 tablet 0  . Multiple Vitamin (MULTIVITAMIN WITH MINERALS) TABS tablet Take 1 tablet by mouth daily. 30 tablet 0  . mycophenolate (CELLCEPT) 250 MG capsule Take 2 capsules (500 mg total) by mouth 2 (two) times daily. 60 capsule 0  . torsemide (DEMADEX) 20 MG tablet Take 2 tablets (40 mg total) by mouth 2 (two) times daily. 60 tablet 0  . triamcinolone cream (KENALOG) 0.1 % Apply 1 application topically 2 (two) times daily. Apply to AA. 453.6 g 0  . predniSONE (DELTASONE) 10 MG tablet Take 1 tablet (10 mg total) by mouth daily. 30 tablet 0   No facility-administered medications prior to visit.      Per HPI unless specifically indicated in ROS section below Review of Systems     Objective:    BP 130/80 (BP Location: Right Arm, Patient Position: Sitting, Cuff Size: Normal)   Pulse 94   Temp 98.3 F (36.8 C) (Oral)   Ht 4\' 11"  (1.499 m)   Wt 141 lb 8 oz (64.2 kg)   LMP 03/04/2009 (Approximate)   SpO2 99%  BMI 28.58 kg/m   Wt Readings from Last 3 Encounters:  03/29/18 141 lb 8 oz (64.2 kg)  03/10/18 136 lb 8 oz (61.9 kg)  03/06/18 155 lb 9.6 oz (70.6 kg)    Physical Exam  Constitutional: She appears well-developed and well-nourished. No distress.  HENT:  Mouth/Throat: Oropharynx is clear and moist. No oropharyngeal exudate.  Eyes: Pupils are equal, round, and reactive to light. EOM are normal.  Cardiovascular: Normal rate, regular rhythm and normal heart sounds.  No murmur heard. Pulmonary/Chest: Effort normal and breath sounds normal. No respiratory distress. She has no wheezes. She has no rales.  Musculoskeletal: She exhibits  edema (nonpitting).  Skin: No rash noted.  Nursing note and vitals reviewed.  Results for orders placed or performed during the hospital encounter of 03/03/18  Blood gas, venous  Result Value Ref Range   pH, Ven 7.42 7.250 - 7.430   pCO2, Ven 26 (L) 44.0 - 60.0 mmHg   pO2, Ven 36.0 32.0 - 45.0 mmHg   Bicarbonate 16.9 (L) 20.0 - 28.0 mmol/L   Acid-base deficit 6.4 (H) 0.0 - 2.0 mmol/L   O2 Saturation 70.4 %   Patient temperature 37.0    Collection site VENOUS    Sample type VENOUS   Brain natriuretic peptide  Result Value Ref Range   B Natriuretic Peptide >4,500.0 (H) 0.0 - 100.0 pg/mL  CBC with Differential/Platelet  Result Value Ref Range   WBC 6.4 3.6 - 11.0 K/uL   RBC 3.27 (L) 3.80 - 5.20 MIL/uL   Hemoglobin 9.9 (L) 12.0 - 16.0 g/dL   HCT 29.8 (L) 35.0 - 47.0 %   MCV 91.0 80.0 - 100.0 fL   MCH 30.4 26.0 - 34.0 pg   MCHC 33.4 32.0 - 36.0 g/dL   RDW 17.6 (H) 11.5 - 14.5 %   Platelets 276 150 - 440 K/uL   Neutrophils Relative % 70 %   Lymphocytes Relative 24 %   Monocytes Relative 6 %   Eosinophils Relative 0 %   Basophils Relative 0 %   Band Neutrophils 0 %   Metamyelocytes Relative 0 %   Myelocytes 0 %   Promyelocytes Relative 0 %   Blasts 0 %   nRBC 7 (H) 0 /100 WBC   Other 0 %   RBC Morphology MIXED RBC POPULATION    Neutro Abs 4.5 1.4 - 6.5 K/uL   Lymphs Abs 1.5 1.0 - 3.6 K/uL   Monocytes Absolute 0.4 0.2 - 0.9 K/uL   Eosinophils Absolute 0.0 0 - 0.7 K/uL   Basophils Absolute 0.0 0 - 0.1 K/uL  Comprehensive metabolic panel  Result Value Ref Range   Sodium 137 135 - 145 mmol/L   Potassium 4.0 3.5 - 5.1 mmol/L   Chloride 105 101 - 111 mmol/L   CO2 17 (L) 22 - 32 mmol/L   Glucose, Bld 123 (H) 65 - 99 mg/dL   BUN 67 (H) 6 - 20 mg/dL   Creatinine, Ser 2.54 (H) 0.44 - 1.00 mg/dL   Calcium 8.9 8.9 - 10.3 mg/dL   Total Protein 7.4 6.5 - 8.1 g/dL   Albumin 3.0 (L) 3.5 - 5.0 g/dL   AST 113 (H) 15 - 41 U/L   ALT 101 (H) 14 - 54 U/L   Alkaline Phosphatase 384  (H) 38 - 126 U/L   Total Bilirubin 2.5 (H) 0.3 - 1.2 mg/dL   GFR calc non Af Amer 20 (L) >60 mL/min   GFR calc Af Amer 24 (  L) >60 mL/min   Anion gap 15 5 - 15  Fibrin derivatives D-Dimer ([facility] only)  Result Value Ref Range   Fibrin derivatives D-dimer ([facility]) 847.48 (H) 0.00 - 499.00 ng/mL (FEU)  Troponin I  Result Value Ref Range   Troponin I 0.06 (HH) <0.03 ng/mL  Comprehensive metabolic panel  Result Value Ref Range   Sodium 137 135 - 145 mmol/L   Potassium 3.6 3.5 - 5.1 mmol/L   Chloride 106 101 - 111 mmol/L   CO2 16 (L) 22 - 32 mmol/L   Glucose, Bld 109 (H) 65 - 99 mg/dL   BUN 95 (H) 6 - 20 mg/dL   Creatinine, Ser 3.07 (H) 0.44 - 1.00 mg/dL   Calcium 8.8 (L) 8.9 - 10.3 mg/dL   Total Protein 6.4 (L) 6.5 - 8.1 g/dL   Albumin 2.8 (L) 3.5 - 5.0 g/dL   AST 104 (H) 15 - 41 U/L   ALT 122 (H) 14 - 54 U/L   Alkaline Phosphatase 351 (H) 38 - 126 U/L   Total Bilirubin 1.7 (H) 0.3 - 1.2 mg/dL   GFR calc non Af Amer 16 (L) >60 mL/min   GFR calc Af Amer 19 (L) >60 mL/min   Anion gap 15 5 - 15      Assessment & Plan:   Problem List Items Addressed This Visit    Systemic lupus erythematosus ([place])    Appreciate rheum care (Dr Jefm Bryant). Pt continues hydrochloroquine and cellcept as well as prednisone 10mg  daily - has f/u later this month. ?plan for prednisone. Will refill prednisone 10mg  #30 until she sees rheum.       Relevant Orders   Comprehensive metabolic panel   Lupus nephritis, ISN/RPS class V [facility])    Appreciate nephrology care. Will forward today's note and labs attn Dr Juleen China. Pt not currently taking clonidine patch - states she's been off this med since ~11/2017.       Relevant Orders   Comprehensive metabolic panel   TSH   CBC with Differential/Platelet   Heart valve regurgitation   Essential hypertension    Chronic, seems stable on current regimen of toprol XL 50mg  daily and torsemide 40mg  bid. Not currently taking clonidine patch, or losartan. Update labs today.         Chronic diastolic heart failure ([facility]) - Primary (Chronic)    5 lb weight gain since last visit by our scales. Stable at home. Continue torsemide 40mg  bid. Not currently taking potassium - check K and Cr today. Appreciate CHF care.  Latest echo 02/09/2018 with EF 25-30%, mod AR and MR. Will check with CHF clinic to see when next echocardiogram recommended.      Relevant Orders   TSH   CBC with Differential/Platelet   Anemia    Recent transfusion 03/03/2018. Update CBC today.          Meds ordered this encounter  Medications  . predniSONE (DELTASONE) 10 MG tablet    Sig: Take 1 tablet (10 mg total) by mouth daily.    Dispense:  30 tablet    Refill:  0   Orders Placed This Encounter  Procedures  . Comprehensive metabolic panel  . TSH  . CBC with Differential/Platelet    Follow up plan: No follow-ups on file.  Ria Bush, MD

## 2018-03-29 NOTE — Patient Instructions (Addendum)
Make sure you do have appt scheduled follow up with rheum. Question for rheumatology - currently on prednisone 10mg  - should we continue daily prednisone or slow taper off? Could plaquenil contribute to trouble with CHF? Labs today - we will send results to Dr Juleen China.  Continue current medicines.  Check with heart doctors Otila Kluver or Dr Saralyn Pilar) about when next ultrasound will be due.

## 2018-03-29 NOTE — Assessment & Plan Note (Addendum)
Appreciate nephrology care. Will forward today's note and labs attn Dr Juleen China. Pt not currently taking clonidine patch - states she's been off this med since ~11/2017.

## 2018-03-30 ENCOUNTER — Telehealth: Payer: Self-pay | Admitting: Family Medicine

## 2018-03-30 NOTE — Telephone Encounter (Signed)
Spoke with pt relaying Dr. G's message. Pt verbalizes understanding.  

## 2018-03-30 NOTE — Telephone Encounter (Signed)
plz touch base with patient - I checked with cards, we should wait 3 months prior to rechecking heart ultrasound - so will aim for late July to recheck. Call us to reschedule if hasn't heard from [place] or cards by August.

## 2018-04-01 ENCOUNTER — Telehealth: Payer: Self-pay | Admitting: Family Medicine

## 2018-04-01 NOTE — Telephone Encounter (Signed)
Copied from [place] [phone]. Topic: Inquiry >> Apr 01, 2018 12:13 PM Margot Ables wrote: Reason for CRM: pt is needing a note stating that she is unable to work currently. She is trying to get government assistance and a doctors note is required since she has not been working. She is requesting it be faxed to [phone].

## 2018-04-01 NOTE — Telephone Encounter (Signed)
Copied from [place] [id number]. Topic: Inquiry >> Apr 01, 2018 12:13 PM Margot Ables wrote: Reason for CRM: pt is needing a note stating that she is unable to work currently. She is trying to get government assistance and a doctors note is required since she has not been working. She is requesting it be faxed to [phone]. >> Apr 01, 2018  2:58 PM Boyd Kerbs wrote: Pt called back.  Does not want note faxed.  Call her and she will come pick it up [phone]

## 2018-04-02 ENCOUNTER — Other Ambulatory Visit: Payer: Self-pay | Admitting: Family Medicine

## 2018-04-02 DIAGNOSIS — E876 Hypokalemia: Secondary | ICD-10-CM

## 2018-04-02 NOTE — Telephone Encounter (Signed)
Letter written and in chart. Does pt need a return to work date at this time? Otherwise I have left it open ended for now.

## 2018-04-04 NOTE — Telephone Encounter (Signed)
Spoke with pt relaying Dr. Synthia Innocent message.  States she does not need a date on the letter.  Pt states she will need to pick up the letter since her fax is not working.  Printed letter and placed at front office.

## 2018-04-05 ENCOUNTER — Other Ambulatory Visit (INDEPENDENT_AMBULATORY_CARE_PROVIDER_SITE_OTHER): Payer: 59

## 2018-04-05 DIAGNOSIS — E876 Hypokalemia: Secondary | ICD-10-CM | POA: Diagnosis not present

## 2018-04-05 LAB — POTASSIUM: POTASSIUM: 3.3 meq/L — AB (ref 3.5–5.1)

## 2018-04-06 ENCOUNTER — Encounter (HOSPITAL_COMMUNITY)
Admission: RE | Admit: 2018-04-06 | Discharge: 2018-04-06 | Disposition: A | Discharge status: Home / Self Care | Payer: 59 | Source: Ambulatory Visit | Attending: Adult Health | Admitting: Adult Health

## 2018-04-06 ENCOUNTER — Ambulatory Visit (HOSPITAL_COMMUNITY)
Admission: RE | Admit: 2018-04-06 | Discharge: 2018-04-06 | Disposition: A | Discharge status: Home / Self Care | Payer: 59 | Source: Ambulatory Visit | Attending: Adult Health | Admitting: Adult Health

## 2018-04-06 DIAGNOSIS — R937 Abnormal findings on diagnostic imaging of other parts of musculoskeletal system: Secondary | ICD-10-CM | POA: Diagnosis not present

## 2018-04-06 DIAGNOSIS — C50512 Malignant neoplasm of lower-outer quadrant of left female breast: Secondary | ICD-10-CM

## 2018-04-06 DIAGNOSIS — Z170 Estrogen receptor positive status [ER+]: Secondary | ICD-10-CM | POA: Insufficient documentation

## 2018-04-06 MED ORDER — TECHNETIUM TC 99M MEDRONATE IV KIT
19.4000 | PACK | Freq: Once | INTRAVENOUS | Status: AC | PRN
  Administered 2018-04-06: 19.4 via INTRAVENOUS

## 2018-04-11 ENCOUNTER — Other Ambulatory Visit: Payer: Self-pay

## 2018-04-11 ENCOUNTER — Other Ambulatory Visit: Payer: Self-pay | Admitting: Family Medicine

## 2018-04-11 DIAGNOSIS — C771 Secondary and unspecified malignant neoplasm of intrathoracic lymph nodes: Secondary | ICD-10-CM
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
## 2018-04-12 ENCOUNTER — Inpatient Hospital Stay: Payer: 59

## 2018-04-12 ENCOUNTER — Telehealth: Payer: Self-pay | Admitting: Oncology

## 2018-04-12 ENCOUNTER — Inpatient Hospital Stay: Payer: 59 | Attending: Oncology | Admitting: Oncology

## 2018-04-12 VITALS — BP 132/86 | HR 99 | Temp 98.5°F | Resp 18 | Ht 59.0 in | Wt 141.1 lb

## 2018-04-12 DIAGNOSIS — Z170 Estrogen receptor positive status [ER+]: Secondary | ICD-10-CM | POA: Diagnosis not present

## 2018-04-12 DIAGNOSIS — C7951 Secondary malignant neoplasm of bone: Secondary | ICD-10-CM | POA: Diagnosis not present

## 2018-04-12 DIAGNOSIS — N189 Chronic kidney disease, unspecified: Secondary | ICD-10-CM | POA: Diagnosis not present

## 2018-04-12 DIAGNOSIS — Z79811 Long term (current) use of aromatase inhibitors: Secondary | ICD-10-CM | POA: Insufficient documentation

## 2018-04-12 DIAGNOSIS — C771 Secondary and unspecified malignant neoplasm of intrathoracic lymph nodes: Secondary | ICD-10-CM

## 2018-04-12 DIAGNOSIS — I5020 Unspecified systolic (congestive) heart failure: Secondary | ICD-10-CM | POA: Insufficient documentation

## 2018-04-12 DIAGNOSIS — Z780 Asymptomatic menopausal state: Secondary | ICD-10-CM

## 2018-04-12 DIAGNOSIS — M320 Drug-induced systemic lupus erythematosus: Secondary | ICD-10-CM | POA: Insufficient documentation

## 2018-04-12 DIAGNOSIS — I10 Essential (primary) hypertension: Secondary | ICD-10-CM | POA: Insufficient documentation

## 2018-04-12 DIAGNOSIS — D631 Anemia in chronic kidney disease: Secondary | ICD-10-CM | POA: Insufficient documentation

## 2018-04-12 DIAGNOSIS — C50512 Malignant neoplasm of lower-outer quadrant of left female breast: Secondary | ICD-10-CM | POA: Insufficient documentation

## 2018-04-12 DIAGNOSIS — E2839 Other primary ovarian failure: Secondary | ICD-10-CM

## 2018-04-12 LAB — CBC WITH DIFFERENTIAL (CANCER CENTER ONLY)
Basophils Absolute: 0 10*3/uL (ref 0.0–0.1)
Basophils Relative: 1 %
EOS PCT: 1 %
Eosinophils Absolute: 0 10*3/uL (ref 0.0–0.5)
HEMATOCRIT: 29.5 % — AB (ref 34.8–46.6)
Hemoglobin: 9.7 g/dL — ABNORMAL LOW (ref 11.6–15.9)
Lymphocytes Relative: 17 %
Lymphs Abs: 0.7 10*3/uL — ABNORMAL LOW (ref 0.9–3.3)
MCH: 28.8 pg (ref 25.1–34.0)
MCHC: 33 g/dL (ref 31.5–36.0)
MCV: 87.1 fL (ref 79.5–101.0)
MONO ABS: 0.3 10*3/uL (ref 0.1–0.9)
Monocytes Relative: 7 %
NEUTROS ABS: 3.4 10*3/uL (ref 1.5–6.5)
Neutrophils Relative %: 74 %
PLATELETS: 214 10*3/uL (ref 145–400)
RBC: 3.38 MIL/uL — AB (ref 3.70–5.45)
RDW: 17.6 % — AB (ref 11.2–14.5)
WBC: 4.5 10*3/uL (ref 3.9–10.3)

## 2018-04-12 LAB — CMP (CANCER CENTER ONLY)
ALBUMIN: 2.8 g/dL — AB (ref 3.5–5.0)
ALK PHOS: 131 U/L — AB (ref 38–126)
ALT: 15 U/L (ref 0–44)
ANION GAP: 10 (ref 5–15)
AST: 24 U/L (ref 15–41)
BILIRUBIN TOTAL: 0.3 mg/dL (ref 0.3–1.2)
BUN: 57 mg/dL — ABNORMAL HIGH (ref 6–20)
CALCIUM: 8.7 mg/dL — AB (ref 8.9–10.3)
CO2: 22 mmol/L (ref 22–32)
Chloride: 104 mmol/L (ref 98–111)
Creatinine: 1.79 mg/dL — ABNORMAL HIGH (ref 0.44–1.00)
GFR, EST NON AFRICAN AMERICAN: 31 mL/min — AB (ref >60)
GFR, Est AFR Am: 36 mL/min — ABNORMAL LOW (ref >60)
GLUCOSE: 95 mg/dL (ref 70–99)
Potassium: 3.4 mmol/L — ABNORMAL LOW (ref 3.5–5.1)
Sodium: 136 mmol/L (ref 135–145)
Total Protein: 6.7 g/dL (ref 6.5–8.1)

## 2018-04-12 MED ORDER — LETROZOLE 2.5 MG PO TABS: 2.5000 mg | ORAL_TABLET | Freq: Every day | ORAL | 4 refills | Status: DC

## 2018-04-12 NOTE — Telephone Encounter (Signed)
Gave avs and calendar ° °

## 2018-05-19 ENCOUNTER — Encounter: Payer: 59 | Attending: Cardiology | Admitting: *Deleted

## 2018-05-19 ENCOUNTER — Encounter: Payer: Self-pay | Admitting: *Deleted

## 2018-05-19 VITALS — Ht <= 58 in | Wt 141.1 lb

## 2018-05-19 DIAGNOSIS — Z79899 Other long term (current) drug therapy: Secondary | ICD-10-CM | POA: Insufficient documentation

## 2018-05-19 DIAGNOSIS — I110 Hypertensive heart disease with heart failure: Secondary | ICD-10-CM | POA: Diagnosis not present

## 2018-05-19 DIAGNOSIS — M3214 Glomerular disease in systemic lupus erythematosus: Secondary | ICD-10-CM | POA: Diagnosis not present

## 2018-05-19 DIAGNOSIS — Z853 Personal history of malignant neoplasm of breast: Secondary | ICD-10-CM | POA: Diagnosis not present

## 2018-05-19 DIAGNOSIS — I5022 Chronic systolic (congestive) heart failure: Secondary | ICD-10-CM | POA: Insufficient documentation

## 2018-05-19 NOTE — Progress Notes (Signed)
Daily Session Note  Patient Details  Name: Melanie Holloway MRN: [id number] Date of Birth: [birth year] Referring Provider:     Cardiac Rehab from 05/19/2018 in [facility]  Referring Provider  Paraschos      Encounter Date: 05/19/2018  Check In: Session Check In - 05/19/18 1420      Check-In   Supervising physician immediately available to respond to emergencies  See telemetry face sheet for immediately available ER MD    Location  [facility]iac [facility] Pulmonary [facility]    Staff Present  Renita Papa, RN Vickki Hearing, BA, ACSM CEP, Exercise Physiologist    Warm-up and Cool-down  Not performed (comment) Med Review    Resistance Training Performed  Yes    VAD Patient?  No    PAD/SET Patient?  No      Pain Assessment   Currently in Pain?  No/denies        Exercise Prescription Changes - 05/19/18 1400      Response to Exercise   Blood Pressure (Admit)  102/66    Blood Pressure (Exercise)  114/60    Blood Pressure (Exit)  108/64    Heart Rate (Admit)  76 bpm    Heart Rate (Exercise)  93 bpm    Heart Rate (Exit)  78 bpm    Oxygen Saturation (Admit)  99 %    Oxygen Saturation (Exercise)  100 %    Rating of Perceived Exertion (Exercise)  12       Social History   Tobacco Use  Smoking Status Never Smoker  Smokeless Tobacco Never Used    Goals Met:  Proper associated with RPD/PD & O2 Sat Exercise tolerated well Personal goals reviewed No report of cardiac concerns or symptoms Strength training completed today  Goals Unmet:  Not Applicable  Comments: Med Review completed   Dr. Emily Filbert is Medical Director for [facility] and [facility].

## 2018-05-19 NOTE — Patient Instructions (Signed)
Patient Instructions  Patient Details  Name: Melanie Holloway MRN: [id number] Date of Birth: 01/15/1966 Referring Provider:  Isaias Cowman, MD  Below are your personal goals for exercise, nutrition, and risk factors. Our goal is to help you stay on track towards obtaining and maintaining these goals. We will be discussing your progress on these goals with you throughout the program.  Initial Exercise Prescription: Initial Exercise Prescription - 05/19/18 1400      Date of Initial Exercise RX and Referring Provider   Date  05/19/18    Referring Provider  Paraschos      Treadmill   MPH  1    Grade  0    Minutes  15    METs  1.77      NuStep   Level  1    SPM  80    Minutes  15    METs  2      Biostep-RELP   Level  1    SPM  50    Minutes  15    METs  2      Track   Laps  15    Minutes  15    METs  1.7      Prescription Details   Frequency (times per week)  3    Duration  Progress to 45 minutes of aerobic exercise without signs/symptoms of physical distress      Intensity   THRR 40-80% of Max Heartrate  113-149    Ratings of Perceived Exertion  11-13    Perceived Dyspnea  0-4      Resistance Training   Training Prescription  Yes    Weight  2 lb    Reps  10-15       Exercise Goals: Frequency: Be able to perform aerobic exercise two to three times per week in program working toward 2-5 days per week of home exercise.  Intensity: Work with a perceived exertion of 11 (fairly light) - 15 (hard) while following your exercise prescription.  We will make changes to your prescription with you as you progress through the program.   Duration: Be able to do 30 to 45 minutes of continuous aerobic exercise in addition to a 5 minute warm-up and a 5 minute cool-down routine.   Nutrition Goals: Your personal nutrition goals will be established when you do your nutrition analysis with the dietician.  The following are general nutrition guidelines to follow: Cholesterol  < 200mg /day Sodium < 1500mg /day Fiber: Women over 50 yrs - 21 grams per day  Personal Goals: Personal Goals and Risk Factors at Admission - 05/19/18 1422      Core Components/Risk Factors/Patient Goals on Admission    Weight Management  Yes;Weight Maintenance    Intervention  Weight Management: Provide education and appropriate resources to help participant work on and attain dietary goals.;Weight Management: Develop a combined nutrition and exercise program designed to reach desired caloric intake, while maintaining appropriate intake of nutrient and fiber, sodium and fats, and appropriate energy expenditure required for the weight goal.    Admit Weight  141 lb (64 kg)    Expected Outcomes  Short Term: Continue to assess and modify interventions until short term weight is achieved;Long Term: Adherence to nutrition and physical activity/exercise program aimed toward attainment of established weight goal;Weight Maintenance: Understanding of the daily nutrition guidelines, which includes 25-35% calories from fat, 7% or less cal from saturated fats, less than 200mg  cholesterol, less than 1.5gm of sodium, & 5  or more servings of fruits and vegetables daily;Understanding recommendations for meals to include 15-35% energy as protein, 25-35% energy from fat, 35-60% energy from carbohydrates, less than 200mg  of dietary cholesterol, 20-35 gm of total fiber daily;Understanding of distribution of calorie intake throughout the day with the consumption of 4-5 meals/snacks    Hypertension  Yes    Intervention  Provide education on lifestyle modifcations including regular physical activity/exercise, weight management, moderate sodium restriction and increased consumption of fresh fruit, vegetables, and low fat dairy, alcohol moderation, and smoking cessation.;Monitor prescription use compliance.    Expected Outcomes  Short Term: Continued assessment and intervention until BP is < 140/23mm HG in hypertensive  participants. < 130/60mm HG in hypertensive participants with diabetes, heart failure or chronic kidney disease.;Long Term: Maintenance of blood pressure at goal levels.       Tobacco Use Initial Evaluation: Social History   Tobacco Use  Smoking Status Never Smoker  Smokeless Tobacco Never Used    Exercise Goals and Review: Exercise Goals    Row Name 05/19/18 1438             Exercise Goals   Increase Physical Activity  Yes       Intervention  Provide advice, education, support and counseling about physical activity/exercise needs.;Develop an individualized exercise prescription for aerobic and resistive training based on initial evaluation findings, risk stratification, comorbidities and participant's personal goals.       Expected Outcomes  Short Term: Attend rehab on a regular basis to increase amount of physical activity.;Long Term: Add in home exercise to make exercise part of routine and to increase amount of physical activity.;Long Term: Exercising regularly at least 3-5 days a week.       Increase Strength and Stamina  Yes       Intervention  Provide advice, education, support and counseling about physical activity/exercise needs.;Develop an individualized exercise prescription for aerobic and resistive training based on initial evaluation findings, risk stratification, comorbidities and participant's personal goals.       Expected Outcomes  Short Term: Increase workloads from initial exercise prescription for resistance, speed, and METs.;Short Term: Perform resistance training exercises routinely during rehab and add in resistance training at home;Long Term: Improve cardiorespiratory fitness, muscular endurance and strength as measured by increased METs and functional capacity (6MWT)       Able to understand and use rate of perceived exertion (RPE) scale  Yes       Intervention  Provide education and explanation on how to use RPE scale       Expected Outcomes  Short Term: Able  to use RPE daily in rehab to express subjective intensity level;Long Term:  Able to use RPE to guide intensity level when exercising independently       Able to understand and use Dyspnea scale  Yes       Intervention  Provide education and explanation on how to use Dyspnea scale       Expected Outcomes  Short Term: Able to use Dyspnea scale daily in rehab to express subjective sense of shortness of breath during exertion;Long Term: Able to use Dyspnea scale to guide intensity level when exercising independently       Knowledge and understanding of Target Heart Rate Range (THRR)  Yes       Intervention  Provide education and explanation of THRR including how the numbers were predicted and where they are located for reference       Expected Outcomes  Short Term:  Able to state/look up THRR;Short Term: Able to use daily as guideline for intensity in rehab;Long Term: Able to use THRR to govern intensity when exercising independently       Able to check pulse independently  Yes       Intervention  Provide education and demonstration on how to check pulse in carotid and radial arteries.;Review the importance of being able to check your own pulse for safety during independent exercise       Expected Outcomes  Short Term: Able to explain why pulse checking is important during independent exercise;Long Term: Able to check pulse independently and accurately       Understanding of Exercise Prescription  Yes       Intervention  Provide education, explanation, and written materials on patient's individual exercise prescription       Expected Outcomes  Short Term: Able to explain program exercise prescription;Long Term: Able to explain home exercise prescription to exercise independently          Copy of goals given to participant.
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
## 2018-05-20 ENCOUNTER — Other Ambulatory Visit: Payer: Self-pay | Admitting: Oncology

## 2018-05-20 DIAGNOSIS — Z1231 Encounter for screening mammogram for malignant neoplasm of breast: Secondary | ICD-10-CM

## 2018-05-23 ENCOUNTER — Encounter: Payer: 59 | Admitting: *Deleted

## 2018-05-23 DIAGNOSIS — I110 Hypertensive heart disease with heart failure: Secondary | ICD-10-CM | POA: Diagnosis not present

## 2018-05-23 DIAGNOSIS — I5022 Chronic systolic (congestive) heart failure: Secondary | ICD-10-CM

## 2018-05-23 NOTE — Progress Notes (Signed)
Daily Session Note  Patient Details  Name: Melanie Holloway MRN: [id number] Date of Birth: 04-Mar-1966 Referring Provider:     Cardiac Rehab from 05/19/2018 in [facility] Cardiac and Pulmonary Rehab  Referring Provider  Paras[facility]      Encounter Date: 05/23/2018  Check In: Session Check In - 05/23/18 0802      Check-In   Supervising physician immediately available to respond to emergencies  See telemetry face sheet for immediately available ER MD    Location  [facility]-Cardiac & Pulmonary Rehab    Staff Present  Heath Lark, RN, BSN, CCRP;Jessica [place], MA, RCEP, CCRP, Exercise Physiologist;Marjorie Deprey [place], BS, ACSM CEP, Exercise Physiologist    Medication changes reported      No    Fall or balance concerns reported     No    Tobacco Cessation  No Change    Warm-up and Cool-down  Performed on first and last piece of equipment    Resistance Training Performed  Yes    VAD Patient?  No    PAD/SET Patient?  No      Pain Assessment   Currently in Pain?  No/denies    Multiple Pain Sites  No          Social History   Tobacco Use  Smoking Status Never Smoker  Smokeless Tobacco Never Used    Goals Met:  Exercise tolerated well Personal goals reviewed No report of cardiac concerns or symptoms Strength training completed today  Goals Unmet:  Not Applicable  Comments: First full day of exercise!  Patient was oriented to gym and equipment including functions, settings, policies, and procedures.  Patient's individual exercise prescription and treatment plan were reviewed.  All starting workloads were established based on the results of the 6 minute walk test done at initial orientation visit.  The plan for exercise progression was also introduced and progression will be customized based on patient's performance and goals.    Dr. Emily Filbert is Medical Director for [place] and [facility].

## 2018-05-25 ENCOUNTER — Encounter: Payer: 59 | Admitting: *Deleted

## 2018-05-25 DIAGNOSIS — I5022 Chronic systolic (congestive) heart failure: Secondary | ICD-10-CM

## 2018-05-25 DIAGNOSIS — I10 Essential (primary) hypertension: Secondary | ICD-10-CM | POA: Diagnosis not present

## 2018-05-25 DIAGNOSIS — M3214 Glomerular disease in systemic lupus erythematosus: Secondary | ICD-10-CM | POA: Diagnosis not present

## 2018-05-25 DIAGNOSIS — I110 Hypertensive heart disease with heart failure: Secondary | ICD-10-CM | POA: Diagnosis not present

## 2018-05-25 NOTE — Progress Notes (Signed)
Daily Session Note  Patient Details  Name: Melanie Holloway MRN: [id number] Date of Birth: Aug 26, 1966 Referring Provider:     Cardiac Rehab from 05/19/2018 in [facility] Re[facility]  Referring Provider  Paraschos      Encounter Date: 05/25/2018  Check In: Session Check In - 05/25/18 0817      Check-In   Supervising physician immediately available to respond to emergencies  See telemetry face sheet for immediately available ER MD    Location  [facility]-Cardiac & Pulmonary Rehab    Staff Present  Heath Lark, RN, BSN, CCRP; [place], MA, RCEP, CCRP, Exercise Physiologist;Joseph Tessie Fass RCP,RRT,BSRT    Medication changes reported      No    Fall or balance concerns reported     No    Warm-up and Cool-down  Performed on first and last piece of equipment    Resistance Training Performed  Yes    VAD Patient?  No    PAD/SET Patient?  No      Pain Assessment   Currently in Pain?  No/denies        Exercise Prescription Changes - 05/24/18 1600      Response to Exercise   Blood Pressure (Admit)  126/62    Blood Pressure (Exercise)  120/70    Blood Pressure (Exit)  128/62    Heart Rate (Admit)  79 bpm    Heart Rate (Exercise)  103 bpm    Heart Rate (Exit)  80 bpm    Rating of Perceived Exertion (Exercise)  14    Symptoms  fatigue    Comments  first full day of exercise    Duration  Progress to 45 minutes of aerobic exercise without signs/symptoms of physical distress    Intensity  THRR unchanged      Progression   Progression  Continue to progress workloads to maintain intensity without signs/symptoms of physical distress.    Average METs  1.39      Resistance Training   Training Prescription  Yes    Weight  2 lbs    Reps  10-15      Interval Training   Interval Training  No      Treadmill   MPH  1    Grade  0    Minutes  15    METs  1.77      Biostep-RELP   Level  1    Minutes  15    METs  1       Social History   Tobacco Use  Smoking Status Never  Smoker  Smokeless Tobacco Never Used    Goals Met:  Independence with exercise equipment Exercise tolerated well No report of cardiac concerns or symptoms Strength training completed today  Goals Unmet:  Not Applicable  Comments: Pt able to follow exercise prescription today without complaint.  Will continue to monitor for progression.    Dr. Emily Filbert is Medical Director for [place] and [facility].

## 2018-05-27 DIAGNOSIS — I5022 Chronic systolic (congestive) heart failure: Secondary | ICD-10-CM

## 2018-05-27 DIAGNOSIS — I110 Hypertensive heart disease with heart failure: Secondary | ICD-10-CM | POA: Diagnosis not present

## 2018-05-27 NOTE — Progress Notes (Signed)
Daily Session Note  Patient Details  Name: Melanie Holloway MRN: [id number] Date of Birth: 10/05/1966 Referring Provider:     Cardiac Rehab from 05/19/2018 in [facility] Cardiac and Pulmonary Rehab  Referring Provider  Paraschos      Encounter Date: 05/27/2018  Check In:      Social History   Tobacco Use  Smoking Status Never Smoker  Smokeless Tobacco Never Used    Goals Met:  Independence with exercise equipment Exercise tolerated well No report of cardiac concerns or symptoms Strength training completed today  Goals Unmet:  Not Applicable  Comments: Pt able to follow exercise prescription today without complaint.  Will continue to monitor for progression.    Dr. Emily Filbert is Medical Director for [place] and [facility].

## 2018-05-30 ENCOUNTER — Encounter: Payer: 59 | Admitting: *Deleted

## 2018-05-30 DIAGNOSIS — I5022 Chronic systolic (congestive) heart failure: Secondary | ICD-10-CM

## 2018-05-30 DIAGNOSIS — I110 Hypertensive heart disease with heart failure: Secondary | ICD-10-CM | POA: Diagnosis not present

## 2018-05-30 NOTE — Progress Notes (Signed)
Daily Session Note  Patient Details  Name: Melanie Holloway MRN: [id number] Date of Birth: 03/07/1966 Referring Provider:     Cardiac Rehab from 05/19/2018 in [facility] Cardiac and Pulmonary Rehab  Referring Provider  Paraschos      Encounter Date: 05/30/2018  Check In: Session Check In - 05/30/18 0744      Check-In   Supervising physician immediately available to respond to emergencies  See telemetry face sheet for immediately available ER MD    Location  [facility]-Cardiac & Pulmonary Rehab    Staff Present  Susanne Bice, RN, BSN, CCRP;Jessica [place], MA, RCEP, CCRP, Exercise Physiologist;Kelly Hayes, BS, ACSM CEP, Exercise Physiologist    Medication changes reported      No    Fall or balance concerns reported     No    Warm-up and Cool-down  Performed on first and last piece of equipment    Resistance Training Performed  Yes    VAD Patient?  No    PAD/SET Patient?  No      Pain Assessment   Currently in Pain?  No/denies          Social History   Tobacco Use  Smoking Status Never Smoker  Smokeless Tobacco Never Used    Goals Met:  Independence with exercise equipment Exercise tolerated well Personal goals reviewed No report of cardiac concerns or symptoms Strength training completed today  Goals Unmet:  Not Applicable  Comments: Pt able to follow exercise prescription today without complaint.  Will continue to monitor for progression.    Dr. Mark Miller is Medical Director for [facility] and [facility]. 

## 2018-06-01 ENCOUNTER — Encounter: Payer: 59 | Admitting: *Deleted

## 2018-06-01 ENCOUNTER — Encounter: Payer: Self-pay | Admitting: *Deleted

## 2018-06-01 DIAGNOSIS — I5022 Chronic systolic (congestive) heart failure: Secondary | ICD-10-CM

## 2018-06-01 DIAGNOSIS — I110 Hypertensive heart disease with heart failure: Secondary | ICD-10-CM | POA: Diagnosis not present

## 2018-06-01 NOTE — Progress Notes (Signed)
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 Prevention: - Provides verbal and written material to individual with discussion of falls prevention and safety.   Cardiac Rehab from 06/01/2018 in [facility] Cardiac and Pulmonary Rehab  Date  05/19/18  Educator  [facility]  Instruction Review Code  1- Verbalizes Understanding      Diabetes: - Individual verbal and written instruction to review signs/symptoms of diabetes, desired ranges of glucose level fasting, after meals and with exercise. Acknowledge that pre and post exercise glucose checks will be done for 3 sessions at entry of program.  Know Your Numbers and Risk Factors: -Group verbal and written instruction about important numbers in your health.  Discussion of what are risk factors and how they play a role in the disease process.  Review of Cholesterol, Blood Pressure, Diabetes, and BMI and the role they play in your overall health.   Cardiac Rehab from 06/01/2018 in [facility] Cardiac and Pulmonary Rehab  Date  06/01/18   Educator  [place]  Instruction Review Code  1- Verbalizes Understanding      Sleep Hygiene: -Provides group verbal and written instruction about how sleep can affect your health.  Define sleep hygiene, discuss sleep cycles and impact of sleep habits. Review good sleep hygiene tips.    Other: -Provides group and verbal instruction on various topics (see comments)   Knowledge Questionnaire Score: Knowledge Questionnaire Score - 05/19/18 1429      Knowledge Questionnaire Score   Pre Score  22/26   correct answers reviewed with Malachy Mood,. focus on Stress      Core Components/Risk Factors/Patient Goals at Admission: Personal Goals and Risk Factors at Admission - 05/19/18 1422      Core Components/Risk Factors/Patient Goals on Admission    Weight Management  Yes;Weight Maintenance    Intervention  Weight Management: Provide education and appropriate resources to help participant work on and attain dietary goals.;Weight Management: Develop a combined nutrition and exercise program designed to reach desired caloric intake, while maintaining appropriate intake of nutrient and fiber, sodium and fats, and appropriate energy expenditure required for the weight goal.    Admit Weight  141 lb (64 kg)    Expected Outcomes  Short Term: Continue to assess and modify interventions until short term weight is achieved;Long Term: Adherence to nutrition and physical activity/exercise program aimed toward attainment of established weight goal;Weight Maintenance: Understanding of the daily nutrition guidelines, which includes 25-35% calories from fat, 7% or less cal from saturated fats, less than 283m cholesterol, less than 1.5gm of sodium, & 5 or more servings of fruits and vegetables daily;Understanding recommendations for meals to include 15-35% energy as protein, 25-35% energy from fat, 35-60% energy from carbohydrates, less than 2019mof dietary cholesterol, 20-35 gm of total fiber daily;Understanding of  distribution of calorie intake throughout the day with the consumption of 4-5 meals/snacks    Hypertension  Yes    Intervention  Provide education on lifestyle modifcations including regular physical activity/exercise, weight management, moderate sodium restriction and increased consumption of fresh fruit, vegetables, and low fat dairy, alcohol moderation, and smoking cessation.;Monitor prescription use compliance.    Expected Outcomes  Short Term: Continued assessment and intervention until BP is < 140/9018mG in hypertensive participants. < 130/75m84m in hypertensive participants with diabetes, heart failure or chronic kidney disease.;Long Term: Maintenance of blood pressure at goal levels.       Core Components/Risk Factors/Patient Goals Review:    Core Components/Risk Factors/Patient Goals at Discharge (Final Review):    ITP Comments: ITP Comments    Row Name 05/19/18 1421 06/01/18 0755         ITP Comments  Med Review completed. Initial ITP created. Diagnosis can be found in [facility]  30 day review completed. ITP sent to Dr. BertRamonita Labvering for Dr. MarkEmily Filbertdical Director of Cardiac Rehab. Continue with ITP unless changes are made by physician.  New to program         Comments: 30 day review

## 2018-06-01 NOTE — Progress Notes (Signed)
Daily Session Note  Patient Details  Name: Melanie Holloway MRN: [id number] Date of Birth: 1966/04/06 Referring Provider:     Cardiac Rehab from 05/19/2018 in [facility] Cardiac and Pulmonary Rehab  Referring Provider  Paraschos      Encounter Date: 06/01/2018  Check In: Session Check In - 06/01/18 0746      Check-In   Supervising physician immediately available to respond to emergencies  See telemetry face sheet for immediately available ER MD    Location  [facility]-Cardiac & Pulmonary Rehab    Staff Present  Nyoka Cowden, RN, BSN, Cheri Fowler, RN BSN;Willetta York Luan Pulling, MA, RCEP, CCRP, Exercise Physiologist    Medication changes reported      No    Fall or balance concerns reported     No    Warm-up and Cool-down  Performed on first and last piece of equipment    Resistance Training Performed  Yes    VAD Patient?  No    PAD/SET Patient?  No      Pain Assessment   Currently in Pain?  No/denies          Social History   Tobacco Use  Smoking Status Never Smoker  Smokeless Tobacco Never Used    Goals Met:  Independence with exercise equipment Exercise tolerated well No report of cardiac concerns or symptoms Strength training completed today  Goals Unmet:  Not Applicable  Comments: Pt able to follow exercise prescription today without complaint.  Will continue to monitor for progression.    Dr. Emily Filbert is Medical Director for [place] and [facility].

## 2018-06-03 ENCOUNTER — Encounter: Payer: 59 | Admitting: *Deleted

## 2018-06-03 DIAGNOSIS — I110 Hypertensive heart disease with heart failure: Secondary | ICD-10-CM | POA: Diagnosis not present

## 2018-06-03 DIAGNOSIS — I5022 Chronic systolic (congestive) heart failure: Secondary | ICD-10-CM

## 2018-06-03 NOTE — Progress Notes (Signed)
Daily Session Note  Patient Details  Name: Kaili Castille MRN: [id number] Date of Birth: 1966/01/07 Referring Provider:     Cardiac Rehab from 05/19/2018 in [facility] Cardiac and Pulmonary Rehab  Referring Provider  Paraschos      Encounter Date: 06/03/2018  Check In: Session Check In - 06/03/18 0825      Check-In   Supervising physician immediately available to respond to emergencies  See telemetry face sheet for immediately available ER MD    Location  [facility]-Cardiac & Pulmonary Rehab    Staff Present  Renita Papa, RN BSN;Mark Benecke Luan Pulling, MA, RCEP, CCRP, Exercise Physiologist;Amanda Oletta Darter, [place], ACSM CEP, Exercise Physiologist    Medication changes reported      No    Fall or balance concerns reported     No    Warm-up and Cool-down  Performed on first and last piece of equipment    Resistance Training Performed  Yes    VAD Patient?  No    PAD/SET Patient?  No      Pain Assessment   Currently in Pain?  No/denies          Social History   Tobacco Use  Smoking Status Never Smoker  Smokeless Tobacco Never Used    Goals Met:  Independence with exercise equipment Exercise tolerated well No report of cardiac concerns or symptoms Strength training completed today  Goals Unmet:  Not Applicable  Comments: Pt able to follow exercise prescription today without complaint.  Will continue to monitor for progression. Reviewed home exercise with pt today.  Pt plans to walk and use stair climber at home for exercise.  Reviewed THR, pulse, RPE, sign and symptoms, NTG use, and when to call 911 or MD.  Also discussed weather considerations and indoor options.  Pt voiced understanding.    Dr. Emily Filbert is Medical Director for [place] and [facility].

## 2018-06-06 ENCOUNTER — Encounter: Payer: 59 | Admitting: *Deleted

## 2018-06-06 ENCOUNTER — Encounter: Payer: Self-pay | Admitting: Dietician

## 2018-06-06 DIAGNOSIS — I110 Hypertensive heart disease with heart failure: Secondary | ICD-10-CM | POA: Diagnosis not present

## 2018-06-06 DIAGNOSIS — I5022 Chronic systolic (congestive) heart failure: Secondary | ICD-10-CM

## 2018-06-06 NOTE — Progress Notes (Signed)
Daily Session Note  Patient Details  Name: Melanie Holloway MRN: [id number] Date of Birth: 11/25/1965 Referring Provider:     Cardiac Rehab from 05/19/2018 in [facility] Cardiac and Pulmonary Rehab  Referring Provider  Paraschos      Encounter Date: 06/06/2018  Check In: Session Check In - 06/06/18 0749      Check-In   Supervising physician immediately available to respond to emergencies  See telemetry face sheet for immediately available ER MD    Location  [facility]-Cardiac & Pulmonary Rehab    Staff Present  Carroll Enterkin, RN, BSN;Kelly Hayes, BS, ACSM CEP, Exercise Physiologist;Jessica Hawkins, MA, RCEP, CCRP, Exercise Physiologist    Medication changes reported      No    Fall or balance concerns reported     No    Tobacco Cessation  No Change    Warm-up and Cool-down  Performed on first and last piece of equipment    Resistance Training Performed  Yes    VAD Patient?  No    PAD/SET Patient?  No      Pain Assessment   Currently in Pain?  No/denies    Multiple Pain Sites  No          Social History   Tobacco Use  Smoking Status Never Smoker  Smokeless Tobacco Never Used    Goals Met:  Independence with exercise equipment Exercise tolerated well No report of cardiac concerns or symptoms Strength training completed today  Goals Unmet:  Not Applicable  Comments: Pt able to follow exercise prescription today without complaint.  Will continue to monitor for progression.    Dr. Mark Miller is Medical Director for [facility] and [facility]. 

## 2018-06-08 DIAGNOSIS — I5022 Chronic systolic (congestive) heart failure: Secondary | ICD-10-CM

## 2018-06-08 DIAGNOSIS — I110 Hypertensive heart disease with heart failure: Secondary | ICD-10-CM | POA: Diagnosis not present

## 2018-06-08 NOTE — Progress Notes (Signed)
Daily Session Note  Patient Details  Name: Melanie Holloway MRN: [id number] Date of Birth: [birth year] Referring Provider:     Cardiac Rehab from 05/19/2018 in [facility] Cardiac and Pulmonary Rehab  Referring Provider  Paraschos      Encounter Date: 06/08/2018  Check In: Session Check In - 06/08/18 0741      Check-In   Supervising physician immediately available to respond to emergencies  See telemetry face sheet for immediately available ER MD    Location  [facility]-Cardiac & Pulmonary Rehab    Staff Present  Alberteen Sam, MA, RCEP, CCRP, Exercise Physiologist;Carly Sabo Royal Piedra, RN BSN    Medication changes reported      No    Fall or balance concerns reported     No    Warm-up and Cool-down  Performed on first and last piece of equipment    Resistance Training Performed  Yes    VAD Patient?  No      Pain Assessment   Currently in Pain?  No/denies          Social History   Tobacco Use  Smoking Status Never Smoker  Smokeless Tobacco Never Used    Goals Met:  Independence with exercise equipment Exercise tolerated well No report of cardiac concerns or symptoms Strength training completed today  Goals Unmet:  Not Applicable  Comments: Pt able to follow exercise prescription today without complaint.  Will continue to monitor for progression.   Dr. Emily Filbert is Medical Director for [place] and [facility].

## 2018-06-09 ENCOUNTER — Telehealth: Payer: Self-pay

## 2018-06-09 NOTE — Telephone Encounter (Signed)
Left message on vm per dpr informing pt that if she had labs done before physical, then Dr. Darnell Level would be able to discuss the results during the physical.  However, it is ok for her to have labs done the same day.

## 2018-06-09 NOTE — Telephone Encounter (Signed)
Copied from [place] [id number]. Topic: Inquiry >> Jun 08, 2018 10:51 AM Oliver Pila B wrote: Reason for CRM: pt called to speak w/ pcp/nurse about labs that are scheduled for next week; contact pt to advise >> Jun 08, 2018  5:06 PM Ivar Drape wrote: Patient just wanted to know if she could have her labs on the same day as her physical.  So the lab appt was cancelled and she will have same day labs on 06/16/18

## 2018-06-10 ENCOUNTER — Encounter: Payer: 59 | Admitting: *Deleted

## 2018-06-10 DIAGNOSIS — I5022 Chronic systolic (congestive) heart failure: Secondary | ICD-10-CM

## 2018-06-10 DIAGNOSIS — I110 Hypertensive heart disease with heart failure: Secondary | ICD-10-CM | POA: Diagnosis not present

## 2018-06-10 NOTE — Progress Notes (Signed)
Daily Session Note  Patient Details  Name: Melanie Holloway MRN: [id number] Date of Birth: 05/18/1966 Referring Provider:     Cardiac Rehab from 05/19/2018 in [facility] Cardiac and Pulmonary Rehab  Referring Provider  Paraschos      Encounter Date: 06/10/2018  Check In: Session Check In - 06/10/18 0744      Check-In   Supervising physician immediately available to respond to emergencies  See telemetry face sheet for immediately available ER MD    Location  [facility]-Cardiac & Pulmonary Rehab    Staff Present  Renita Papa, RN BSN;Lailany Enoch Luan Pulling, MA, RCEP, CCRP, Exercise Physiologist;Amanda Oletta Darter, [place], ACSM CEP, Exercise Physiologist    Medication changes reported      No    Fall or balance concerns reported     No    Warm-up and Cool-down  Performed on first and last piece of equipment    Resistance Training Performed  Yes    VAD Patient?  No    PAD/SET Patient?  No      Pain Assessment   Currently in Pain?  No/denies          Social History   Tobacco Use  Smoking Status Never Smoker  Smokeless Tobacco Never Used    Goals Met:  Independence with exercise equipment Exercise tolerated well No report of cardiac concerns or symptoms Strength training completed today  Goals Unmet:  Not Applicable  Comments: Pt able to follow exercise prescription today without complaint.  Will continue to monitor for progression.    Dr. Emily Filbert is Medical Director for [place] and [facility].

## 2018-06-12 ENCOUNTER — Other Ambulatory Visit: Payer: Self-pay | Admitting: Family Medicine

## 2018-06-12 DIAGNOSIS — M320 Drug-induced systemic lupus erythematosus: Secondary | ICD-10-CM

## 2018-06-12 DIAGNOSIS — E785 Hyperlipidemia, unspecified: Secondary | ICD-10-CM | POA: Insufficient documentation

## 2018-06-12 DIAGNOSIS — E559 Vitamin D deficiency, unspecified: Secondary | ICD-10-CM

## 2018-06-13 ENCOUNTER — Other Ambulatory Visit: Payer: 59

## 2018-06-13 ENCOUNTER — Telehealth: Payer: Self-pay | Admitting: *Deleted

## 2018-06-13 NOTE — Telephone Encounter (Signed)
Melanie Holloway called to let us know that her foot was swollen and that she wasn't able to walk today.  If she is feeling better, she is hoping to try to come tomorrow.

## 2018-06-15 DIAGNOSIS — I5022 Chronic systolic (congestive) heart failure: Secondary | ICD-10-CM

## 2018-06-15 DIAGNOSIS — I110 Hypertensive heart disease with heart failure: Secondary | ICD-10-CM | POA: Diagnosis not present

## 2018-06-15 NOTE — Progress Notes (Signed)
Daily Session Note  Patient Details  Name: Melanie Holloway MRN: [id number] Date of Birth: 1966-02-28 Referring Provider:     Cardiac Rehab from 05/19/2018 in [facility] Cardiac and Pulmonary Rehab  Referring Provider  Paraschos      Encounter Date: 06/15/2018  Check In: Session Check In - 06/15/18 0740      Check-In   Supervising physician immediately available to respond to emergencies  See telemetry face sheet for immediately available ER MD    Location  [facility]-Cardiac & Pulmonary Rehab    Staff Present  Alberteen Sam, MA, RCEP, CCRP, Exercise Physiologist;Joseph Christain [place], RN BSN    Medication changes reported      No    Fall or balance concerns reported     No    Warm-up and Cool-down  Performed as group-led Higher education careers adviser Performed  Yes    VAD Patient?  No      Pain Assessment   Currently in Pain?  No/denies          Social History   Tobacco Use  Smoking Status Never Smoker  Smokeless Tobacco Never Used    Goals Met:  Independence with exercise equipment Exercise tolerated well No report of cardiac concerns or symptoms Strength training completed today  Goals Unmet:  Not Applicable  Comments: Pt able to follow exercise prescription today without complaint.  Will continue to monitor for progression.   Dr. Emily Filbert is Medical Director for [place] and [facility].

## 2018-06-16 ENCOUNTER — Ambulatory Visit (INDEPENDENT_AMBULATORY_CARE_PROVIDER_SITE_OTHER): Payer: 59 | Admitting: Family Medicine

## 2018-06-16 ENCOUNTER — Encounter: Payer: Self-pay | Admitting: Family Medicine

## 2018-06-16 VITALS — BP 116/68 | HR 74 | Temp 98.3°F | Ht <= 58 in | Wt 141.5 lb

## 2018-06-16 DIAGNOSIS — Z170 Estrogen receptor positive status [ER+]: Secondary | ICD-10-CM

## 2018-06-16 DIAGNOSIS — Z23 Encounter for immunization: Secondary | ICD-10-CM

## 2018-06-16 DIAGNOSIS — L930 Discoid lupus erythematosus: Secondary | ICD-10-CM

## 2018-06-16 DIAGNOSIS — E559 Vitamin D deficiency, unspecified: Secondary | ICD-10-CM

## 2018-06-16 DIAGNOSIS — M320 Drug-induced systemic lupus erythematosus: Secondary | ICD-10-CM | POA: Diagnosis not present

## 2018-06-16 DIAGNOSIS — E785 Hyperlipidemia, unspecified: Secondary | ICD-10-CM | POA: Diagnosis not present

## 2018-06-16 DIAGNOSIS — I5022 Chronic systolic (congestive) heart failure: Secondary | ICD-10-CM

## 2018-06-16 DIAGNOSIS — Z0000 Encounter for general adult medical examination without abnormal findings: Secondary | ICD-10-CM

## 2018-06-16 DIAGNOSIS — R202 Paresthesia of skin: Secondary | ICD-10-CM

## 2018-06-16 DIAGNOSIS — I10 Essential (primary) hypertension: Secondary | ICD-10-CM

## 2018-06-16 DIAGNOSIS — M3214 Glomerular disease in systemic lupus erythematosus: Secondary | ICD-10-CM

## 2018-06-16 DIAGNOSIS — C50512 Malignant neoplasm of lower-outer quadrant of left female breast: Secondary | ICD-10-CM

## 2018-06-16 DIAGNOSIS — E669 Obesity, unspecified: Secondary | ICD-10-CM

## 2018-06-16 LAB — CBC WITH DIFFERENTIAL/PLATELET
BASOS ABS: 0 10*3/uL (ref 0.0–0.1)
Basophils Relative: 0.3 % (ref 0.0–3.0)
EOS ABS: 0.1 10*3/uL (ref 0.0–0.7)
Eosinophils Relative: 2.6 % (ref 0.0–5.0)
HEMATOCRIT: 28.4 % — AB (ref 36.0–46.0)
Hemoglobin: 9.7 g/dL — ABNORMAL LOW (ref 12.0–15.0)
LYMPHS PCT: 29.8 % (ref 12.0–46.0)
Lymphs Abs: 0.8 10*3/uL (ref 0.7–4.0)
MCHC: 34.3 g/dL (ref 30.0–36.0)
MCV: 84.3 fl (ref 78.0–100.0)
Monocytes Absolute: 0.4 10*3/uL (ref 0.1–1.0)
Monocytes Relative: 14.4 % — ABNORMAL HIGH (ref 3.0–12.0)
NEUTROS ABS: 1.4 10*3/uL (ref 1.4–7.7)
Neutrophils Relative %: 52.9 % (ref 43.0–77.0)
PLATELETS: 190 10*3/uL (ref 150.0–400.0)
RBC: 3.37 Mil/uL — ABNORMAL LOW (ref 3.87–5.11)
RDW: 14.6 % (ref 11.5–15.5)
WBC: 2.6 10*3/uL — ABNORMAL LOW (ref 4.0–10.5)

## 2018-06-16 LAB — LIPID PANEL
CHOL/HDL RATIO: 5
Cholesterol: 179 mg/dL (ref 0–200)
HDL: 38.4 mg/dL — ABNORMAL LOW (ref >39.00)
LDL CALC: 103 mg/dL — AB (ref 0–99)
NonHDL: 140.83
TRIGLYCERIDES: 188 mg/dL — AB (ref 0.0–149.0)
VLDL: 37.6 mg/dL (ref 0.0–40.0)

## 2018-06-16 LAB — COMPREHENSIVE METABOLIC PANEL
ALT: 11 U/L (ref 0–35)
AST: 21 U/L (ref 0–37)
Albumin: 3.4 g/dL — ABNORMAL LOW (ref 3.5–5.2)
Alkaline Phosphatase: 94 U/L (ref 39–117)
BILIRUBIN TOTAL: 0.4 mg/dL (ref 0.2–1.2)
BUN: 64 mg/dL — AB (ref 6–23)
CALCIUM: 9.3 mg/dL (ref 8.4–10.5)
CO2: 29 meq/L (ref 19–32)
CREATININE: 1.88 mg/dL — AB (ref 0.40–1.20)
Chloride: 104 mEq/L (ref 96–112)
GFR: 35.91 mL/min — ABNORMAL LOW (ref >60.00)
GLUCOSE: 79 mg/dL (ref 70–99)
Potassium: 4 mEq/L (ref 3.5–5.1)
Sodium: 140 mEq/L (ref 135–145)
Total Protein: 7.3 g/dL (ref 6.0–8.3)

## 2018-06-16 LAB — VITAMIN B12: Vitamin B-12: 451 pg/mL (ref 211–911)

## 2018-06-16 LAB — VITAMIN D 25 HYDROXY (VIT D DEFICIENCY, FRACTURES): VITD: 44.97 ng/mL (ref 30.00–100.00)

## 2018-06-16 NOTE — Assessment & Plan Note (Signed)
Uses TCI cream

## 2018-06-16 NOTE — Assessment & Plan Note (Signed)
Preventative protocols reviewed and updated unless pt declined. Discussed healthy diet and lifestyle.  

## 2018-06-16 NOTE — Assessment & Plan Note (Signed)
With possible bony mets, on femara. Sees onc yearly.

## 2018-06-16 NOTE — Assessment & Plan Note (Signed)
Update labs today 

## 2018-06-16 NOTE — Assessment & Plan Note (Signed)
Appreciate rheum and nephrology care.

## 2018-06-16 NOTE — Assessment & Plan Note (Signed)
Stable period. Participating in regular exercise through cardiac rehab 3 times a week.

## 2018-06-16 NOTE — Addendum Note (Signed)
Addended by: Brenton Grills on: 3/49/4944 73:95 AM   Modules accepted: Orders

## 2018-06-16 NOTE — Assessment & Plan Note (Signed)
Stable period on torsemide. Not on entresto due to lupus nephritis. Sees cards regularly, now attending cardiac rehab.

## 2018-06-16 NOTE — Progress Notes (Signed)
BP 116/68 (BP Location: Left Arm, Patient Position: Sitting, Cuff Size: Normal)   Pulse 74   Temp 98.3 F (36.8 C) (Oral)   Ht 4\' 9"  (1.448 m)   Wt 141 lb 8 oz (64.2 kg)   LMP 03/04/2009 (Approximate)   SpO2 99%   BMI 30.62 kg/m    CC: CPE Subjective:    Patient ID: Melanie Holloway, female    DOB: September 19, 1966, 52 y.o.   MRN: [id number]  HPI: Melanie Holloway is a 52 y.o. female presenting on 06/16/2018 for Annual Exam   Recently saw cards (Paraschos) and onc (Magrinat). Also sees nephrology [facility]) and rheumatology Jefm Bryant). Notes reviewed. Planned letrozole (femara) indefinitely. Followed for SLE, nonischemic dilated CM, chronic sCHF and breast cancer possibly metastatic to bone alone but overall stable. She has recently started participating in heart track (cardiac rehab). She feels stamina is improving, improving dyspnea. She has found out she has been approved for disability. Upcoming rheum appt next month. Currently undergoing prednisone taper - down to 5mg  daily.  Occasional numbness of hands noted along median distribution.   Prevenative: Well woman exam with OBGYN - with Dr. Kennon Rounds 2016. Normal pap smear. Due for f/u - encouraged she call to schedule appt.  LMP 2009 - chemo led to early menopause Breast cancer - 8 yrs free. Upcoming mammogram next week.  Flu shot - yearly Tdap 2016 Seat belt use discussed Sunscreen use discussed. No changing moles on skin.  Non smoker Alcohol - none Dentist - doesn't see Eye exam - yearly  Caffeine: occasional coffee and soda  Lives with husband and 14 yo child  Occupation: business Surveyor, minerals  Activity: walks with cardiac rehab - improving. Diet: good water, fruits/vegetables daily  Relevant past medical, surgical, family and social history reviewed and updated as indicated. Interim medical history since our last visit reviewed. Allergies and medications reviewed and updated. Outpatient Medications Prior to Visit  Medication  Sig Dispense Refill  . acetaminophen (TYLENOL) 325 MG tablet Take 2 tablets (650 mg total) by mouth every 4 (four) hours as needed for headache or mild pain. 30 tablet 0  . Cholecalciferol (VITAMIN D) 2000 UNITS CAPS Take 1 capsule (2,000 Units total) by mouth daily. 30 capsule   . hydroxychloroquine (PLAQUENIL) 200 MG tablet Take 1 tablet (200 mg total) by mouth daily. 60 tablet 2  . letrozole (FEMARA) 2.5 MG tablet Take 1 tablet (2.5 mg total) by mouth daily. 90 tablet 4  . Magnesium 400 MG CAPS Take 400 mg by mouth daily. 30 capsule 0  . metoprolol succinate (TOPROL-XL) 50 MG 24 hr tablet Take 1 tablet (50 mg total) by mouth daily. Take with or immediately following a meal. 30 tablet 0  . Multiple Vitamin (MULTIVITAMIN WITH MINERALS) TABS tablet Take 1 tablet by mouth daily. 30 tablet 0  . mycophenolate (CELLCEPT) 250 MG capsule Take 2 capsules (500 mg total) by mouth 2 (two) times daily. 60 capsule 0  . potassium chloride SA (K-DUR,KLOR-CON) 20 MEQ tablet Take 1 tablet (20 mEq total) by mouth daily. (first 5 days take twice daily) 31 tablet 6  . torsemide (DEMADEX) 20 MG tablet Take 2 tablets (40 mg total) by mouth 2 (two) times daily. 60 tablet 0  . predniSONE (DELTASONE) 10 MG tablet Take 1 tablet (10 mg total) by mouth daily. 30 tablet 0  . predniSONE (DELTASONE) 5 MG tablet Take 1 tablet (5 mg total) by mouth daily with breakfast.     No facility-administered medications prior  to visit.      Per HPI unless specifically indicated in ROS section below Review of Systems  Constitutional: Negative for activity change, appetite change, chills, fatigue, fever and unexpected weight change.  HENT: Negative for hearing loss.   Eyes: Negative for visual disturbance.  Respiratory: Positive for cough. Negative for chest tightness, shortness of breath and wheezing.   Cardiovascular: Negative for chest pain, palpitations and leg swelling.  Gastrointestinal: Negative for abdominal distention,  abdominal pain, blood in stool, constipation, diarrhea, nausea and vomiting.  Genitourinary: Negative for difficulty urinating and hematuria.  Musculoskeletal: Negative for arthralgias, myalgias and neck pain.  Skin: Negative for rash.  Neurological: Negative for dizziness, seizures, syncope and headaches.  Hematological: Negative for adenopathy. Does not bruise/bleed easily.  Psychiatric/Behavioral: Negative for dysphoric mood. The patient is not nervous/anxious.        Objective:    BP 116/68 (BP Location: Left Arm, Patient Position: Sitting, Cuff Size: Normal)   Pulse 74   Temp 98.3 F (36.8 C) (Oral)   Ht 4\' 9"  (1.448 m)   Wt 141 lb 8 oz (64.2 kg)   LMP 03/04/2009 (Approximate)   SpO2 99%   BMI 30.62 kg/m   Wt Readings from Last 3 Encounters:  06/16/18 141 lb 8 oz (64.2 kg)  05/19/18 141 lb 1.6 oz (64 kg)  04/12/18 141 lb 1.6 oz (64 kg)    Physical Exam  Constitutional: She is oriented to person, place, and time. She appears well-developed and well-nourished. No distress.  HENT:  Head: Normocephalic and atraumatic.  Right Ear: Hearing, tympanic membrane, external ear and ear canal normal.  Left Ear: Hearing, tympanic membrane, external ear and ear canal normal.  Nose: Nose normal.  Mouth/Throat: Uvula is midline, oropharynx is clear and moist and mucous membranes are normal. No oropharyngeal exudate, posterior oropharyngeal edema or posterior oropharyngeal erythema.  Eyes: Pupils are equal, round, and reactive to light. Conjunctivae and EOM are normal. No scleral icterus.  Neck: Normal range of motion. Neck supple. No thyromegaly present.  Cardiovascular: Normal rate, regular rhythm, normal heart sounds and intact distal pulses.  No murmur heard. Pulses:      Radial pulses are 2+ on the right side, and 2+ on the left side.  Pulmonary/Chest: Effort normal and breath sounds normal. No respiratory distress. She has no wheezes. She has no rales.  Abdominal: Soft. Bowel  sounds are normal. She exhibits no distension and no mass. There is no tenderness. There is no rebound and no guarding.  Musculoskeletal: Normal range of motion. She exhibits no edema.  Lymphadenopathy:    She has no cervical adenopathy.  Neurological: She is alert and oriented to person, place, and time.  CN grossly intact, station and gait intact Neg tinel and phalen  Skin: Skin is warm and dry. No rash noted.  Psychiatric: She has a normal mood and affect. Her behavior is normal. Judgment and thought content normal.  Nursing note and vitals reviewed.  Results for orders placed or performed in visit on 04/12/18  CMP ([place] only)  Result Value Ref Range   Sodium 136 135 - 145 mmol/L   Potassium 3.4 (L) 3.5 - 5.1 mmol/L   Chloride 104 98 - 111 mmol/L   CO2 22 22 - 32 mmol/L   Glucose, Bld 95 70 - 99 mg/dL   BUN 57 (H) 6 - 20 mg/dL   Creatinine 1.79 (H) 0.44 - 1.00 mg/dL   Calcium 8.7 (L) 8.9 - 10.3 mg/dL  Total Protein 6.7 6.5 - 8.1 g/dL   Albumin 2.8 (L) 3.5 - 5.0 g/dL   AST 24 15 - 41 U/L   ALT 15 0 - 44 U/L   Alkaline Phosphatase 131 (H) 38 - 126 U/L   Total Bilirubin 0.3 0.3 - 1.2 mg/dL   GFR, Est Non Af Am 31 (L) >60 mL/min   GFR, Est AFR Am 36 (L) >60 mL/min   Anion gap 10 5 - 15  CBC with Differential (Cancer Center Only)  Result Value Ref Range   WBC Count 4.5 3.9 - 10.3 K/uL   RBC 3.38 (L) 3.70 - 5.45 MIL/uL   Hemoglobin 9.7 (L) 11.6 - 15.9 g/dL   HCT 29.5 (L) 34.8 - 46.6 %   MCV 87.1 79.5 - 101.0 fL   MCH 28.8 25.1 - 34.0 pg   MCHC 33.0 31.5 - 36.0 g/dL   RDW 17.6 (H) 11.2 - 14.5 %   Platelet Count 214 145 - 400 K/uL   Neutrophils Relative % 74 %   Neutro Abs 3.4 1.5 - 6.5 K/uL   Lymphocytes Relative 17 %   Lymphs Abs 0.7 (L) 0.9 - 3.3 K/uL   Monocytes Relative 7 %   Monocytes Absolute 0.3 0.1 - 0.9 K/uL   Eosinophils Relative 1 %   Eosinophils Absolute 0.0 0.0 - 0.5 K/uL   Basophils Relative 1 %   Basophils Absolute 0.0 0.0 - 0.1 K/uL        Assessment & Plan:   Problem List Items Addressed This Visit    Vitamin D deficiency    Update labs today.       Systemic lupus erythematosus ([place])    Appreciate rheum care. On plaquenil and cellcept, states she's on prednisone taper down to 5mg  at this time. F/u planned next month.       Obesity, Class I, BMI 30.0-34.9 (see actual BMI)    Stable period. Participating in regular exercise through cardiac rehab 3 times a week.       Malignant neoplasm of lower-outer quadrant of left breast of female, estrogen receptor positive ([place])    With possible bony mets, on femara. Sees onc yearly.       Relevant Medications   predniSONE (DELTASONE) 5 MG tablet   Lupus nephritis, ISN/RPS class V ([place])    Appreciate rheum and nephrology care.       Lupus erythematosus tumidus    Uses TCI cream      Health maintenance examination - Primary    Preventative protocols reviewed and updated unless pt declined. Discussed healthy diet and lifestyle.       Essential hypertension    Chronic, stable. Continue toprol XL and torsemide 40mg  bid       Dyslipidemia    Chronic. Update FLP. Not on statin. The ASCVD Risk score Mikey Bussing DC Jr., et al., 2013) failed to calculate for the following reasons:   The patient has a prior MI or stroke diagnosis       Chronic HFrEF (heart failure with reduced ejection fraction) (HCC)    Stable period on torsemide. Not on entresto due to lupus nephritis. Sees cards regularly, now attending cardiac rehab.        Other Visit Diagnoses    Paresthesia of both hands       Relevant Orders   Vitamin B12       No orders of the defined types were placed in this encounter.  Orders Placed This Encounter  Procedures  .  Vitamin B12    Follow up plan: Return in about 6 months (around 12/17/2018) for follow up visit.  Ria Bush, MD

## 2018-06-16 NOTE — Assessment & Plan Note (Signed)
Chronic. Update FLP. Not on statin. The ASCVD Risk score Mikey Bussing DC Jr., et al., 2013) failed to calculate for the following reasons:   The patient has a prior MI or stroke diagnosis

## 2018-06-16 NOTE — Assessment & Plan Note (Signed)
Appreciate rheum care. On plaquenil and cellcept, states she's on prednisone taper down to 5mg  at this time. F/u planned next month.

## 2018-06-16 NOTE — Patient Instructions (Addendum)
Call Dr Virginia Crews office at [facility] to schedule well woman exam.  Flu shot today Labs today.  Health Maintenance, Female Adopting a healthy lifestyle and getting preventive care can go a long way to promote health and wellness. Talk with your health care provider about what schedule of regular examinations is right for you. This is a good chance for you to check in with your provider about disease prevention and staying healthy. In between checkups, there are plenty of things you can do on your own. Experts have done a lot of research about which lifestyle changes and preventive measures are most likely to keep you healthy. Ask your health care provider for more information. Weight and diet Eat a healthy diet  Be sure to include plenty of vegetables, fruits, low-fat dairy products, and lean protein.  Do not eat a lot of foods high in solid fats, added sugars, or salt.  Get regular exercise. This is one of the most important things you can do for your health. ? Most adults should exercise for at least 150 minutes each week. The exercise should increase your heart rate and make you sweat (moderate-intensity exercise). ? Most adults should also do strengthening exercises at least twice a week. This is in addition to the moderate-intensity exercise.  Maintain a healthy weight  Body mass index (BMI) is a measurement that can be used to identify possible weight problems. It estimates body fat based on height and weight. Your health care provider can help determine your BMI and help you achieve or maintain a healthy weight.  For females 21 years of age and older: ? A BMI below 18.5 is considered underweight. ? A BMI of 18.5 to 24.9 is normal. ? A BMI of 25 to 29.9 is considered overweight. ? A BMI of 30 and above is considered obese.  Watch levels of cholesterol and blood lipids  You should start having your blood tested for lipids and cholesterol at 53 years of age, then have this test  every 5 years.  You may need to have your cholesterol levels checked more often if: ? Your lipid or cholesterol levels are high. ? You are older than 54 years of age. ? You are at high risk for heart disease.  Cancer screening Lung Cancer  Lung cancer screening is recommended for adults 33-57 years old who are at high risk for lung cancer because of a history of smoking.  A yearly low-dose CT scan of the lungs is recommended for people who: ? Currently smoke. ? Have quit within the past 15 years. ? Have at least a 30-pack-year history of smoking. A pack year is smoking an average of one pack of cigarettes a day for 1 year.  Yearly screening should continue until it has been 15 years since you quit.  Yearly screening should stop if you develop a health problem that would prevent you from having lung cancer treatment.  Breast Cancer  Practice breast self-awareness. This means understanding how your breasts normally appear and feel.  It also means doing regular breast self-exams. Let your health care provider know about any changes, no matter how small.  If you are in your 20s or 30s, you should have a clinical breast exam (CBE) by a health care provider every 1-3 years as part of a regular health exam.  If you are 42 or older, have a CBE every year. Also consider having a breast X-ray (mammogram) every year.  If you have a family  history of breast cancer, talk to your health care provider about genetic screening.  If you are at high risk for breast cancer, talk to your health care provider about having an MRI and a mammogram every year.  Breast cancer gene (BRCA) assessment is recommended for women who have family members with BRCA-related cancers. BRCA-related cancers include: ? Breast. ? Ovarian. ? Tubal. ? Peritoneal cancers.  Results of the assessment will determine the need for genetic counseling and BRCA1 and BRCA2 testing.  Cervical Cancer Your health care provider  may recommend that you be screened regularly for cancer of the pelvic organs (ovaries, uterus, and vagina). This screening involves a pelvic examination, including checking for microscopic changes to the surface of your cervix (Pap test). You may be encouraged to have this screening done every 3 years, beginning at age 66.  For women ages 65-65, health care providers may recommend pelvic exams and Pap testing every 3 years, or they may recommend the Pap and pelvic exam, combined with testing for human papilloma virus (HPV), every 5 years. Some types of HPV increase your risk of cervical cancer. Testing for HPV may also be done on women of any age with unclear Pap test results.  Other health care providers may not recommend any screening for nonpregnant women who are considered low risk for pelvic cancer and who do not have symptoms. Ask your health care provider if a screening pelvic exam is right for you.  If you have had past treatment for cervical cancer or a condition that could lead to cancer, you need Pap tests and screening for cancer for at least 20 years after your treatment. If Pap tests have been discontinued, your risk factors (such as having a new sexual partner) need to be reassessed to determine if screening should resume. Some women have medical problems that increase the chance of getting cervical cancer. In these cases, your health care provider may recommend more frequent screening and Pap tests.  Colorectal Cancer  This type of cancer can be detected and often prevented.  Routine colorectal cancer screening usually begins at 53 years of age and continues through 53 years of age.  Your health care provider may recommend screening at an earlier age if you have risk factors for colon cancer.  Your health care provider may also recommend using home test kits to check for hidden blood in the stool.  A small camera at the end of a tube can be used to examine your colon directly  (sigmoidoscopy or colonoscopy). This is done to check for the earliest forms of colorectal cancer.  Routine screening usually begins at age 45.  Direct examination of the colon should be repeated every 5-10 years through 53 years of age. However, you may need to be screened more often if early forms of precancerous polyps or small growths are found.  Skin Cancer  Check your skin from head to toe regularly.  Tell your health care provider about any new moles or changes in moles, especially if there is a change in a mole's shape or color.  Also tell your health care provider if you have a mole that is larger than the size of a pencil eraser.  Always use sunscreen. Apply sunscreen liberally and repeatedly throughout the day.  Protect yourself by wearing long sleeves, pants, a wide-brimmed hat, and sunglasses whenever you are outside.  Heart disease, diabetes, and high blood pressure  High blood pressure causes heart disease and increases the risk of stroke.  High blood pressure is more likely to develop in: ? People who have blood pressure in the high end of the normal range (130-139/85-89 mm Hg). ? People who are overweight or obese. ? People who are African American.  If you are 56-32 years of age, have your blood pressure checked every 3-5 years. If you are 5 years of age or older, have your blood pressure checked every year. You should have your blood pressure measured twice-once when you are at a hospital or clinic, and once when you are not at a hospital or clinic. Record the average of the two measurements. To check your blood pressure when you are not at a hospital or clinic, you can use: ? An automated blood pressure machine at a pharmacy. ? A home blood pressure monitor.  If you are between 51 years and 59 years old, ask your health care provider if you should take aspirin to prevent strokes.  Have regular diabetes screenings. This involves taking a blood sample to check your  fasting blood sugar level. ? If you are at a normal weight and have a low risk for diabetes, have this test once every three years after 53 years of age. ? If you are overweight and have a high risk for diabetes, consider being tested at a younger age or more often. Preventing infection Hepatitis B  If you have a higher risk for hepatitis B, you should be screened for this virus. You are considered at high risk for hepatitis B if: ? You were born in a country where hepatitis B is common. Ask your health care provider which countries are considered high risk. ? Your parents were born in a high-risk country, and you have not been immunized against hepatitis B (hepatitis B vaccine). ? You have HIV or AIDS. ? You use needles to inject street drugs. ? You live with someone who has hepatitis B. ? You have had sex with someone who has hepatitis B. ? You get hemodialysis treatment. ? You take certain medicines for conditions, including cancer, organ transplantation, and autoimmune conditions.  Hepatitis C  Blood testing is recommended for: ? Everyone born from [birth year] through 1966. ? Anyone with known risk factors for hepatitis C.  Sexually transmitted infections (STIs)  You should be screened for sexually transmitted infections (STIs) including gonorrhea and chlamydia if: ? You are sexually active and are younger than 53 years of age. ? You are older than 53 years of age and your health care provider tells you that you are at risk for this type of infection. ? Your sexual activity has changed since you were last screened and you are at an increased risk for chlamydia or gonorrhea. Ask your health care provider if you are at risk.  If you do not have HIV, but are at risk, it may be recommended that you take a prescription medicine daily to prevent HIV infection. This is called pre-exposure prophylaxis (PrEP). You are considered at risk if: ? You are sexually active and do not regularly use condoms  or know the HIV status of your partner(s). ? You take drugs by injection. ? You are sexually active with a partner who has HIV.  Talk with your health care provider about whether you are at high risk of being infected with HIV. If you choose to begin PrEP, you should first be tested for HIV. You should then be tested every 3 months for as long as you are taking PrEP. Pregnancy  If you  are premenopausal and you may become pregnant, ask your health care provider about preconception counseling.  If you may become pregnant, take 400 to 800 micrograms (mcg) of folic acid every day.  If you want to prevent pregnancy, talk to your health care provider about birth control (contraception). Osteoporosis and menopause  Osteoporosis is a disease in which the bones lose minerals and strength with aging. This can result in serious bone fractures. Your risk for osteoporosis can be identified using a bone density scan.  If you are 76 years of age or older, or if you are at risk for osteoporosis and fractures, ask your health care provider if you should be screened.  Ask your health care provider whether you should take a calcium or vitamin D supplement to lower your risk for osteoporosis.  Menopause may have certain physical symptoms and risks.  Hormone replacement therapy may reduce some of these symptoms and risks. Talk to your health care provider about whether hormone replacement therapy is right for you. Follow these instructions at home:  Schedule regular health, dental, and eye exams.  Stay current with your immunizations.  Do not use any tobacco products including cigarettes, chewing tobacco, or electronic cigarettes.  If you are pregnant, do not drink alcohol.  If you are breastfeeding, limit how much and how often you drink alcohol.  Limit alcohol intake to no more than 1 drink per day for nonpregnant women. One drink equals 12 ounces of beer, 5 ounces of wine, or 1 ounces of hard  liquor.  Do not use street drugs.  Do not share needles.  Ask your health care provider for help if you need support or information about quitting drugs.  Tell your health care provider if you often feel depressed.  Tell your health care provider if you have ever been abused or do not feel safe at home. This information is not intended to replace advice given to you by your health care provider. Make sure you discuss any questions you have with your health care provider. Document Released: 04/20/2011 Document Revised: 03/12/2016 Document Reviewed: 07/09/2015 Elsevier Interactive Patient Education  [employer].

## 2018-06-16 NOTE — Assessment & Plan Note (Addendum)
Chronic, stable. Continue toprol XL and torsemide 40mg  bid

## 2018-06-17 ENCOUNTER — Encounter: Payer: 59 | Admitting: *Deleted

## 2018-06-17 DIAGNOSIS — I5022 Chronic systolic (congestive) heart failure: Secondary | ICD-10-CM

## 2018-06-17 DIAGNOSIS — I110 Hypertensive heart disease with heart failure: Secondary | ICD-10-CM | POA: Diagnosis not present

## 2018-06-17 NOTE — Progress Notes (Signed)
Daily Session Note  Patient Details  Name: Melanie Holloway MRN: [id number] Date of Birth: 28-Jan-1966 Referring Provider:     Cardiac Rehab from 05/19/2018 in [facility]  Referring Provider  Paraschos      Encounter Date: 06/17/2018  Check In: Session Check In - 06/17/18 0807      Check-In   Supervising physician immediately available to respond to emergencies  See telemetry face sheet for immediately available ER MD    Location  [facility]-Cardiac & Pulmonary Rehab    Staff Present  Nyoka Cowden, RN, BSN, [place] Pa, MA, RCEP, CCRP, Exercise Physiologist;Amanda Oletta Darter, [place], ACSM CEP, Exercise Physiologist    Medication changes reported      No    Fall or balance concerns reported     No    Warm-up and Cool-down  Performed on first and last piece of equipment    Resistance Training Performed  Yes    VAD Patient?  No    PAD/SET Patient?  No      Pain Assessment   Currently in Pain?  No/denies          Social History   Tobacco Use  Smoking Status Never Smoker  Smokeless Tobacco Never Used    Goals Met:  Independence with exercise equipment Exercise tolerated well No report of cardiac concerns or symptoms Strength training completed today  Goals Unmet:  Not Applicable  Comments: Pt able to follow exercise prescription today without complaint.  Will continue to monitor for progression.    Dr. Emily Filbert is Medical Director for [place] and [facility].

## 2018-06-22 ENCOUNTER — Encounter: Payer: 59 | Attending: Cardiology

## 2018-06-22 ENCOUNTER — Ambulatory Visit: Payer: 59

## 2018-06-22 DIAGNOSIS — I110 Hypertensive heart disease with heart failure: Secondary | ICD-10-CM | POA: Diagnosis not present

## 2018-06-22 DIAGNOSIS — I5022 Chronic systolic (congestive) heart failure: Secondary | ICD-10-CM | POA: Insufficient documentation

## 2018-06-22 DIAGNOSIS — Z79899 Other long term (current) drug therapy: Secondary | ICD-10-CM | POA: Diagnosis not present

## 2018-06-22 DIAGNOSIS — Z853 Personal history of malignant neoplasm of breast: Secondary | ICD-10-CM | POA: Insufficient documentation

## 2018-06-22 DIAGNOSIS — M3214 Glomerular disease in systemic lupus erythematosus: Secondary | ICD-10-CM | POA: Insufficient documentation

## 2018-06-22 NOTE — Progress Notes (Signed)
Daily Session Note  Patient Details  Name: Melanie Holloway MRN: [id number] Date of Birth: 1965/12/17 Referring Provider:     Cardiac Rehab from 05/19/2018 in [facility] Cardiac and Pulmonary Rehab  Referring Provider  Paras[place]      Encounter Date: 06/22/2018  Check In: Session Check In - 06/22/18 0738      Check-In   Supervising physician immediately available to respond to emergencies  See telemetry face sheet for immediately available ER MD    Location  [facility]-Cardiac & Pulmonary Rehab    Staff Present  Alberteen Sam, MA, RCEP, CCRP, Exercise Physiologist;Briasia Flinders RCP,RRT,BSRT;Mary Kellie Shropshire, RN, BSN, MA    Medication changes reported      No    Fall or balance concerns reported     No    Warm-up and Cool-down  Performed on first and last piece of equipment    Resistance Training Performed  Yes    VAD Patient?  No      Pain Assessment   Currently in Pain?  No/denies          Social History   Tobacco Use  Smoking Status Never Smoker  Smokeless Tobacco Never Used    Goals Met:  Independence with exercise equipment Exercise tolerated well No report of cardiac concerns or symptoms Strength training completed today  Goals Unmet:  Not Applicable  Comments: Pt able to follow exercise prescription today without complaint.  Will continue to monitor for progression.   Dr. Emily Filbert is Medical Director for [place] and [facility].

## 2018-06-23 ENCOUNTER — Ambulatory Visit: Payer: 59

## 2018-06-24 DIAGNOSIS — I5022 Chronic systolic (congestive) heart failure: Secondary | ICD-10-CM

## 2018-06-24 DIAGNOSIS — I110 Hypertensive heart disease with heart failure: Secondary | ICD-10-CM | POA: Diagnosis not present

## 2018-06-24 NOTE — Progress Notes (Signed)
Daily Session Note  Patient Details  Name: Melanie Holloway MRN: [id number] Date of Birth: 1966-04-20 Referring Provider:     Cardiac Rehab from 05/19/2018 in [facility] Cardiac and Pulmonary Rehab  Referring Provider  Paraschos      Encounter Date: 06/24/2018  Check In:      Social History   Tobacco Use  Smoking Status Never Smoker  Smokeless Tobacco Never Used    Goals Met:  Independence with exercise equipment Exercise tolerated well No report of cardiac concerns or symptoms Strength training completed today  Goals Unmet:  Not Applicable  Comments: Pt able to follow exercise prescription today without complaint.  Will continue to monitor for progression.    Dr. Emily Filbert is Medical Director for [place] and [facility].

## 2018-06-27 ENCOUNTER — Ambulatory Visit
Admission: RE | Admit: 2018-06-27 | Discharge: 2018-06-27 | Disposition: A | Discharge status: Home / Self Care | Payer: 59 | Source: Ambulatory Visit | Attending: Oncology | Admitting: Oncology

## 2018-06-27 ENCOUNTER — Encounter: Payer: 59 | Admitting: *Deleted

## 2018-06-27 DIAGNOSIS — Z1231 Encounter for screening mammogram for malignant neoplasm of breast: Secondary | ICD-10-CM

## 2018-06-27 DIAGNOSIS — I110 Hypertensive heart disease with heart failure: Secondary | ICD-10-CM | POA: Diagnosis not present

## 2018-06-27 DIAGNOSIS — I5022 Chronic systolic (congestive) heart failure: Secondary | ICD-10-CM

## 2018-06-27 HISTORY — DX: Personal history of irradiation: Z92.3

## 2018-06-27 HISTORY — DX: Personal history of antineoplastic chemotherapy: Z92.21

## 2018-06-27 NOTE — Progress Notes (Signed)
Daily Session Note  Patient Details  Name: Melanie Holloway MRN: [id number] Date of Birth: 1965/12/20 Referring Provider:     Cardiac Rehab from 05/19/2018 in [facility] Cardiac and Pulmonary Rehab  Referring Provider  Paraschos      Encounter Date: 06/27/2018  Check In: Session Check In - 06/27/18 0842      Check-In   Supervising physician immediately available to respond to emergencies  See telemetry face sheet for immediately available ER MD    Location  [facility]-Cardiac & Pulmonary Rehab    Staff Present  Earlean Shawl, BS, ACSM CEP, Exercise Physiologist;Jessica Luan Pulling, MA, RCEP, CCRP, Exercise Physiologist;Susanne Bice, RN, BSN, CCRP    Medication changes reported      No    Fall or balance concerns reported     No    Tobacco Cessation  No Change    Warm-up and Cool-down  Performed on first and last piece of equipment    Resistance Training Performed  Yes    VAD Patient?  No    PAD/SET Patient?  No      Pain Assessment   Currently in Pain?  No/denies    Multiple Pain Sites  No          Social History   Tobacco Use  Smoking Status Never Smoker  Smokeless Tobacco Never Used    Goals Met:  Independence with exercise equipment Exercise tolerated well No report of cardiac concerns or symptoms Strength training completed today  Goals Unmet:  Not Applicable  Comments: Pt able to follow exercise prescription today without complaint.  Will continue to monitor for progression.    Dr. Emily Filbert is Medical Director for [place] and [facility].

## 2018-06-29 ENCOUNTER — Ambulatory Visit: Payer: 59 | Attending: Family | Admitting: Family

## 2018-06-29 ENCOUNTER — Encounter: Payer: Self-pay | Admitting: *Deleted

## 2018-06-29 ENCOUNTER — Encounter: Payer: Self-pay | Admitting: Family

## 2018-06-29 VITALS — BP 144/95 | HR 88 | Resp 18 | Ht 59.0 in | Wt 145.5 lb

## 2018-06-29 DIAGNOSIS — I5022 Chronic systolic (congestive) heart failure: Secondary | ICD-10-CM

## 2018-06-29 DIAGNOSIS — E669 Obesity, unspecified: Secondary | ICD-10-CM | POA: Diagnosis not present

## 2018-06-29 DIAGNOSIS — N189 Chronic kidney disease, unspecified: Secondary | ICD-10-CM | POA: Insufficient documentation

## 2018-06-29 DIAGNOSIS — E559 Vitamin D deficiency, unspecified: Secondary | ICD-10-CM | POA: Insufficient documentation

## 2018-06-29 DIAGNOSIS — I130 Hypertensive heart and chronic kidney disease with heart failure and stage 1 through stage 4 chronic kidney disease, or unspecified chronic kidney disease: Secondary | ICD-10-CM | POA: Diagnosis not present

## 2018-06-29 DIAGNOSIS — I890 Lymphedema, not elsewhere classified: Secondary | ICD-10-CM | POA: Insufficient documentation

## 2018-06-29 DIAGNOSIS — Z79899 Other long term (current) drug therapy: Secondary | ICD-10-CM | POA: Insufficient documentation

## 2018-06-29 DIAGNOSIS — D631 Anemia in chronic kidney disease: Secondary | ICD-10-CM | POA: Diagnosis not present

## 2018-06-29 DIAGNOSIS — I110 Hypertensive heart disease with heart failure: Secondary | ICD-10-CM | POA: Diagnosis not present

## 2018-06-29 DIAGNOSIS — M3214 Glomerular disease in systemic lupus erythematosus: Secondary | ICD-10-CM | POA: Diagnosis not present

## 2018-06-29 DIAGNOSIS — R0602 Shortness of breath: Secondary | ICD-10-CM | POA: Diagnosis present

## 2018-06-29 DIAGNOSIS — I10 Essential (primary) hypertension: Secondary | ICD-10-CM
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
## 2018-06-29 NOTE — Patient Instructions (Addendum)
Continue weighing daily and call for an overnight weight gain of > 2 pounds or a weekly weight gain of >5 pounds.   Low-Sodium Eating Plan Sodium, which is an element that makes up salt, helps you maintain a healthy balance of fluids in your body. Too much sodium can increase your blood pressure and cause fluid and waste to be held in your body. Your health care provider or dietitian may recommend following this plan if you have high blood pressure (hypertension), kidney disease, liver disease, or heart failure. Eating less sodium can help lower your blood pressure, reduce swelling, and protect your heart, liver, and kidneys. What are tips for following this plan? General guidelines  Most people on this plan should limit their sodium intake to 2,000 mg (milligrams) of sodium each day. Reading food labels  The Nutrition Facts label lists the amount of sodium in one serving of the food. If you eat more than one serving, you must multiply the listed amount of sodium by the number of servings.  Choose foods with less than 140 mg of sodium per serving.  Avoid foods with 300 mg of sodium or more per serving. Shopping  Look for lower-sodium products, often labeled as "low-sodium" or "no salt added."  Always check the sodium content even if foods are labeled as "unsalted" or "no salt added".  Buy fresh foods. ? Avoid canned foods and premade or frozen meals. ? Avoid canned, cured, or processed meats  Buy breads that have less than 80 mg of sodium per slice. Cooking  Eat more home-cooked food and less restaurant, buffet, and fast food.  Avoid adding salt when cooking. Use salt-free seasonings or herbs instead of table salt or sea salt. Check with your health care provider or pharmacist before using salt substitutes.  Cook with plant-based oils, such as canola, sunflower, or olive oil. Meal planning  When eating at a restaurant, ask that your food be prepared with less salt or no salt, if  possible.  Avoid foods that contain MSG (monosodium glutamate). MSG is sometimes added to Chinese food, bouillon, and some canned foods. What foods are recommended? The items listed may not be a complete list. Talk with your dietitian about what dietary choices are best for you. Grains Low-sodium cereals, including oats, puffed wheat and rice, and shredded wheat. Low-sodium crackers. Unsalted rice. Unsalted pasta. Low-sodium bread. Whole-grain breads and whole-grain pasta. Vegetables Fresh or frozen vegetables. "No salt added" canned vegetables. "No salt added" tomato sauce and paste. Low-sodium or reduced-sodium tomato and vegetable juice. Fruits Fresh, frozen, or canned fruit. Fruit juice. Meats and other protein foods Fresh or frozen (no salt added) meat, poultry, seafood, and fish. Low-sodium canned tuna and salmon. Unsalted nuts. Dried peas, beans, and lentils without added salt. Unsalted canned beans. Eggs. Unsalted nut butters. Dairy Milk. Soy milk. Cheese that is naturally low in sodium, such as ricotta cheese, fresh mozzarella, or Swiss cheese Low-sodium or reduced-sodium cheese. Cream cheese. Yogurt. Fats and oils Unsalted butter. Unsalted margarine with no trans fat. Vegetable oils such as canola or olive oils. Seasonings and other foods Fresh and dried herbs and spices. Salt-free seasonings. Low-sodium mustard and ketchup. Sodium-free salad dressing. Sodium-free light mayonnaise. Fresh or refrigerated horseradish. Lemon juice. Vinegar. Homemade, reduced-sodium, or low-sodium soups. Unsalted popcorn and pretzels. Low-salt or salt-free chips. What foods are not recommended? The items listed may not be a complete list. Talk with your dietitian about what dietary choices are best for you. Grains Instant hot cereals.   Bread stuffing, pancake, and biscuit mixes. Croutons. Seasoned rice or pasta mixes. Noodle soup cups. Boxed or frozen macaroni and cheese. Regular salted crackers.  Self-rising flour. Vegetables Sauerkraut, pickled vegetables, and relishes. Olives. French fries. Onion rings. Regular canned vegetables (not low-sodium or reduced-sodium). Regular canned tomato sauce and paste (not low-sodium or reduced-sodium). Regular tomato and vegetable juice (not low-sodium or reduced-sodium). Frozen vegetables in sauces. Meats and other protein foods Meat or fish that is salted, canned, smoked, spiced, or pickled. Bacon, ham, sausage, hotdogs, corned beef, chipped beef, packaged lunch meats, salt pork, jerky, pickled herring, anchovies, regular canned tuna, sardines, salted nuts. Dairy Processed cheese and cheese spreads. Cheese curds. Blue cheese. Feta cheese. String cheese. Regular cottage cheese. Buttermilk. Canned milk. Fats and oils Salted butter. Regular margarine. Ghee. Bacon fat. Seasonings and other foods Onion salt, garlic salt, seasoned salt, table salt, and sea salt. Canned and packaged gravies. Worcestershire sauce. Tartar sauce. Barbecue sauce. Teriyaki sauce. Soy sauce, including reduced-sodium. Steak sauce. Fish sauce. Oyster sauce. Cocktail sauce. Horseradish that you find on the shelf. Regular ketchup and mustard. Meat flavorings and tenderizers. Bouillon cubes. Hot sauce and Tabasco sauce. Premade or packaged marinades. Premade or packaged taco seasonings. Relishes. Regular salad dressings. Salsa. Potato and tortilla chips. Corn chips and puffs. Salted popcorn and pretzels. Canned or dried soups. Pizza. Frozen entrees and pot pies. Summary  Eating less sodium can help lower your blood pressure, reduce swelling, and protect your heart, liver, and kidneys.  Most people on this plan should limit their sodium intake to 1,500-2,000 mg (milligrams) of sodium each day.  Canned, boxed, and frozen foods are high in sodium. Restaurant foods, fast foods, and pizza are also very high in sodium. You also get sodium by adding salt to food.  Try to cook at home, eat  more fresh fruits and vegetables, and eat less fast food, canned, processed, or prepared foods. This information is not intended to replace advice given to you by your health care provider. Make sure you discuss any questions you have with your health care provider. Document Released: 03/27/2002 Document Revised: 09/28/2016 Document Reviewed: 09/28/2016 Else[employer] Interactive Patient Education  2018 [employer]  

## 2018-06-29 NOTE — Progress Notes (Signed)
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 completion of this program.  [facility] will follow with her.     Expected Outcomes  Short:  Melanie Holloway will exercise regularly to increase her stamina and improve her balance.   Long:  Melanie Holloway will exercise and develop coping strategies for her health and to manage stress.     Continue Psychosocial Services   Follow up required by staff       Psychosocial Re-Evaluation:   Psychosocial Discharge (Final Psychosocial Re-Evaluation):   Vocational Rehabilitation: Provide vocational rehab assistance to qualifying candidates.   Vocational Rehab Evaluation & Intervention: Vocational Rehab - 05/19/18 1431      Initial Vocational Rehab Evaluation & Intervention   Assessment shows need for Vocational Rehabilitation  No       Education: Education Goals: Education classes will be provided on a variety of topics geared toward better understanding of heart health and risk factor modification. Participant will state understanding/return demonstration of topics  presented as noted by education test scores.  Learning Barriers/Preferences: Learning Barriers/Preferences - 05/19/18 1428      Learning Barriers/Preferences   Learning Barriers  None    Learning Preferences  None       Education Topics:  AED/CPR: - Group verbal and written instruction with the use of models to demonstrate the basic use of the AED with the basic ABC's of resuscitation.   General Nutrition Guidelines/Fats and Fiber: -Group instruction provided by verbal, written material, models and posters to present the general guidelines for heart healthy nutrition. Gives an explanation and review of dietary fats and fiber.   Cardiac Rehab from 06/27/2018 in [facility] Cardiac and Pulmonary Rehab  Date  06/27/18  Educator  LB  Instruction Review Code  1- Verbalizes Understanding      Controlling Sodium/Reading Food Labels: -Group verbal and written material supporting the discussion of sodium use in heart healthy nutrition. Review and explanation with models, verbal and written materials for utilization of the food label.   Exercise Physiology & General Exercise Guidelines: - Group verbal and written instruction with models to review the exercise physiology of the cardiovascular system and associated critical values. Provides general exercise guidelines with specific guidelines to those with heart or lung disease.    Aerobic Exercise & Resistance Training: - Gives group verbal and written instruction on the various components of exercise. Focuses on aerobic and resistive training programs and the benefits of this training and how to safely progress through these programs..   Cardiac Rehab from 06/27/2018 in [facility] Cardiac and Pulmonary Rehab  Date  05/23/18  Educator  [facility]  Instruction Review Code  1- Verbalizes Understanding      Flexibility, Balance, Mind/Body Relaxation: Provides group verbal/written instruction on the benefits of flexibility and balance training, including  mind/body exercise modes such as yoga, pilates and tai chi.  Demonstration and  skill practice provided.   Cardiac Rehab from 06/27/2018 in [facility] Cardiac and Pulmonary Rehab  Date  05/30/18  Educator  [facility]  Instruction Review Code  1- Verbalizes Understanding      Stress and Anxiety: - Provides group verbal and written instruction about the health risks of elevated stress and causes of high stress.  Discuss the correlation between heart/lung disease and anxiety and treatment options. Review healthy ways to manage with stress and anxiety.   Cardiac Rehab from 06/27/2018 in [facility] Cardiac and Pulmonary Rehab  Date  06/08/18  Educator  [facility]  Instruction Review Code  1- Verbalizes Understanding      Depression: - Provides group verbal and written instruction on the correlation between heart/lung disease and depressed Holloway, treatment options, and the stigmas associated with seeking treatment.   Cardiac Rehab from 06/27/2018 in [facility] Cardiac and Pulmonary Rehab  Date  05/25/18  Educator  [facility]  Instruction Review Code  1- Verbalizes Understanding      Anatomy & Physiology of the Heart: - Group verbal and written instruction and models provide basic cardiac anatomy and physiology, with the coronary electrical and arterial systems. Review of Valvular disease and Heart Failure   Cardiac Rehab from 06/27/2018 in [facility] Cardiac and Pulmonary Rehab  Date  06/06/18  Educator  CE  Instruction Review Code  1- Verbalizes Understanding      Cardiac Procedures: - Group verbal and written instruction to review commonly prescribed medications for heart disease. Reviews the medication, class of the drug, and side effects. Includes the steps to properly store meds and maintain the prescription regimen. (beta blockers and nitrates)   Cardiac Medications I: - Group verbal and written instruction to review commonly prescribed medications for heart disease. Reviews the medication, class of the drug, and side effects.  Includes the steps to properly store meds and maintain the prescription regimen.   Cardiac Medications II: -Group verbal and written instruction to review commonly prescribed medications for heart disease. Reviews the medication, class of the drug, and side effects. (all other drug classes)   Cardiac Rehab from 06/27/2018 in [facility] Cardiac and Pulmonary Rehab  Date  06/01/18  Educator  [place]  Instruction Review Code  1- Verbalizes Understanding       Go Sex-Intimacy & Heart Disease, Get SMART - Goal Setting: - Group verbal and written instruction through game format to discuss heart disease and the return to sexual intimacy. Provides group verbal and written material to discuss and apply goal setting through the application of the S.M.A.R.T. Method.   Other Matters of the Heart: - Provides group verbal, written materials and models to describe Stable Angina and Peripheral Artery. Includes description of the disease process and treatment options available to the cardiac patient.   Cardiac Rehab from 06/27/2018 in [facility] Cardiac and Pulmonary Rehab  Date  06/06/18  Educator  CE  Instruction Review Code  1- Verbalizes Understanding      Exercise & Equipment Safety: - Individual verbal instruction and demonstration of equipment use and safety with use of the equipment.   Cardiac Rehab from 06/27/2018 in [facility] Cardiac and Pulmonary Rehab  Date  05/19/18  Educator  [facility]  Instruction Review Code  1- Verbalizes Understanding      Infection Prevention: - Provides verbal and written material to individual with discussion of infection control including proper hand washing and proper equipment cleaning during exercise session.   Cardiac Rehab from 06/27/2018 in [facility] Cardiac and Pulmonary Rehab  Date  05/19/18  Educator  [facility]  Instruction Review Code  1- Verbalizes Understanding      Falls Prevention: - Provides verbal and written material to individual with discussion of falls prevention and safety.    Cardiac Rehab from 06/27/2018 in [facility] Cardiac and Pulmonary Rehab  Date  05/19/18  Educator  [facility]  Instruction Review Code  1- Verbalizes Understanding      Diabetes: - Individual verbal and written instruction to review signs/symptoms of diabetes, desired ranges of glucose level fasting, after meals and with exercise. Acknowledge that pre and post exercise glucose checks will be done for 3 sessions at entry of program.   Know Your Numbers and Risk Factors: -Group verbal and written instruction about important numbers in your health.  Discussion of what are risk factors and how they play a role in the disease process.  Review of Cholesterol, Blood Pressure, Diabetes, and BMI and the role they play in your overall health.   Cardiac Rehab from 06/27/2018 in [facility] Cardiac and Pulmonary Rehab  Date  06/01/18  Educator  [place]  Instruction Review Code  1- Verbalizes Understanding      Sleep Hygiene: -Provides group verbal and written instruction about how sleep can affect your health.  Define sleep hygiene, discuss sleep cycles and impact of sleep habits. Review good sleep hygiene tips.    Cardiac Rehab from 06/27/2018 in [facility] Cardiac and Pulmonary Rehab  Date  06/22/18  Educator  [facility]  Instruction Review Code  1- Verbalizes Understanding      Other: -Provides group and verbal instruction on various topics (see comments)   Knowledge Questionnaire Score: Knowledge Questionnaire Score - 05/19/18 1429      Knowledge Questionnaire Score   Pre Score  22/26   correct answers reviewed with Melanie Holloway,. focus on Stress      Core Components/Risk Factors/Patient Goals at Admission: Personal Goals and Risk Factors at Admission - 05/19/18 1422      Core Components/Risk Factors/Patient Goals on Admission    Weight Management  Yes;Weight Maintenance    Intervention  Weight Management: Provide education and appropriate resources to help participant work on and attain dietary goals.;Weight Management: Develop  a combined nutrition and exercise program designed to reach desired caloric intake, while maintaining appropriate intake of nutrient and fiber, sodium and fats, and appropriate energy expenditure required for the weight goal.    Admit Weight  141 lb (64 kg)    Expected Outcomes  Short Term: Continue to assess and modify interventions until short term weight is achieved;Long Term: Adherence to nutrition and physical activity/exercise program aimed toward attainment of established weight goal;Weight Maintenance: Understanding of the daily nutrition guidelines, which includes 25-35% calories from fat, 7% or less cal from saturated fats, less than 224m cholesterol, less than 1.5gm of sodium, & 5 or more servings of fruits and vegetables daily;Understanding recommendations for meals to include 15-35% energy as protein, 25-35% energy from fat, 35-60% energy from carbohydrates, less than 2029mof dietary cholesterol, 20-35 gm of total fiber daily;Understanding of distribution of calorie intake throughout the day with the consumption of 4-5 meals/snacks    Hypertension  Yes    Intervention  Provide education on lifestyle modifcations including regular physical activity/exercise, weight management, moderate sodium restriction and increased consumption of fresh fruit, vegetables, and low fat dairy, alcohol moderation, and smoking cessation.;Monitor prescription use compliance.    Expected Outcomes  Short Term: Continued assessment and intervention until BP is < 140/9063mG in hypertensive participants. < 130/34m18m in hypertensive  participants with diabetes, heart failure or chronic kidney disease.;Long Term: Maintenance of blood pressure at goal levels.       Core Components/Risk Factors/Patient Goals Review:  Goals and Risk Factor Review    Row Name 06/10/18 [phone]             Core Components/Risk Factors/Patient Goals Review   Personal Goals Review  Weight Management/Obesity;Lipids;Hypertension        Review  Melanie Holloway is taking meds as directed.  She checks BP at home and it runs 120s/70s.  She says she feels more stable when walking since starting HT. The muscle soreness from exercise is getting better compared to first sessions. Melanie Holloway met with RD and has started changing some things she eats such as brown rice instead of white and less sodium.        Expected Outcomes  Short - continue to attend and make gradual dietary changes Long - maintain helthy exercise and dietary habits          Core Components/Risk Factors/Patient Goals at Discharge (Final Review):  Goals and Risk Factor Review - 06/10/18 0811      Core Components/Risk Factors/Patient Goals Review   Personal Goals Review  Weight Management/Obesity;Lipids;Hypertension    Review  Melanie Holloway is taking meds as directed.  She checks BP at home and it runs 120s/70s.  She says she feels more stable when walking since starting HT. The muscle soreness from exercise is getting better compared to first sessions. Melanie Holloway met with RD and has started changing some things she eats such as brown rice instead of white and less sodium.     Expected Outcomes  Short - continue to attend and make gradual dietary changes Long - maintain helthy exercise and dietary habits       ITP Comments: ITP Comments    Row Name 05/19/18 1421 06/01/18 0755 06/29/18 0540       ITP Comments  Med Review completed. Initial ITP created. Diagnosis can be found in [facility] 5/16  30 day review completed. ITP sent to Dr. Ramonita Lab, covering for Dr. Emily Filbert, Medical Director of Cardiac Rehab. Continue with ITP unless changes are made by physician.  New to program  30 day review completed. ITP sent to Dr. Emily Filbert, Medical Director of Cardiac Rehab. Continue with ITP unless changes are made by physician        Comments:

## 2018-06-29 NOTE — Progress Notes (Signed)
Daily Session Note  Patient Details  Name: Melanie Holloway MRN: [id number] Date of Birth: 1966/09/16 Referring Provider:     Cardiac Rehab from 05/19/2018 in [facility] Cardiac and Pulmonary Rehab  Referring Provider  Paraschos      Encounter Date: 06/29/2018  Check In: Session Check In - 06/29/18 0738      Check-In   Supervising physician immediately available to respond to emergencies  See telemetry face sheet for immediately available ER MD    Location  [facility]-Cardiac & Pulmonary Rehab    Staff Present  Heath Lark, RN, BSN, CCRP;Clarrissa Shimkus Darrin Nipper, [place], [place], Sarah Ann, Exercise Physiologist    Medication changes reported      No    Fall or balance concerns reported     No    Warm-up and Cool-down  Performed on first and last piece of equipment    Resistance Training Performed  Yes    VAD Patient?  No      Pain Assessment   Currently in Pain?  No/denies          Social History   Tobacco Use  Smoking Status Never Smoker  Smokeless Tobacco Never Used    Goals Met:  Independence with exercise equipment Exercise tolerated well No report of cardiac concerns or symptoms Strength training completed today  Goals Unmet:  Not Applicable  Comments: Pt able to follow exercise prescription today without complaint.  Will continue to monitor for progression.   Dr. Emily Filbert is Medical Director for [place] and [facility].

## 2018-06-30 ENCOUNTER — Encounter: Payer: Self-pay | Admitting: Family

## 2018-07-01 ENCOUNTER — Encounter: Payer: 59 | Admitting: *Deleted

## 2018-07-01 DIAGNOSIS — R809 Proteinuria, unspecified: Secondary | ICD-10-CM | POA: Diagnosis not present

## 2018-07-01 DIAGNOSIS — N183 Chronic kidney disease, stage 3 (moderate): Secondary | ICD-10-CM | POA: Diagnosis not present

## 2018-07-01 DIAGNOSIS — M3214 Glomerular disease in systemic lupus erythematosus: Secondary | ICD-10-CM | POA: Diagnosis not present

## 2018-07-01 DIAGNOSIS — I110 Hypertensive heart disease with heart failure: Secondary | ICD-10-CM | POA: Diagnosis not present

## 2018-07-01 DIAGNOSIS — I5022 Chronic systolic (congestive) heart failure: Secondary | ICD-10-CM

## 2018-07-01 DIAGNOSIS — I129 Hypertensive chronic kidney disease with stage 1 through stage 4 chronic kidney disease, or unspecified chronic kidney disease: Secondary | ICD-10-CM | POA: Diagnosis not present

## 2018-07-01 NOTE — Progress Notes (Signed)
Daily Session Note  Patient Details  Name: Melanie Holloway MRN: [id number] Date of Birth: Feb 01, 1966 Referring Provider:     Cardiac Rehab from 05/19/2018 in [facility] Cardiac and Pulmonary Rehab  Referring Provider  Paraschos      Encounter Date: 07/01/2018  Check In: Session Check In - 07/01/18 0800      Check-In   Supervising physician immediately available to respond to emergencies  See telemetry face sheet for immediately available ER MD    Location  [facility]-Cardiac & Pulmonary Rehab    Staff Present  Renita Papa, RN BSN;Sandralee Tarkington Luan Pulling, MA, RCEP, CCRP, Exercise Physiologist;Amanda Oletta Darter, [place], ACSM CEP, Exercise Physiologist    Medication changes reported      No    Fall or balance concerns reported     No    Warm-up and Cool-down  Performed on first and last piece of equipment    Resistance Training Performed  Yes    VAD Patient?  No    PAD/SET Patient?  No      Pain Assessment   Currently in Pain?  No/denies          Social History   Tobacco Use  Smoking Status Never Smoker  Smokeless Tobacco Never Used    Goals Met:  Independence with exercise equipment Exercise tolerated well No report of cardiac concerns or symptoms Strength training completed today  Goals Unmet:  Not Applicable  Comments: Pt able to follow exercise prescription today without complaint.  Will continue to monitor for progression.    Dr. Emily Filbert is Medical Director for [place] and [facility].

## 2018-07-04 ENCOUNTER — Encounter: Payer: 59 | Admitting: *Deleted

## 2018-07-04 DIAGNOSIS — M3214 Glomerular disease in systemic lupus erythematosus: Secondary | ICD-10-CM | POA: Diagnosis not present

## 2018-07-04 DIAGNOSIS — M329 Systemic lupus erythematosus, unspecified: Secondary | ICD-10-CM | POA: Diagnosis not present

## 2018-07-04 DIAGNOSIS — I110 Hypertensive heart disease with heart failure: Secondary | ICD-10-CM | POA: Diagnosis not present

## 2018-07-04 DIAGNOSIS — I5032 Chronic diastolic (congestive) heart failure: Secondary | ICD-10-CM | POA: Diagnosis not present

## 2018-07-04 DIAGNOSIS — I5022 Chronic systolic (congestive) heart failure: Secondary | ICD-10-CM

## 2018-07-04 NOTE — Progress Notes (Signed)
Daily Session Note  Patient Details  Name: Melanie Holloway MRN: [id number] Date of Birth: May 30, 1966 Referring Provider:     Cardiac Rehab from 05/19/2018 in [facility] Cardiac and Pulmonary Rehab  Referring Provider  Paraschos      Encounter Date: 07/04/2018  Check In: Session Check In - 07/04/18 0746      Check-In   Supervising physician immediately available to respond to emergencies  See telemetry face sheet for immediately available ER MD    Location  [facility]-Cardiac & Pulmonary Rehab    Staff Present  Heath Lark, RN, BSN, Laveda Norman, BS, ACSM CEP, Exercise Physiologist;Arlette Schaad [place], [place], RCEP, CCRP, Exercise Physiologist    Medication changes reported      No    Fall or balance concerns reported     No    Warm-up and Cool-down  Performed on first and last piece of equipment    Resistance Training Performed  Yes    VAD Patient?  No    PAD/SET Patient?  No      Pain Assessment   Currently in Pain?  No/denies          Social History   Tobacco Use  Smoking Status Never Smoker  Smokeless Tobacco Never Used    Goals Met:  Independence with exercise equipment Exercise tolerated well No report of cardiac concerns or symptoms Strength training completed today  Goals Unmet:  Not Applicable  Comments: Pt able to follow exercise prescription today without complaint.  Will continue to monitor for progression.    Dr. Emily Filbert is Medical Director for [place] and [facility].

## 2018-07-06 DIAGNOSIS — I5022 Chronic systolic (congestive) heart failure: Secondary | ICD-10-CM

## 2018-07-06 DIAGNOSIS — I110 Hypertensive heart disease with heart failure: Secondary | ICD-10-CM | POA: Diagnosis not present

## 2018-07-06 NOTE — Progress Notes (Signed)
Daily Session Note  Patient Details  Name: Melanie Holloway MRN: [id number] Date of Birth: 09/03/1966 Referring Provider:     Cardiac Rehab from 05/19/2018 in [facility] Cardiac and Pulmonary Rehab  Referring Provider  Paraschos      Encounter Date: 07/06/2018  Check In: Session Check In - 07/06/18 0801      Check-In   Supervising physician immediately available to respond to emergencies  See telemetry face sheet for immediately available ER MD    Location  [facility]-Cardiac & Pulmonary Rehab    Staff Present  Alberteen Sam, MA, RCEP, CCRP, Exercise Physiologist;Kenyanna Grzesiak RCP,RRT,BSRT;Carroll Enterkin, Therapist, sports, BSN    Medication changes reported      No    Fall or balance concerns reported     No    Warm-up and Cool-down  Performed on first and last piece of equipment    Resistance Training Performed  Yes    VAD Patient?  No      Pain Assessment   Currently in Pain?  No/denies          Social History   Tobacco Use  Smoking Status Never Smoker  Smokeless Tobacco Never Used    Goals Met:  Independence with exercise equipment Exercise tolerated well Personal goals reviewed No report of cardiac concerns or symptoms Strength training completed today  Goals Unmet:  Not Applicable  Comments: Pt able to follow exercise prescription today without complaint.  Will continue to monitor for progression.   Dr. Emily Filbert is Medical Director for [place] and [facility].

## 2018-07-08 ENCOUNTER — Encounter: Payer: 59 | Admitting: *Deleted

## 2018-07-08 ENCOUNTER — Telehealth: Payer: Self-pay | Admitting: *Deleted

## 2018-07-08 VITALS — Ht <= 58 in | Wt 145.0 lb

## 2018-07-08 DIAGNOSIS — I5022 Chronic systolic (congestive) heart failure: Secondary | ICD-10-CM

## 2018-07-08 DIAGNOSIS — I110 Hypertensive heart disease with heart failure: Secondary | ICD-10-CM | POA: Diagnosis not present

## 2018-07-08 NOTE — Progress Notes (Signed)
Daily Session Note  Patient Details  Name: Melanie Holloway MRN: [id number] Date of Birth: 07/09/1966 Referring Provider:     Cardiac Rehab from 05/19/2018 in [facility] Cardiac and Pulmonary Rehab  Referring Provider  Paraschos      Encounter Date: 07/08/2018  Check In: Session Check In - 07/08/18 0744      Check-In   Supervising physician immediately available to respond to emergencies  See telemetry face sheet for immediately available ER MD    Location  [facility]-Cardiac & Pulmonary Rehab    Staff Present  Alberteen Sam, MA, RCEP, CCRP, Exercise Physiologist;Amanda Oletta Darter, BA, ACSM CEP, Exercise Physiologist;Meredith Sherryll Burger, RN BSN    Medication changes reported      No    Fall or balance concerns reported     No    Warm-up and Cool-down  Performed on first and last piece of equipment    Resistance Training Performed  Yes    VAD Patient?  No    PAD/SET Patient?  No      Pain Assessment   Currently in Pain?  No/denies          Social History   Tobacco Use  Smoking Status Never Smoker  Smokeless Tobacco Never Used    Goals Met:  Independence with exercise equipment Exercise tolerated well No report of cardiac concerns or symptoms Strength training completed today  Goals Unmet:  Not Applicable  Comments: Pt able to follow exercise prescription today without complaint.  Will continue to monitor for progression. Melanie Holloway Name 05/19/18 1439 07/08/18 0824       6 Minute Walk   Phase  Initial  Discharge    Distance  600 feet  1014 feet    Distance % Change  -  69 %    Distance Feet Change  -  414 ft    Walk Time  6 minutes  6 minutes    # of Rest Breaks  0  0    MPH  1.13  1.92    METS  2.2  2.9    RPE  12  12    Perceived Dyspnea   2  0    VO2 Peak  7.73  10.27    Symptoms  No  No    Resting HR  78 bpm  81 bpm    Resting BP  102/66  126/64    Resting Oxygen Saturation   98 %  -    Exercise Oxygen Saturation  during 6 min walk  99 %  -    Max Ex. HR   100 bpm  97 bpm    Max Ex. BP  114/60  124/68    2 Minute Post BP  108/64  -          Dr. Emily Filbert is Medical Director for Melanie Holloway and [facility].

## 2018-07-08 NOTE — Telephone Encounter (Signed)
Copied from [place] [id number]. Topic: Referral - Request >> Jul 08, 2018 11:50 AM Hewitt Shorts wrote: Pt is requesting that Dr. Darnell Level does a referral for pulmonary -she states that she just finished cardiac rehab  Best number is  [phone]

## 2018-07-09 NOTE — Telephone Encounter (Signed)
Not sure she needs lung doctor evaluation - what specific concerns does she have? I don't think we've talked about seeing lung doctor before, nor has heart doctor? Her dyspnea come from her systolic heart failure.

## 2018-07-11 ENCOUNTER — Encounter: Payer: 59 | Admitting: *Deleted

## 2018-07-11 ENCOUNTER — Encounter: Payer: Self-pay | Admitting: Dietician

## 2018-07-11 DIAGNOSIS — I110 Hypertensive heart disease with heart failure: Secondary | ICD-10-CM | POA: Diagnosis not present

## 2018-07-11 DIAGNOSIS — I5022 Chronic systolic (congestive) heart failure: Secondary | ICD-10-CM

## 2018-07-11 NOTE — Progress Notes (Signed)
Daily Session Note  Patient Details  Name: Melanie Holloway MRN: [id number] Date of Birth: [birth year] Referring Provider:     Cardiac Rehab from 05/19/2018 in [facility] Cardiac and Pulmonary Rehab  Referring Provider  Paraschos      Encounter Date: 07/11/2018  Check In: Session Check In - 07/11/18 0757      Check-In   Supervising physician immediately available to respond to emergencies  See telemetry face sheet for immediately available ER MD    Location  [facility]-Cardiac & Pulmonary Rehab    Staff Present  Alberteen Sam, MA, RCEP, CCRP, Exercise Physiologist;Herson Prichard Amedeo Plenty, BS, ACSM CEP, Exercise Physiologist;Susanne Bice, RN, BSN, CCRP    Medication changes reported      No    Fall or balance concerns reported     No    Tobacco Cessation  No Change    Warm-up and Cool-down  Performed on first and last piece of equipment    Resistance Training Performed  Yes    VAD Patient?  No    PAD/SET Patient?  No      Pain Assessment   Currently in Pain?  No/denies    Multiple Pain Sites  No          Social History   Tobacco Use  Smoking Status Never Smoker  Smokeless Tobacco Never Used    Goals Met:  Independence with exercise equipment Exercise tolerated well No report of cardiac concerns or symptoms Strength training completed today  Goals Unmet:  Not Applicable  Comments: Pt able to follow exercise prescription today without complaint.  Will continue to monitor for progression.    Dr. Emily Filbert is Medical Director for [facility] and [facility].

## 2018-07-11 NOTE — Telephone Encounter (Signed)
Spoke with pt asking reason for pulmonary referral request.  States she graduates from heart failure clinic Wed, 07/13/18 and they were suggesting she could maybe could see pulmonary for dyspnea. But for her to talk with her doctor.   Gives permission to lvm.

## 2018-07-13 DIAGNOSIS — I110 Hypertensive heart disease with heart failure: Secondary | ICD-10-CM | POA: Diagnosis not present

## 2018-07-13 DIAGNOSIS — R319 Hematuria, unspecified: Secondary | ICD-10-CM | POA: Diagnosis not present

## 2018-07-13 DIAGNOSIS — R768 Other specified abnormal immunological findings in serum: Secondary | ICD-10-CM | POA: Diagnosis not present

## 2018-07-13 DIAGNOSIS — R809 Proteinuria, unspecified: Secondary | ICD-10-CM | POA: Diagnosis not present

## 2018-07-13 DIAGNOSIS — I5022 Chronic systolic (congestive) heart failure: Secondary | ICD-10-CM

## 2018-07-13 NOTE — Progress Notes (Signed)
Daily Session Note  Patient Details  Name: Melanie Holloway MRN: [id number] Date of Birth: 01/29/1966 Referring Provider:     Cardiac Rehab from 05/19/2018 in [facility]  Referring Provider  Paraschos      Encounter Date: 07/13/2018  Check In: Session Check In - 07/13/18 0818      Check-In   Supervising physician immediately available to respond to emergencies  See telemetry face sheet for immediately available ER MD    Location  [facility]    Staff Present  Jessica Hawkins, MA, RCEP, CCRP, Exercise Physiologist;Joseph Hood RCP,RRT,BSRT;Susanne Bice, RN, BSN, CCRP    Medication changes reported      No    Fall or balance concerns reported     No    Warm-up and Cool-down  Performed on first and last piece of equipment    Resistance Training Performed  Yes    VAD Patient?  No      Pain Assessment   Currently in Pain?  No/denies          Social History   Tobacco Use  Smoking Status Never Smoker  Smokeless Tobacco Never Used    Goals Met:  Proper associated with RPD/PD & O2 Sat Independence with exercise equipment Exercise tolerated well No report of cardiac concerns or symptoms Strength training completed today  Goals Unmet:  Not Applicable  Comments:  Melanie Holloway graduated today from  rehab with 36 sessions completed.  Details of the patient's exercise prescription and what She needs to do in order to continue the prescription and progress were discussed with patient.  Patient was given a copy of prescription and goals.  Patient verbalized understanding.  Melanie Holloway plans to continue to exercise by using her stair stepper at home and walking.    Dr. Mark Miller is Medical Director for [facility] and [facility]. 

## 2018-07-13 NOTE — Progress Notes (Signed)
Discharge Progress Report  Patient Details  Name: Melanie Holloway MRN: [id number] Date of Birth: 05-27-1966 Referring Provider:     Cardiac Rehab from 05/19/2018 in [facility]  Referring Provider  Paraschos       Number of Visits: 36/36  Reason for Discharge:  Patient reached a stable level of exercise. Patient independent in their exercise. Patient has met program and personal goals.  Smoking History:  Social History   Tobacco Use  Smoking Status Never Smoker  Smokeless Tobacco Never Used    Diagnosis:  Heart failure, chronic systolic (HCC)  ADL UCSD:   Initial Exercise Prescription: Initial Exercise Prescription - 05/19/18 1400      Date of Initial Exercise RX and Referring Provider   Date  05/19/18    Referring Provider  Paraschos      Treadmill   MPH  1    Grade  0    Minutes  15    METs  1.77      NuStep   Level  1    SPM  80    Minutes  15    METs  2      Biostep-RELP   Level  1    SPM  50    Minutes  15    METs  2      Track   Laps  15    Minutes  15    METs  1.7      Prescription Details   Frequency (times per week)  3    Duration  Progress to 45 minutes of aerobic exercise without signs/symptoms of physical distress      Intensity   THRR 40-80% of Max Heartrate  113-149    Ratings of Perceived Exertion  11-13    Perceived Dyspnea  0-4      Resistance Training   Training Prescription  Yes    Weight  2 lb    Reps  10-15       Discharge Exercise Prescription (Final Exercise Prescription Changes): Exercise Prescription Changes - 07/04/18 1500      Response to Exercise   Blood Pressure (Admit)  126/64    Blood Pressure (Exercise)  142/80    Blood Pressure (Exit)  122/80    Heart Rate (Admit)  96 bpm    Heart Rate (Exercise)  134 bpm    Heart Rate (Exit)  83 bpm    Rating of Perceived Exertion (Exercise)  13    Symptoms  none    Comments  BioStep is very hard for her    Duration  Continue with 45 min of  aerobic exercise without signs/symptoms of physical distress.    Intensity  THRR unchanged      Progression   Progression  Continue to progress workloads to maintain intensity without signs/symptoms of physical distress.    Average METs  1.72      Resistance Training   Training Prescription  Yes    Weight  3 lbs    Reps  10-15      Interval Training   Interval Training  No      Treadmill   MPH  1.5    Grade  1    Minutes  15    METs  2.35      NuStep   Level  4    Minutes  15    METs  1.8      Biostep-RELP   Level  1  Minutes  15    METs  1      Home Exercise Plan   Plans to continue exercise at  Home (comment)   walking, stair climber   Frequency  Add 2 additional days to program exercise sessions.    Initial Home Exercises Provided  06/03/18       Functional Capacity: 6 Minute Walk    Row Name 05/19/18 1439 07/08/18 0824       6 Minute Walk   Phase  Initial  Discharge    Distance  600 feet  1014 feet    Distance % Change  -  69 %    Distance Feet Change  -  414 ft    Walk Time  6 minutes  6 minutes    # of Rest Breaks  0  0    MPH  1.13  1.92    METS  2.2  2.9    RPE  12  12    Perceived Dyspnea   2  0    VO2 Peak  7.73  10.27    Symptoms  No  No    Resting HR  78 bpm  81 bpm    Resting BP  102/66  126/64    Resting Oxygen Saturation   98 %  -    Exercise Oxygen Saturation  during 6 min walk  99 %  -    Max Ex. HR  100 bpm  97 bpm    Max Ex. BP  114/60  124/68    2 Minute Post BP  108/64  -       Psychological, QOL, Others - Outcomes: PHQ 2/9: Depression screen [facility] 2/9 07/11/2018 06/29/2018 06/16/2018 05/19/2018 03/10/2018  Decreased Interest 0 0 0 0 0  Down, Depressed, Hopeless 0 0 0 0 0  PHQ - 2 Score 0 0 0 0 0  Altered sleeping 0 - - 0 -  Tired, decreased energy 1 - - 1 -  Change in appetite 0 - - 0 -  Feeling bad or failure about yourself  0 - - 1 -  Trouble concentrating 0 - - 0 -  Moving slowly or fidgety/restless 0 - - 0 -  Suicidal  thoughts 0 - - 0 -  PHQ-9 Score 1 - - 2 -  Difficult doing work/chores Not difficult at all - - Not difficult at all -  Some recent data might be hidden    Quality of Life: Quality of Life - 07/11/18 0758      Quality of Life   Select  Quality of Life      Quality of Life Scores   Health/Function Pre  24.6 %    Health/Function Post  28.03 %    Health/Function % Change  13.94 %    Socioeconomic Pre  27.1 %    Socioeconomic Post  28.93 %    Socioeconomic % Change   6.75 %    Psych/Spiritual Pre  25.71 %    Psych/Spiritual Post  29.14 %    Psych/Spiritual % Change  13.34 %    Family Pre  27.6 %    Family Post  30 %    Family % Change  8.7 %    GLOBAL Pre  25.91 %    GLOBAL Post  28.74 %    GLOBAL % Change  10.92 %       Personal Goals: Goals established at orientation with interventions provided to work toward goal.  Personal Goals and Risk Factors at Admission - 05/19/18 1422      Core Components/Risk Factors/Patient Goals on Admission    Weight Management  Yes;Weight Maintenance    Intervention  Weight Management: Provide education and appropriate resources to help participant work on and attain dietary goals.;Weight Management: Develop a combined nutrition and exercise program designed to reach desired caloric intake, while maintaining appropriate intake of nutrient and fiber, sodium and fats, and appropriate energy expenditure required for the weight goal.    Admit Weight  141 lb (64 kg)    Expected Outcomes  Short Term: Continue to assess and modify interventions until short term weight is achieved;Long Term: Adherence to nutrition and physical activity/exercise program aimed toward attainment of established weight goal;Weight Maintenance: Understanding of the daily nutrition guidelines, which includes 25-35% calories from fat, 7% or less cal from saturated fats, less than 274m cholesterol, less than 1.5gm of sodium, & 5 or more servings of fruits and vegetables  daily;Understanding recommendations for meals to include 15-35% energy as protein, 25-35% energy from fat, 35-60% energy from carbohydrates, less than 2039mof dietary cholesterol, 20-35 gm of total fiber daily;Understanding of distribution of calorie intake throughout the day with the consumption of 4-5 meals/snacks    Hypertension  Yes    Intervention  Provide education on lifestyle modifcations including regular physical activity/exercise, weight management, moderate sodium restriction and increased consumption of fresh fruit, vegetables, and low fat dairy, alcohol moderation, and smoking cessation.;Monitor prescription use compliance.    Expected Outcomes  Short Term: Continued assessment and intervention until BP is < 140/9025mG in hypertensive participants. < 130/24m38m in hypertensive participants with diabetes, heart failure or chronic kidney disease.;Long Term: Maintenance of blood pressure at goal levels.        Personal Goals Discharge: Goals and Risk Factor Review    Row Name 06/10/18 [id number] 0820           Core Components/Risk Factors/Patient Goals Review   Personal Goals Review  Weight Management/Obesity;Lipids;Hypertension  Weight Management/Obesity;Lipids;Hypertension      Review  CherCharlitataking meds as directed.  She checks BP at home and it runs 120s/70s.  She says she feels more stable when walking since starting HT. The muscle soreness from exercise is getting better compared to first sessions. CherMalachy Mood with RD and has started changing some things she eats such as brown rice instead of white and less sodium.   They have added a new BP med for CherIa she had not taken in her record from home.  She does check it at home regularly, so she will take in her record for her next appointment.  He weight is starting to come down, she was 144 lbs today. She would like to continue to work on weight.  Overall she is doing well with her medications.   She has an appointment  with her pulmonologist next Wednesday about joining Pulmonary Rehab or CARE after Cardiac is over.       Expected Outcomes  Short - continue to attend and make gradual dietary changes Long - maintain helthy exercise and dietary habits  Short: Talk to doctor about Pulmonary vs CARE  Long: Continue to work on weight loss.          Exercise Goals and Review: Exercise Goals    Row Name 05/19/18 1438             Exercise Goals   Increase Physical Activity  Yes  Intervention  Provide advice, education, support and counseling about physical activity/exercise needs.;Develop an individualized exercise prescription for aerobic and resistive training based on initial evaluation findings, risk stratification, comorbidities and participant's personal goals.       Expected Outcomes  Short Term: Attend rehab on a regular basis to increase amount of physical activity.;Long Term: Add in home exercise to make exercise part of routine and to increase amount of physical activity.;Long Term: Exercising regularly at least 3-5 days a week.       Increase Strength and Stamina  Yes       Intervention  Provide advice, education, support and counseling about physical activity/exercise needs.;Develop an individualized exercise prescription for aerobic and resistive training based on initial evaluation findings, risk stratification, comorbidities and participant's personal goals.       Expected Outcomes  Short Term: Increase workloads from initial exercise prescription for resistance, speed, and METs.;Short Term: Perform resistance training exercises routinely during rehab and add in resistance training at home;Long Term: Improve cardiorespiratory fitness, muscular endurance and strength as measured by increased METs and functional capacity (6MWT)       Able to understand and use rate of perceived exertion (RPE) scale  Yes       Intervention  Provide education and explanation on how to use RPE scale       Expected  Outcomes  Short Term: Able to use RPE daily in rehab to express subjective intensity level;Long Term:  Able to use RPE to guide intensity level when exercising independently       Able to understand and use Dyspnea scale  Yes       Intervention  Provide education and explanation on how to use Dyspnea scale       Expected Outcomes  Short Term: Able to use Dyspnea scale daily in rehab to express subjective sense of shortness of breath during exertion;Long Term: Able to use Dyspnea scale to guide intensity level when exercising independently       Knowledge and understanding of Target Heart Rate Range (THRR)  Yes       Intervention  Provide education and explanation of THRR including how the numbers were predicted and where they are located for reference       Expected Outcomes  Short Term: Able to state/look up THRR;Short Term: Able to use daily as guideline for intensity in rehab;Long Term: Able to use THRR to govern intensity when exercising independently       Able to check pulse independently  Yes       Intervention  Provide education and demonstration on how to check pulse in carotid and radial arteries.;Review the importance of being able to check your own pulse for safety during independent exercise       Expected Outcomes  Short Term: Able to explain why pulse checking is important during independent exercise;Long Term: Able to check pulse independently and accurately       Understanding of Exercise Prescription  Yes       Intervention  Provide education, explanation, and written materials on patient's individual exercise prescription       Expected Outcomes  Short Term: Able to explain program exercise prescription;Long Term: Able to explain home exercise prescription to exercise independently          Nutrition & Weight - Outcomes: Pre Biometrics - 05/19/18 1437      Pre Biometrics   Height  '4\' 10"'$  (1.473 m)    Weight  141 lb 1.6 oz (  64 kg)    Waist Circumference  33.5 inches    Hip  Circumference  41.5 inches    Waist to Hip Ratio  0.81 %    BMI (Calculated)  29.5    Single Leg Stand  15.3 seconds      Post Biometrics - 07/08/18 0824       Post  Biometrics   Height  4' 10"  (1.473 m)    Weight  145 lb (65.8 kg)    Waist Circumference  33 inches    Hip Circumference  39 inches    Waist to Hip Ratio  0.85 %    BMI (Calculated)  30.31    Single Leg Stand  14.64 seconds       Nutrition: Nutrition Therapy & Goals - 06/06/18 0954      Nutrition Therapy   Diet  DASH    Protein (specify units)  6-7oz    Fiber  20 grams    Whole Grain Foods  3 servings   occasionally chooses whole grains   Saturated Fats  12 max. grams    Fruits and Vegetables  5 servings/day   8 ideal; eats vegetables daily, fruits 2x/wk   Sodium  1500 grams      Personal Nutrition Goals   Nutrition Goal  Eat fruits on a more regular basis, working from 2x/wk to at least once per day. If choosing canned fruit, buy the variety in 100% fruit juice rather than syrup    Personal Goal #2  Choose low salt canned vegetables, and practice rinsing them off before serving    Personal Goal #3  Start to reduce your intake of sugar sweetened beverages IE sweet tea and soda, and replace them with water or unsweetened varieties. This is especially important because your daily fluid intake is limited between 40-60oz/day    Comments  She has started to think about lower sodium eating, but continues to salt foods she cooks because she is cooking for the family. She has been instructed to reduce fluid intake d/t ckd. Likes a variety of vegetables and meats. Cooks mostly in vegetable and canola oil, sometimes butter      Intervention Plan   Intervention  Prescribe, educate and counsel regarding individualized specific dietary modifications aiming towards targeted core components such as weight, hypertension, lipid management, diabetes, heart failure and other comorbidities.;Nutrition handout(s) given to patient.    low sodium swaps handout   Expected Outcomes  Short Term Goal: A plan has been developed with personal nutrition goals set during dietitian appointment.;Long Term Goal: Adherence to prescribed nutrition plan.;Short Term Goal: Understand basic principles of dietary content, such as calories, fat, sodium, cholesterol and nutrients.       Nutrition Discharge: Nutrition Assessments - 05/19/18 1423      MEDFICTS Scores   Pre Score  48       Education Questionnaire Score: Knowledge Questionnaire Score - 07/11/18 0841      Knowledge Questionnaire Score   Pre Score  22/26    Post Score  25/26   reviewed with pt today      Goals reviewed with patient; copy given to patient.

## 2018-07-13 NOTE — Patient Instructions (Signed)
Discharge Patient Instructions  Patient Details  Name: Melanie Holloway MRN: [id number] Date of Birth: 1966/04/01 Referring Provider:  Isaias Cowman, MD   Number of Visits: 36/36  Reason for Discharge:  Patient reached a stable level of exercise. Patient independent in their exercise. Patient has met program and personal goals.  Smoking History:  Social History   Tobacco Use  Smoking Status Never Smoker  Smokeless Tobacco Never Used    Diagnosis:  Heart failure, chronic systolic (HCC)  Initial Exercise Prescription: Initial Exercise Prescription - 05/19/18 1400      Date of Initial Exercise RX and Referring Provider   Date  05/19/18    Referring Provider  Paraschos      Treadmill   MPH  1    Grade  0    Minutes  15    METs  1.77      NuStep   Level  1    SPM  80    Minutes  15    METs  2      Biostep-RELP   Level  1    SPM  50    Minutes  15    METs  2      Track   Laps  15    Minutes  15    METs  1.7      Prescription Details   Frequency (times per week)  3    Duration  Progress to 45 minutes of aerobic exercise without signs/symptoms of physical distress      Intensity   THRR 40-80% of Max Heartrate  113-149    Ratings of Perceived Exertion  11-13    Perceived Dyspnea  0-4      Resistance Training   Training Prescription  Yes    Weight  2 lb    Reps  10-15       Discharge Exercise Prescription (Final Exercise Prescription Changes): Exercise Prescription Changes - 07/04/18 1500      Response to Exercise   Blood Pressure (Admit)  126/64    Blood Pressure (Exercise)  142/80    Blood Pressure (Exit)  122/80    Heart Rate (Admit)  96 bpm    Heart Rate (Exercise)  134 bpm    Heart Rate (Exit)  83 bpm    Rating of Perceived Exertion (Exercise)  13    Symptoms  none    Comments  BioStep is very hard for her    Duration  Continue with 45 min of aerobic exercise without signs/symptoms of physical distress.    Intensity  THRR unchanged       Progression   Progression  Continue to progress workloads to maintain intensity without signs/symptoms of physical distress.    Average METs  1.72      Resistance Training   Training Prescription  Yes    Weight  3 lbs    Reps  10-15      Interval Training   Interval Training  No      Treadmill   MPH  1.5    Grade  1    Minutes  15    METs  2.35      NuStep   Level  4    Minutes  15    METs  1.8      Biostep-RELP   Level  1    Minutes  15    METs  1      Home Exercise Plan   Plans  to continue exercise at  Home (comment)   walking, stair climber   Frequency  Add 2 additional days to program exercise sessions.    Initial Home Exercises Provided  06/03/18       Functional Capacity: 6 Minute Walk    Row Name 05/19/18 1439 07/08/18 0824       6 Minute Walk   Phase  Initial  Discharge    Distance  600 feet  1014 feet    Distance % Change  -  69 %    Distance Feet Change  -  414 ft    Walk Time  6 minutes  6 minutes    # of Rest Breaks  0  0    MPH  1.13  1.92    METS  2.2  2.9    RPE  12  12    Perceived Dyspnea   2  0    VO2 Peak  7.73  10.27    Symptoms  No  No    Resting HR  78 bpm  81 bpm    Resting BP  102/66  126/64    Resting Oxygen Saturation   98 %  -    Exercise Oxygen Saturation  during 6 min walk  99 %  -    Max Ex. HR  100 bpm  97 bpm    Max Ex. BP  114/60  124/68    2 Minute Post BP  108/64  -       Quality of Life: Quality of Life - 07/11/18 0758      Quality of Life   Select  Quality of Life      Quality of Life Scores   Health/Function Pre  24.6 %    Health/Function Post  28.03 %    Health/Function % Change  13.94 %    Socioeconomic Pre  27.1 %    Socioeconomic Post  28.93 %    Socioeconomic % Change   6.75 %    Psych/Spiritual Pre  25.71 %    Psych/Spiritual Post  29.14 %    Psych/Spiritual % Change  13.34 %    Family Pre  27.6 %    Family Post  30 %    Family % Change  8.7 %    GLOBAL Pre  25.91 %    GLOBAL Post   28.74 %    GLOBAL % Change  10.92 %       Personal Goals: Goals established at orientation with interventions provided to work toward goal. Personal Goals and Risk Factors at Admission - 05/19/18 1422      Core Components/Risk Factors/Patient Goals on Admission    Weight Management  Yes;Weight Maintenance    Intervention  Weight Management: Provide education and appropriate resources to help participant work on and attain dietary goals.;Weight Management: Develop a combined nutrition and exercise program designed to reach desired caloric intake, while maintaining appropriate intake of nutrient and fiber, sodium and fats, and appropriate energy expenditure required for the weight goal.    Admit Weight  141 lb (64 kg)    Expected Outcomes  Short Term: Continue to assess and modify interventions until short term weight is achieved;Long Term: Adherence to nutrition and physical activity/exercise program aimed toward attainment of established weight goal;Weight Maintenance: Understanding of the daily nutrition guidelines, which includes 25-35% calories from fat, 7% or less cal from saturated fats, less than 253m cholesterol, less than 1.5gm of sodium, & 5 or  more servings of fruits and vegetables daily;Understanding recommendations for meals to include 15-35% energy as protein, 25-35% energy from fat, 35-60% energy from carbohydrates, less than 269m of dietary cholesterol, 20-35 gm of total fiber daily;Understanding of distribution of calorie intake throughout the day with the consumption of 4-5 meals/snacks    Hypertension  Yes    Intervention  Provide education on lifestyle modifcations including regular physical activity/exercise, weight management, moderate sodium restriction and increased consumption of fresh fruit, vegetables, and low fat dairy, alcohol moderation, and smoking cessation.;Monitor prescription use compliance.    Expected Outcomes  Short Term: Continued assessment and intervention  until BP is < 140/989mHG in hypertensive participants. < 130/8082mG in hypertensive participants with diabetes, heart failure or chronic kidney disease.;Long Term: Maintenance of blood pressure at goal levels.        Personal Goals Discharge: Goals and Risk Factor Review - 07/06/18 0820      Core Components/Risk Factors/Patient Goals Review   Personal Goals Review  Weight Management/Obesity;Lipids;Hypertension    Review  They have added a new BP med for [employer] she had not taken in her record from home.  She does check it at home regularly, so she will take in her record for her next appointment.  He weight is starting to come down, she was 144 lbs today. She would like to continue to work on weight.  Overall she is doing well with her medications.   She has an appointment with her pulmonologist next Wednesday about joining Pulmonary Rehab or CARE after Cardiac is over.     Expected Outcomes  Short: Talk to doctor about Pulmonary vs CARE  Long: Continue to work on weight loss.        Exercise Goals and Review: Exercise Goals    Row Name 05/19/18 1438             Exercise Goals   Increase Physical Activity  Yes       Intervention  Provide advice, education, support and counseling about physical activity/exercise needs.;Develop an individualized exercise prescription for aerobic and resistive training based on initial evaluation findings, risk stratification, comorbidities and participant's personal goals.       Expected Outcomes  Short Term: Attend rehab on a regular basis to increase amount of physical activity.;Long Term: Add in home exercise to make exercise part of routine and to increase amount of physical activity.;Long Term: Exercising regularly at least 3-5 days a week.       Increase Strength and Stamina  Yes       Intervention  Provide advice, education, support and counseling about physical activity/exercise needs.;Develop an individualized exercise prescription for aerobic  and resistive training based on initial evaluation findings, risk stratification, comorbidities and participant's personal goals.       Expected Outcomes  Short Term: Increase workloads from initial exercise prescription for resistance, speed, and METs.;Short Term: Perform resistance training exercises routinely during rehab and add in resistance training at home;Long Term: Improve cardiorespiratory fitness, muscular endurance and strength as measured by increased METs and functional capacity (6MWT)       Able to understand and use rate of perceived exertion (RPE) scale  Yes       Intervention  Provide education and explanation on how to use RPE scale       Expected Outcomes  Short Term: Able to use RPE daily in rehab to express subjective intensity level;Long Term:  Able to use RPE to guide intensity level when exercising  independently       Able to understand and use Dyspnea scale  Yes       Intervention  Provide education and explanation on how to use Dyspnea scale       Expected Outcomes  Short Term: Able to use Dyspnea scale daily in rehab to express subjective sense of shortness of breath during exertion;Long Term: Able to use Dyspnea scale to guide intensity level when exercising independently       Knowledge and understanding of Target Heart Rate Range (THRR)  Yes       Intervention  Provide education and explanation of THRR including how the numbers were predicted and where they are located for reference       Expected Outcomes  Short Term: Able to state/look up THRR;Short Term: Able to use daily as guideline for intensity in rehab;Long Term: Able to use THRR to govern intensity when exercising independently       Able to check pulse independently  Yes       Intervention  Provide education and demonstration on how to check pulse in carotid and radial arteries.;Review the importance of being able to check your own pulse for safety during independent exercise       Expected Outcomes  Short Term:  Able to explain why pulse checking is important during independent exercise;Long Term: Able to check pulse independently and accurately       Understanding of Exercise Prescription  Yes       Intervention  Provide education, explanation, and written materials on patient's individual exercise prescription       Expected Outcomes  Short Term: Able to explain program exercise prescription;Long Term: Able to explain home exercise prescription to exercise independently          Nutrition & Weight - Outcomes: Pre Biometrics - 05/19/18 1437      Pre Biometrics   Height  _0  (1.473 m)    Weight  141 lb 1.6 oz (64 kg)    Waist Circumference  33.5 inches    Hip Circumference  41.5 inches    Waist to Hip Ratio  0.81 %    BMI (Calculated)  29.5    Single Leg Stand  15.3 seconds      Post Biometrics - 07/08/18 0824       Post  Biometrics   Height  _1  (1.473 m)    Weight  145 lb (65.8 kg)    Waist Circumference  33 inches    Hip Circumference  39 inches    Waist to Hip Ratio  0.85 %    BMI (Calculated)  30.31    Single Leg Stand  14.64 seconds       Nutrition: Nutrition Therapy & Goals - 06/06/18 0954      Nutrition Therapy   Diet  DASH    Protein (specify units)  6-7oz    Fiber  20 grams    Whole Grain Foods  3 servings   occasionally chooses whole grains   Saturated Fats  12 max. grams    Fruits and Vegetables  5 servings/day   8 ideal; eats vegetables daily, fruits 2x/wk   Sodium  1500 grams      Personal Nutrition Goals   Nutrition Goal  Eat fruits on a more regular basis, working from 2x/wk to at least once per day. If choosing canned fruit, buy the variety in 100% fruit juice rather than syrup    Personal Goal #2  Choose low salt canned vegetables, and practice rinsing them off before serving    Personal Goal #3  Start to reduce your intake of sugar sweetened beverages IE sweet tea and soda, and replace them with water or unsweetened varieties. This is especially  important because your daily fluid intake is limited between 40-60oz/day    Comments  She has started to think about lower sodium eating, but continues to salt foods she cooks because she is cooking for the family. She has been instructed to reduce fluid intake d/t ckd. Likes a variety of vegetables and meats. Cooks mostly in vegetable and canola oil, sometimes butter      Intervention Plan   Intervention  Prescribe, educate and counsel regarding individualized specific dietary modifications aiming towards targeted core components such as weight, hypertension, lipid management, diabetes, heart failure and other comorbidities.;Nutrition handout(s) given to patient.   low sodium swaps handout   Expected Outcomes  Short Term Goal: A plan has been developed with personal nutrition goals set during dietitian appointment.;Long Term Goal: Adherence to prescribed nutrition plan.;Short Term Goal: Understand basic principles of dietary content, such as calories, fat, sodium, cholesterol and nutrients.       Nutrition Discharge: Nutrition Assessments - 05/19/18 1423      MEDFICTS Scores   Pre Score  48       Education Questionnaire Score: Knowledge Questionnaire Score - 07/11/18 0841      Knowledge Questionnaire Score   Pre Score  22/26    Post Score  25/26   reviewed with pt today      Goals reviewed with patient; copy given to patient.

## 2018-07-13 NOTE — Progress Notes (Signed)
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 including breast CA and lupus. Hear heart failure is manageable for right now. She does need help with transportation so she has to rely on family.       Family Dynamics   Good Support System?  Yes   children     Barriers   Psychosocial barriers to participate in program  There are no identifiable barriers or psychosocial needs.;The patient should benefit from training in stress management and relaxation.      Screening Interventions   Interventions  Encouraged to exercise;Program counselor consult;To provide support and resources with identified psychosocial needs;Provide feedback about the scores to participant    Expected Outcomes  Short Term goal: Utilizing psychosocial counselor, staff and physician to assist with identification of specific Stressors or current issues interfering with healing process. Setting desired goal for each  stressor or current issue identified.;Long Term Goal: Stressors or current issues are controlled or eliminated.;Long Term goal: The participant improves quality of Life and PHQ9 Scores as seen by post scores and/or verbalization of changes;Short Term goal: Identification and review with participant of any Quality of Life or Depression concerns found by scoring the questionnaire.       Quality of Life Scores:  Quality of Life - 07/11/18 0758      Quality of Life   Select  Quality of Life      Quality of Life Scores   Health/Function Pre  24.6 %    Health/Function Post  28.03 %    Health/Function % Change  13.94 %    Socioeconomic Pre  27.1 %    Socioeconomic Post  28.93 %    Socioeconomic % Change   6.75 %    Psych/Spiritual Pre  25.71 %    Psych/Spiritual Post  29.14 %    Psych/Spiritual % Change  13.34 %    Family Pre  27.6 %  Family Post  30 %    Family % Change  8.7 %    GLOBAL Pre  25.91 %    GLOBAL Post  28.74 %    GLOBAL % Change  10.92 %      Scores of 19 and below usually indicate a poorer quality of life in these areas.  A difference of  2-3 points is a clinically meaningful difference.  A difference of 2-3 points in the total score of the Quality of Life Index has been associated with significant improvement in overall quality of life, self-image, physical symptoms, and general health in studies assessing change in quality of life.  PHQ-9: Recent Review Flowsheet Data    Depression screen [facility] 2/9 07/11/2018 06/29/2018 06/16/2018 05/19/2018 03/10/2018   Decreased Interest 0 0 0 0 0   Down, Depressed, Hopeless 0 0 0 0 0   PHQ - 2 Score 0 0 0 0 0   Altered sleeping 0 - - 0 -   Tired, decreased energy 1 - - 1 -   Change in appetite 0 - - 0 -   Feeling bad or failure about yourself  0 - - 1 -   Trouble concentrating 0 - - 0 -   Moving slowly or fidgety/restless 0 - - 0 -   Suicidal thoughts 0 - - 0 -   PHQ-9 Score 1 - - 2 -   Difficult doing work/chores Not difficult at  all - - Not difficult at all -     Interpretation of Total Score  Total Score Depression Severity:  1-4 = Minimal depression, 5-9 = Mild depression, 10-14 = Moderate depression, 15-19 = Moderately severe depression, 20-27 = Severe depression   Psychosocial Evaluation and Intervention: Psychosocial Evaluation - 05/30/18 0940      Psychosocial Evaluation & Interventions   Interventions  Stress management education;Relaxation education;Encouraged to exercise with the program and follow exercise prescription    Comments  Counselor met with Ms. Falconi Malachy Mood) for initial psychosocial evaluation.  She is a 52 year old who has been diagnosed with Lupus and CHF this past January.  Galilea has a strong support system with a spouse of 24 years; (3) adult children locally; sisters and active involvement in her local church.  She reports sleeping well and having a good appetite.  She denies a history of depression or anxiety or any current symptoms and is typically in a positive mood.  Tiauna reports her health and finances are her primary stressors; and she has applied for disability to help with $.  Her goals for this program are to increase her strength and improve her balance.  Myldred is a breast cancer survivor of over 10 years and hopes to be able to attend the CARE program to continue exercising upon completion of this program.  STaff will follow with her.     Expected Outcomes  Short:  Shemika will exercise regularly to increase her stamina and improve her balance.   Long:  Letecia will exercise and develop coping strategies for her health and to manage stress.     Continue Psychosocial Services   Follow up required by staff       Psychosocial Re-Evaluation: Psychosocial Re-Evaluation    [place] Name 07/06/18 [phone]             Psychosocial Re-Evaluation   Current issues with  Current Stress Concerns       Comments  Chreyl is doing well mentally.  She is already planning  to to join either CARE or  Pulmonary after cardiac.   She has been given her disablity which has alleviated the financial burden.  She wants to maintain her certifications which would require her to work some.  I talked to her about making sure that she talks to her social services conselor about this. She is sleeping well but needs to get to bed sooner.        Expected Outcomes  Short: Talk to doctor about continuing in rehab.  Long: Try to get to bed sooner and practice self care.        Interventions  Stress management education;Encouraged to attend Cardiac Rehabilitation for the exercise       Continue Psychosocial Services   Follow up required by staff          Psychosocial Discharge (Final Psychosocial Re-Evaluation): Psychosocial Re-Evaluation - 07/06/18 0086      Psychosocial Re-Evaluation   Current issues with  Current Stress Concerns    Comments  Chreyl is doing well mentally.  She is already planning to to join either CARE or Pulmonary after cardiac.   She has been given her disablity which has alleviated the financial burden.  She wants to maintain her certifications which would require her to work some.  I talked to her about making sure that she talks to her social services conselor about this. She is sleeping well but needs to get to bed sooner.     Expected Outcomes  Short: Talk to doctor about continuing in rehab.  Long: Try to get to bed sooner and practice self care.     Interventions  Stress management education;Encouraged to attend Cardiac Rehabilitation for the exercise    Continue Psychosocial Services   Follow up required by staff       Vocational Rehabilitation: Provide vocational rehab assistance to qualifying candidates.   Vocational Rehab Evaluation & Intervention: Vocational Rehab - 05/19/18 1431      Initial Vocational Rehab Evaluation & Intervention   Assessment shows need for Vocational Rehabilitation  No       Education: Education Goals: Education classes will be provided on a  variety of topics geared toward better understanding of heart health and risk factor modification. Participant will state understanding/return demonstration of topics presented as noted by education test scores.  Learning Barriers/Preferences: Learning Barriers/Preferences - 05/19/18 1428      Learning Barriers/Preferences   Learning Barriers  None    Learning Preferences  None       Education Topics:  AED/CPR: - Group verbal and written instruction with the use of models to demonstrate the basic use of the AED with the basic ABC's of resuscitation.   Cardiac Rehab from 07/13/2018 in [facility] Cardiac and Pulmonary Rehab  Date  07/04/18  Educator  SB  Instruction Review Code  1- Verbalizes Understanding      General Nutrition Guidelines/Fats and Fiber: -Group instruction provided by verbal, written material, models and posters to present the general guidelines for heart healthy nutrition. Gives an explanation and review of dietary fats and fiber.   Cardiac Rehab from 07/13/2018 in [facility] Cardiac and Pulmonary Rehab  Date  06/27/18  Educator  LB  Instruction Review Code  1- Verbalizes Understanding      Controlling Sodium/Reading Food Labels: -Group verbal and written material supporting the discussion of sodium use in heart healthy nutrition. Review and explanation with models, verbal and written materials for utilization of the food label.   Cardiac Rehab from 07/13/2018  in [facility] Cardiac and Pulmonary Rehab  Date  07/06/18  Educator  LB  Instruction Review Code  1- Verbalizes Understanding      Exercise Physiology & General Exercise Guidelines: - Group verbal and written instruction with models to review the exercise physiology of the cardiovascular system and associated critical values. Provides general exercise guidelines with specific guidelines to those with heart or lung disease.    Cardiac Rehab from 07/13/2018 in [facility] Cardiac and Pulmonary Rehab  Date  07/13/18  Educator  [facility]   Instruction Review Code  1- Verbalizes Understanding      Aerobic Exercise & Resistance Training: - Gives group verbal and written instruction on the various components of exercise. Focuses on aerobic and resistive training programs and the benefits of this training and how to safely progress through these programs..   Cardiac Rehab from 07/13/2018 in [facility] Cardiac and Pulmonary Rehab  Date  05/23/18  Educator  [facility]  Instruction Review Code  1- Verbalizes Understanding      Flexibility, Balance, Mind/Body Relaxation: Provides group verbal/written instruction on the benefits of flexibility and balance training, including mind/body exercise modes such as yoga, pilates and tai chi.  Demonstration and skill practice provided.   Cardiac Rehab from 07/13/2018 in [facility] Cardiac and Pulmonary Rehab  Date  05/30/18  Educator  [facility]  Instruction Review Code  1- Verbalizes Understanding      Stress and Anxiety: - Provides group verbal and written instruction about the health risks of elevated stress and causes of high stress.  Discuss the correlation between heart/lung disease and anxiety and treatment options. Review healthy ways to manage with stress and anxiety.   Cardiac Rehab from 07/13/2018 in [facility] Cardiac and Pulmonary Rehab  Date  06/08/18  Educator  [facility]  Instruction Review Code  1- Verbalizes Understanding      Depression: - Provides group verbal and written instruction on the correlation between heart/lung disease and depressed mood, treatment options, and the stigmas associated with seeking treatment.   Cardiac Rehab from 07/13/2018 in [facility] Cardiac and Pulmonary Rehab  Date  05/25/18  Educator  [facility]  Instruction Review Code  1- Verbalizes Understanding      Anatomy & Physiology of the Heart: - Group verbal and written instruction and models provide basic cardiac anatomy and physiology, with the coronary electrical and arterial systems. Review of Valvular disease and Heart Failure    Cardiac Rehab from 07/13/2018 in [facility] Cardiac and Pulmonary Rehab  Date  06/06/18  Educator  CE  Instruction Review Code  1- Verbalizes Understanding      Cardiac Procedures: - Group verbal and written instruction to review commonly prescribed medications for heart disease. Reviews the medication, class of the drug, and side effects. Includes the steps to properly store meds and maintain the prescription regimen. (beta blockers and nitrates)   Cardiac Rehab from 07/13/2018 in [facility] Cardiac and Pulmonary Rehab  Date  06/29/18  Educator  SB  Instruction Review Code  1- Verbalizes Understanding      Cardiac Medications I: - Group verbal and written instruction to review commonly prescribed medications for heart disease. Reviews the medication, class of the drug, and side effects. Includes the steps to properly store meds and maintain the prescription regimen.   Cardiac Medications II: -Group verbal and written instruction to review commonly prescribed medications for heart disease. Reviews the medication, class of the drug, and side effects. (all other drug classes)   Cardiac Rehab from 07/13/2018 in [facility] Cardiac and  Pulmonary Rehab  Date  06/01/18  Educator  [place]  Instruction Review Code  1- Verbalizes Understanding       Go Sex-Intimacy & Heart Disease, Get SMART - Goal Setting: - Group verbal and written instruction through game format to discuss heart disease and the return to sexual intimacy. Provides group verbal and written material to discuss and apply goal setting through the application of the S.M.A.R.T. Method.   Cardiac Rehab from 07/13/2018 in [facility] Cardiac and Pulmonary Rehab  Date  06/29/18  Educator  SB  Instruction Review Code  1- Verbalizes Understanding      Other Matters of the Heart: - Provides group verbal, written materials and models to describe Stable Angina and Peripheral Artery. Includes description of the disease process and treatment options available to the  cardiac patient.   Cardiac Rehab from 07/13/2018 in [facility] Cardiac and Pulmonary Rehab  Date  06/06/18  Educator  CE  Instruction Review Code  1- Verbalizes Understanding      Exercise & Equipment Safety: - Individual verbal instruction and demonstration of equipment use and safety with use of the equipment.   Cardiac Rehab from 07/13/2018 in [facility] Cardiac and Pulmonary Rehab  Date  05/19/18  Educator  [facility]  Instruction Review Code  1- Verbalizes Understanding      Infection Prevention: - Provides verbal and written material to individual with discussion of infection control including proper hand washing and proper equipment cleaning during exercise session.   Cardiac Rehab from 07/13/2018 in [facility] Cardiac and Pulmonary Rehab  Date  05/19/18  Educator  [facility]  Instruction Review Code  1- Verbalizes Understanding      Falls Prevention: - Provides verbal and written material to individual with discussion of falls prevention and safety.   Cardiac Rehab from 07/13/2018 in [facility] Cardiac and Pulmonary Rehab  Date  05/19/18  Educator  [facility]  Instruction Review Code  1- Verbalizes Understanding      Diabetes: - Individual verbal and written instruction to review signs/symptoms of diabetes, desired ranges of glucose level fasting, after meals and with exercise. Acknowledge that pre and post exercise glucose checks will be done for 3 sessions at entry of program.   Know Your Numbers and Risk Factors: -Group verbal and written instruction about important numbers in your health.  Discussion of what are risk factors and how they play a role in the disease process.  Review of Cholesterol, Blood Pressure, Diabetes, and BMI and the role they play in your overall health.   Cardiac Rehab from 07/13/2018 in [facility] Cardiac and Pulmonary Rehab  Date  06/01/18  Educator  [place]  Instruction Review Code  1- Verbalizes Understanding      Sleep Hygiene: -Provides group verbal and written instruction about how sleep  can affect your health.  Define sleep hygiene, discuss sleep cycles and impact of sleep habits. Review good sleep hygiene tips.    Cardiac Rehab from 07/13/2018 in [facility] Cardiac and Pulmonary Rehab  Date  06/22/18  Educator  [facility]  Instruction Review Code  1- Verbalizes Understanding      Other: -Provides group and verbal instruction on various topics (see comments)   Knowledge Questionnaire Score: Knowledge Questionnaire Score - 07/11/18 0841      Knowledge Questionnaire Score   Pre Score  22/26    Post Score  25/26   reviewed with pt today      Core Components/Risk Factors/Patient Goals at Admission: Personal Goals and Risk Factors at Admission - 05/19/18 1422  Core Components/Risk Factors/Patient Goals on Admission    Weight Management  Yes;Weight Maintenance    Intervention  Weight Management: Provide education and appropriate resources to help participant work on and attain dietary goals.;Weight Management: Develop a combined nutrition and exercise program designed to reach desired caloric intake, while maintaining appropriate intake of nutrient and fiber, sodium and fats, and appropriate energy expenditure required for the weight goal.    Admit Weight  141 lb (64 kg)    Expected Outcomes  Short Term: Continue to assess and modify interventions until short term weight is achieved;Long Term: Adherence to nutrition and physical activity/exercise program aimed toward attainment of established weight goal;Weight Maintenance: Understanding of the daily nutrition guidelines, which includes 25-35% calories from fat, 7% or less cal from saturated fats, less than '200mg'$  cholesterol, less than 1.5gm of sodium, & 5 or more servings of fruits and vegetables daily;Understanding recommendations for meals to include 15-35% energy as protein, 25-35% energy from fat, 35-60% energy from carbohydrates, less than '200mg'$  of dietary cholesterol, 20-35 gm of total fiber daily;Understanding of distribution of  calorie intake throughout the day with the consumption of 4-5 meals/snacks    Hypertension  Yes    Intervention  Provide education on lifestyle modifcations including regular physical activity/exercise, weight management, moderate sodium restriction and increased consumption of fresh fruit, vegetables, and low fat dairy, alcohol moderation, and smoking cessation.;Monitor prescription use compliance.    Expected Outcomes  Short Term: Continued assessment and intervention until BP is < 140/30m HG in hypertensive participants. < 130/816mHG in hypertensive participants with diabetes, heart failure or chronic kidney disease.;Long Term: Maintenance of blood pressure at goal levels.       Core Components/Risk Factors/Patient Goals Review:  Goals and Risk Factor Review    Row Name 06/10/18 0832959/18/19 0820           Core Components/Risk Factors/Patient Goals Review   Personal Goals Review  Weight Management/Obesity;Lipids;Hypertension  Weight Management/Obesity;Lipids;Hypertension      Review  ChCerinas taking meds as directed.  She checks BP at home and it runs 120s/70s.  She says she feels more stable when walking since starting HT. The muscle soreness from exercise is getting better compared to first sessions. ChMalachy Moodet with RD and has started changing some things she eats such as brown rice instead of white and less sodium.   They have added a new BP med for [place] she had not taken in her record from home.  She does check it at home regularly, so she will take in her record for her next appointment.  He weight is starting to come down, she was 144 lbs today. She would like to continue to work on weight.  Overall she is doing well with her medications.   She has an appointment with her pulmonologist next Wednesday about joining Pulmonary Rehab or CARE after Cardiac is over.       Expected Outcomes  Short - continue to attend and make gradual dietary changes Long - maintain helthy exercise and  dietary habits  Short: Talk to doctor about Pulmonary vs CARE  Long: Continue to work on weight loss.          Core Components/Risk Factors/Patient Goals at Discharge (Final Review):  Goals and Risk Factor Review - 07/06/18 0820      Core Components/Risk Factors/Patient Goals Review   Personal Goals Review  Weight Management/Obesity;Lipids;Hypertension    Review  [facility]  but she had not taken in her record from home.  She does check it at home regularly, so she will take in her record for her next appointment.  He weight is starting to come down, she was 144 lbs today. She would like to continue to work on weight.  Overall she is doing well with her medications.   She has an appointment with her pulmonologist next Wednesday about joining Pulmonary Rehab or CARE after Cardiac is over.     Expected Outcomes  Short: Talk to doctor about Pulmonary vs CARE  Long: Continue to work on weight loss.        ITP Comments: ITP Comments    Row Name 05/19/18 1421 06/01/18 0755 06/29/18 0540 07/13/18 0832     ITP Comments  Med Review completed. Initial ITP created. Diagnosis [facility] be found in [facility] 5/16  30 day review completed. ITP sent to Dr. Ramonita Lab, covering for Dr. Emily Filbert, Medical Director of Cardiac Rehab. Continue with ITP unless changes are made by physician.  New to program  30 day review completed. ITP sent to Dr. Emily Filbert, Medical Director of Cardiac Rehab. Continue with ITP unless changes are made by physician  Discharge ITP sent and signed by Dr. Sabra Heck.  Discharge Summary routed to PCP and cardiologist.       Comments: Discharge ITP

## 2018-07-18 NOTE — Telephone Encounter (Signed)
Let's review this at next visit, or have her ask cardiology at next visit.  If worsening dyspnea in interim, please return here or see cards sooner than scheduled f/u next year.

## 2018-07-19 NOTE — Telephone Encounter (Signed)
Spoke with pt relaying Dr. G's message. Pt verbalizes understanding.  

## 2018-08-02 ENCOUNTER — Encounter: Payer: 59 | Admitting: Family Medicine

## 2018-08-25 DIAGNOSIS — M3214 Glomerular disease in systemic lupus erythematosus: Secondary | ICD-10-CM | POA: Diagnosis not present

## 2018-08-25 DIAGNOSIS — I10 Essential (primary) hypertension: Secondary | ICD-10-CM | POA: Diagnosis not present

## 2018-08-25 DIAGNOSIS — I5022 Chronic systolic (congestive) heart failure: Secondary | ICD-10-CM | POA: Diagnosis not present

## 2018-08-30 ENCOUNTER — Encounter: Payer: Self-pay | Admitting: Family Medicine

## 2018-08-30 ENCOUNTER — Ambulatory Visit (INDEPENDENT_AMBULATORY_CARE_PROVIDER_SITE_OTHER): Payer: 59 | Admitting: Family Medicine

## 2018-08-30 DIAGNOSIS — N930 Postcoital and contact bleeding: Secondary | ICD-10-CM | POA: Diagnosis not present

## 2018-08-30 DIAGNOSIS — Z01419 Encounter for gynecological examination (general) (routine) without abnormal findings: Secondary | ICD-10-CM

## 2018-08-30 DIAGNOSIS — Z1151 Encounter for screening for human papillomavirus (HPV): Secondary | ICD-10-CM

## 2018-08-30 DIAGNOSIS — Z124 Encounter for screening for malignant neoplasm of cervix: Secondary | ICD-10-CM | POA: Diagnosis not present

## 2018-08-30 NOTE — Assessment & Plan Note (Signed)
She declined pelvic sonogram today--suspect vaginal dryness, but letrozole use and age, may warrant w/u for PMB to include EMB and pelvic sono.

## 2018-08-30 NOTE — Patient Instructions (Signed)
Preventive Care 40-64 Years, Female Preventive care refers to lifestyle choices and visits with your health care provider that can promote health and wellness. What does preventive care include?  A yearly physical exam. This is also called an annual well check.  Dental exams once or twice a year.  Routine eye exams. Ask your health care provider how often you should have your eyes checked.  Personal lifestyle choices, including: ? Daily care of your teeth and gums. ? Regular physical activity. ? Eating a healthy diet. ? Avoiding tobacco and drug use. ? Limiting alcohol use. ? Practicing safe sex. ? Taking low-dose aspirin daily starting at age 58. ? Taking vitamin and mineral supplements as recommended by your health care provider. What happens during an annual well check? The services and screenings done by your health care provider during your annual well check will depend on your age, overall health, lifestyle risk factors, and family history of disease. Counseling Your health care provider may ask you questions about your:  Alcohol use.  Tobacco use.  Drug use.  Emotional well-being.  Home and relationship well-being.  Sexual activity.  Eating habits.  Work and work Statistician.  Method of birth control.  Menstrual cycle.  Pregnancy history.  Screening You may have the following tests or measurements:  Height, weight, and BMI.  Blood pressure.  Lipid and cholesterol levels. These may be checked every 5 years, or more frequently if you are over 81 years old.  Skin check.  Lung cancer screening. You may have this screening every year starting at age 78 if you have a 30-pack-year history of smoking and currently smoke or have quit within the past 15 years.  Fecal occult blood test (FOBT) of the stool. You may have this test every year starting at age 65.  Flexible sigmoidoscopy or colonoscopy. You may have a sigmoidoscopy every 5 years or a colonoscopy  every 10 years starting at age 30.  Hepatitis C blood test.  Hepatitis B blood test.  Sexually transmitted disease (STD) testing.  Diabetes screening. This is done by checking your blood sugar (glucose) after you have not eaten for a while (fasting). You may have this done every 1-3 years.  Mammogram. This may be done every 1-2 years. Talk to your health care provider about when you should start having regular mammograms. This may depend on whether you have a family history of breast cancer.  BRCA-related cancer screening. This may be done if you have a family history of breast, ovarian, tubal, or peritoneal cancers.  Pelvic exam and Pap test. This may be done every 3 years starting at age 80. Starting at age 36, this may be done every 5 years if you have a Pap test in combination with an HPV test.  Bone density scan. This is done to screen for osteoporosis. You may have this scan if you are at high risk for osteoporosis.  Discuss your test results, treatment options, and if necessary, the need for more tests with your health care provider. Vaccines Your health care provider may recommend certain vaccines, such as:  Influenza vaccine. This is recommended every year.  Tetanus, diphtheria, and acellular pertussis (Tdap, Td) vaccine. You may need a Td booster every 10 years.  Varicella vaccine. You may need this if you have not been vaccinated.  Zoster vaccine. You may need this after age 5.  Measles, mumps, and rubella (MMR) vaccine. You may need at least one dose of MMR if you were born in  1957 or later. You may also need a second dose.  Pneumococcal 13-valent conjugate (PCV13) vaccine. You may need this if you have certain conditions and were not previously vaccinated.  Pneumococcal polysaccharide (PPSV23) vaccine. You may need one or two doses if you smoke cigarettes or if you have certain conditions.  Meningococcal vaccine. You may need this if you have certain  conditions.  Hepatitis A vaccine. You may need this if you have certain conditions or if you travel or work in places where you may be exposed to hepatitis A.  Hepatitis B vaccine. You may need this if you have certain conditions or if you travel or work in places where you may be exposed to hepatitis B.  Haemophilus influenzae type b (Hib) vaccine. You may need this if you have certain conditions.  Talk to your health care provider about which screenings and vaccines you need and how often you need them. This information is not intended to replace advice given to you by your health care provider. Make sure you discuss any questions you have with your health care provider. Document Released: 11/01/2015 Document Revised: 06/24/2016 Document Reviewed: 08/06/2015 Elsevier Interactive Patient Education  2018 Elsevier Inc.  

## 2018-08-30 NOTE — Addendum Note (Signed)
Addended by: Crosby Oyster on: 08/30/2018 10:04 AM   Modules accepted: Orders

## 2018-08-30 NOTE — Progress Notes (Signed)
  Subjective:     Melanie Holloway is a 52 y.o. female and is here for a comprehensive physical exam. The patient reports problems - has had a year. Hospitalized x 30 days and again, diagnosed with SLE with lung and kidney issues. Has CHF, last EF 25%. Has not been working. Has been menopausal since treatment for breast ca. Survivor x 10 years. Had mammogram in 9/19 WNL. S/p left mastectomy. On letrozole indefinitely. Feeling better, being managed by Rheumatology, Cardiology on a lot of meds. Labs with PCP. Has had flu shot. Declines colonoscopy. Reports some mild bleeding after sex.  The following portions of the patient's history were reviewed and updated as appropriate: allergies, current medications, past family history, past medical history, past social history, past surgical history and problem list.  Review of Systems Pertinent items noted in HPI and remainder of comprehensive ROS otherwise negative.   Objective:    BP 116/80   Pulse 80   Wt 145 lb (65.8 kg)   LMP 03/04/2009 (Approximate)   BMI 30.31 kg/m  General appearance: alert, cooperative and appears stated age Head: Normocephalic, without obvious abnormality, atraumatic Neck: supple, symmetrical, trachea midline Lungs: clear to auscultation bilaterally Heart: regular rate and rhythm Abdomen: soft, non-tender; bowel sounds normal; no masses,  no organomegaly Pelvic: cervix normal in appearance, external genitalia normal, no adnexal masses or tenderness, no cervical motion tenderness, uterus normal size, shape, and consistency and vaginal atrophy Extremities: Homans sign is negative, no sign of DVT Pulses: 2+ and symmetric Neurologic: Grossly normal    Assessment:    GYN female exam.      Plan:      Problem List Items Addressed This Visit      Unprioritized   Postcoital bleeding    She declined pelvic sonogram today--suspect vaginal dryness, but letrozole use and age, may warrant w/u for PMB to include EMB and pelvic  sono.       Other Visit Diagnoses    Screening for malignant neoplasm of cervix       Encounter for gynecological examination without abnormal finding       consider cologuard testing?     Return in 1 year (on 08/31/2019).  See After Visit Summary for Counseling Recommendations

## 2018-09-01 LAB — CYTOLOGY - PAP
DIAGNOSIS: NEGATIVE
HPV: NOT DETECTED

## 2018-09-14 ENCOUNTER — Other Ambulatory Visit: Payer: Self-pay | Admitting: Family Medicine

## 2018-10-06 DIAGNOSIS — R319 Hematuria, unspecified: Secondary | ICD-10-CM | POA: Diagnosis not present

## 2018-10-06 DIAGNOSIS — R809 Proteinuria, unspecified: Secondary | ICD-10-CM | POA: Diagnosis not present

## 2018-10-06 DIAGNOSIS — I129 Hypertensive chronic kidney disease with stage 1 through stage 4 chronic kidney disease, or unspecified chronic kidney disease: Secondary | ICD-10-CM | POA: Diagnosis not present

## 2018-10-06 DIAGNOSIS — M3214 Glomerular disease in systemic lupus erythematosus: Secondary | ICD-10-CM | POA: Diagnosis not present

## 2018-10-06 DIAGNOSIS — N183 Chronic kidney disease, stage 3 (moderate): Secondary | ICD-10-CM | POA: Diagnosis not present

## 2018-10-12 ENCOUNTER — Telehealth: Payer: Self-pay | Admitting: Family Medicine

## 2018-10-12 DIAGNOSIS — Z1211 Encounter for screening for malignant neoplasm of colon: Secondary | ICD-10-CM

## 2018-10-12 NOTE — Telephone Encounter (Signed)
plz call - I don't think we've discussed colon cancer screening in the past but we should - I recommend we start with iFOB stool kit. Please have her come to pick up kit if she's agreeable.

## 2018-10-13 NOTE — Telephone Encounter (Signed)
Spoke with pt relaying Dr. Synthia Innocent message. Pt verbalizes understanding. Pt states having a colonoscopy was discussed by she is on the fence.  She will consider iFOB and will let Dr. Darnell Level know. Fyi to Dr. Darnell Level.

## 2018-10-17 DIAGNOSIS — C50912 Malignant neoplasm of unspecified site of left female breast: Secondary | ICD-10-CM | POA: Diagnosis not present

## 2018-11-17 DIAGNOSIS — R809 Proteinuria, unspecified: Secondary | ICD-10-CM | POA: Diagnosis not present

## 2018-11-17 DIAGNOSIS — R319 Hematuria, unspecified: Secondary | ICD-10-CM | POA: Diagnosis not present

## 2018-11-17 DIAGNOSIS — R768 Other specified abnormal immunological findings in serum: Secondary | ICD-10-CM | POA: Diagnosis not present

## 2018-11-28 DIAGNOSIS — I428 Other cardiomyopathies: Secondary | ICD-10-CM | POA: Diagnosis not present

## 2018-11-28 DIAGNOSIS — I5022 Chronic systolic (congestive) heart failure: Secondary | ICD-10-CM | POA: Diagnosis not present

## 2018-11-28 DIAGNOSIS — I10 Essential (primary) hypertension: Secondary | ICD-10-CM | POA: Diagnosis not present

## 2018-12-08 DIAGNOSIS — R768 Other specified abnormal immunological findings in serum: Secondary | ICD-10-CM | POA: Diagnosis not present

## 2018-12-08 DIAGNOSIS — R319 Hematuria, unspecified: Secondary | ICD-10-CM | POA: Diagnosis not present

## 2018-12-08 DIAGNOSIS — R809 Proteinuria, unspecified: Secondary | ICD-10-CM | POA: Diagnosis not present

## 2018-12-19 ENCOUNTER — Ambulatory Visit (INDEPENDENT_AMBULATORY_CARE_PROVIDER_SITE_OTHER): Payer: 59 | Admitting: Family Medicine

## 2018-12-19 ENCOUNTER — Encounter: Payer: Self-pay | Admitting: Family Medicine

## 2018-12-19 ENCOUNTER — Other Ambulatory Visit: Payer: Self-pay | Admitting: Family Medicine

## 2018-12-19 VITALS — BP 148/84 | HR 70 | Temp 99.0°F | Ht <= 58 in | Wt 156.2 lb

## 2018-12-19 DIAGNOSIS — Z1211 Encounter for screening for malignant neoplasm of colon: Secondary | ICD-10-CM | POA: Diagnosis not present

## 2018-12-19 DIAGNOSIS — I5022 Chronic systolic (congestive) heart failure: Secondary | ICD-10-CM | POA: Diagnosis not present

## 2018-12-19 DIAGNOSIS — I10 Essential (primary) hypertension: Secondary | ICD-10-CM

## 2018-12-19 DIAGNOSIS — M3214 Glomerular disease in systemic lupus erythematosus: Secondary | ICD-10-CM

## 2018-12-19 NOTE — Assessment & Plan Note (Signed)
Chronic, mildly elevated today.  Discussed home regimen - she is unsure if she's taking hctz at home. rec check at home, discontinue if she is (as already on torsemide). rec retrial of losartan 25mg  daily. Has f/u with renal in 3 wks - reassess BP control then.

## 2018-12-19 NOTE — Assessment & Plan Note (Signed)
Appreciate nephrology care.  

## 2018-12-19 NOTE — Patient Instructions (Addendum)
Restart losartan 25mg  daily.  Check at home if you're taking hydrochlorothiazide - if you are, trial off this medicine and then touch base with Dr Juleen China.  We will refer you to GI for colonoscopy.  Return as needed or in 6 months for physical.

## 2018-12-19 NOTE — Progress Notes (Signed)
BP (!) 148/84 (BP Location: Right Arm, Cuff Size: Large)   Pulse 70   Temp 99 F (37.2 C) (Oral)   Ht 4\' 9"  (1.448 m)   Wt 156 lb 3 oz (70.8 kg)   LMP 03/04/2009 (Approximate)   SpO2 100%   BMI 33.80 kg/m    CC: 6 mo f/u visit Subjective:    Patient ID: Melanie Holloway, female    DOB: 08/16/1966, 53 y.o.   MRN: [id number]  HPI: Melanie Holloway is a 53 y.o. female presenting on 12/19/2018 for Follow-up (Here for 6 mo f/u.)   HTN - compliant with hctz 25mg  daily and toprol XL 50mg  daily and torsemide 40mg  bid. Dr Juleen China started her on losartan 25-50mg  for BP however she noted worsening dizziness with this. She has planned f/u with Dr Juleen China later this month.   Colon cancer screening - has declined colonoscopy. Discussed options again - agrees to colonoscopy   Saw CHF clinic 06/2018 for known sCHF, stable evaluation.  Saw Rheum Jefm Bryant) 06/2018 for SLE with stage 4 nephritis, h/o pericarditis, skin rash, coombs positive anemia, ITP. Now off prednisone. H/o thalassemia. Regularly sees nephrology. Asks about pulm referral for chronic dyspnea.   Saw cards 08/2018, 11/2018 Jefm Bryant Dr Saralyn Pilar), stable evaluation.   Had well woman exam 08/2018.     Relevant past medical, surgical, family and social history reviewed and updated as indicated. Interim medical history since our last visit reviewed. Allergies and medications reviewed and updated. Outpatient Medications Prior to Visit  Medication Sig Dispense Refill  . acetaminophen (TYLENOL) 325 MG tablet Take 2 tablets (650 mg total) by mouth every 4 (four) hours as needed for headache or mild pain. 30 tablet 0  . Cholecalciferol (VITAMIN D) 2000 UNITS CAPS Take 1 capsule (2,000 Units total) by mouth daily. 30 capsule   . clobetasol cream (TEMOVATE) 3.41 % Apply 1 application topically 2 (two) times daily.    Marland Kitchen letrozole (FEMARA) 2.5 MG tablet Take 1 tablet (2.5 mg total) by mouth daily. 90 tablet 4  . metoprolol succinate (TOPROL-XL)  50 MG 24 hr tablet Take 1 tablet (50 mg total) by mouth daily. Take with or immediately following a meal. 30 tablet 0  . mycophenolate (CELLCEPT) 250 MG capsule Take 2 capsules (500 mg total) by mouth 2 (two) times daily. 60 capsule 0  . torsemide (DEMADEX) 20 MG tablet Take 2 tablets (40 mg total) by mouth 2 (two) times daily. 60 tablet 0  . hydrochlorothiazide (HYDRODIURIL) 25 MG tablet Take 25 mg by mouth daily.    Marland Kitchen KLOR-CON M20 20 MEQ tablet TAKE 1 TABLET (20 MEQ TOTAL) BY MOUTH DAILY. (FIRST 5 DAYS TAKE TWICE DAILY) 100 tablet 0  . hydroxychloroquine (PLAQUENIL) 200 MG tablet Take 1 tablet (200 mg total) by mouth daily.    Marland Kitchen losartan (COZAAR) 50 MG tablet Take 1 tablet by mouth daily.    . Magnesium 400 MG CAPS Take 400 mg by mouth daily. 30 capsule 0  . predniSONE (DELTASONE) 5 MG tablet Take 5 mg by mouth daily with breakfast.     No facility-administered medications prior to visit.      Per HPI unless specifically indicated in ROS section below Review of Systems Objective:    BP (!) 148/84 (BP Location: Right Arm, Cuff Size: Large)   Pulse 70   Temp 99 F (37.2 C) (Oral)   Ht 4\' 9"  (1.448 m)   Wt 156 lb 3 oz (70.8 kg)   LMP  03/04/2009 (Approximate)   SpO2 100%   BMI 33.80 kg/m   Wt Readings from Last 3 Encounters:  12/19/18 156 lb 3 oz (70.8 kg)  08/30/18 145 lb (65.8 kg)  07/08/18 145 lb (65.8 kg)    Physical Exam Vitals signs and nursing note reviewed.  Constitutional:      Appearance: Normal appearance. She is not ill-appearing.  HENT:     Mouth/Throat:     Mouth: Mucous membranes are moist.  Eyes:     Pupils: Pupils are equal, round, and reactive to light.     Comments: Evidence of cataracts bilaterally  Cardiovascular:     Rate and Rhythm: Normal rate and regular rhythm.     Pulses: Normal pulses.     Heart sounds: Normal heart sounds. No murmur.  Pulmonary:     Effort: Pulmonary effort is normal. No respiratory distress.     Breath sounds: Normal breath  sounds. No wheezing, rhonchi or rales.  Musculoskeletal:     Right lower leg: No edema.     Left lower leg: No edema.  Neurological:     Mental Status: She is alert.       Results for orders placed or performed in visit on 08/30/18  Cytology - PAP  Result Value Ref Range   Adequacy      Satisfactory for evaluation  endocervical/transformation zone component PRESENT.   Diagnosis      NEGATIVE FOR INTRAEPITHELIAL LESIONS OR MALIGNANCY.   HPV NOT DETECTED    Material Submitted CervicoVaginal Pap [ThinPrep Imaged]    CYTOLOGY - PAP PAP RESULT    Lab Results  Component Value Date   CREATININE 1.88 (H) 06/16/2018   BUN 64 (H) 06/16/2018   NA 140 06/16/2018   K 4.0 06/16/2018   CL 104 06/16/2018   CO2 29 06/16/2018    Assessment & Plan:   Problem List Items Addressed This Visit    Systemic lupus erythematosus ([place])    Now offf prednisone. Appreciate rheum care.       Lupus nephritis, ISN/RPS class V [place])    Appreciate nephrology care.       Essential hypertension - Primary    Chronic, mildly elevated today.  Discussed home regimen - she is unsure if she's taking hctz at home. rec check at home, discontinue if she is (as already on torsemide). rec retrial of losartan 25mg  daily. Has f/u with renal in 3 wks - reassess BP control then.       Relevant Medications   losartan (COZAAR) 50 MG tablet   Chronic HFrEF (heart failure with reduced ejection fraction) ([place])    Seems euvolemic today.       Relevant Medications   losartan (COZAAR) 50 MG tablet    Other Visit Diagnoses    Special screening for malignant neoplasms, colon       Relevant Orders   Ambulatory referral to Gastroenterology       No orders of the defined types were placed in this encounter.  Orders Placed This Encounter  Procedures  . Ambulatory referral to Gastroenterology    Referral Priority:   Routine    Referral Type:   Consultation    Referral Reason:   Specialty Services Required    Number  of Visits Requested:   1    Follow up plan: Return in about 6 months (around 06/21/2019) for annual exam, prior fasting for blood work.  Ria Bush, MD

## 2018-12-19 NOTE — Assessment & Plan Note (Signed)
Now offf prednisone. Appreciate rheum care.

## 2018-12-19 NOTE — Assessment & Plan Note (Signed)
Seems euvolemic today.  
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
## 2018-12-28 ENCOUNTER — Encounter: Payer: Self-pay | Admitting: Pharmacist

## 2018-12-28 ENCOUNTER — Ambulatory Visit: Payer: 59 | Attending: Family | Admitting: Family

## 2018-12-28 ENCOUNTER — Encounter: Payer: Self-pay | Admitting: Family

## 2018-12-28 ENCOUNTER — Other Ambulatory Visit: Payer: Self-pay

## 2018-12-28 VITALS — BP 144/80 | HR 80 | Resp 18 | Ht 59.0 in | Wt 158.2 lb

## 2018-12-28 DIAGNOSIS — E559 Vitamin D deficiency, unspecified: Secondary | ICD-10-CM | POA: Insufficient documentation

## 2018-12-28 DIAGNOSIS — Z833 Family history of diabetes mellitus: Secondary | ICD-10-CM | POA: Insufficient documentation

## 2018-12-28 DIAGNOSIS — N189 Chronic kidney disease, unspecified: Secondary | ICD-10-CM | POA: Insufficient documentation

## 2018-12-28 DIAGNOSIS — Z79811 Long term (current) use of aromatase inhibitors: Secondary | ICD-10-CM | POA: Diagnosis not present

## 2018-12-28 DIAGNOSIS — E669 Obesity, unspecified: Secondary | ICD-10-CM | POA: Diagnosis not present

## 2018-12-28 DIAGNOSIS — M329 Systemic lupus erythematosus, unspecified: Secondary | ICD-10-CM | POA: Diagnosis not present

## 2018-12-28 DIAGNOSIS — I10 Essential (primary) hypertension: Secondary | ICD-10-CM

## 2018-12-28 DIAGNOSIS — I130 Hypertensive heart and chronic kidney disease with heart failure and stage 1 through stage 4 chronic kidney disease, or unspecified chronic kidney disease: Secondary | ICD-10-CM | POA: Insufficient documentation

## 2018-12-28 DIAGNOSIS — I5022 Chronic systolic (congestive) heart failure: Secondary | ICD-10-CM | POA: Diagnosis not present

## 2018-12-28 DIAGNOSIS — Z853 Personal history of malignant neoplasm of breast: Secondary | ICD-10-CM | POA: Diagnosis not present

## 2018-12-28 DIAGNOSIS — Z9221 Personal history of antineoplastic chemotherapy: Secondary | ICD-10-CM | POA: Insufficient documentation

## 2018-12-28 DIAGNOSIS — Z923 Personal history of irradiation: Secondary | ICD-10-CM | POA: Diagnosis not present

## 2018-12-28 DIAGNOSIS — I509 Heart failure, unspecified: Secondary | ICD-10-CM | POA: Diagnosis present

## 2018-12-28 DIAGNOSIS — Z79899 Other long term (current) drug therapy: Secondary | ICD-10-CM | POA: Insufficient documentation

## 2018-12-28 DIAGNOSIS — Z6831 Body mass index (BMI) 31.0-31.9, adult: Secondary | ICD-10-CM | POA: Diagnosis not present

## 2018-12-28 NOTE — Patient Instructions (Signed)
Continue weighing daily and call for an overnight weight gain of > 2 pounds or a weekly weight gain of >5 pounds. 
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
## 2018-12-29 ENCOUNTER — Telehealth: Payer: Self-pay | Admitting: Family

## 2018-12-29 NOTE — Telephone Encounter (Signed)
Heard back from nephrologist [facility]) who thought changing her losartan to entresto would be a good idea. He said if we started it, he would follow her labs.  Spoke with patient about what he said and explained that we had a 30 day voucher that she could come by and pick up so that we could start entresto. She says that she would like to speak with Dr. Juleen China in person first at her next appointment on 01/02/2019.   Advised her that if after talking with him, she could stop by our office afterwards and pick up the [employer]. If she starts the entresto, she was reminded that she would stop her losartan. Patient verbalized understanding.

## 2019-01-02 DIAGNOSIS — R319 Hematuria, unspecified: Secondary | ICD-10-CM | POA: Diagnosis not present

## 2019-01-02 DIAGNOSIS — M329 Systemic lupus erythematosus, unspecified: Secondary | ICD-10-CM | POA: Diagnosis not present

## 2019-01-02 DIAGNOSIS — N183 Chronic kidney disease, stage 3 (moderate): Secondary | ICD-10-CM | POA: Diagnosis not present

## 2019-01-02 DIAGNOSIS — R809 Proteinuria, unspecified: Secondary | ICD-10-CM | POA: Diagnosis not present

## 2019-01-02 DIAGNOSIS — I5032 Chronic diastolic (congestive) heart failure: Secondary | ICD-10-CM | POA: Diagnosis not present

## 2019-01-02 DIAGNOSIS — M3214 Glomerular disease in systemic lupus erythematosus: Secondary | ICD-10-CM | POA: Diagnosis not present

## 2019-01-02 DIAGNOSIS — I129 Hypertensive chronic kidney disease with stage 1 through stage 4 chronic kidney disease, or unspecified chronic kidney disease: Secondary | ICD-10-CM | POA: Diagnosis not present

## 2019-01-03 ENCOUNTER — Other Ambulatory Visit: Payer: Self-pay | Admitting: Family

## 2019-01-03 MED ORDER — SACUBITRIL-VALSARTAN 24-26 MG PO TABS: 1.0000 | ORAL_TABLET | Freq: Two times a day (BID) | ORAL | 3 refills | Status: DC

## 2019-01-16 DIAGNOSIS — R319 Hematuria, unspecified: Secondary | ICD-10-CM | POA: Diagnosis not present

## 2019-01-16 DIAGNOSIS — R768 Other specified abnormal immunological findings in serum: Secondary | ICD-10-CM | POA: Diagnosis not present

## 2019-01-16 DIAGNOSIS — R809 Proteinuria, unspecified: Secondary | ICD-10-CM | POA: Diagnosis not present

## 2019-01-16 LAB — BASIC METABOLIC PANEL
BUN: 52 — AB (ref 4–21)
CREATININE: 1.6 — AB (ref 0.5–1.1)
Potassium: 3.3 — AB (ref 3.4–5.3)

## 2019-01-17 ENCOUNTER — Encounter: Payer: Self-pay | Admitting: Family Medicine

## 2019-01-30 ENCOUNTER — Encounter: Payer: Self-pay | Admitting: *Deleted

## 2019-02-23 ENCOUNTER — Other Ambulatory Visit: Payer: Self-pay | Admitting: Family

## 2019-02-28 ENCOUNTER — Ambulatory Visit (INDEPENDENT_AMBULATORY_CARE_PROVIDER_SITE_OTHER): Payer: 59 | Admitting: Family Medicine

## 2019-02-28 ENCOUNTER — Other Ambulatory Visit: Payer: Self-pay

## 2019-02-28 ENCOUNTER — Encounter: Payer: Self-pay | Admitting: Family Medicine

## 2019-02-28 ENCOUNTER — Ambulatory Visit (INDEPENDENT_AMBULATORY_CARE_PROVIDER_SITE_OTHER)
Admission: RE | Admit: 2019-02-28 | Discharge: 2019-02-28 | Disposition: A | Discharge status: Home / Self Care | Payer: 59 | Source: Ambulatory Visit | Attending: Family Medicine | Admitting: Family Medicine

## 2019-02-28 VITALS — BP 134/80 | HR 78 | Temp 98.4°F | Ht <= 58 in | Wt 157.1 lb

## 2019-02-28 DIAGNOSIS — M25572 Pain in left ankle and joints of left foot: Secondary | ICD-10-CM | POA: Diagnosis not present

## 2019-02-28 DIAGNOSIS — G8929 Other chronic pain: Secondary | ICD-10-CM | POA: Insufficient documentation

## 2019-02-28 NOTE — Patient Instructions (Signed)
I think you have lateral ankle sprain. Continue ice and tylenol. Use ankle brace provided today. Keep foot elevated.  Xray of ankle today.  If not improving with treatment, let us know for ortho referral.

## 2019-02-28 NOTE — Progress Notes (Signed)
This visit was conducted in person.  BP 134/80 (BP Location: Right Arm, Patient Position: Sitting, Cuff Size: Normal)   Pulse 78   Temp 98.4 F (36.9 C) (Oral)   Ht 4\' 9"  (1.448 m)   Wt 157 lb 2 oz (71.3 kg)   LMP 03/04/2009 (Approximate)   SpO2 100%   BMI 34.00 kg/m    CC: L foot pain Subjective:    Patient ID: Melanie Holloway, female    DOB: 01-08-1966, 53 y.o.   MRN: [id number]  HPI: JONNELLE LAWNICZAK is a 53 y.o. female presenting on 02/28/2019 for Foot Pain (C/o left foot pain and swelling. Started about 3 wks ago. Tried ice, helpful. )   3 wk h/o L foot pain and swelling - points to heel and lateral ankle.  Ice and tylenol helps.  No redness or warmth. No calf or knee pain/swelling.  Denies inciting trauma/injury. May have started after walking on gravel rocks [place] her [place] 3 wks ago.   No h/o gout.   H/o remote L broken ankle s/p surgery with hardware     Relevant past medical, surgical, family and social history reviewed and updated as indicated. Interim medical history since our last visit reviewed. Allergies and medications reviewed and updated. Outpatient Medications Prior to Visit  Medication Sig Dispense Refill  . acetaminophen (TYLENOL) 325 MG tablet Take 2 tablets (650 mg total) by mouth every 4 (four) hours as needed for headache or mild pain. 30 tablet 0  . Cholecalciferol (VITAMIN D) 2000 UNITS CAPS Take 1 capsule (2,000 Units total) by mouth daily. 30 capsule   . clobetasol cream (TEMOVATE) 5.80 % Apply 1 application topically 2 (two) times daily.    Marland Kitchen ENTRESTO 24-26 MG TAKE 1 TABLET BY MOUTH TWICE A DAY 180 tablet 3  . hydroxychloroquine (PLAQUENIL) 200 MG tablet Take 1 tablet (200 mg total) by mouth daily.    Marland Kitchen letrozole (FEMARA) 2.5 MG tablet Take 1 tablet (2.5 mg total) by mouth daily. 90 tablet 4  . metoprolol succinate (TOPROL-XL) 50 MG 24 hr tablet Take 1 tablet (50 mg total) by mouth daily. Take with or immediately following a meal. 30 tablet 0  .  mycophenolate (CELLCEPT) 250 MG capsule Take 2 capsules (500 mg total) by mouth 2 (two) times daily. 60 capsule 0  . torsemide (DEMADEX) 20 MG tablet Take 2 tablets (40 mg total) by mouth 2 (two) times daily. 60 tablet 0   No facility-administered medications prior to visit.      Per HPI unless specifically indicated in ROS section below Review of Systems Objective:    BP 134/80 (BP Location: Right Arm, Patient Position: Sitting, Cuff Size: Normal)   Pulse 78   Temp 98.4 F (36.9 C) (Oral)   Ht 4\' 9"  (1.448 m)   Wt 157 lb 2 oz (71.3 kg)   LMP 03/04/2009 (Approximate)   SpO2 100%   BMI 34.00 kg/m   Wt Readings from Last 3 Encounters:  02/28/19 157 lb 2 oz (71.3 kg)  12/28/18 158 lb 4 oz (71.8 kg)  12/19/18 156 lb 3 oz (70.8 kg)    Physical Exam Vitals signs and nursing note reviewed.  Constitutional:      General: She is not in acute distress.    Appearance: Normal appearance. She is not ill-appearing.  Musculoskeletal:        General: Swelling and tenderness present. No deformity.     Comments: 1+ DP bilaterally FROM R ankle without pain  Limited ROM at L ankle (chronic) Swelling lateral > medial L ankle as well as lower leg (1+ nonpitting) Markedly tender to palpation along lateral ankle ligament No pain at achilles or at plantar fascia or at base of 5th MT Palpable hardware inferior lateral malleolus  Skin:    General: Skin is warm and dry.     Capillary Refill: Capillary refill takes less than 2 seconds.     Findings: No erythema or rash.  Neurological:     Mental Status: She is alert.  Psychiatric:        Mood and Affect: Mood normal.       Results for orders placed or performed in visit on 16/10/96  Basic metabolic panel  Result Value Ref Range   BUN 52 (A) 4 - 21   Creatinine 1.6 (A) 0.5 - 1.1   Potassium 3.3 (A) 3.4 - 5.3   Assessment & Plan:   Problem List Items Addressed This Visit    Acute left ankle pain - Primary    Exam most consistent with  lateral ankle sprain however does not recall inciting trauma/mechanism of injury. In h/o remote ankle fracture with hardware, will check ankle films to evaluate. ==> marked degenerative changes at L ankle with large rod and 2 screws, I don't see obvious acute change but will await radiology read. Place in ASO brace, supportive care reviewed. Continue ice, tylenol. Avoid NSAID in kidney disease.  If not improving with treatment, consider ortho eval.  Not consistent with DVT, plantar fasciitis, septic joint, gout, or achilles tendonitis.       Relevant Orders   DG Ankle Complete Left       No orders of the defined types were placed in this encounter.  Orders Placed This Encounter  Procedures  . DG Ankle Complete Left    Standing Status:   Future    Number of Occurrences:   1    Standing Expiration Date:   04/29/2020    Order Specific Question:   Reason for Exam (SYMPTOM  OR DIAGNOSIS REQUIRED)    Answer:   L lateral ankle pain, h/o hardware    Order Specific Question:   Is patient pregnant?    Answer:   No    Order Specific Question:   Preferred imaging location?    Answer:   [facility]    Order Specific Question:   Radiology Contrast Protocol - do NOT remove file path    Answer:   [email or web address]    Patient Instructions  I think you have lateral ankle sprain. Continue ice and tylenol. Use ankle brace provided today. Keep foot elevated.  Xray of ankle today.  If not improving with treatment, let us know for ortho referral.     Follow up plan: Return if symptoms worsen or fail to improve.  Ria Bush, MD

## 2019-02-28 NOTE — Assessment & Plan Note (Addendum)
Exam most consistent with lateral ankle sprain however does not recall inciting trauma/mechanism of injury. In h/o remote ankle fracture with hardware, will check ankle films to evaluate. ==> marked degenerative changes at L ankle with large rod and 2 screws, I don't see obvious acute change but will await radiology read. Place in ASO brace, supportive care reviewed. Continue ice, tylenol. Avoid NSAID in kidney disease.  If not improving with treatment, consider ortho eval.  Not consistent with DVT, plantar fasciitis, septic joint, gout, or achilles tendonitis.

## 2019-03-15 DIAGNOSIS — Z79899 Other long term (current) drug therapy: Secondary | ICD-10-CM | POA: Diagnosis not present

## 2019-03-15 DIAGNOSIS — H35033 Hypertensive retinopathy, bilateral: Secondary | ICD-10-CM | POA: Diagnosis not present

## 2019-03-15 DIAGNOSIS — M329 Systemic lupus erythematosus, unspecified: Secondary | ICD-10-CM | POA: Diagnosis not present

## 2019-04-04 ENCOUNTER — Telehealth: Payer: Self-pay | Admitting: *Deleted

## 2019-04-04 NOTE — Telephone Encounter (Signed)
This RN spoke with pt per call stating she was notified by the hospital that she would need to pay $1500 for the procedure ( bone scan ) scheduled this Thursday.  She thought she spoke to the " pre authorization center " but really wasn't sure of the department.  This RN did ask pt if she knew what her expected out of pocket deductible is - which Melanie Holloway did not know as well as she states " they changed our insurance recently "- but did not have this information available.  This RN saw last insurance card was entered from November 2019.  This RN sent an inbox message to Elliot Gault per noted prior auth requesting follow up and pt contact per above.

## 2019-04-06 ENCOUNTER — Encounter (HOSPITAL_COMMUNITY)
Admission: RE | Admit: 2019-04-06 | Discharge: 2019-04-06 | Disposition: A | Discharge status: Home / Self Care | Payer: 59 | Source: Ambulatory Visit | Attending: Oncology | Admitting: Oncology

## 2019-04-06 ENCOUNTER — Other Ambulatory Visit: Payer: Self-pay

## 2019-04-06 DIAGNOSIS — C7951 Secondary malignant neoplasm of bone: Secondary | ICD-10-CM

## 2019-04-06 DIAGNOSIS — C50512 Malignant neoplasm of lower-outer quadrant of left female breast: Secondary | ICD-10-CM | POA: Insufficient documentation

## 2019-04-06 DIAGNOSIS — Z170 Estrogen receptor positive status [ER+]: Secondary | ICD-10-CM | POA: Diagnosis present

## 2019-04-06 DIAGNOSIS — Z79811 Long term (current) use of aromatase inhibitors: Secondary | ICD-10-CM | POA: Insufficient documentation

## 2019-04-06 DIAGNOSIS — E2839 Other primary ovarian failure: Secondary | ICD-10-CM | POA: Diagnosis present

## 2019-04-06 DIAGNOSIS — Z780 Asymptomatic menopausal state: Secondary | ICD-10-CM | POA: Insufficient documentation

## 2019-04-06 DIAGNOSIS — M320 Drug-induced systemic lupus erythematosus: Secondary | ICD-10-CM | POA: Diagnosis present

## 2019-04-06 MED ORDER — TECHNETIUM TC 99M MEDRONATE IV KIT
18.0000 | PACK | Freq: Once | INTRAVENOUS | Status: AC | PRN
  Administered 2019-04-06: 18 via INTRAVENOUS

## 2019-04-10 ENCOUNTER — Other Ambulatory Visit: Payer: Self-pay | Admitting: Oncology

## 2019-04-10 DIAGNOSIS — Z170 Estrogen receptor positive status [ER+]: Secondary | ICD-10-CM

## 2019-04-10 DIAGNOSIS — C50512 Malignant neoplasm of lower-outer quadrant of left female breast: Secondary | ICD-10-CM

## 2019-04-10 NOTE — Progress Notes (Unsigned)
Melanie Holloway's repeat bone scan shows evidence of progression.  I have requested information from our research nurses whether she might qualify for the upcoming fulvestrant/CAP study.  If so we probably will need to obtain bone biopsy and in any case we may wish to do that since we do not have biopsy-proven stage IV disease.  She will likely also need a CT scan of the chest.  I will discuss all this with her when she returns to see me 6/25.  I have also added a CA-27-29 to that visit.

## 2019-04-11 ENCOUNTER — Telehealth: Payer: Self-pay | Admitting: Oncology

## 2019-04-11 NOTE — Telephone Encounter (Signed)
Added lab to 6/25 f/u. Confirmed with patient.
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
## 2019-04-13 ENCOUNTER — Other Ambulatory Visit: Payer: Self-pay

## 2019-04-13 ENCOUNTER — Inpatient Hospital Stay: Payer: 59 | Attending: Oncology | Admitting: Oncology

## 2019-04-13 ENCOUNTER — Inpatient Hospital Stay: Payer: 59

## 2019-04-13 VITALS — BP 146/81 | HR 83 | Temp 97.7°F | Resp 16 | Ht <= 58 in | Wt 160.6 lb

## 2019-04-13 DIAGNOSIS — C771 Secondary and unspecified malignant neoplasm of intrathoracic lymph nodes: Secondary | ICD-10-CM

## 2019-04-13 DIAGNOSIS — R05 Cough: Secondary | ICD-10-CM | POA: Insufficient documentation

## 2019-04-13 DIAGNOSIS — Z803 Family history of malignant neoplasm of breast: Secondary | ICD-10-CM | POA: Insufficient documentation

## 2019-04-13 DIAGNOSIS — C50512 Malignant neoplasm of lower-outer quadrant of left female breast: Secondary | ICD-10-CM

## 2019-04-13 DIAGNOSIS — Z9221 Personal history of antineoplastic chemotherapy: Secondary | ICD-10-CM | POA: Diagnosis not present

## 2019-04-13 DIAGNOSIS — Z923 Personal history of irradiation: Secondary | ICD-10-CM | POA: Insufficient documentation

## 2019-04-13 DIAGNOSIS — S93402A Sprain of unspecified ligament of left ankle, initial encounter: Secondary | ICD-10-CM | POA: Insufficient documentation

## 2019-04-13 DIAGNOSIS — C7951 Secondary malignant neoplasm of bone: Secondary | ICD-10-CM | POA: Diagnosis not present

## 2019-04-13 DIAGNOSIS — M3214 Glomerular disease in systemic lupus erythematosus: Secondary | ICD-10-CM | POA: Diagnosis not present

## 2019-04-13 DIAGNOSIS — Z79899 Other long term (current) drug therapy: Secondary | ICD-10-CM | POA: Diagnosis not present

## 2019-04-13 DIAGNOSIS — I509 Heart failure, unspecified: Secondary | ICD-10-CM | POA: Insufficient documentation

## 2019-04-13 DIAGNOSIS — Z79810 Long term (current) use of selective estrogen receptor modulators (SERMs): Secondary | ICD-10-CM | POA: Diagnosis not present

## 2019-04-13 DIAGNOSIS — D649 Anemia, unspecified: Secondary | ICD-10-CM | POA: Diagnosis not present

## 2019-04-13 DIAGNOSIS — D61818 Other pancytopenia: Secondary | ICD-10-CM | POA: Insufficient documentation

## 2019-04-13 DIAGNOSIS — Z9012 Acquired absence of left breast and nipple: Secondary | ICD-10-CM | POA: Diagnosis not present

## 2019-04-13 DIAGNOSIS — Z833 Family history of diabetes mellitus: Secondary | ICD-10-CM | POA: Insufficient documentation

## 2019-04-13 DIAGNOSIS — Z170 Estrogen receptor positive status [ER+]: Secondary | ICD-10-CM | POA: Insufficient documentation

## 2019-04-13 LAB — CBC WITH DIFFERENTIAL/PLATELET
Abs Immature Granulocytes: 0.01 10*3/uL (ref 0.00–0.07)
Basophils Absolute: 0 10*3/uL (ref 0.0–0.1)
Basophils Relative: 0 %
Eosinophils Absolute: 0.1 10*3/uL (ref 0.0–0.5)
Eosinophils Relative: 3 %
HCT: 26 % — ABNORMAL LOW (ref 36.0–46.0)
Hemoglobin: 8.5 g/dL — ABNORMAL LOW (ref 12.0–15.0)
Immature Granulocytes: 0 %
Lymphocytes Relative: 25 %
Lymphs Abs: 0.7 10*3/uL (ref 0.7–4.0)
MCH: 28.9 pg (ref 26.0–34.0)
MCHC: 32.7 g/dL (ref 30.0–36.0)
MCV: 88.4 fL (ref 80.0–100.0)
Monocytes Absolute: 0.3 10*3/uL (ref 0.1–1.0)
Monocytes Relative: 11 %
Neutro Abs: 1.6 10*3/uL — ABNORMAL LOW (ref 1.7–7.7)
Neutrophils Relative %: 61 %
Platelets: 163 10*3/uL (ref 150–400)
RBC: 2.94 MIL/uL — ABNORMAL LOW (ref 3.87–5.11)
RDW: 12.5 % (ref 11.5–15.5)
WBC: 2.6 10*3/uL — ABNORMAL LOW (ref 4.0–10.5)
nRBC: 0 % (ref 0.0–0.2)

## 2019-04-13 LAB — COMPREHENSIVE METABOLIC PANEL
ALT: 13 U/L (ref 0–44)
AST: 17 U/L (ref 15–41)
Albumin: 3.5 g/dL (ref 3.5–5.0)
Alkaline Phosphatase: 111 U/L (ref 38–126)
Anion gap: 11 (ref 5–15)
BUN: 52 mg/dL — ABNORMAL HIGH (ref 6–20)
CO2: 26 mmol/L (ref 22–32)
Calcium: 8.7 mg/dL — ABNORMAL LOW (ref 8.9–10.3)
Chloride: 104 mmol/L (ref 98–111)
Creatinine, Ser: 2.2 mg/dL — ABNORMAL HIGH (ref 0.44–1.00)
GFR calc Af Amer: 29 mL/min — ABNORMAL LOW (ref >60)
GFR calc non Af Amer: 25 mL/min — ABNORMAL LOW (ref >60)
Glucose, Bld: 89 mg/dL (ref 70–99)
Potassium: 3.5 mmol/L (ref 3.5–5.1)
Sodium: 141 mmol/L (ref 135–145)
Total Bilirubin: 0.2 mg/dL — ABNORMAL LOW (ref 0.3–1.2)
Total Protein: 7.2 g/dL (ref 6.5–8.1)

## 2019-04-13 MED ORDER — ALENDRONATE SODIUM 70 MG PO TABS: 70.0000 mg | ORAL_TABLET | ORAL | 6 refills | Status: DC

## 2019-04-14 ENCOUNTER — Telehealth: Payer: Self-pay | Admitting: Oncology

## 2019-04-14 ENCOUNTER — Telehealth: Payer: Self-pay | Admitting: Pharmacist

## 2019-04-14 ENCOUNTER — Telehealth: Payer: Self-pay

## 2019-04-14 LAB — CANCER ANTIGEN 27.29: CA 27.29: 54.2 U/mL — ABNORMAL HIGH (ref 0.0–38.6)

## 2019-04-14 NOTE — Telephone Encounter (Signed)
Oral Oncology Pharmacist Encounter  Received new referral for Ibrance (palbociclib) for the treatment of metastatic, hormone receptor positive, Her-2 receptor negative breast cancer in conjunction with letrozole, planned duration until disease progression or unacceptable toxicity.  Patient is not planned to start Ibrance until discussion at next office visit planned for 05/17/19 Patient with history of lupus and is on immunosuppressive agents. Leslee Home will be initiated at low dose to preserve counts Prescription will be written after further discussion between MD and patient.  Labs from 04/13/2019 assessed, OK for Ibrance initiation. Noted mild leukopenia and anemia, plan for Ibrance dose reduction at initiation as above Noted SCr elevated to 2.2, est CrCl ~ 30 mL/min Baseline SCr appears to range 1.6-3.0 No dose adjustment per manufacturer is renal impairment  Current medication list in Epic reviewed, no DDIs with Leslee Home identified:  Insurance authorization will be obtained. We will await further direction from MD on start date and appropriate timing of initial counseling.  Johny Drilling, PharmD, BCPS, BCOP  04/14/2019 10:44 AM Oral Oncology Clinic [phone]

## 2019-04-14 NOTE — Telephone Encounter (Signed)
I talk with patient regarding schedule  

## 2019-04-14 NOTE — Telephone Encounter (Signed)
Oral Oncology Patient Advocate Encounter  Prior Authorization for Leslee Home has been approved.    PA# [id number] Effective dates: 04/14/19 through 04/13/20  Oral Oncology Clinic will continue to follow.   [place] Patient [place] Phone [phone] Fax [phone] 04/14/2019    4:03 PM

## 2019-04-14 NOTE — Addendum Note (Signed)
Addended by: Chauncey Cruel on: 04/14/2019 09:05 AM   Modules accepted: Orders

## 2019-04-14 NOTE — Telephone Encounter (Signed)
Oral Oncology Patient Advocate Encounter  Received notification from [employer] that prior authorization for Ibrance tablet is required.  PA submitted on CoverMyMeds Key [id number] Status is pending  Oral Oncology Clinic will continue to follow.  [place] Patient [place] Phone [phone] Fax [phone] 04/14/2019    2:13 PM

## 2019-04-20 ENCOUNTER — Ambulatory Visit (HOSPITAL_COMMUNITY)
Admission: RE | Admit: 2019-04-20 | Discharge: 2019-04-20 | Disposition: A | Discharge status: Home / Self Care | Payer: 59 | Source: Ambulatory Visit | Attending: Oncology | Admitting: Oncology

## 2019-04-20 ENCOUNTER — Other Ambulatory Visit: Payer: Self-pay

## 2019-04-20 DIAGNOSIS — C7951 Secondary malignant neoplasm of bone: Secondary | ICD-10-CM | POA: Insufficient documentation

## 2019-04-20 DIAGNOSIS — Z170 Estrogen receptor positive status [ER+]: Secondary | ICD-10-CM | POA: Diagnosis present

## 2019-04-20 DIAGNOSIS — C771 Secondary and unspecified malignant neoplasm of intrathoracic lymph nodes: Secondary | ICD-10-CM | POA: Diagnosis present

## 2019-04-20 DIAGNOSIS — C50512 Malignant neoplasm of lower-outer quadrant of left female breast: Secondary | ICD-10-CM | POA: Insufficient documentation

## 2019-04-20 DIAGNOSIS — D61818 Other pancytopenia: Secondary | ICD-10-CM | POA: Diagnosis present

## 2019-04-20 DIAGNOSIS — M3214 Glomerular disease in systemic lupus erythematosus: Secondary | ICD-10-CM

## 2019-04-24 ENCOUNTER — Other Ambulatory Visit: Payer: Self-pay | Admitting: Oncology

## 2019-04-24 NOTE — Progress Notes (Signed)
I called Melanie Holloway and gave her the results of her chest CT scan which showed no obvious visceral disease.  There are some very small, less than 5 mm pulmonary nodules which will need to be followed.  She will start on Ibrance when she returns to see me later this month.

## 2019-05-14 IMAGING — CT CT ABD-PELV W/ CM
2 of 5 series · 14 of 46 positions shown, 16 images · IV contrast (APPLIED)
Comparison: 05/24/2012

CLINICAL DATA: Left lower quadrant pain for several days with
vomiting, initial encounter

EXAM:
CT ABDOMEN AND PELVIS WITH CONTRAST
TECHNIQUE: Multidetector CT imaging of the abdomen and pelvis was performed
using the standard protocol following bolus administration of
intravenous contrast.
CONTRAST:  100mL TN1PZ7-[id number] IOPAMIDOL (TN1PZ7-NNN) INJECTION 61%

[Series 2: axial st · axial · 0.72mm/px · z∈[-159,+201]mm · 11 of 82 slices shown, 13 images]
[im 5/82  soft-tissue]
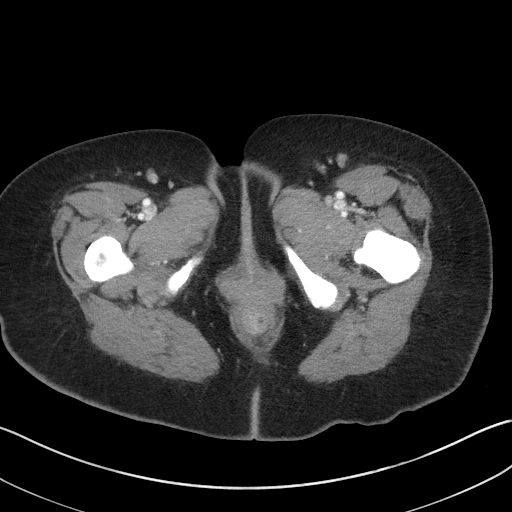
[im 5/82  bone]
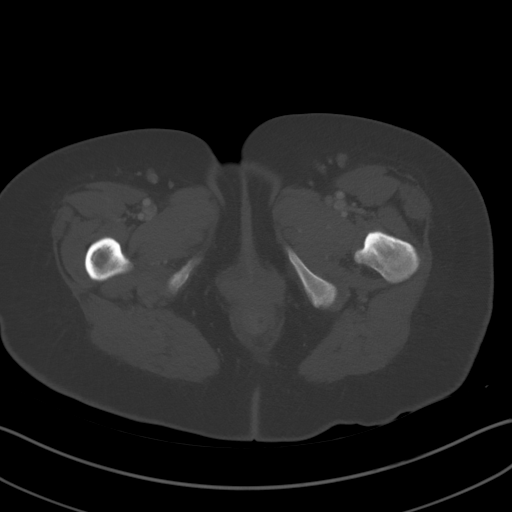
[im 13/82  soft-tissue]
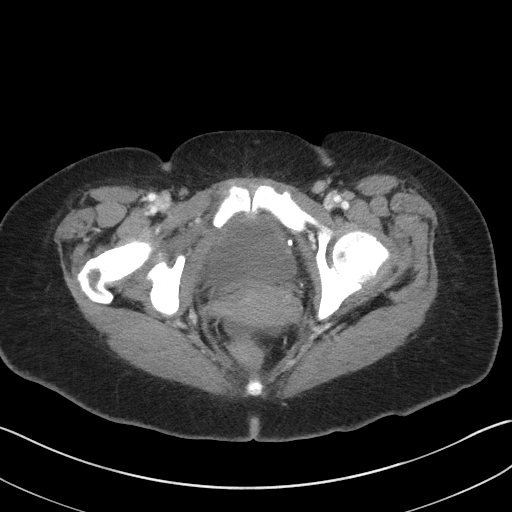
[im 22/82  soft-tissue]
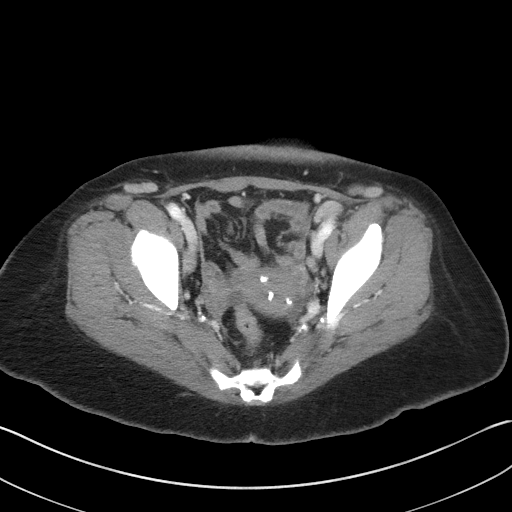
[im 26/82  soft-tissue]
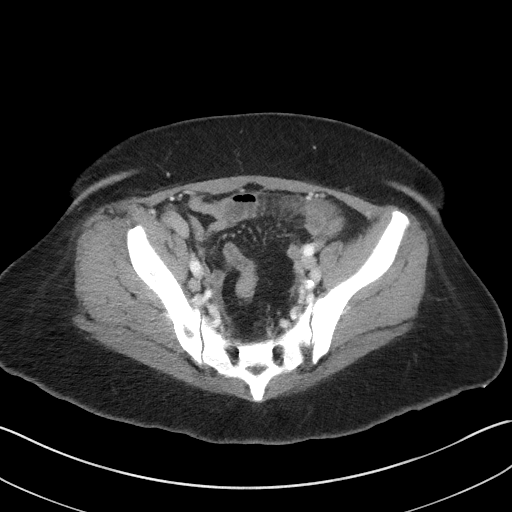
[im 35/82  soft-tissue]
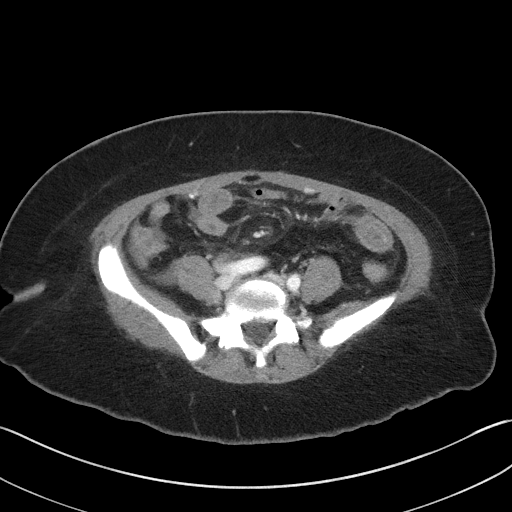
[im 43/82  soft-tissue]
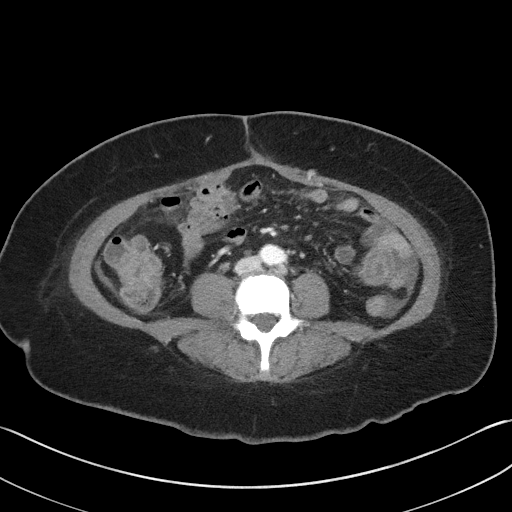
[im 47/82  soft-tissue]
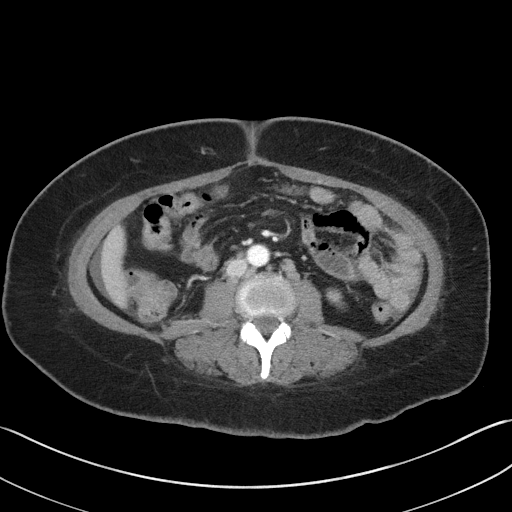
[im 56/82  soft-tissue]
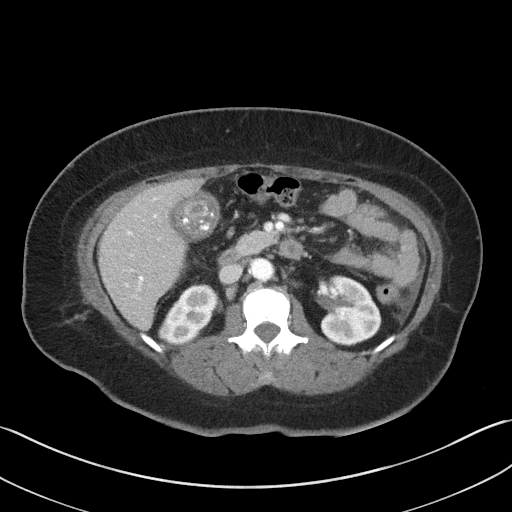
[im 60/82  soft-tissue]
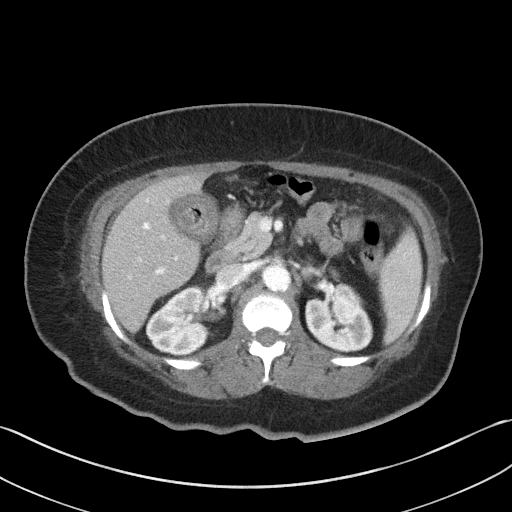
[im 60/82  bone]
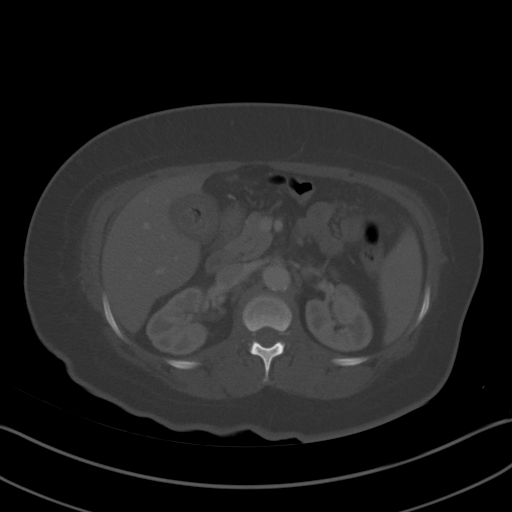
[im 69/82  soft-tissue]
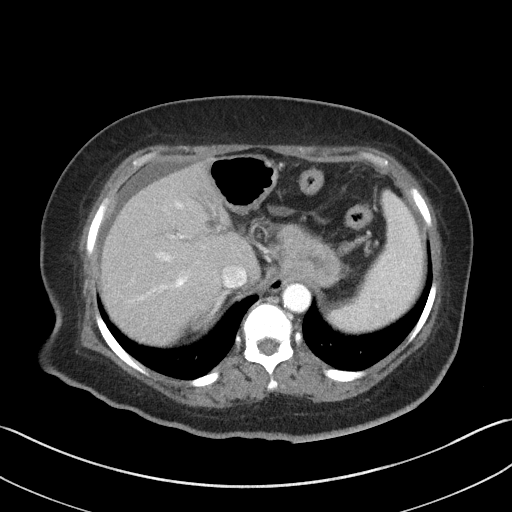
[im 77/82  soft-tissue]
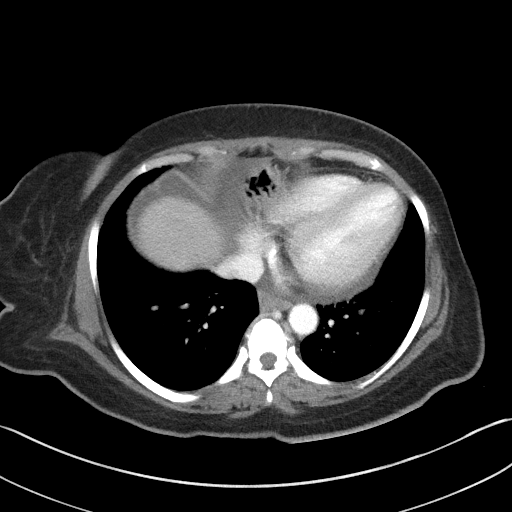

[Series 5: coronal st · coronal · 0.68mm/px · 3 of 81 slices shown]
[im 27/81  soft-tissue]
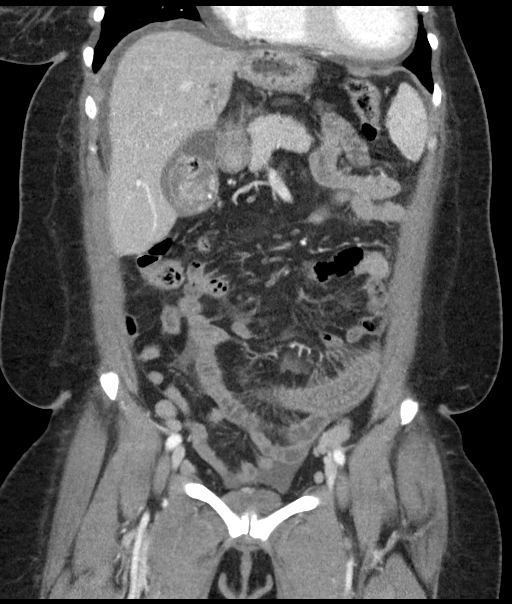
[im 36/81  soft-tissue]
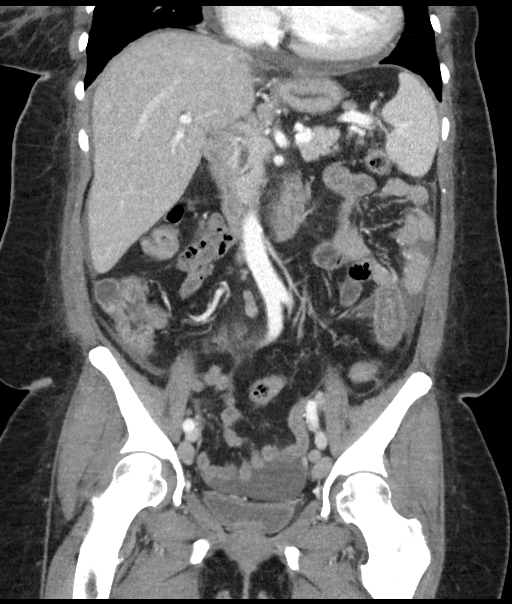
[im 45/81  soft-tissue]
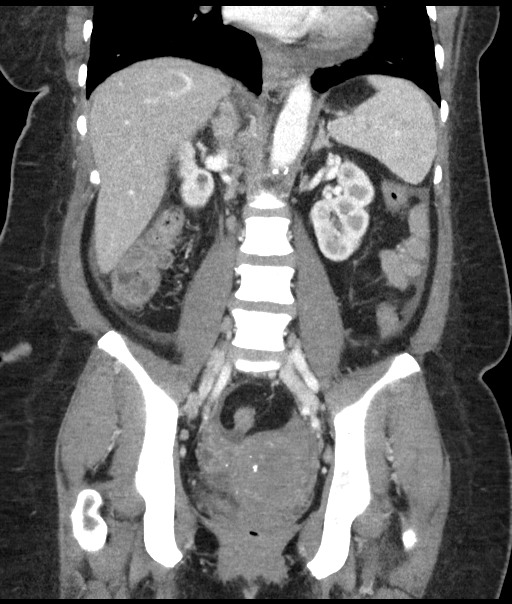

[14 of 46 positions shown; findings below may reference images not displayed]

FINDINGS: Lower chest: Small pericardial effusion is seen. The heart is mildly
prominent. The lung bases are free of acute infiltrate or sizable
effusion.

Hepatobiliary: The liver shows no focal mass lesion. Some minimal
ductal dilatation is seen although no definitive stone is noted.
These changes are similar to that seen on prior exam. The
gallbladder is well distended with multiple gallstones within. Mild
pericholecystic fluid is noted although this is likely related to
the underlying free within the abdomen. A small amount of
perihepatic fluid is noted as well.

Pancreas: Pancreas is within normal limits.

Spleen: Vague hypodensity is noted within medial aspect of the
spleen best seen on image number 15 of series 2. This persists on
the delayed images.

Adrenals/Urinary Tract: The adrenal glands are within normal limits.
Normal excretion of contrast is noted from both kidneys. No renal
calculi or obstructive changes are seen. The bladder is
decompressed.

Stomach/Bowel: The colon is within normal limits without
inflammatory or obstructive changes. The appendix is unremarkable.
There are a few prominent loops of jejunum identified in the left
mid abdomen with evidence of wall edema and hyperemia and engorged
venous drainage. Some mild surrounding free fluid is noted likely
reactive in nature. These changes are new from the prior exam. The
more distal ileum is within normal limits.

Vascular/Lymphatic: Scattered atherosclerotic changes are noted
without aneurysmal dilatation. There are persistent retroperitoneal
lymph nodes identified in the periaortic and intra- aortocaval
region when compared with the prior exam. The majority of these are
stable over the multiple years from the prior exam. There is however
one lymph node mass noted adjacent to the right common iliac vein
best seen on image number 46 of series 2 which has increased in
short axis from 11 to 15 mm when compared with the prior exam. This
is best visualized on image number 46 of series 2. Scattered
relatively symmetric bilateral inguinal lymph nodes are noted as
well.

Reproductive: The uterus demonstrates diffuse calcifications within
likely related to fibroid change. The uterus itself is not
significantly enlarged.

Other: Free pelvic fluid is noted again likely reactive in nature

Musculoskeletal: Mild degenerative change of the lumbar spine is
noted. A lucency is noted in the L2 vertebral body extending into
the pedicle on the right. This was not present on the prior exam.
Given the patient's clinical history of prior breast carcinoma the
possibility of metastatic disease could not be totally excluded.
IMPRESSION: Edematous and inflamed loops of jejunum identified in the left and
mid abdomen as described. Some venous engorgement is identified as
well as some reactive lymph nodes in the mesentery. No obstructing
mass is noted. This may simply represent a focal area of small bowel
enteritis although the possibility of a more aggressive inflammatory
bowel disorder could not be totally excluded.

Multiple gallstones with mild pericholecystic fluid likely reactive
in nature given the mild ascites.

Mild ascites likely reactive in nature to the small-bowel
abnormality.

Scattered retroperitoneal lymph nodes which are predominately stable
when compared with the prior CT examination. A lymph node mass along
the right common iliac vein is noted as described which has
increased somewhat in the interval from the prior exam. Given the
time frame this slight increase is of uncertain significance.

New lytic appearing lesion in the right half of the L2 vertebral
body extending into the pedicle. The possibility of metastatic
disease deserves consideration as this was not seen on the prior
exam. Further workup is recommended. Bone scan may be helpful.

Vague hypodensity within the medial aspect of the spleen which was
not present on the prior exam. This is of uncertain significance.
Ultrasound evaluation of the spleen on a nonemergent basis may be
helpful.

Small pericardial effusion.

## 2019-05-15 ENCOUNTER — Telehealth: Payer: Self-pay | Admitting: Family Medicine

## 2019-05-15 NOTE — Telephone Encounter (Signed)
Best number [phone] Pt called wanting lisa to call her regarding referral pt would not into detail what it was about

## 2019-05-15 NOTE — Telephone Encounter (Signed)
Returned pt's call.  Says her daughter is looking for a new PCP.  Wants to know if Dr. Darnell Level will take her on.

## 2019-05-15 NOTE — Telephone Encounter (Signed)
Sure it'd be my pleasure to take patient on.  Just give [place] her name so we can pass it on to the front office for scheduling.

## 2019-05-16 NOTE — Telephone Encounter (Addendum)
Spoke with pt relaying Dr. Synthia Innocent message.  Pt states her daughter's name is Evelene Croon, DOB 08/18/91 and phone # [phone].  Needs to schedule NP visit.
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
## 2019-05-17 ENCOUNTER — Inpatient Hospital Stay (HOSPITAL_BASED_OUTPATIENT_CLINIC_OR_DEPARTMENT_OTHER): Payer: 59 | Admitting: Oncology

## 2019-05-17 ENCOUNTER — Inpatient Hospital Stay: Payer: 59 | Attending: Oncology

## 2019-05-17 ENCOUNTER — Other Ambulatory Visit: Payer: Self-pay

## 2019-05-17 ENCOUNTER — Telehealth: Payer: Self-pay | Admitting: *Deleted

## 2019-05-17 ENCOUNTER — Ambulatory Visit: Payer: 59 | Admitting: Oncology

## 2019-05-17 VITALS — BP 129/71 | HR 85 | Temp 96.8°F | Resp 18 | Ht <= 58 in | Wt 162.4 lb

## 2019-05-17 DIAGNOSIS — R918 Other nonspecific abnormal finding of lung field: Secondary | ICD-10-CM

## 2019-05-17 DIAGNOSIS — C771 Secondary and unspecified malignant neoplasm of intrathoracic lymph nodes: Secondary | ICD-10-CM

## 2019-05-17 DIAGNOSIS — Z803 Family history of malignant neoplasm of breast: Secondary | ICD-10-CM | POA: Diagnosis not present

## 2019-05-17 DIAGNOSIS — Z833 Family history of diabetes mellitus: Secondary | ICD-10-CM | POA: Diagnosis not present

## 2019-05-17 DIAGNOSIS — I5022 Chronic systolic (congestive) heart failure: Secondary | ICD-10-CM

## 2019-05-17 DIAGNOSIS — D561 Beta thalassemia: Secondary | ICD-10-CM | POA: Diagnosis not present

## 2019-05-17 DIAGNOSIS — D61818 Other pancytopenia: Secondary | ICD-10-CM

## 2019-05-17 DIAGNOSIS — I313 Pericardial effusion (noninflammatory): Secondary | ICD-10-CM | POA: Diagnosis not present

## 2019-05-17 DIAGNOSIS — C50512 Malignant neoplasm of lower-outer quadrant of left female breast: Secondary | ICD-10-CM

## 2019-05-17 DIAGNOSIS — Z9012 Acquired absence of left breast and nipple: Secondary | ICD-10-CM | POA: Insufficient documentation

## 2019-05-17 DIAGNOSIS — N6310 Unspecified lump in the right breast, unspecified quadrant: Secondary | ICD-10-CM | POA: Insufficient documentation

## 2019-05-17 DIAGNOSIS — Z79810 Long term (current) use of selective estrogen receptor modulators (SERMs): Secondary | ICD-10-CM | POA: Diagnosis not present

## 2019-05-17 DIAGNOSIS — M3214 Glomerular disease in systemic lupus erythematosus: Secondary | ICD-10-CM

## 2019-05-17 DIAGNOSIS — I10 Essential (primary) hypertension: Secondary | ICD-10-CM

## 2019-05-17 DIAGNOSIS — C7951 Secondary malignant neoplasm of bone: Secondary | ICD-10-CM

## 2019-05-17 DIAGNOSIS — Z9221 Personal history of antineoplastic chemotherapy: Secondary | ICD-10-CM | POA: Diagnosis not present

## 2019-05-17 DIAGNOSIS — K8020 Calculus of gallbladder without cholecystitis without obstruction: Secondary | ICD-10-CM | POA: Diagnosis not present

## 2019-05-17 DIAGNOSIS — R599 Enlarged lymph nodes, unspecified: Secondary | ICD-10-CM | POA: Diagnosis not present

## 2019-05-17 DIAGNOSIS — Z923 Personal history of irradiation: Secondary | ICD-10-CM

## 2019-05-17 DIAGNOSIS — M545 Low back pain: Secondary | ICD-10-CM

## 2019-05-17 DIAGNOSIS — Z170 Estrogen receptor positive status [ER+]: Secondary | ICD-10-CM | POA: Insufficient documentation

## 2019-05-17 DIAGNOSIS — D6489 Other specified anemias: Secondary | ICD-10-CM | POA: Insufficient documentation

## 2019-05-17 DIAGNOSIS — Z79899 Other long term (current) drug therapy: Secondary | ICD-10-CM | POA: Diagnosis not present

## 2019-05-17 DIAGNOSIS — I3139 Other pericardial effusion (noninflammatory): Secondary | ICD-10-CM

## 2019-05-17 LAB — CBC WITH DIFFERENTIAL/PLATELET
Abs Immature Granulocytes: 0.01 10*3/uL (ref 0.00–0.07)
Basophils Absolute: 0 10*3/uL (ref 0.0–0.1)
Basophils Relative: 0 %
Eosinophils Absolute: 0.1 10*3/uL (ref 0.0–0.5)
Eosinophils Relative: 3 %
HCT: 27.1 % — ABNORMAL LOW (ref 36.0–46.0)
Hemoglobin: 8.8 g/dL — ABNORMAL LOW (ref 12.0–15.0)
Immature Granulocytes: 0 %
Lymphocytes Relative: 17 %
Lymphs Abs: 0.5 10*3/uL — ABNORMAL LOW (ref 0.7–4.0)
MCH: 28.6 pg (ref 26.0–34.0)
MCHC: 32.5 g/dL (ref 30.0–36.0)
MCV: 88 fL (ref 80.0–100.0)
Monocytes Absolute: 0.4 10*3/uL (ref 0.1–1.0)
Monocytes Relative: 12 %
Neutro Abs: 2.2 10*3/uL (ref 1.7–7.7)
Neutrophils Relative %: 68 %
Platelets: 215 10*3/uL (ref 150–400)
RBC: 3.08 MIL/uL — ABNORMAL LOW (ref 3.87–5.11)
RDW: 12.1 % (ref 11.5–15.5)
WBC: 3.2 10*3/uL — ABNORMAL LOW (ref 4.0–10.5)
nRBC: 0 % (ref 0.0–0.2)

## 2019-05-17 LAB — COMPREHENSIVE METABOLIC PANEL
ALT: 12 U/L (ref 0–44)
AST: 18 U/L (ref 15–41)
Albumin: 3.3 g/dL — ABNORMAL LOW (ref 3.5–5.0)
Alkaline Phosphatase: 125 U/L (ref 38–126)
Anion gap: 12 (ref 5–15)
BUN: 41 mg/dL — ABNORMAL HIGH (ref 6–20)
CO2: 27 mmol/L (ref 22–32)
Calcium: 8.5 mg/dL — ABNORMAL LOW (ref 8.9–10.3)
Chloride: 105 mmol/L (ref 98–111)
Creatinine, Ser: 1.52 mg/dL — ABNORMAL HIGH (ref 0.44–1.00)
GFR calc Af Amer: 45 mL/min — ABNORMAL LOW (ref >60)
GFR calc non Af Amer: 38 mL/min — ABNORMAL LOW (ref >60)
Glucose, Bld: 76 mg/dL (ref 70–99)
Potassium: 3 mmol/L — CL (ref 3.5–5.1)
Sodium: 144 mmol/L (ref 135–145)
Total Bilirubin: 0.3 mg/dL (ref 0.3–1.2)
Total Protein: 7.2 g/dL (ref 6.5–8.1)

## 2019-05-17 NOTE — Telephone Encounter (Signed)
Received call report from [facility].  "Today's K+ = 3.0."  Reached collaborative nurse with results.  Currently in scheduled F/U with provider.

## 2019-05-17 NOTE — Telephone Encounter (Signed)
Scheduled pt's daughter appt on 05/23/19 @ 9am due to her having a keloid on her ear she wants Dr. Darnell Level to look at.

## 2019-05-18 ENCOUNTER — Telehealth: Payer: Self-pay | Admitting: *Deleted

## 2019-05-18 ENCOUNTER — Telehealth: Payer: Self-pay | Admitting: Oncology

## 2019-05-18 LAB — CANCER ANTIGEN 27.29: CA 27.29: 39.5 U/mL — ABNORMAL HIGH (ref 0.0–38.6)

## 2019-05-18 NOTE — Telephone Encounter (Signed)
This RN contacted pt per noted potassium of 3.0 per lab draw today.  Pt states she was taken off her potassium when she was put on Entresto by her kidney doctor, Dr Lavonia Dana at [facility].  This RN discussed eating foods high in potassium but that labs would be forwarded to Dr Juleen China.

## 2019-05-18 NOTE — Telephone Encounter (Signed)
I talk with patient regarding schedule  

## 2019-05-25 ENCOUNTER — Telehealth: Payer: Self-pay | Admitting: Adult Health

## 2019-05-25 ENCOUNTER — Other Ambulatory Visit: Payer: Self-pay | Admitting: Radiology

## 2019-05-25 NOTE — Telephone Encounter (Signed)
I talk with patient regarding time change per Mendel Ryder on 8/26

## 2019-05-26 ENCOUNTER — Ambulatory Visit (HOSPITAL_COMMUNITY): Payer: 59

## 2019-05-26 ENCOUNTER — Other Ambulatory Visit: Payer: Self-pay | Admitting: Radiology

## 2019-05-29 ENCOUNTER — Ambulatory Visit (HOSPITAL_COMMUNITY): Admission: RE | Admit: 2019-05-29 | Payer: 59 | Source: Ambulatory Visit

## 2019-05-29 ENCOUNTER — Ambulatory Visit (HOSPITAL_COMMUNITY): Payer: 59

## 2019-06-08 ENCOUNTER — Other Ambulatory Visit: Payer: Self-pay | Admitting: Student

## 2019-06-08 ENCOUNTER — Other Ambulatory Visit: Payer: Self-pay | Admitting: Radiology

## 2019-06-09 ENCOUNTER — Ambulatory Visit (HOSPITAL_COMMUNITY)
Admission: RE | Admit: 2019-06-09 | Discharge: 2019-06-09 | Disposition: A | Discharge status: Home / Self Care | Payer: No Typology Code available for payment source | Source: Ambulatory Visit | Attending: Oncology | Admitting: Oncology

## 2019-06-09 ENCOUNTER — Encounter (HOSPITAL_COMMUNITY): Payer: Self-pay

## 2019-06-09 ENCOUNTER — Other Ambulatory Visit: Payer: Self-pay

## 2019-06-09 DIAGNOSIS — Z9012 Acquired absence of left breast and nipple: Secondary | ICD-10-CM | POA: Diagnosis not present

## 2019-06-09 DIAGNOSIS — Z9221 Personal history of antineoplastic chemotherapy: Secondary | ICD-10-CM | POA: Diagnosis not present

## 2019-06-09 DIAGNOSIS — I3139 Other pericardial effusion (noninflammatory): Secondary | ICD-10-CM

## 2019-06-09 DIAGNOSIS — N189 Chronic kidney disease, unspecified: Secondary | ICD-10-CM | POA: Diagnosis not present

## 2019-06-09 DIAGNOSIS — C7951 Secondary malignant neoplasm of bone: Secondary | ICD-10-CM | POA: Diagnosis not present

## 2019-06-09 DIAGNOSIS — Z79899 Other long term (current) drug therapy: Secondary | ICD-10-CM | POA: Insufficient documentation

## 2019-06-09 DIAGNOSIS — I509 Heart failure, unspecified: Secondary | ICD-10-CM | POA: Diagnosis not present

## 2019-06-09 DIAGNOSIS — I130 Hypertensive heart and chronic kidney disease with heart failure and stage 1 through stage 4 chronic kidney disease, or unspecified chronic kidney disease: Secondary | ICD-10-CM | POA: Insufficient documentation

## 2019-06-09 DIAGNOSIS — Z170 Estrogen receptor positive status [ER+]: Secondary | ICD-10-CM

## 2019-06-09 DIAGNOSIS — Z79811 Long term (current) use of aromatase inhibitors: Secondary | ICD-10-CM | POA: Diagnosis not present

## 2019-06-09 DIAGNOSIS — Z923 Personal history of irradiation: Secondary | ICD-10-CM | POA: Diagnosis not present

## 2019-06-09 DIAGNOSIS — Z6832 Body mass index (BMI) 32.0-32.9, adult: Secondary | ICD-10-CM | POA: Insufficient documentation

## 2019-06-09 DIAGNOSIS — Z7983 Long term (current) use of bisphosphonates: Secondary | ICD-10-CM | POA: Insufficient documentation

## 2019-06-09 DIAGNOSIS — E669 Obesity, unspecified: Secondary | ICD-10-CM | POA: Diagnosis not present

## 2019-06-09 DIAGNOSIS — M329 Systemic lupus erythematosus, unspecified: Secondary | ICD-10-CM | POA: Insufficient documentation

## 2019-06-09 DIAGNOSIS — C771 Secondary and unspecified malignant neoplasm of intrathoracic lymph nodes: Secondary | ICD-10-CM

## 2019-06-09 DIAGNOSIS — C50512 Malignant neoplasm of lower-outer quadrant of left female breast: Secondary | ICD-10-CM

## 2019-06-09 DIAGNOSIS — M3214 Glomerular disease in systemic lupus erythematosus: Secondary | ICD-10-CM

## 2019-06-09 DIAGNOSIS — M899 Disorder of bone, unspecified: Secondary | ICD-10-CM | POA: Diagnosis present

## 2019-06-09 DIAGNOSIS — I313 Pericardial effusion (noninflammatory): Secondary | ICD-10-CM

## 2019-06-09 DIAGNOSIS — Z853 Personal history of malignant neoplasm of breast: Secondary | ICD-10-CM | POA: Diagnosis not present

## 2019-06-09 LAB — PROTIME-INR
INR: 0.9 (ref 0.8–1.2)
Prothrombin Time: 12.4 seconds (ref 11.4–15.2)

## 2019-06-09 LAB — CBC
HCT: 29.8 % — ABNORMAL LOW (ref 36.0–46.0)
Hemoglobin: 9.7 g/dL — ABNORMAL LOW (ref 12.0–15.0)
MCH: 29.1 pg (ref 26.0–34.0)
MCHC: 32.6 g/dL (ref 30.0–36.0)
MCV: 89.5 fL (ref 80.0–100.0)
Platelets: 176 10*3/uL (ref 150–400)
RBC: 3.33 MIL/uL — ABNORMAL LOW (ref 3.87–5.11)
RDW: 12.4 % (ref 11.5–15.5)
WBC: 3 10*3/uL — ABNORMAL LOW (ref 4.0–10.5)
nRBC: 0 % (ref 0.0–0.2)

## 2019-06-09 MED ORDER — MIDAZOLAM HCL 2 MG/2ML IJ SOLN
INTRAMUSCULAR | Status: AC | PRN
  Administered 2019-06-09: 1 mg via INTRAVENOUS

## 2019-06-09 MED ORDER — MIDAZOLAM HCL 2 MG/2ML IJ SOLN
INTRAMUSCULAR | Status: AC
  Filled 2019-06-09: qty 4

## 2019-06-09 MED ORDER — LIDOCAINE HCL (PF) 1 % IJ SOLN
INTRAMUSCULAR | Status: AC | PRN
  Administered 2019-06-09 (×2): 5 mL

## 2019-06-09 MED ORDER — SODIUM CHLORIDE 0.9 % IV SOLN
INTRAVENOUS | Status: DC
  Administered 2019-06-09: 08:00:00 via INTRAVENOUS

## 2019-06-09 MED ORDER — FENTANYL CITRATE (PF) 100 MCG/2ML IJ SOLN
INTRAMUSCULAR | Status: AC
  Filled 2019-06-09: qty 2

## 2019-06-09 MED ORDER — HYDROCODONE-ACETAMINOPHEN 5-325 MG PO TABS: 1.0000 | ORAL_TABLET | ORAL | Status: DC | PRN

## 2019-06-09 MED ORDER — FENTANYL CITRATE (PF) 100 MCG/2ML IJ SOLN
INTRAMUSCULAR | Status: AC | PRN
  Administered 2019-06-09: 50 ug via INTRAVENOUS

## 2019-06-09 NOTE — Procedures (Signed)
Interventional Radiology Procedure Note  Procedure: CT guided bx of L2 veterbral body l;esion  Complications: None  Estimated Blood Loss: None  Recommendations: - DC home  Signed,  Criselda Peaches, MD

## 2019-06-09 NOTE — Discharge Instructions (Signed)
Needle Biopsy, Care After °This sheet gives you information about how to care for yourself after your procedure. Your health care provider may also give you more specific instructions. If you have problems or questions, contact your health care provider. °What can I expect after the procedure? °After the procedure, it is common to have soreness, bruising, or mild pain at the puncture site. This should go away in a few days. °Follow these instructions at home: °Needle insertion site care ° °· Wash your hands with soap and water before you change your bandage (dressing). If you cannot use soap and water, use hand sanitizer. °· Follow instructions from your health care provider about how to take care of your puncture site. This includes: °? When and how to change your dressing. °? When to remove your dressing. °· Check your puncture site every day for signs of infection. Check for: °? Redness, swelling, or pain. °? Fluid or blood. °? Pus or a bad smell. °? Warmth. °General instructions °· Return to your normal activities as told by your health care provider. Ask your health care provider what activities are safe for you. °· Do not take baths, swim, or use a hot tub until your health care provider approves. Ask your health care provider if you may take showers. You may only be allowed to take sponge baths. °· Take over-the-counter and prescription medicines only as told by your health care provider. °· Keep all follow-up visits as told by your health care provider. This is important. °Contact a health care provider if: °· You have a fever. °· You have redness, swelling, or pain at the puncture site that lasts longer than a few days. °· You have fluid, blood, or pus coming from your puncture site. °· Your puncture site feels warm to the touch. °Get help right away if: °· You have severe bleeding from the puncture site. °Summary °· After the procedure, it is common to have soreness, bruising, or mild pain at the puncture  site. This should go away in a few days. °· Check your puncture site every day for signs of infection, such as redness, swelling, or pain. °· Get help right away if you have severe bleeding from your puncture site. °This information is not intended to replace advice given to you by your health care provider. Make sure you discuss any questions you have with your health care provider. °Document Released: 02/19/2015 Document Revised: 12/17/2017 Document Reviewed: 10/18/2017 °Elsevier Patient Education © 2020 [employer] Inc[employer] °Moderate Conscious Sedation, Adult, Care After °These instructions provide you with information about caring for yourself after your procedure. Your health care provider may also give you more specific instructions. Your treatment has been planned according to current medical practices, but problems sometimes occur. Call your health care provider if you have any problems or questions after your procedure. °What can I expect after the procedure? °After your procedure, it is common: °· To feel sleepy for several hours. °· To feel clumsy and have poor balance for several hours. °· To have poor judgment for several hours. °· To vomit if you eat too soon. °Follow these instructions at home: °For at least 24 hours after the procedure: ° °· Do not: °? Participate in activities where you could fall or become injured. °? Drive. °? Use heavy machinery. °? Drink alcohol. °? Take sleeping pills or medicines that cause drowsiness. °? Make important decisions or sign legal documents. °? Take care of children on your own. °· Rest. °Eating and   drinking °· Follow the diet recommended by your health care provider. °· If you vomit: °? Drink water, juice, or soup when you can drink without vomiting. °? Make sure you have little or no nausea before eating solid foods. °General instructions °· Have a responsible adult stay with you until you are awake and alert. °· Take over-the-counter and prescription medicines only as  told by your health care provider. °· If you smoke, do not smoke without supervision. °· Keep all follow-up visits as told by your health care provider. This is important. °Contact a health care provider if: °· You keep feeling nauseous or you keep vomiting. °· You feel light-headed. °· You develop a rash. °· You have a fever. °Get help right away if: °· You have trouble breathing. °This information is not intended to replace advice given to you by your health care provider. Make sure you discuss any questions you have with your health care provider. °Document Released: 07/26/2013 Document Revised: 09/17/2017 Document Reviewed: 01/25/2016 °Elsevier Patient Education © 2020 [employer] Inc. ° °
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
## 2019-06-14 ENCOUNTER — Other Ambulatory Visit: Payer: Self-pay

## 2019-06-14 ENCOUNTER — Ambulatory Visit: Payer: 59 | Admitting: Adult Health

## 2019-06-14 ENCOUNTER — Inpatient Hospital Stay: Payer: 59 | Attending: Oncology

## 2019-06-14 ENCOUNTER — Inpatient Hospital Stay (HOSPITAL_BASED_OUTPATIENT_CLINIC_OR_DEPARTMENT_OTHER): Payer: 59 | Admitting: Adult Health

## 2019-06-14 ENCOUNTER — Other Ambulatory Visit: Payer: 59

## 2019-06-14 VITALS — BP 143/80 | HR 86 | Temp 98.3°F | Resp 16 | Ht 59.0 in | Wt 161.9 lb

## 2019-06-14 DIAGNOSIS — C50512 Malignant neoplasm of lower-outer quadrant of left female breast: Secondary | ICD-10-CM | POA: Diagnosis not present

## 2019-06-14 DIAGNOSIS — Z79811 Long term (current) use of aromatase inhibitors: Secondary | ICD-10-CM | POA: Insufficient documentation

## 2019-06-14 DIAGNOSIS — E669 Obesity, unspecified: Secondary | ICD-10-CM | POA: Diagnosis not present

## 2019-06-14 DIAGNOSIS — Z9221 Personal history of antineoplastic chemotherapy: Secondary | ICD-10-CM | POA: Diagnosis not present

## 2019-06-14 DIAGNOSIS — Z79810 Long term (current) use of selective estrogen receptor modulators (SERMs): Secondary | ICD-10-CM | POA: Diagnosis not present

## 2019-06-14 DIAGNOSIS — D649 Anemia, unspecified: Secondary | ICD-10-CM | POA: Diagnosis not present

## 2019-06-14 DIAGNOSIS — Z9012 Acquired absence of left breast and nipple: Secondary | ICD-10-CM | POA: Diagnosis not present

## 2019-06-14 DIAGNOSIS — I509 Heart failure, unspecified: Secondary | ICD-10-CM | POA: Diagnosis not present

## 2019-06-14 DIAGNOSIS — Z170 Estrogen receptor positive status [ER+]: Secondary | ICD-10-CM

## 2019-06-14 DIAGNOSIS — I130 Hypertensive heart and chronic kidney disease with heart failure and stage 1 through stage 4 chronic kidney disease, or unspecified chronic kidney disease: Secondary | ICD-10-CM | POA: Diagnosis not present

## 2019-06-14 DIAGNOSIS — C7951 Secondary malignant neoplasm of bone: Secondary | ICD-10-CM | POA: Diagnosis not present

## 2019-06-14 DIAGNOSIS — M3214 Glomerular disease in systemic lupus erythematosus: Secondary | ICD-10-CM | POA: Diagnosis not present

## 2019-06-14 DIAGNOSIS — Z923 Personal history of irradiation: Secondary | ICD-10-CM | POA: Diagnosis not present

## 2019-06-14 DIAGNOSIS — Z803 Family history of malignant neoplasm of breast: Secondary | ICD-10-CM | POA: Diagnosis not present

## 2019-06-14 DIAGNOSIS — Z7189 Other specified counseling: Secondary | ICD-10-CM | POA: Diagnosis not present

## 2019-06-14 DIAGNOSIS — N189 Chronic kidney disease, unspecified: Secondary | ICD-10-CM | POA: Diagnosis not present

## 2019-06-14 DIAGNOSIS — Z79899 Other long term (current) drug therapy: Secondary | ICD-10-CM | POA: Diagnosis not present

## 2019-06-14 DIAGNOSIS — Z833 Family history of diabetes mellitus: Secondary | ICD-10-CM | POA: Insufficient documentation

## 2019-06-14 DIAGNOSIS — D561 Beta thalassemia: Secondary | ICD-10-CM | POA: Diagnosis not present

## 2019-06-14 LAB — COMPREHENSIVE METABOLIC PANEL
ALT: 10 U/L (ref 0–44)
AST: 16 U/L (ref 15–41)
Albumin: 3.6 g/dL (ref 3.5–5.0)
Alkaline Phosphatase: 100 U/L (ref 38–126)
Anion gap: 11 (ref 5–15)
BUN: 59 mg/dL — ABNORMAL HIGH (ref 6–20)
CO2: 26 mmol/L (ref 22–32)
Calcium: 9.1 mg/dL (ref 8.9–10.3)
Chloride: 104 mmol/L (ref 98–111)
Creatinine, Ser: 1.81 mg/dL — ABNORMAL HIGH (ref 0.44–1.00)
GFR calc Af Amer: 36 mL/min — ABNORMAL LOW (ref >60)
GFR calc non Af Amer: 31 mL/min — ABNORMAL LOW (ref >60)
Glucose, Bld: 85 mg/dL (ref 70–99)
Potassium: 4 mmol/L (ref 3.5–5.1)
Sodium: 141 mmol/L (ref 135–145)
Total Bilirubin: 0.3 mg/dL (ref 0.3–1.2)
Total Protein: 7.4 g/dL (ref 6.5–8.1)

## 2019-06-14 LAB — CBC WITH DIFFERENTIAL/PLATELET
Abs Immature Granulocytes: 0.01 10*3/uL (ref 0.00–0.07)
Basophils Absolute: 0 10*3/uL (ref 0.0–0.1)
Basophils Relative: 0 %
Eosinophils Absolute: 0.1 10*3/uL (ref 0.0–0.5)
Eosinophils Relative: 3 %
HCT: 26.7 % — ABNORMAL LOW (ref 36.0–46.0)
Hemoglobin: 9.1 g/dL — ABNORMAL LOW (ref 12.0–15.0)
Immature Granulocytes: 0 %
Lymphocytes Relative: 23 %
Lymphs Abs: 0.7 10*3/uL (ref 0.7–4.0)
MCH: 29.2 pg (ref 26.0–34.0)
MCHC: 34.1 g/dL (ref 30.0–36.0)
MCV: 85.6 fL (ref 80.0–100.0)
Monocytes Absolute: 0.4 10*3/uL (ref 0.1–1.0)
Monocytes Relative: 13 %
Neutro Abs: 1.8 10*3/uL (ref 1.7–7.7)
Neutrophils Relative %: 61 %
Platelets: 173 10*3/uL (ref 150–400)
RBC: 3.12 MIL/uL — ABNORMAL LOW (ref 3.87–5.11)
RDW: 12.2 % (ref 11.5–15.5)
WBC: 3 10*3/uL — ABNORMAL LOW (ref 4.0–10.5)
nRBC: 0 % (ref 0.0–0.2)

## 2019-06-14 NOTE — Patient Instructions (Addendum)
Fulvestrant injection What is this medicine? FULVESTRANT (ful VES trant) blocks the effects of estrogen. It is used to treat breast cancer. This medicine may be used for other purposes; ask your health care provider or pharmacist if you have questions. COMMON BRAND NAME(S): FASLODEX What should I tell my health care provider before I take this medicine? They need to know if you have any of these conditions:  bleeding disorders  liver disease  low blood counts, like low white cell, platelet, or red cell counts  an unusual or allergic reaction to fulvestrant, other medicines, foods, dyes, or preservatives  pregnant or trying to get pregnant  breast-feeding How should I use this medicine? This medicine is for injection into a muscle. It is usually given by a health care professional in a hospital or clinic setting. Talk to your pediatrician regarding the use of this medicine in children. Special care may be needed. Overdosage: If you think you have taken too much of this medicine contact a poison control center or emergency room at once. NOTE: This medicine is only for you. Do not share this medicine with others. What if I miss a dose? It is important not to miss your dose. Call your doctor or health care professional if you are unable to keep an appointment. What may interact with this medicine?  medicines that treat or prevent blood clots like warfarin, enoxaparin, dalteparin, apixaban, dabigatran, and rivaroxaban This list may not describe all possible interactions. Give your health care provider a list of all the medicines, herbs, non-prescription drugs, or dietary supplements you use. Also tell them if you smoke, drink alcohol, or use illegal drugs. Some items may interact with your medicine. What should I watch for while using this medicine? Your condition will be monitored carefully while you are receiving this medicine. You will need important blood work done while you are taking  this medicine. Do not become pregnant while taking this medicine or for at least 1 year after stopping it. Women of child-bearing potential will need to have a negative pregnancy test before starting this medicine. Women should inform their doctor if they wish to become pregnant or think they might be pregnant. There is a potential for serious side effects to an unborn child. Men should inform their doctors if they wish to father a child. This medicine may lower sperm counts. Talk to your health care professional or pharmacist for more information. Do not breast-feed an infant while taking this medicine or for 1 year after the last dose. What side effects may I notice from receiving this medicine? Side effects that you should report to your doctor or health care professional as soon as possible:  allergic reactions like skin rash, itching or hives, swelling of the face, lips, or tongue  feeling faint or lightheaded, falls  pain, tingling, numbness, or weakness in the legs  signs and symptoms of infection like fever or chills; cough; flu-like symptoms; sore throat  vaginal bleeding Side effects that usually do not require medical attention (report to your doctor or health care professional if they continue or are bothersome):  aches, pains  constipation  diarrhea  headache  hot flashes  nausea, vomiting  pain at site where injected  stomach pain This list may not describe all possible side effects. Call your doctor for medical advice about side effects. You may report side effects to FDA at 1-800-FDA-1088. Where should I keep my medicine? This drug is given in a hospital or clinic and will   not be stored at home. NOTE: This sheet is a summary. It may not cover all possible information. If you have questions about this medicine, talk to your doctor, pharmacist, or health care provider.  2020 Elsevier/Gold Standard (2018-01-13 11:34:41) Palbociclib capsules What is this  medicine? PALBOCICLIB (pal boe SYE klib) is a medicine that targets proteins in cancer cells and stops the cancer cells from growing. It is used to treat breast cancer. This medicine may be used for other purposes; ask your health care provider or pharmacist if you have questions. COMMON BRAND NAME(S): Ibrance What should I tell my health care provider before I take this medicine? They need to know if you have any of these conditions:  infection (especially a virus infection such as chickenpox, cold sores, or herpes)  low blood counts, like low white cell, platelet, or red cell counts  lung or breathing disease, like asthma  an unusual or allergic reaction to palbociclib, other medicines, foods, dyes, or preservatives  pregnant or trying to get pregnant  breast-feeding How should I use this medicine? Take this medicine by mouth with a glass of water. Follow the directions on the prescription label. Take this medicine with food. Avoid grapefruit and grapefruit juice while you are taking this medicine. Swallow the capsule whole. Do not cut, crush or chew this medicine. Take your medicine at regular intervals. Do not take it more often than directed. Do not stop taking except on your doctor's advice. Talk to your pediatrician regarding the use of this medicine in children. Special care may be needed. Overdosage: If you think you have taken too much of this medicine contact a poison control center or emergency room at once. NOTE: This medicine is only for you. Do not share this medicine with others. What if I miss a dose? If you miss a dose or vomit after taking a dose, do not take another dose on that day. Take your next dose at your regular time. What may interact with this medicine? This medicine may interact with the following medications:  alfentanil  antiviral medicines for HIV or AIDS  carbamazepine  certain medicines for fungal infections such as itraconazole, ketoconazole,  posaconazole, and voriconazole  clarithromycin  cyclosporine  enzalutamide  ergot alkaloids like dihydroergotamine, ergotamine  everolimus  fentanyl  grapefruit juice  midazolam  nefazodone  phenytoin  pimozide  quinidine  rifampin  sirolimus  St. John's Wort  tacrolimus  telithromycin This list may not describe all possible interactions. Give your health care provider a list of all the medicines, herbs, non-prescription drugs, or dietary supplements you use. Also tell them if you smoke, drink alcohol, or use illegal drugs. Some items may interact with your medicine. What should I watch for while using this medicine? Visit your doctor for regular check ups. Report any side effects. Continue your course of treatment unless your doctor tells you to stop. You will need blood work done while you are taking this medicine. Do not become pregnant while taking this medicine or for at least 3 weeks after stopping it. Women should inform their doctor if they wish to become pregnant or think they might be pregnant. Men should not father a child while taking this medicine and for 3 months after stopping it. There is a potential for serious side effects to an unborn child. Men should inform their doctors if they wish to father a child later. This medicine may lower sperm counts. Talk to your health care professional or pharmacist for more  information. Do not breast-feed an infant while taking this medicine or for 3 weeks after the last dose. Avoid taking products that contain aspirin, acetaminophen, ibuprofen, naproxen, or ketoprofen unless instructed by your doctor. These medicines may hide a fever. Be careful brushing and flossing your teeth or using a toothpick because you may get an infection or bleed more easily. If you have any dental work done, tell your dentist you are receiving this medicine. Call your doctor or health care professional for advice if you get a fever, chills or  sore throat, or other symptoms of a cold or flu. Do not treat yourself. This drug decreases your body's ability to fight infections. Try to avoid being around people who are sick. This medicine may increase your risk to bruise or bleed. Call your doctor or health care professional if you notice any unusual bleeding. This drug may make you feel generally unwell. This is not uncommon, as chemotherapy can affect healthy cells as well as cancer cells. Report any side effects. Continue your course of treatment even though you feel ill unless your doctor tells you to stop. What side effects may I notice from receiving this medicine? Side effects that you should report to your doctor or health care professional as soon as possible:  allergic reactions like skin rash, itching or hives, swelling of the face, lips, or tongue  breathing problems  cough  dizziness  mouth sores  low blood counts - this medicine may decrease the number of white blood cells, red blood cells and platelets. You may be at increased risk for infections and bleeding.  pain, tingling, numbness in the hands or feet  severe or persistent diarrhea, nausea, vomiting  signs and symptoms of infection like fever or chills; cough; sore throat; pain or trouble passing urine  signs of decreased platelets or bleeding - nosebleed, bruising, pinpoint red spots on the skin, black, tarry stools, blood in the urine  signs of decreased red blood cells - unusually weak or tired, feeling faint or lightheaded, falls Side effects that usually do not require medical attention (report to your doctor or health care professional if they continue or are bothersome):  decreased appetite  hair thinning or hair loss  mild diarrhea  nausea  weak or tired This list may not describe all possible side effects. Call your doctor for medical advice about side effects. You may report side effects to FDA at 1-800-FDA-1088. Where should I keep my  medicine? Keep out of the reach of children. Store between 20 and 25 degrees C (68 and 77 degrees F). Throw away any unused medicine after the expiration date. NOTE: This sheet is a summary. It may not cover all possible information. If you have questions about this medicine, talk to your doctor, pharmacist, or health care provider.  2020 Elsevier/Gold Standard (2018-12-21 12:15:47)
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
## 2019-06-15 ENCOUNTER — Other Ambulatory Visit: Payer: Self-pay | Admitting: Oncology

## 2019-06-15 ENCOUNTER — Encounter: Payer: Self-pay | Admitting: Adult Health

## 2019-06-15 ENCOUNTER — Telehealth: Payer: Self-pay | Admitting: Adult Health

## 2019-06-15 DIAGNOSIS — Z7189 Other specified counseling: Secondary | ICD-10-CM | POA: Insufficient documentation

## 2019-06-15 MED ORDER — PALBOCICLIB 125 MG PO CAPS: 125.0000 mg | ORAL_CAPSULE | Freq: Every day | ORAL | 6 refills | Status: DC

## 2019-06-15 NOTE — Telephone Encounter (Signed)
I talk with patient regarding schedule  

## 2019-06-16 ENCOUNTER — Telehealth: Payer: Self-pay | Admitting: *Deleted

## 2019-06-16 ENCOUNTER — Telehealth: Payer: Self-pay

## 2019-06-16 ENCOUNTER — Telehealth: Payer: Self-pay | Admitting: Adult Health

## 2019-06-16 ENCOUNTER — Telehealth: Payer: Self-pay | Admitting: Pharmacist

## 2019-06-16 DIAGNOSIS — C50512 Malignant neoplasm of lower-outer quadrant of left female breast: Secondary | ICD-10-CM

## 2019-06-16 DIAGNOSIS — Z170 Estrogen receptor positive status [ER+]: Secondary | ICD-10-CM

## 2019-06-16 MED ORDER — PALBOCICLIB 125 MG PO TABS: 125.0000 mg | ORAL_TABLET | Freq: Every day | ORAL | 6 refills | Status: DC

## 2019-06-16 NOTE — Telephone Encounter (Signed)
Called pt per 8/28 sch message- no answer / left message for patient to call back for reschedule.

## 2019-06-16 NOTE — Telephone Encounter (Signed)
This RN spoke with pt per her call this AM with concerns with proceeding with planned treatment " Just a lot for me to take in ".  " feel like people want me to do treatment and I don't know if I am ready or want to do this "   " I get a call that I am scheduled for treatment next week and then I get a call from the pharmacy telling me the chemo pills are ready for me to pick up and they will cost me $100 a prescription "  Note Jenette's tone was direct in conversation and her inquiries and answers were guarded " it's like this was decided before anyone talked with me about it "  This RN validated pt's concerns as well as asked open ended questions to assist her in her thought process regarding being diagnosed with stage 4 breast cancer and how that impacts her life.  Per 30 minute conversation - Bayyinah was able to verbalize :  Feeling overwhelmed by diagnosis itself " just need time to think about it "  Concern for cost issues and long term financial burden on family. Becoming a physical burden on family - and how that impacts their life. Concerns regarding side effects of therapy " I have been reading up on the medications "  " just need someone on my side "   Again this RN validated her concerns.  Diagnosis discussed as well as history of use of therapy and expected outcomes. This RN did discuss possible outcome of disease progression if pt's choice was was not to proceed with chemotherapy.  This RN reiterated goal of provider and health team is to help pt make an informed decision - and then we can provide the appropriate support of her decision.  As discussion progressed- Kaileen became less guarded and able to verbalize her feelings as well moments of tearfulness. This RN again provided verbalized support.  Per end of discussion- Jermika's tone was more relaxed and conversational- with understanding of plan at present as:  Racquel will process her concerns over the weekend - including  discussing with family.  This RN will inquire with resources in this office for any financial assistance that would lower cost for her ( she did state cost of therapy is a major concern and part of her decision process ). [place] states financial cost is a major factor for her right now.  Dineen understands a plan for therapy is in place - and available but we can touch base on Monday for further review of situation and proceed from there.  Laverna stated appreciation of call and discussion.  This note will be forwarded to providers as well as to Social Work and [place] for possible resources.

## 2019-06-16 NOTE — Telephone Encounter (Signed)
So she will not have an out of pocket cost?

## 2019-06-16 NOTE — Telephone Encounter (Signed)
Oral Oncology Patient Advocate Encounter  Ibrance copay is $100, this is not affordable for the patient.  I was able to secure the patient a copay card with [employer] to make the patients out of pocket cost $0. This information will be shared with [facility] and is as follows:  BIN: [id number] Grp: [id number] ID: [id number] Exp. 10/18/2021  I spoke to the patient about this and she verbalized understanding and great appreciation.  [place] Patient [name] Phone [phone] Fax [phone] 06/16/2019   12:49 PM

## 2019-06-16 NOTE — Telephone Encounter (Signed)
She has f/u with [place] on Wednesday, 9/2, so we can reassess and discuss further then as well.

## 2019-06-16 NOTE — Telephone Encounter (Signed)
Oral Chemotherapy Pharmacist Encounter   I spoke with patient for overview of: Ibrance for the treatment of metastatic, hormone receptor positive, Her-2 receptor negative breast cancer in conjunction with Faslodex (fulvestrant) injections, planned duration until disease progression or unacceptable toxicity.  Patient was originally evaluated for Ibrance start in late June 2020 with letrozole. Patient with new progressions and Leslee Home will now be paired with Faslodex injections.  Counseled patient on administration, dosing, side effects, monitoring, drug-food interactions, safe handling, storage, and disposal.  Patient will take Ibrance 177m tablets, 1 tablet by mouth once daily, with or without food, taken for 3 weeks on, 1 week off, and repeated.  Patient knows to avoid grapefruit and grapefruit juice while on treatment with Ibrance.  Ibrance start date: TBD, pending medication acquisition Patient states [facility] to [facility] fill [facility]brance, but told them to hold off until she was able to have further discussion with the office and with her family  Adverse effects include but are not limited to: fatigue, hair loss, GI upset, nausea, decreased blood counts, and increased upper respiratory infections. Severe, life-threatening, and/or fatal interstitial lung disease (ILD) and/or pneumonitis may occur with CDK 4/6 inhibitors.  Patient informed of WBC check on Cycle 1 Day 14 for dose and ANC assessment.  Reviewed with patient importance of keeping a medication schedule and plan for any missed doses.  Medication reconciliation performed and medication/allergy list updated.  Insurance authorization for ILeslee Homehas been obtained. Test claim at the pharmacy revealed copayment $100 for 1st fill of Ibrance. Oral oncology patient advocate will secure manufacturer copayment coupon to cover out of pocket costs for Ibrance at the pharmacy. This information will be shared with  dispensing pharmacy.  Patient understands she will need to contact pharmacy to order her 1st fill when she is ready. She is familiar with [facility].  Patient informed IL[facility]lee Homemust be filled [facility] per [facility] requirement. We will work with pharmacy to ensure timely dispensing of medication. A new prescription for Ibrance tablets was sent to dispensing pharmacy today.  We discussed Faslodex loading and maintenance injections. Patient with many questions on whether anyone is working on a cancer "cure." We discussed the definition of cure and pathology of different cancers in detail.  All questions answered.  Ms. GPytelvoiced understanding and appreciation.   Patient knows to call the office with questions or concerns.  JJohny Drilling PharmD, BCPS, BCOP  06/16/2019   12:44 PM Oral Oncology Clinic [phone]

## 2019-06-20 ENCOUNTER — Encounter (HOSPITAL_COMMUNITY): Payer: Self-pay | Admitting: General Practice

## 2019-06-20 ENCOUNTER — Encounter: Payer: Self-pay | Admitting: General Practice

## 2019-06-20 NOTE — Telephone Encounter (Signed)
Visit w CSW needs to be rescheduled - she cancelled tomorrow 9/2 appt and rescheduled for 9/15 when I am not at [facility].  I let Vernie Shanks know so she can connect w her.

## 2019-06-20 NOTE — Progress Notes (Signed)
[place] CSW Progress Notes  Patient rescheduled tomorrow's appointment for Tuesday Sept 15.  CSW [place] asked to schedule w patient as Grayce Sessions is not at [facility] on that day.  Edwyna Shell, LCSW Clinical Social Worker Phone:  [phone]

## 2019-06-20 NOTE — Progress Notes (Signed)
[place] CSW Progress Notes  Request received to connect with patient to offer support/resources.  Called patient, had difficulty hearing patient.  Will schedule brief visit to touch base w her at tomorrow's oncology appointment.  Edwyna Shell, LCSW Clinical Social Worker Phone:  [phone]

## 2019-06-21 ENCOUNTER — Inpatient Hospital Stay: Payer: No Typology Code available for payment source

## 2019-06-21 ENCOUNTER — Inpatient Hospital Stay: Payer: No Typology Code available for payment source | Admitting: General Practice

## 2019-06-21 ENCOUNTER — Ambulatory Visit: Payer: 59

## 2019-06-21 ENCOUNTER — Inpatient Hospital Stay: Payer: No Typology Code available for payment source | Admitting: Adult Health

## 2019-06-22 NOTE — Telephone Encounter (Signed)
Oral Oncology Patient Advocate Encounter  I followed up with [facility] on the [facility] prescription.  Ibrance copay is $0 using the copay card.  [facility] has attempted to reach the patient to schedule shipment with no success or return call.  Oral Oncology clinic will continue to follow.  [place] Patient [place] Phone [phone] Fax [phone] 06/22/2019   9:42 AM

## 2019-06-22 NOTE — Telephone Encounter (Signed)
Oral Chemotherapy Pharmacist Encounter   Attempted to reach patient to further discuss Ibrance start and offer suppert No answer. Phone rang and rang without voicemail option. I will continue to check on patient periodically to offer support.  Johny Drilling, PharmD, BCPS, BCOP  06/22/2019   1:26 PM Oral Oncology Clinic [phone]

## 2019-06-23 ENCOUNTER — Ambulatory Visit (INDEPENDENT_AMBULATORY_CARE_PROVIDER_SITE_OTHER): Payer: 59 | Admitting: Family Medicine

## 2019-06-23 ENCOUNTER — Other Ambulatory Visit: Payer: Self-pay

## 2019-06-23 ENCOUNTER — Telehealth: Payer: Self-pay | Admitting: *Deleted

## 2019-06-23 ENCOUNTER — Encounter: Payer: Self-pay | Admitting: Family Medicine

## 2019-06-23 VITALS — BP 118/68 | HR 74 | Temp 98.2°F | Ht <= 58 in | Wt 158.1 lb

## 2019-06-23 DIAGNOSIS — E559 Vitamin D deficiency, unspecified: Secondary | ICD-10-CM

## 2019-06-23 DIAGNOSIS — I10 Essential (primary) hypertension: Secondary | ICD-10-CM

## 2019-06-23 DIAGNOSIS — E66811 Obesity, class 1: Secondary | ICD-10-CM

## 2019-06-23 DIAGNOSIS — I5022 Chronic systolic (congestive) heart failure: Secondary | ICD-10-CM

## 2019-06-23 DIAGNOSIS — Z1211 Encounter for screening for malignant neoplasm of colon: Secondary | ICD-10-CM

## 2019-06-23 DIAGNOSIS — Z0000 Encounter for general adult medical examination without abnormal findings: Secondary | ICD-10-CM | POA: Diagnosis not present

## 2019-06-23 DIAGNOSIS — E669 Obesity, unspecified: Secondary | ICD-10-CM

## 2019-06-23 DIAGNOSIS — M25572 Pain in left ankle and joints of left foot: Secondary | ICD-10-CM

## 2019-06-23 DIAGNOSIS — Z170 Estrogen receptor positive status [ER+]: Secondary | ICD-10-CM

## 2019-06-23 DIAGNOSIS — E785 Hyperlipidemia, unspecified: Secondary | ICD-10-CM | POA: Diagnosis not present

## 2019-06-23 DIAGNOSIS — D649 Anemia, unspecified: Secondary | ICD-10-CM

## 2019-06-23 DIAGNOSIS — M3214 Glomerular disease in systemic lupus erythematosus: Secondary | ICD-10-CM

## 2019-06-23 DIAGNOSIS — C50512 Malignant neoplasm of lower-outer quadrant of left female breast: Secondary | ICD-10-CM

## 2019-06-23 DIAGNOSIS — L930 Discoid lupus erythematosus: Secondary | ICD-10-CM

## 2019-06-23 DIAGNOSIS — D72819 Decreased white blood cell count, unspecified: Secondary | ICD-10-CM

## 2019-06-23 DIAGNOSIS — G8929 Other chronic pain: Secondary | ICD-10-CM

## 2019-06-23 DIAGNOSIS — C7951 Secondary malignant neoplasm of bone: Secondary | ICD-10-CM

## 2019-06-23 LAB — LIPID PANEL
Cholesterol: 174 mg/dL (ref 0–200)
HDL: 39 mg/dL — ABNORMAL LOW (ref >39.00)
LDL Cholesterol: 106 mg/dL — ABNORMAL HIGH (ref 0–99)
NonHDL: 134.92
Total CHOL/HDL Ratio: 4
Triglycerides: 146 mg/dL (ref 0.0–149.0)
VLDL: 29.2 mg/dL (ref 0.0–40.0)

## 2019-06-23 LAB — TSH: TSH: 3.13 u[IU]/mL (ref 0.35–4.50)

## 2019-06-23 LAB — VITAMIN D 25 HYDROXY (VIT D DEFICIENCY, FRACTURES): VITD: 47.11 ng/mL (ref 30.00–100.00)

## 2019-06-23 NOTE — Patient Instructions (Addendum)
Pass by lab for stool kit Return at your convenience for skin tag removal  Get flu shot later this season, as soon as you can.  Keep follow up with oncology   Health Maintenance for Postmenopausal Women Menopause is a normal process in which your ability to get pregnant comes to an end. This process happens slowly over many months or years, usually between the ages of 10 and 59. Menopause is complete when you have missed your menstrual periods for 12 months. It is important to talk with your health care provider about some of the most common conditions that affect women after menopause (postmenopausal women). These include heart disease, cancer, and bone loss (osteoporosis). Adopting a healthy lifestyle and getting preventive care can help to promote your health and wellness. The actions you take can also lower your chances of developing some of these common conditions. What should I know about menopause? During menopause, you may get a number of symptoms, such as:  Hot flashes. These can be moderate or severe.  Night sweats.  Decrease in sex drive.  Mood swings.  Headaches.  Tiredness.  Irritability.  Memory problems.  Insomnia. Choosing to treat or not to treat these symptoms is a decision that you make with your health care provider. Do I need hormone replacement therapy?  Hormone replacement therapy is effective in treating symptoms that are caused by menopause, such as hot flashes and night sweats.  Hormone replacement carries certain risks, especially as you become older. If you are thinking about using estrogen or estrogen with progestin, discuss the benefits and risks with your health care provider. What is my risk for heart disease and stroke? The risk of heart disease, heart attack, and stroke increases as you age. One of the causes may be a change in the body's hormones during menopause. This can affect how your body uses dietary fats, triglycerides, and cholesterol.  Heart attack and stroke are medical emergencies. There are many things that you can do to help prevent heart disease and stroke. Watch your blood pressure  High blood pressure causes heart disease and increases the risk of stroke. This is more likely to develop in people who have high blood pressure readings, are of African descent, or are overweight.  Have your blood pressure checked: ? Every 3-5 years if you are 60-87 years of age. ? Every year if you are 59 years old or older. Eat a healthy diet   Eat a diet that includes plenty of vegetables, fruits, low-fat dairy products, and lean protein.  Do not eat a lot of foods that are high in solid fats, added sugars, or sodium. Get regular exercise Get regular exercise. This is one of the most important things you can do for your health. Most adults should:  Try to exercise for at least 150 minutes each week. The exercise should increase your heart rate and make you sweat (moderate-intensity exercise).  Try to do strengthening exercises at least twice each week. Do these in addition to the moderate-intensity exercise.  Spend less time sitting. Even light physical activity can be beneficial. Other tips  Work with your health care provider to achieve or maintain a healthy weight.  Do not use any products that contain nicotine or tobacco, such as cigarettes, e-cigarettes, and chewing tobacco. If you need help quitting, ask your health care provider.  Know your numbers. Ask your health care provider to check your cholesterol and your blood sugar (glucose). Continue to have your blood tested as  directed by your health care provider. Do I need screening for cancer? Depending on your health history and family history, you may need to have cancer screening at different stages of your life. This may include screening for:  Breast cancer.  Cervical cancer.  Lung cancer.  Colorectal cancer. What is my risk for osteoporosis? After menopause,  you may be at increased risk for osteoporosis. Osteoporosis is a condition in which bone destruction happens more quickly than new bone creation. To help prevent osteoporosis or the bone fractures that can happen because of osteoporosis, you may take the following actions:  If you are 50-69 years old, get at least 1,000 mg of calcium and at least 600 mg of vitamin D per day.  If you are older than age 37 but younger than age 75, get at least 1,200 mg of calcium and at least 600 mg of vitamin D per day.  If you are older than age 48, get at least 1,200 mg of calcium and at least 800 mg of vitamin D per day. Smoking and drinking excessive alcohol increase the risk of osteoporosis. Eat foods that are rich in calcium and vitamin D, and do weight-bearing exercises several times each week as directed by your health care provider. How does menopause affect my mental health? Depression may occur at any age, but it is more common as you become older. Common symptoms of depression include:  Low or sad mood.  Changes in sleep patterns.  Changes in appetite or eating patterns.  Feeling an overall lack of motivation or enjoyment of activities that you previously enjoyed.  Frequent crying spells. Talk with your health care provider if you think that you are experiencing depression. General instructions See your health care provider for regular wellness exams and vaccines. This may include:  Scheduling regular health, dental, and eye exams.  Getting and maintaining your vaccines. These include: ? Influenza vaccine. Get this vaccine each year before the flu season begins. ? Pneumonia vaccine. ? Shingles vaccine. ? Tetanus, diphtheria, and pertussis (Tdap) booster vaccine. Your health care provider may also recommend other immunizations. Tell your health care provider if you have ever been abused or do not feel safe at home. Summary  Menopause is a normal process in which your ability to get  pregnant comes to an end.  This condition causes hot flashes, night sweats, decreased interest in sex, mood swings, headaches, or lack of sleep.  Treatment for this condition may include hormone replacement therapy.  Take actions to keep yourself healthy, including exercising regularly, eating a healthy diet, watching your weight, and checking your blood pressure and blood sugar levels.  Get screened for cancer and depression. Make sure that you are up to date with all your vaccines. This information is not intended to replace advice given to you by your health care provider. Make sure you discuss any questions you have with your health care provider. Document Released: 11/27/2005 Document Revised: 09/28/2018 Document Reviewed: 09/28/2018 Elsevier Patient Education  2020 [employer].

## 2019-06-23 NOTE — Telephone Encounter (Signed)
Records faxed to [place] - Release [id number]

## 2019-06-23 NOTE — Progress Notes (Signed)
This visit was conducted in person.  BP 118/68 (BP Location: Right Arm, Patient Position: Sitting, Cuff Size: Large)   Pulse 74   Temp 98.2 F (36.8 C) (Temporal)   Ht 4' 10"  (1.473 m)   Wt 158 lb 2 oz (71.7 kg)   LMP 03/04/2009 (Approximate)   SpO2 100%   BMI 33.05 kg/m    CC: CPE Subjective:    Patient ID: Melanie Holloway, female    DOB: [birth year], 53 y.o.   MRN: [id number]  HPI: Melanie Holloway is a 53 y.o. female presenting on 06/23/2019 for Annual Exam (Pt accompanied by her daughter, Heide Guile. )   Regularly followed by cards ([facility]) and onc ([facility]). Also sees nephrology [facility]) and rheumatology Jefm Bryant).   Recent unfortunate diagnosis of newly metastatic breast cancer (ER positive, PR negative, HER2 negative) found in spine by L2 vertebral biopsy. Planned to start on fulvestrant and palbociclib chemo this month - having hesitations regarding cost and treatment.   H/o SLE, nonischemic dilated CM, chronic sCHF, and breast cancer metastatic to bone alone.   Alendronate started 04/2019. DEXA 2013 - spine 0.4, hip 0.2  Requests skin tag removed from R anterior thigh - will return for this.   Prevenative: Colon cancer screening - again reviewed. Plan was colonoscopy but has been postponed first due to pandemic and now due to recent metastatic breast cancer dx. Agrees to iFOB.  Well woman [place]- with Dr. Kennon Rounds Normal pap smear 08/2018. DEXA 2013 WNL - spine 0.4, hip 0.2 LMP 2009 - chemo led to early menopause Breast cancer - 2011 s/p treatment - see above for newly noted mets from primary breast cancer.  Flu shot - yearly - declines today Tdap 2016 Seat belt use discussed.  Sunscreen use discussed. No changing moles on skin.  Non smoker Alcohol - none Dentist - doesn't see  Eye exam - yearly   Caffeine: occasional coffee and soda  Lives with husband and 74 yo child  Occupation: business Surveyor, minerals  Activity: walks with cardiac rehab Diet:  good water, fruits/vegetables daily     Relevant past medical, surgical, family and social history reviewed and updated as indicated. Interim medical history since our last visit reviewed. Allergies and medications reviewed and updated. Outpatient Medications Prior to Visit  Medication Sig Dispense Refill  . acetaminophen (TYLENOL) 325 MG tablet Take 2 tablets (650 mg total) by mouth every 4 (four) hours as needed for headache or mild pain. 30 tablet 0  . alendronate (FOSAMAX) 70 MG tablet Take 1 tablet (70 mg total) by mouth once a week. Take with a full glass of water on an empty stomach. 5 tablet 6  . Cholecalciferol (VITAMIN D) 2000 UNITS CAPS Take 1 capsule (2,000 Units total) by mouth daily. 30 capsule   . clobetasol cream (TEMOVATE) 5.73 % Apply 1 application topically 2 (two) times daily.    Marland Kitchen ENTRESTO 24-26 MG TAKE 1 TABLET BY MOUTH TWICE A DAY 180 tablet 3  . hydroxychloroquine (PLAQUENIL) 200 MG tablet Take 1 tablet (200 mg total) by mouth daily.    Marland Kitchen letrozole (FEMARA) 2.5 MG tablet Take 2.5 mg by mouth daily.    . metoprolol succinate (TOPROL-XL) 50 MG 24 hr tablet Take 1 tablet (50 mg total) by mouth daily. Take with or immediately following a meal. 30 tablet 0  . mycophenolate (CELLCEPT) 250 MG capsule Take 2 capsules (500 mg total) by mouth 2 (two) times daily. 60 capsule 0  . potassium chloride (  K-DUR) 10 MEQ tablet Take 10 mEq by mouth daily.    Marland Kitchen torsemide (DEMADEX) 20 MG tablet Take 2 tablets (40 mg total) by mouth 2 (two) times daily. 60 tablet 0  . palbociclib (IBRANCE) 125 MG tablet Take 1 tablet (125 mg total) by mouth daily. Take for 21 days on, 7 days off, repeat every 28 days. (Patient not taking: Reported on 06/23/2019) 21 tablet 6   No facility-administered medications prior to visit.      Per HPI unless specifically indicated in ROS section below Review of Systems  Constitutional: Negative for activity change, appetite change, chills, fatigue, fever and unexpected  weight change.  HENT: Negative for hearing loss.   Eyes: Negative for visual disturbance.  Respiratory: Negative for cough, chest tightness, shortness of breath and wheezing.   Cardiovascular: Positive for leg swelling (at ankles). Negative for chest pain and palpitations.  Gastrointestinal: Negative for abdominal distention, abdominal pain, blood in stool, constipation, diarrhea, nausea and vomiting.  Genitourinary: Negative for difficulty urinating and hematuria.  Musculoskeletal: Negative for arthralgias, myalgias and neck pain.  Skin: Negative for rash.  Neurological: Negative for dizziness, seizures, syncope and headaches.  Hematological: Negative for adenopathy. Does not bruise/bleed easily.  Psychiatric/Behavioral: Negative for dysphoric mood. The patient is not nervous/anxious.    Objective:    BP 118/68 (BP Location: Right Arm, Patient Position: Sitting, Cuff Size: Large)   Pulse 74   Temp 98.2 F (36.8 C) (Temporal)   Ht 4' 10"  (1.473 m)   Wt 158 lb 2 oz (71.7 kg)   LMP 03/04/2009 (Approximate)   SpO2 100%   BMI 33.05 kg/m   Wt Readings from Last 3 Encounters:  06/23/19 158 lb 2 oz (71.7 kg)  06/14/19 161 lb 14.4 oz (73.4 kg)  06/09/19 160 lb (72.6 kg)    Physical Exam Vitals signs and nursing note reviewed.  Constitutional:      General: She is not in acute distress.    Appearance: Normal appearance. She is well-developed. She is obese. She is not ill-appearing.  HENT:     Head: Normocephalic and atraumatic.     Right Ear: Hearing, tympanic membrane, ear canal and external ear normal.     Left Ear: Hearing, tympanic membrane, ear canal and external ear normal.     Nose: Nose normal.     Mouth/Throat:     Mouth: Mucous membranes are moist.     Pharynx: Uvula midline. No oropharyngeal exudate or posterior oropharyngeal erythema.  Eyes:     General: No scleral icterus.    Extraocular Movements: Extraocular movements intact.     Conjunctiva/sclera: Conjunctivae  normal.     Pupils: Pupils are equal, round, and reactive to light.  Neck:     Musculoskeletal: Normal range of motion and neck supple.  Cardiovascular:     Rate and Rhythm: Normal rate and regular rhythm.     Pulses: Normal pulses.          Radial pulses are 2+ on the right side and 2+ on the left side.     Heart sounds: Normal heart sounds. No murmur.  Pulmonary:     Effort: Pulmonary effort is normal. No respiratory distress.     Breath sounds: Normal breath sounds. No wheezing, rhonchi or rales.  Abdominal:     General: Abdomen is flat. Bowel sounds are normal. There is no distension.     Palpations: Abdomen is soft. There is no mass.     Tenderness: There is no  abdominal tenderness. There is no guarding or rebound.     Hernia: No hernia is present.  Musculoskeletal: Normal range of motion.  Lymphadenopathy:     Cervical: No cervical adenopathy.  Skin:    General: Skin is warm and dry.     Findings: No rash.     Comments: Skin tag anterior R thigh  Neurological:     General: No focal deficit present.     Mental Status: She is alert and oriented to person, place, and time.     Comments: CN grossly intact, station and gait intact  Psychiatric:        Mood and Affect: Mood normal.        Behavior: Behavior normal.        Thought Content: Thought content normal.        Judgment: Judgment normal.       Results for orders placed or performed in visit on 06/23/19  Lipid panel  Result Value Ref Range   Cholesterol 174 0 - 200 mg/dL   Triglycerides 146.0 0.0 - 149.0 mg/dL   HDL 39.00 (L) >39.00 mg/dL   VLDL 29.2 0.0 - 40.0 mg/dL   LDL Cholesterol 106 (H) 0 - 99 mg/dL   Total CHOL/HDL Ratio 4    NonHDL 134.92   TSH  Result Value Ref Range   TSH 3.13 0.35 - 4.50 uIU/mL  VITAMIN D 25 Hydroxy (Vit-D Deficiency, Fractures)  Result Value Ref Range   VITD 47.11 30.00 - 100.00 ng/mL   Lab Results  Component Value Date   CREATININE 1.81 (H) 06/14/2019   BUN 59 (H) 06/14/2019    NA 141 06/14/2019   K 4.0 06/14/2019   CL 104 06/14/2019   CO2 26 06/14/2019    Lab Results  Component Value Date   WBC 3.0 (L) 06/14/2019   HGB 9.1 (L) 06/14/2019   HCT 26.7 (L) 06/14/2019   MCV 85.6 06/14/2019   PLT 173 06/14/2019    Assessment & Plan:   Problem List Items Addressed This Visit    Vitamin D deficiency    Levels repleted with replacement.       Relevant Orders   VITAMIN D 25 Hydroxy (Vit-D Deficiency, Fractures) (Completed)   Systemic lupus erythematosus ([place])    Appreciate rheum care.       Obesity, Class I, BMI 30.0-34.9 (see actual BMI)   Malignant neoplasm of lower-outer quadrant of left breast of female, estrogen receptor positive ([place])    Newly diagnosed mets, has onc f/u. Encouraged consider pursuing treatment.       Relevant Medications   letrozole (FEMARA) 2.5 MG tablet   Lupus nephritis, ISN/RPS class V ([place])    Appreciate nephrology care.       Lupus erythematosus tumidus    Continue TCI.       Leukopenia   Health maintenance examination - Primary    Preventative protocols reviewed and updated unless pt declined. Discussed healthy diet and lifestyle.       Essential hypertension    Chronic, stable on current regimen.       Dyslipidemia    Chronic, off statin. No known h/o stroke or MI.  The ASCVD Risk score Mikey Bussing DC Jr., et al., 2013) failed to calculate for the following reasons:   The patient has a prior MI or stroke diagnosis       Relevant Orders   Lipid panel (Completed)   TSH (Completed)   Chronic pain of left ankle  Ongoing without significant improvement despite ASO brace started 02/2019. Ho ankle fracture with hardware use. See prior note from 02/28/2019. Will offer ortho eval.       Chronic HFrEF (heart failure with reduced ejection fraction) ([place])    Continue cards f/u. [facility] [phone]      Bone metastases ([place])    New dx. Considering treatment options.       Relevant Medications    letrozole (FEMARA) 2.5 MG tablet   Anemia    Chronic, multifactorial including h/o beta thalassemia per chart.       Other Visit Diagnoses    Special screening for malignant neoplasms, colon       Relevant Orders   Fecal occult blood, imunochemical       No orders of the defined types were placed in this encounter.  Orders Placed This Encounter  Procedures  . Fecal occult blood, imunochemical    Standing Status:   Future    Standing Expiration Date:   06/22/2020  . Lipid panel  . TSH  . VITAMIN D 25 Hydroxy (Vit-D Deficiency, Fractures)    Patient instructions: Pass by lab for stool kit Return at your convenience for skin tag removal  Get flu shot later this season, as soon as you can.  Keep follow up with oncology   Follow up plan: Return in about 3 months (around 09/22/2019) for follow up visit.  Ria Bush, MD

## 2019-06-26 NOTE — Assessment & Plan Note (Signed)
Continue cards f/u. [facility] [phone]

## 2019-06-26 NOTE — Assessment & Plan Note (Signed)
Ongoing without significant improvement despite ASO brace started 02/2019. Ho ankle fracture with hardware use. See prior note from 02/28/2019. Will offer ortho eval.

## 2019-06-26 NOTE — Assessment & Plan Note (Signed)
Chronic, stable on current regimen.  

## 2019-06-26 NOTE — Assessment & Plan Note (Addendum)
Chronic, off statin. No known h/o stroke or MI.  The ASCVD Risk score Mikey Bussing DC Jr., et al., 2013) failed to calculate for the following reasons:   The patient has a prior MI or stroke diagnosis

## 2019-06-26 NOTE — Assessment & Plan Note (Signed)
Chronic, multifactorial including h/o beta thalassemia per chart.

## 2019-06-26 NOTE — Assessment & Plan Note (Signed)
Levels repleted with replacement.

## 2019-06-26 NOTE — Assessment & Plan Note (Signed)
Continue TCI.

## 2019-06-26 NOTE — Assessment & Plan Note (Signed)
Newly diagnosed mets, has onc f/u. Encouraged consider pursuing treatment.

## 2019-06-26 NOTE — Assessment & Plan Note (Signed)
Preventative protocols reviewed and updated unless pt declined. Discussed healthy diet and lifestyle.  
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
## 2019-06-26 NOTE — Assessment & Plan Note (Signed)
Appreciate rheum care.

## 2019-06-26 NOTE — Assessment & Plan Note (Signed)
Appreciate nephrology care.  

## 2019-06-26 NOTE — Assessment & Plan Note (Signed)
New dx. Considering treatment options.

## 2019-06-27 ENCOUNTER — Other Ambulatory Visit: Payer: Self-pay | Admitting: Oncology

## 2019-06-28 ENCOUNTER — Encounter: Payer: Self-pay | Admitting: Family

## 2019-06-28 ENCOUNTER — Other Ambulatory Visit: Payer: Self-pay

## 2019-06-28 ENCOUNTER — Ambulatory Visit: Payer: 59 | Attending: Family | Admitting: Family

## 2019-06-28 VITALS — BP 119/74 | HR 73 | Resp 18 | Ht 59.0 in | Wt 158.5 lb

## 2019-06-28 DIAGNOSIS — Z923 Personal history of irradiation: Secondary | ICD-10-CM | POA: Insufficient documentation

## 2019-06-28 DIAGNOSIS — Z79899 Other long term (current) drug therapy: Secondary | ICD-10-CM | POA: Diagnosis not present

## 2019-06-28 DIAGNOSIS — I10 Essential (primary) hypertension: Secondary | ICD-10-CM

## 2019-06-28 DIAGNOSIS — Z9221 Personal history of antineoplastic chemotherapy: Secondary | ICD-10-CM | POA: Diagnosis not present

## 2019-06-28 DIAGNOSIS — I130 Hypertensive heart and chronic kidney disease with heart failure and stage 1 through stage 4 chronic kidney disease, or unspecified chronic kidney disease: Secondary | ICD-10-CM | POA: Diagnosis present

## 2019-06-28 DIAGNOSIS — Z9012 Acquired absence of left breast and nipple: Secondary | ICD-10-CM | POA: Insufficient documentation

## 2019-06-28 DIAGNOSIS — I5022 Chronic systolic (congestive) heart failure: Secondary | ICD-10-CM | POA: Insufficient documentation

## 2019-06-28 DIAGNOSIS — Z833 Family history of diabetes mellitus: Secondary | ICD-10-CM | POA: Insufficient documentation

## 2019-06-28 DIAGNOSIS — Z6832 Body mass index (BMI) 32.0-32.9, adult: Secondary | ICD-10-CM | POA: Diagnosis not present

## 2019-06-28 DIAGNOSIS — E559 Vitamin D deficiency, unspecified: Secondary | ICD-10-CM | POA: Diagnosis not present

## 2019-06-28 DIAGNOSIS — E669 Obesity, unspecified: Secondary | ICD-10-CM | POA: Diagnosis not present

## 2019-06-28 DIAGNOSIS — Z79811 Long term (current) use of aromatase inhibitors: Secondary | ICD-10-CM | POA: Insufficient documentation

## 2019-06-28 DIAGNOSIS — N189 Chronic kidney disease, unspecified: Secondary | ICD-10-CM | POA: Insufficient documentation

## 2019-06-28 DIAGNOSIS — Z803 Family history of malignant neoplasm of breast: Secondary | ICD-10-CM | POA: Insufficient documentation

## 2019-06-28 DIAGNOSIS — Z853 Personal history of malignant neoplasm of breast: Secondary | ICD-10-CM | POA: Diagnosis not present

## 2019-06-28 DIAGNOSIS — M3214 Glomerular disease in systemic lupus erythematosus: Secondary | ICD-10-CM | POA: Diagnosis not present

## 2019-06-28 NOTE — Telephone Encounter (Signed)
I spoke with Melanie Holloway last week and she is wanting to talk further with Dr Jannifer Rodney at her scheduled appointment next week.  She has a lot of question and concerns- that she is very guarded in discussing openly.

## 2019-06-28 NOTE — Telephone Encounter (Signed)
Oral Oncology Patient Advocate Encounter  I followed up with [facility] today and the patient still has not returned their call to schedule shipment of her Ibrance.  Melanie Holloway Patient [place] Phone [phone] Fax [phone] 06/28/2019   2:52 PM

## 2019-06-28 NOTE — Telephone Encounter (Signed)
Val.  Perhaps you could f/u with patient sometime this week?

## 2019-06-28 NOTE — Telephone Encounter (Signed)
Thanks Val.  Wanted to make sure she isn't getting lost to f/u.    Appreciate it.

## 2019-06-28 NOTE — Patient Instructions (Signed)
Continue weighing daily and call for an overnight weight gain of > 2 pounds or a weekly weight gain of >5 pounds. 

## 2019-07-03 ENCOUNTER — Encounter: Payer: Self-pay | Admitting: *Deleted
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
## 2019-07-03 NOTE — Progress Notes (Signed)
[place] Clinical Social Work  Holiday representative contacted patient at home to confirm meeting tomorrow to discuss resources.  Patient stated she "had someone bringing her to her appointment, and did not want them to have to wait any longer than necessary."  CSW and patient discussed support and financial resources.  CSW left a packet with applications and resources with RN.  RN will give to patient at her appointment tomorrow 07/04/19.  RN will contact CSW if patient is able to complete information for the [facility] after her appointment.     Johnnye Lana, MSW, LCSW, OSW-C Clinical Social Worker [facility] [phone]

## 2019-07-04 ENCOUNTER — Inpatient Hospital Stay: Payer: No Typology Code available for payment source | Attending: Oncology

## 2019-07-04 ENCOUNTER — Other Ambulatory Visit: Payer: Self-pay

## 2019-07-04 ENCOUNTER — Encounter: Payer: Self-pay | Admitting: *Deleted

## 2019-07-04 ENCOUNTER — Inpatient Hospital Stay: Payer: No Typology Code available for payment source

## 2019-07-04 ENCOUNTER — Inpatient Hospital Stay (HOSPITAL_BASED_OUTPATIENT_CLINIC_OR_DEPARTMENT_OTHER): Payer: No Typology Code available for payment source | Admitting: Oncology

## 2019-07-04 VITALS — BP 138/91 | HR 78 | Temp 98.5°F | Resp 18 | Ht 59.0 in | Wt 158.4 lb

## 2019-07-04 DIAGNOSIS — C7951 Secondary malignant neoplasm of bone: Secondary | ICD-10-CM

## 2019-07-04 DIAGNOSIS — M3214 Glomerular disease in systemic lupus erythematosus: Secondary | ICD-10-CM | POA: Insufficient documentation

## 2019-07-04 DIAGNOSIS — Z170 Estrogen receptor positive status [ER+]: Secondary | ICD-10-CM | POA: Insufficient documentation

## 2019-07-04 DIAGNOSIS — D649 Anemia, unspecified: Secondary | ICD-10-CM | POA: Insufficient documentation

## 2019-07-04 DIAGNOSIS — Z9012 Acquired absence of left breast and nipple: Secondary | ICD-10-CM | POA: Diagnosis not present

## 2019-07-04 DIAGNOSIS — Z9221 Personal history of antineoplastic chemotherapy: Secondary | ICD-10-CM | POA: Insufficient documentation

## 2019-07-04 DIAGNOSIS — I10 Essential (primary) hypertension: Secondary | ICD-10-CM | POA: Diagnosis not present

## 2019-07-04 DIAGNOSIS — Z79811 Long term (current) use of aromatase inhibitors: Secondary | ICD-10-CM | POA: Diagnosis not present

## 2019-07-04 DIAGNOSIS — C771 Secondary and unspecified malignant neoplasm of intrathoracic lymph nodes: Secondary | ICD-10-CM | POA: Diagnosis not present

## 2019-07-04 DIAGNOSIS — N189 Chronic kidney disease, unspecified: Secondary | ICD-10-CM | POA: Insufficient documentation

## 2019-07-04 DIAGNOSIS — Z923 Personal history of irradiation: Secondary | ICD-10-CM | POA: Insufficient documentation

## 2019-07-04 DIAGNOSIS — C50512 Malignant neoplasm of lower-outer quadrant of left female breast: Secondary | ICD-10-CM

## 2019-07-04 DIAGNOSIS — Z7189 Other specified counseling: Secondary | ICD-10-CM

## 2019-07-04 LAB — COMPREHENSIVE METABOLIC PANEL
ALT: 14 U/L (ref 0–44)
AST: 21 U/L (ref 15–41)
Albumin: 4.1 g/dL (ref 3.5–5.0)
Alkaline Phosphatase: 95 U/L (ref 38–126)
Anion gap: 11 (ref 5–15)
BUN: 72 mg/dL — ABNORMAL HIGH (ref 6–20)
CO2: 23 mmol/L (ref 22–32)
Calcium: 9 mg/dL (ref 8.9–10.3)
Chloride: 106 mmol/L (ref 98–111)
Creatinine, Ser: 1.73 mg/dL — ABNORMAL HIGH (ref 0.44–1.00)
GFR calc Af Amer: 38 mL/min — ABNORMAL LOW (ref >60)
GFR calc non Af Amer: 33 mL/min — ABNORMAL LOW (ref >60)
Glucose, Bld: 91 mg/dL (ref 70–99)
Potassium: 3.9 mmol/L (ref 3.5–5.1)
Sodium: 140 mmol/L (ref 135–145)
Total Bilirubin: 0.2 mg/dL — ABNORMAL LOW (ref 0.3–1.2)
Total Protein: 7.8 g/dL (ref 6.5–8.1)

## 2019-07-04 LAB — CBC WITH DIFFERENTIAL/PLATELET
Abs Immature Granulocytes: 0.01 10*3/uL (ref 0.00–0.07)
Basophils Absolute: 0 10*3/uL (ref 0.0–0.1)
Basophils Relative: 0 %
Eosinophils Absolute: 0.1 10*3/uL (ref 0.0–0.5)
Eosinophils Relative: 2 %
HCT: 27.2 % — ABNORMAL LOW (ref 36.0–46.0)
Hemoglobin: 9.1 g/dL — ABNORMAL LOW (ref 12.0–15.0)
Immature Granulocytes: 0 %
Lymphocytes Relative: 26 %
Lymphs Abs: 0.7 10*3/uL (ref 0.7–4.0)
MCH: 29.1 pg (ref 26.0–34.0)
MCHC: 33.5 g/dL (ref 30.0–36.0)
MCV: 86.9 fL (ref 80.0–100.0)
Monocytes Absolute: 0.3 10*3/uL (ref 0.1–1.0)
Monocytes Relative: 12 %
Neutro Abs: 1.5 10*3/uL — ABNORMAL LOW (ref 1.7–7.7)
Neutrophils Relative %: 60 %
Platelets: 173 10*3/uL (ref 150–400)
RBC: 3.13 MIL/uL — ABNORMAL LOW (ref 3.87–5.11)
RDW: 12.2 % (ref 11.5–15.5)
WBC: 2.6 10*3/uL — ABNORMAL LOW (ref 4.0–10.5)
nRBC: 0 % (ref 0.0–0.2)

## 2019-07-04 MED ORDER — FULVESTRANT 250 MG/5ML IM SOLN
INTRAMUSCULAR | Status: AC
  Filled 2019-07-04: qty 10

## 2019-07-04 MED ORDER — FULVESTRANT 250 MG/5ML IM SOLN
500.0000 mg | Freq: Once | INTRAMUSCULAR | Status: AC
  Administered 2019-07-04: 500 mg via INTRAMUSCULAR

## 2019-07-04 NOTE — Patient Instructions (Signed)
Fulvestrant injection What is this medicine? FULVESTRANT (ful VES trant) blocks the effects of estrogen. It is used to treat breast cancer. This medicine may be used for other purposes; ask your health care provider or pharmacist if you have questions. COMMON BRAND NAME(S): FASLODEX What should I tell my health care provider before I take this medicine? They need to know if you have any of these conditions:  bleeding disorders  liver disease  low blood counts, like low white cell, platelet, or red cell counts  an unusual or allergic reaction to fulvestrant, other medicines, foods, dyes, or preservatives  pregnant or trying to get pregnant  breast-feeding How should I use this medicine? This medicine is for injection into a muscle. It is usually given by a health care professional in a hospital or clinic setting. Talk to your pediatrician regarding the use of this medicine in children. Special care may be needed. Overdosage: If you think you have taken too much of this medicine contact a poison control center or emergency room at once. NOTE: This medicine is only for you. Do not share this medicine with others. What if I miss a dose? It is important not to miss your dose. Call your doctor or health care professional if you are unable to keep an appointment. What may interact with this medicine?  medicines that treat or prevent blood clots like warfarin, enoxaparin, dalteparin, apixaban, dabigatran, and rivaroxaban This list may not describe all possible interactions. Give your health care provider a list of all the medicines, herbs, non-prescription drugs, or dietary supplements you use. Also tell them if you smoke, drink alcohol, or use illegal drugs. Some items may interact with your medicine. What should I watch for while using this medicine? Your condition will be monitored carefully while you are receiving this medicine. You will need important blood work done while you are taking  this medicine. Do not become pregnant while taking this medicine or for at least 1 year after stopping it. Women of child-bearing potential will need to have a negative pregnancy test before starting this medicine. Women should inform their doctor if they wish to become pregnant or think they might be pregnant. There is a potential for serious side effects to an unborn child. Men should inform their doctors if they wish to father a child. This medicine may lower sperm counts. Talk to your health care professional or pharmacist for more information. Do not breast-feed an infant while taking this medicine or for 1 year after the last dose. What side effects may I notice from receiving this medicine? Side effects that you should report to your doctor or health care professional as soon as possible:  allergic reactions like skin rash, itching or hives, swelling of the face, lips, or tongue  feeling faint or lightheaded, falls  pain, tingling, numbness, or weakness in the legs  signs and symptoms of infection like fever or chills; cough; flu-like symptoms; sore throat  vaginal bleeding Side effects that usually do not require medical attention (report to your doctor or health care professional if they continue or are bothersome):  aches, pains  constipation  diarrhea  headache  hot flashes  nausea, vomiting  pain at site where injected  stomach pain This list may not describe all possible side effects. Call your doctor for medical advice about side effects. You may report side effects to FDA at 1-800-FDA-1088. Where should I keep my medicine? This drug is given in a hospital or clinic and will   not be stored at home. NOTE: This sheet is a summary. It may not cover all possible information. If you have questions about this medicine, talk to your doctor, pharmacist, or health care provider.  2020 Elsevier/Gold Standard (2018-01-13 11:34:41)  

## 2019-07-04 NOTE — Telephone Encounter (Signed)
Oral Oncology Patient Advocate Encounter  I spoke to the patient while she was in the office today. [facility] [place] [facility].  [facility] to [facility]. [facility] [address] [facility] [place] on [facility]11. The Ibrance did arrive there yesterday.  I called the patient and gave her this information, she will go [facility], [facility].  [place] Patient [place] Phone [phone] Fax [phone] 07/04/2019   11:30 AM

## 2019-07-04 NOTE — Telephone Encounter (Signed)
Thank you. GM 

## 2019-07-04 NOTE — Progress Notes (Signed)
[place] Work  Holiday representative met with patient at [employer].  CSW and Patient reviewed applications for [place] in [place].  CSW also enrolled patient in the [facility] program, and patient was given 1st set of gift cards.  CSW provided contact information and encouraged patient to call with questions or concerns.   Johnnye Lana, MSW, LCSW, OSW-C Clinical Social Worker [facility] [phone]

## 2019-07-05 ENCOUNTER — Telehealth: Payer: Self-pay | Admitting: Oncology

## 2019-07-05 NOTE — Telephone Encounter (Signed)
I talk with patient regarding schedule  

## 2019-07-06 NOTE — Telephone Encounter (Signed)
This RN returned call to pt and obtained VM- message left call was being returned and requested pt to call again.

## 2019-07-18 ENCOUNTER — Inpatient Hospital Stay: Payer: No Typology Code available for payment source

## 2019-07-18 ENCOUNTER — Encounter: Payer: Self-pay | Admitting: Adult Health

## 2019-07-18 ENCOUNTER — Other Ambulatory Visit: Payer: Self-pay

## 2019-07-18 ENCOUNTER — Inpatient Hospital Stay (HOSPITAL_BASED_OUTPATIENT_CLINIC_OR_DEPARTMENT_OTHER): Payer: No Typology Code available for payment source | Admitting: Adult Health

## 2019-07-18 ENCOUNTER — Telehealth: Payer: Self-pay

## 2019-07-18 VITALS — BP 125/73 | HR 85 | Temp 98.5°F | Resp 20

## 2019-07-18 VITALS — BP 114/72 | HR 82 | Temp 99.1°F | Resp 20 | Ht 59.0 in | Wt 157.0 lb

## 2019-07-18 DIAGNOSIS — C7951 Secondary malignant neoplasm of bone: Secondary | ICD-10-CM

## 2019-07-18 DIAGNOSIS — C771 Secondary and unspecified malignant neoplasm of intrathoracic lymph nodes: Secondary | ICD-10-CM | POA: Diagnosis not present

## 2019-07-18 DIAGNOSIS — Z170 Estrogen receptor positive status [ER+]: Secondary | ICD-10-CM

## 2019-07-18 DIAGNOSIS — C50512 Malignant neoplasm of lower-outer quadrant of left female breast: Secondary | ICD-10-CM | POA: Diagnosis not present

## 2019-07-18 LAB — CBC WITH DIFFERENTIAL/PLATELET
Abs Immature Granulocytes: 0 10*3/uL (ref 0.00–0.07)
Basophils Absolute: 0 10*3/uL (ref 0.0–0.1)
Basophils Relative: 1 %
Eosinophils Absolute: 0 10*3/uL (ref 0.0–0.5)
Eosinophils Relative: 1 %
HCT: 24.3 % — ABNORMAL LOW (ref 36.0–46.0)
Hemoglobin: 8 g/dL — ABNORMAL LOW (ref 12.0–15.0)
Immature Granulocytes: 0 %
Lymphocytes Relative: 48 %
Lymphs Abs: 0.5 10*3/uL — ABNORMAL LOW (ref 0.7–4.0)
MCH: 28.9 pg (ref 26.0–34.0)
MCHC: 32.9 g/dL (ref 30.0–36.0)
MCV: 87.7 fL (ref 80.0–100.0)
Monocytes Absolute: 0 10*3/uL — ABNORMAL LOW (ref 0.1–1.0)
Monocytes Relative: 4 %
Neutro Abs: 0.5 10*3/uL — ABNORMAL LOW (ref 1.7–7.7)
Neutrophils Relative %: 46 %
Platelets: 74 10*3/uL — ABNORMAL LOW (ref 150–400)
RBC: 2.77 MIL/uL — ABNORMAL LOW (ref 3.87–5.11)
RDW: 11.8 % (ref 11.5–15.5)
WBC: 1.1 10*3/uL — ABNORMAL LOW (ref 4.0–10.5)
nRBC: 0 % (ref 0.0–0.2)

## 2019-07-18 LAB — COMPREHENSIVE METABOLIC PANEL
ALT: 12 U/L (ref 0–44)
AST: 16 U/L (ref 15–41)
Albumin: 3.9 g/dL (ref 3.5–5.0)
Alkaline Phosphatase: 87 U/L (ref 38–126)
Anion gap: 12 (ref 5–15)
BUN: 67 mg/dL — ABNORMAL HIGH (ref 6–20)
CO2: 23 mmol/L (ref 22–32)
Calcium: 8.6 mg/dL — ABNORMAL LOW (ref 8.9–10.3)
Chloride: 108 mmol/L (ref 98–111)
Creatinine, Ser: 2.84 mg/dL — ABNORMAL HIGH (ref 0.44–1.00)
GFR calc Af Amer: 21 mL/min — ABNORMAL LOW (ref >60)
GFR calc non Af Amer: 18 mL/min — ABNORMAL LOW (ref >60)
Glucose, Bld: 114 mg/dL — ABNORMAL HIGH (ref 70–99)
Potassium: 3.5 mmol/L (ref 3.5–5.1)
Sodium: 143 mmol/L (ref 135–145)
Total Bilirubin: 0.3 mg/dL (ref 0.3–1.2)
Total Protein: 7.1 g/dL (ref 6.5–8.1)

## 2019-07-18 MED ORDER — FULVESTRANT 250 MG/5ML IM SOLN
500.0000 mg | Freq: Once | INTRAMUSCULAR | Status: AC
  Administered 2019-07-18: 15:00:00 500 mg via INTRAMUSCULAR

## 2019-07-18 MED ORDER — SODIUM CHLORIDE 0.9 % IV SOLN
INTRAVENOUS | Status: AC
  Filled 2019-07-18: qty 250

## 2019-07-18 MED ORDER — SODIUM CHLORIDE 0.9 % IV SOLN
Freq: Once | INTRAVENOUS | Status: AC
  Administered 2019-07-18: 14:00:00 via INTRAVENOUS
  Filled 2019-07-18: qty 250

## 2019-07-18 MED ORDER — FULVESTRANT 250 MG/5ML IM SOLN
INTRAMUSCULAR | Status: AC
  Filled 2019-07-18: qty 5

## 2019-07-18 NOTE — Telephone Encounter (Signed)
Per Mendel Ryder OK to run fluids at 999 ml/hr
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
## 2019-07-18 NOTE — Patient Instructions (Signed)
Rehydration, Adult Rehydration is the replacement of body fluids and salts and minerals (electrolytes) that are lost during dehydration. Dehydration is when there is not enough fluid or water in the body. This happens when you lose more fluids than you take in. Common causes of dehydration include:  Vomiting.  Diarrhea.  Excessive sweating, such as from heat exposure or exercise.  Taking medicines that cause the body to lose excess fluid (diuretics).  Impaired kidney function.  Not drinking enough fluid.  Certain illnesses or infections.  Certain poorly controlled long-term (chronic) illnesses, such as diabetes, heart disease, and kidney disease.  Symptoms of mild dehydration may include thirst, dry lips and mouth, dry skin, and dizziness. Symptoms of severe dehydration may include increased heart rate, confusion, fainting, and not urinating. You can rehydrate by drinking certain fluids or getting fluids through an IV tube, as told by your health care provider. What are the risks? Generally, rehydration is safe. However, one problem that can happen is taking in too much fluid (overhydration). This is rare. If overhydration happens, it can cause an electrolyte imbalance, kidney failure, or a decrease in salt (sodium) levels in the body. How to rehydrate Follow instructions from your health care provider for rehydration. The kind of fluid you should drink and the amount you should drink depend on your condition.  If directed by your health care provider, drink an oral rehydration solution (ORS). This is a drink designed to treat dehydration that is found in pharmacies and retail stores. ? Make an ORS by following instructions on the package. ? Start by drinking small amounts, about  cup (120 mL) every 5-10 minutes. ? Slowly increase how much you drink until you have taken the amount recommended by your health care provider.  Drink enough clear fluids to keep your urine clear or pale  yellow. If you were instructed to drink an ORS, finish the ORS first, then start slowly drinking other clear fluids. Drink fluids such as: ? Water. Do not drink only water. Doing that can lead to having too little sodium in your body (hyponatremia). ? Ice chips. ? Fruit juice that you have added water to (diluted juice). ? Low-calorie sports drinks.  If you are severely dehydrated, your health care provider may recommend that you receive fluids through an IV tube in the hospital.  Do not take sodium tablets. Doing that can lead to the condition of having too much sodium in your body (hypernatremia). Eating while you rehydrate Follow instructions from your health care provider about what to eat while you rehydrate. Your health care provider may recommend that you slowly begin eating regular foods in small amounts.  Eat foods that contain a healthy balance of electrolytes, such as bananas, oranges, potatoes, tomatoes, and spinach.  Avoid foods that are greasy or contain a lot of fat or sugar.  In some cases, you may get nutrition through a feeding tube that is passed through your nose and into your stomach (nasogastric tube, or NG tube). This may be done if you have uncontrolled vomiting or diarrhea. Beverages to avoid Certain beverages may make dehydration worse. While you rehydrate, avoid:  Alcohol.  Caffeine.  Drinks that contain a lot of sugar. These include: ? High-calorie sports drinks. ? Fruit juice that is not diluted. ? Soda.  Check nutrition labels to see how much sugar or caffeine a beverage contains. Signs of dehydration recovery You may be recovering from dehydration if:  You are urinating more often than before you started   rehydrating.  Your urine is clear or pale yellow.  Your energy level improves.  You vomit less frequently.  You have diarrhea less frequently.  Your appetite improves or returns to normal.  You feel less dizzy or less light-headed.  Your  skin tone and color start to look more normal. Contact a health care provider if:  You continue to have symptoms of mild dehydration, such as: ? Thirst. ? Dry lips. ? Slightly dry mouth. ? Dry, warm skin. ? Dizziness.  You continue to vomit or have diarrhea. Get help right away if:  You have symptoms of dehydration that get worse.  You feel: ? Confused. ? Weak. ? Like you are going to faint.  You have not urinated in 6-8 hours.  You have very dark urine.  You have trouble breathing.  Your heart rate while sitting still is over 100 beats a minute.  You cannot drink fluids without vomiting.  You have vomiting or diarrhea that: ? Gets worse. ? Does not go away.  You have a fever. This information is not intended to replace advice given to you by your health care provider. Make sure you discuss any questions you have with your health care provider. Document Released: 12/28/2011 Document Revised: 09/17/2017 Document Reviewed: 11/29/2015 Elsevier Patient Education  [place]. Fulvestrant injection What is this medicine? FULVESTRANT (ful VES trant) blocks the effects of estrogen. It is used to treat breast cancer. This medicine may be used for other purposes; ask your health care provider or pharmacist if you have questions. COMMON BRAND NAME(S): FASLODEX What should I tell my health care provider before I take this medicine? They need to know if you have any of these conditions:  bleeding disorders  liver disease  low blood counts, like low white cell, platelet, or red cell counts  an unusual or allergic reaction to fulvestrant, other medicines, foods, dyes, or preservatives  pregnant or trying to get pregnant  breast-feeding How should I use this medicine? This medicine is for injection into a muscle. It is usually given by a health care professional in a hospital or clinic setting. Talk to your pediatrician regarding the use of this medicine in  children. Special care may be needed. Overdosage: If you think you have taken too much of this medicine contact a poison control center or emergency room at once. NOTE: This medicine is only for you. Do not share this medicine with others. What if I miss a dose? It is important not to miss your dose. Call your doctor or health care professional if you are unable to keep an appointment. What may interact with this medicine?  medicines that treat or prevent blood clots like warfarin, enoxaparin, dalteparin, apixaban, dabigatran, and rivaroxaban This list may not describe all possible interactions. Give your health care provider a list of all the medicines, herbs, non-prescription drugs, or dietary supplements you use. Also tell them if you smoke, drink alcohol, or use illegal drugs. Some items may interact with your medicine. What should I watch for while using this medicine? Your condition will be monitored carefully while you are receiving this medicine. You will need important blood work done while you are taking this medicine. Do not become pregnant while taking this medicine or for at least 1 year after stopping it. Women of child-bearing potential will need to have a negative pregnancy test before starting this medicine. Women should inform their doctor if they wish to become pregnant or think they might be pregnant.  There is a potential for serious side effects to an unborn child. Men should inform their doctors if they wish to father a child. This medicine may lower sperm counts. Talk to your health care professional or pharmacist for more information. Do not breast-feed an infant while taking this medicine or for 1 year after the last dose. What side effects may I notice from receiving this medicine? Side effects that you should report to your doctor or health care professional as soon as possible:  allergic reactions like skin rash, itching or hives, swelling of the face, lips, or tongue   feeling faint or lightheaded, falls  pain, tingling, numbness, or weakness in the legs  signs and symptoms of infection like fever or chills; cough; flu-like symptoms; sore throat  vaginal bleeding Side effects that usually do not require medical attention (report to your doctor or health care professional if they continue or are bothersome):  aches, pains  constipation  diarrhea  headache  hot flashes  nausea, vomiting  pain at site where injected  stomach pain This list may not describe all possible side effects. Call your doctor for medical advice about side effects. You may report side effects to FDA at 1-800-FDA-1088. Where should I keep my medicine? This drug is given in a hospital or clinic and will not be stored at home. NOTE: This sheet is a summary. It may not cover all possible information. If you have questions about this medicine, talk to your doctor, pharmacist, or health care provider.  2020 Elsevier/Gold Standard (2018-01-13 11:34:41)

## 2019-07-19 ENCOUNTER — Telehealth: Payer: Self-pay | Admitting: Adult Health

## 2019-07-19 NOTE — Telephone Encounter (Signed)
Called pt per 9/29 shc message. Left message for patient to call back and reschedule - no availability per pt request.

## 2019-07-27 ENCOUNTER — Ambulatory Visit: Payer: 59 | Admitting: Adult Health

## 2019-08-01 ENCOUNTER — Other Ambulatory Visit: Payer: 59

## 2019-08-01 ENCOUNTER — Ambulatory Visit: Payer: 59

## 2019-08-01 ENCOUNTER — Ambulatory Visit: Payer: 59 | Admitting: Adult Health

## 2019-08-02 ENCOUNTER — Encounter: Payer: Self-pay | Admitting: Radiology

## 2019-08-03 ENCOUNTER — Telehealth: Payer: Self-pay | Admitting: Adult Health

## 2019-08-03 ENCOUNTER — Other Ambulatory Visit: Payer: Self-pay

## 2019-08-03 ENCOUNTER — Inpatient Hospital Stay: Payer: No Typology Code available for payment source | Attending: Oncology

## 2019-08-03 ENCOUNTER — Encounter: Payer: Self-pay | Admitting: Adult Health

## 2019-08-03 ENCOUNTER — Inpatient Hospital Stay (HOSPITAL_BASED_OUTPATIENT_CLINIC_OR_DEPARTMENT_OTHER): Payer: No Typology Code available for payment source | Admitting: Adult Health

## 2019-08-03 ENCOUNTER — Inpatient Hospital Stay: Payer: No Typology Code available for payment source

## 2019-08-03 VITALS — BP 135/76 | HR 86 | Temp 98.5°F | Resp 18 | Ht 59.0 in | Wt 158.8 lb

## 2019-08-03 VITALS — BP 128/78 | HR 79 | Temp 98.5°F | Resp 18

## 2019-08-03 DIAGNOSIS — R197 Diarrhea, unspecified: Secondary | ICD-10-CM | POA: Insufficient documentation

## 2019-08-03 DIAGNOSIS — Z923 Personal history of irradiation: Secondary | ICD-10-CM | POA: Diagnosis not present

## 2019-08-03 DIAGNOSIS — Z1231 Encounter for screening mammogram for malignant neoplasm of breast: Secondary | ICD-10-CM

## 2019-08-03 DIAGNOSIS — Z9221 Personal history of antineoplastic chemotherapy: Secondary | ICD-10-CM | POA: Insufficient documentation

## 2019-08-03 DIAGNOSIS — M3214 Glomerular disease in systemic lupus erythematosus: Secondary | ICD-10-CM | POA: Diagnosis not present

## 2019-08-03 DIAGNOSIS — C50512 Malignant neoplasm of lower-outer quadrant of left female breast: Secondary | ICD-10-CM

## 2019-08-03 DIAGNOSIS — C7951 Secondary malignant neoplasm of bone: Secondary | ICD-10-CM

## 2019-08-03 DIAGNOSIS — Z170 Estrogen receptor positive status [ER+]: Secondary | ICD-10-CM

## 2019-08-03 DIAGNOSIS — D649 Anemia, unspecified: Secondary | ICD-10-CM | POA: Insufficient documentation

## 2019-08-03 DIAGNOSIS — Z79899 Other long term (current) drug therapy: Secondary | ICD-10-CM | POA: Diagnosis not present

## 2019-08-03 DIAGNOSIS — Z5111 Encounter for antineoplastic chemotherapy: Secondary | ICD-10-CM | POA: Diagnosis present

## 2019-08-03 LAB — CBC WITH DIFFERENTIAL/PLATELET
Abs Immature Granulocytes: 0 10*3/uL (ref 0.00–0.07)
Basophils Absolute: 0 10*3/uL (ref 0.0–0.1)
Basophils Relative: 1 %
Eosinophils Absolute: 0 10*3/uL (ref 0.0–0.5)
Eosinophils Relative: 1 %
HCT: 22.6 % — ABNORMAL LOW (ref 36.0–46.0)
Hemoglobin: 7.5 g/dL — ABNORMAL LOW (ref 12.0–15.0)
Immature Granulocytes: 0 %
Lymphocytes Relative: 22 %
Lymphs Abs: 0.5 10*3/uL — ABNORMAL LOW (ref 0.7–4.0)
MCH: 28.8 pg (ref 26.0–34.0)
MCHC: 33.2 g/dL (ref 30.0–36.0)
MCV: 86.9 fL (ref 80.0–100.0)
Monocytes Absolute: 0.5 10*3/uL (ref 0.1–1.0)
Monocytes Relative: 23 %
Neutro Abs: 1.3 10*3/uL — ABNORMAL LOW (ref 1.7–7.7)
Neutrophils Relative %: 53 %
Platelets: 198 10*3/uL (ref 150–400)
RBC: 2.6 MIL/uL — ABNORMAL LOW (ref 3.87–5.11)
RDW: 12.5 % (ref 11.5–15.5)
WBC: 2.3 10*3/uL — ABNORMAL LOW (ref 4.0–10.5)
nRBC: 0 % (ref 0.0–0.2)

## 2019-08-03 LAB — COMPREHENSIVE METABOLIC PANEL
ALT: 8 U/L (ref 0–44)
AST: 14 U/L — ABNORMAL LOW (ref 15–41)
Albumin: 3.5 g/dL (ref 3.5–5.0)
Alkaline Phosphatase: 93 U/L (ref 38–126)
Anion gap: 12 (ref 5–15)
BUN: 56 mg/dL — ABNORMAL HIGH (ref 6–20)
CO2: 26 mmol/L (ref 22–32)
Calcium: 8.6 mg/dL — ABNORMAL LOW (ref 8.9–10.3)
Chloride: 105 mmol/L (ref 98–111)
Creatinine, Ser: 1.75 mg/dL — ABNORMAL HIGH (ref 0.44–1.00)
GFR calc Af Amer: 38 mL/min — ABNORMAL LOW (ref >60)
GFR calc non Af Amer: 32 mL/min — ABNORMAL LOW (ref >60)
Glucose, Bld: 91 mg/dL (ref 70–99)
Potassium: 4.2 mmol/L (ref 3.5–5.1)
Sodium: 143 mmol/L (ref 135–145)
Total Bilirubin: <0.2 mg/dL — ABNORMAL LOW (ref 0.3–1.2)
Total Protein: 7 g/dL (ref 6.5–8.1)

## 2019-08-03 MED ORDER — FULVESTRANT 250 MG/5ML IM SOLN
INTRAMUSCULAR | Status: AC
  Filled 2019-08-03: qty 5

## 2019-08-03 MED ORDER — PALBOCICLIB 75 MG PO TABS: 75.0000 mg | ORAL_TABLET | Freq: Every day | ORAL | 0 refills | Status: DC

## 2019-08-03 MED ORDER — FULVESTRANT 250 MG/5ML IM SOLN
500.0000 mg | Freq: Once | INTRAMUSCULAR | Status: AC
  Administered 2019-08-03: 500 mg via INTRAMUSCULAR

## 2019-08-03 NOTE — Telephone Encounter (Signed)
I talk with patient regarding schedule  
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
## 2019-08-04 ENCOUNTER — Ambulatory Visit: Payer: 59 | Admitting: Adult Health

## 2019-08-11 ENCOUNTER — Other Ambulatory Visit: Payer: Self-pay | Admitting: Adult Health

## 2019-08-11 DIAGNOSIS — C50512 Malignant neoplasm of lower-outer quadrant of left female breast: Secondary | ICD-10-CM

## 2019-08-17 ENCOUNTER — Telehealth: Payer: Self-pay | Admitting: *Deleted

## 2019-08-17 ENCOUNTER — Other Ambulatory Visit: Payer: Self-pay

## 2019-08-17 ENCOUNTER — Inpatient Hospital Stay: Payer: No Typology Code available for payment source

## 2019-08-17 DIAGNOSIS — C50512 Malignant neoplasm of lower-outer quadrant of left female breast: Secondary | ICD-10-CM | POA: Diagnosis not present

## 2019-08-17 DIAGNOSIS — Z170 Estrogen receptor positive status [ER+]: Secondary | ICD-10-CM

## 2019-08-17 LAB — CBC WITH DIFFERENTIAL/PLATELET
Abs Immature Granulocytes: 0.02 10*3/uL (ref 0.00–0.07)
Basophils Absolute: 0 10*3/uL (ref 0.0–0.1)
Basophils Relative: 1 %
Eosinophils Absolute: 0 10*3/uL (ref 0.0–0.5)
Eosinophils Relative: 1 %
HCT: 22.1 % — ABNORMAL LOW (ref 36.0–46.0)
Hemoglobin: 7.5 g/dL — ABNORMAL LOW (ref 12.0–15.0)
Immature Granulocytes: 1 %
Lymphocytes Relative: 29 %
Lymphs Abs: 0.4 10*3/uL — ABNORMAL LOW (ref 0.7–4.0)
MCH: 29.3 pg (ref 26.0–34.0)
MCHC: 33.9 g/dL (ref 30.0–36.0)
MCV: 86.3 fL (ref 80.0–100.0)
Monocytes Absolute: 0.1 10*3/uL (ref 0.1–1.0)
Monocytes Relative: 7 %
Neutro Abs: 0.9 10*3/uL — ABNORMAL LOW (ref 1.7–7.7)
Neutrophils Relative %: 61 %
Platelets: 224 10*3/uL (ref 150–400)
RBC: 2.56 MIL/uL — ABNORMAL LOW (ref 3.87–5.11)
RDW: 13.2 % (ref 11.5–15.5)
WBC: 1.5 10*3/uL — ABNORMAL LOW (ref 4.0–10.5)
nRBC: 0 % (ref 0.0–0.2)

## 2019-08-17 LAB — COMPREHENSIVE METABOLIC PANEL
ALT: 9 U/L (ref 0–44)
AST: 14 U/L — ABNORMAL LOW (ref 15–41)
Albumin: 3.6 g/dL (ref 3.5–5.0)
Alkaline Phosphatase: 92 U/L (ref 38–126)
Anion gap: 14 (ref 5–15)
BUN: 84 mg/dL — ABNORMAL HIGH (ref 6–20)
CO2: 23 mmol/L (ref 22–32)
Calcium: 8.8 mg/dL — ABNORMAL LOW (ref 8.9–10.3)
Chloride: 103 mmol/L (ref 98–111)
Creatinine, Ser: 3.09 mg/dL (ref 0.44–1.00)
GFR calc Af Amer: 19 mL/min — ABNORMAL LOW (ref >60)
GFR calc non Af Amer: 16 mL/min — ABNORMAL LOW (ref >60)
Glucose, Bld: 86 mg/dL (ref 70–99)
Potassium: 3.8 mmol/L (ref 3.5–5.1)
Sodium: 140 mmol/L (ref 135–145)
Total Bilirubin: <0.2 mg/dL — ABNORMAL LOW (ref 0.3–1.2)
Total Protein: 7.5 g/dL (ref 6.5–8.1)

## 2019-08-17 NOTE — Telephone Encounter (Signed)
This RN spoke with pt per noted critical creatinine level.  This RN reviewed with pt recommendation by provider for her to stop the Ibrance ( noted elevation with prior dosing ) and to come in for IVF.  Daiya states she is " really not able to come back in and cannot I not do something at home "  This RN discussed above- including goal of intake to be in addition to current fluid intake - at least an additional 1 liter of non caffeine, non sugar based fluid.   She may use some Gatorade but ideally water is best.  Malachy Mood verbalized understanding and would like to be aggressive with hydration in the home.  Presently pt will stop the [place].  This RN will follow up with pt post review with provider.

## 2019-08-17 NOTE — Telephone Encounter (Signed)
CRITICAL VALUE STICKER  CRITICAL VALUE: Creat = 3.09.  RECEIVER (on-site recipient of call): Obbie Lewallen [place]-Spruiell RN, Triage [facility].   DATE & TIME NOTIFIED: 08/17/2019 at 1302.   MESSENGER (representative from lab): Corbin Ade [facility].  MD NOTIFIED: Collaborative nurse.  TIME OF NOTIFICATION: 08/17/2019 at 1315.  RESPONSE: None.

## 2019-08-29 ENCOUNTER — Ambulatory Visit: Payer: 59

## 2019-08-29 ENCOUNTER — Other Ambulatory Visit: Payer: 59

## 2019-08-29 ENCOUNTER — Inpatient Hospital Stay: Payer: No Typology Code available for payment source | Attending: Oncology

## 2019-08-29 ENCOUNTER — Inpatient Hospital Stay: Payer: No Typology Code available for payment source

## 2019-08-29 ENCOUNTER — Inpatient Hospital Stay (HOSPITAL_BASED_OUTPATIENT_CLINIC_OR_DEPARTMENT_OTHER): Payer: No Typology Code available for payment source | Admitting: Adult Health

## 2019-08-29 ENCOUNTER — Encounter: Payer: Self-pay | Admitting: Adult Health

## 2019-08-29 ENCOUNTER — Other Ambulatory Visit: Payer: Self-pay

## 2019-08-29 VITALS — BP 121/90 | HR 83 | Temp 98.9°F | Resp 18 | Ht 59.0 in | Wt 158.7 lb

## 2019-08-29 DIAGNOSIS — C50512 Malignant neoplasm of lower-outer quadrant of left female breast: Secondary | ICD-10-CM | POA: Diagnosis not present

## 2019-08-29 DIAGNOSIS — M3214 Glomerular disease in systemic lupus erythematosus: Secondary | ICD-10-CM | POA: Diagnosis not present

## 2019-08-29 DIAGNOSIS — Z79811 Long term (current) use of aromatase inhibitors: Secondary | ICD-10-CM | POA: Insufficient documentation

## 2019-08-29 DIAGNOSIS — I130 Hypertensive heart and chronic kidney disease with heart failure and stage 1 through stage 4 chronic kidney disease, or unspecified chronic kidney disease: Secondary | ICD-10-CM | POA: Diagnosis not present

## 2019-08-29 DIAGNOSIS — Z923 Personal history of irradiation: Secondary | ICD-10-CM | POA: Insufficient documentation

## 2019-08-29 DIAGNOSIS — Z170 Estrogen receptor positive status [ER+]: Secondary | ICD-10-CM

## 2019-08-29 DIAGNOSIS — N189 Chronic kidney disease, unspecified: Secondary | ICD-10-CM | POA: Insufficient documentation

## 2019-08-29 DIAGNOSIS — C7951 Secondary malignant neoplasm of bone: Secondary | ICD-10-CM | POA: Diagnosis not present

## 2019-08-29 DIAGNOSIS — D649 Anemia, unspecified: Secondary | ICD-10-CM | POA: Diagnosis not present

## 2019-08-29 DIAGNOSIS — I509 Heart failure, unspecified: Secondary | ICD-10-CM | POA: Insufficient documentation

## 2019-08-29 DIAGNOSIS — Z5111 Encounter for antineoplastic chemotherapy: Secondary | ICD-10-CM | POA: Diagnosis not present

## 2019-08-29 DIAGNOSIS — Z9221 Personal history of antineoplastic chemotherapy: Secondary | ICD-10-CM | POA: Insufficient documentation

## 2019-08-29 DIAGNOSIS — Z79899 Other long term (current) drug therapy: Secondary | ICD-10-CM | POA: Insufficient documentation

## 2019-08-29 DIAGNOSIS — Z853 Personal history of malignant neoplasm of breast: Secondary | ICD-10-CM | POA: Diagnosis not present

## 2019-08-29 LAB — CBC WITH DIFFERENTIAL/PLATELET
Abs Immature Granulocytes: 0 10*3/uL (ref 0.00–0.07)
Basophils Absolute: 0 10*3/uL (ref 0.0–0.1)
Basophils Relative: 1 %
Eosinophils Absolute: 0 10*3/uL (ref 0.0–0.5)
Eosinophils Relative: 1 %
HCT: 21.9 % — ABNORMAL LOW (ref 36.0–46.0)
Hemoglobin: 7.2 g/dL — ABNORMAL LOW (ref 12.0–15.0)
Immature Granulocytes: 0 %
Lymphocytes Relative: 21 %
Lymphs Abs: 0.5 10*3/uL — ABNORMAL LOW (ref 0.7–4.0)
MCH: 29.4 pg (ref 26.0–34.0)
MCHC: 32.9 g/dL (ref 30.0–36.0)
MCV: 89.4 fL (ref 80.0–100.0)
Monocytes Absolute: 0.5 10*3/uL (ref 0.1–1.0)
Monocytes Relative: 22 %
Neutro Abs: 1.2 10*3/uL — ABNORMAL LOW (ref 1.7–7.7)
Neutrophils Relative %: 55 %
Platelets: 124 10*3/uL — ABNORMAL LOW (ref 150–400)
RBC: 2.45 MIL/uL — ABNORMAL LOW (ref 3.87–5.11)
RDW: 14.1 % (ref 11.5–15.5)
WBC: 2.2 10*3/uL — ABNORMAL LOW (ref 4.0–10.5)
nRBC: 0 % (ref 0.0–0.2)

## 2019-08-29 LAB — COMPREHENSIVE METABOLIC PANEL
ALT: 11 U/L (ref 0–44)
AST: 15 U/L (ref 15–41)
Albumin: 3.7 g/dL (ref 3.5–5.0)
Alkaline Phosphatase: 74 U/L (ref 38–126)
Anion gap: 11 (ref 5–15)
BUN: 64 mg/dL — ABNORMAL HIGH (ref 6–20)
CO2: 24 mmol/L (ref 22–32)
Calcium: 8.2 mg/dL — ABNORMAL LOW (ref 8.9–10.3)
Chloride: 105 mmol/L (ref 98–111)
Creatinine, Ser: 1.9 mg/dL — ABNORMAL HIGH (ref 0.44–1.00)
GFR calc Af Amer: 34 mL/min — ABNORMAL LOW (ref >60)
GFR calc non Af Amer: 29 mL/min — ABNORMAL LOW (ref >60)
Glucose, Bld: 93 mg/dL (ref 70–99)
Potassium: 3.7 mmol/L (ref 3.5–5.1)
Sodium: 140 mmol/L (ref 135–145)
Total Bilirubin: 0.3 mg/dL (ref 0.3–1.2)
Total Protein: 7.1 g/dL (ref 6.5–8.1)

## 2019-08-29 MED ORDER — PALBOCICLIB 75 MG PO TABS: 75.0000 mg | ORAL_TABLET | ORAL | 0 refills | Status: DC

## 2019-08-29 MED ORDER — FULVESTRANT 250 MG/5ML IM SOLN
INTRAMUSCULAR | Status: AC
  Filled 2019-08-29: qty 5

## 2019-08-29 MED ORDER — FULVESTRANT 250 MG/5ML IM SOLN
500.0000 mg | Freq: Once | INTRAMUSCULAR | Status: AC
  Administered 2019-08-29: 500 mg via INTRAMUSCULAR
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
## 2019-08-29 NOTE — Patient Instructions (Signed)
Fulvestrant injection What is this medicine? FULVESTRANT (ful VES trant) blocks the effects of estrogen. It is used to treat breast cancer. This medicine may be used for other purposes; ask your health care provider or pharmacist if you have questions. COMMON BRAND NAME(S): FASLODEX What should I tell my health care provider before I take this medicine? They need to know if you have any of these conditions:  bleeding disorders  liver disease  low blood counts, like low white cell, platelet, or red cell counts  an unusual or allergic reaction to fulvestrant, other medicines, foods, dyes, or preservatives  pregnant or trying to get pregnant  breast-feeding How should I use this medicine? This medicine is for injection into a muscle. It is usually given by a health care professional in a hospital or clinic setting. Talk to your pediatrician regarding the use of this medicine in children. Special care may be needed. Overdosage: If you think you have taken too much of this medicine contact a poison control center or emergency room at once. NOTE: This medicine is only for you. Do not share this medicine with others. What if I miss a dose? It is important not to miss your dose. Call your doctor or health care professional if you are unable to keep an appointment. What may interact with this medicine?  medicines that treat or prevent blood clots like warfarin, enoxaparin, dalteparin, apixaban, dabigatran, and rivaroxaban This list may not describe all possible interactions. Give your health care provider a list of all the medicines, herbs, non-prescription drugs, or dietary supplements you use. Also tell them if you smoke, drink alcohol, or use illegal drugs. Some items may interact with your medicine. What should I watch for while using this medicine? Your condition will be monitored carefully while you are receiving this medicine. You will need important blood work done while you are taking  this medicine. Do not become pregnant while taking this medicine or for at least 1 year after stopping it. Women of child-bearing potential will need to have a negative pregnancy test before starting this medicine. Women should inform their doctor if they wish to become pregnant or think they might be pregnant. There is a potential for serious side effects to an unborn child. Men should inform their doctors if they wish to father a child. This medicine may lower sperm counts. Talk to your health care professional or pharmacist for more information. Do not breast-feed an infant while taking this medicine or for 1 year after the last dose. What side effects may I notice from receiving this medicine? Side effects that you should report to your doctor or health care professional as soon as possible:  allergic reactions like skin rash, itching or hives, swelling of the face, lips, or tongue  feeling faint or lightheaded, falls  pain, tingling, numbness, or weakness in the legs  signs and symptoms of infection like fever or chills; cough; flu-like symptoms; sore throat  vaginal bleeding Side effects that usually do not require medical attention (report to your doctor or health care professional if they continue or are bothersome):  aches, pains  constipation  diarrhea  headache  hot flashes  nausea, vomiting  pain at site where injected  stomach pain This list may not describe all possible side effects. Call your doctor for medical advice about side effects. You may report side effects to FDA at 1-800-FDA-1088. Where should I keep my medicine? This drug is given in a hospital or clinic and will   not be stored at home. NOTE: This sheet is a summary. It may not cover all possible information. If you have questions about this medicine, talk to your doctor, pharmacist, or health care provider.  2020 Elsevier/Gold Standard (2018-01-13 11:34:41)  

## 2019-09-11 ENCOUNTER — Other Ambulatory Visit: Payer: Self-pay | Admitting: Adult Health

## 2019-09-11 ENCOUNTER — Encounter: Payer: Self-pay | Admitting: Family Medicine

## 2019-09-11 DIAGNOSIS — C50512 Malignant neoplasm of lower-outer quadrant of left female breast: Secondary | ICD-10-CM

## 2019-09-22 ENCOUNTER — Ambulatory Visit (INDEPENDENT_AMBULATORY_CARE_PROVIDER_SITE_OTHER): Payer: 59 | Admitting: Family Medicine

## 2019-09-22 ENCOUNTER — Encounter: Payer: Self-pay | Admitting: Family Medicine

## 2019-09-22 ENCOUNTER — Other Ambulatory Visit: Payer: Self-pay

## 2019-09-22 DIAGNOSIS — I5022 Chronic systolic (congestive) heart failure: Secondary | ICD-10-CM | POA: Diagnosis not present

## 2019-09-22 DIAGNOSIS — M3214 Glomerular disease in systemic lupus erythematosus: Secondary | ICD-10-CM

## 2019-09-22 DIAGNOSIS — Z170 Estrogen receptor positive status [ER+]: Secondary | ICD-10-CM

## 2019-09-22 DIAGNOSIS — Z23 Encounter for immunization: Secondary | ICD-10-CM | POA: Diagnosis not present

## 2019-09-22 DIAGNOSIS — I10 Essential (primary) hypertension: Secondary | ICD-10-CM

## 2019-09-22 DIAGNOSIS — C50512 Malignant neoplasm of lower-outer quadrant of left female breast: Secondary | ICD-10-CM

## 2019-09-22 DIAGNOSIS — M25572 Pain in left ankle and joints of left foot: Secondary | ICD-10-CM

## 2019-09-22 DIAGNOSIS — G8929 Other chronic pain: Secondary | ICD-10-CM

## 2019-09-22 NOTE — Progress Notes (Signed)
This visit was conducted in person.  BP 122/74   Pulse 74   Temp 97.8 F (36.6 C) (Temporal)   Wt 158 lb (71.7 kg)   LMP 03/04/2009 (Approximate)   SpO2 100%   BMI 31.91 kg/m    CC: 3 mo f/u visit Subjective:    Patient ID: Melanie Holloway, female    DOB: 02-Oct-1966, 53 y.o.   MRN: [id number]  HPI: Melanie Holloway is a 53 y.o. female presenting on 09/22/2019 for 3 Month Follow Up   Known h/o SLE, nonischemic dilated CM, chronic sCHF, and breast cancer metastatic to bone. Alendronate started 04/2019.   Undergoing breast cancer chemotherapy treatment with fulvestrant and palbocicilib (Magrinat). On palbociclib QOD as daily dosing cause acute renal insufficiency.   SLE - continues cellcept and hydroxychloroquine daily, has yearly eye exam.   Since last seen here she did see cards and renal - stable period.   She did receive steroid injection into L ankle for post-traumatic OA with benefit.   Has decided not to have presumed skin tag to anterior R thigh removed.      Relevant past medical, surgical, family and social history reviewed and updated as indicated. Interim medical history since our last visit reviewed. Allergies and medications reviewed and updated. Outpatient Medications Prior to Visit  Medication Sig Dispense Refill  . acetaminophen (TYLENOL) 325 MG tablet Take 2 tablets (650 mg total) by mouth every 4 (four) hours as needed for headache or mild pain. 30 tablet 0  . alendronate (FOSAMAX) 70 MG tablet Take 1 tablet (70 mg total) by mouth once a week. Take with a full glass of water on an empty stomach. 5 tablet 6  . Cholecalciferol (VITAMIN D) 2000 UNITS CAPS Take 1 capsule (2,000 Units total) by mouth daily. 30 capsule   . clobetasol cream (TEMOVATE) 0.10 % Apply 1 application topically 2 (two) times daily.    Marland Kitchen ENTRESTO 24-26 MG TAKE 1 TABLET BY MOUTH TWICE A DAY 180 tablet 3  . hydroxychloroquine (PLAQUENIL) 200 MG tablet Take 1 tablet (200 mg total) by mouth  daily.    Leslee Home 75 MG tablet TAKE 1 TABLET BY MOUTH EVERY OTHER DAY FOR 21 DAYS OF A 28 DAY CYCLE 14 tablet 1  . letrozole (FEMARA) 2.5 MG tablet TAKE 1 TABLET BY MOUTH EVERY DAY 90 tablet 4  . metoprolol succinate (TOPROL-XL) 50 MG 24 hr tablet Take 1 tablet (50 mg total) by mouth daily. Take with or immediately following a meal. 30 tablet 0  . mycophenolate (CELLCEPT) 250 MG capsule Take 2 capsules (500 mg total) by mouth 2 (two) times daily. 60 capsule 0  . potassium chloride (K-DUR) 10 MEQ tablet Take 10 mEq by mouth daily.    Marland Kitchen torsemide (DEMADEX) 20 MG tablet Take 2 tablets (40 mg total) by mouth 2 (two) times daily. 60 tablet 0   No facility-administered medications prior to visit.      Per HPI unless specifically indicated in ROS section below Review of Systems Objective:    BP 122/74   Pulse 74   Temp 97.8 F (36.6 C) (Temporal)   Wt 158 lb (71.7 kg)   LMP 03/04/2009 (Approximate)   SpO2 100%   BMI 31.91 kg/m   Wt Readings from Last 3 Encounters:  09/22/19 158 lb (71.7 kg)  08/29/19 158 lb 11.2 oz (72 kg)  08/03/19 158 lb 12.8 oz (72 kg)    Physical Exam Vitals signs and nursing note reviewed.  Constitutional:      Appearance: Normal appearance. She is not ill-appearing.  Eyes:     Extraocular Movements: Extraocular movements intact.     Pupils: Pupils are equal, round, and reactive to light.     Comments: Early cataracts noted  Cardiovascular:     Rate and Rhythm: Normal rate and regular rhythm.     Pulses: Normal pulses.     Heart sounds: Murmur (faint 2/6 systolic) present.  Pulmonary:     Effort: Pulmonary effort is normal. No respiratory distress.     Breath sounds: Normal breath sounds. No wheezing, rhonchi or rales.  Musculoskeletal:     Right lower leg: No edema.     Left lower leg: No edema.  Neurological:     Mental Status: She is alert.  Psychiatric:        Mood and Affect: Mood normal.        Behavior: Behavior normal.       Results for  orders placed or performed in visit on 08/29/19  Comprehensive metabolic panel  Result Value Ref Range   Sodium 140 135 - 145 mmol/L   Potassium 3.7 3.5 - 5.1 mmol/L   Chloride 105 98 - 111 mmol/L   CO2 24 22 - 32 mmol/L   Glucose, Bld 93 70 - 99 mg/dL   BUN 64 (H) 6 - 20 mg/dL   Creatinine, Ser 1.90 (H) 0.44 - 1.00 mg/dL   Calcium 8.2 (L) 8.9 - 10.3 mg/dL   Total Protein 7.1 6.5 - 8.1 g/dL   Albumin 3.7 3.5 - 5.0 g/dL   AST 15 15 - 41 U/L   ALT 11 0 - 44 U/L   Alkaline Phosphatase 74 38 - 126 U/L   Total Bilirubin 0.3 0.3 - 1.2 mg/dL   GFR calc non Af Amer 29 (L) >60 mL/min   GFR calc Af Amer 34 (L) >60 mL/min   Anion gap 11 5 - 15  CBC with Differential/Platelet  Result Value Ref Range   WBC 2.2 (L) 4.0 - 10.5 K/uL   RBC 2.45 (L) 3.87 - 5.11 MIL/uL   Hemoglobin 7.2 (L) 12.0 - 15.0 g/dL   HCT 21.9 (L) 36.0 - 46.0 %   MCV 89.4 80.0 - 100.0 fL   MCH 29.4 26.0 - 34.0 pg   MCHC 32.9 30.0 - 36.0 g/dL   RDW 14.1 11.5 - 15.5 %   Platelets 124 (L) 150 - 400 K/uL   nRBC 0.0 0.0 - 0.2 %   Neutrophils Relative % 55 %   Neutro Abs 1.2 (L) 1.7 - 7.7 K/uL   Lymphocytes Relative 21 %   Lymphs Abs 0.5 (L) 0.7 - 4.0 K/uL   Monocytes Relative 22 %   Monocytes Absolute 0.5 0.1 - 1.0 K/uL   Eosinophils Relative 1 %   Eosinophils Absolute 0.0 0.0 - 0.5 K/uL   Basophils Relative 1 %   Basophils Absolute 0.0 0.0 - 0.1 K/uL   Immature Granulocytes 0 %   Abs Immature Granulocytes 0.00 0.00 - 0.07 K/uL   Assessment & Plan:  This visit occurred during the SARS-CoV-2 public health emergency.  Safety protocols were in place, including screening questions prior to the visit, additional usage of staff PPE, and extensive cleaning of exam room while observing appropriate contact time as indicated for disinfecting solutions.   Problem List Items Addressed This Visit    Systemic lupus erythematosus ([name])    Sees rheum/nephrology. Continues hydroxychloroquine and cellcept, gets yearly eye exam.  Malignant neoplasm of lower-outer quadrant of left breast of female, estrogen receptor positive ([name])    Recurrence with meds, now undergoing treatment with monthly fulvestrant and palbociclib QOD. Appreciate onc care.       Lupus nephritis, ISN/RPS class V ([place])    Appreciate nephrology care [facility]).  Continue to encourage good hydration and avoiding nephrotoxic agents.       Essential hypertension    Chronic, stable. Continue current regimen.       Chronic pain of left ankle    Saw ortho s/p steroid injection into ankle with benefit.       Chronic HFrEF (heart failure with reduced ejection fraction) ([place])    Appreciate CHF clinic assistance - now on entresto with toprol XL       Other Visit Diagnoses    Need for immunization against influenza       Relevant Orders   Flu Vaccine QUAD 36+ mos IM (Completed)       No orders of the defined types were placed in this encounter.  Orders Placed This Encounter  Procedures  . Flu Vaccine QUAD 36+ mos IM   Patient Instructions  Flu shot today.  Continue current medicines.  Good to see you today.  Continue good water intake to keep kidneys well hydrated.  Return as needed or in September for next physical.    Follow up plan: Return in about 9 months (around 06/22/2020) for annual exam, prior fasting for blood work.  Ria Bush, MD

## 2019-09-22 NOTE — Assessment & Plan Note (Signed)
Recurrence with meds, now undergoing treatment with monthly fulvestrant and palbociclib QOD. Appreciate onc care.

## 2019-09-22 NOTE — Assessment & Plan Note (Addendum)
Sees rheum/nephrology. Continues hydroxychloroquine and cellcept, gets yearly eye exam.

## 2019-09-22 NOTE — Patient Instructions (Addendum)
Flu shot today.  Continue current medicines.  Good to see you today.  Continue good water intake to keep kidneys well hydrated.  Return as needed or in September for next physical.

## 2019-09-22 NOTE — Assessment & Plan Note (Signed)
Chronic, stable. Continue current regimen. 

## 2019-09-22 NOTE — Assessment & Plan Note (Signed)
Appreciate CHF clinic assistance - now on entresto with toprol XL

## 2019-09-22 NOTE — Assessment & Plan Note (Addendum)
Appreciate nephrology care [facility]).  Continue to encourage good hydration and avoiding nephrotoxic agents.

## 2019-09-22 NOTE — Assessment & Plan Note (Signed)
Saw ortho s/p steroid injection into ankle with benefit.

## 2019-09-25 ENCOUNTER — Other Ambulatory Visit: Payer: Self-pay | Admitting: Oncology

## 2019-09-26 ENCOUNTER — Other Ambulatory Visit: Payer: Self-pay

## 2019-09-26 ENCOUNTER — Inpatient Hospital Stay (HOSPITAL_BASED_OUTPATIENT_CLINIC_OR_DEPARTMENT_OTHER): Payer: Managed Care, Other (non HMO) | Admitting: Adult Health

## 2019-09-26 ENCOUNTER — Inpatient Hospital Stay: Payer: Managed Care, Other (non HMO)

## 2019-09-26 ENCOUNTER — Inpatient Hospital Stay: Payer: Managed Care, Other (non HMO) | Attending: Oncology

## 2019-09-26 ENCOUNTER — Other Ambulatory Visit: Payer: 59

## 2019-09-26 ENCOUNTER — Encounter: Payer: Self-pay | Admitting: Adult Health

## 2019-09-26 VITALS — BP 118/66 | HR 73 | Temp 98.1°F | Resp 18 | Ht 59.0 in | Wt 158.9 lb

## 2019-09-26 DIAGNOSIS — D696 Thrombocytopenia, unspecified: Secondary | ICD-10-CM | POA: Diagnosis not present

## 2019-09-26 DIAGNOSIS — N189 Chronic kidney disease, unspecified: Secondary | ICD-10-CM | POA: Diagnosis not present

## 2019-09-26 DIAGNOSIS — C50512 Malignant neoplasm of lower-outer quadrant of left female breast: Secondary | ICD-10-CM | POA: Insufficient documentation

## 2019-09-26 DIAGNOSIS — M3214 Glomerular disease in systemic lupus erythematosus: Secondary | ICD-10-CM | POA: Diagnosis not present

## 2019-09-26 DIAGNOSIS — D649 Anemia, unspecified: Secondary | ICD-10-CM | POA: Insufficient documentation

## 2019-09-26 DIAGNOSIS — Z923 Personal history of irradiation: Secondary | ICD-10-CM | POA: Diagnosis not present

## 2019-09-26 DIAGNOSIS — Z170 Estrogen receptor positive status [ER+]: Secondary | ICD-10-CM

## 2019-09-26 DIAGNOSIS — C7951 Secondary malignant neoplasm of bone: Secondary | ICD-10-CM | POA: Insufficient documentation

## 2019-09-26 DIAGNOSIS — Z79811 Long term (current) use of aromatase inhibitors: Secondary | ICD-10-CM | POA: Diagnosis not present

## 2019-09-26 DIAGNOSIS — D701 Agranulocytosis secondary to cancer chemotherapy: Secondary | ICD-10-CM | POA: Diagnosis not present

## 2019-09-26 DIAGNOSIS — Z79899 Other long term (current) drug therapy: Secondary | ICD-10-CM | POA: Diagnosis not present

## 2019-09-26 DIAGNOSIS — Z833 Family history of diabetes mellitus: Secondary | ICD-10-CM | POA: Diagnosis not present

## 2019-09-26 DIAGNOSIS — I130 Hypertensive heart and chronic kidney disease with heart failure and stage 1 through stage 4 chronic kidney disease, or unspecified chronic kidney disease: Secondary | ICD-10-CM | POA: Diagnosis not present

## 2019-09-26 DIAGNOSIS — Z9221 Personal history of antineoplastic chemotherapy: Secondary | ICD-10-CM | POA: Diagnosis not present

## 2019-09-26 DIAGNOSIS — Z5111 Encounter for antineoplastic chemotherapy: Secondary | ICD-10-CM | POA: Insufficient documentation

## 2019-09-26 LAB — COMPREHENSIVE METABOLIC PANEL
ALT: 352 U/L (ref 0–44)
AST: 165 U/L — ABNORMAL HIGH (ref 15–41)
Albumin: 3.5 g/dL (ref 3.5–5.0)
Alkaline Phosphatase: 228 U/L — ABNORMAL HIGH (ref 38–126)
Anion gap: 12 (ref 5–15)
BUN: 58 mg/dL — ABNORMAL HIGH (ref 6–20)
CO2: 24 mmol/L (ref 22–32)
Calcium: 8.4 mg/dL — ABNORMAL LOW (ref 8.9–10.3)
Chloride: 104 mmol/L (ref 98–111)
Creatinine, Ser: 2.26 mg/dL — ABNORMAL HIGH (ref 0.44–1.00)
GFR calc Af Amer: 28 mL/min — ABNORMAL LOW (ref >60)
GFR calc non Af Amer: 24 mL/min — ABNORMAL LOW (ref >60)
Glucose, Bld: 95 mg/dL (ref 70–99)
Potassium: 3.6 mmol/L (ref 3.5–5.1)
Sodium: 140 mmol/L (ref 135–145)
Total Bilirubin: 0.4 mg/dL (ref 0.3–1.2)
Total Protein: 6.9 g/dL (ref 6.5–8.1)

## 2019-09-26 LAB — CBC WITH DIFFERENTIAL/PLATELET
Abs Immature Granulocytes: 0 10*3/uL (ref 0.00–0.07)
Basophils Absolute: 0 10*3/uL (ref 0.0–0.1)
Basophils Relative: 1 %
Eosinophils Absolute: 0 10*3/uL (ref 0.0–0.5)
Eosinophils Relative: 3 %
HCT: 21.3 % — ABNORMAL LOW (ref 36.0–46.0)
Hemoglobin: 7.1 g/dL — ABNORMAL LOW (ref 12.0–15.0)
Immature Granulocytes: 0 %
Lymphocytes Relative: 23 %
Lymphs Abs: 0.3 10*3/uL — ABNORMAL LOW (ref 0.7–4.0)
MCH: 30.2 pg (ref 26.0–34.0)
MCHC: 33.3 g/dL (ref 30.0–36.0)
MCV: 90.6 fL (ref 80.0–100.0)
Monocytes Absolute: 0.1 10*3/uL (ref 0.1–1.0)
Monocytes Relative: 11 %
Neutro Abs: 0.8 10*3/uL — ABNORMAL LOW (ref 1.7–7.7)
Neutrophils Relative %: 62 %
Platelets: 109 10*3/uL — ABNORMAL LOW (ref 150–400)
RBC: 2.35 MIL/uL — ABNORMAL LOW (ref 3.87–5.11)
RDW: 14.3 % (ref 11.5–15.5)
WBC: 1.2 10*3/uL — ABNORMAL LOW (ref 4.0–10.5)
nRBC: 0 % (ref 0.0–0.2)

## 2019-09-26 MED ORDER — FULVESTRANT 250 MG/5ML IM SOLN
INTRAMUSCULAR | Status: AC
  Filled 2019-09-26: qty 5

## 2019-09-26 MED ORDER — FULVESTRANT 250 MG/5ML IM SOLN
500.0000 mg | Freq: Once | INTRAMUSCULAR | Status: AC
  Administered 2019-09-26: 500 mg via INTRAMUSCULAR

## 2019-09-26 NOTE — Patient Instructions (Signed)
Fulvestrant injection What is this medicine? FULVESTRANT (ful VES trant) blocks the effects of estrogen. It is used to treat breast cancer. This medicine may be used for other purposes; ask your health care provider or pharmacist if you have questions. COMMON BRAND NAME(S): FASLODEX What should I tell my health care provider before I take this medicine? They need to know if you have any of these conditions:  bleeding disorders  liver disease  low blood counts, like low white cell, platelet, or red cell counts  an unusual or allergic reaction to fulvestrant, other medicines, foods, dyes, or preservatives  pregnant or trying to get pregnant  breast-feeding How should I use this medicine? This medicine is for injection into a muscle. It is usually given by a health care professional in a hospital or clinic setting. Talk to your pediatrician regarding the use of this medicine in children. Special care may be needed. Overdosage: If you think you have taken too much of this medicine contact a poison control center or emergency room at once. NOTE: This medicine is only for you. Do not share this medicine with others. What if I miss a dose? It is important not to miss your dose. Call your doctor or health care professional if you are unable to keep an appointment. What may interact with this medicine?  medicines that treat or prevent blood clots like warfarin, enoxaparin, dalteparin, apixaban, dabigatran, and rivaroxaban This list may not describe all possible interactions. Give your health care provider a list of all the medicines, herbs, non-prescription drugs, or dietary supplements you use. Also tell them if you smoke, drink alcohol, or use illegal drugs. Some items may interact with your medicine. What should I watch for while using this medicine? Your condition will be monitored carefully while you are receiving this medicine. You will need important blood work done while you are taking  this medicine. Do not become pregnant while taking this medicine or for at least 1 year after stopping it. Women of child-bearing potential will need to have a negative pregnancy test before starting this medicine. Women should inform their doctor if they wish to become pregnant or think they might be pregnant. There is a potential for serious side effects to an unborn child. Men should inform their doctors if they wish to father a child. This medicine may lower sperm counts. Talk to your health care professional or pharmacist for more information. Do not breast-feed an infant while taking this medicine or for 1 year after the last dose. What side effects may I notice from receiving this medicine? Side effects that you should report to your doctor or health care professional as soon as possible:  allergic reactions like skin rash, itching or hives, swelling of the face, lips, or tongue  feeling faint or lightheaded, falls  pain, tingling, numbness, or weakness in the legs  signs and symptoms of infection like fever or chills; cough; flu-like symptoms; sore throat  vaginal bleeding Side effects that usually do not require medical attention (report to your doctor or health care professional if they continue or are bothersome):  aches, pains  constipation  diarrhea  headache  hot flashes  nausea, vomiting  pain at site where injected  stomach pain This list may not describe all possible side effects. Call your doctor for medical advice about side effects. You may report side effects to FDA at 1-800-FDA-1088. Where should I keep my medicine? This drug is given in a hospital or clinic and will   not be stored at home. NOTE: This sheet is a summary. It may not cover all possible information. If you have questions about this medicine, talk to your doctor, pharmacist, or health care provider.  2020 Elsevier/Gold Standard (2018-01-13 11:34:41)  

## 2019-09-26 NOTE — Progress Notes (Signed)
Patient is okay to receive fulvestrant today per Wilber Bihari NP.
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
## 2019-09-27 ENCOUNTER — Telehealth: Payer: Self-pay | Admitting: Oncology

## 2019-09-27 NOTE — Telephone Encounter (Signed)
I talk with patient regarding schedule  

## 2019-10-02 IMAGING — CR DG CHEST 2V
2 series · 2 of 2 positions shown · non-contrast
Comparison: PA and lateral chest 12/07/2017.  CT chest 11/29/2016.

CLINICAL DATA: Shortness of breath beginning this morning.

EXAM:
CHEST - 2 VIEW

[chest lat]
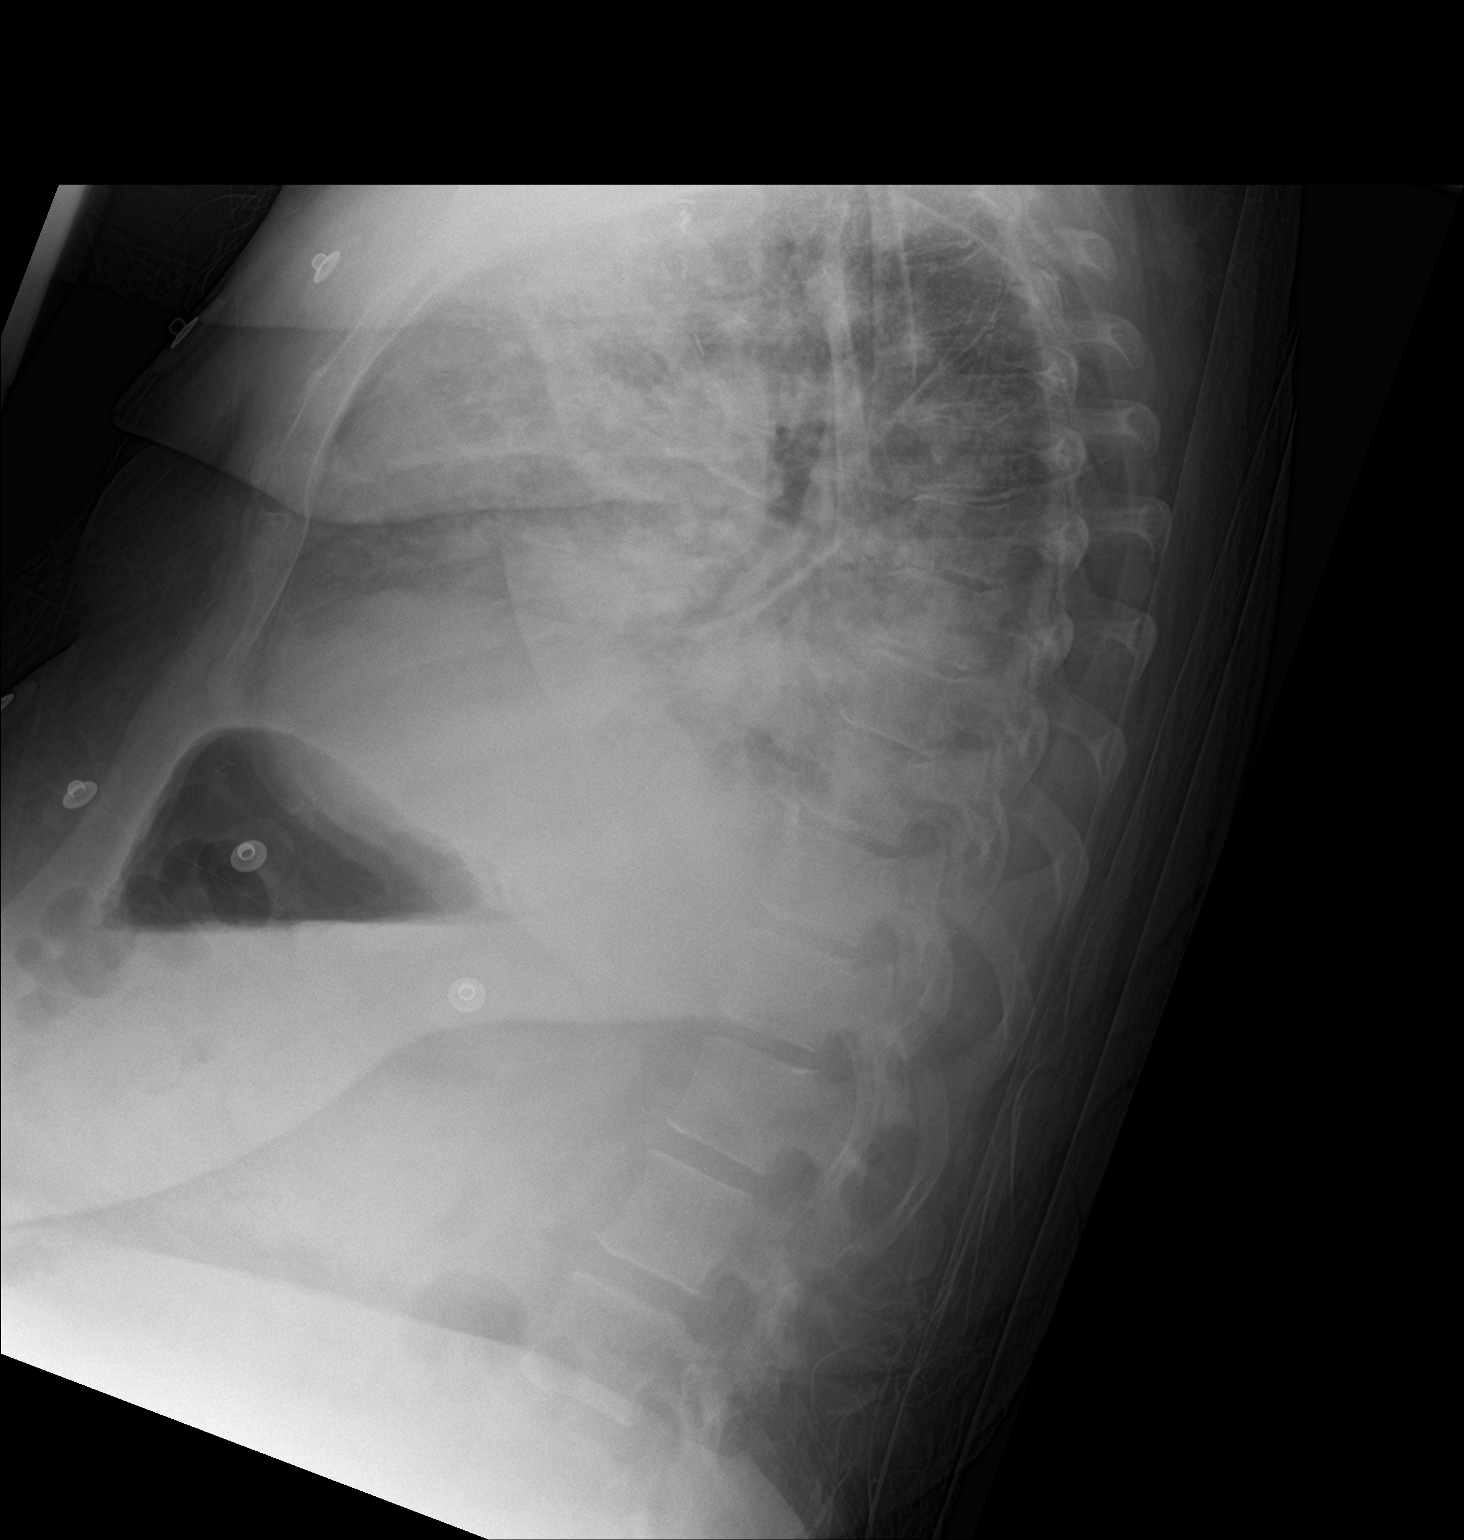

[chest ap]
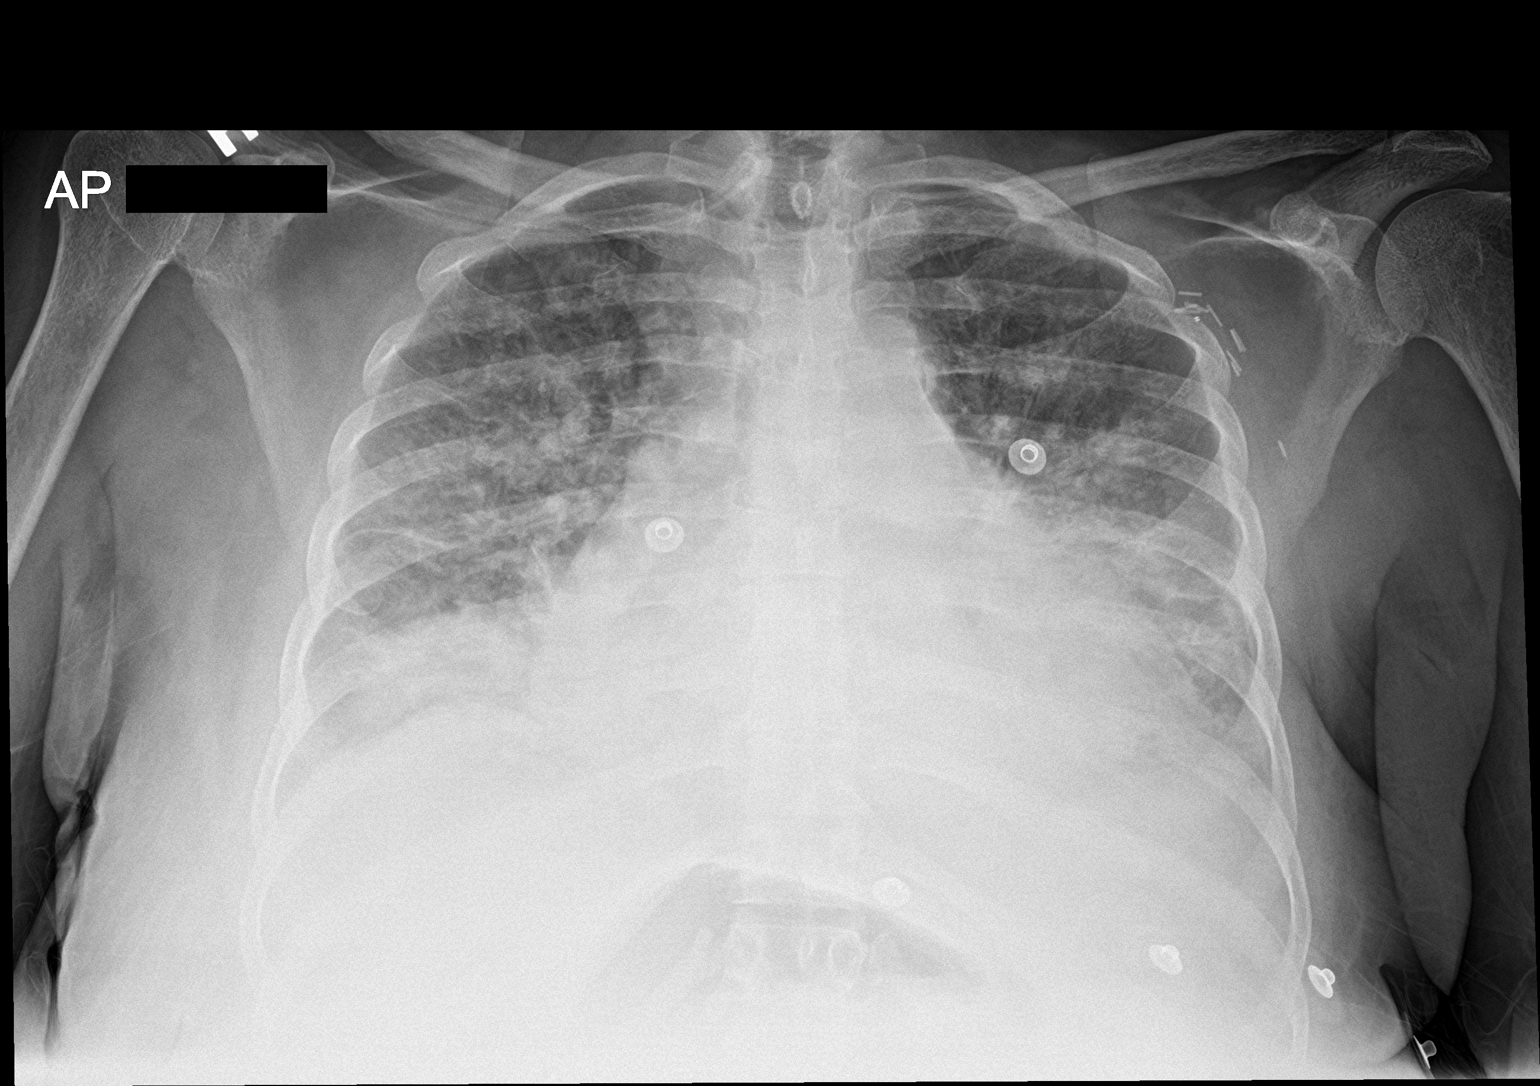

[2 of 2 positions shown; findings below may reference images not displayed]

FINDINGS: Enlargement of the cardiopericardial silhouette consistent with
cardiomegaly and pericardial effusion as seen on recent chest CT
persists. Bilateral airspace disease identified and is new since the
most recent exam. There are small to moderate bilateral pleural
effusions. The patient is status post left mastectomy and axillary
dissection.
IMPRESSION: Marked worsening in extensive bilateral airspace disease with
associated small to moderate bilateral pleural effusions is most
worrisome for pulmonary edema. Coexistent pneumonia is possible.

Enlarged cardiopericardial silhouette consistent with cardiomegaly
and pericardial effusion as seen on recent chest CT.

## 2019-10-04 ENCOUNTER — Other Ambulatory Visit: Payer: Self-pay | Admitting: Adult Health

## 2019-10-04 DIAGNOSIS — C50512 Malignant neoplasm of lower-outer quadrant of left female breast: Secondary | ICD-10-CM

## 2019-10-04 DIAGNOSIS — C7951 Secondary malignant neoplasm of bone: Secondary | ICD-10-CM

## 2019-10-05 ENCOUNTER — Telehealth: Payer: Self-pay | Admitting: Adult Health

## 2019-10-05 DIAGNOSIS — C50512 Malignant neoplasm of lower-outer quadrant of left female breast: Secondary | ICD-10-CM

## 2019-10-05 DIAGNOSIS — C7951 Secondary malignant neoplasm of bone: Secondary | ICD-10-CM

## 2019-10-08 IMAGING — DX DG CHEST 1V PORT
1 series · 1 of 1 positions shown · non-contrast
Comparison: 02/13/2018 chest radiograph.

CLINICAL DATA: CHF

EXAM:
PORTABLE CHEST 1 VIEW

[chest ap]
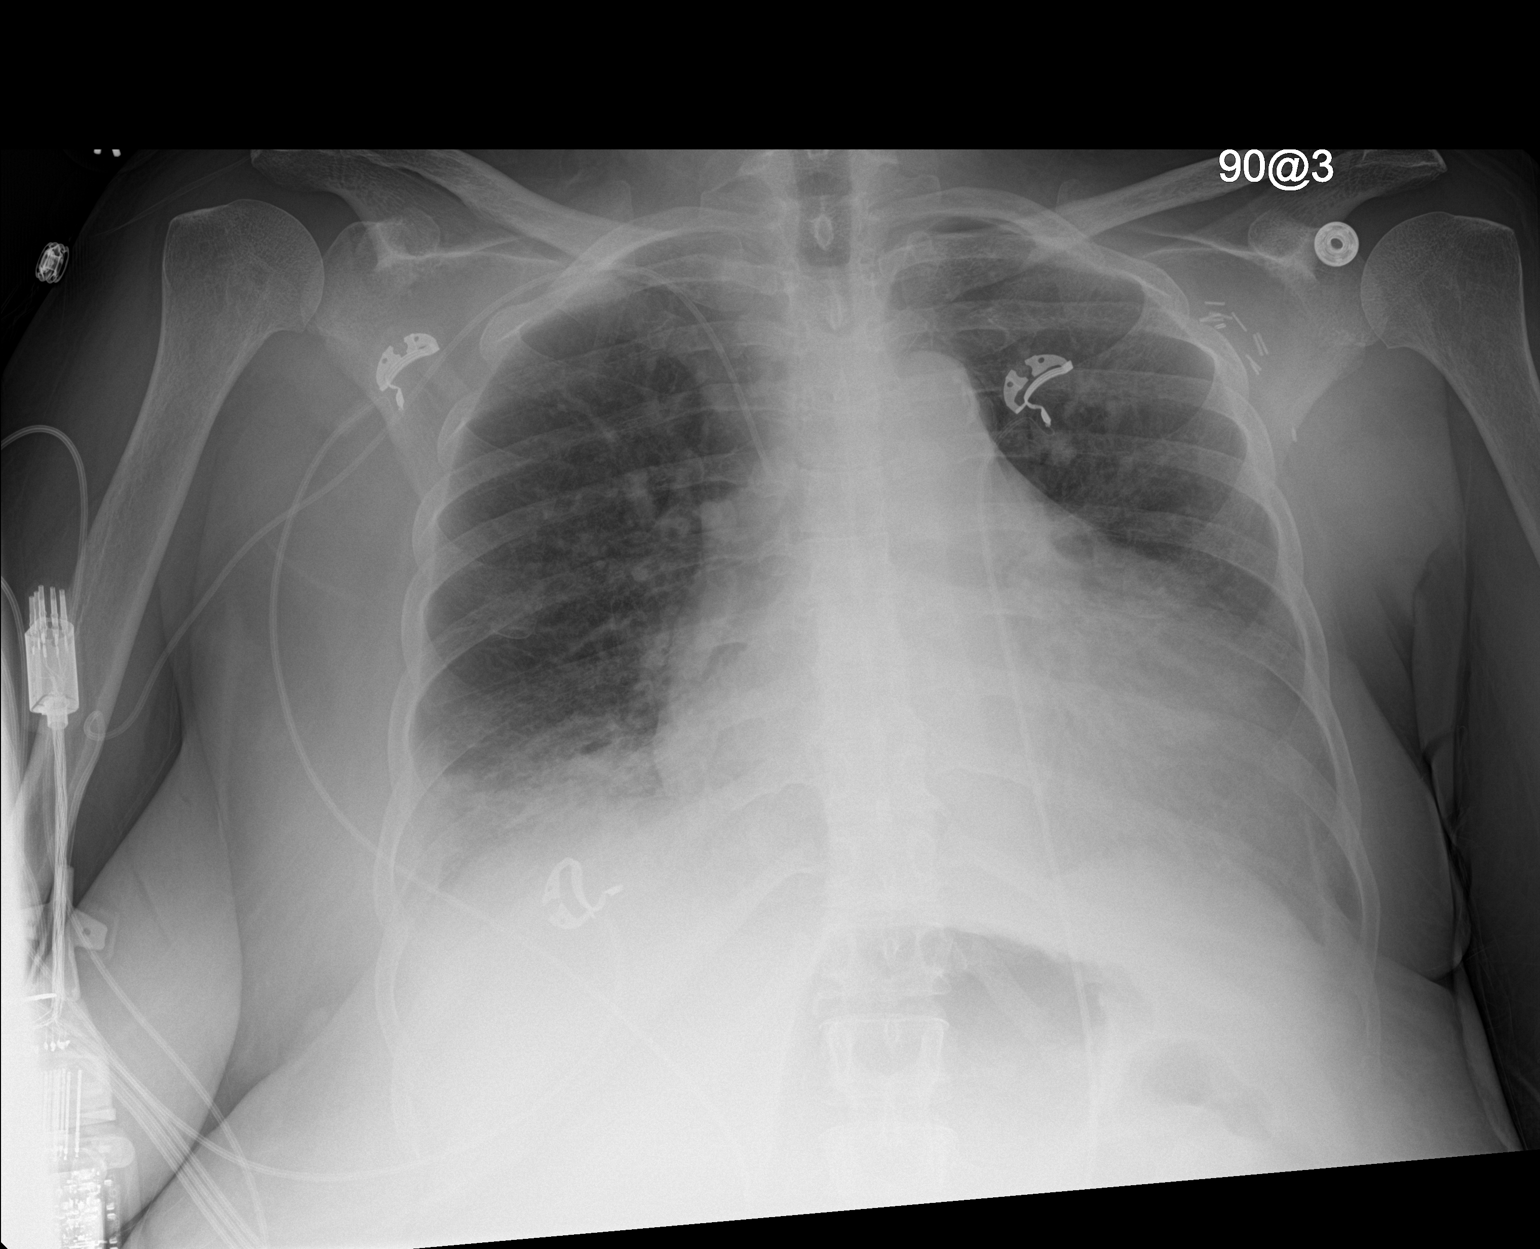

[1 of 1 positions shown; findings below may reference images not displayed]

FINDINGS: Right PICC terminates in upper third of the superior vena cava.
Stable cardiomediastinal silhouette with mild to moderate
cardiomegaly. No pneumothorax. Stable small bilateral pleural
effusions. Borderline mild pulmonary edema appears slightly
improved. Stable mild patchy bibasilar lung opacities.
IMPRESSION: 1. Borderline mild congestive heart failure, slightly improved.
2. Stable small bilateral pleural effusions.
3. Stable mild patchy bibasilar lung opacities, favor atelectasis.

## 2019-10-10 ENCOUNTER — Ambulatory Visit
Admission: RE | Admit: 2019-10-10 | Discharge: 2019-10-10 | Disposition: A | Discharge status: Home / Self Care | Payer: Managed Care, Other (non HMO) | Source: Ambulatory Visit | Attending: Adult Health | Admitting: Adult Health

## 2019-10-10 ENCOUNTER — Other Ambulatory Visit: Payer: Self-pay

## 2019-10-10 DIAGNOSIS — Z1231 Encounter for screening mammogram for malignant neoplasm of breast: Secondary | ICD-10-CM

## 2019-10-11 ENCOUNTER — Ambulatory Visit (HOSPITAL_COMMUNITY)
Admission: RE | Admit: 2019-10-11 | Discharge: 2019-10-11 | Disposition: A | Discharge status: Home / Self Care | Payer: No Typology Code available for payment source | Source: Ambulatory Visit | Attending: Adult Health | Admitting: Adult Health

## 2019-10-11 ENCOUNTER — Other Ambulatory Visit: Payer: Self-pay

## 2019-10-11 ENCOUNTER — Other Ambulatory Visit: Payer: Self-pay | Admitting: Adult Health

## 2019-10-11 DIAGNOSIS — C7951 Secondary malignant neoplasm of bone: Secondary | ICD-10-CM

## 2019-10-11 DIAGNOSIS — C50512 Malignant neoplasm of lower-outer quadrant of left female breast: Secondary | ICD-10-CM | POA: Diagnosis present

## 2019-10-11 DIAGNOSIS — Z170 Estrogen receptor positive status [ER+]: Secondary | ICD-10-CM

## 2019-10-11 DIAGNOSIS — R928 Other abnormal and inconclusive findings on diagnostic imaging of breast: Secondary | ICD-10-CM

## 2019-10-11 MED ORDER — TECHNETIUM TC 99M MEDRONATE IV KIT
19.8000 | PACK | Freq: Once | INTRAVENOUS | Status: AC
  Administered 2019-10-11: 11:00:00 19.8 via INTRAVENOUS

## 2019-10-16 NOTE — Progress Notes (Signed)
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 ER positive, PR negative, HER2 negative.   (12) refused denosumab/Xgeva or zoledronate, agreed to alendronate, started 04/20/2019  (30) fulvestrant and palbociclib starting 07/04/2019  (a) Palbociclib reduced on 08/03/2019 to 56m 3 weeks on and 1 week off  (b) Palbociclib further reduced to 75 mg every other day 3 weeks on and  1 week off on 09/03/2019  (c) non-contrast CT chest/abd/pelvis and bone scan 10/11/2019 shows stable to minimally progressive disease; no definite lung or liver lesions  (d) palbociclib discontinued November 2020 with decreasing counts  (e) abemaciclib started January 2021   PLAN:  [place] is no longer as anxious about her diagnosis and its treatment and in fact she is tolerating treatment well.  Her disease, which is primarily bone, appears well controlled on her current medicines, although it is sometimes difficult to tell whether 1 is seeing callus formation from healing on the bone or actual disease progression in the bone.  She has had decreased counts because of the palbociclib.  I do not think we can continue that medication much longer.  Instead we are going to switch to abemaciclib which is a little bit more bone marrow sparing.  However this can have more problems with diarrhea and we discussed that at length today.  Specifically she understands that if she does develop diarrhea after the first diarrheal bowel movements she will start Imodium and she may take it up to 4 times a day as needed.  I also asked her to call us if she does develop diarrhea.  The concern of course is dehydration and noticed that  she is on a diuretic as well for her other medical issues  I am hopeful we can start the abemaciclib started within the next 2 weeks.  She will return next week for her next fulvestrant dose and I will see her again with the one after that, which will be February 2.  She knows to call for any other issue that may develop before then.    Chauncey Cruel, MD Medical Oncology and Hematology [facility] [place], [place] [address] Tel. [phone]    Fax. [phone]   I, Wilburn Mylar, am acting as scribe for Dr. Virgie Dad. Devery Odwyer.  I, Lurline Del MD, have reviewed the above documentation for accuracy and completeness, and I agree with the above.

## 2019-10-17 ENCOUNTER — Inpatient Hospital Stay (HOSPITAL_BASED_OUTPATIENT_CLINIC_OR_DEPARTMENT_OTHER): Payer: Managed Care, Other (non HMO) | Admitting: Oncology

## 2019-10-17 ENCOUNTER — Telehealth: Payer: Self-pay | Admitting: Pharmacist

## 2019-10-17 ENCOUNTER — Other Ambulatory Visit: Payer: Self-pay

## 2019-10-17 ENCOUNTER — Encounter: Payer: Self-pay | Admitting: Pharmacist

## 2019-10-17 ENCOUNTER — Inpatient Hospital Stay: Payer: Managed Care, Other (non HMO)

## 2019-10-17 VITALS — BP 134/76 | HR 79 | Temp 98.9°F | Resp 20 | Ht 59.0 in | Wt 163.2 lb

## 2019-10-17 DIAGNOSIS — C7951 Secondary malignant neoplasm of bone: Secondary | ICD-10-CM

## 2019-10-17 DIAGNOSIS — C771 Secondary and unspecified malignant neoplasm of intrathoracic lymph nodes: Secondary | ICD-10-CM

## 2019-10-17 DIAGNOSIS — Z170 Estrogen receptor positive status [ER+]: Secondary | ICD-10-CM

## 2019-10-17 DIAGNOSIS — R634 Abnormal weight loss: Secondary | ICD-10-CM | POA: Diagnosis not present

## 2019-10-17 DIAGNOSIS — C50512 Malignant neoplasm of lower-outer quadrant of left female breast: Secondary | ICD-10-CM

## 2019-10-17 DIAGNOSIS — Z7189 Other specified counseling: Secondary | ICD-10-CM

## 2019-10-17 LAB — COMPREHENSIVE METABOLIC PANEL
ALT: 14 U/L (ref 0–44)
AST: 16 U/L (ref 15–41)
Albumin: 3.5 g/dL (ref 3.5–5.0)
Alkaline Phosphatase: 101 U/L (ref 38–126)
Anion gap: 13 (ref 5–15)
BUN: 51 mg/dL — ABNORMAL HIGH (ref 6–20)
CO2: 25 mmol/L (ref 22–32)
Calcium: 8.8 mg/dL — ABNORMAL LOW (ref 8.9–10.3)
Chloride: 102 mmol/L (ref 98–111)
Creatinine, Ser: 1.77 mg/dL — ABNORMAL HIGH (ref 0.44–1.00)
GFR calc Af Amer: 37 mL/min — ABNORMAL LOW (ref >60)
GFR calc non Af Amer: 32 mL/min — ABNORMAL LOW (ref >60)
Glucose, Bld: 90 mg/dL (ref 70–99)
Potassium: 4.4 mmol/L (ref 3.5–5.1)
Sodium: 140 mmol/L (ref 135–145)
Total Bilirubin: 0.3 mg/dL (ref 0.3–1.2)
Total Protein: 7 g/dL (ref 6.5–8.1)

## 2019-10-17 LAB — CBC WITH DIFFERENTIAL/PLATELET
Abs Immature Granulocytes: 0.04 10*3/uL (ref 0.00–0.07)
Basophils Absolute: 0 10*3/uL (ref 0.0–0.1)
Basophils Relative: 1 %
Eosinophils Absolute: 0.1 10*3/uL (ref 0.0–0.5)
Eosinophils Relative: 2 %
HCT: 22.7 % — ABNORMAL LOW (ref 36.0–46.0)
Hemoglobin: 7.2 g/dL — ABNORMAL LOW (ref 12.0–15.0)
Immature Granulocytes: 1 %
Lymphocytes Relative: 12 %
Lymphs Abs: 0.5 10*3/uL — ABNORMAL LOW (ref 0.7–4.0)
MCH: 30.4 pg (ref 26.0–34.0)
MCHC: 31.7 g/dL (ref 30.0–36.0)
MCV: 95.8 fL (ref 80.0–100.0)
Monocytes Absolute: 0.6 10*3/uL (ref 0.1–1.0)
Monocytes Relative: 13 %
Neutro Abs: 3 10*3/uL (ref 1.7–7.7)
Neutrophils Relative %: 71 %
Platelets: 201 10*3/uL (ref 150–400)
RBC: 2.37 MIL/uL — ABNORMAL LOW (ref 3.87–5.11)
RDW: 13 % (ref 11.5–15.5)
WBC: 4.2 10*3/uL (ref 4.0–10.5)
nRBC: 0 % (ref 0.0–0.2)

## 2019-10-17 MED ORDER — ABEMACICLIB 150 MG PO TABS: 150.0000 mg | ORAL_TABLET | Freq: Two times a day (BID) | ORAL | 6 refills | Status: DC

## 2019-10-17 NOTE — Telephone Encounter (Signed)
Encounter opened in error

## 2019-10-17 NOTE — Telephone Encounter (Signed)
Oral Oncology Pharmacist Encounter  Received new prescription for Verzenio (abemaciclib) for the treatment of metastatic breast cancer, ER positive/HER2 negative in conjunction with fulvestrant, planned duration until disease progression or unacceptable drug toxicity. Patient previously on Ibrance but had to have her dose continuously decrease due decreasing counts.  CBC/CMP from 10/17/2019 assessed, no relevant lab abnormalities. Prescription dose and frequency assessed.   Current medication list in Epic reviewed, no DDIs with abemaciclib identified.   Prescription has been e-scribed to the [facility] for benefits analysis and approval.  Oral Oncology Clinic will continue to follow for insurance authorization, copayment issues, initial counseling and start date.  Alyson N. Leonard, PharmD, BCPS, BCOP Hematology/Oncology Clinical Pharmacist [facility] Oral Chemotherapy [facility] [phone]  10/17/2019 5:21 PM  

## 2019-10-18 LAB — CANCER ANTIGEN 27.29: CA 27.29: 46.5 U/mL — ABNORMAL HIGH (ref 0.0–38.6)

## 2019-10-19 ENCOUNTER — Ambulatory Visit
Admission: RE | Admit: 2019-10-19 | Discharge: 2019-10-19 | Disposition: A | Discharge status: Home / Self Care | Payer: Managed Care, Other (non HMO) | Source: Ambulatory Visit | Attending: Adult Health | Admitting: Adult Health

## 2019-10-19 ENCOUNTER — Ambulatory Visit: Payer: Managed Care, Other (non HMO)

## 2019-10-19 ENCOUNTER — Other Ambulatory Visit: Payer: Self-pay

## 2019-10-19 DIAGNOSIS — R928 Other abnormal and inconclusive findings on diagnostic imaging of breast: Secondary | ICD-10-CM

## 2019-10-23 MED ORDER — ABEMACICLIB 150 MG PO TABS: 150.0000 mg | ORAL_TABLET | Freq: Two times a day (BID) | ORAL | 6 refills | Status: DC

## 2019-10-24 ENCOUNTER — Ambulatory Visit: Payer: 59 | Admitting: Adult Health

## 2019-10-24 ENCOUNTER — Inpatient Hospital Stay: Payer: No Typology Code available for payment source | Attending: Oncology

## 2019-10-24 ENCOUNTER — Other Ambulatory Visit: Payer: Self-pay | Admitting: *Deleted

## 2019-10-24 ENCOUNTER — Other Ambulatory Visit: Payer: Self-pay

## 2019-10-24 ENCOUNTER — Other Ambulatory Visit: Payer: 59

## 2019-10-24 ENCOUNTER — Ambulatory Visit
Admission: RE | Admit: 2019-10-24 | Discharge: 2019-10-24 | Disposition: A | Discharge status: Home / Self Care | Payer: Managed Care, Other (non HMO) | Source: Ambulatory Visit | Attending: Oncology | Admitting: Oncology

## 2019-10-24 VITALS — BP 134/78 | HR 78 | Temp 98.2°F | Resp 18

## 2019-10-24 DIAGNOSIS — Z170 Estrogen receptor positive status [ER+]: Secondary | ICD-10-CM | POA: Insufficient documentation

## 2019-10-24 DIAGNOSIS — M25559 Pain in unspecified hip: Secondary | ICD-10-CM | POA: Insufficient documentation

## 2019-10-24 DIAGNOSIS — C7951 Secondary malignant neoplasm of bone: Secondary | ICD-10-CM | POA: Diagnosis not present

## 2019-10-24 DIAGNOSIS — C50512 Malignant neoplasm of lower-outer quadrant of left female breast: Secondary | ICD-10-CM | POA: Insufficient documentation

## 2019-10-24 DIAGNOSIS — Z5111 Encounter for antineoplastic chemotherapy: Secondary | ICD-10-CM | POA: Insufficient documentation

## 2019-10-24 MED ORDER — FULVESTRANT 250 MG/5ML IM SOLN
500.0000 mg | Freq: Once | INTRAMUSCULAR | Status: AC
  Administered 2019-10-24: 500 mg via INTRAMUSCULAR

## 2019-10-24 NOTE — Telephone Encounter (Signed)
Oral Chemotherapy Pharmacist Encounter  Due to insurance restriction the medication could not be filled at [facility]. Prescription has been e-scribed to [facility].  Supportive information was faxed to [facility]. We will continue to follow medication access.   Darl Pikes, PharmD, BCPS, [facility] Hematology/Oncology Clinical Pharmacist [facility] [phone]  10/24/2019 5:34 PM

## 2019-10-24 NOTE — Patient Instructions (Signed)
Fulvestrant injection What is this medicine? FULVESTRANT (ful VES trant) blocks the effects of estrogen. It is used to treat breast cancer. This medicine may be used for other purposes; ask your health care provider or pharmacist if you have questions. COMMON BRAND NAME(S): FASLODEX What should I tell my health care provider before I take this medicine? They need to know if you have any of these conditions:  bleeding disorders  liver disease  low blood counts, like low white cell, platelet, or red cell counts  an unusual or allergic reaction to fulvestrant, other medicines, foods, dyes, or preservatives  pregnant or trying to get pregnant  breast-feeding How should I use this medicine? This medicine is for injection into a muscle. It is usually given by a health care professional in a hospital or clinic setting. Talk to your pediatrician regarding the use of this medicine in children. Special care may be needed. Overdosage: If you think you have taken too much of this medicine contact a poison control center or emergency room at once. NOTE: This medicine is only for you. Do not share this medicine with others. What if I miss a dose? It is important not to miss your dose. Call your doctor or health care professional if you are unable to keep an appointment. What may interact with this medicine?  medicines that treat or prevent blood clots like warfarin, enoxaparin, dalteparin, apixaban, dabigatran, and rivaroxaban This list may not describe all possible interactions. Give your health care provider a list of all the medicines, herbs, non-prescription drugs, or dietary supplements you use. Also tell them if you smoke, drink alcohol, or use illegal drugs. Some items may interact with your medicine. What should I watch for while using this medicine? Your condition will be monitored carefully while you are receiving this medicine. You will need important blood work done while you are taking  this medicine. Do not become pregnant while taking this medicine or for at least 1 year after stopping it. Women of child-bearing potential will need to have a negative pregnancy test before starting this medicine. Women should inform their doctor if they wish to become pregnant or think they might be pregnant. There is a potential for serious side effects to an unborn child. Men should inform their doctors if they wish to father a child. This medicine may lower sperm counts. Talk to your health care professional or pharmacist for more information. Do not breast-feed an infant while taking this medicine or for 1 year after the last dose. What side effects may I notice from receiving this medicine? Side effects that you should report to your doctor or health care professional as soon as possible:  allergic reactions like skin rash, itching or hives, swelling of the face, lips, or tongue  feeling faint or lightheaded, falls  pain, tingling, numbness, or weakness in the legs  signs and symptoms of infection like fever or chills; cough; flu-like symptoms; sore throat  vaginal bleeding Side effects that usually do not require medical attention (report to your doctor or health care professional if they continue or are bothersome):  aches, pains  constipation  diarrhea  headache  hot flashes  nausea, vomiting  pain at site where injected  stomach pain This list may not describe all possible side effects. Call your doctor for medical advice about side effects. You may report side effects to FDA at 1-800-FDA-1088. Where should I keep my medicine? This drug is given in a hospital or clinic and will   not be stored at home. NOTE: This sheet is a summary. It may not cover all possible information. If you have questions about this medicine, talk to your doctor, pharmacist, or health care provider.  2020 Elsevier/Gold Standard (2018-01-13 11:34:41)  

## 2019-10-26 ENCOUNTER — Encounter: Payer: Self-pay | Admitting: Pharmacist

## 2019-10-26 NOTE — Telephone Encounter (Addendum)
Erroneous Encounter
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
## 2019-11-21 ENCOUNTER — Inpatient Hospital Stay (HOSPITAL_BASED_OUTPATIENT_CLINIC_OR_DEPARTMENT_OTHER): Payer: No Typology Code available for payment source | Admitting: Oncology

## 2019-11-21 ENCOUNTER — Inpatient Hospital Stay: Payer: No Typology Code available for payment source

## 2019-11-21 ENCOUNTER — Other Ambulatory Visit: Payer: Self-pay

## 2019-11-21 ENCOUNTER — Inpatient Hospital Stay: Payer: No Typology Code available for payment source | Attending: Oncology

## 2019-11-21 VITALS — BP 141/80 | HR 76 | Temp 98.2°F | Resp 18 | Ht 59.0 in | Wt 163.9 lb

## 2019-11-21 DIAGNOSIS — Z5111 Encounter for antineoplastic chemotherapy: Secondary | ICD-10-CM | POA: Diagnosis present

## 2019-11-21 DIAGNOSIS — Z923 Personal history of irradiation: Secondary | ICD-10-CM | POA: Diagnosis not present

## 2019-11-21 DIAGNOSIS — M3214 Glomerular disease in systemic lupus erythematosus: Secondary | ICD-10-CM | POA: Diagnosis not present

## 2019-11-21 DIAGNOSIS — R197 Diarrhea, unspecified: Secondary | ICD-10-CM | POA: Diagnosis not present

## 2019-11-21 DIAGNOSIS — C771 Secondary and unspecified malignant neoplasm of intrathoracic lymph nodes: Secondary | ICD-10-CM

## 2019-11-21 DIAGNOSIS — R1110 Vomiting, unspecified: Secondary | ICD-10-CM | POA: Diagnosis not present

## 2019-11-21 DIAGNOSIS — E669 Obesity, unspecified: Secondary | ICD-10-CM

## 2019-11-21 DIAGNOSIS — Z79899 Other long term (current) drug therapy: Secondary | ICD-10-CM | POA: Diagnosis not present

## 2019-11-21 DIAGNOSIS — C7951 Secondary malignant neoplasm of bone: Secondary | ICD-10-CM

## 2019-11-21 DIAGNOSIS — Z170 Estrogen receptor positive status [ER+]: Secondary | ICD-10-CM | POA: Diagnosis not present

## 2019-11-21 DIAGNOSIS — M328 Other forms of systemic lupus erythematosus: Secondary | ICD-10-CM

## 2019-11-21 DIAGNOSIS — Z833 Family history of diabetes mellitus: Secondary | ICD-10-CM | POA: Diagnosis not present

## 2019-11-21 DIAGNOSIS — Z7189 Other specified counseling: Secondary | ICD-10-CM

## 2019-11-21 DIAGNOSIS — D649 Anemia, unspecified: Secondary | ICD-10-CM | POA: Diagnosis not present

## 2019-11-21 DIAGNOSIS — C50512 Malignant neoplasm of lower-outer quadrant of left female breast: Secondary | ICD-10-CM

## 2019-11-21 DIAGNOSIS — Z9221 Personal history of antineoplastic chemotherapy: Secondary | ICD-10-CM | POA: Diagnosis not present

## 2019-11-21 DIAGNOSIS — E860 Dehydration: Secondary | ICD-10-CM | POA: Insufficient documentation

## 2019-11-21 DIAGNOSIS — I130 Hypertensive heart and chronic kidney disease with heart failure and stage 1 through stage 4 chronic kidney disease, or unspecified chronic kidney disease: Secondary | ICD-10-CM | POA: Insufficient documentation

## 2019-11-21 DIAGNOSIS — N189 Chronic kidney disease, unspecified: Secondary | ICD-10-CM | POA: Insufficient documentation

## 2019-11-21 LAB — CBC WITH DIFFERENTIAL/PLATELET
Abs Immature Granulocytes: 0 10*3/uL (ref 0.00–0.07)
Basophils Absolute: 0 10*3/uL (ref 0.0–0.1)
Basophils Relative: 0 %
Eosinophils Absolute: 0.1 10*3/uL (ref 0.0–0.5)
Eosinophils Relative: 3 %
HCT: 23.7 % — ABNORMAL LOW (ref 36.0–46.0)
Hemoglobin: 7.7 g/dL — ABNORMAL LOW (ref 12.0–15.0)
Immature Granulocytes: 0 %
Lymphocytes Relative: 24 %
Lymphs Abs: 0.7 10*3/uL (ref 0.7–4.0)
MCH: 30.2 pg (ref 26.0–34.0)
MCHC: 32.5 g/dL (ref 30.0–36.0)
MCV: 92.9 fL (ref 80.0–100.0)
Monocytes Absolute: 0.3 10*3/uL (ref 0.1–1.0)
Monocytes Relative: 11 %
Neutro Abs: 1.9 10*3/uL (ref 1.7–7.7)
Neutrophils Relative %: 62 %
Platelets: 193 10*3/uL (ref 150–400)
RBC: 2.55 MIL/uL — ABNORMAL LOW (ref 3.87–5.11)
RDW: 12.1 % (ref 11.5–15.5)
WBC: 3 10*3/uL — ABNORMAL LOW (ref 4.0–10.5)
nRBC: 0 % (ref 0.0–0.2)

## 2019-11-21 LAB — COMPREHENSIVE METABOLIC PANEL
ALT: 11 U/L (ref 0–44)
AST: 13 U/L — ABNORMAL LOW (ref 15–41)
Albumin: 3.3 g/dL — ABNORMAL LOW (ref 3.5–5.0)
Alkaline Phosphatase: 86 U/L (ref 38–126)
Anion gap: 11 (ref 5–15)
BUN: 57 mg/dL — ABNORMAL HIGH (ref 6–20)
CO2: 25 mmol/L (ref 22–32)
Calcium: 8.4 mg/dL — ABNORMAL LOW (ref 8.9–10.3)
Chloride: 107 mmol/L (ref 98–111)
Creatinine, Ser: 1.76 mg/dL — ABNORMAL HIGH (ref 0.44–1.00)
GFR calc Af Amer: 37 mL/min — ABNORMAL LOW (ref >60)
GFR calc non Af Amer: 32 mL/min — ABNORMAL LOW (ref >60)
Glucose, Bld: 96 mg/dL (ref 70–99)
Potassium: 3.6 mmol/L (ref 3.5–5.1)
Sodium: 143 mmol/L (ref 135–145)
Total Bilirubin: <0.2 mg/dL — ABNORMAL LOW (ref 0.3–1.2)
Total Protein: 6.9 g/dL (ref 6.5–8.1)

## 2019-11-21 MED ORDER — ABEMACICLIB 150 MG PO TABS: 150.0000 mg | ORAL_TABLET | Freq: Two times a day (BID) | ORAL | 0 refills | Status: DC

## 2019-11-21 MED ORDER — FULVESTRANT 250 MG/5ML IM SOLN
500.0000 mg | Freq: Once | INTRAMUSCULAR | Status: AC
  Administered 2019-11-21: 500 mg via INTRAMUSCULAR

## 2019-11-21 MED ORDER — FULVESTRANT 250 MG/5ML IM SOLN
INTRAMUSCULAR | Status: AC
  Filled 2019-11-21: qty 10

## 2019-11-21 NOTE — Patient Instructions (Signed)
Fulvestrant injection What is this medicine? FULVESTRANT (ful VES trant) blocks the effects of estrogen. It is used to treat breast cancer. This medicine may be used for other purposes; ask your health care provider or pharmacist if you have questions. COMMON BRAND NAME(S): FASLODEX What should I tell my health care provider before I take this medicine? They need to know if you have any of these conditions:  bleeding disorders  liver disease  low blood counts, like low white cell, platelet, or red cell counts  an unusual or allergic reaction to fulvestrant, other medicines, foods, dyes, or preservatives  pregnant or trying to get pregnant  breast-feeding How should I use this medicine? This medicine is for injection into a muscle. It is usually given by a health care professional in a hospital or clinic setting. Talk to your pediatrician regarding the use of this medicine in children. Special care may be needed. Overdosage: If you think you have taken too much of this medicine contact a poison control center or emergency room at once. NOTE: This medicine is only for you. Do not share this medicine with others. What if I miss a dose? It is important not to miss your dose. Call your doctor or health care professional if you are unable to keep an appointment. What may interact with this medicine?  medicines that treat or prevent blood clots like warfarin, enoxaparin, dalteparin, apixaban, dabigatran, and rivaroxaban This list may not describe all possible interactions. Give your health care provider a list of all the medicines, herbs, non-prescription drugs, or dietary supplements you use. Also tell them if you smoke, drink alcohol, or use illegal drugs. Some items may interact with your medicine. What should I watch for while using this medicine? Your condition will be monitored carefully while you are receiving this medicine. You will need important blood work done while you are taking  this medicine. Do not become pregnant while taking this medicine or for at least 1 year after stopping it. Women of child-bearing potential will need to have a negative pregnancy test before starting this medicine. Women should inform their doctor if they wish to become pregnant or think they might be pregnant. There is a potential for serious side effects to an unborn child. Men should inform their doctors if they wish to father a child. This medicine may lower sperm counts. Talk to your health care professional or pharmacist for more information. Do not breast-feed an infant while taking this medicine or for 1 year after the last dose. What side effects may I notice from receiving this medicine? Side effects that you should report to your doctor or health care professional as soon as possible:  allergic reactions like skin rash, itching or hives, swelling of the face, lips, or tongue  feeling faint or lightheaded, falls  pain, tingling, numbness, or weakness in the legs  signs and symptoms of infection like fever or chills; cough; flu-like symptoms; sore throat  vaginal bleeding Side effects that usually do not require medical attention (report to your doctor or health care professional if they continue or are bothersome):  aches, pains  constipation  diarrhea  headache  hot flashes  nausea, vomiting  pain at site where injected  stomach pain This list may not describe all possible side effects. Call your doctor for medical advice about side effects. You may report side effects to FDA at 1-800-FDA-1088. Where should I keep my medicine? This drug is given in a hospital or clinic and will   not be stored at home. NOTE: This sheet is a summary. It may not cover all possible information. If you have questions about this medicine, talk to your doctor, pharmacist, or health care provider.  2020 Elsevier/Gold Standard (2018-01-13 11:34:41)  

## 2019-11-21 NOTE — Telephone Encounter (Signed)
Oral Chemotherapy Pharmacist Encounter  [facility] will fill Verzenio for Ms. Hagey using a one time one month free voucher. Medication will be delivered on 11/23/19. Patient knows to get started when she receives the medication.  Patient Education I spoke with patient for overview of new oral chemotherapy medication: Verzenio (abemaciclib) for the treatment of metastatic breast cancer, ER positive/HER2 negative in conjunction with fulvestrant, planned duration until disease progression or unacceptable drug toxicity. Patient previously on Ibrance but had to have her dose continuously decrease due decreasing counts.   Counseled patient on administration, dosing, side effects, monitoring, drug-food interactions, safe handling, storage, and disposal. Patient will take 1 tablet (150 mg total) by mouth 2 (two) times daily.  Side effects include but not limited to: diarrhea, N/V, fatigue, decreased wbc.   Instructed patient to pick up some loperamide to have on hand.  Reviewed with patient importance of keeping a medication schedule and plan for any missed doses.  Ms. Guttierrez voiced understanding and appreciation. All questions answered. Medication handout placed in the mail.  Provided patient with [facility] phone number. Patient knows to call the office with questions or concerns. Oral [facility]otherapy [facility] will continue to follow.  Darl Pikes, PharmD, BCPS, [facility] Hematology/Oncology Clinical Pharmacist [facility]/AP [facility] [phone]  11/21/2019 5:36 PM

## 2019-11-22 ENCOUNTER — Telehealth: Payer: Self-pay | Admitting: Oncology

## 2019-11-22 ENCOUNTER — Telehealth: Payer: Self-pay | Admitting: Pharmacist

## 2019-11-22 DIAGNOSIS — Z170 Estrogen receptor positive status [ER+]: Secondary | ICD-10-CM

## 2019-11-22 DIAGNOSIS — C50512 Malignant neoplasm of lower-outer quadrant of left female breast: Secondary | ICD-10-CM

## 2019-11-22 LAB — CANCER ANTIGEN 27.29: CA 27.29: 40.4 U/mL — ABNORMAL HIGH (ref 0.0–38.6)

## 2019-11-22 MED FILL — VERZENIO 150 MG TAB: 150 | 28 days supply | Qty: 56 | Fill #0

## 2019-11-22 NOTE — Telephone Encounter (Signed)
I talk with patient regarding schedule  

## 2019-11-24 ENCOUNTER — Telehealth: Payer: Self-pay | Admitting: Radiology

## 2019-11-24 NOTE — Telephone Encounter (Signed)
Due to the pandemic mail was held up and several IFOB kits were received in the [facility] lab on approximately January 20th 2021 dating back as far as Septometer 2020. New IFOB kits have been mailed as of November 23, 2019 to all patients affected along with written communication as to why they received another IFOB and that they were not charges for the prior test.   

## 2019-11-29 ENCOUNTER — Telehealth: Payer: Self-pay | Admitting: *Deleted

## 2019-11-29 NOTE — Telephone Encounter (Addendum)
This RN spoke with pt per her VM- stating she is having side effects from the Verzenio.  Tashea states she has had 4 diarrhea stools this AM before 10 am - she took 2 imodium at 1030 with only a small stool after.  At 144pm - she has had no further diarrhea.  Tyniya states she starting the Verzenio on 2/4 and had 1 episode of vomiting and then diarrhea- she took 2 imodium with control.  She states she has at least 1 loose stool a day and up to 3 a couple of days.   She has had imodium at the most - 3 tabs a day.  Today is the first day she has had increased diarrhea of up to 4 stools within a short period.  All stools are " watery" in consistency.  Pt's current Verzenio dose is 150 mg bid.  She states she is able to drink 40 ounces of fluid daily but her appetite is greatly affected as well as early satiety.  She states primary concerns are the ongoing diarrhea and decreased appetite and intake.  This RN reviewed known side effects of diarrhea per use of this drug with goal of preventing with Imodium in advance.  This RN recommended for to to take another imodium now and may repeat 1 tablet every 4 hours if stools continue- with max dosing of 8 tablets a day.   She should take imodium at least 30 minutes prior to taking the Verzenio as a premed for the known side effect of diarrhea.  Reviewed goal of fluid intake to be twice as much as she is currently taking - and this RN offered for pt to come in later this week for IV hydration.  Reviewed eating small frequent meals in the [employer].  This RN reiterated goal with drug is to prevent known side effect of diarrhea at best tolerated dose.  Camie states she would prefer to do above regimen as well as increase her fluid intake- she will call this RN tomorrow with update on her status.  Return call number given as [phone].

## 2019-11-30 ENCOUNTER — Other Ambulatory Visit: Payer: Self-pay | Admitting: *Deleted

## 2019-11-30 DIAGNOSIS — R197 Diarrhea, unspecified: Secondary | ICD-10-CM

## 2019-11-30 MED ORDER — SODIUM CHLORIDE 0.9 % IV SOLN
INTRAVENOUS | Status: DC
  Filled 2019-11-30: qty 250

## 2019-12-01 ENCOUNTER — Telehealth: Payer: Self-pay | Admitting: Oncology

## 2019-12-01 ENCOUNTER — Other Ambulatory Visit: Payer: Self-pay

## 2019-12-01 ENCOUNTER — Inpatient Hospital Stay (HOSPITAL_BASED_OUTPATIENT_CLINIC_OR_DEPARTMENT_OTHER): Payer: No Typology Code available for payment source | Admitting: Oncology

## 2019-12-01 ENCOUNTER — Inpatient Hospital Stay: Payer: No Typology Code available for payment source

## 2019-12-01 ENCOUNTER — Telehealth: Payer: Self-pay | Admitting: *Deleted

## 2019-12-01 DIAGNOSIS — C50512 Malignant neoplasm of lower-outer quadrant of left female breast: Secondary | ICD-10-CM

## 2019-12-01 DIAGNOSIS — E860 Dehydration: Secondary | ICD-10-CM | POA: Insufficient documentation

## 2019-12-01 DIAGNOSIS — R197 Diarrhea, unspecified: Secondary | ICD-10-CM

## 2019-12-01 DIAGNOSIS — Z170 Estrogen receptor positive status [ER+]: Secondary | ICD-10-CM

## 2019-12-01 DIAGNOSIS — Z7189 Other specified counseling: Secondary | ICD-10-CM

## 2019-12-01 DIAGNOSIS — C7951 Secondary malignant neoplasm of bone: Secondary | ICD-10-CM

## 2019-12-01 DIAGNOSIS — M3214 Glomerular disease in systemic lupus erythematosus: Secondary | ICD-10-CM

## 2019-12-01 DIAGNOSIS — C771 Secondary and unspecified malignant neoplasm of intrathoracic lymph nodes: Secondary | ICD-10-CM | POA: Diagnosis not present

## 2019-12-01 LAB — CMP (CANCER CENTER ONLY)
ALT: 16 U/L (ref 0–44)
AST: 13 U/L — ABNORMAL LOW (ref 15–41)
Albumin: 3.6 g/dL (ref 3.5–5.0)
Alkaline Phosphatase: 107 U/L (ref 38–126)
Anion gap: 12 (ref 5–15)
BUN: 65 mg/dL — ABNORMAL HIGH (ref 6–20)
CO2: 21 mmol/L — ABNORMAL LOW (ref 22–32)
Calcium: 8.7 mg/dL — ABNORMAL LOW (ref 8.9–10.3)
Chloride: 108 mmol/L (ref 98–111)
Creatinine: 3.63 mg/dL (ref 0.44–1.00)
GFR, Est AFR Am: 15 mL/min — ABNORMAL LOW (ref >60)
GFR, Estimated: 13 mL/min — ABNORMAL LOW (ref >60)
Glucose, Bld: 92 mg/dL (ref 70–99)
Potassium: 3.3 mmol/L — ABNORMAL LOW (ref 3.5–5.1)
Sodium: 141 mmol/L (ref 135–145)
Total Bilirubin: 0.2 mg/dL — ABNORMAL LOW (ref 0.3–1.2)
Total Protein: 7.5 g/dL (ref 6.5–8.1)

## 2019-12-01 LAB — CBC WITH DIFFERENTIAL (CANCER CENTER ONLY)
Abs Immature Granulocytes: 0.01 10*3/uL (ref 0.00–0.07)
Basophils Absolute: 0 10*3/uL (ref 0.0–0.1)
Basophils Relative: 0 %
Eosinophils Absolute: 0.1 10*3/uL (ref 0.0–0.5)
Eosinophils Relative: 3 %
HCT: 25 % — ABNORMAL LOW (ref 36.0–46.0)
Hemoglobin: 8.1 g/dL — ABNORMAL LOW (ref 12.0–15.0)
Immature Granulocytes: 0 %
Lymphocytes Relative: 26 %
Lymphs Abs: 0.7 10*3/uL (ref 0.7–4.0)
MCH: 29.3 pg (ref 26.0–34.0)
MCHC: 32.4 g/dL (ref 30.0–36.0)
MCV: 90.6 fL (ref 80.0–100.0)
Monocytes Absolute: 0.1 10*3/uL (ref 0.1–1.0)
Monocytes Relative: 3 %
Neutro Abs: 1.8 10*3/uL (ref 1.7–7.7)
Neutrophils Relative %: 68 %
Platelet Count: 181 10*3/uL (ref 150–400)
RBC: 2.76 MIL/uL — ABNORMAL LOW (ref 3.87–5.11)
RDW: 12.1 % (ref 11.5–15.5)
WBC Count: 2.7 10*3/uL — ABNORMAL LOW (ref 4.0–10.5)
nRBC: 0 % (ref 0.0–0.2)

## 2019-12-01 MED ORDER — SODIUM CHLORIDE 0.9 % IV SOLN
1000.0000 mL | Freq: Once | INTRAVENOUS | Status: AC
  Administered 2019-12-01: 1000 mL via INTRAVENOUS
  Filled 2019-12-01: qty 1000

## 2019-12-01 NOTE — Telephone Encounter (Signed)
This RN spoke with pt yesterday per her call stating she is continuing to have diarrhea and some vomiting with Verzenio - and use of immodium.  Per MD- pt is to hold Verzenio and come in today for IVF and MD assessment- discussed with pt and appointment made.

## 2019-12-01 NOTE — Telephone Encounter (Signed)
I talk with patient regarding 2/24
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
## 2019-12-01 NOTE — Patient Instructions (Signed)

## 2019-12-02 ENCOUNTER — Inpatient Hospital Stay: Payer: No Typology Code available for payment source

## 2019-12-02 VITALS — BP 139/88 | HR 86 | Temp 98.7°F | Resp 16 | Ht 59.0 in

## 2019-12-02 DIAGNOSIS — E860 Dehydration: Secondary | ICD-10-CM

## 2019-12-02 DIAGNOSIS — C50512 Malignant neoplasm of lower-outer quadrant of left female breast: Secondary | ICD-10-CM | POA: Diagnosis not present

## 2019-12-02 MED ORDER — SODIUM CHLORIDE 0.9 % IV SOLN
Freq: Once | INTRAVENOUS | Status: AC
  Filled 2019-12-02: qty 250

## 2019-12-02 NOTE — Patient Instructions (Signed)
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
## 2019-12-13 ENCOUNTER — Inpatient Hospital Stay: Payer: No Typology Code available for payment source

## 2019-12-13 ENCOUNTER — Other Ambulatory Visit: Payer: Self-pay

## 2019-12-13 ENCOUNTER — Inpatient Hospital Stay (HOSPITAL_BASED_OUTPATIENT_CLINIC_OR_DEPARTMENT_OTHER): Payer: No Typology Code available for payment source | Admitting: Oncology

## 2019-12-13 VITALS — BP 116/77 | HR 77 | Temp 98.7°F | Resp 18 | Ht 59.0 in | Wt 160.8 lb

## 2019-12-13 DIAGNOSIS — C771 Secondary and unspecified malignant neoplasm of intrathoracic lymph nodes: Secondary | ICD-10-CM

## 2019-12-13 DIAGNOSIS — Z170 Estrogen receptor positive status [ER+]: Secondary | ICD-10-CM

## 2019-12-13 DIAGNOSIS — D561 Beta thalassemia: Secondary | ICD-10-CM

## 2019-12-13 DIAGNOSIS — E785 Hyperlipidemia, unspecified: Secondary | ICD-10-CM

## 2019-12-13 DIAGNOSIS — M328 Other forms of systemic lupus erythematosus: Secondary | ICD-10-CM

## 2019-12-13 DIAGNOSIS — D61818 Other pancytopenia: Secondary | ICD-10-CM

## 2019-12-13 DIAGNOSIS — I10 Essential (primary) hypertension: Secondary | ICD-10-CM

## 2019-12-13 DIAGNOSIS — C50512 Malignant neoplasm of lower-outer quadrant of left female breast: Secondary | ICD-10-CM

## 2019-12-13 DIAGNOSIS — M3214 Glomerular disease in systemic lupus erythematosus: Secondary | ICD-10-CM

## 2019-12-13 DIAGNOSIS — C7951 Secondary malignant neoplasm of bone: Secondary | ICD-10-CM | POA: Diagnosis not present

## 2019-12-13 LAB — COMPREHENSIVE METABOLIC PANEL
ALT: 8 U/L (ref 0–44)
AST: 12 U/L — ABNORMAL LOW (ref 15–41)
Albumin: 3.3 g/dL — ABNORMAL LOW (ref 3.5–5.0)
Alkaline Phosphatase: 83 U/L (ref 38–126)
Anion gap: 10 (ref 5–15)
BUN: 45 mg/dL — ABNORMAL HIGH (ref 6–20)
CO2: 25 mmol/L (ref 22–32)
Calcium: 8.3 mg/dL — ABNORMAL LOW (ref 8.9–10.3)
Chloride: 106 mmol/L (ref 98–111)
Creatinine, Ser: 1.74 mg/dL — ABNORMAL HIGH (ref 0.44–1.00)
GFR calc Af Amer: 38 mL/min — ABNORMAL LOW (ref >60)
GFR calc non Af Amer: 32 mL/min — ABNORMAL LOW (ref >60)
Glucose, Bld: 92 mg/dL (ref 70–99)
Potassium: 3.5 mmol/L (ref 3.5–5.1)
Sodium: 141 mmol/L (ref 135–145)
Total Bilirubin: 0.2 mg/dL — ABNORMAL LOW (ref 0.3–1.2)
Total Protein: 6.7 g/dL (ref 6.5–8.1)

## 2019-12-13 LAB — CBC WITH DIFFERENTIAL/PLATELET
Abs Immature Granulocytes: 0 10*3/uL (ref 0.00–0.07)
Basophils Absolute: 0 10*3/uL (ref 0.0–0.1)
Basophils Relative: 1 %
Eosinophils Absolute: 0.1 10*3/uL (ref 0.0–0.5)
Eosinophils Relative: 3 %
HCT: 22.3 % — ABNORMAL LOW (ref 36.0–46.0)
Hemoglobin: 7.3 g/dL — ABNORMAL LOW (ref 12.0–15.0)
Immature Granulocytes: 0 %
Lymphocytes Relative: 34 %
Lymphs Abs: 0.7 10*3/uL (ref 0.7–4.0)
MCH: 29.2 pg (ref 26.0–34.0)
MCHC: 32.7 g/dL (ref 30.0–36.0)
MCV: 89.2 fL (ref 80.0–100.0)
Monocytes Absolute: 0.3 10*3/uL (ref 0.1–1.0)
Monocytes Relative: 14 %
Neutro Abs: 1 10*3/uL — ABNORMAL LOW (ref 1.7–7.7)
Neutrophils Relative %: 48 %
Platelets: 128 10*3/uL — ABNORMAL LOW (ref 150–400)
RBC: 2.5 MIL/uL — ABNORMAL LOW (ref 3.87–5.11)
RDW: 12.4 % (ref 11.5–15.5)
WBC: 2 10*3/uL — ABNORMAL LOW (ref 4.0–10.5)
nRBC: 0 % (ref 0.0–0.2)

## 2019-12-13 MED ORDER — ABEMACICLIB 150 MG PO TABS: 150.0000 mg | ORAL_TABLET | Freq: Every day | ORAL | 0 refills | Status: DC

## 2019-12-13 MED ORDER — CHOLESTYRAMINE 4 G PO PACK: 4.0000 g | PACK | Freq: Every day | ORAL | 12 refills | Status: DC
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
## 2019-12-19 ENCOUNTER — Inpatient Hospital Stay (HOSPITAL_BASED_OUTPATIENT_CLINIC_OR_DEPARTMENT_OTHER): Payer: No Typology Code available for payment source | Admitting: Oncology

## 2019-12-19 ENCOUNTER — Inpatient Hospital Stay: Payer: No Typology Code available for payment source | Attending: Oncology

## 2019-12-19 ENCOUNTER — Inpatient Hospital Stay: Payer: No Typology Code available for payment source

## 2019-12-19 ENCOUNTER — Other Ambulatory Visit: Payer: Self-pay

## 2019-12-19 VITALS — BP 123/66 | HR 75 | Temp 98.7°F | Resp 18 | Ht 59.0 in | Wt 161.4 lb

## 2019-12-19 DIAGNOSIS — D649 Anemia, unspecified: Secondary | ICD-10-CM | POA: Insufficient documentation

## 2019-12-19 DIAGNOSIS — Z79810 Long term (current) use of selective estrogen receptor modulators (SERMs): Secondary | ICD-10-CM | POA: Diagnosis not present

## 2019-12-19 DIAGNOSIS — I10 Essential (primary) hypertension: Secondary | ICD-10-CM | POA: Diagnosis not present

## 2019-12-19 DIAGNOSIS — C771 Secondary and unspecified malignant neoplasm of intrathoracic lymph nodes: Secondary | ICD-10-CM

## 2019-12-19 DIAGNOSIS — Z923 Personal history of irradiation: Secondary | ICD-10-CM | POA: Diagnosis not present

## 2019-12-19 DIAGNOSIS — Z9221 Personal history of antineoplastic chemotherapy: Secondary | ICD-10-CM | POA: Diagnosis not present

## 2019-12-19 DIAGNOSIS — Z5111 Encounter for antineoplastic chemotherapy: Secondary | ICD-10-CM | POA: Diagnosis present

## 2019-12-19 DIAGNOSIS — C50912 Malignant neoplasm of unspecified site of left female breast: Secondary | ICD-10-CM | POA: Diagnosis not present

## 2019-12-19 DIAGNOSIS — C7951 Secondary malignant neoplasm of bone: Secondary | ICD-10-CM

## 2019-12-19 DIAGNOSIS — Z170 Estrogen receptor positive status [ER+]: Secondary | ICD-10-CM | POA: Insufficient documentation

## 2019-12-19 DIAGNOSIS — Z803 Family history of malignant neoplasm of breast: Secondary | ICD-10-CM | POA: Insufficient documentation

## 2019-12-19 DIAGNOSIS — N189 Chronic kidney disease, unspecified: Secondary | ICD-10-CM | POA: Diagnosis not present

## 2019-12-19 DIAGNOSIS — C50512 Malignant neoplasm of lower-outer quadrant of left female breast: Secondary | ICD-10-CM

## 2019-12-19 DIAGNOSIS — Z79899 Other long term (current) drug therapy: Secondary | ICD-10-CM | POA: Insufficient documentation

## 2019-12-19 DIAGNOSIS — Z9012 Acquired absence of left breast and nipple: Secondary | ICD-10-CM | POA: Diagnosis not present

## 2019-12-19 LAB — CBC WITH DIFFERENTIAL/PLATELET
Abs Immature Granulocytes: 0.01 10*3/uL (ref 0.00–0.07)
Basophils Absolute: 0 10*3/uL (ref 0.0–0.1)
Basophils Relative: 0 %
Eosinophils Absolute: 0.1 10*3/uL (ref 0.0–0.5)
Eosinophils Relative: 2 %
HCT: 22.7 % — ABNORMAL LOW (ref 36.0–46.0)
Hemoglobin: 7.6 g/dL — ABNORMAL LOW (ref 12.0–15.0)
Immature Granulocytes: 0 %
Lymphocytes Relative: 19 %
Lymphs Abs: 0.5 10*3/uL — ABNORMAL LOW (ref 0.7–4.0)
MCH: 29.9 pg (ref 26.0–34.0)
MCHC: 33.5 g/dL (ref 30.0–36.0)
MCV: 89.4 fL (ref 80.0–100.0)
Monocytes Absolute: 0.4 10*3/uL (ref 0.1–1.0)
Monocytes Relative: 14 %
Neutro Abs: 1.8 10*3/uL (ref 1.7–7.7)
Neutrophils Relative %: 65 %
Platelets: 183 10*3/uL (ref 150–400)
RBC: 2.54 MIL/uL — ABNORMAL LOW (ref 3.87–5.11)
RDW: 12.7 % (ref 11.5–15.5)
WBC: 2.8 10*3/uL — ABNORMAL LOW (ref 4.0–10.5)
nRBC: 0 % (ref 0.0–0.2)

## 2019-12-19 LAB — COMPREHENSIVE METABOLIC PANEL
ALT: 9 U/L (ref 0–44)
AST: 13 U/L — ABNORMAL LOW (ref 15–41)
Albumin: 3.4 g/dL — ABNORMAL LOW (ref 3.5–5.0)
Alkaline Phosphatase: 80 U/L (ref 38–126)
Anion gap: 10 (ref 5–15)
BUN: 52 mg/dL — ABNORMAL HIGH (ref 6–20)
CO2: 28 mmol/L (ref 22–32)
Calcium: 8.3 mg/dL — ABNORMAL LOW (ref 8.9–10.3)
Chloride: 103 mmol/L (ref 98–111)
Creatinine, Ser: 2.35 mg/dL — ABNORMAL HIGH (ref 0.44–1.00)
GFR calc Af Amer: 26 mL/min — ABNORMAL LOW (ref >60)
GFR calc non Af Amer: 23 mL/min — ABNORMAL LOW (ref >60)
Glucose, Bld: 81 mg/dL (ref 70–99)
Potassium: 3.8 mmol/L (ref 3.5–5.1)
Sodium: 141 mmol/L (ref 135–145)
Total Bilirubin: 0.2 mg/dL — ABNORMAL LOW (ref 0.3–1.2)
Total Protein: 7.1 g/dL (ref 6.5–8.1)

## 2019-12-19 MED ORDER — FULVESTRANT 250 MG/5ML IM SOLN
500.0000 mg | Freq: Once | INTRAMUSCULAR | Status: AC
  Administered 2019-12-19: 500 mg via INTRAMUSCULAR

## 2019-12-19 MED ORDER — FULVESTRANT 250 MG/5ML IM SOLN
INTRAMUSCULAR | Status: AC
  Filled 2019-12-19: qty 5

## 2019-12-19 NOTE — Patient Instructions (Signed)
Fulvestrant injection What is this medicine? FULVESTRANT (ful VES trant) blocks the effects of estrogen. It is used to treat breast cancer. This medicine may be used for other purposes; ask your health care provider or pharmacist if you have questions. COMMON BRAND NAME(S): FASLODEX What should I tell my health care provider before I take this medicine? They need to know if you have any of these conditions:  bleeding disorders  liver disease  low blood counts, like low white cell, platelet, or red cell counts  an unusual or allergic reaction to fulvestrant, other medicines, foods, dyes, or preservatives  pregnant or trying to get pregnant  breast-feeding How should I use this medicine? This medicine is for injection into a muscle. It is usually given by a health care professional in a hospital or clinic setting. Talk to your pediatrician regarding the use of this medicine in children. Special care may be needed. Overdosage: If you think you have taken too much of this medicine contact a poison control center or emergency room at once. NOTE: This medicine is only for you. Do not share this medicine with others. What if I miss a dose? It is important not to miss your dose. Call your doctor or health care professional if you are unable to keep an appointment. What may interact with this medicine?  medicines that treat or prevent blood clots like warfarin, enoxaparin, dalteparin, apixaban, dabigatran, and rivaroxaban This list may not describe all possible interactions. Give your health care provider a list of all the medicines, herbs, non-prescription drugs, or dietary supplements you use. Also tell them if you smoke, drink alcohol, or use illegal drugs. Some items may interact with your medicine. What should I watch for while using this medicine? Your condition will be monitored carefully while you are receiving this medicine. You will need important blood work done while you are taking  this medicine. Do not become pregnant while taking this medicine or for at least 1 year after stopping it. Women of child-bearing potential will need to have a negative pregnancy test before starting this medicine. Women should inform their doctor if they wish to become pregnant or think they might be pregnant. There is a potential for serious side effects to an unborn child. Men should inform their doctors if they wish to father a child. This medicine may lower sperm counts. Talk to your health care professional or pharmacist for more information. Do not breast-feed an infant while taking this medicine or for 1 year after the last dose. What side effects may I notice from receiving this medicine? Side effects that you should report to your doctor or health care professional as soon as possible:  allergic reactions like skin rash, itching or hives, swelling of the face, lips, or tongue  feeling faint or lightheaded, falls  pain, tingling, numbness, or weakness in the legs  signs and symptoms of infection like fever or chills; cough; flu-like symptoms; sore throat  vaginal bleeding Side effects that usually do not require medical attention (report to your doctor or health care professional if they continue or are bothersome):  aches, pains  constipation  diarrhea  headache  hot flashes  nausea, vomiting  pain at site where injected  stomach pain This list may not describe all possible side effects. Call your doctor for medical advice about side effects. You may report side effects to FDA at 1-800-FDA-1088. Where should I keep my medicine? This drug is given in a hospital or clinic and will   not be stored at home. NOTE: This sheet is a summary. It may not cover all possible information. If you have questions about this medicine, talk to your doctor, pharmacist, or health care provider.  2020 Elsevier/Gold Standard (2018-01-13 11:34:41)  

## 2019-12-20 ENCOUNTER — Telehealth: Payer: Self-pay | Admitting: Oncology

## 2019-12-20 LAB — CANCER ANTIGEN 27.29: CA 27.29: 43.5 U/mL — ABNORMAL HIGH (ref 0.0–38.6)

## 2019-12-20 NOTE — Telephone Encounter (Signed)
I left a message regarding new start time for 3/30

## 2019-12-25 ENCOUNTER — Other Ambulatory Visit: Payer: Self-pay

## 2019-12-25 ENCOUNTER — Encounter: Payer: Self-pay | Admitting: Family

## 2019-12-25 ENCOUNTER — Ambulatory Visit: Payer: 59 | Attending: Family | Admitting: Family

## 2019-12-25 VITALS — BP 116/79 | HR 86 | Resp 18 | Ht 59.0 in | Wt 156.8 lb

## 2019-12-25 DIAGNOSIS — N189 Chronic kidney disease, unspecified: Secondary | ICD-10-CM | POA: Diagnosis not present

## 2019-12-25 DIAGNOSIS — Z833 Family history of diabetes mellitus: Secondary | ICD-10-CM | POA: Insufficient documentation

## 2019-12-25 DIAGNOSIS — Z79899 Other long term (current) drug therapy: Secondary | ICD-10-CM | POA: Diagnosis not present

## 2019-12-25 DIAGNOSIS — Z853 Personal history of malignant neoplasm of breast: Secondary | ICD-10-CM | POA: Diagnosis not present

## 2019-12-25 DIAGNOSIS — E559 Vitamin D deficiency, unspecified: Secondary | ICD-10-CM | POA: Diagnosis not present

## 2019-12-25 DIAGNOSIS — Z923 Personal history of irradiation: Secondary | ICD-10-CM | POA: Diagnosis not present

## 2019-12-25 DIAGNOSIS — Z6831 Body mass index (BMI) 31.0-31.9, adult: Secondary | ICD-10-CM | POA: Insufficient documentation

## 2019-12-25 DIAGNOSIS — I509 Heart failure, unspecified: Secondary | ICD-10-CM | POA: Diagnosis present

## 2019-12-25 DIAGNOSIS — Z803 Family history of malignant neoplasm of breast: Secondary | ICD-10-CM | POA: Diagnosis not present

## 2019-12-25 DIAGNOSIS — Z9221 Personal history of antineoplastic chemotherapy: Secondary | ICD-10-CM | POA: Diagnosis not present

## 2019-12-25 DIAGNOSIS — I5022 Chronic systolic (congestive) heart failure: Secondary | ICD-10-CM | POA: Diagnosis not present

## 2019-12-25 DIAGNOSIS — I130 Hypertensive heart and chronic kidney disease with heart failure and stage 1 through stage 4 chronic kidney disease, or unspecified chronic kidney disease: Secondary | ICD-10-CM | POA: Insufficient documentation

## 2019-12-25 DIAGNOSIS — I10 Essential (primary) hypertension: Secondary | ICD-10-CM

## 2019-12-25 DIAGNOSIS — E669 Obesity, unspecified: Secondary | ICD-10-CM | POA: Diagnosis not present

## 2019-12-25 DIAGNOSIS — Z7983 Long term (current) use of bisphosphonates: Secondary | ICD-10-CM | POA: Diagnosis not present

## 2019-12-25 DIAGNOSIS — M3214 Glomerular disease in systemic lupus erythematosus: Secondary | ICD-10-CM | POA: Diagnosis not present

## 2019-12-25 NOTE — Patient Instructions (Addendum)
Continue weighing daily and call for an overnight weight gain of > 2 pounds or a weekly weight gain of >5 pounds.   Call us in the future if you'd like to schedule another appointment 
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
## 2019-12-26 ENCOUNTER — Ambulatory Visit: Payer: 59 | Admitting: Family

## 2020-01-11 ENCOUNTER — Other Ambulatory Visit: Payer: Self-pay | Admitting: Oncology

## 2020-01-11 ENCOUNTER — Telehealth: Payer: Self-pay | Admitting: *Deleted

## 2020-01-11 DIAGNOSIS — M320 Drug-induced systemic lupus erythematosus: Secondary | ICD-10-CM

## 2020-01-11 MED ORDER — METOCLOPRAMIDE HCL 5 MG PO TABS: 5.0000 mg | ORAL_TABLET | Freq: Every day | ORAL | 1 refills | Status: DC

## 2020-01-11 NOTE — Telephone Encounter (Signed)
This RN spoke with pt per her call today stating she went to her kidney doctor who has decreased her Melanie Holloway - thinking that it may be interfering with the verzenio and increasing side effects of diarrhea.  Melanie Holloway states she has increased liquidy stools- at least 3 in the AM and then again in the PM.  She does not state that the diarrhea is ongoing all day - primarily at the above times.  She states she vomits " like about 1 hour after taking the verzenio at night "  She states some of the time the " pills come back ".  Per review with MD- plan is for pt take reglan 1 hour prior to verzenio.  Melanie Holloway will institute the above and call if needed prior to her scheduled appointment on 3/30.
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
## 2020-01-16 ENCOUNTER — Other Ambulatory Visit: Payer: No Typology Code available for payment source

## 2020-01-16 ENCOUNTER — Other Ambulatory Visit: Payer: Self-pay

## 2020-01-16 ENCOUNTER — Encounter: Payer: Self-pay | Admitting: Emergency Medicine

## 2020-01-16 ENCOUNTER — Telehealth: Payer: Self-pay | Admitting: *Deleted

## 2020-01-16 ENCOUNTER — Inpatient Hospital Stay (HOSPITAL_BASED_OUTPATIENT_CLINIC_OR_DEPARTMENT_OTHER): Payer: No Typology Code available for payment source | Admitting: Oncology

## 2020-01-16 ENCOUNTER — Inpatient Hospital Stay
Admission: EM | Admit: 2020-01-16 | Discharge: 2020-01-18 | DRG: 682 | Disposition: A | Discharge status: Home / Self Care | Payer: No Typology Code available for payment source | Attending: Family Medicine | Admitting: Family Medicine

## 2020-01-16 ENCOUNTER — Inpatient Hospital Stay: Payer: No Typology Code available for payment source

## 2020-01-16 ENCOUNTER — Ambulatory Visit: Payer: No Typology Code available for payment source

## 2020-01-16 ENCOUNTER — Emergency Department: Payer: No Typology Code available for payment source

## 2020-01-16 ENCOUNTER — Telehealth: Payer: Self-pay | Admitting: Oncology

## 2020-01-16 VITALS — BP 110/71 | HR 78 | Temp 99.1°F | Resp 18 | Ht 59.0 in | Wt 149.2 lb

## 2020-01-16 DIAGNOSIS — Z170 Estrogen receptor positive status [ER+]: Secondary | ICD-10-CM

## 2020-01-16 DIAGNOSIS — Z6832 Body mass index (BMI) 32.0-32.9, adult: Secondary | ICD-10-CM

## 2020-01-16 DIAGNOSIS — R112 Nausea with vomiting, unspecified: Secondary | ICD-10-CM

## 2020-01-16 DIAGNOSIS — C7951 Secondary malignant neoplasm of bone: Secondary | ICD-10-CM

## 2020-01-16 DIAGNOSIS — Z20822 Contact with and (suspected) exposure to covid-19: Secondary | ICD-10-CM | POA: Diagnosis present

## 2020-01-16 DIAGNOSIS — D561 Beta thalassemia: Secondary | ICD-10-CM

## 2020-01-16 DIAGNOSIS — M3214 Glomerular disease in systemic lupus erythematosus: Secondary | ICD-10-CM

## 2020-01-16 DIAGNOSIS — C50512 Malignant neoplasm of lower-outer quadrant of left female breast: Secondary | ICD-10-CM

## 2020-01-16 DIAGNOSIS — D638 Anemia in other chronic diseases classified elsewhere: Secondary | ICD-10-CM | POA: Diagnosis present

## 2020-01-16 DIAGNOSIS — N179 Acute kidney failure, unspecified: Principal | ICD-10-CM

## 2020-01-16 DIAGNOSIS — Z9012 Acquired absence of left breast and nipple: Secondary | ICD-10-CM

## 2020-01-16 DIAGNOSIS — I130 Hypertensive heart and chronic kidney disease with heart failure and stage 1 through stage 4 chronic kidney disease, or unspecified chronic kidney disease: Secondary | ICD-10-CM | POA: Diagnosis present

## 2020-01-16 DIAGNOSIS — Z79899 Other long term (current) drug therapy: Secondary | ICD-10-CM | POA: Diagnosis not present

## 2020-01-16 DIAGNOSIS — D61818 Other pancytopenia: Secondary | ICD-10-CM

## 2020-01-16 DIAGNOSIS — N189 Chronic kidney disease, unspecified: Secondary | ICD-10-CM

## 2020-01-16 DIAGNOSIS — R197 Diarrhea, unspecified: Secondary | ICD-10-CM | POA: Diagnosis present

## 2020-01-16 DIAGNOSIS — T451X5A Adverse effect of antineoplastic and immunosuppressive drugs, initial encounter: Secondary | ICD-10-CM | POA: Diagnosis present

## 2020-01-16 DIAGNOSIS — N390 Urinary tract infection, site not specified: Secondary | ICD-10-CM | POA: Diagnosis present

## 2020-01-16 DIAGNOSIS — C771 Secondary and unspecified malignant neoplasm of intrathoracic lymph nodes: Secondary | ICD-10-CM

## 2020-01-16 DIAGNOSIS — L89322 Pressure ulcer of left buttock, stage 2: Secondary | ICD-10-CM | POA: Diagnosis present

## 2020-01-16 DIAGNOSIS — M328 Other forms of systemic lupus erythematosus: Secondary | ICD-10-CM | POA: Diagnosis not present

## 2020-01-16 DIAGNOSIS — C50912 Malignant neoplasm of unspecified site of left female breast: Secondary | ICD-10-CM

## 2020-01-16 DIAGNOSIS — E669 Obesity, unspecified: Secondary | ICD-10-CM | POA: Diagnosis present

## 2020-01-16 DIAGNOSIS — D61810 Antineoplastic chemotherapy induced pancytopenia: Secondary | ICD-10-CM | POA: Diagnosis present

## 2020-01-16 DIAGNOSIS — M329 Systemic lupus erythematosus, unspecified: Secondary | ICD-10-CM

## 2020-01-16 DIAGNOSIS — Z923 Personal history of irradiation: Secondary | ICD-10-CM

## 2020-01-16 DIAGNOSIS — I351 Nonrheumatic aortic (valve) insufficiency: Secondary | ICD-10-CM | POA: Diagnosis not present

## 2020-01-16 DIAGNOSIS — Z853 Personal history of malignant neoplasm of breast: Secondary | ICD-10-CM

## 2020-01-16 DIAGNOSIS — E860 Dehydration: Secondary | ICD-10-CM | POA: Diagnosis present

## 2020-01-16 DIAGNOSIS — Z7189 Other specified counseling: Secondary | ICD-10-CM

## 2020-01-16 DIAGNOSIS — N1832 Chronic kidney disease, stage 3b: Secondary | ICD-10-CM | POA: Diagnosis present

## 2020-01-16 DIAGNOSIS — C50919 Malignant neoplasm of unspecified site of unspecified female breast: Secondary | ICD-10-CM

## 2020-01-16 DIAGNOSIS — N184 Chronic kidney disease, stage 4 (severe): Secondary | ICD-10-CM

## 2020-01-16 DIAGNOSIS — I5022 Chronic systolic (congestive) heart failure: Secondary | ICD-10-CM | POA: Diagnosis present

## 2020-01-16 DIAGNOSIS — M320 Drug-induced systemic lupus erythematosus: Secondary | ICD-10-CM

## 2020-01-16 LAB — COMPREHENSIVE METABOLIC PANEL
ALT: 9 U/L (ref 0–44)
AST: 14 U/L — ABNORMAL LOW (ref 15–41)
Albumin: 3.8 g/dL (ref 3.5–5.0)
Alkaline Phosphatase: 87 U/L (ref 38–126)
Anion gap: 15 (ref 5–15)
BUN: 90 mg/dL — ABNORMAL HIGH (ref 6–20)
CO2: 17 mmol/L — ABNORMAL LOW (ref 22–32)
Calcium: 9.8 mg/dL (ref 8.9–10.3)
Chloride: 103 mmol/L (ref 98–111)
Creatinine, Ser: 5.76 mg/dL (ref 0.44–1.00)
GFR calc Af Amer: 9 mL/min — ABNORMAL LOW (ref >60)
GFR calc non Af Amer: 8 mL/min — ABNORMAL LOW (ref >60)
Glucose, Bld: 92 mg/dL (ref 70–99)
Potassium: 3.9 mmol/L (ref 3.5–5.1)
Sodium: 135 mmol/L (ref 135–145)
Total Bilirubin: 0.3 mg/dL (ref 0.3–1.2)
Total Protein: 7.7 g/dL (ref 6.5–8.1)

## 2020-01-16 LAB — URINALYSIS, COMPLETE (UACMP) WITH MICROSCOPIC
Bilirubin Urine: NEGATIVE
Glucose, UA: NEGATIVE mg/dL
Hgb urine dipstick: NEGATIVE
Ketones, ur: NEGATIVE mg/dL
Nitrite: NEGATIVE
Protein, ur: NEGATIVE mg/dL
Specific Gravity, Urine: 1.01 (ref 1.005–1.030)
pH: 5 (ref 5.0–8.0)

## 2020-01-16 LAB — CBC WITH DIFFERENTIAL/PLATELET
Abs Immature Granulocytes: 0.01 10*3/uL (ref 0.00–0.07)
Basophils Absolute: 0 10*3/uL (ref 0.0–0.1)
Basophils Relative: 1 %
Eosinophils Absolute: 0 10*3/uL (ref 0.0–0.5)
Eosinophils Relative: 1 %
HCT: 20.6 % — ABNORMAL LOW (ref 36.0–46.0)
Hemoglobin: 7.1 g/dL — ABNORMAL LOW (ref 12.0–15.0)
Immature Granulocytes: 1 %
Lymphocytes Relative: 31 %
Lymphs Abs: 0.7 10*3/uL (ref 0.7–4.0)
MCH: 29.3 pg (ref 26.0–34.0)
MCHC: 34.5 g/dL (ref 30.0–36.0)
MCV: 85.1 fL (ref 80.0–100.0)
Monocytes Absolute: 0.2 10*3/uL (ref 0.1–1.0)
Monocytes Relative: 10 %
Neutro Abs: 1.2 10*3/uL — ABNORMAL LOW (ref 1.7–7.7)
Neutrophils Relative %: 56 %
Platelets: 86 10*3/uL — ABNORMAL LOW (ref 150–400)
RBC: 2.42 MIL/uL — ABNORMAL LOW (ref 3.87–5.11)
RDW: 12.6 % (ref 11.5–15.5)
WBC: 2.1 10*3/uL — ABNORMAL LOW (ref 4.0–10.5)
nRBC: 0 % (ref 0.0–0.2)

## 2020-01-16 LAB — PHOSPHORUS: Phosphorus: 4.6 mg/dL (ref 2.5–4.6)

## 2020-01-16 MED ORDER — CLOBETASOL PROPIONATE 0.05 % EX CREA
1.0000 "application " | TOPICAL_CREAM | Freq: Two times a day (BID) | CUTANEOUS | Status: DC
  Administered 2020-01-18: 1 via TOPICAL
  Filled 2020-01-16: qty 15

## 2020-01-16 MED ORDER — HEPARIN SODIUM (PORCINE) 5000 UNIT/ML IJ SOLN: 5000.0000 [IU] | Freq: Three times a day (TID) | INTRAMUSCULAR | Status: DC

## 2020-01-16 MED ORDER — METOPROLOL SUCCINATE ER 50 MG PO TB24
50.0000 mg | ORAL_TABLET | Freq: Every day | ORAL | Status: DC
  Administered 2020-01-17 – 2020-01-18 (×2): 50 mg via ORAL
  Filled 2020-01-16 (×2): qty 1

## 2020-01-16 MED ORDER — POTASSIUM CHLORIDE CRYS ER 10 MEQ PO TBCR
10.0000 meq | EXTENDED_RELEASE_TABLET | Freq: Every day | ORAL | Status: DC
  Administered 2020-01-17: 20:00:00 10 meq via ORAL
  Filled 2020-01-16 (×2): qty 1

## 2020-01-16 MED ORDER — VITAMIN D 25 MCG (1000 UNIT) PO TABS
1000.0000 [IU] | ORAL_TABLET | Freq: Every day | ORAL | Status: DC
  Administered 2020-01-17 – 2020-01-18 (×2): 1000 [IU] via ORAL
  Filled 2020-01-16 (×2): qty 1

## 2020-01-16 MED ORDER — ACETAMINOPHEN 325 MG PO TABS: 650.0000 mg | ORAL_TABLET | Freq: Four times a day (QID) | ORAL | Status: DC | PRN

## 2020-01-16 MED ORDER — MYCOPHENOLATE MOFETIL 250 MG PO CAPS
500.0000 mg | ORAL_CAPSULE | Freq: Two times a day (BID) | ORAL | Status: DC
  Administered 2020-01-17 – 2020-01-18 (×4): 500 mg via ORAL
  Filled 2020-01-16 (×5): qty 2

## 2020-01-16 MED ORDER — CHOLESTYRAMINE 4 G PO PACK
4.0000 g | PACK | Freq: Every day | ORAL | Status: DC
  Filled 2020-01-16 (×2): qty 1

## 2020-01-16 MED ORDER — FULVESTRANT 250 MG/5ML IM SOLN
INTRAMUSCULAR | Status: AC
  Filled 2020-01-16: qty 10

## 2020-01-16 MED ORDER — ONDANSETRON HCL 4 MG PO TABS: 4.0000 mg | ORAL_TABLET | Freq: Four times a day (QID) | ORAL | Status: DC | PRN

## 2020-01-16 MED ORDER — SODIUM CHLORIDE 0.9 % IV SOLN: Freq: Once | INTRAVENOUS | Status: DC

## 2020-01-16 MED ORDER — SODIUM CHLORIDE 0.9 % IV SOLN
INTRAVENOUS | Status: DC
  Administered 2020-01-16: 1000 mL via INTRAVENOUS

## 2020-01-16 MED ORDER — SODIUM CHLORIDE 0.9 % IV BOLUS
500.0000 mL | Freq: Once | INTRAVENOUS | Status: AC
  Administered 2020-01-16: 500 mL via INTRAVENOUS

## 2020-01-16 MED ORDER — HYDROXYCHLOROQUINE SULFATE 200 MG PO TABS
200.0000 mg | ORAL_TABLET | Freq: Every day | ORAL | Status: DC
  Administered 2020-01-17 – 2020-01-18 (×2): 200 mg via ORAL
  Filled 2020-01-16 (×2): qty 1

## 2020-01-16 MED ORDER — FULVESTRANT 250 MG/5ML IM SOLN
500.0000 mg | Freq: Once | INTRAMUSCULAR | Status: AC
  Administered 2020-01-16: 500 mg via INTRAMUSCULAR

## 2020-01-16 MED ORDER — ACETAMINOPHEN 650 MG RE SUPP: 650.0000 mg | Freq: Four times a day (QID) | RECTAL | Status: DC | PRN

## 2020-01-16 MED ORDER — ONDANSETRON HCL 4 MG/2ML IJ SOLN: 4.0000 mg | Freq: Four times a day (QID) | INTRAMUSCULAR | Status: DC | PRN

## 2020-01-16 NOTE — ED Notes (Signed)
This RN in to answer call light. Patient reports she is now willing to have covid test done and stay in hospital overnight. MD made aware.

## 2020-01-16 NOTE — Progress Notes (Signed)
Document in error, wrong patient. Melanie Holloway

## 2020-01-16 NOTE — Telephone Encounter (Signed)
This RN called to [facility] per MD request to alert them of noted elevation in creatinine per lab draw today.  This RN spoke with York Cerise - informed her of above and per visit today pt was advised to go directly to their office post completion of visit today.  Desiree stated Dr Juleen China is out of the office this week but will inform Dr Candiss Norse of results.  This RN's name and direct call back number given for further communication.  Per call back - Desiree stated recommendation per Dr Candiss Norse is to go the ER at [facility] for further evaluation.  This RN informed pt of above with verbalized understanding.  This RN contacted [facility] ER and informed Triage nurse of pt's pending arrival per request of Dr Candiss Norse.

## 2020-01-16 NOTE — ED Notes (Signed)
ED Provider at bedside. 

## 2020-01-16 NOTE — Telephone Encounter (Signed)
Scheduled appt per 3/30 los. Pt confirmed appt date and time. Pt to get print out of calendar at next visit.

## 2020-01-16 NOTE — Progress Notes (Signed)
PRE HD   

## 2020-01-16 NOTE — Progress Notes (Signed)
HD started. 

## 2020-01-16 NOTE — ED Notes (Signed)
Pt to the er for elevated labs. Pt states she has had vomiting and diarrhea for the last month due to a cancer med. They stopped the medicine today however her kidney function has decreased. Nephrology wants pt admitted. Pt reports abd cramping with the episodes of diarrhea. Pt stable at this time.

## 2020-01-16 NOTE — ED Notes (Signed)
Pt back from [place]. Pt still has not decided if she wants to stay and be admitted. Will allow pt to continue to consider her situation.

## 2020-01-16 NOTE — Progress Notes (Signed)
Documented on wrong patient in error.  Melanie Holloway

## 2020-01-16 NOTE — Patient Instructions (Signed)
Fulvestrant injection What is this medicine? FULVESTRANT (ful VES trant) blocks the effects of estrogen. It is used to treat breast cancer. This medicine may be used for other purposes; ask your health care provider or pharmacist if you have questions. COMMON BRAND NAME(S): FASLODEX What should I tell my health care provider before I take this medicine? They need to know if you have any of these conditions:  bleeding disorders  liver disease  low blood counts, like low white cell, platelet, or red cell counts  an unusual or allergic reaction to fulvestrant, other medicines, foods, dyes, or preservatives  pregnant or trying to get pregnant  breast-feeding How should I use this medicine? This medicine is for injection into a muscle. It is usually given by a health care professional in a hospital or clinic setting. Talk to your pediatrician regarding the use of this medicine in children. Special care may be needed. Overdosage: If you think you have taken too much of this medicine contact a poison control center or emergency room at once. NOTE: This medicine is only for you. Do not share this medicine with others. What if I miss a dose? It is important not to miss your dose. Call your doctor or health care professional if you are unable to keep an appointment. What may interact with this medicine?  medicines that treat or prevent blood clots like warfarin, enoxaparin, dalteparin, apixaban, dabigatran, and rivaroxaban This list may not describe all possible interactions. Give your health care provider a list of all the medicines, herbs, non-prescription drugs, or dietary supplements you use. Also tell them if you smoke, drink alcohol, or use illegal drugs. Some items may interact with your medicine. What should I watch for while using this medicine? Your condition will be monitored carefully while you are receiving this medicine. You will need important blood work done while you are taking  this medicine. Do not become pregnant while taking this medicine or for at least 1 year after stopping it. Women of child-bearing potential will need to have a negative pregnancy test before starting this medicine. Women should inform their doctor if they wish to become pregnant or think they might be pregnant. There is a potential for serious side effects to an unborn child. Men should inform their doctors if they wish to father a child. This medicine may lower sperm counts. Talk to your health care professional or pharmacist for more information. Do not breast-feed an infant while taking this medicine or for 1 year after the last dose. What side effects may I notice from receiving this medicine? Side effects that you should report to your doctor or health care professional as soon as possible:  allergic reactions like skin rash, itching or hives, swelling of the face, lips, or tongue  feeling faint or lightheaded, falls  pain, tingling, numbness, or weakness in the legs  signs and symptoms of infection like fever or chills; cough; flu-like symptoms; sore throat  vaginal bleeding Side effects that usually do not require medical attention (report to your doctor or health care professional if they continue or are bothersome):  aches, pains  constipation  diarrhea  headache  hot flashes  nausea, vomiting  pain at site where injected  stomach pain This list may not describe all possible side effects. Call your doctor for medical advice about side effects. You may report side effects to FDA at 1-800-FDA-1088. Where should I keep my medicine? This drug is given in a hospital or clinic and will   not be stored at home. NOTE: This sheet is a summary. It may not cover all possible information. If you have questions about this medicine, talk to your doctor, pharmacist, or health care provider.  2020 Elsevier/Gold Standard (2018-01-13 11:34:41)  

## 2020-01-16 NOTE — ED Notes (Signed)
Pt expresses unhappiness on time to receive a bed assignment. PT educated on process by this nurse and reports understanding but is still unhappy with time taken. Will continue to monitor.
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
## 2020-01-16 NOTE — ED Triage Notes (Signed)
Pt was at [facility] center for routine appointment. Sent over for AKI.  All labs just done and available in epic, labs not repeated. Pt is chemo pt. Ambulatory. VSS. No pain at this time.
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
## 2020-01-16 NOTE — ED Notes (Signed)
Attempt to call report unsucessful

## 2020-01-16 NOTE — ED Notes (Signed)
Pt given a [place] tray for dinner and juice to drink. Pt given call bell. Fluids not infusing on pump. Fluids free hanging to infuse. VSS

## 2020-01-17 LAB — BASIC METABOLIC PANEL
Anion gap: 11 (ref 5–15)
BUN: 98 mg/dL — ABNORMAL HIGH (ref 6–20)
CO2: 19 mmol/L — ABNORMAL LOW (ref 22–32)
Calcium: 8.9 mg/dL (ref 8.9–10.3)
Chloride: 107 mmol/L (ref 98–111)
Creatinine, Ser: 4.44 mg/dL — ABNORMAL HIGH (ref 0.44–1.00)
GFR calc Af Amer: 12 mL/min — ABNORMAL LOW (ref >60)
GFR calc non Af Amer: 10 mL/min — ABNORMAL LOW (ref >60)
Glucose, Bld: 91 mg/dL (ref 70–99)
Potassium: 3.4 mmol/L — ABNORMAL LOW (ref 3.5–5.1)
Sodium: 137 mmol/L (ref 135–145)

## 2020-01-17 LAB — CBC
HCT: 19.9 % — ABNORMAL LOW (ref 36.0–46.0)
Hemoglobin: 6.9 g/dL — ABNORMAL LOW (ref 12.0–15.0)
MCH: 29.1 pg (ref 26.0–34.0)
MCHC: 34.7 g/dL (ref 30.0–36.0)
MCV: 84 fL (ref 80.0–100.0)
Platelets: 79 10*3/uL — ABNORMAL LOW (ref 150–400)
RBC: 2.37 MIL/uL — ABNORMAL LOW (ref 3.87–5.11)
RDW: 12.7 % (ref 11.5–15.5)
WBC: 1.6 10*3/uL — ABNORMAL LOW (ref 4.0–10.5)
nRBC: 0 % (ref 0.0–0.2)

## 2020-01-17 LAB — PREPARE RBC (CROSSMATCH)

## 2020-01-17 LAB — PTH, INTACT AND CALCIUM
Calcium, Total (PTH): 9.8 mg/dL (ref 8.7–10.2)
PTH: 47 pg/mL (ref 15–65)

## 2020-01-17 LAB — SARS CORONAVIRUS 2 (TAT 6-24 HRS): SARS Coronavirus 2: NEGATIVE

## 2020-01-17 LAB — HIV ANTIBODY (ROUTINE TESTING W REFLEX): HIV Screen 4th Generation wRfx: NONREACTIVE

## 2020-01-17 LAB — HEMOGLOBIN AND HEMATOCRIT, BLOOD
HCT: 23.6 % — ABNORMAL LOW (ref 36.0–46.0)
Hemoglobin: 8.1 g/dL — ABNORMAL LOW (ref 12.0–15.0)

## 2020-01-17 LAB — C3 AND C4
C3 Complement: 131 mg/dL (ref 82–167)
Complement C4, Body Fluid: 32 mg/dL (ref 12–38)

## 2020-01-17 LAB — CANCER ANTIGEN 27.29: CA 27.29: 48.8 U/mL — ABNORMAL HIGH (ref 0.0–38.6)

## 2020-01-17 MED ORDER — SODIUM CHLORIDE 0.9% IV SOLUTION: Freq: Once | INTRAVENOUS | Status: DC

## 2020-01-17 MED ORDER — HEPARIN SODIUM (PORCINE) 5000 UNIT/ML IJ SOLN
5000.0000 [IU] | Freq: Three times a day (TID) | INTRAMUSCULAR | Status: DC
  Administered 2020-01-17 – 2020-01-18 (×2): 5000 [IU] via SUBCUTANEOUS
  Filled 2020-01-17 (×2): qty 1

## 2020-01-17 MED ORDER — LETROZOLE 2.5 MG PO TABS
2.5000 mg | ORAL_TABLET | Freq: Every day | ORAL | Status: DC
  Administered 2020-01-17 – 2020-01-18 (×2): 2.5 mg via ORAL
  Filled 2020-01-17 (×2): qty 1

## 2020-01-17 MED ORDER — SODIUM CHLORIDE 0.9 % IV SOLN
1.0000 g | INTRAVENOUS | Status: DC
  Administered 2020-01-17 – 2020-01-18 (×2): 1 g via INTRAVENOUS
  Filled 2020-01-17 (×2): qty 1

## 2020-01-17 MED ORDER — ALUM & MAG HYDROXIDE-SIMETH 200-200-20 MG/5ML PO SUSP
30.0000 mL | ORAL | Status: DC | PRN
  Administered 2020-01-17: 22:00:00 30 mL via ORAL
  Filled 2020-01-17: qty 30

## 2020-01-17 NOTE — Progress Notes (Signed)
PROGRESS NOTE  Melanie Holloway  ZSW:[id number] DOB: [birth year] DOA: 01/16/2020 PCP: Ria Bush, MD   Brief Narrative: Melanie Holloway is a 54 y.o. female with a history of lupus nephritis, stage IIIb CKD, AOCD, breast CA on chemotherapy s/p XRT, chronic HFrEF (LVEF 25-30%) who presented to the ED on the advice of her physician due to worsened renal function in the setting of severe vomiting and diarrhea. Creatinine was 5.76 (previous baseline 1.74). IV fluids were started and patient admitted.   Assessment & Plan: Active Problems:   Anemia of chronic disease   Malignant neoplasm of female breast (HCC)   Pancytopenia (HCC)   Lupus (HCC)   Chronic systolic CHF (congestive heart failure) ([place])   Acute kidney injury superimposed on CKD ([place])  AKI on stage IIIb CKD: Cr down to 4.4. U/S with mildly increased echotexture without significant hydronephrosis. Bland sediment and normal complement levels argue against active lupus. - Continue holding diuretics/entresto. Will stop IVF now that giving RBCs.  - Monitor in AM.  Dehydration due to vomiting and diarrhea due to chemotherapy: Improved dramatically since holding medication.  - Taking adequate po now. prn antiemetics. Do not feel stool testing is necessary.  AOCD:  - 1u PRBCs today 3/31 (was scheduled for this as outpatient 4/2).   UTI: Many bacteria, 21-50WBCs/HPF on clean catch. In setting of immunosuppression.  - Continue ceftriaxone  Chronic HFrEF (EF 25-30%): Not currently with evidence of overload.  - Restart home medications when able. Continue BB.  Lupus with nephritis:  - Continue cellcept, plaquenil.   Pancytopenia: Due to chemotherapy.  - Monitor.  Breast CA:  - Continue with oncology follow up.  - Restart letrozola  RN Pressure Injury Documentation: Pressure Injury 03/04/18 Stage II -  Partial thickness loss of dermis presenting as a shallow open ulcer with a red, pink wound bed without slough. (Active)   03/04/18 0606  Location: Buttocks  Location Orientation: Left  Staging: Stage II -  Partial thickness loss of dermis presenting as a shallow open ulcer with a red, pink wound bed without slough.  Wound Description (Comments):   Present on Admission: Yes    Obesity: Estimated body mass index is 32.15 kg/m as calculated from the following:   Height as of this encounter: 4\' 9"  (1.448 m).   Weight as of this encounter: 67.4 kg.  DVT prophylaxis: Heparin [place], at high risk with malignancy, letrozole. Code Status: Full Family Communication: Daughter at bedside Disposition Plan: Patient likely to return home 4/1 if renal function improving.    Consultants:   Nephrology  Procedures:   None  Antimicrobials:  Ceftriaxone   Subjective: Nausea, vomiting, diarrhea, abruptly stopped once no longer taking chemotherapy. No fevers. Eating and drinking well.   Objective: Vitals:   01/16/20 2330 01/17/20 0200 01/17/20 0400 01/17/20 0809  BP: 114/75   117/71  Pulse: 74 74 70 83  Resp: 12 13 15 12   Temp: 98.3 F (36.8 C)   (!) 97.5 F (36.4 C)  TempSrc: Oral   Oral  SpO2: 100% 100% 100% 100%  Weight:      Height:        Intake/Output Summary (Last 24 hours) at 01/17/2020 1500 Last data filed at 01/17/2020 0300 Gross per 24 hour  Intake 95.59 ml  Output -  Net 95.59 ml   Filed Weights   01/16/20 1248 01/16/20 2030 01/16/20 2300  Weight: 67.7 kg 67.7 kg 67.4 kg    Gen: 54 y.o. female in no  distress  Pulm: Non-labored breathing. Clear to auscultation bilaterally.  CV: Regular rate and rhythm. No murmur, rub, or gallop. No JVD, no pedal edema. GI: Abdomen soft, non-tender, non-distended, with normoactive bowel sounds. No organomegaly or masses felt. Ext: Warm, no deformities Skin: No rashes, lesions no ulcers Neuro: Alert and oriented. No focal neurological deficits. Psych: Judgement and insight appear normal. Mood & affect appropriate.   Data Reviewed: I have personally  reviewed following labs and imaging studies  CBC: Recent Labs  Lab 01/16/20 1008 01/17/20 0406  WBC 2.1* 1.6*  NEUTROABS 1.2*  --   HGB 7.1* 6.9*  HCT 20.6* 19.9*  MCV 85.1 84.0  PLT 86* 79*   Basic Metabolic Panel: Recent Labs  Lab 01/16/20 1008 01/17/20 0406  NA 135 137  K 3.9 3.4*  CL 103 107  CO2 17* 19*  GLUCOSE 92 91  BUN 90* 98*  CREATININE 5.76* 4.44*  CALCIUM 9.8  9.8 8.9  PHOS 4.6  --    GFR: Estimated Creatinine Clearance: 11.3 mL/min (A) (by C-G formula based on SCr of 4.44 mg/dL (H)). Liver Function Tests: Recent Labs  Lab 01/16/20 1008  AST 14*  ALT 9  ALKPHOS 87  BILITOT 0.3  PROT 7.7  ALBUMIN 3.8   No results for input(s): LIPASE, AMYLASE in the last 168 hours. No results for input(s): AMMONIA in the last 168 hours. Coagulation Profile: No results for input(s): INR, PROTIME in the last 168 hours. Cardiac Enzymes: No results for input(s): CKTOTAL, CKMB, CKMBINDEX, TROPONINI in the last 168 hours. BNP (last 3 results) No results for input(s): PROBNP in the last 8760 hours. HbA1C: No results for input(s): HGBA1C in the last 72 hours. CBG: No results for input(s): GLUCAP in the last 168 hours. Lipid Profile: No results for input(s): CHOL, HDL, LDLCALC, TRIG, CHOLHDL, LDLDIRECT in the last 72 hours. Thyroid Function Tests: No results for input(s): TSH, T4TOTAL, FREET4, T3FREE, THYROIDAB in the last 72 hours. Anemia Panel: No results for input(s): VITAMINB12, FOLATE, FERRITIN, TIBC, IRON, RETICCTPCT in the last 72 hours. Urine analysis:    Component Value Date/Time   COLORURINE STRAW (A) 01/16/2020 1755   APPEARANCEUR CLEAR (A) 01/16/2020 1755   LABSPEC 1.010 01/16/2020 1755   PHURINE 5.0 01/16/2020 1755   GLUCOSEU NEGATIVE 01/16/2020 1755   HGBUR NEGATIVE 01/16/2020 1755   BILIRUBINUR NEGATIVE 01/16/2020 1755   BILIRUBINUR negative 09/29/2017 [place] 01/16/2020 1755   PROTEINUR NEGATIVE 01/16/2020 1755    UROBILINOGEN 0.2 09/29/2017 1246   NITRITE NEGATIVE 01/16/2020 1755   LEUKOCYTESUR MODERATE (A) 01/16/2020 1755   Recent Results (from the past 240 hour(s))  SARS CORONAVIRUS 2 (TAT 6-24 HRS) Nasopharyngeal Nasopharyngeal Swab     Status: None   Collection Time: 01/16/20  5:17 PM   Specimen: Nasopharyngeal Swab  Result Value Ref Range Status   SARS Coronavirus 2 NEGATIVE NEGATIVE Final    Comment: (NOTE) SARS-CoV-2 target nucleic acids are NOT DETECTED. The SARS-CoV-2 RNA is generally detectable in upper and lower respiratory specimens during the acute phase of infection. Negative results do not preclude SARS-CoV-2 infection, do not rule out co-infections with other pathogens, and should not be used as the sole basis for treatment or other patient management decisions. Negative results must be combined with clinical observations, patient history, and epidemiological information. The expected result is Negative. Fact Sheet for Patients: SugarRoll.be Fact Sheet for Healthcare Providers: https://www.woods-mathews.com/ This test is not yet approved or cleared by the [place] FDA and  has been authorized for detection and/or diagnosis of SARS-CoV-2 by FDA under an Emergency Use Authorization (EUA). This EUA will remain  in effect (meaning this test can be used) for the duration of the COVID-19 declaration under Section 56 4(b)(1) of the Act, 21 U.S.C. section 360bbb-3(b)(1), unless the authorization is terminated or revoked sooner. Performed at [facility], [place] [address], [place], [place] [address]       Radiology Studies: US Renal  Result Date: 01/16/2020 CLINICAL DATA:  Acute on chronic renal insufficiency EXAM: RENAL / URINARY TRACT ULTRASOUND COMPLETE COMPARISON:  10/11/2019 FINDINGS: Right Kidney: Renal measurements: 8.8 x 3.8 by 4.3 cm = volume: 75 mL. Mild increased renal cortical echotexture. No focal abnormalities. Left  Kidney: Renal measurements: 10.5 x 4.1 x 5.1 cm = volume: 113 mL. No focal abnormalities mild increased renal cortical echotexture. Bladder: Appears normal for degree of bladder distention. Other: None. IMPRESSION: 1. Mild increased renal cortical echotexture consistent with medical renal disease. 2. Otherwise unremarkable renal ultrasound. Electronically Signed   By: Randa Ngo M.D.   On: 01/16/2020 16:40    Scheduled Meds: . sodium chloride   Intravenous Once  . cholecalciferol  1,000 Units Oral Daily  . cholestyramine  4 g Oral Daily  . clobetasol cream  1 application Topical BID  . hydroxychloroquine  200 mg Oral Daily  . letrozole  2.5 mg Oral Daily  . metoprolol succinate  50 mg Oral Daily  . mycophenolate  500 mg Oral BID  . potassium chloride  10 mEq Oral Daily   Continuous Infusions: . sodium chloride 1,000 mL (01/16/20 1754)  . cefTRIAXone (ROCEPHIN)  IV 1 g (01/17/20 1053)     LOS: 1 day   Time spent: 25 minutes.  Patrecia Pour, MD [facility] [email or web address] 01/17/2020, 3:00 PM

## 2020-01-17 NOTE — Progress Notes (Signed)
Initial Nutrition Assessment  DOCUMENTATION CODES:   Obesity unspecified  INTERVENTION:   Magic cup BID with lunch and dinner, each supplement provides 290 kcal and 9 grams of protein  Snacks  Liberalize diet   NUTRITION DIAGNOSIS:   Increased nutrient needs related to cancer and cancer related treatments as evidenced by increased estimated needs.  GOAL:   Patient will meet greater than or equal to 90% of their needs  MONITOR:   PO intake, Supplement acceptance, Labs, Weight trends, Skin, I & O's  REASON FOR ASSESSMENT:   Malnutrition Screening Tool    ASSESSMENT:   54 y.o. female with a history of metastatic breast cancer, CKD, CHF (EF 25 to 30%), lupus nephritis, who presents from clinic for AKI on CKD.   Pt is well known to this RD from multiple previous admits. Pt reports decreased appetite and oral intake pta r/t nausea and vomiting that she reports is related to her chemotherapy. Pt reports that after her medication, she will just throw up everything she just ate. Pt also reports intermittent diarrhea. Pt does not like supplements. Pt reports that she tries to eat good protein with her meals. Pt reports that she ate fairly well at breakfast this morning. Pt would like to have snacks between meals. RD will add snacks and supplements to meal trays. Pt enjoys yogurt, peanut butter crackers, jello and sandwiches. Of note, pt does wears dentures. RD will also liberalize pt's diet as the heart healthy diet is restrictive of protein. Per chart, pt is down 8lbs(5%) from her UBW; unsure if this is r/t dehydration. Pt is generally weight stable at baseline.   Medications reviewed and include: D3, questran, KCl, cellcept, NaCl @50ml /hr, ceftriaxone   Labs reviewed: K 3.4(L), BUN 98(H), creat 4.44(H), P 4.6 wnl Wbc- 1.6(L), Hgb 6.9(L), Hct 19.9(L)  NUTRITION - FOCUSED PHYSICAL EXAM:    Most Recent Value  Orbital Region  No depletion  Upper Arm Region  No depletion  Thoracic  and Lumbar Region  No depletion  Buccal Region  No depletion  Temple Region  No depletion  Clavicle Bone Region  No depletion  Clavicle and Acromion Bone Region  No depletion  Scapular Bone Region  No depletion  Dorsal Hand  No depletion  Patellar Region  No depletion  Anterior Thigh Region  No depletion  Posterior Calf Region  No depletion  Edema (RD Assessment)  None  Hair  Reviewed  Eyes  Reviewed  Mouth  Reviewed  Skin  Reviewed  Nails  Reviewed     Diet Order:   Diet Order            Diet regular Room service appropriate? Yes; Fluid consistency: Thin  Diet effective now             EDUCATION NEEDS:   Education needs have been addressed  Skin:  Skin Assessment: Reviewed RN Assessment  Last BM:  3/30- type 5  Height:   Ht Readings from Last 1 Encounters:  01/16/20 4\' 9"  (1.448 m)    Weight:   Wt Readings from Last 1 Encounters:  01/16/20 67.4 kg    Ideal Body Weight:  43.1 kg  BMI:  Body mass index is 32.15 kg/m.  Estimated Nutritional Needs:   Kcal:  1500-1700kcal/day  Protein:  75-85g/day  Fluid:  >1.3L/day  Knox Saliva MS, RD, LDN Please refer to AMION for RD and/or RD on-call/weekend/after hours pager

## 2020-01-17 NOTE — Progress Notes (Signed)
[facility]  ROUNDING NOTE   Subjective:  Patient seen and evaluated at bedside. She has been on recent chemotherapy which is been causing vomiting. Her creatinine upon presentation was 5.76. She is followed in our office for underlying chronic kidney disease and her baseline creatinine appears to be 1.7.  Renal function has improved today. Creatinine down to 4.4.   Objective:  Vital signs in last 24 hours:  Temp:  [97.5 F (36.4 C)-98.3 F (36.8 C)] 97.5 F (36.4 C) (03/31 0809) Pulse Rate:  [70-90] 83 (03/31 0809) Resp:  [12-18] 12 (03/31 0809) BP: (108-125)/(54-86) 117/71 (03/31 0809) SpO2:  [93 %-100 %] 100 % (03/31 0809) Weight:  [67.4 kg-67.7 kg] 67.4 kg (03/30 2300)  Weight change:  Filed Weights   01/16/20 1248 01/16/20 2030 01/16/20 2300  Weight: 67.7 kg 67.7 kg 67.4 kg    Intake/Output: I/O last 3 completed shifts: In: 95.6 [I.V.:95.6] Out: -    Intake/Output this shift:  No intake/output data recorded.  Physical Exam: General: No acute distress  Head: Normocephalic, atraumatic. Moist oral mucosal membranes  Eyes: Anicteric  Neck: Supple, trachea midline  Lungs:  Clear to auscultation, normal effort  Heart: S1S2 no rubs  Abdomen:  Soft, nontender, bowel sounds present  Extremities: No peripheral edema.  Neurologic: Awake, alert, following commands  Skin: No lesions       Basic Metabolic Panel: Recent Labs  Lab 01/16/20 1008 01/17/20 0406  NA 135 137  K 3.9 3.4*  CL 103 107  CO2 17* 19*  GLUCOSE 92 91  BUN 90* 98*  CREATININE 5.76* 4.44*  CALCIUM 9.8  9.8 8.9  PHOS 4.6  --     Liver Function Tests: Recent Labs  Lab 01/16/20 1008  AST 14*  ALT 9  ALKPHOS 87  BILITOT 0.3  PROT 7.7  ALBUMIN 3.8   No results for input(s): LIPASE, AMYLASE in the last 168 hours. No results for input(s): AMMONIA in the last 168 hours.  CBC: Recent Labs  Lab 01/16/20 1008 01/17/20 0406  WBC 2.1* 1.6*  NEUTROABS 1.2*  --   HGB 7.1*  6.9*  HCT 20.6* 19.9*  MCV 85.1 84.0  PLT 86* 79*    Cardiac Enzymes: No results for input(s): CKTOTAL, CKMB, CKMBINDEX, TROPONINI in the last 168 hours.  BNP: Invalid input(s): POCBNP  CBG: No results for input(s): GLUCAP in the last 168 hours.  Microbiology: Results for orders placed or performed during the hospital encounter of 01/16/20  SARS CORONAVIRUS 2 (TAT 6-24 HRS) Nasopharyngeal Nasopharyngeal Swab     Status: None   Collection Time: 01/16/20  5:17 PM   Specimen: Nasopharyngeal Swab  Result Value Ref Range Status   SARS Coronavirus 2 NEGATIVE NEGATIVE Final    Comment: (NOTE) SARS-CoV-2 target nucleic acids are NOT DETECTED. The SARS-CoV-2 RNA is generally detectable in upper and lower respiratory specimens during the acute phase of infection. Negative results do not preclude SARS-CoV-2 infection, do not rule out co-infections with other pathogens, and should not be used as the sole basis for treatment or other patient management decisions. Negative results must be combined with clinical observations, patient history, and epidemiological information. The expected result is Negative. Fact Sheet for Patients: SugarRoll.be Fact Sheet for Healthcare Providers: https://www.woods-mathews.com/ This test is not yet approved or cleared by the [place] FDA and  has been authorized for detection and/or diagnosis of SARS-CoV-2 by FDA under an Emergency Use Authorization (EUA). This EUA will remain  in effect (meaning this  test can be used) for the duration of the COVID-19 declaration under Section 56 4(b)(1) of the Act, 21 U.S.C. section 360bbb-3(b)(1), unless the authorization is terminated or revoked sooner. Performed at [facility], [place] [address], [place], [place] [address]     Coagulation Studies: No results for input(s): LABPROT, INR in the last 72 hours.  Urinalysis: Recent Labs    01/16/20 1755   COLORURINE STRAW*  LABSPEC 1.010  PHURINE 5.0  GLUCOSEU NEGATIVE  HGBUR NEGATIVE  BILIRUBINUR NEGATIVE  KETONESUR NEGATIVE  PROTEINUR NEGATIVE  NITRITE NEGATIVE  LEUKOCYTESUR MODERATE*      Imaging: US Renal  Result Date: 01/16/2020 CLINICAL DATA:  Acute on chronic renal insufficiency EXAM: RENAL / URINARY TRACT ULTRASOUND COMPLETE COMPARISON:  10/11/2019 FINDINGS: Right Kidney: Renal measurements: 8.8 x 3.8 by 4.3 cm = volume: 75 mL. Mild increased renal cortical echotexture. No focal abnormalities. Left Kidney: Renal measurements: 10.5 x 4.1 x 5.1 cm = volume: 113 mL. No focal abnormalities mild increased renal cortical echotexture. Bladder: Appears normal for degree of bladder distention. Other: None. IMPRESSION: 1. Mild increased renal cortical echotexture consistent with medical renal disease. 2. Otherwise unremarkable renal ultrasound. Electronically Signed   By: Randa Ngo M.D.   On: 01/16/2020 16:40     Medications:   . sodium chloride 1,000 mL (01/16/20 1754)  . cefTRIAXone (ROCEPHIN)  IV 1 g (01/17/20 1053)   . sodium chloride   Intravenous Once  . cholecalciferol  1,000 Units Oral Daily  . cholestyramine  4 g Oral Daily  . clobetasol cream  1 application Topical BID  . hydroxychloroquine  200 mg Oral Daily  . letrozole  2.5 mg Oral Daily  . metoprolol succinate  50 mg Oral Daily  . mycophenolate  500 mg Oral BID  . potassium chloride  10 mEq Oral Daily   acetaminophen **OR** acetaminophen, ondansetron **OR** ondansetron (ZOFRAN) IV  Assessment/ Plan:  54 y.o. female with past medical history of left breast cancer, congestive heart failure with ejection fraction of 25 to 30%, chronic kidney disease stage IIIb, hypertension, lupus nephritis, obesity, vitamin D deficiency who presented with acute kidney injury related to nausea and vomiting.  1.  Acute kidney injury/chronic kidney disease stage IIIb/history of lupus nephritis.  Suspect acute kidney injury related  to prolonged nausea and vomiting from chemotherapy.  Renal function starting to improve with IV fluid hydration as creatinine down to 4.4.  Renal ultrasound showed mildly increased renal cortical echotexture but no hydronephrosis.  Continue hydration for now.  No acute indication for dialysis.  No sign of active lupus nephritis now as no hematuria or proteinuria noted.  Patient maintained on CellCept 500 mg twice daily as well as hydroxychloroquine.  2.  Hypertension.  Maintain the patient on metoprolol 50 mg p.o. daily.  3.  Thanks for consultation.   LOS: 1 Susie Pousson 3/31/20211:35 PM

## 2020-01-18 ENCOUNTER — Telehealth: Payer: Self-pay | Admitting: *Deleted

## 2020-01-18 ENCOUNTER — Ambulatory Visit
Admission: RE | Admit: 2020-01-18 | Discharge: 2020-01-18 | Disposition: A | Discharge status: Home / Self Care | Payer: 59 | Source: Ambulatory Visit | Attending: Family | Admitting: Family

## 2020-01-18 ENCOUNTER — Other Ambulatory Visit: Payer: Self-pay | Admitting: Oncology

## 2020-01-18 DIAGNOSIS — N189 Chronic kidney disease, unspecified: Secondary | ICD-10-CM | POA: Insufficient documentation

## 2020-01-18 DIAGNOSIS — I5022 Chronic systolic (congestive) heart failure: Secondary | ICD-10-CM

## 2020-01-18 DIAGNOSIS — I130 Hypertensive heart and chronic kidney disease with heart failure and stage 1 through stage 4 chronic kidney disease, or unspecified chronic kidney disease: Secondary | ICD-10-CM | POA: Insufficient documentation

## 2020-01-18 DIAGNOSIS — I351 Nonrheumatic aortic (valve) insufficiency: Secondary | ICD-10-CM | POA: Insufficient documentation

## 2020-01-18 DIAGNOSIS — N179 Acute kidney failure, unspecified: Secondary | ICD-10-CM

## 2020-01-18 LAB — BPAM RBC
Blood Product Expiration Date: 202104132359
ISSUE DATE / TIME: 202103311624
Unit Type and Rh: 5100

## 2020-01-18 LAB — RENAL FUNCTION PANEL
Albumin: 3.1 g/dL — ABNORMAL LOW (ref 3.5–5.0)
Anion gap: 7 (ref 5–15)
BUN: 74 mg/dL — ABNORMAL HIGH (ref 6–20)
CO2: 20 mmol/L — ABNORMAL LOW (ref 22–32)
Calcium: 8.5 mg/dL — ABNORMAL LOW (ref 8.9–10.3)
Chloride: 108 mmol/L (ref 98–111)
Creatinine, Ser: 2.68 mg/dL — ABNORMAL HIGH (ref 0.44–1.00)
GFR calc Af Amer: 22 mL/min — ABNORMAL LOW (ref >60)
GFR calc non Af Amer: 19 mL/min — ABNORMAL LOW (ref >60)
Glucose, Bld: 88 mg/dL (ref 70–99)
Phosphorus: 2.9 mg/dL (ref 2.5–4.6)
Potassium: 3.8 mmol/L (ref 3.5–5.1)
Sodium: 135 mmol/L (ref 135–145)

## 2020-01-18 LAB — ECHOCARDIOGRAM COMPLETE
Height: 57 in
Weight: 2377.44 oz

## 2020-01-18 LAB — TYPE AND SCREEN
ABO/RH(D): B POS
Antibody Screen: NEGATIVE
Unit division: 0

## 2020-01-18 LAB — CBC
HCT: 23.1 % — ABNORMAL LOW (ref 36.0–46.0)
Hemoglobin: 8 g/dL — ABNORMAL LOW (ref 12.0–15.0)
MCH: 29.2 pg (ref 26.0–34.0)
MCHC: 34.6 g/dL (ref 30.0–36.0)
MCV: 84.3 fL (ref 80.0–100.0)
Platelets: 74 10*3/uL — ABNORMAL LOW (ref 150–400)
RBC: 2.74 MIL/uL — ABNORMAL LOW (ref 3.87–5.11)
RDW: 13 % (ref 11.5–15.5)
WBC: 2.2 10*3/uL — ABNORMAL LOW (ref 4.0–10.5)
nRBC: 0 % (ref 0.0–0.2)

## 2020-01-18 LAB — FANA STAINING PATTERNS: Speckled Pattern: 1:80 {titer}

## 2020-01-18 LAB — ANTINUCLEAR ANTIBODIES, IFA: ANA Ab, IFA: POSITIVE — AB

## 2020-01-18 MED ORDER — CEPHALEXIN 250 MG PO CAPS: 250.0000 mg | ORAL_CAPSULE | Freq: Two times a day (BID) | ORAL | 0 refills | Status: DC

## 2020-01-18 NOTE — Discharge Summary (Signed)
Physician Discharge Summary  Melanie Holloway VFI:[id number] DOB: 1966-02-20 DOA: 01/16/2020  PCP: Ria Bush, MD  Admit date: 01/16/2020 Discharge date: 01/18/2020  Admitted From: Home Disposition: Home   Recommendations for Outpatient Follow-up:  1. Follow up with PCP in 1-2 weeks 2. Please obtain BMP and CBC at follow up 3. Follow up with oncology for continued management. 4. Follow up with cardiology and nephrology as routine.  5. Note echocardiogram scheduled as outpatient on day of discharge was performed, though results still pending, on day of discharge.  Home Health: None new Equipment/Devices: None new Discharge Condition: Stable CODE STATUS: Full Diet recommendation: Heart healthy  Brief/Interim Summary: Melanie Holloway is a 54 y.o. female with a history of lupus nephritis, stage IIIb CKD, AOCD, breast CA on chemotherapy s/p XRT, chronic HFrEF (LVEF 25-30%) who presented to the ED on the advice of her physician due to worsened renal function in the setting of severe vomiting and diarrhea. Creatinine was 5.76 (previous baseline 1.74). IV fluids were started and patient admitted. Creatinine improved and nausea, vomiting and diarrhea have also resolved. She's eating and drinking normally, feels ready to go home.  Discharge Diagnoses:  Active Problems:   Anemia of chronic disease   Malignant neoplasm of female breast (HCC)   Pancytopenia (HCC)   Lupus (HCC)   Chronic systolic CHF (congestive heart failure) ([place])   Acute kidney injury superimposed on CKD ([place])  AKI on stage IIIb CKD: Cr down to 4.4. U/S with mildly increased echotexture without significant hydronephrosis. Bland sediment and normal complement levels argue against active lupus. - Now that taking normal po, renal function dramatically improved, will restart home medications. Nephrology concurs with stability for discharge.  Dehydration due to vomiting and diarrhea due to chemotherapy: Improved dramatically  since holding medication.  - Taking adequate po now. prn antiemetics. Do not feel stool testing is necessary.  AOCD:  - Hgb improved as expected to 8.0 s/p 1u PRBCs 3/31 (was scheduled for this as outpatient 4/2).   UTI: Many bacteria, 21-50WBCs/HPF on clean catch. In setting of immunosuppression. No culture was sent.  - Given ceftriaxone x2 days. Will continue with keflex 250mg  po BID x5 days.  Chronic HFrEF (EF 25-30%): Not currently with evidence of overload.  - Restart home medications - Echocardiogram was scheduled for today as outpatient. The echo ultrasonographer performed this in the patient's room prior to discharge.  Lupus with nephritis:  - Continue cellcept, plaquenil.   Pancytopenia: Due to chemotherapy.  - Monitor at follow up.  Breast CA:  - Continue with oncology follow up.  - Restart letrozole  RN Pressure Injury Documentation: Pressure Injury 03/04/18 Stage II -  Partial thickness loss of dermis presenting as a shallow open ulcer with a red, pink wound bed without slough. (Active)  03/04/18 0606  Location: Buttocks  Location Orientation: Left  Staging: Stage II -  Partial thickness loss of dermis presenting as a shallow open ulcer with a red, pink wound bed without slough.  Wound Description (Comments):   Present on Admission: Yes    Obesity: Estimated body mass index is 32.15 kg/m as calculated from the following:   Height as of this encounter: 4\' 9"  (1.448 m).   Weight as of this encounter: 67.4 kg.  Discharge Instructions Discharge Instructions    Diet - low sodium heart healthy   Complete by: As directed    Discharge instructions   Complete by: As directed    You were admitted for renal failure  due to dehydration which has improved dramatically with holding chemotherapy. Your kidney function is much better and you are stable for discharge. Urine analysis strongly suggests infection and you've started treatment with antibiotics that will be  continued in the form of keflex 250mg  twice daily for 5 more days (start tomorrow morning).   Follow up with oncology, nephrology, and your PCP shortly after discharge, or seek medical attention sooner if your symptoms return. You will need recheck of labs within the next week.   Increase activity slowly   Complete by: As directed      Allergies as of 01/18/2020   No Known Allergies     Medication List    TAKE these medications   acetaminophen 325 MG tablet Commonly known as: TYLENOL Take 2 tablets (650 mg total) by mouth every 4 (four) hours as needed for headache or mild pain.   alendronate 70 MG tablet Commonly known as: FOSAMAX TAKE 1 TABLET BY MOUTH ONCE A WEEK. TAKE WITH A FULL GLASS OF WATER ON AN EMPTY STOMACH. What changed: See the new instructions.   cholestyramine 4 g packet Commonly known as: Questran Take 1 packet (4 g total) by mouth daily.   clobetasol cream 0.05 % Commonly known as: TEMOVATE Apply 1 application topically 2 (two) times daily.   Entresto 24-26 MG Generic drug: sacubitril-valsartan Take 1 tablet by mouth daily.   letrozole 2.5 MG tablet Commonly known as: FEMARA Take 2.5 mg by mouth daily.   magnesium oxide 400 MG tablet Commonly known as: MAG-OX Take 400 mg by mouth daily.   metoCLOPramide 5 MG tablet Commonly known as: Reglan Take 1 tablet (5 mg total) by mouth daily. Take 1 hour prior to verzenio What changed:   when to take this  reasons to take this  additional instructions   metoprolol succinate 50 MG 24 hr tablet Commonly known as: TOPROL-XL Take 1 tablet (50 mg total) by mouth daily. Take with or immediately following a meal.   mycophenolate 250 MG capsule Commonly known as: CELLCEPT Take 2 capsules (500 mg total) by mouth 2 (two) times daily.   Plaquenil 200 MG tablet Generic drug: hydroxychloroquine Take 1 tablet (200 mg total) by mouth daily.   potassium chloride 10 MEQ tablet Commonly known as: KLOR-CON Take  10 mEq by mouth every evening.   torsemide 20 MG tablet Commonly known as: DEMADEX Take 2 tablets (40 mg total) by mouth 2 (two) times daily.   Vitamin D 50 MCG (2000 UT) Caps Take 1 capsule (2,000 Units total) by mouth daily.      Follow-up Information    Magrinat, Virgie Dad, MD Follow up.   Specialty: Oncology Contact information: [place] [address] [phone]        Ria Bush, MD .   Specialty: [facility] Medicine Contact information: [place] [address] [phone]        Lavonia Dana, MD .   Specialty: Nephrology Contact information: [address] [place] [address] [phone]        Isaias Cowman, MD .   Specialty: Cardiology Contact information: [facility] [place] [address] [phone]          No Known Allergies  Consultations:  Nephrology  Procedures/Studies: US Renal  Result Date: 01/16/2020 CLINICAL DATA:  Acute on chronic renal insufficiency EXAM: RENAL / URINARY TRACT ULTRASOUND COMPLETE COMPARISON:  10/11/2019 FINDINGS: Right Kidney: Renal measurements: 8.8 x  3.8 by 4.3 cm = volume: 75 mL. Mild increased renal cortical echotexture. No focal abnormalities. Left Kidney: Renal measurements: 10.5 x 4.1 x 5.1 cm = volume: 113 mL. No focal abnormalities mild increased renal cortical echotexture. Bladder: Appears normal for degree of bladder distention. Other: None. IMPRESSION: 1. Mild increased renal cortical echotexture consistent with medical renal disease. 2. Otherwise unremarkable renal ultrasound. Electronically Signed   By: Randa Ngo M.D.   On: 01/16/2020 16:40   ECHOCARDIOGRAM COMPLETE  Result Date: 01/18/2020    ECHOCARDIOGRAM REPORT   Patient Name:   ADANNA ZUCKERMAN Date of Exam: 01/18/2020 Medical Rec #:  [id number]      Height:       57.0 in Accession #:    [id number]     Weight:       148.6 lb  Date of Birth:  1966-09-30      BSA:          1.585 m Patient Age:    54 years       BP:           116/77 mmHg Patient Gender: F              HR:           85 bpm. Exam Location:  [facility] Procedure: 2D Echo, Cardiac Doppler and Color Doppler Indications:     CHF 428.0  History:         Patient has prior history of Echocardiogram examinations, most                  recent 02/09/2018. CHF; Risk Factors:Hypertension. CKD.  Sonographer:     Sherrie Sport RDCS (AE) Referring Phys:  [id number] Alisa Graff Diagnosing Phys: Bartholome Bill MD  Sonographer Comments: Image acquisition challenging due to mastectomy. IMPRESSIONS  1. Left ventricular ejection fraction, by estimation, is 70 to 75%. The left ventricle has hyperdynamic function. The left ventricle has no regional wall motion abnormalities. Left ventricular diastolic parameters were normal.  2. Right ventricular systolic function is normal. The right ventricular size is mildly enlarged. There is normal pulmonary artery systolic pressure.  3. The mitral valve is grossly normal. Mild mitral valve regurgitation.  4. The aortic valve was not well visualized. Aortic valve regurgitation is mild. Mild to moderate aortic valve sclerosis/calcification is present, without any evidence of aortic stenosis. FINDINGS  Left Ventricle: Left ventricular ejection fraction, by estimation, is 70 to 75%. The left ventricle has hyperdynamic function. The left ventricle has no regional wall motion abnormalities. The left ventricular internal cavity size was normal in size. There is borderline left ventricular hypertrophy. Left ventricular diastolic parameters were normal. Right Ventricle: The right ventricular size is mildly enlarged. No increase in right ventricular wall thickness. Right ventricular systolic function is normal. There is normal pulmonary artery systolic pressure. The tricuspid regurgitant velocity is 1.97  m/s, and with an assumed right atrial pressure of 10 mmHg, the estimated  right ventricular systolic pressure is 21.1 mmHg. Left Atrium: Left atrial size was normal in size. Right Atrium: Right atrial size was normal in size. Pericardium: There is no evidence of pericardial effusion. Mitral Valve: The mitral valve is grossly normal. Mild mitral valve regurgitation. Tricuspid Valve: The tricuspid valve is not well visualized. Tricuspid valve regurgitation is mild. Aortic Valve: The aortic valve was not well visualized. Aortic valve regurgitation is mild. Mild to moderate aortic valve sclerosis/calcification is present, without any evidence of aortic stenosis. Aortic valve mean gradient  measures 4.0 mmHg. Aortic valve peak gradient measures 8.5 mmHg. Aortic valve area, by VTI measures 1.96 cm. Pulmonic Valve: The pulmonic valve was not well visualized. Pulmonic valve regurgitation is not visualized. Aorta: The aortic root is normal in size and structure. IAS/Shunts: The interatrial septum was not assessed.  LEFT VENTRICLE PLAX 2D LVIDd:         4.02 cm  Diastology LVIDs:         2.33 cm  LV e' lateral:   5.55 cm/s LV PW:         0.97 cm  LV E/e' lateral: 12.3 LV IVS:        0.67 cm  LV e' medial:    3.48 cm/s LVOT diam:     2.00 cm  LV E/e' medial:  19.7 LV SV:         45 LV SV Index:   29 LVOT Area:     3.14 cm  RIGHT VENTRICLE RV Basal diam:  3.59 cm RV S prime:     11.30 cm/s TAPSE (M-mode): 3.8 cm LEFT ATRIUM             Index       RIGHT ATRIUM           Index LA diam:        2.50 cm 1.58 cm/m  RA Area:     15.30 cm LA Vol (A2C):   52.5 ml 33.12 ml/m RA Volume:   41.30 ml  26.05 ml/m LA Vol (A4C):   25.5 ml 16.09 ml/m LA Biplane Vol: 38.3 ml 24.16 ml/m  AORTIC VALVE                   PULMONIC VALVE AV Area (Vmax):    1.69 cm    PV Vmax:        0.53 m/s AV Area (Vmean):   1.79 cm    PV Peak grad:   1.1 mmHg AV Area (VTI):     1.96 cm    RVOT Peak grad: 2 mmHg AV Vmax:           145.67 cm/s AV Vmean:          92.933 cm/s AV VTI:            0.231 m AV Peak Grad:      8.5 mmHg  AV Mean Grad:      4.0 mmHg LVOT Vmax:         78.30 cm/s LVOT Vmean:        52.900 cm/s LVOT VTI:          0.144 m LVOT/AV VTI ratio: 0.62  AORTA Ao Root diam: 2.80 cm MITRAL VALVE               TRICUSPID VALVE MV Area (PHT): 5.13 cm    TR Peak grad:   15.5 mmHg MV Decel Time: 148 msec    TR Vmax:        197.00 cm/s MV E velocity: 68.40 cm/s MV A velocity: 94.10 cm/s  SHUNTS MV E/A ratio:  0.73        Systemic VTI:  0.14 m                            Systemic Diam: 2.00 cm Bartholome Bill MD Electronically signed by Bartholome Bill MD Signature Date/Time: 01/18/2020/12:32:51 PM    Final     Subjective: Feels well,  eating/drinking normally. Wants to go home. Urinating normally.   Discharge Exam: Vitals:   01/18/20 0023 01/18/20 0744  BP: 119/90 116/77  Pulse: 83 85  Resp: 14 18  Temp: 98 F (36.7 C) 98.8 F (37.1 C)  SpO2: 100% 100%   General: Pt is alert, awake, not in acute distress Cardiovascular: RRR, S1/S2 +, no rubs, no gallops Respiratory: CTA bilaterally, no wheezing, no rhonchi Abdominal: Soft, NT, ND, bowel sounds + Extremities: No edema, no cyanosis  Labs: BNP (last 3 results) No results for input(s): BNP in the last 8760 hours. Basic Metabolic Panel: Recent Labs  Lab 01/16/20 1008 01/17/20 0406 01/18/20 0344  NA 135 137 135  K 3.9 3.4* 3.8  CL 103 107 108  CO2 17* 19* 20*  GLUCOSE 92 91 88  BUN 90* 98* 74*  CREATININE 5.76* 4.44* 2.68*  CALCIUM 9.8  9.8 8.9 8.5*  PHOS 4.6  --  2.9   Liver Function Tests: Recent Labs  Lab 01/16/20 1008 01/18/20 0344  AST 14*  --   ALT 9  --   ALKPHOS 87  --   BILITOT 0.3  --   PROT 7.7  --   ALBUMIN 3.8 3.1*   No results for input(s): LIPASE, AMYLASE in the last 168 hours. No results for input(s): AMMONIA in the last 168 hours. CBC: Recent Labs  Lab 01/16/20 1008 01/17/20 0406 01/17/20 2227 01/18/20 0344  WBC 2.1* 1.6*  --  2.2*  NEUTROABS 1.2*  --   --   --   HGB 7.1* 6.9* 8.1* 8.0*  HCT 20.6* 19.9* 23.6* 23.1*   MCV 85.1 84.0  --  84.3  PLT 86* 79*  --  74*   Cardiac Enzymes: No results for input(s): CKTOTAL, CKMB, CKMBINDEX, TROPONINI in the last 168 hours. BNP: Invalid input(s): POCBNP CBG: No results for input(s): GLUCAP in the last 168 hours. D-Dimer No results for input(s): DDIMER in the last 72 hours. Hgb A1c No results for input(s): HGBA1C in the last 72 hours. Lipid Profile No results for input(s): CHOL, HDL, LDLCALC, TRIG, CHOLHDL, LDLDIRECT in the last 72 hours. Thyroid function studies No results for input(s): TSH, T4TOTAL, T3FREE, THYROIDAB in the last 72 hours.  Invalid input(s): FREET3 Anemia work up No results for input(s): VITAMINB12, FOLATE, FERRITIN, TIBC, IRON, RETICCTPCT in the last 72 hours. Urinalysis    Component Value Date/Time   COLORURINE STRAW (A) 01/16/2020 1755   APPEARANCEUR CLEAR (A) 01/16/2020 1755   LABSPEC 1.010 01/16/2020 1755   PHURINE 5.0 01/16/2020 1755   GLUCOSEU NEGATIVE 01/16/2020 1755   HGBUR NEGATIVE 01/16/2020 1755   BILIRUBINUR NEGATIVE 01/16/2020 1755   BILIRUBINUR negative 09/29/2017 [place] 01/16/2020 1755   PROTEINUR NEGATIVE 01/16/2020 1755   UROBILINOGEN 0.2 09/29/2017 1246   NITRITE NEGATIVE 01/16/2020 1755   LEUKOCYTESUR MODERATE (A) 01/16/2020 1755    Microbiology Recent Results (from the past 240 hour(s))  SARS CORONAVIRUS 2 (TAT 6-24 HRS) Nasopharyngeal Nasopharyngeal Swab     Status: None   Collection Time: 01/16/20  5:17 PM   Specimen: Nasopharyngeal Swab  Result Value Ref Range Status   SARS Coronavirus 2 NEGATIVE NEGATIVE Final    Comment: (NOTE) SARS-CoV-2 target nucleic acids are NOT DETECTED. The SARS-CoV-2 RNA is generally detectable in upper and lower respiratory specimens during the acute phase of infection. Negative results do not preclude SARS-CoV-2 infection, do not rule out co-infections with other pathogens, and should not be used as the sole basis for  treatment or other patient  management decisions. Negative results must be combined with clinical observations, patient history, and epidemiological information. The expected result is Negative. Fact Sheet for Patients: SugarRoll.be Fact Sheet for Healthcare Providers: https://www.woods-mathews.com/ This test is not yet approved or cleared by the [place] FDA and  has been authorized for detection and/or diagnosis of SARS-CoV-2 by FDA under an Emergency Use Authorization (EUA). This EUA will remain  in effect (meaning this test can be used) for the duration of the COVID-19 declaration under Section 56 4(b)(1) of the Act, 21 U.S.C. section 360bbb-3(b)(1), unless the authorization is terminated or revoked sooner. Performed at [facility], [place] [address], [place], [place] [address]     Time coordinating discharge: Approximately 40 minutes  Patrecia Pour, MD  [facility] 01/20/2020, 5:09 PM

## 2020-01-18 NOTE — Telephone Encounter (Signed)
Pt called & states she has been in the hospital & had blood transfusion yest & was d/c today.  She has an appt for tomorrow for lab/blood transfusion & thinks that needs to be cancelled.  Message to Dr Jana Hakim to verify.  Last hgb was 8.1 & note seen regarding starting epo with next visit.

## 2020-01-18 NOTE — Progress Notes (Signed)
Patient ready for discharge, daughter at bedside, iv removed, avs given, education given. No complaints at this time.

## 2020-01-18 NOTE — Progress Notes (Signed)
*  PRELIMINARY RESULTS* Echocardiogram 2D Echocardiogram has been performed.  Melanie Holloway 01/18/2020, 10:17 AM

## 2020-01-18 NOTE — Progress Notes (Signed)
General was not able to tolerate abemaciclib even at decreased doses.  She is tolerating the fulvestrant well.  She remains significantly anemic.  She has stage III/IV renal failure which is likely the cause of the anemia.  I think she will benefit from EPO and we are starting that with her next visit here.

## 2020-01-18 NOTE — Plan of Care (Signed)

## 2020-01-18 NOTE — Discharge Instructions (Signed)
Acute Kidney Injury, Adult  Acute kidney injury is a sudden worsening of kidney function. The kidneys are organs that have several jobs. They filter the blood to remove waste products and extra fluid. They also maintain a healthy balance of minerals and hormones in the body, which helps control blood pressure and keep bones strong. With this condition, your kidneys do not do their jobs as well as they should. This condition ranges from mild to severe. Over time it may develop into long-lasting (chronic) kidney disease. Early detection and treatment may prevent acute kidney injury from developing into a chronic condition. What are the causes? Common causes of this condition include:  A problem with blood flow to the kidneys. This may be caused by: ? Low blood pressure (hypotension) or shock. ? Blood loss. ? Heart and blood vessel (cardiovascular) disease. ? Severe burns. ? Liver disease.  Direct damage to the kidneys. This may be caused by: ? Certain medicines. ? A kidney infection. ? Poisoning. ? Being around or in contact with toxic substances. ? A surgical wound. ? A hard, direct hit to the kidney area.  A sudden blockage of urine flow. This may be caused by: ? Cancer. ? Kidney stones. ? An enlarged prostate in males. What are the signs or symptoms? Symptoms of this condition may not be obvious until the condition becomes severe. Symptoms of this condition can include:  Tiredness (lethargy), or difficulty staying awake.  Nausea or vomiting.  Swelling (edema) of the face, legs, ankles, or feet.  Problems with urination, such as: ? Abdominal pain, or pain along the side of your stomach (flank). ? Decreased urine production. ? Decrease in the force of urine flow.  Muscle twitches and cramps, especially in the legs.  Confusion or trouble concentrating.  Loss of appetite.  Fever. How is this diagnosed? This condition may be diagnosed with tests, including:  Blood  tests.  Urine tests.  Imaging tests.  A test in which a sample of tissue is removed from the kidneys to be examined under a microscope (kidney biopsy). How is this treated? Treatment for this condition depends on the cause and how severe the condition is. In mild cases, treatment may not be needed. The kidneys may heal on their own. In more severe cases, treatment will involve:  Treating the cause of the kidney injury. This may involve changing any medicines you are taking or adjusting your dosage.  Fluids. You may need specialized IV fluids to balance your body's needs.  Having a catheter placed to drain urine and prevent blockages.  Preventing problems from occurring. This may mean avoiding certain medicines or procedures that can cause further injury to the kidneys. In some cases treatment may also require:  A procedure to remove toxic wastes from the body (dialysis or continuous renal replacement therapy - CRRT).  Surgery. This may be done to repair a torn kidney, or to remove the blockage from the urinary system. Follow these instructions at home: Medicines  Take over-the-counter and prescription medicines only as told by your health care provider.  Do not take any new medicines without your health care provider's approval. Many medicines can worsen your kidney damage.  Do not take any vitamin and mineral supplements without your health care provider's approval. Many nutritional supplements can worsen your kidney damage. Lifestyle  If your health care provider prescribed changes to your diet, follow them. You may need to decrease the amount of protein you eat.  Achieve and maintain a healthy  weight. If you need help with this, ask your health care provider.  Start or continue an exercise plan. Try to exercise at least 30 minutes a day, 5 days a week.  Do not use any tobacco products, such as cigarettes, chewing tobacco, and e-cigarettes. If you need help quitting, ask your  health care provider. General instructions  Keep track of your blood pressure. Report changes in your blood pressure as told by your health care provider.  Stay up to date with immunizations. Ask your health care provider which immunizations you need.  Keep all follow-up visits as told by your health care provider. This is important. Where to find more information  American Association of Kidney Patients: BombTimer.gl  National Kidney Foundation: www.kidney.[place]: https://mathis.com/  [facility]: ? www.lifeoptions.org ? www.kidneyschool.org Contact a health care provider if:  Your symptoms get worse.  You develop new symptoms. Get help right away if:  You develop symptoms of worsening kidney disease, which include: ? Headaches. ? Abnormally dark or light skin. ? Easy bruising. ? Frequent hiccups. ? Chest pain. ? Shortness of breath. ? End of menstruation in women. ? Seizures. ? Confusion or altered mental status. ? Abdominal or back pain. ? Itchiness.  You have a fever.  Your body is producing less urine.  You have pain or bleeding when you urinate. Summary  Acute kidney injury is a sudden worsening of kidney function.  Acute kidney injury can be caused by problems with blood flow to the kidneys, direct damage to the kidneys, and sudden blockage of urine flow.  Symptoms of this condition may not be obvious until it becomes severe. Symptoms may include edema, lethargy, confusion, nausea or vomiting, and problems passing urine.  This condition can usually be diagnosed with blood tests, urine tests, and imaging tests. Sometimes a kidney biopsy is done to diagnose this condition.  Treatment for this condition often involves treating the underlying cause. It is treated with fluids, medicines, dialysis, diet changes, or surgery. This information is not intended to replace advice given to you by your health care provider. Make  sure you discuss any questions you have with your health care provider. Document Revised: 09/17/2017 Document Reviewed: 09/25/2016 Elsevier Patient Education  [place].   Chronic Diarrhea Chronic diarrhea is a condition in which a person passes frequent loose and watery stools for 4 weeks or longer. Non-chronic diarrhea usually lasts for only 2-3 days. Diarrhea can cause a person to feel weak and dehydrated. Dehydration can make the person tired and thirsty. It can also cause a dry mouth, decreased urination, and dark yellow urine. Diarrhea is a sign of an underlying problem, such as:  Infection.  Side effects of medicines.  Problems digesting something in your diet, such as milk products if you have lactose intolerance.  Conditions such as celiac disease, irritable bowel syndrome (IBS), or inflammatory bowel disease (IBD). If you have chronic diarrhea, make sure you treat it as told by your health care provider. Follow these instructions at home: Medicines  Take over-the-counter and prescription medicines only as told by your health care provider.  If you were prescribed an antibiotic medicine, take it as told by your health care provider. Do not stop taking the antibiotic even if you start to feel better. Eating and drinking   Follow instructions from your health care provider about what to eat and drink. You may have to: ? Avoid foods that trigger diarrhea for you. ? Take an  oral rehydration solution (ORS). This is a drink that keeps you hydrated. It can be found at pharmacies and retail stores. ? Drink clear fluids, such as water, diluted fruit juice, and low-calorie sports drinks. You can also get fluids by sucking on ice chips. ? Drink enough fluid to keep your urine pale yellow. This will help you avoid dehydration. ? Eat small amounts of bland foods that are easy to digest as you are able. These foods include bananas, applesauce, rice, lean meats, toast, and  crackers. ? Avoid spicy or fatty foods. ? Avoid foods and beverages that contain a lot of sugar or caffeine.  Do not drink alcohol if: ? Your health care provider tells you not to drink. ? You are pregnant, may be pregnant, or are planning to become pregnant.  If you drink alcohol: ? Limit how much you use to:  0-1 drink a day for women.  0-2 drinks a day for men. ? Be aware of how much alcohol is in your drink. In the [place] one drink equals one 12 oz bottle of beer (355 mL), one 5 oz glass of wine (148 mL), or one 1 oz glass of hard liquor (44 mL). General instructions   Wash your hands often and after each diarrhea episode. Use soap and water. If soap and water are not available, use hand sanitizer.  Make sure that all people in your household wash their hands well and often.  Rest as told by your health care provider.  Watch your condition for any changes.  Take a warm bath to relieve any burning or pain from frequent diarrhea episodes.  Keep all follow-up visits as told by your health care provider. This is important. Contact a health care provider if:  You have a fever.  Your diarrhea gets worse or does not get better.  You have new symptoms.  You cannot drink fluid without vomiting.  You feel light-headed or dizzy.  You have a headache.  You have muscle cramps.  You have severe pain in the rectum. Get help right away if:  You have vomiting that does not go away.  You have chest pain.  You feel very weak or you faint.  You have bloody or black stools, or stools that look like tar.  You have severe pain, cramping, or bloating in your abdomen, or pain that stays in one place.  You have trouble breathing or you are breathing very quickly.  Your heart is beating very quickly.  Your skin feels cold and clammy.  You feel confused.  You have a severe headache.  You have signs or symptoms of dehydration, such as: ? Dark urine, very little urine, or  no urine. ? Cracked lips. ? Dry mouth. ? Sunken eyes. ? Sleepiness. ? Weakness. These symptoms may represent a serious problem that is an emergency. Do not wait to see if the symptoms will go away. Get medical help right away. Call your local emergency services (911 in the [place] Do not drive yourself to the hospital. Summary  Chronic diarrhea is a condition in which a person passes frequent loose and watery stools for 4 weeks or longer.  Diarrhea is a sign of an underlying problem.  Make sure you treat your diarrhea as told by your health care provider.  Drink enough fluid to keep your urine pale yellow. This will help you avoid dehydration.  Wash your hands often and after each diarrhea episode. If soap and water are not available, use hand  sanitizer. This information is not intended to replace advice given to you by your health care provider. Make sure you discuss any questions you have with your health care provider. Document Revised: 04/03/2019 Document Reviewed: 04/03/2019 Elsevier Patient Education  [place].   Contrast-Induced Nephropathy Contrast-induced nephropathy (CIN) is a rare type of kidney damage caused by the dye that may be used in certain imaging tests. This dye is called a contrast medium. For these tests, the dye may be injected into a person's body to make tissue or blood vessels show up more clearly. A contrast medium may be used in imaging tests such as:  CT scans.  MRIs.  Angiograms. With CIN, the contrast medium may damage kidney cells directly. It may also cause a reaction in the kidneys that decreases blood supply and oxygen to the kidneys. Lack of blood and oxygen causes temporary kidney damage that usually gets better in about 2 weeks. Very rarely, severe damage may require the kidneys to be filtered by a machine (dialysis). What are the causes? The exact cause of CIN is not known. In some cases, there may be more than one cause. What  increases the risk? The following factors may make you more likely to develop this condition:  Having kidney disease before you are exposed to contrast media. This is the main risk factor.  Having other conditions, such as: ? Dehydration. ? Diabetes. ? High blood pressure. ? Low blood pressure. ? Heart disease. ? Anemia. ? A type of blood cancer that affects the kidneys (multiple myeloma).  Older age.  Needing an emergency exam that requires the use of contrast media.  Receiving a high dose of contrast media or receiving contrast media within 72 hours of the previous time you got it.  Having contrast media injected into a blood vessel that carries blood away from the heart (artery).  Being on a medicine that affects kidney function. These include NSAIDs, some antibiotics, and some cancer drugs. What are the signs or symptoms? Symptoms of this condition often develop 2-3 days after getting contrast media. Symptoms may include:  Feeling tired (fatigue).  Appetite loss.  Swelling of the feet or ankles.  Puffy, swollen eyes.  Dry, itchy skin. In some cases, there are no symptoms. How is this diagnosed? This condition may be diagnosed based on:  Your symptoms and medical history. Your health care provider may suspect CIN if you recently had an imaging test or procedure that included the use of contrast media.  A physical exam.  Blood and urine tests to check for kidney damage. How is this treated? There is no specific treatment for CIN. If you develop CIN, you may be given fluids through an IV that is inserted into one of your veins. This will help keep your kidneys active. Electrolytes may be added to the fluid if you have an electrolyte imbalance. In most cases, CIN will get better in about 2 weeks. In rare cases, you may need kidney dialysis if kidney failure occurs and urine flow decreases. Even if you need dialysis, the condition usually improves over time. Follow these  instructions at home:   Take over-the-counter and prescription medicines only as told by your health care provider. Over-the-counter NSAIDs, such as aspirin or ibuprofen, can increase your risk of CIN.  Return to your normal activities as told by your health care provider. Ask your health care provider what activities are safe for you.  Drink enough fluid to keep your urine pale yellow.  Always let a health care provider know that you have a history of CIN before having any test or procedure that may include contrast material.  Keep all follow-up visits as told by your health care provider. This is important. How is this prevented? Take the following steps to help reduce your risk for developing CIN:  Tell your health care provider about any risk factors you have for CIN.  Ask if a contrast medium is necessary for imaging tests or procedures you are scheduled to have. Ask if there is an alternative to the test.  Make sure you drink plenty of fluids prior to any imaging tests or procedures that require contrast medium.  If possible, do not undergo multiple imaging tests or procedures that require contrast medium unless enough time has passed between tests. Contact a health care provider if:  You have any signs or symptoms of CIN.  You are urinating less than usual. Summary  Contrast-induced nephropathy (CIN) is a rare type of kidney damage caused by contrast medium.  The exact cause of CIN is not known. In some cases, there may be more than one cause.  Sometimes CIN does not cause any symptoms. If you do have symptoms, they may start 2-3 days after getting contrast medium.  To confirm the diagnosis, you may have blood tests and urine tests to check for kidney damage.  There is no specific treatment for CIN. In most cases, this condition will usually get better in about 2 weeks. You may be given IV fluids with electrolytes. In rare cases, dialysis is needed. This information is  not intended to replace advice given to you by your health care provider. Make sure you discuss any questions you have with your health care provider. Document Revised: 01/17/2018 Document Reviewed: 01/17/2018 Elsevier Patient Education  [place].

## 2020-01-18 NOTE — Progress Notes (Signed)
[facility]  ROUNDING NOTE   Subjective:  Late entry. Patient seen prior to discharge. Feeling much better. Creatinine down to 2.68 today.   Objective:  Vital signs in last 24 hours:  Temp:  [98 F (36.7 C)-98.8 F (37.1 C)] 98.8 F (37.1 C) (04/01 0744) Pulse Rate:  [77-85] 85 (04/01 0744) Resp:  [13-18] 18 (04/01 0744) BP: (111-128)/(70-90) 116/77 (04/01 0744) SpO2:  [100 %] 100 % (04/01 0744)  Weight change:  Filed Weights   01/16/20 1248 01/16/20 2030 01/16/20 2300  Weight: 67.7 kg 67.7 kg 67.4 kg    Intake/Output: I/O last 3 completed shifts: In: 1290.6 [P.O.:720; I.V.:95.6; Blood:375; IV Piggyback:100] Out: -    Intake/Output this shift:  Total I/O In: 240 [P.O.:240] Out: -   Physical Exam: General: No acute distress  Head: Normocephalic, atraumatic. Moist oral mucosal membranes  Eyes: Anicteric  Neck: Supple, trachea midline  Lungs:  Clear to auscultation, normal effort  Heart: S1S2 no rubs  Abdomen:  Soft, nontender, bowel sounds present  Extremities: No peripheral edema.  Neurologic: Awake, alert, following commands  Skin: No lesions       Basic Metabolic Panel: Recent Labs  Lab 01/16/20 1008 01/17/20 0406 01/18/20 0344  NA 135 137 135  K 3.9 3.4* 3.8  CL 103 107 108  CO2 17* 19* 20*  GLUCOSE 92 91 88  BUN 90* 98* 74*  CREATININE 5.76* 4.44* 2.68*  CALCIUM 9.8  9.8 8.9 8.5*  PHOS 4.6  --  2.9    Liver Function Tests: Recent Labs  Lab 01/16/20 1008 01/18/20 0344  AST 14*  --   ALT 9  --   ALKPHOS 87  --   BILITOT 0.3  --   PROT 7.7  --   ALBUMIN 3.8 3.1*   No results for input(s): LIPASE, AMYLASE in the last 168 hours. No results for input(s): AMMONIA in the last 168 hours.  CBC: Recent Labs  Lab 01/16/20 1008 01/17/20 0406 01/17/20 2227 01/18/20 0344  WBC 2.1* 1.6*  --  2.2*  NEUTROABS 1.2*  --   --   --   HGB 7.1* 6.9* 8.1* 8.0*  HCT 20.6* 19.9* 23.6* 23.1*  MCV 85.1 84.0  --  84.3  PLT 86* 79*  --   74*    Cardiac Enzymes: No results for input(s): CKTOTAL, CKMB, CKMBINDEX, TROPONINI in the last 168 hours.  BNP: Invalid input(s): POCBNP  CBG: No results for input(s): GLUCAP in the last 168 hours.  Microbiology: Results for orders placed or performed during the hospital encounter of 01/16/20  SARS CORONAVIRUS 2 (TAT 6-24 HRS) Nasopharyngeal Nasopharyngeal Swab     Status: None   Collection Time: 01/16/20  5:17 PM   Specimen: Nasopharyngeal Swab  Result Value Ref Range Status   SARS Coronavirus 2 NEGATIVE NEGATIVE Final    Comment: (NOTE) SARS-CoV-2 target nucleic acids are NOT DETECTED. The SARS-CoV-2 RNA is generally detectable in upper and lower respiratory specimens during the acute phase of infection. Negative results do not preclude SARS-CoV-2 infection, do not rule out co-infections with other pathogens, and should not be used as the sole basis for treatment or other patient management decisions. Negative results must be combined with clinical observations, patient history, and epidemiological information. The expected result is Negative. Fact Sheet for Patients: SugarRoll.be Fact Sheet for Healthcare Providers: https://www.woods-mathews.com/ This test is not yet approved or cleared by the [place] FDA and  has been authorized for detection and/or diagnosis of SARS-CoV-2 by  FDA under an Emergency Use Authorization (EUA). This EUA will remain  in effect (meaning this test can be used) for the duration of the COVID-19 declaration under Section 56 4(b)(1) of the Act, 21 U.S.C. section 360bbb-3(b)(1), unless the authorization is terminated or revoked sooner. Performed at [facility], [place] [address] [place], [place] [address]     Coagulation Studies: No results for input(s): LABPROT, INR in the last 72 hours.  Urinalysis: Recent Labs    01/16/20 1755  COLORURINE STRAW*  LABSPEC 1.010  PHURINE 5.0   GLUCOSEU NEGATIVE  HGBUR NEGATIVE  BILIRUBINUR NEGATIVE  KETONESUR NEGATIVE  PROTEINUR NEGATIVE  NITRITE NEGATIVE  LEUKOCYTESUR MODERATE*      Imaging: US Renal  Result Date: 01/16/2020 CLINICAL DATA:  Acute on chronic renal insufficiency EXAM: RENAL / URINARY TRACT ULTRASOUND COMPLETE COMPARISON:  10/11/2019 FINDINGS: Right Kidney: Renal measurements: 8.8 x 3.8 by 4.3 cm = volume: 75 mL. Mild increased renal cortical echotexture. No focal abnormalities. Left Kidney: Renal measurements: 10.5 x 4.1 x 5.1 cm = volume: 113 mL. No focal abnormalities mild increased renal cortical echotexture. Bladder: Appears normal for degree of bladder distention. Other: None. IMPRESSION: 1. Mild increased renal cortical echotexture consistent with medical renal disease. 2. Otherwise unremarkable renal ultrasound. Electronically Signed   By: Randa Ngo M.D.   On: 01/16/2020 16:40   ECHOCARDIOGRAM COMPLETE  Result Date: 01/18/2020    ECHOCARDIOGRAM REPORT   Patient Name:   BEVA REMUND Date of Exam: 01/18/2020 Medical Rec #:  [id number]      Height:       57.0 in Accession #:    [id number]     Weight:       148.6 lb Date of Birth:  12-Sep-1966      BSA:          1.585 m Patient Age:    54 years       BP:           116/77 mmHg Patient Gender: F              HR:           85 bpm. Exam Location:  [facility] Procedure: 2D Echo, Cardiac Doppler and Color Doppler Indications:     CHF 428.0  History:         Patient has prior history of Echocardiogram examinations, most                  recent 02/09/2018. CHF; Risk Factors:Hypertension. CKD.  Sonographer:     Sherrie Sport RDCS (AE) Referring Phys:  [id number] Alisa Graff Diagnosing Phys: Bartholome Bill MD  Sonographer Comments: Image acquisition challenging due to mastectomy. IMPRESSIONS  1. Left ventricular ejection fraction, by estimation, is 70 to 75%. The left ventricle has hyperdynamic function. The left ventricle has no regional wall motion abnormalities. Left ventricular  diastolic parameters were normal.  2. Right ventricular systolic function is normal. The right ventricular size is mildly enlarged. There is normal pulmonary artery systolic pressure.  3. The mitral valve is grossly normal. Mild mitral valve regurgitation.  4. The aortic valve was not well visualized. Aortic valve regurgitation is mild. Mild to moderate aortic valve sclerosis/calcification is present, without any evidence of aortic stenosis. FINDINGS  Left Ventricle: Left ventricular ejection fraction, by estimation, is 70 to 75%. The left ventricle has hyperdynamic function. The left ventricle has no regional wall motion abnormalities. The left ventricular internal cavity size was normal in size.  There is borderline left ventricular hypertrophy. Left ventricular diastolic parameters were normal. Right Ventricle: The right ventricular size is mildly enlarged. No increase in right ventricular wall thickness. Right ventricular systolic function is normal. There is normal pulmonary artery systolic pressure. The tricuspid regurgitant velocity is 1.97  m/s, and with an assumed right atrial pressure of 10 mmHg, the estimated right ventricular systolic pressure is 03.5 mmHg. Left Atrium: Left atrial size was normal in size. Right Atrium: Right atrial size was normal in size. Pericardium: There is no evidence of pericardial effusion. Mitral Valve: The mitral valve is grossly normal. Mild mitral valve regurgitation. Tricuspid Valve: The tricuspid valve is not well visualized. Tricuspid valve regurgitation is mild. Aortic Valve: The aortic valve was not well visualized. Aortic valve regurgitation is mild. Mild to moderate aortic valve sclerosis/calcification is present, without any evidence of aortic stenosis. Aortic valve mean gradient measures 4.0 mmHg. Aortic valve peak gradient measures 8.5 mmHg. Aortic valve area, by VTI measures 1.96 cm. Pulmonic Valve: The pulmonic valve was not well visualized. Pulmonic valve  regurgitation is not visualized. Aorta: The aortic root is normal in size and structure. IAS/Shunts: The interatrial septum was not assessed.  LEFT VENTRICLE PLAX 2D LVIDd:         4.02 cm  Diastology LVIDs:         2.33 cm  LV e' lateral:   5.55 cm/s LV PW:         0.97 cm  LV E/e' lateral: 12.3 LV IVS:        0.67 cm  LV e' medial:    3.48 cm/s LVOT diam:     2.00 cm  LV E/e' medial:  19.7 LV SV:         45 LV SV Index:   29 LVOT Area:     3.14 cm  RIGHT VENTRICLE RV Basal diam:  3.59 cm RV S prime:     11.30 cm/s TAPSE (M-mode): 3.8 cm LEFT ATRIUM             Index       RIGHT ATRIUM           Index LA diam:        2.50 cm 1.58 cm/m  RA Area:     15.30 cm LA Vol (A2C):   52.5 ml 33.12 ml/m RA Volume:   41.30 ml  26.05 ml/m LA Vol (A4C):   25.5 ml 16.09 ml/m LA Biplane Vol: 38.3 ml 24.16 ml/m  AORTIC VALVE                   PULMONIC VALVE AV Area (Vmax):    1.69 cm    PV Vmax:        0.53 m/s AV Area (Vmean):   1.79 cm    PV Peak grad:   1.1 mmHg AV Area (VTI):     1.96 cm    RVOT Peak grad: 2 mmHg AV Vmax:           145.67 cm/s AV Vmean:          92.933 cm/s AV VTI:            0.231 m AV Peak Grad:      8.5 mmHg AV Mean Grad:      4.0 mmHg LVOT Vmax:         78.30 cm/s LVOT Vmean:        52.900 cm/s LVOT VTI:  0.144 m LVOT/AV VTI ratio: 0.62  AORTA Ao Root diam: 2.80 cm MITRAL VALVE               TRICUSPID VALVE MV Area (PHT): 5.13 cm    TR Peak grad:   15.5 mmHg MV Decel Time: 148 msec    TR Vmax:        197.00 cm/s MV E velocity: 68.40 cm/s MV A velocity: 94.10 cm/s  SHUNTS MV E/A ratio:  0.73        Systemic VTI:  0.14 m                            Systemic Diam: 2.00 cm Bartholome Bill MD Electronically signed by Bartholome Bill MD Signature Date/Time: 01/18/2020/12:32:51 PM    Final      Medications:   . cefTRIAXone (ROCEPHIN)  IV 1 g (01/18/20 0930)   . sodium chloride   Intravenous Once  . cholecalciferol  1,000 Units Oral Daily  . cholestyramine  4 g Oral Daily  . clobetasol cream  1  application Topical BID  . heparin injection (subcutaneous)  5,000 Units Subcutaneous Q8H  . hydroxychloroquine  200 mg Oral Daily  . letrozole  2.5 mg Oral Daily  . metoprolol succinate  50 mg Oral Daily  . mycophenolate  500 mg Oral BID  . potassium chloride  10 mEq Oral Daily   acetaminophen **OR** acetaminophen, alum & mag hydroxide-simeth, ondansetron **OR** ondansetron (ZOFRAN) IV  Assessment/ Plan:  54 y.o. female with past medical history of left breast cancer, congestive heart failure with ejection fraction of 25 to 30%, chronic kidney disease stage IIIb, hypertension, lupus nephritis, obesity, vitamin D deficiency who presented with acute kidney injury related to nausea and vomiting.  1.  Acute kidney injury/chronic kidney disease stage IIIb/history of lupus nephritis.  Suspect acute kidney injury related to prolonged nausea and vomiting from chemotherapy.   -Renal function improved significantly.  BUN down to 74 with a creatinine of 2.68.  Encourage the patient to a continue with p.o. hydration at home.  We did advise the patient that if she developed nausea and vomiting then to come back to the hospital.  She verbalized understanding of this.  We will set up lab follow-up for the patient in our office next week.  2.  Hypertension.  Continue metoprolol upon discharge.   LOS: 2 Melanie Holloway 4/1/20212:06 PM

## 2020-01-19 ENCOUNTER — Other Ambulatory Visit: Payer: Self-pay | Admitting: Oncology

## 2020-02-06 ENCOUNTER — Ambulatory Visit (HOSPITAL_COMMUNITY)
Admission: RE | Admit: 2020-02-06 | Discharge: 2020-02-06 | Disposition: A | Discharge status: Home / Self Care | Payer: 59 | Source: Ambulatory Visit | Attending: Oncology | Admitting: Oncology

## 2020-02-06 ENCOUNTER — Other Ambulatory Visit: Payer: Self-pay

## 2020-02-06 ENCOUNTER — Encounter (HOSPITAL_COMMUNITY): Payer: Self-pay

## 2020-02-06 DIAGNOSIS — D561 Beta thalassemia: Secondary | ICD-10-CM | POA: Insufficient documentation

## 2020-02-06 DIAGNOSIS — M328 Other forms of systemic lupus erythematosus: Secondary | ICD-10-CM | POA: Insufficient documentation

## 2020-02-06 DIAGNOSIS — Z7189 Other specified counseling: Secondary | ICD-10-CM | POA: Insufficient documentation

## 2020-02-06 DIAGNOSIS — Z170 Estrogen receptor positive status [ER+]: Secondary | ICD-10-CM | POA: Diagnosis present

## 2020-02-06 DIAGNOSIS — C50512 Malignant neoplasm of lower-outer quadrant of left female breast: Secondary | ICD-10-CM | POA: Insufficient documentation

## 2020-02-06 DIAGNOSIS — C7951 Secondary malignant neoplasm of bone: Secondary | ICD-10-CM | POA: Diagnosis present

## 2020-02-06 DIAGNOSIS — D61818 Other pancytopenia: Secondary | ICD-10-CM

## 2020-02-06 DIAGNOSIS — M3214 Glomerular disease in systemic lupus erythematosus: Secondary | ICD-10-CM | POA: Insufficient documentation

## 2020-02-06 DIAGNOSIS — C771 Secondary and unspecified malignant neoplasm of intrathoracic lymph nodes: Secondary | ICD-10-CM | POA: Diagnosis present

## 2020-02-07 ENCOUNTER — Encounter: Payer: Self-pay | Admitting: Oncology

## 2020-02-12 NOTE — Progress Notes (Signed)
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 lesions, consistent to respond to the alendronate, no new bone lesions, no definitive visceral disease  (31) fracture of left ankle with subsequent strain  (a) left lower extremity Doppler 10/24/2019 no DVT   PLAN:  CTrynityis now 8 months out from definitive diagnosis of metastatic breast cancer, chiefly involving bone.  Disease is stable.  She is tolerating treatment well.  The plan is to continue the fulvestrant every 4 weeks, the alendronate weekly, and the letrozole daily.  We have not been able to add a CDK inhibitor at this point but given her very poor tolerance of abemaciclib and her evidence of response on the recent CT scan we are going to continue treatment as current and reassess again in approximately 4 months  Quite aside from the breast cancer issue CSherryannhas lupus which complicates assessment.  It is frequently difficult to know whether her symptoms are due to cancer or to the lupus problem.  We have started her on erythropoietin supplementation given the severe renal insufficiency and anemia.  I offered her a transfusion today and she is interested.  We will try to get that accomplished later  this week.  We are going to see her on a monthly basis for closer monitoring.  We have encouraged her to call [place] any other problems that may develop before the next visit here  Total encounter time 40 minutes.*Chauncey Cruel MD Medical Oncology and Hematology [facility] [id number]. [phone]   Fax. [phone] Addendum: Todd's creatinine has jumped up to greater than 5.  [place].  We are contacting them to let them know that she will be coming.   I, KWilburn Mylar am acting as scribe for Dr. GSarajane JewsC. Freja Faro.  I, GLurline DelMD, have reviewed the above documentation for accuracy and completeness, and I agree with the above.   *Total Encounter Time as defined by the Centers for Medicare and Medicaid Services includes, in addition to the face-to-face time of a patient visit (documented in the note above) non-face-to-face time: obtaining and reviewing outside history, ordering and reviewing medications, tests or procedures, care coordination (communications  with other health care professionals or caregivers) and documentation in the medical record.

## 2020-02-13 ENCOUNTER — Ambulatory Visit: Payer: No Typology Code available for payment source

## 2020-02-13 ENCOUNTER — Other Ambulatory Visit: Payer: Self-pay

## 2020-02-13 ENCOUNTER — Inpatient Hospital Stay: Payer: No Typology Code available for payment source | Attending: Oncology

## 2020-02-13 ENCOUNTER — Inpatient Hospital Stay (HOSPITAL_BASED_OUTPATIENT_CLINIC_OR_DEPARTMENT_OTHER): Payer: No Typology Code available for payment source | Admitting: Oncology

## 2020-02-13 ENCOUNTER — Other Ambulatory Visit: Payer: Self-pay | Admitting: *Deleted

## 2020-02-13 ENCOUNTER — Inpatient Hospital Stay: Payer: No Typology Code available for payment source

## 2020-02-13 VITALS — BP 127/81 | HR 92 | Temp 98.5°F | Resp 18 | Ht <= 58 in | Wt 154.8 lb

## 2020-02-13 DIAGNOSIS — M3214 Glomerular disease in systemic lupus erythematosus: Secondary | ICD-10-CM | POA: Insufficient documentation

## 2020-02-13 DIAGNOSIS — D61818 Other pancytopenia: Secondary | ICD-10-CM

## 2020-02-13 DIAGNOSIS — Z5111 Encounter for antineoplastic chemotherapy: Secondary | ICD-10-CM | POA: Diagnosis present

## 2020-02-13 DIAGNOSIS — I700 Atherosclerosis of aorta: Secondary | ICD-10-CM | POA: Diagnosis not present

## 2020-02-13 DIAGNOSIS — D649 Anemia, unspecified: Secondary | ICD-10-CM | POA: Insufficient documentation

## 2020-02-13 DIAGNOSIS — Z803 Family history of malignant neoplasm of breast: Secondary | ICD-10-CM | POA: Insufficient documentation

## 2020-02-13 DIAGNOSIS — S82892D Other fracture of left lower leg, subsequent encounter for closed fracture with routine healing: Secondary | ICD-10-CM | POA: Diagnosis not present

## 2020-02-13 DIAGNOSIS — Z7189 Other specified counseling: Secondary | ICD-10-CM

## 2020-02-13 DIAGNOSIS — Z923 Personal history of irradiation: Secondary | ICD-10-CM | POA: Diagnosis not present

## 2020-02-13 DIAGNOSIS — C771 Secondary and unspecified malignant neoplasm of intrathoracic lymph nodes: Secondary | ICD-10-CM

## 2020-02-13 DIAGNOSIS — C7951 Secondary malignant neoplasm of bone: Secondary | ICD-10-CM

## 2020-02-13 DIAGNOSIS — K8020 Calculus of gallbladder without cholecystitis without obstruction: Secondary | ICD-10-CM | POA: Diagnosis not present

## 2020-02-13 DIAGNOSIS — I2510 Atherosclerotic heart disease of native coronary artery without angina pectoris: Secondary | ICD-10-CM | POA: Diagnosis not present

## 2020-02-13 DIAGNOSIS — D561 Beta thalassemia: Secondary | ICD-10-CM

## 2020-02-13 DIAGNOSIS — C50512 Malignant neoplasm of lower-outer quadrant of left female breast: Secondary | ICD-10-CM

## 2020-02-13 DIAGNOSIS — Z809 Family history of malignant neoplasm, unspecified: Secondary | ICD-10-CM | POA: Insufficient documentation

## 2020-02-13 DIAGNOSIS — Z170 Estrogen receptor positive status [ER+]: Secondary | ICD-10-CM | POA: Diagnosis not present

## 2020-02-13 DIAGNOSIS — Z9012 Acquired absence of left breast and nipple: Secondary | ICD-10-CM | POA: Insufficient documentation

## 2020-02-13 DIAGNOSIS — J479 Bronchiectasis, uncomplicated: Secondary | ICD-10-CM | POA: Insufficient documentation

## 2020-02-13 DIAGNOSIS — M329 Systemic lupus erythematosus, unspecified: Secondary | ICD-10-CM

## 2020-02-13 DIAGNOSIS — Z9221 Personal history of antineoplastic chemotherapy: Secondary | ICD-10-CM | POA: Insufficient documentation

## 2020-02-13 DIAGNOSIS — C50012 Malignant neoplasm of nipple and areola, left female breast: Secondary | ICD-10-CM

## 2020-02-13 DIAGNOSIS — Z79811 Long term (current) use of aromatase inhibitors: Secondary | ICD-10-CM | POA: Insufficient documentation

## 2020-02-13 DIAGNOSIS — C50011 Malignant neoplasm of nipple and areola, right female breast: Secondary | ICD-10-CM

## 2020-02-13 DIAGNOSIS — N189 Chronic kidney disease, unspecified: Secondary | ICD-10-CM

## 2020-02-13 DIAGNOSIS — M328 Other forms of systemic lupus erythematosus: Secondary | ICD-10-CM

## 2020-02-13 DIAGNOSIS — N179 Acute kidney failure, unspecified: Secondary | ICD-10-CM

## 2020-02-13 LAB — CBC WITH DIFFERENTIAL/PLATELET
Abs Immature Granulocytes: 0.03 10*3/uL (ref 0.00–0.07)
Basophils Absolute: 0 10*3/uL (ref 0.0–0.1)
Basophils Relative: 0 %
Eosinophils Absolute: 0.1 10*3/uL (ref 0.0–0.5)
Eosinophils Relative: 1 %
HCT: 21.6 % — ABNORMAL LOW (ref 36.0–46.0)
Hemoglobin: 7 g/dL — ABNORMAL LOW (ref 12.0–15.0)
Immature Granulocytes: 1 %
Lymphocytes Relative: 13 %
Lymphs Abs: 0.6 10*3/uL — ABNORMAL LOW (ref 0.7–4.0)
MCH: 29 pg (ref 26.0–34.0)
MCHC: 32.4 g/dL (ref 30.0–36.0)
MCV: 89.6 fL (ref 80.0–100.0)
Monocytes Absolute: 0.5 10*3/uL (ref 0.1–1.0)
Monocytes Relative: 10 %
Neutro Abs: 3.6 10*3/uL (ref 1.7–7.7)
Neutrophils Relative %: 75 %
Platelets: 178 10*3/uL (ref 150–400)
RBC: 2.41 MIL/uL — ABNORMAL LOW (ref 3.87–5.11)
RDW: 13.7 % (ref 11.5–15.5)
WBC: 4.8 10*3/uL (ref 4.0–10.5)
nRBC: 0 % (ref 0.0–0.2)

## 2020-02-13 LAB — COMPREHENSIVE METABOLIC PANEL
ALT: 11 U/L (ref 0–44)
AST: 14 U/L — ABNORMAL LOW (ref 15–41)
Albumin: 3.5 g/dL (ref 3.5–5.0)
Alkaline Phosphatase: 65 U/L (ref 38–126)
Anion gap: 12 (ref 5–15)
BUN: 75 mg/dL — ABNORMAL HIGH (ref 6–20)
CO2: 24 mmol/L (ref 22–32)
Calcium: 9 mg/dL (ref 8.9–10.3)
Chloride: 106 mmol/L (ref 98–111)
Creatinine, Ser: 2.29 mg/dL — ABNORMAL HIGH (ref 0.44–1.00)
GFR calc Af Amer: 27 mL/min — ABNORMAL LOW (ref >60)
GFR calc non Af Amer: 23 mL/min — ABNORMAL LOW (ref >60)
Glucose, Bld: 90 mg/dL (ref 70–99)
Potassium: 3.9 mmol/L (ref 3.5–5.1)
Sodium: 142 mmol/L (ref 135–145)
Total Bilirubin: 0.2 mg/dL — ABNORMAL LOW (ref 0.3–1.2)
Total Protein: 7.1 g/dL (ref 6.5–8.1)

## 2020-02-13 MED ORDER — DARBEPOETIN ALFA 200 MCG/0.4ML IJ SOSY
200.0000 ug | PREFILLED_SYRINGE | Freq: Once | INTRAMUSCULAR | Status: AC
  Administered 2020-02-13: 200 ug via SUBCUTANEOUS

## 2020-02-13 MED ORDER — FULVESTRANT 250 MG/5ML IM SOLN
500.0000 mg | Freq: Once | INTRAMUSCULAR | Status: AC
  Administered 2020-02-13: 500 mg via INTRAMUSCULAR

## 2020-02-13 MED ORDER — FULVESTRANT 250 MG/5ML IM SOLN
INTRAMUSCULAR | Status: AC
  Filled 2020-02-13: qty 10

## 2020-02-13 MED ORDER — DARBEPOETIN ALFA 200 MCG/0.4ML IJ SOSY
PREFILLED_SYRINGE | INTRAMUSCULAR | Status: AC
  Filled 2020-02-13: qty 0.4

## 2020-02-13 NOTE — Patient Instructions (Signed)
Darbepoetin Alfa injection What is this medicine? DARBEPOETIN ALFA (dar be POE e tin AL fa) helps your body make more red blood cells. It is used to treat anemia caused by chronic kidney failure and chemotherapy. This medicine may be used for other purposes; ask your health care provider or pharmacist if you have questions. COMMON BRAND NAME(S): Aranesp What should I tell my health care provider before I take this medicine? They need to know if you have any of these conditions:  blood clotting disorders or history of blood clots  cancer patient not on chemotherapy  cystic fibrosis  heart disease, such as angina, heart failure, or a history of a heart attack  hemoglobin level of 12 g/dL or greater  high blood pressure  low levels of folate, iron, or vitamin B12  seizures  an unusual or allergic reaction to darbepoetin, erythropoietin, albumin, hamster proteins, latex, other medicines, foods, dyes, or preservatives  pregnant or trying to get pregnant  breast-feeding How should I use this medicine? This medicine is for injection into a vein or under the skin. It is usually given by a health care professional in a hospital or clinic setting. If you get this medicine at home, you will be taught how to prepare and give this medicine. Use exactly as directed. Take your medicine at regular intervals. Do not take your medicine more often than directed. It is important that you put your used needles and syringes in a special sharps container. Do not put them in a trash can. If you do not have a sharps container, call your pharmacist or healthcare provider to get one. A special MedGuide will be given to you by the pharmacist with each prescription and refill. Be sure to read this information carefully each time. Talk to your pediatrician regarding the use of this medicine in children. While this medicine may be used in children as young as 17 month of age for selected conditions, precautions  do apply. Overdosage: If you think you have taken too much of this medicine contact a poison control center or emergency room at once. NOTE: This medicine is only for you. Do not share this medicine with others. What if I miss a dose? If you miss a dose, take it as soon as you can. If it is almost time for your next dose, take only that dose. Do not take double or extra doses. What may interact with this medicine? Do not take this medicine with any of the following medications:  epoetin alfa This list may not describe all possible interactions. Give your health care provider a list of all the medicines, herbs, non-prescription drugs, or dietary supplements you use. Also tell them if you smoke, drink alcohol, or use illegal drugs. Some items may interact with your medicine. What should I watch for while using this medicine? Your condition will be monitored carefully while you are receiving this medicine. You may need blood work done while you are taking this medicine. This medicine may cause a decrease in vitamin B6. You should make sure that you get enough vitamin B6 while you are taking this medicine. Discuss the foods you eat and the vitamins you take with your health care professional. What side effects may I notice from receiving this medicine? Side effects that you should report to your doctor or health care professional as soon as possible:  allergic reactions like skin rash, itching or hives, swelling of the face, lips, or tongue  breathing problems  changes in  vision  chest pain  confusion, trouble speaking or understanding  feeling faint or lightheaded, falls  high blood pressure  muscle aches or pains  pain, swelling, warmth in the leg  rapid weight gain  severe headaches  sudden numbness or weakness of the face, arm or leg  trouble walking, dizziness, loss of balance or coordination  seizures (convulsions)  swelling of the ankles, feet, hands  unusually weak  or tired Side effects that usually do not require medical attention (report to your doctor or health care professional if they continue or are bothersome):  diarrhea  fever, chills (flu-like symptoms)  headaches  nausea, vomiting  redness, stinging, or swelling at site where injected This list may not describe all possible side effects. Call your doctor for medical advice about side effects. You may report side effects to FDA at 1-800-FDA-1088. Where should I keep my medicine? Keep out of the reach of children. Store in a refrigerator between 2 and 8 degrees C (36 and 46 degrees F). Do not freeze. Do not shake. Throw away any unused portion if using a single-dose vial. Throw away any unused medicine after the expiration date. NOTE: This sheet is a summary. It may not cover all possible information. If you have questions about this medicine, talk to your doctor, pharmacist, or health care provider.  2020 Elsevier/Gold Standard (2017-10-20 16:44:20) Fulvestrant injection What is this medicine? FULVESTRANT (ful VES trant) blocks the effects of estrogen. It is used to treat breast cancer. This medicine may be used for other purposes; ask your health care provider or pharmacist if you have questions. COMMON BRAND NAME(S): FASLODEX What should I tell my health care provider before I take this medicine? They need to know if you have any of these conditions:  bleeding disorders  liver disease  low blood counts, like low white cell, platelet, or red cell counts  an unusual or allergic reaction to fulvestrant, other medicines, foods, dyes, or preservatives  pregnant or trying to get pregnant  breast-feeding How should I use this medicine? This medicine is for injection into a muscle. It is usually given by a health care professional in a hospital or clinic setting. Talk to your pediatrician regarding the use of this medicine in children. Special care may be needed. Overdosage: If you  think you have taken too much of this medicine contact a poison control center or emergency room at once. NOTE: This medicine is only for you. Do not share this medicine with others. What if I miss a dose? It is important not to miss your dose. Call your doctor or health care professional if you are unable to keep an appointment. What may interact with this medicine?  medicines that treat or prevent blood clots like warfarin, enoxaparin, dalteparin, apixaban, dabigatran, and rivaroxaban This list may not describe all possible interactions. Give your health care provider a list of all the medicines, herbs, non-prescription drugs, or dietary supplements you use. Also tell them if you smoke, drink alcohol, or use illegal drugs. Some items may interact with your medicine. What should I watch for while using this medicine? Your condition will be monitored carefully while you are receiving this medicine. You will need important blood work done while you are taking this medicine. Do not become pregnant while taking this medicine or for at least 1 year after stopping it. Women of child-bearing potential will need to have a negative pregnancy test before starting this medicine. Women should inform their doctor if they wish  to become pregnant or think they might be pregnant. There is a potential for serious side effects to an unborn child. Men should inform their doctors if they wish to father a child. This medicine may lower sperm counts. Talk to your health care professional or pharmacist for more information. Do not breast-feed an infant while taking this medicine or for 1 year after the last dose. What side effects may I notice from receiving this medicine? Side effects that you should report to your doctor or health care professional as soon as possible:  allergic reactions like skin rash, itching or hives, swelling of the face, lips, or tongue  feeling faint or lightheaded, falls  pain, tingling,  numbness, or weakness in the legs  signs and symptoms of infection like fever or chills; cough; flu-like symptoms; sore throat  vaginal bleeding Side effects that usually do not require medical attention (report to your doctor or health care professional if they continue or are bothersome):  aches, pains  constipation  diarrhea  headache  hot flashes  nausea, vomiting  pain at site where injected  stomach pain This list may not describe all possible side effects. Call your doctor for medical advice about side effects. You may report side effects to FDA at 1-800-FDA-1088. Where should I keep my medicine? This drug is given in a hospital or clinic and will not be stored at home. NOTE: This sheet is a summary. It may not cover all possible information. If you have questions about this medicine, talk to your doctor, pharmacist, or health care provider.  2020 Elsevier/Gold Standard (2018-01-13 11:34:41)

## 2020-02-14 ENCOUNTER — Telehealth: Payer: Self-pay | Admitting: Oncology

## 2020-02-14 LAB — CANCER ANTIGEN 27.29: CA 27.29: 48.5 U/mL — ABNORMAL HIGH (ref 0.0–38.6)

## 2020-02-14 NOTE — Telephone Encounter (Signed)
Scheduled appt per phone conversation with RN Val. Pt confirmed appt on 4/29.

## 2020-02-14 NOTE — Telephone Encounter (Signed)
Scheduled appts per 4/27 los. Pt confirmed appt date and time.  

## 2020-02-15 ENCOUNTER — Other Ambulatory Visit: Payer: Self-pay | Admitting: *Deleted

## 2020-02-15 ENCOUNTER — Other Ambulatory Visit: Payer: Self-pay

## 2020-02-15 ENCOUNTER — Inpatient Hospital Stay: Payer: No Typology Code available for payment source

## 2020-02-15 DIAGNOSIS — D649 Anemia, unspecified: Secondary | ICD-10-CM | POA: Diagnosis not present

## 2020-02-15 DIAGNOSIS — Z170 Estrogen receptor positive status [ER+]: Secondary | ICD-10-CM

## 2020-02-15 DIAGNOSIS — C50512 Malignant neoplasm of lower-outer quadrant of left female breast: Secondary | ICD-10-CM

## 2020-02-15 DIAGNOSIS — M3214 Glomerular disease in systemic lupus erythematosus: Secondary | ICD-10-CM

## 2020-02-15 DIAGNOSIS — M328 Other forms of systemic lupus erythematosus: Secondary | ICD-10-CM

## 2020-02-15 DIAGNOSIS — D561 Beta thalassemia: Secondary | ICD-10-CM

## 2020-02-15 DIAGNOSIS — C7951 Secondary malignant neoplasm of bone: Secondary | ICD-10-CM

## 2020-02-15 DIAGNOSIS — D61818 Other pancytopenia: Secondary | ICD-10-CM

## 2020-02-15 DIAGNOSIS — C771 Secondary and unspecified malignant neoplasm of intrathoracic lymph nodes: Secondary | ICD-10-CM

## 2020-02-15 DIAGNOSIS — Z7189 Other specified counseling: Secondary | ICD-10-CM

## 2020-02-15 LAB — PREPARE RBC (CROSSMATCH)

## 2020-02-15 MED ORDER — SODIUM CHLORIDE 0.9% IV SOLUTION
250.0000 mL | Freq: Once | INTRAVENOUS | Status: DC
  Filled 2020-02-15: qty 250

## 2020-02-15 NOTE — Patient Instructions (Signed)

## 2020-02-17 LAB — TYPE AND SCREEN
ABO/RH(D): B POS
Antibody Screen: NEGATIVE
Unit division: 0

## 2020-02-17 LAB — BPAM RBC
Blood Product Expiration Date: 202105222359
ISSUE DATE / TIME: 202104290846
Unit Type and Rh: 7300

## 2020-02-23 ENCOUNTER — Telehealth: Payer: Self-pay | Admitting: Adult Health

## 2020-02-23 NOTE — Telephone Encounter (Signed)
Rescheduled appts per provider PAL day. Pt confirmed new appt time.

## 2020-03-07 ENCOUNTER — Other Ambulatory Visit (INDEPENDENT_AMBULATORY_CARE_PROVIDER_SITE_OTHER): Payer: 59

## 2020-03-07 DIAGNOSIS — Z1211 Encounter for screening for malignant neoplasm of colon: Secondary | ICD-10-CM | POA: Diagnosis not present

## 2020-03-07 LAB — FECAL OCCULT BLOOD, GUAIAC: Fecal Occult Blood: NEGATIVE

## 2020-03-07 LAB — FECAL OCCULT BLOOD, IMMUNOCHEMICAL: Fecal Occult Bld: NEGATIVE

## 2020-03-11 ENCOUNTER — Encounter: Payer: Self-pay | Admitting: Family Medicine
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
## 2020-03-12 ENCOUNTER — Other Ambulatory Visit: Payer: No Typology Code available for payment source

## 2020-03-12 ENCOUNTER — Inpatient Hospital Stay: Payer: No Typology Code available for payment source | Attending: Oncology

## 2020-03-12 ENCOUNTER — Ambulatory Visit: Payer: No Typology Code available for payment source

## 2020-03-12 ENCOUNTER — Inpatient Hospital Stay (HOSPITAL_BASED_OUTPATIENT_CLINIC_OR_DEPARTMENT_OTHER): Payer: No Typology Code available for payment source | Admitting: Nurse Practitioner

## 2020-03-12 ENCOUNTER — Inpatient Hospital Stay: Payer: No Typology Code available for payment source

## 2020-03-12 ENCOUNTER — Other Ambulatory Visit: Payer: Self-pay

## 2020-03-12 ENCOUNTER — Encounter: Payer: Self-pay | Admitting: Nurse Practitioner

## 2020-03-12 ENCOUNTER — Ambulatory Visit: Payer: 59 | Admitting: Adult Health

## 2020-03-12 VITALS — BP 137/91 | HR 84 | Temp 98.7°F | Resp 18 | Wt 157.3 lb

## 2020-03-12 DIAGNOSIS — Z9012 Acquired absence of left breast and nipple: Secondary | ICD-10-CM | POA: Insufficient documentation

## 2020-03-12 DIAGNOSIS — C50512 Malignant neoplasm of lower-outer quadrant of left female breast: Secondary | ICD-10-CM

## 2020-03-12 DIAGNOSIS — C7951 Secondary malignant neoplasm of bone: Secondary | ICD-10-CM | POA: Insufficient documentation

## 2020-03-12 DIAGNOSIS — Z79811 Long term (current) use of aromatase inhibitors: Secondary | ICD-10-CM | POA: Diagnosis not present

## 2020-03-12 DIAGNOSIS — Z809 Family history of malignant neoplasm, unspecified: Secondary | ICD-10-CM | POA: Insufficient documentation

## 2020-03-12 DIAGNOSIS — Z170 Estrogen receptor positive status [ER+]: Secondary | ICD-10-CM | POA: Diagnosis not present

## 2020-03-12 DIAGNOSIS — C50012 Malignant neoplasm of nipple and areola, left female breast: Secondary | ICD-10-CM

## 2020-03-12 DIAGNOSIS — C771 Secondary and unspecified malignant neoplasm of intrathoracic lymph nodes: Secondary | ICD-10-CM

## 2020-03-12 DIAGNOSIS — I130 Hypertensive heart and chronic kidney disease with heart failure and stage 1 through stage 4 chronic kidney disease, or unspecified chronic kidney disease: Secondary | ICD-10-CM | POA: Insufficient documentation

## 2020-03-12 DIAGNOSIS — D561 Beta thalassemia: Secondary | ICD-10-CM

## 2020-03-12 DIAGNOSIS — N179 Acute kidney failure, unspecified: Secondary | ICD-10-CM

## 2020-03-12 DIAGNOSIS — D61818 Other pancytopenia: Secondary | ICD-10-CM

## 2020-03-12 DIAGNOSIS — I700 Atherosclerosis of aorta: Secondary | ICD-10-CM

## 2020-03-12 DIAGNOSIS — Z5111 Encounter for antineoplastic chemotherapy: Secondary | ICD-10-CM | POA: Diagnosis not present

## 2020-03-12 DIAGNOSIS — Z923 Personal history of irradiation: Secondary | ICD-10-CM | POA: Insufficient documentation

## 2020-03-12 DIAGNOSIS — M3214 Glomerular disease in systemic lupus erythematosus: Secondary | ICD-10-CM | POA: Diagnosis not present

## 2020-03-12 DIAGNOSIS — I2510 Atherosclerotic heart disease of native coronary artery without angina pectoris: Secondary | ICD-10-CM | POA: Diagnosis not present

## 2020-03-12 DIAGNOSIS — D649 Anemia, unspecified: Secondary | ICD-10-CM | POA: Diagnosis present

## 2020-03-12 DIAGNOSIS — N189 Chronic kidney disease, unspecified: Secondary | ICD-10-CM | POA: Diagnosis not present

## 2020-03-12 DIAGNOSIS — Z803 Family history of malignant neoplasm of breast: Secondary | ICD-10-CM | POA: Insufficient documentation

## 2020-03-12 DIAGNOSIS — M329 Systemic lupus erythematosus, unspecified: Secondary | ICD-10-CM

## 2020-03-12 LAB — RETICULOCYTES
Immature Retic Fract: 9.9 % (ref 2.3–15.9)
RBC.: 2.77 MIL/uL — ABNORMAL LOW (ref 3.87–5.11)
Retic Count, Absolute: 45.4 10*3/uL (ref 19.0–186.0)
Retic Ct Pct: 1.6 % (ref 0.4–3.1)

## 2020-03-12 LAB — COMPREHENSIVE METABOLIC PANEL
ALT: 11 U/L (ref 0–44)
AST: 18 U/L (ref 15–41)
Albumin: 3.5 g/dL (ref 3.5–5.0)
Alkaline Phosphatase: 74 U/L (ref 38–126)
Anion gap: 9 (ref 5–15)
BUN: 54 mg/dL — ABNORMAL HIGH (ref 6–20)
CO2: 25 mmol/L (ref 22–32)
Calcium: 8.4 mg/dL — ABNORMAL LOW (ref 8.9–10.3)
Chloride: 105 mmol/L (ref 98–111)
Creatinine, Ser: 1.92 mg/dL — ABNORMAL HIGH (ref 0.44–1.00)
GFR calc Af Amer: 33 mL/min — ABNORMAL LOW (ref >60)
GFR calc non Af Amer: 29 mL/min — ABNORMAL LOW (ref >60)
Glucose, Bld: 96 mg/dL (ref 70–99)
Potassium: 3.8 mmol/L (ref 3.5–5.1)
Sodium: 139 mmol/L (ref 135–145)
Total Bilirubin: 0.4 mg/dL (ref 0.3–1.2)
Total Protein: 7.1 g/dL (ref 6.5–8.1)

## 2020-03-12 LAB — CBC WITH DIFFERENTIAL/PLATELET
Abs Immature Granulocytes: 0.01 10*3/uL (ref 0.00–0.07)
Basophils Absolute: 0 10*3/uL (ref 0.0–0.1)
Basophils Relative: 0 %
Eosinophils Absolute: 0.1 10*3/uL (ref 0.0–0.5)
Eosinophils Relative: 2 %
HCT: 25.9 % — ABNORMAL LOW (ref 36.0–46.0)
Hemoglobin: 8.4 g/dL — ABNORMAL LOW (ref 12.0–15.0)
Immature Granulocytes: 0 %
Lymphocytes Relative: 17 %
Lymphs Abs: 0.6 10*3/uL — ABNORMAL LOW (ref 0.7–4.0)
MCH: 29.9 pg (ref 26.0–34.0)
MCHC: 32.4 g/dL (ref 30.0–36.0)
MCV: 92.2 fL (ref 80.0–100.0)
Monocytes Absolute: 0.2 10*3/uL (ref 0.1–1.0)
Monocytes Relative: 7 %
Neutro Abs: 2.7 10*3/uL (ref 1.7–7.7)
Neutrophils Relative %: 74 %
Platelets: 169 10*3/uL (ref 150–400)
RBC: 2.81 MIL/uL — ABNORMAL LOW (ref 3.87–5.11)
RDW: 13.2 % (ref 11.5–15.5)
WBC: 3.7 10*3/uL — ABNORMAL LOW (ref 4.0–10.5)
nRBC: 0 % (ref 0.0–0.2)

## 2020-03-12 LAB — IRON AND TIBC
Iron: 107 ug/dL (ref 41–142)
Saturation Ratios: 42 % (ref 21–57)
TIBC: 256 ug/dL (ref 236–444)
UIBC: 149 ug/dL (ref 120–384)

## 2020-03-12 LAB — FERRITIN: Ferritin: 1978 ng/mL — ABNORMAL HIGH (ref 11–307)

## 2020-03-12 MED ORDER — FULVESTRANT 250 MG/5ML IM SOLN
500.0000 mg | Freq: Once | INTRAMUSCULAR | Status: AC
  Administered 2020-03-12: 500 mg via INTRAMUSCULAR

## 2020-03-12 MED ORDER — FULVESTRANT 250 MG/5ML IM SOLN
INTRAMUSCULAR | Status: AC
  Filled 2020-03-12: qty 10

## 2020-03-12 MED ORDER — DARBEPOETIN ALFA 200 MCG/0.4ML IJ SOSY: 200.0000 ug | PREFILLED_SYRINGE | Freq: Once | INTRAMUSCULAR | Status: DC

## 2020-03-12 NOTE — Progress Notes (Signed)
Per Lacie, no Aranesp today. Fulvestrant only.  Demetrius Charity, PharmD, BCPS, [facility] Pharmacist Pharmacy Phone: [phone] 03/12/2020

## 2020-03-12 NOTE — Patient Instructions (Signed)
Fulvestrant injection What is this medicine? FULVESTRANT (ful VES trant) blocks the effects of estrogen. It is used to treat breast cancer. This medicine may be used for other purposes; ask your health care provider or pharmacist if you have questions. COMMON BRAND NAME(S): FASLODEX What should I tell my health care provider before I take this medicine? They need to know if you have any of these conditions:  bleeding disorders  liver disease  low blood counts, like low white cell, platelet, or red cell counts  an unusual or allergic reaction to fulvestrant, other medicines, foods, dyes, or preservatives  pregnant or trying to get pregnant  breast-feeding How should I use this medicine? This medicine is for injection into a muscle. It is usually given by a health care professional in a hospital or clinic setting. Talk to your pediatrician regarding the use of this medicine in children. Special care may be needed. Overdosage: If you think you have taken too much of this medicine contact a poison control center or emergency room at once. NOTE: This medicine is only for you. Do not share this medicine with others. What if I miss a dose? It is important not to miss your dose. Call your doctor or health care professional if you are unable to keep an appointment. What may interact with this medicine?  medicines that treat or prevent blood clots like warfarin, enoxaparin, dalteparin, apixaban, dabigatran, and rivaroxaban This list may not describe all possible interactions. Give your health care provider a list of all the medicines, herbs, non-prescription drugs, or dietary supplements you use. Also tell them if you smoke, drink alcohol, or use illegal drugs. Some items may interact with your medicine. What should I watch for while using this medicine? Your condition will be monitored carefully while you are receiving this medicine. You will need important blood work done while you are taking  this medicine. Do not become pregnant while taking this medicine or for at least 1 year after stopping it. Women of child-bearing potential will need to have a negative pregnancy test before starting this medicine. Women should inform their doctor if they wish to become pregnant or think they might be pregnant. There is a potential for serious side effects to an unborn child. Men should inform their doctors if they wish to father a child. This medicine may lower sperm counts. Talk to your health care professional or pharmacist for more information. Do not breast-feed an infant while taking this medicine or for 1 year after the last dose. What side effects may I notice from receiving this medicine? Side effects that you should report to your doctor or health care professional as soon as possible:  allergic reactions like skin rash, itching or hives, swelling of the face, lips, or tongue  feeling faint or lightheaded, falls  pain, tingling, numbness, or weakness in the legs  signs and symptoms of infection like fever or chills; cough; flu-like symptoms; sore throat  vaginal bleeding Side effects that usually do not require medical attention (report to your doctor or health care professional if they continue or are bothersome):  aches, pains  constipation  diarrhea  headache  hot flashes  nausea, vomiting  pain at site where injected  stomach pain This list may not describe all possible side effects. Call your doctor for medical advice about side effects. You may report side effects to FDA at 1-800-FDA-1088. Where should I keep my medicine? This drug is given in a hospital or clinic and will   not be stored at home. NOTE: This sheet is a summary. It may not cover all possible information. If you have questions about this medicine, talk to your doctor, pharmacist, or health care provider.  2020 Elsevier/Gold Standard (2018-01-13 11:34:41)  

## 2020-03-13 ENCOUNTER — Telehealth: Payer: Self-pay | Admitting: Nurse Practitioner

## 2020-03-13 LAB — CANCER ANTIGEN 27.29: CA 27.29: 46.4 U/mL — ABNORMAL HIGH (ref 0.0–38.6)

## 2020-03-13 NOTE — Telephone Encounter (Signed)
Scheduled appt per 5/25 los.  Spoke with pt and she is aware of her appt date and time.
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
## 2020-03-15 ENCOUNTER — Inpatient Hospital Stay (HOSPITAL_BASED_OUTPATIENT_CLINIC_OR_DEPARTMENT_OTHER): Payer: No Typology Code available for payment source | Admitting: Oncology

## 2020-03-15 DIAGNOSIS — C50011 Malignant neoplasm of nipple and areola, right female breast: Secondary | ICD-10-CM

## 2020-03-15 DIAGNOSIS — N179 Acute kidney failure, unspecified: Secondary | ICD-10-CM

## 2020-03-15 DIAGNOSIS — M3214 Glomerular disease in systemic lupus erythematosus: Secondary | ICD-10-CM

## 2020-03-15 DIAGNOSIS — C7951 Secondary malignant neoplasm of bone: Secondary | ICD-10-CM | POA: Diagnosis not present

## 2020-03-15 DIAGNOSIS — C771 Secondary and unspecified malignant neoplasm of intrathoracic lymph nodes: Secondary | ICD-10-CM

## 2020-03-15 DIAGNOSIS — D61818 Other pancytopenia: Secondary | ICD-10-CM

## 2020-03-15 DIAGNOSIS — N1832 Chronic kidney disease, stage 3b: Secondary | ICD-10-CM

## 2020-03-15 DIAGNOSIS — N189 Chronic kidney disease, unspecified: Secondary | ICD-10-CM

## 2020-03-15 DIAGNOSIS — N184 Chronic kidney disease, stage 4 (severe): Secondary | ICD-10-CM | POA: Insufficient documentation

## 2020-03-15 DIAGNOSIS — Z7189 Other specified counseling: Secondary | ICD-10-CM

## 2020-03-19 ENCOUNTER — Telehealth: Payer: Self-pay | Admitting: Adult Health

## 2020-03-19 NOTE — Telephone Encounter (Signed)
No 5/28 los. No changes made to pt's schedule.
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
## 2020-04-09 ENCOUNTER — Other Ambulatory Visit: Payer: Self-pay

## 2020-04-09 ENCOUNTER — Inpatient Hospital Stay (HOSPITAL_BASED_OUTPATIENT_CLINIC_OR_DEPARTMENT_OTHER): Payer: No Typology Code available for payment source | Admitting: Oncology

## 2020-04-09 ENCOUNTER — Inpatient Hospital Stay: Payer: No Typology Code available for payment source | Attending: Oncology

## 2020-04-09 ENCOUNTER — Other Ambulatory Visit: Payer: Self-pay | Admitting: *Deleted

## 2020-04-09 ENCOUNTER — Inpatient Hospital Stay: Payer: No Typology Code available for payment source

## 2020-04-09 VITALS — BP 124/80 | HR 81 | Temp 97.8°F | Resp 20 | Ht <= 58 in | Wt 158.8 lb

## 2020-04-09 DIAGNOSIS — Z923 Personal history of irradiation: Secondary | ICD-10-CM | POA: Diagnosis not present

## 2020-04-09 DIAGNOSIS — C7951 Secondary malignant neoplasm of bone: Secondary | ICD-10-CM | POA: Diagnosis present

## 2020-04-09 DIAGNOSIS — M3214 Glomerular disease in systemic lupus erythematosus: Secondary | ICD-10-CM

## 2020-04-09 DIAGNOSIS — C771 Secondary and unspecified malignant neoplasm of intrathoracic lymph nodes: Secondary | ICD-10-CM

## 2020-04-09 DIAGNOSIS — Z7189 Other specified counseling: Secondary | ICD-10-CM

## 2020-04-09 DIAGNOSIS — I130 Hypertensive heart and chronic kidney disease with heart failure and stage 1 through stage 4 chronic kidney disease, or unspecified chronic kidney disease: Secondary | ICD-10-CM | POA: Insufficient documentation

## 2020-04-09 DIAGNOSIS — Z5111 Encounter for antineoplastic chemotherapy: Secondary | ICD-10-CM | POA: Insufficient documentation

## 2020-04-09 DIAGNOSIS — C50512 Malignant neoplasm of lower-outer quadrant of left female breast: Secondary | ICD-10-CM | POA: Insufficient documentation

## 2020-04-09 DIAGNOSIS — Z79810 Long term (current) use of selective estrogen receptor modulators (SERMs): Secondary | ICD-10-CM | POA: Diagnosis not present

## 2020-04-09 DIAGNOSIS — Z809 Family history of malignant neoplasm, unspecified: Secondary | ICD-10-CM | POA: Diagnosis not present

## 2020-04-09 DIAGNOSIS — M328 Other forms of systemic lupus erythematosus: Secondary | ICD-10-CM

## 2020-04-09 DIAGNOSIS — D649 Anemia, unspecified: Secondary | ICD-10-CM | POA: Insufficient documentation

## 2020-04-09 DIAGNOSIS — D561 Beta thalassemia: Secondary | ICD-10-CM

## 2020-04-09 DIAGNOSIS — N189 Chronic kidney disease, unspecified: Secondary | ICD-10-CM

## 2020-04-09 DIAGNOSIS — I700 Atherosclerosis of aorta: Secondary | ICD-10-CM | POA: Diagnosis not present

## 2020-04-09 DIAGNOSIS — Z9012 Acquired absence of left breast and nipple: Secondary | ICD-10-CM | POA: Diagnosis not present

## 2020-04-09 DIAGNOSIS — Z79811 Long term (current) use of aromatase inhibitors: Secondary | ICD-10-CM | POA: Insufficient documentation

## 2020-04-09 DIAGNOSIS — N179 Acute kidney failure, unspecified: Secondary | ICD-10-CM

## 2020-04-09 DIAGNOSIS — D61818 Other pancytopenia: Secondary | ICD-10-CM

## 2020-04-09 DIAGNOSIS — C50011 Malignant neoplasm of nipple and areola, right female breast: Secondary | ICD-10-CM

## 2020-04-09 DIAGNOSIS — Z170 Estrogen receptor positive status [ER+]: Secondary | ICD-10-CM

## 2020-04-09 DIAGNOSIS — I2510 Atherosclerotic heart disease of native coronary artery without angina pectoris: Secondary | ICD-10-CM | POA: Diagnosis not present

## 2020-04-09 DIAGNOSIS — Z803 Family history of malignant neoplasm of breast: Secondary | ICD-10-CM | POA: Insufficient documentation

## 2020-04-09 DIAGNOSIS — M329 Systemic lupus erythematosus, unspecified: Secondary | ICD-10-CM

## 2020-04-09 LAB — RETICULOCYTES
Immature Retic Fract: 13.5 % (ref 2.3–15.9)
RBC.: 1.92 MIL/uL — ABNORMAL LOW (ref 3.87–5.11)
Retic Count, Absolute: 54.9 10*3/uL (ref 19.0–186.0)
Retic Ct Pct: 2.9 % (ref 0.4–3.1)

## 2020-04-09 LAB — CBC WITH DIFFERENTIAL/PLATELET
Abs Immature Granulocytes: 0.02 10*3/uL (ref 0.00–0.07)
Basophils Absolute: 0 10*3/uL (ref 0.0–0.1)
Basophils Relative: 0 %
Eosinophils Absolute: 0 10*3/uL (ref 0.0–0.5)
Eosinophils Relative: 1 %
HCT: 17.6 % — ABNORMAL LOW (ref 36.0–46.0)
Hemoglobin: 5.7 g/dL — CL (ref 12.0–15.0)
Immature Granulocytes: 1 %
Lymphocytes Relative: 18 %
Lymphs Abs: 0.6 10*3/uL — ABNORMAL LOW (ref 0.7–4.0)
MCH: 29.8 pg (ref 26.0–34.0)
MCHC: 32.4 g/dL (ref 30.0–36.0)
MCV: 92.1 fL (ref 80.0–100.0)
Monocytes Absolute: 0.4 10*3/uL (ref 0.1–1.0)
Monocytes Relative: 11 %
Neutro Abs: 2.3 10*3/uL (ref 1.7–7.7)
Neutrophils Relative %: 69 %
Platelets: 158 10*3/uL (ref 150–400)
RBC: 1.91 MIL/uL — ABNORMAL LOW (ref 3.87–5.11)
RDW: 13.2 % (ref 11.5–15.5)
WBC: 3.3 10*3/uL — ABNORMAL LOW (ref 4.0–10.5)
nRBC: 0 % (ref 0.0–0.2)

## 2020-04-09 LAB — COMPREHENSIVE METABOLIC PANEL
ALT: 11 U/L (ref 0–44)
AST: 14 U/L — ABNORMAL LOW (ref 15–41)
Albumin: 3.4 g/dL — ABNORMAL LOW (ref 3.5–5.0)
Alkaline Phosphatase: 73 U/L (ref 38–126)
Anion gap: 11 (ref 5–15)
BUN: 59 mg/dL — ABNORMAL HIGH (ref 6–20)
CO2: 24 mmol/L (ref 22–32)
Calcium: 8.1 mg/dL — ABNORMAL LOW (ref 8.9–10.3)
Chloride: 105 mmol/L (ref 98–111)
Creatinine, Ser: 2.23 mg/dL — ABNORMAL HIGH (ref 0.44–1.00)
GFR calc Af Amer: 28 mL/min — ABNORMAL LOW (ref >60)
GFR calc non Af Amer: 24 mL/min — ABNORMAL LOW (ref >60)
Glucose, Bld: 82 mg/dL (ref 70–99)
Potassium: 3.9 mmol/L (ref 3.5–5.1)
Sodium: 140 mmol/L (ref 135–145)
Total Bilirubin: 0.4 mg/dL (ref 0.3–1.2)
Total Protein: 6.7 g/dL (ref 6.5–8.1)

## 2020-04-09 LAB — IRON AND TIBC
Iron: 101 ug/dL (ref 41–142)
Saturation Ratios: 40 % (ref 21–57)
TIBC: 253 ug/dL (ref 236–444)
UIBC: 152 ug/dL (ref 120–384)

## 2020-04-09 LAB — FERRITIN: Ferritin: 2084 ng/mL — ABNORMAL HIGH (ref 11–307)

## 2020-04-09 MED ORDER — FULVESTRANT 250 MG/5ML IM SOLN
500.0000 mg | Freq: Once | INTRAMUSCULAR | Status: AC
  Administered 2020-04-09: 500 mg via INTRAMUSCULAR

## 2020-04-09 MED ORDER — FULVESTRANT 250 MG/5ML IM SOLN
INTRAMUSCULAR | Status: AC
  Filled 2020-04-09: qty 10

## 2020-04-09 MED ORDER — DARBEPOETIN ALFA 300 MCG/0.6ML IJ SOSY
PREFILLED_SYRINGE | INTRAMUSCULAR | Status: AC
  Filled 2020-04-09: qty 0.6

## 2020-04-09 MED ORDER — DARBEPOETIN ALFA 200 MCG/0.4ML IJ SOSY
200.0000 ug | PREFILLED_SYRINGE | Freq: Once | INTRAMUSCULAR | Status: AC
  Administered 2020-04-09: 200 ug via SUBCUTANEOUS

## 2020-04-10 ENCOUNTER — Telehealth: Payer: Self-pay | Admitting: Oncology

## 2020-04-10 ENCOUNTER — Ambulatory Visit: Payer: No Typology Code available for payment source

## 2020-04-10 LAB — PREPARE RBC (CROSSMATCH)

## 2020-04-10 LAB — CANCER ANTIGEN 27.29: CA 27.29: 44.3 U/mL — ABNORMAL HIGH (ref 0.0–38.6)

## 2020-04-10 NOTE — Telephone Encounter (Signed)
Added transfusion to appts that were already scheduled. Did not change arrival time.

## 2020-04-11 ENCOUNTER — Other Ambulatory Visit: Payer: Self-pay

## 2020-04-11 ENCOUNTER — Inpatient Hospital Stay: Payer: No Typology Code available for payment source

## 2020-04-11 DIAGNOSIS — C7951 Secondary malignant neoplasm of bone: Secondary | ICD-10-CM

## 2020-04-11 DIAGNOSIS — D631 Anemia in chronic kidney disease: Secondary | ICD-10-CM

## 2020-04-11 DIAGNOSIS — C771 Secondary and unspecified malignant neoplasm of intrathoracic lymph nodes: Secondary | ICD-10-CM

## 2020-04-11 DIAGNOSIS — D649 Anemia, unspecified: Secondary | ICD-10-CM | POA: Diagnosis not present

## 2020-04-11 MED ORDER — ACETAMINOPHEN 325 MG PO TABS
650.0000 mg | ORAL_TABLET | Freq: Once | ORAL | Status: AC
  Administered 2020-04-11: 650 mg via ORAL

## 2020-04-11 MED ORDER — DIPHENHYDRAMINE HCL 25 MG PO CAPS
25.0000 mg | ORAL_CAPSULE | Freq: Once | ORAL | Status: AC
  Administered 2020-04-11: 25 mg via ORAL

## 2020-04-11 MED ORDER — ACETAMINOPHEN 325 MG PO TABS
ORAL_TABLET | ORAL | Status: AC
  Filled 2020-04-11: qty 2

## 2020-04-11 MED ORDER — SODIUM CHLORIDE 0.9% IV SOLUTION
250.0000 mL | Freq: Once | INTRAVENOUS | Status: AC
  Administered 2020-04-11: 250 mL via INTRAVENOUS
  Filled 2020-04-11: qty 250

## 2020-04-11 MED ORDER — DIPHENHYDRAMINE HCL 25 MG PO CAPS
ORAL_CAPSULE | ORAL | Status: AC
  Filled 2020-04-11: qty 1

## 2020-04-11 NOTE — Patient Instructions (Signed)

## 2020-04-12 LAB — TYPE AND SCREEN
ABO/RH(D): B POS
Antibody Screen: POSITIVE
Donor AG Type: NEGATIVE
Donor AG Type: NEGATIVE
PT AG Type: NEGATIVE
Unit division: 0
Unit division: 0

## 2020-04-12 LAB — BPAM RBC
Blood Product Expiration Date: 202107202359
Blood Product Expiration Date: 202107202359
ISSUE DATE / TIME: 202106241207
ISSUE DATE / TIME: 202106241207
Unit Type and Rh: 7300
Unit Type and Rh: 7300

## 2020-05-07 ENCOUNTER — Other Ambulatory Visit: Payer: Self-pay

## 2020-05-07 ENCOUNTER — Encounter: Payer: Self-pay | Admitting: Adult Health

## 2020-05-07 ENCOUNTER — Inpatient Hospital Stay: Payer: No Typology Code available for payment source

## 2020-05-07 ENCOUNTER — Ambulatory Visit: Payer: 59

## 2020-05-07 ENCOUNTER — Inpatient Hospital Stay: Payer: No Typology Code available for payment source | Attending: Oncology

## 2020-05-07 ENCOUNTER — Inpatient Hospital Stay (HOSPITAL_BASED_OUTPATIENT_CLINIC_OR_DEPARTMENT_OTHER): Payer: No Typology Code available for payment source | Admitting: Adult Health

## 2020-05-07 VITALS — BP 147/86 | HR 81 | Temp 98.3°F | Resp 18 | Ht <= 58 in | Wt 159.3 lb

## 2020-05-07 DIAGNOSIS — C7951 Secondary malignant neoplasm of bone: Secondary | ICD-10-CM

## 2020-05-07 DIAGNOSIS — Z923 Personal history of irradiation: Secondary | ICD-10-CM | POA: Insufficient documentation

## 2020-05-07 DIAGNOSIS — N189 Chronic kidney disease, unspecified: Secondary | ICD-10-CM | POA: Insufficient documentation

## 2020-05-07 DIAGNOSIS — Z9012 Acquired absence of left breast and nipple: Secondary | ICD-10-CM | POA: Diagnosis not present

## 2020-05-07 DIAGNOSIS — I130 Hypertensive heart and chronic kidney disease with heart failure and stage 1 through stage 4 chronic kidney disease, or unspecified chronic kidney disease: Secondary | ICD-10-CM | POA: Insufficient documentation

## 2020-05-07 DIAGNOSIS — D561 Beta thalassemia: Secondary | ICD-10-CM

## 2020-05-07 DIAGNOSIS — C50011 Malignant neoplasm of nipple and areola, right female breast: Secondary | ICD-10-CM | POA: Diagnosis not present

## 2020-05-07 DIAGNOSIS — I700 Atherosclerosis of aorta: Secondary | ICD-10-CM | POA: Insufficient documentation

## 2020-05-07 DIAGNOSIS — Z5111 Encounter for antineoplastic chemotherapy: Secondary | ICD-10-CM | POA: Diagnosis present

## 2020-05-07 DIAGNOSIS — I2510 Atherosclerotic heart disease of native coronary artery without angina pectoris: Secondary | ICD-10-CM | POA: Diagnosis not present

## 2020-05-07 DIAGNOSIS — M3214 Glomerular disease in systemic lupus erythematosus: Secondary | ICD-10-CM

## 2020-05-07 DIAGNOSIS — Z809 Family history of malignant neoplasm, unspecified: Secondary | ICD-10-CM | POA: Diagnosis not present

## 2020-05-07 DIAGNOSIS — I313 Pericardial effusion (noninflammatory): Secondary | ICD-10-CM | POA: Diagnosis not present

## 2020-05-07 DIAGNOSIS — M329 Systemic lupus erythematosus, unspecified: Secondary | ICD-10-CM

## 2020-05-07 DIAGNOSIS — Z803 Family history of malignant neoplasm of breast: Secondary | ICD-10-CM | POA: Diagnosis not present

## 2020-05-07 DIAGNOSIS — N179 Acute kidney failure, unspecified: Secondary | ICD-10-CM

## 2020-05-07 DIAGNOSIS — Z9221 Personal history of antineoplastic chemotherapy: Secondary | ICD-10-CM | POA: Insufficient documentation

## 2020-05-07 DIAGNOSIS — Z79810 Long term (current) use of selective estrogen receptor modulators (SERMs): Secondary | ICD-10-CM | POA: Diagnosis not present

## 2020-05-07 DIAGNOSIS — C771 Secondary and unspecified malignant neoplasm of intrathoracic lymph nodes: Secondary | ICD-10-CM

## 2020-05-07 DIAGNOSIS — D649 Anemia, unspecified: Secondary | ICD-10-CM | POA: Insufficient documentation

## 2020-05-07 DIAGNOSIS — Z170 Estrogen receptor positive status [ER+]: Secondary | ICD-10-CM

## 2020-05-07 DIAGNOSIS — D61818 Other pancytopenia: Secondary | ICD-10-CM

## 2020-05-07 DIAGNOSIS — Z79811 Long term (current) use of aromatase inhibitors: Secondary | ICD-10-CM | POA: Diagnosis not present

## 2020-05-07 DIAGNOSIS — Z853 Personal history of malignant neoplasm of breast: Secondary | ICD-10-CM | POA: Insufficient documentation

## 2020-05-07 LAB — CBC WITH DIFFERENTIAL/PLATELET
Abs Immature Granulocytes: 0.02 10*3/uL (ref 0.00–0.07)
Basophils Absolute: 0 10*3/uL (ref 0.0–0.1)
Basophils Relative: 1 %
Eosinophils Absolute: 0.1 10*3/uL (ref 0.0–0.5)
Eosinophils Relative: 2 %
HCT: 28 % — ABNORMAL LOW (ref 36.0–46.0)
Hemoglobin: 9.3 g/dL — ABNORMAL LOW (ref 12.0–15.0)
Immature Granulocytes: 1 %
Lymphocytes Relative: 17 %
Lymphs Abs: 0.7 10*3/uL (ref 0.7–4.0)
MCH: 30.6 pg (ref 26.0–34.0)
MCHC: 33.2 g/dL (ref 30.0–36.0)
MCV: 92.1 fL (ref 80.0–100.0)
Monocytes Absolute: 0.3 10*3/uL (ref 0.1–1.0)
Monocytes Relative: 9 %
Neutro Abs: 2.8 10*3/uL (ref 1.7–7.7)
Neutrophils Relative %: 70 %
Platelets: 162 10*3/uL (ref 150–400)
RBC: 3.04 MIL/uL — ABNORMAL LOW (ref 3.87–5.11)
RDW: 12.1 % (ref 11.5–15.5)
WBC: 3.9 10*3/uL — ABNORMAL LOW (ref 4.0–10.5)
nRBC: 0 % (ref 0.0–0.2)

## 2020-05-07 LAB — COMPREHENSIVE METABOLIC PANEL
ALT: 11 U/L (ref 0–44)
AST: 15 U/L (ref 15–41)
Albumin: 3.4 g/dL — ABNORMAL LOW (ref 3.5–5.0)
Alkaline Phosphatase: 87 U/L (ref 38–126)
Anion gap: 11 (ref 5–15)
BUN: 54 mg/dL — ABNORMAL HIGH (ref 6–20)
CO2: 24 mmol/L (ref 22–32)
Calcium: 9 mg/dL (ref 8.9–10.3)
Chloride: 106 mmol/L (ref 98–111)
Creatinine, Ser: 1.96 mg/dL — ABNORMAL HIGH (ref 0.44–1.00)
GFR calc Af Amer: 33 mL/min — ABNORMAL LOW (ref >60)
GFR calc non Af Amer: 28 mL/min — ABNORMAL LOW (ref >60)
Glucose, Bld: 92 mg/dL (ref 70–99)
Potassium: 3.6 mmol/L (ref 3.5–5.1)
Sodium: 141 mmol/L (ref 135–145)
Total Bilirubin: 0.4 mg/dL (ref 0.3–1.2)
Total Protein: 6.9 g/dL (ref 6.5–8.1)

## 2020-05-07 LAB — RETICULOCYTES
Immature Retic Fract: 5 % (ref 2.3–15.9)
RBC.: 2.99 MIL/uL — ABNORMAL LOW (ref 3.87–5.11)
Retic Count, Absolute: 45.1 10*3/uL (ref 19.0–186.0)
Retic Ct Pct: 1.5 % (ref 0.4–3.1)

## 2020-05-07 LAB — IRON AND TIBC
Iron: 115 ug/dL (ref 41–142)
Saturation Ratios: 49 % (ref 21–57)
TIBC: 233 ug/dL — ABNORMAL LOW (ref 236–444)
UIBC: 118 ug/dL — ABNORMAL LOW (ref 120–384)

## 2020-05-07 LAB — FERRITIN: Ferritin: 2487 ng/mL — ABNORMAL HIGH (ref 11–307)

## 2020-05-07 MED ORDER — DARBEPOETIN ALFA 200 MCG/0.4ML IJ SOSY
200.0000 ug | PREFILLED_SYRINGE | Freq: Once | INTRAMUSCULAR | Status: AC
  Administered 2020-05-07: 200 ug via SUBCUTANEOUS

## 2020-05-07 MED ORDER — FULVESTRANT 250 MG/5ML IM SOLN
500.0000 mg | Freq: Once | INTRAMUSCULAR | Status: AC
  Administered 2020-05-07: 500 mg via INTRAMUSCULAR

## 2020-05-07 MED ORDER — FULVESTRANT 250 MG/5ML IM SOLN
INTRAMUSCULAR | Status: AC
  Filled 2020-05-07: qty 10

## 2020-05-07 MED ORDER — DARBEPOETIN ALFA 200 MCG/0.4ML IJ SOSY
PREFILLED_SYRINGE | INTRAMUSCULAR | Status: AC
  Filled 2020-05-07: qty 0.4

## 2020-05-07 NOTE — Patient Instructions (Signed)
Fulvestrant injection What is this medicine? FULVESTRANT (ful VES trant) blocks the effects of estrogen. It is used to treat breast cancer. This medicine may be used for other purposes; ask your health care provider or pharmacist if you have questions. COMMON BRAND NAME(S): FASLODEX What should I tell my health care provider before I take this medicine? They need to know if you have any of these conditions:  bleeding disorders  liver disease  low blood counts, like low white cell, platelet, or red cell counts  an unusual or allergic reaction to fulvestrant, other medicines, foods, dyes, or preservatives  pregnant or trying to get pregnant  breast-feeding How should I use this medicine? This medicine is for injection into a muscle. It is usually given by a health care professional in a hospital or clinic setting. Talk to your pediatrician regarding the use of this medicine in children. Special care may be needed. Overdosage: If you think you have taken too much of this medicine contact a poison control center or emergency room at once. NOTE: This medicine is only for you. Do not share this medicine with others. What if I miss a dose? It is important not to miss your dose. Call your doctor or health care professional if you are unable to keep an appointment. What may interact with this medicine?  medicines that treat or prevent blood clots like warfarin, enoxaparin, dalteparin, apixaban, dabigatran, and rivaroxaban This list may not describe all possible interactions. Give your health care provider a list of all the medicines, herbs, non-prescription drugs, or dietary supplements you use. Also tell them if you smoke, drink alcohol, or use illegal drugs. Some items may interact with your medicine. What should I watch for while using this medicine? Your condition will be monitored carefully while you are receiving this medicine. You will need important blood work done while you are taking  this medicine. Do not become pregnant while taking this medicine or for at least 1 year after stopping it. Women of child-bearing potential will need to have a negative pregnancy test before starting this medicine. Women should inform their doctor if they wish to become pregnant or think they might be pregnant. There is a potential for serious side effects to an unborn child. Men should inform their doctors if they wish to father a child. This medicine may lower sperm counts. Talk to your health care professional or pharmacist for more information. Do not breast-feed an infant while taking this medicine or for 1 year after the last dose. What side effects may I notice from receiving this medicine? Side effects that you should report to your doctor or health care professional as soon as possible:  allergic reactions like skin rash, itching or hives, swelling of the face, lips, or tongue  feeling faint or lightheaded, falls  pain, tingling, numbness, or weakness in the legs  signs and symptoms of infection like fever or chills; cough; flu-like symptoms; sore throat  vaginal bleeding Side effects that usually do not require medical attention (report to your doctor or health care professional if they continue or are bothersome):  aches, pains  constipation  diarrhea  headache  hot flashes  nausea, vomiting  pain at site where injected  stomach pain This list may not describe all possible side effects. Call your doctor for medical advice about side effects. You may report side effects to FDA at 1-800-FDA-1088. Where should I keep my medicine? This drug is given in a hospital or clinic and will   not be stored at home. NOTE: This sheet is a summary. It may not cover all possible information. If you have questions about this medicine, talk to your doctor, pharmacist, or health care provider.  2020 Elsevier/Gold Standard (2018-01-13 11:34:41)  
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
## 2020-05-08 ENCOUNTER — Telehealth: Payer: Self-pay | Admitting: Adult Health

## 2020-05-08 LAB — CANCER ANTIGEN 27.29: CA 27.29: 45.5 U/mL — ABNORMAL HIGH (ref 0.0–38.6)

## 2020-05-08 NOTE — Telephone Encounter (Signed)
Scheduled appts per 7/20 los. Was not able to leave voicemail. Pt confirmed appt date and time.

## 2020-05-09 ENCOUNTER — Other Ambulatory Visit: Payer: Self-pay | Admitting: Oncology

## 2020-05-28 ENCOUNTER — Ambulatory Visit
Admission: RE | Admit: 2020-05-28 | Discharge: 2020-05-28 | Disposition: A | Discharge status: Home / Self Care | Payer: No Typology Code available for payment source | Source: Ambulatory Visit | Attending: Oncology | Admitting: Oncology

## 2020-05-28 ENCOUNTER — Encounter: Payer: Self-pay | Admitting: Oncology

## 2020-05-28 ENCOUNTER — Ambulatory Visit: Payer: No Typology Code available for payment source

## 2020-05-28 ENCOUNTER — Other Ambulatory Visit: Payer: Self-pay

## 2020-05-28 DIAGNOSIS — C771 Secondary and unspecified malignant neoplasm of intrathoracic lymph nodes: Secondary | ICD-10-CM | POA: Diagnosis not present

## 2020-05-28 DIAGNOSIS — C7951 Secondary malignant neoplasm of bone: Secondary | ICD-10-CM | POA: Diagnosis present

## 2020-05-28 MED ORDER — TECHNETIUM TC 99M MEDRONATE IV KIT
21.5900 | PACK | Freq: Once | INTRAVENOUS | Status: AC | PRN
  Administered 2020-05-28: 21.59 via INTRAVENOUS

## 2020-05-31 ENCOUNTER — Encounter: Payer: Self-pay | Admitting: Genetic Counselor

## 2020-06-04 ENCOUNTER — Inpatient Hospital Stay: Payer: No Typology Code available for payment source

## 2020-06-04 ENCOUNTER — Ambulatory Visit: Payer: No Typology Code available for payment source

## 2020-06-04 ENCOUNTER — Other Ambulatory Visit: Payer: 59

## 2020-06-04 ENCOUNTER — Ambulatory Visit: Payer: No Typology Code available for payment source | Admitting: Nurse Practitioner

## 2020-06-04 ENCOUNTER — Ambulatory Visit: Payer: 59
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
## 2020-06-05 ENCOUNTER — Other Ambulatory Visit: Payer: Self-pay

## 2020-06-05 ENCOUNTER — Inpatient Hospital Stay: Payer: No Typology Code available for payment source

## 2020-06-05 ENCOUNTER — Inpatient Hospital Stay: Payer: No Typology Code available for payment source | Attending: Oncology | Admitting: Oncology

## 2020-06-05 VITALS — BP 158/80 | HR 88 | Temp 98.7°F | Resp 18

## 2020-06-05 VITALS — BP 129/77 | HR 81 | Temp 97.9°F | Resp 18 | Ht <= 58 in | Wt 158.6 lb

## 2020-06-05 DIAGNOSIS — D631 Anemia in chronic kidney disease: Secondary | ICD-10-CM | POA: Insufficient documentation

## 2020-06-05 DIAGNOSIS — Z170 Estrogen receptor positive status [ER+]: Secondary | ICD-10-CM

## 2020-06-05 DIAGNOSIS — Z9012 Acquired absence of left breast and nipple: Secondary | ICD-10-CM | POA: Diagnosis not present

## 2020-06-05 DIAGNOSIS — C771 Secondary and unspecified malignant neoplasm of intrathoracic lymph nodes: Secondary | ICD-10-CM

## 2020-06-05 DIAGNOSIS — Z79811 Long term (current) use of aromatase inhibitors: Secondary | ICD-10-CM | POA: Insufficient documentation

## 2020-06-05 DIAGNOSIS — Z853 Personal history of malignant neoplasm of breast: Secondary | ICD-10-CM | POA: Diagnosis not present

## 2020-06-05 DIAGNOSIS — I130 Hypertensive heart and chronic kidney disease with heart failure and stage 1 through stage 4 chronic kidney disease, or unspecified chronic kidney disease: Secondary | ICD-10-CM | POA: Insufficient documentation

## 2020-06-05 DIAGNOSIS — D61818 Other pancytopenia: Secondary | ICD-10-CM

## 2020-06-05 DIAGNOSIS — N189 Chronic kidney disease, unspecified: Secondary | ICD-10-CM | POA: Insufficient documentation

## 2020-06-05 DIAGNOSIS — C50011 Malignant neoplasm of nipple and areola, right female breast: Secondary | ICD-10-CM

## 2020-06-05 DIAGNOSIS — M329 Systemic lupus erythematosus, unspecified: Secondary | ICD-10-CM

## 2020-06-05 DIAGNOSIS — Z79810 Long term (current) use of selective estrogen receptor modulators (SERMs): Secondary | ICD-10-CM | POA: Insufficient documentation

## 2020-06-05 DIAGNOSIS — C50512 Malignant neoplasm of lower-outer quadrant of left female breast: Secondary | ICD-10-CM

## 2020-06-05 DIAGNOSIS — Z923 Personal history of irradiation: Secondary | ICD-10-CM | POA: Diagnosis not present

## 2020-06-05 DIAGNOSIS — S82892A Other fracture of left lower leg, initial encounter for closed fracture: Secondary | ICD-10-CM | POA: Insufficient documentation

## 2020-06-05 DIAGNOSIS — Z5111 Encounter for antineoplastic chemotherapy: Secondary | ICD-10-CM | POA: Diagnosis present

## 2020-06-05 DIAGNOSIS — N179 Acute kidney failure, unspecified: Secondary | ICD-10-CM

## 2020-06-05 DIAGNOSIS — D561 Beta thalassemia: Secondary | ICD-10-CM

## 2020-06-05 DIAGNOSIS — Z803 Family history of malignant neoplasm of breast: Secondary | ICD-10-CM | POA: Insufficient documentation

## 2020-06-05 DIAGNOSIS — Z9221 Personal history of antineoplastic chemotherapy: Secondary | ICD-10-CM | POA: Diagnosis not present

## 2020-06-05 DIAGNOSIS — Z809 Family history of malignant neoplasm, unspecified: Secondary | ICD-10-CM | POA: Insufficient documentation

## 2020-06-05 DIAGNOSIS — D509 Iron deficiency anemia, unspecified: Secondary | ICD-10-CM

## 2020-06-05 DIAGNOSIS — C7951 Secondary malignant neoplasm of bone: Secondary | ICD-10-CM

## 2020-06-05 DIAGNOSIS — M3214 Glomerular disease in systemic lupus erythematosus: Secondary | ICD-10-CM | POA: Diagnosis not present

## 2020-06-05 DIAGNOSIS — D649 Anemia, unspecified: Secondary | ICD-10-CM | POA: Diagnosis present

## 2020-06-05 DIAGNOSIS — I700 Atherosclerosis of aorta: Secondary | ICD-10-CM | POA: Diagnosis not present

## 2020-06-05 DIAGNOSIS — I2510 Atherosclerotic heart disease of native coronary artery without angina pectoris: Secondary | ICD-10-CM | POA: Diagnosis not present

## 2020-06-05 DIAGNOSIS — C50012 Malignant neoplasm of nipple and areola, left female breast: Secondary | ICD-10-CM

## 2020-06-05 LAB — COMPREHENSIVE METABOLIC PANEL
ALT: <6 U/L (ref 0–44)
AST: 12 U/L — ABNORMAL LOW (ref 15–41)
Albumin: 3.4 g/dL — ABNORMAL LOW (ref 3.5–5.0)
Alkaline Phosphatase: 84 U/L (ref 38–126)
Anion gap: 10 (ref 5–15)
BUN: 70 mg/dL — ABNORMAL HIGH (ref 6–20)
CO2: 23 mmol/L (ref 22–32)
Calcium: 8.7 mg/dL — ABNORMAL LOW (ref 8.9–10.3)
Chloride: 104 mmol/L (ref 98–111)
Creatinine, Ser: 2.18 mg/dL — ABNORMAL HIGH (ref 0.44–1.00)
GFR calc Af Amer: 29 mL/min — ABNORMAL LOW (ref >60)
GFR calc non Af Amer: 25 mL/min — ABNORMAL LOW (ref >60)
Glucose, Bld: 95 mg/dL (ref 70–99)
Potassium: 3.5 mmol/L (ref 3.5–5.1)
Sodium: 137 mmol/L (ref 135–145)
Total Bilirubin: 0.5 mg/dL (ref 0.3–1.2)
Total Protein: 6.9 g/dL (ref 6.5–8.1)

## 2020-06-05 LAB — CBC WITH DIFFERENTIAL/PLATELET
Abs Immature Granulocytes: 0.02 10*3/uL (ref 0.00–0.07)
Basophils Absolute: 0 10*3/uL (ref 0.0–0.1)
Basophils Relative: 0 %
Eosinophils Absolute: 0.1 10*3/uL (ref 0.0–0.5)
Eosinophils Relative: 1 %
HCT: 18.2 % — ABNORMAL LOW (ref 36.0–46.0)
Hemoglobin: 6.3 g/dL — CL (ref 12.0–15.0)
Immature Granulocytes: 1 %
Lymphocytes Relative: 17 %
Lymphs Abs: 0.7 10*3/uL (ref 0.7–4.0)
MCH: 30.6 pg (ref 26.0–34.0)
MCHC: 34.6 g/dL (ref 30.0–36.0)
MCV: 88.3 fL (ref 80.0–100.0)
Monocytes Absolute: 0.5 10*3/uL (ref 0.1–1.0)
Monocytes Relative: 11 %
Neutro Abs: 3 10*3/uL (ref 1.7–7.7)
Neutrophils Relative %: 70 %
Platelets: 208 10*3/uL (ref 150–400)
RBC: 2.06 MIL/uL — ABNORMAL LOW (ref 3.87–5.11)
RDW: 11.5 % (ref 11.5–15.5)
WBC: 4.3 10*3/uL (ref 4.0–10.5)
nRBC: 0 % (ref 0.0–0.2)

## 2020-06-05 LAB — RETICULOCYTES
Immature Retic Fract: 11 % (ref 2.3–15.9)
RBC.: 2.03 MIL/uL — ABNORMAL LOW (ref 3.87–5.11)
Retic Count, Absolute: 36.7 10*3/uL (ref 19.0–186.0)
Retic Ct Pct: 1.8 % (ref 0.4–3.1)

## 2020-06-05 LAB — SAMPLE TO BLOOD BANK

## 2020-06-05 LAB — PREPARE RBC (CROSSMATCH)

## 2020-06-05 MED ORDER — FULVESTRANT 250 MG/5ML IM SOLN
INTRAMUSCULAR | Status: AC
  Filled 2020-06-05: qty 10

## 2020-06-05 MED ORDER — DARBEPOETIN ALFA 200 MCG/0.4ML IJ SOSY
200.0000 ug | PREFILLED_SYRINGE | Freq: Once | INTRAMUSCULAR | Status: AC
  Administered 2020-06-05: 200 ug via SUBCUTANEOUS

## 2020-06-05 MED ORDER — FULVESTRANT 250 MG/5ML IM SOLN
500.0000 mg | Freq: Once | INTRAMUSCULAR | Status: AC
  Administered 2020-06-05: 500 mg via INTRAMUSCULAR

## 2020-06-05 MED ORDER — DARBEPOETIN ALFA 200 MCG/0.4ML IJ SOSY
PREFILLED_SYRINGE | INTRAMUSCULAR | Status: AC
  Filled 2020-06-05: qty 0.4

## 2020-06-05 NOTE — Patient Instructions (Signed)
Darbepoetin Alfa injection What is this medicine? DARBEPOETIN ALFA (dar be POE e tin AL fa) helps your body make more red blood cells. It is used to treat anemia caused by chronic kidney failure and chemotherapy. This medicine may be used for other purposes; ask your health care provider or pharmacist if you have questions. COMMON BRAND NAME(S): Aranesp What should I tell my health care provider before I take this medicine? They need to know if you have any of these conditions:  blood clotting disorders or history of blood clots  cancer patient not on chemotherapy  cystic fibrosis  heart disease, such as angina, heart failure, or a history of a heart attack  hemoglobin level of 12 g/dL or greater  high blood pressure  low levels of folate, iron, or vitamin B12  seizures  an unusual or allergic reaction to darbepoetin, erythropoietin, albumin, hamster proteins, latex, other medicines, foods, dyes, or preservatives  pregnant or trying to get pregnant  breast-feeding How should I use this medicine? This medicine is for injection into a vein or under the skin. It is usually given by a health care professional in a hospital or clinic setting. If you get this medicine at home, you will be taught how to prepare and give this medicine. Use exactly as directed. Take your medicine at regular intervals. Do not take your medicine more often than directed. It is important that you put your used needles and syringes in a special sharps container. Do not put them in a trash can. If you do not have a sharps container, call your pharmacist or healthcare provider to get one. A special MedGuide will be given to you by the pharmacist with each prescription and refill. Be sure to read this information carefully each time. Talk to your pediatrician regarding the use of this medicine in children. While this medicine may be used in children as young as 1 month of age for selected conditions, precautions do  apply. Overdosage: If you think you have taken too much of this medicine contact a poison control center or emergency room at once. NOTE: This medicine is only for you. Do not share this medicine with others. What if I miss a dose? If you miss a dose, take it as soon as you can. If it is almost time for your next dose, take only that dose. Do not take double or extra doses. What may interact with this medicine? Do not take this medicine with any of the following medications:  epoetin alfa This list may not describe all possible interactions. Give your health care provider a list of all the medicines, herbs, non-prescription drugs, or dietary supplements you use. Also tell them if you smoke, drink alcohol, or use illegal drugs. Some items may interact with your medicine. What should I watch for while using this medicine? Your condition will be monitored carefully while you are receiving this medicine. You may need blood work done while you are taking this medicine. This medicine may cause a decrease in vitamin B6. You should make sure that you get enough vitamin B6 while you are taking this medicine. Discuss the foods you eat and the vitamins you take with your health care professional. What side effects may I notice from receiving this medicine? Side effects that you should report to your doctor or health care professional as soon as possible:  allergic reactions like skin rash, itching or hives, swelling of the face, lips, or tongue  breathing problems  changes in   vision  chest pain  confusion, trouble speaking or understanding  feeling faint or lightheaded, falls  high blood pressure  muscle aches or pains  pain, swelling, warmth in the leg  rapid weight gain  severe headaches  sudden numbness or weakness of the face, arm or leg  trouble walking, dizziness, loss of balance or coordination  seizures (convulsions)  swelling of the ankles, feet, hands  unusually weak or  tired Side effects that usually do not require medical attention (report to your doctor or health care professional if they continue or are bothersome):  diarrhea  fever, chills (flu-like symptoms)  headaches  nausea, vomiting  redness, stinging, or swelling at site where injected This list may not describe all possible side effects. Call your doctor for medical advice about side effects. You may report side effects to FDA at 1-800-FDA-1088. Where should I keep my medicine? Keep out of the reach of children. Store in a refrigerator between 2 and 8 degrees C (36 and 46 degrees F). Do not freeze. Do not shake. Throw away any unused portion if using a single-dose vial. Throw away any unused medicine after the expiration date. NOTE: This sheet is a summary. It may not cover all possible information. If you have questions about this medicine, talk to your doctor, pharmacist, or health care provider.  2020 Elsevier/Gold Standard (2017-10-20 16:44:20) Fulvestrant injection What is this medicine? FULVESTRANT (ful VES trant) blocks the effects of estrogen. It is used to treat breast cancer. This medicine may be used for other purposes; ask your health care provider or pharmacist if you have questions. COMMON BRAND NAME(S): FASLODEX What should I tell my health care provider before I take this medicine? They need to know if you have any of these conditions:  bleeding disorders  liver disease  low blood counts, like low white cell, platelet, or red cell counts  an unusual or allergic reaction to fulvestrant, other medicines, foods, dyes, or preservatives  pregnant or trying to get pregnant  breast-feeding How should I use this medicine? This medicine is for injection into a muscle. It is usually given by a health care professional in a hospital or clinic setting. Talk to your pediatrician regarding the use of this medicine in children. Special care may be needed. Overdosage: If you  think you have taken too much of this medicine contact a poison control center or emergency room at once. NOTE: This medicine is only for you. Do not share this medicine with others. What if I miss a dose? It is important not to miss your dose. Call your doctor or health care professional if you are unable to keep an appointment. What may interact with this medicine?  medicines that treat or prevent blood clots like warfarin, enoxaparin, dalteparin, apixaban, dabigatran, and rivaroxaban This list may not describe all possible interactions. Give your health care provider a list of all the medicines, herbs, non-prescription drugs, or dietary supplements you use. Also tell them if you smoke, drink alcohol, or use illegal drugs. Some items may interact with your medicine. What should I watch for while using this medicine? Your condition will be monitored carefully while you are receiving this medicine. You will need important blood work done while you are taking this medicine. Do not become pregnant while taking this medicine or for at least 1 year after stopping it. Women of child-bearing potential will need to have a negative pregnancy test before starting this medicine. Women should inform their doctor if they wish  to become pregnant or think they might be pregnant. There is a potential for serious side effects to an unborn child. Men should inform their doctors if they wish to father a child. This medicine may lower sperm counts. Talk to your health care professional or pharmacist for more information. Do not breast-feed an infant while taking this medicine or for 1 year after the last dose. What side effects may I notice from receiving this medicine? Side effects that you should report to your doctor or health care professional as soon as possible:  allergic reactions like skin rash, itching or hives, swelling of the face, lips, or tongue  feeling faint or lightheaded, falls  pain, tingling,  numbness, or weakness in the legs  signs and symptoms of infection like fever or chills; cough; flu-like symptoms; sore throat  vaginal bleeding Side effects that usually do not require medical attention (report to your doctor or health care professional if they continue or are bothersome):  aches, pains  constipation  diarrhea  headache  hot flashes  nausea, vomiting  pain at site where injected  stomach pain This list may not describe all possible side effects. Call your doctor for medical advice about side effects. You may report side effects to FDA at 1-800-FDA-1088. Where should I keep my medicine? This drug is given in a hospital or clinic and will not be stored at home. NOTE: This sheet is a summary. It may not cover all possible information. If you have questions about this medicine, talk to your doctor, pharmacist, or health care provider.  2020 Elsevier/Gold Standard (2018-01-13 11:34:41)

## 2020-06-06 ENCOUNTER — Other Ambulatory Visit: Payer: Self-pay | Admitting: *Deleted

## 2020-06-06 ENCOUNTER — Other Ambulatory Visit: Payer: Self-pay

## 2020-06-06 ENCOUNTER — Inpatient Hospital Stay: Payer: No Typology Code available for payment source

## 2020-06-06 DIAGNOSIS — D649 Anemia, unspecified: Secondary | ICD-10-CM | POA: Diagnosis not present

## 2020-06-06 DIAGNOSIS — D638 Anemia in other chronic diseases classified elsewhere: Secondary | ICD-10-CM

## 2020-06-06 LAB — IRON AND TIBC
Iron: 129 ug/dL (ref 41–142)
Saturation Ratios: 54 % (ref 21–57)
TIBC: 239 ug/dL (ref 236–444)
UIBC: 110 ug/dL — ABNORMAL LOW (ref 120–384)

## 2020-06-06 LAB — FERRITIN: Ferritin: 2732 ng/mL — ABNORMAL HIGH (ref 11–307)

## 2020-06-06 LAB — SAMPLE TO BLOOD BANK

## 2020-06-06 LAB — CANCER ANTIGEN 27.29: CA 27.29: 53.9 U/mL — ABNORMAL HIGH (ref 0.0–38.6)

## 2020-06-07 ENCOUNTER — Telehealth: Payer: Self-pay | Admitting: Hematology and Oncology

## 2020-06-07 ENCOUNTER — Other Ambulatory Visit: Payer: Self-pay

## 2020-06-07 ENCOUNTER — Inpatient Hospital Stay: Payer: No Typology Code available for payment source

## 2020-06-07 DIAGNOSIS — D649 Anemia, unspecified: Secondary | ICD-10-CM | POA: Diagnosis not present

## 2020-06-07 DIAGNOSIS — D509 Iron deficiency anemia, unspecified: Secondary | ICD-10-CM

## 2020-06-07 MED ORDER — DIPHENHYDRAMINE HCL 25 MG PO CAPS
ORAL_CAPSULE | ORAL | Status: AC
  Filled 2020-06-07: qty 1

## 2020-06-07 MED ORDER — SODIUM CHLORIDE 0.9% IV SOLUTION
250.0000 mL | Freq: Once | INTRAVENOUS | Status: AC
  Administered 2020-06-07: 250 mL via INTRAVENOUS
  Filled 2020-06-07: qty 250

## 2020-06-07 MED ORDER — DIPHENHYDRAMINE HCL 25 MG PO CAPS
25.0000 mg | ORAL_CAPSULE | Freq: Once | ORAL | Status: AC
  Administered 2020-06-07: 25 mg via ORAL

## 2020-06-07 MED ORDER — ACETAMINOPHEN 325 MG PO TABS
650.0000 mg | ORAL_TABLET | Freq: Once | ORAL | Status: AC
  Administered 2020-06-07: 650 mg via ORAL

## 2020-06-07 MED ORDER — ACETAMINOPHEN 325 MG PO TABS
ORAL_TABLET | ORAL | Status: AC
  Filled 2020-06-07: qty 2

## 2020-06-07 NOTE — Telephone Encounter (Signed)
Scheduled appts per 8/18 los. Pt confirmed next appt date and time.

## 2020-06-07 NOTE — Patient Instructions (Signed)

## 2020-06-08 LAB — TYPE AND SCREEN
ABO/RH(D): B POS
Antibody Screen: POSITIVE
DAT, IgG: POSITIVE
Unit division: 0
Unit division: 0

## 2020-06-08 LAB — BPAM RBC
Blood Product Expiration Date: 202109172359
Blood Product Expiration Date: 202109172359
ISSUE DATE / TIME: 202108201346
ISSUE DATE / TIME: 202108201346
Unit Type and Rh: 5100
Unit Type and Rh: 5100

## 2020-06-11 ENCOUNTER — Other Ambulatory Visit: Payer: Self-pay | Admitting: *Deleted

## 2020-06-11 ENCOUNTER — Telehealth: Payer: Self-pay | Admitting: Oncology

## 2020-06-11 DIAGNOSIS — D638 Anemia in other chronic diseases classified elsewhere: Secondary | ICD-10-CM

## 2020-06-11 NOTE — Telephone Encounter (Signed)
Called pt to schedule an office visit with [facility] per 8/21 staff msg. Explained to pt that we would have to move appt out by a day as provider is not in the office on 9/21. Pt stated she would call back and hung up.

## 2020-07-01 ENCOUNTER — Encounter: Payer: Self-pay | Admitting: Family Medicine

## 2020-07-01 ENCOUNTER — Other Ambulatory Visit: Payer: Self-pay

## 2020-07-01 ENCOUNTER — Ambulatory Visit (INDEPENDENT_AMBULATORY_CARE_PROVIDER_SITE_OTHER): Payer: No Typology Code available for payment source | Admitting: Family Medicine

## 2020-07-01 VITALS — BP 122/80 | HR 84 | Temp 98.0°F | Ht 59.75 in | Wt 159.0 lb

## 2020-07-01 DIAGNOSIS — I10 Essential (primary) hypertension: Secondary | ICD-10-CM | POA: Diagnosis not present

## 2020-07-01 DIAGNOSIS — E785 Hyperlipidemia, unspecified: Secondary | ICD-10-CM

## 2020-07-01 DIAGNOSIS — M3214 Glomerular disease in systemic lupus erythematosus: Secondary | ICD-10-CM

## 2020-07-01 DIAGNOSIS — M329 Systemic lupus erythematosus, unspecified: Secondary | ICD-10-CM

## 2020-07-01 DIAGNOSIS — Z0001 Encounter for general adult medical examination with abnormal findings: Secondary | ICD-10-CM | POA: Diagnosis not present

## 2020-07-01 DIAGNOSIS — I5022 Chronic systolic (congestive) heart failure: Secondary | ICD-10-CM

## 2020-07-01 DIAGNOSIS — C50011 Malignant neoplasm of nipple and areola, right female breast: Secondary | ICD-10-CM

## 2020-07-01 DIAGNOSIS — E669 Obesity, unspecified: Secondary | ICD-10-CM

## 2020-07-01 DIAGNOSIS — C7951 Secondary malignant neoplasm of bone: Secondary | ICD-10-CM

## 2020-07-01 DIAGNOSIS — E559 Vitamin D deficiency, unspecified: Secondary | ICD-10-CM

## 2020-07-01 DIAGNOSIS — E66811 Obesity, class 1: Secondary | ICD-10-CM

## 2020-07-01 DIAGNOSIS — IMO0002 Reserved for concepts with insufficient information to code with codable children: Secondary | ICD-10-CM

## 2020-07-01 DIAGNOSIS — I38 Endocarditis, valve unspecified: Secondary | ICD-10-CM

## 2020-07-01 DIAGNOSIS — D638 Anemia in other chronic diseases classified elsewhere: Secondary | ICD-10-CM | POA: Diagnosis not present

## 2020-07-01 DIAGNOSIS — L930 Discoid lupus erythematosus: Secondary | ICD-10-CM

## 2020-07-01 DIAGNOSIS — N184 Chronic kidney disease, stage 4 (severe): Secondary | ICD-10-CM

## 2020-07-01 DIAGNOSIS — D561 Beta thalassemia: Secondary | ICD-10-CM

## 2020-07-01 DIAGNOSIS — I700 Atherosclerosis of aorta: Secondary | ICD-10-CM

## 2020-07-01 NOTE — Patient Instructions (Addendum)
Good to see you today You are doing well  See if you can get blood work I've ordered added on to your next blood draw at the cancer center.   Health Maintenance for Postmenopausal Women Menopause is a normal process in which your ability to get pregnant comes to an end. This process happens slowly over many months or years, usually between the ages of 30 and 11. Menopause is complete when you have missed your menstrual periods for 12 months. It is important to talk with your health care provider about some of the most common conditions that affect women after menopause (postmenopausal women). These include heart disease, cancer, and bone loss (osteoporosis). Adopting a healthy lifestyle and getting preventive care can help to promote your health and wellness. The actions you take can also lower your chances of developing some of these common conditions. What should I know about menopause? During menopause, you may get a number of symptoms, such as:  Hot flashes. These can be moderate or severe.  Night sweats.  Decrease in sex drive.  Mood swings.  Headaches.  Tiredness.  Irritability.  Memory problems.  Insomnia. Choosing to treat or not to treat these symptoms is a decision that you make with your health care provider. Do I need hormone replacement therapy?  Hormone replacement therapy is effective in treating symptoms that are caused by menopause, such as hot flashes and night sweats.  Hormone replacement carries certain risks, especially as you become older. If you are thinking about using estrogen or estrogen with progestin, discuss the benefits and risks with your health care provider. What is my risk for heart disease and stroke? The risk of heart disease, heart attack, and stroke increases as you age. One of the causes may be a change in the body's hormones during menopause. This can affect how your body uses dietary fats, triglycerides, and cholesterol. Heart attack and  stroke are medical emergencies. There are many things that you can do to help prevent heart disease and stroke. Watch your blood pressure  High blood pressure causes heart disease and increases the risk of stroke. This is more likely to develop in people who have high blood pressure readings, are of African descent, or are overweight.  Have your blood pressure checked: ? Every 3-5 years if you are 33-25 years of age. ? Every year if you are 91 years old or older. Eat a healthy diet   Eat a diet that includes plenty of vegetables, fruits, low-fat dairy products, and lean protein.  Do not eat a lot of foods that are high in solid fats, added sugars, or sodium. Get regular exercise Get regular exercise. This is one of the most important things you can do for your health. Most adults should:  Try to exercise for at least 150 minutes each week. The exercise should increase your heart rate and make you sweat (moderate-intensity exercise).  Try to do strengthening exercises at least twice each week. Do these in addition to the moderate-intensity exercise.  Spend less time sitting. Even light physical activity can be beneficial. Other tips  Work with your health care provider to achieve or maintain a healthy weight.  Do not use any products that contain nicotine or tobacco, such as cigarettes, e-cigarettes, and chewing tobacco. If you need help quitting, ask your health care provider.  Know your numbers. Ask your health care provider to check your cholesterol and your blood sugar (glucose). Continue to have your blood tested as directed by  your health care provider. Do I need screening for cancer? Depending on your health history and family history, you may need to have cancer screening at different stages of your life. This may include screening for:  Breast cancer.  Cervical cancer.  Lung cancer.  Colorectal cancer. What is my risk for osteoporosis? After menopause, you may be at  increased risk for osteoporosis. Osteoporosis is a condition in which bone destruction happens more quickly than new bone creation. To help prevent osteoporosis or the bone fractures that can happen because of osteoporosis, you may take the following actions:  If you are 37-22 years old, get at least 1,000 mg of calcium and at least 600 mg of vitamin D per day.  If you are older than age 20 but younger than age 34, get at least 1,200 mg of calcium and at least 600 mg of vitamin D per day.  If you are older than age 32, get at least 1,200 mg of calcium and at least 800 mg of vitamin D per day. Smoking and drinking excessive alcohol increase the risk of osteoporosis. Eat foods that are rich in calcium and vitamin D, and do weight-bearing exercises several times each week as directed by your health care provider. How does menopause affect my mental health? Depression may occur at any age, but it is more common as you become older. Common symptoms of depression include:  Low or sad mood.  Changes in sleep patterns.  Changes in appetite or eating patterns.  Feeling an overall lack of motivation or enjoyment of activities that you previously enjoyed.  Frequent crying spells. Talk with your health care provider if you think that you are experiencing depression. General instructions See your health care provider for regular wellness exams and vaccines. This may include:  Scheduling regular health, dental, and eye exams.  Getting and maintaining your vaccines. These include: ? Influenza vaccine. Get this vaccine each year before the flu season begins. ? Pneumonia vaccine. ? Shingles vaccine. ? Tetanus, diphtheria, and pertussis (Tdap) booster vaccine. Your health care provider may also recommend other immunizations. Tell your health care provider if you have ever been abused or do not feel safe at home. Summary  Menopause is a normal process in which your ability to get pregnant comes to an  end.  This condition causes hot flashes, night sweats, decreased interest in sex, mood swings, headaches, or lack of sleep.  Treatment for this condition may include hormone replacement therapy.  Take actions to keep yourself healthy, including exercising regularly, eating a healthy diet, watching your weight, and checking your blood pressure and blood sugar levels.  Get screened for cancer and depression. Make sure that you are up to date with all your vaccines. This information is not intended to replace advice given to you by your health care provider. Make sure you discuss any questions you have with your health care provider. Document Revised: 09/28/2018 Document Reviewed: 09/28/2018 Elsevier Patient Education  2020 [employer].

## 2020-07-01 NOTE — Progress Notes (Signed)
This visit was conducted in person.  BP 122/80 (BP Location: Right Arm, Patient Position: Sitting, Cuff Size: Large)   Pulse 84   Temp 98 F (36.7 C) (Temporal)   Ht 4' 11.75" (1.518 m)   Wt 159 lb (72.1 kg)   LMP 03/04/2009 (Approximate)   SpO2 100%   BMI 31.31 kg/m    CC: CPE Subjective:    Patient ID: Melanie Holloway, female    DOB: December 21, 1965, 54 y.o.   MRN: [id number]  HPI: Melanie Holloway is a 54 y.o. female presenting on 07/01/2020 for Annual Exam   Regularly followed by cards (Paraschos) and onc (Magrinat). Also sees nephrology ([name]) 47moand rheumatology (Jefm Bryant. Known SLE, nonischemic dilated CM, chronic sCHF, metastatic breast cancer (ER positive, PR negative, HER2 negative) found in spine by L2 vertebral biopsy. On alendronate since 04/2019. Undergoing breast cancer chemo (letrozole and fulvestrant) through onc as well as aranesp. Recent blood transfusion 05/2020.   Preventative: Colon cancer screening - colonoscopy postponed first due to pandemic and then due to recent metastatic breast cancer dx. iFOB negative 02/2020.  [place]. PKennon Rounds- normal pap smear 08/2018. Lung cancer screening - not eligible DEXA 2013 WNL - spine 0.4, hip 0.2. Fosamax started.  LMP 2009 - chemo led to early menopause Breast cancer - 2011 s/p treatment - with newly diagnosed metastatic disease 2019 Flu shot -yearly - declines today Tdap 2016 COVID vaccine - Moderna 05/2020, 06/2020 Shingrix - discussed, declines  Seat belt use discussed.  Sunscreen use discussed. No changing moles on skin.  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 Interim medical history since our last visit reviewed. Allergies and medications reviewed and updated. Outpatient Medications Prior to Visit  Medication Sig Dispense Refill  . acetaminophen (TYLENOL) 325 MG tablet Take 2 tablets (650 mg total) by mouth every 4 (four) hours as needed for headache or mild pain. 30 tablet 0  . alendronate (FOSAMAX) 70 MG tablet TAKE 1 TABLET BY MOUTH ONCE A WEEK. TAKE WITH A FULL GLASS OF WATER ON AN EMPTY STOMACH. 12 tablet 2  . Cholecalciferol (VITAMIN D) 2000 UNITS CAPS Take 1 capsule (2,000 Units total) by mouth daily. 30 capsule   . cholestyramine (QUESTRAN) 4 g packet Take 1 packet (4 g total) by mouth daily. 20 each 12  . clobetasol cream (TEMOVATE) 05.62% Apply 1 application topically 2 (two) times daily.    . hydroxychloroquine (PLAQUENIL) 200 MG tablet Take 1 tablet (200 mg total) by mouth daily.    .Marland Kitchenletrozole (FEMARA) 2.5 MG tablet Take 2.5 mg by mouth daily.    . magnesium oxide (MAG-OX) 400 MG tablet Take 400 mg by mouth daily.    . metoCLOPramide (REGLAN) 5 MG tablet Take 1 tablet (5 mg total) by mouth daily. Take 1 hour prior to verzenio (Patient taking differently: Take 5 mg by mouth daily as needed for nausea or vomiting. ) 30 tablet 1  . metoprolol succinate (TOPROL-XL) 50 MG 24 hr tablet Take 1 tablet (50 mg total) by mouth daily. Take with or immediately following  a meal. 30 tablet 0  . mycophenolate (CELLCEPT) 250 MG capsule Take 2 capsules (500 mg total) by mouth 2 (two) times daily. 60 capsule 0  . potassium chloride (K-DUR) 10 MEQ tablet Take 10 mEq by mouth every evening.     . sacubitril-valsartan (ENTRESTO) 24-26 MG Take 1 tablet by mouth daily.  180 tablet 3  . torsemide (DEMADEX) 20 MG tablet Take 2 tablets (40 mg total) by mouth 2 (two) times daily. 60 tablet 0   No facility-administered medications prior to visit.     Per HPI unless specifically  indicated in ROS section below Review of Systems  Constitutional: Negative for activity change, appetite change, chills, fatigue, fever and unexpected weight change.  HENT: Negative for hearing loss.   Eyes: Negative for visual disturbance.  Respiratory: Negative for cough, chest tightness, shortness of breath and wheezing.   Cardiovascular: Negative for chest pain, palpitations and leg swelling.  Gastrointestinal: Negative for abdominal distention, abdominal pain, blood in stool, constipation, diarrhea, nausea and vomiting.  Genitourinary: Negative for difficulty urinating and hematuria.  Musculoskeletal: Negative for arthralgias, myalgias and neck pain.  Skin: Negative for rash.  Neurological: Negative for dizziness, seizures, syncope and headaches.  Hematological: Negative for adenopathy. Does not bruise/bleed easily.  Psychiatric/Behavioral: Negative for dysphoric mood. The patient is not nervous/anxious.    Objective:  BP 122/80 (BP Location: Right Arm, Patient Position: Sitting, Cuff Size: Large)   Pulse 84   Temp 98 F (36.7 C) (Temporal)   Ht 4' 11.75" (1.518 m)   Wt 159 lb (72.1 kg)   LMP 03/04/2009 (Approximate)   SpO2 100%   BMI 31.31 kg/m   Wt Readings from Last 3 Encounters:  07/01/20 159 lb (72.1 kg)  06/05/20 158 lb 9.6 oz (71.9 kg)  05/07/20 159 lb 4.8 oz (72.3 kg)      Physical Exam Vitals and nursing note reviewed.  Constitutional:      General: She is not in acute distress.    Appearance: Normal appearance. She is well-developed. She is not ill-appearing.  HENT:     Head: Normocephalic and atraumatic.     Right Ear: Hearing, tympanic membrane, ear canal and external ear normal.     Left Ear: Hearing, tympanic membrane, ear canal and external ear normal.     Nose: Nose normal.  Eyes:     General: No scleral icterus.    Extraocular Movements: Extraocular movements intact.     Conjunctiva/sclera: Conjunctivae normal.     Pupils: Pupils are equal, round,  and reactive to light.  Neck:     Thyroid: No thyroid mass or thyromegaly.  Cardiovascular:     Rate and Rhythm: Normal rate and regular rhythm.     Pulses: Normal pulses.          Radial pulses are 2+ on the right side and 2+ on the left side.     Heart sounds: Murmur (3/6 systolic USB) heard.   Pulmonary:     Effort: Pulmonary effort is normal. No respiratory distress.     Breath sounds: Normal breath sounds. No wheezing, rhonchi or rales.  Abdominal:     General: Abdomen is flat. Bowel sounds are normal. There is no distension.     Palpations: Abdomen is soft. There is no mass.     Tenderness: There is no abdominal tenderness. There is no guarding or rebound.     Hernia: No hernia is present.  Musculoskeletal:        General: Normal  range of motion.     Cervical back: Normal range of motion and neck supple.     Right lower leg: No edema.     Left lower leg: No edema.  Lymphadenopathy:     Cervical: No cervical adenopathy.  Skin:    General: Skin is warm and dry.     Findings: No rash.  Neurological:     General: No focal deficit present.     Mental Status: She is alert and oriented to person, place, and time.     Comments: CN grossly intact, station and gait intact  Psychiatric:        Mood and Affect: Mood normal.        Behavior: Behavior normal.        Thought Content: Thought content normal.        Judgment: Judgment normal.       Lab Results  Component Value Date   CREATININE 2.18 (H) 06/05/2020   BUN 70 (H) 06/05/2020   NA 137 06/05/2020   K 3.5 06/05/2020   CL 104 06/05/2020   CO2 23 06/05/2020    Lab Results  Component Value Date   WBC 4.3 06/05/2020   HGB 6.3 (LL) 06/05/2020   HCT 18.2 (L) 06/05/2020   MCV 88.3 06/05/2020   PLT 208 06/05/2020   Lab Results  Component Value Date   CHOL 174 06/23/2019   HDL 39.00 (L) 06/23/2019   LDLCALC 106 (H) 06/23/2019   TRIG 146.0 06/23/2019   CHOLHDL 4 06/23/2019    Assessment & Plan:  This visit  occurred during the SARS-CoV-2 public health emergency.  Safety protocols were in place, including screening questions prior to the visit, additional usage of staff PPE, and extensive cleaning of exam room while observing appropriate contact time as indicated for disinfecting solutions.   Problem List Items Addressed This Visit    Vitamin D deficiency    Continue 2000 IU daily - will see if we can add levels to next blood work through onc.       Obesity, Class I, BMI 30.0-34.9 (see actual BMI)   Malignant neoplasm of female breast ([place])    Appreciate onc ([facility]) care.       Lupus nephritis, ISN/RPS class V ([place])    Appreciate rheum and nephrology care.       Lupus erythematosus tumidus    Manages with clobetasol cream PRN.       Lupus ([place])    Sees rheum/nephrology. On cellcept and hydroxychloroquine.       Heart valve regurgitation    Mild aortic and mitral regurg with mild-mod aortic valve calcification and sclerosis without stenosis on latest echocardiogram 01/2020.       Essential hypertension    Chronic, stable. Continue current regimen.       Encounter for general adult medical examination with abnormal findings - Primary    Preventative protocols reviewed and updated unless pt declined. Discussed healthy diet and lifestyle.       Dyslipidemia    Mild, off statin. Will see if we can add FLP to upcoming blood work at [facility].       CKD (chronic kidney disease) stage 4, GFR 15-29 ml/min (H[facility])    Regularly sees renal.       Chronic systolic CHF (congestive heart failure) ([place])    Continue cards f/u. On toprol XL ,entresto, torsemide.       Bone metastases ([place])    Seeing onc. Latest bone scan  overall reassuring.       Beta thalassemia (HCC)   Aortic atherosclerosis (H[facility])    Consider statin.       Anemia of chronic disease    Chronic anemia with beta thalassemia regularly followed by oncology, latest received blood transfusion.           No  orders of the defined types were placed in this encounter.  No orders of the defined types were placed in this encounter.   Patient instructions: Good to see you today You are doing well  See if you can get blood work I've ordered added on to your next blood draw at the cancer center.   Follow up plan: Return in about 1 year (around 07/01/2021) for annual exam, prior fasting for blood work.  Ria Bush, MD

## 2020-07-02 ENCOUNTER — Other Ambulatory Visit (HOSPITAL_COMMUNITY): Payer: Self-pay | Admitting: Adult Health

## 2020-07-02 ENCOUNTER — Telehealth: Payer: Self-pay | Admitting: Family Medicine

## 2020-07-02 ENCOUNTER — Ambulatory Visit: Payer: 59

## 2020-07-02 ENCOUNTER — Other Ambulatory Visit: Payer: 59

## 2020-07-02 NOTE — Assessment & Plan Note (Signed)
Preventative protocols reviewed and updated unless pt declined. Discussed healthy diet and lifestyle.  

## 2020-07-02 NOTE — Assessment & Plan Note (Signed)
Sees rheum/nephrology. On cellcept and hydroxychloroquine.

## 2020-07-02 NOTE — Assessment & Plan Note (Signed)
Seeing onc. Latest bone scan overall reassuring.

## 2020-07-02 NOTE — Assessment & Plan Note (Signed)
Regularly sees renal.

## 2020-07-02 NOTE — Assessment & Plan Note (Signed)
Mild aortic and mitral regurg with mild-mod aortic valve calcification and sclerosis without stenosis on latest echocardiogram 01/2020.

## 2020-07-02 NOTE — Assessment & Plan Note (Signed)
Continue cards f/u. On toprol XL ,entresto, torsemide.

## 2020-07-02 NOTE — Assessment & Plan Note (Signed)
Mild, off statin. Will see if we can add FLP to upcoming blood work at [facility].

## 2020-07-02 NOTE — Assessment & Plan Note (Signed)
Continue 2000 IU daily - will see if we can add levels to next blood work through onc.

## 2020-07-02 NOTE — Assessment & Plan Note (Signed)
Appreciate rheum and nephrology care.  

## 2020-07-02 NOTE — Assessment & Plan Note (Signed)
Chronic, stable. Continue current regimen. 

## 2020-07-02 NOTE — Assessment & Plan Note (Signed)
Manages with clobetasol cream PRN.

## 2020-07-02 NOTE — Telephone Encounter (Signed)
Good morning. I saw this patient for a recent physical.  In order to save her from getting an extra needle stick, I wanted to see if we could add FLP (E78.5) and vit D (E55.9) to upcoming labwork through the cancer center? If not let me know and I will have her come in for fasting labs at our office.  Thank you!

## 2020-07-02 NOTE — Assessment & Plan Note (Signed)
Appreciate onc ([facility]rinat) care.

## 2020-07-02 NOTE — Assessment & Plan Note (Signed)
Chronic anemia with beta thalassemia regularly followed by oncology, latest received blood transfusion.

## 2020-07-02 NOTE — Assessment & Plan Note (Signed)
Consider statin.  ?

## 2020-07-04 ENCOUNTER — Other Ambulatory Visit (HOSPITAL_COMMUNITY): Payer: Self-pay | Admitting: Adult Health

## 2020-07-04 DIAGNOSIS — Z13220 Encounter for screening for lipoid disorders: Secondary | ICD-10-CM

## 2020-07-04 DIAGNOSIS — E559 Vitamin D deficiency, unspecified: Secondary | ICD-10-CM

## 2020-07-04 NOTE — Telephone Encounter (Signed)
I placed the orders and will forward to you once they result.  Happy to help.  Mendel Ryder

## 2020-07-04 NOTE — Progress Notes (Signed)
Lab orders placed.  

## 2020-07-04 NOTE — Telephone Encounter (Signed)
Thank you :)

## 2020-07-09 ENCOUNTER — Other Ambulatory Visit: Payer: Self-pay

## 2020-07-09 ENCOUNTER — Encounter: Payer: Self-pay | Admitting: Family Medicine

## 2020-07-09 ENCOUNTER — Other Ambulatory Visit: Payer: No Typology Code available for payment source

## 2020-07-09 ENCOUNTER — Telehealth: Payer: Self-pay

## 2020-07-09 ENCOUNTER — Other Ambulatory Visit: Payer: Self-pay | Admitting: Family Medicine

## 2020-07-09 ENCOUNTER — Inpatient Hospital Stay: Payer: No Typology Code available for payment source | Attending: Oncology

## 2020-07-09 ENCOUNTER — Ambulatory Visit: Payer: No Typology Code available for payment source

## 2020-07-09 ENCOUNTER — Inpatient Hospital Stay: Payer: No Typology Code available for payment source

## 2020-07-09 VITALS — BP 130/78

## 2020-07-09 DIAGNOSIS — N189 Chronic kidney disease, unspecified: Secondary | ICD-10-CM | POA: Diagnosis present

## 2020-07-09 DIAGNOSIS — Z79810 Long term (current) use of selective estrogen receptor modulators (SERMs): Secondary | ICD-10-CM | POA: Insufficient documentation

## 2020-07-09 DIAGNOSIS — Z79811 Long term (current) use of aromatase inhibitors: Secondary | ICD-10-CM | POA: Diagnosis not present

## 2020-07-09 DIAGNOSIS — C50011 Malignant neoplasm of nipple and areola, right female breast: Secondary | ICD-10-CM

## 2020-07-09 DIAGNOSIS — C771 Secondary and unspecified malignant neoplasm of intrathoracic lymph nodes: Secondary | ICD-10-CM

## 2020-07-09 DIAGNOSIS — Z923 Personal history of irradiation: Secondary | ICD-10-CM | POA: Insufficient documentation

## 2020-07-09 DIAGNOSIS — M3214 Glomerular disease in systemic lupus erythematosus: Secondary | ICD-10-CM

## 2020-07-09 DIAGNOSIS — I2510 Atherosclerotic heart disease of native coronary artery without angina pectoris: Secondary | ICD-10-CM | POA: Diagnosis not present

## 2020-07-09 DIAGNOSIS — Z853 Personal history of malignant neoplasm of breast: Secondary | ICD-10-CM | POA: Diagnosis not present

## 2020-07-09 DIAGNOSIS — I313 Pericardial effusion (noninflammatory): Secondary | ICD-10-CM | POA: Insufficient documentation

## 2020-07-09 DIAGNOSIS — Z13220 Encounter for screening for lipoid disorders: Secondary | ICD-10-CM

## 2020-07-09 DIAGNOSIS — M329 Systemic lupus erythematosus, unspecified: Secondary | ICD-10-CM

## 2020-07-09 DIAGNOSIS — D631 Anemia in chronic kidney disease: Secondary | ICD-10-CM | POA: Insufficient documentation

## 2020-07-09 DIAGNOSIS — C7951 Secondary malignant neoplasm of bone: Secondary | ICD-10-CM

## 2020-07-09 DIAGNOSIS — D638 Anemia in other chronic diseases classified elsewhere: Secondary | ICD-10-CM

## 2020-07-09 DIAGNOSIS — Z9012 Acquired absence of left breast and nipple: Secondary | ICD-10-CM | POA: Diagnosis not present

## 2020-07-09 DIAGNOSIS — I130 Hypertensive heart and chronic kidney disease with heart failure and stage 1 through stage 4 chronic kidney disease, or unspecified chronic kidney disease: Secondary | ICD-10-CM | POA: Diagnosis not present

## 2020-07-09 DIAGNOSIS — Z809 Family history of malignant neoplasm, unspecified: Secondary | ICD-10-CM | POA: Diagnosis not present

## 2020-07-09 DIAGNOSIS — Z5111 Encounter for antineoplastic chemotherapy: Secondary | ICD-10-CM | POA: Insufficient documentation

## 2020-07-09 DIAGNOSIS — D61818 Other pancytopenia: Secondary | ICD-10-CM

## 2020-07-09 DIAGNOSIS — C50512 Malignant neoplasm of lower-outer quadrant of left female breast: Secondary | ICD-10-CM

## 2020-07-09 DIAGNOSIS — Z803 Family history of malignant neoplasm of breast: Secondary | ICD-10-CM | POA: Insufficient documentation

## 2020-07-09 DIAGNOSIS — C50912 Malignant neoplasm of unspecified site of left female breast: Secondary | ICD-10-CM | POA: Insufficient documentation

## 2020-07-09 DIAGNOSIS — D649 Anemia, unspecified: Secondary | ICD-10-CM | POA: Diagnosis not present

## 2020-07-09 DIAGNOSIS — I700 Atherosclerosis of aorta: Secondary | ICD-10-CM | POA: Insufficient documentation

## 2020-07-09 DIAGNOSIS — N179 Acute kidney failure, unspecified: Secondary | ICD-10-CM

## 2020-07-09 DIAGNOSIS — D561 Beta thalassemia: Secondary | ICD-10-CM

## 2020-07-09 DIAGNOSIS — Z170 Estrogen receptor positive status [ER+]: Secondary | ICD-10-CM

## 2020-07-09 LAB — COMPREHENSIVE METABOLIC PANEL
ALT: 11 U/L (ref 0–44)
AST: 17 U/L (ref 15–41)
Albumin: 3.5 g/dL (ref 3.5–5.0)
Alkaline Phosphatase: 86 U/L (ref 38–126)
Anion gap: 9 (ref 5–15)
BUN: 69 mg/dL — ABNORMAL HIGH (ref 6–20)
CO2: 24 mmol/L (ref 22–32)
Calcium: 9.1 mg/dL (ref 8.9–10.3)
Chloride: 103 mmol/L (ref 98–111)
Creatinine, Ser: 1.82 mg/dL — ABNORMAL HIGH (ref 0.44–1.00)
GFR calc Af Amer: 36 mL/min — ABNORMAL LOW (ref >60)
GFR calc non Af Amer: 31 mL/min — ABNORMAL LOW (ref >60)
Glucose, Bld: 92 mg/dL (ref 70–99)
Potassium: 4 mmol/L (ref 3.5–5.1)
Sodium: 136 mmol/L (ref 135–145)
Total Bilirubin: 0.5 mg/dL (ref 0.3–1.2)
Total Protein: 7.3 g/dL (ref 6.5–8.1)

## 2020-07-09 LAB — CBC WITH DIFFERENTIAL/PLATELET
Abs Immature Granulocytes: 0.04 10*3/uL (ref 0.00–0.07)
Basophils Absolute: 0 10*3/uL (ref 0.0–0.1)
Basophils Relative: 0 %
Eosinophils Absolute: 0 10*3/uL (ref 0.0–0.5)
Eosinophils Relative: 0 %
HCT: 19.7 % — ABNORMAL LOW (ref 36.0–46.0)
Hemoglobin: 6.9 g/dL — CL (ref 12.0–15.0)
Immature Granulocytes: 1 %
Lymphocytes Relative: 15 %
Lymphs Abs: 0.8 10*3/uL (ref 0.7–4.0)
MCH: 30.8 pg (ref 26.0–34.0)
MCHC: 35 g/dL (ref 30.0–36.0)
MCV: 87.9 fL (ref 80.0–100.0)
Monocytes Absolute: 0.5 10*3/uL (ref 0.1–1.0)
Monocytes Relative: 10 %
Neutro Abs: 3.7 10*3/uL (ref 1.7–7.7)
Neutrophils Relative %: 74 %
Platelets: 201 10*3/uL (ref 150–400)
RBC: 2.24 MIL/uL — ABNORMAL LOW (ref 3.87–5.11)
RDW: 12.8 % (ref 11.5–15.5)
WBC: 5.1 10*3/uL (ref 4.0–10.5)
nRBC: 0 % (ref 0.0–0.2)

## 2020-07-09 LAB — LIPID PANEL
Cholesterol: 156 (ref 0–200)
HDL: 42 (ref 35–70)
LDL Cholesterol: 89
Triglycerides: 140 (ref 40–160)

## 2020-07-09 LAB — SAMPLE TO BLOOD BANK

## 2020-07-09 LAB — IRON AND TIBC
Iron: 166 ug/dL — ABNORMAL HIGH (ref 41–142)
Saturation Ratios: 73 % — ABNORMAL HIGH (ref 21–57)
TIBC: 229 ug/dL — ABNORMAL LOW (ref 236–444)
UIBC: 63 ug/dL — ABNORMAL LOW (ref 120–384)

## 2020-07-09 LAB — RETICULOCYTES
Immature Retic Fract: 13.3 % (ref 2.3–15.9)
RBC.: 2.15 MIL/uL — ABNORMAL LOW (ref 3.87–5.11)
Retic Count, Absolute: 52.7 10*3/uL (ref 19.0–186.0)
Retic Ct Pct: 2.5 % (ref 0.4–3.1)

## 2020-07-09 LAB — VITAMIN D 25 HYDROXY (VIT D DEFICIENCY, FRACTURES): Vit D, 25-Hydroxy: 42.2

## 2020-07-09 LAB — FERRITIN: Ferritin: 3651 ng/mL — ABNORMAL HIGH (ref 11–307)

## 2020-07-09 LAB — PREPARE RBC (CROSSMATCH)

## 2020-07-09 MED ORDER — DARBEPOETIN ALFA 500 MCG/ML IJ SOSY
500.0000 ug | PREFILLED_SYRINGE | Freq: Once | INTRAMUSCULAR | Status: AC
  Administered 2020-07-09: 500 ug via SUBCUTANEOUS

## 2020-07-09 MED ORDER — FULVESTRANT 250 MG/5ML IM SOLN
INTRAMUSCULAR | Status: AC
  Filled 2020-07-09: qty 10

## 2020-07-09 MED ORDER — DARBEPOETIN ALFA 500 MCG/ML IJ SOSY
PREFILLED_SYRINGE | INTRAMUSCULAR | Status: AC
  Filled 2020-07-09: qty 1

## 2020-07-09 MED ORDER — FULVESTRANT 250 MG/5ML IM SOLN
500.0000 mg | Freq: Once | INTRAMUSCULAR | Status: AC
  Administered 2020-07-09: 500 mg via INTRAMUSCULAR

## 2020-07-09 NOTE — Patient Instructions (Signed)
Darbepoetin Alfa injection What is this medicine? DARBEPOETIN ALFA (dar be POE e tin AL fa) helps your body make more red blood cells. It is used to treat anemia caused by chronic kidney failure and chemotherapy. This medicine may be used for other purposes; ask your health care provider or pharmacist if you have questions. COMMON BRAND NAME(S): Aranesp What should I tell my health care provider before I take this medicine? They need to know if you have any of these conditions:  blood clotting disorders or history of blood clots  cancer patient not on chemotherapy  cystic fibrosis  heart disease, such as angina, heart failure, or a history of a heart attack  hemoglobin level of 12 g/dL or greater  high blood pressure  low levels of folate, iron, or vitamin B12  seizures  an unusual or allergic reaction to darbepoetin, erythropoietin, albumin, hamster proteins, latex, other medicines, foods, dyes, or preservatives  pregnant or trying to get pregnant  breast-feeding How should I use this medicine? This medicine is for injection into a vein or under the skin. It is usually given by a health care professional in a hospital or clinic setting. If you get this medicine at home, you will be taught how to prepare and give this medicine. Use exactly as directed. Take your medicine at regular intervals. Do not take your medicine more often than directed. It is important that you put your used needles and syringes in a special sharps container. Do not put them in a trash can. If you do not have a sharps container, call your pharmacist or healthcare provider to get one. A special MedGuide will be given to you by the pharmacist with each prescription and refill. Be sure to read this information carefully each time. Talk to your pediatrician regarding the use of this medicine in children. While this medicine may be used in children as young as 1 month of age for selected conditions, precautions do  apply. Overdosage: If you think you have taken too much of this medicine contact a poison control center or emergency room at once. NOTE: This medicine is only for you. Do not share this medicine with others. What if I miss a dose? If you miss a dose, take it as soon as you can. If it is almost time for your next dose, take only that dose. Do not take double or extra doses. What may interact with this medicine? Do not take this medicine with any of the following medications:  epoetin alfa This list may not describe all possible interactions. Give your health care provider a list of all the medicines, herbs, non-prescription drugs, or dietary supplements you use. Also tell them if you smoke, drink alcohol, or use illegal drugs. Some items may interact with your medicine. What should I watch for while using this medicine? Your condition will be monitored carefully while you are receiving this medicine. You may need blood work done while you are taking this medicine. This medicine may cause a decrease in vitamin B6. You should make sure that you get enough vitamin B6 while you are taking this medicine. Discuss the foods you eat and the vitamins you take with your health care professional. What side effects may I notice from receiving this medicine? Side effects that you should report to your doctor or health care professional as soon as possible:  allergic reactions like skin rash, itching or hives, swelling of the face, lips, or tongue  breathing problems  changes in   vision  chest pain  confusion, trouble speaking or understanding  feeling faint or lightheaded, falls  high blood pressure  muscle aches or pains  pain, swelling, warmth in the leg  rapid weight gain  severe headaches  sudden numbness or weakness of the face, arm or leg  trouble walking, dizziness, loss of balance or coordination  seizures (convulsions)  swelling of the ankles, feet, hands  unusually weak or  tired Side effects that usually do not require medical attention (report to your doctor or health care professional if they continue or are bothersome):  diarrhea  fever, chills (flu-like symptoms)  headaches  nausea, vomiting  redness, stinging, or swelling at site where injected This list may not describe all possible side effects. Call your doctor for medical advice about side effects. You may report side effects to FDA at 1-800-FDA-1088. Where should I keep my medicine? Keep out of the reach of children. Store in a refrigerator between 2 and 8 degrees C (36 and 46 degrees F). Do not freeze. Do not shake. Throw away any unused portion if using a single-dose vial. Throw away any unused medicine after the expiration date. NOTE: This sheet is a summary. It may not cover all possible information. If you have questions about this medicine, talk to your doctor, pharmacist, or health care provider.  2020 Elsevier/Gold Standard (2017-10-20 16:44:20) Fulvestrant injection What is this medicine? FULVESTRANT (ful VES trant) blocks the effects of estrogen. It is used to treat breast cancer. This medicine may be used for other purposes; ask your health care provider or pharmacist if you have questions. COMMON BRAND NAME(S): FASLODEX What should I tell my health care provider before I take this medicine? They need to know if you have any of these conditions:  bleeding disorders  liver disease  low blood counts, like low white cell, platelet, or red cell counts  an unusual or allergic reaction to fulvestrant, other medicines, foods, dyes, or preservatives  pregnant or trying to get pregnant  breast-feeding How should I use this medicine? This medicine is for injection into a muscle. It is usually given by a health care professional in a hospital or clinic setting. Talk to your pediatrician regarding the use of this medicine in children. Special care may be needed. Overdosage: If you  think you have taken too much of this medicine contact a poison control center or emergency room at once. NOTE: This medicine is only for you. Do not share this medicine with others. What if I miss a dose? It is important not to miss your dose. Call your doctor or health care professional if you are unable to keep an appointment. What may interact with this medicine?  medicines that treat or prevent blood clots like warfarin, enoxaparin, dalteparin, apixaban, dabigatran, and rivaroxaban This list may not describe all possible interactions. Give your health care provider a list of all the medicines, herbs, non-prescription drugs, or dietary supplements you use. Also tell them if you smoke, drink alcohol, or use illegal drugs. Some items may interact with your medicine. What should I watch for while using this medicine? Your condition will be monitored carefully while you are receiving this medicine. You will need important blood work done while you are taking this medicine. Do not become pregnant while taking this medicine or for at least 1 year after stopping it. Women of child-bearing potential will need to have a negative pregnancy test before starting this medicine. Women should inform their doctor if they wish  to become pregnant or think they might be pregnant. There is a potential for serious side effects to an unborn child. Men should inform their doctors if they wish to father a child. This medicine may lower sperm counts. Talk to your health care professional or pharmacist for more information. Do not breast-feed an infant while taking this medicine or for 1 year after the last dose. What side effects may I notice from receiving this medicine? Side effects that you should report to your doctor or health care professional as soon as possible:  allergic reactions like skin rash, itching or hives, swelling of the face, lips, or tongue  feeling faint or lightheaded, falls  pain, tingling,  numbness, or weakness in the legs  signs and symptoms of infection like fever or chills; cough; flu-like symptoms; sore throat  vaginal bleeding Side effects that usually do not require medical attention (report to your doctor or health care professional if they continue or are bothersome):  aches, pains  constipation  diarrhea  headache  hot flashes  nausea, vomiting  pain at site where injected  stomach pain This list may not describe all possible side effects. Call your doctor for medical advice about side effects. You may report side effects to FDA at 1-800-FDA-1088. Where should I keep my medicine? This drug is given in a hospital or clinic and will not be stored at home. NOTE: This sheet is a summary. It may not cover all possible information. If you have questions about this medicine, talk to your doctor, pharmacist, or health care provider.  2020 Elsevier/Gold Standard (2018-01-13 11:34:41)

## 2020-07-09 NOTE — Telephone Encounter (Signed)
Critical Value: Hgb 6.9 Sample to blood bank Dr. Jana Hakim notified

## 2020-07-09 NOTE — Telephone Encounter (Signed)
Spoke with pt, as blood bank called stating pt's blood is too complex to have prepared by 0900 07/10/20. Pt was supposed to have 2U PRBC 07/10/20 at 0900; however pt is now aware she needs to be on standby for blood bank to receive appropriate type of blood. Pt understands she may get one unit tomorrow afternoon, and 2nd unit Thursday, or may not be able to receive both units until Thursday. Pt verbalized thanks and understanding.

## 2020-07-10 ENCOUNTER — Inpatient Hospital Stay: Payer: No Typology Code available for payment source

## 2020-07-10 ENCOUNTER — Other Ambulatory Visit: Payer: Self-pay

## 2020-07-10 DIAGNOSIS — C50011 Malignant neoplasm of nipple and areola, right female breast: Secondary | ICD-10-CM

## 2020-07-10 DIAGNOSIS — D649 Anemia, unspecified: Secondary | ICD-10-CM | POA: Diagnosis not present

## 2020-07-10 DIAGNOSIS — D509 Iron deficiency anemia, unspecified: Secondary | ICD-10-CM

## 2020-07-10 MED ORDER — SODIUM CHLORIDE 0.9% IV SOLUTION
250.0000 mL | Freq: Once | INTRAVENOUS | Status: AC
  Administered 2020-07-10: 250 mL via INTRAVENOUS
  Filled 2020-07-10: qty 250

## 2020-07-10 MED ORDER — DIPHENHYDRAMINE HCL 25 MG PO CAPS
ORAL_CAPSULE | ORAL | Status: AC
  Filled 2020-07-10: qty 1

## 2020-07-10 MED ORDER — DIPHENHYDRAMINE HCL 25 MG PO CAPS
25.0000 mg | ORAL_CAPSULE | Freq: Once | ORAL | Status: AC
  Administered 2020-07-10: 25 mg via ORAL

## 2020-07-10 MED ORDER — ACETAMINOPHEN 325 MG PO TABS
ORAL_TABLET | ORAL | Status: AC
  Filled 2020-07-10: qty 2

## 2020-07-10 MED ORDER — ACETAMINOPHEN 325 MG PO TABS
650.0000 mg | ORAL_TABLET | Freq: Once | ORAL | Status: AC
  Administered 2020-07-10: 650 mg via ORAL

## 2020-07-10 NOTE — Patient Instructions (Signed)

## 2020-07-11 ENCOUNTER — Other Ambulatory Visit: Payer: Self-pay

## 2020-07-11 ENCOUNTER — Inpatient Hospital Stay: Payer: No Typology Code available for payment source

## 2020-07-11 DIAGNOSIS — D509 Iron deficiency anemia, unspecified: Secondary | ICD-10-CM

## 2020-07-11 DIAGNOSIS — D649 Anemia, unspecified: Secondary | ICD-10-CM | POA: Diagnosis not present

## 2020-07-11 MED ORDER — DIPHENHYDRAMINE HCL 25 MG PO CAPS
25.0000 mg | ORAL_CAPSULE | Freq: Once | ORAL | Status: AC
  Administered 2020-07-11: 25 mg via ORAL

## 2020-07-11 MED ORDER — SODIUM CHLORIDE 0.9% IV SOLUTION
250.0000 mL | Freq: Once | INTRAVENOUS | Status: DC
  Filled 2020-07-11: qty 250

## 2020-07-11 MED ORDER — ACETAMINOPHEN 325 MG PO TABS
650.0000 mg | ORAL_TABLET | Freq: Once | ORAL | Status: AC
  Administered 2020-07-11: 650 mg via ORAL

## 2020-07-11 MED ORDER — ACETAMINOPHEN 325 MG PO TABS
ORAL_TABLET | ORAL | Status: AC
  Filled 2020-07-11: qty 2

## 2020-07-11 MED ORDER — DIPHENHYDRAMINE HCL 25 MG PO CAPS
ORAL_CAPSULE | ORAL | Status: AC
  Filled 2020-07-11: qty 1

## 2020-07-11 NOTE — Patient Instructions (Signed)

## 2020-07-12 LAB — TYPE AND SCREEN
ABO/RH(D): B POS
Antibody Screen: POSITIVE
DAT, IgG: POSITIVE
Unit division: 0
Unit division: 0

## 2020-07-12 LAB — VITAMIN D 25 HYDROXY (VIT D DEFICIENCY, FRACTURES): Vit D, 25-Hydroxy: 42.2 ng/mL (ref 30.0–100.0)

## 2020-07-12 LAB — BPAM RBC
Blood Product Expiration Date: 202110202359
Blood Product Expiration Date: 202110202359
ISSUE DATE / TIME: 202109221333
ISSUE DATE / TIME: 202109231541
Unit Type and Rh: 5100
Unit Type and Rh: 5100

## 2020-07-12 LAB — LIPID PANEL W/O CHOL/HDL RATIO
Cholesterol, Total: 156 mg/dL (ref 100–199)
HDL: 42 mg/dL (ref >39)
LDL Chol Calc (NIH): 89 mg/dL (ref 0–99)
Triglycerides: 140 mg/dL (ref 0–149)
VLDL Cholesterol Cal: 25 mg/dL (ref 5–40)

## 2020-07-12 LAB — SPECIMEN STATUS REPORT

## 2020-07-30 ENCOUNTER — Other Ambulatory Visit: Payer: 59

## 2020-07-30 ENCOUNTER — Encounter: Payer: Self-pay | Admitting: Family Medicine

## 2020-07-30 ENCOUNTER — Ambulatory Visit: Payer: 59

## 2020-08-07 ENCOUNTER — Other Ambulatory Visit: Payer: No Typology Code available for payment source

## 2020-08-07 ENCOUNTER — Ambulatory Visit: Payer: No Typology Code available for payment source | Admitting: Adult Health

## 2020-08-07 ENCOUNTER — Inpatient Hospital Stay: Payer: No Typology Code available for payment source

## 2020-08-07 ENCOUNTER — Inpatient Hospital Stay: Payer: No Typology Code available for payment source | Attending: Oncology

## 2020-08-07 ENCOUNTER — Inpatient Hospital Stay (HOSPITAL_BASED_OUTPATIENT_CLINIC_OR_DEPARTMENT_OTHER): Payer: No Typology Code available for payment source | Admitting: Medical

## 2020-08-07 ENCOUNTER — Other Ambulatory Visit: Payer: Self-pay

## 2020-08-07 ENCOUNTER — Ambulatory Visit: Payer: No Typology Code available for payment source

## 2020-08-07 VITALS — BP 142/73 | HR 81 | Temp 97.0°F | Resp 18 | Ht 59.0 in | Wt 154.9 lb

## 2020-08-07 DIAGNOSIS — Z5111 Encounter for antineoplastic chemotherapy: Secondary | ICD-10-CM | POA: Diagnosis present

## 2020-08-07 DIAGNOSIS — N184 Chronic kidney disease, stage 4 (severe): Secondary | ICD-10-CM

## 2020-08-07 DIAGNOSIS — Z923 Personal history of irradiation: Secondary | ICD-10-CM | POA: Insufficient documentation

## 2020-08-07 DIAGNOSIS — C50912 Malignant neoplasm of unspecified site of left female breast: Secondary | ICD-10-CM

## 2020-08-07 DIAGNOSIS — I2510 Atherosclerotic heart disease of native coronary artery without angina pectoris: Secondary | ICD-10-CM | POA: Diagnosis not present

## 2020-08-07 DIAGNOSIS — Z9012 Acquired absence of left breast and nipple: Secondary | ICD-10-CM | POA: Diagnosis not present

## 2020-08-07 DIAGNOSIS — Z79810 Long term (current) use of selective estrogen receptor modulators (SERMs): Secondary | ICD-10-CM | POA: Diagnosis not present

## 2020-08-07 DIAGNOSIS — Z170 Estrogen receptor positive status [ER+]: Secondary | ICD-10-CM

## 2020-08-07 DIAGNOSIS — I313 Pericardial effusion (noninflammatory): Secondary | ICD-10-CM | POA: Diagnosis not present

## 2020-08-07 DIAGNOSIS — C7951 Secondary malignant neoplasm of bone: Secondary | ICD-10-CM

## 2020-08-07 DIAGNOSIS — Z803 Family history of malignant neoplasm of breast: Secondary | ICD-10-CM | POA: Diagnosis not present

## 2020-08-07 DIAGNOSIS — C771 Secondary and unspecified malignant neoplasm of intrathoracic lymph nodes: Secondary | ICD-10-CM

## 2020-08-07 DIAGNOSIS — D638 Anemia in other chronic diseases classified elsewhere: Secondary | ICD-10-CM

## 2020-08-07 DIAGNOSIS — I130 Hypertensive heart and chronic kidney disease with heart failure and stage 1 through stage 4 chronic kidney disease, or unspecified chronic kidney disease: Secondary | ICD-10-CM | POA: Diagnosis not present

## 2020-08-07 DIAGNOSIS — M3214 Glomerular disease in systemic lupus erythematosus: Secondary | ICD-10-CM | POA: Insufficient documentation

## 2020-08-07 DIAGNOSIS — D631 Anemia in chronic kidney disease: Secondary | ICD-10-CM | POA: Insufficient documentation

## 2020-08-07 DIAGNOSIS — Z79811 Long term (current) use of aromatase inhibitors: Secondary | ICD-10-CM | POA: Diagnosis not present

## 2020-08-07 DIAGNOSIS — C50011 Malignant neoplasm of nipple and areola, right female breast: Secondary | ICD-10-CM

## 2020-08-07 DIAGNOSIS — Z809 Family history of malignant neoplasm, unspecified: Secondary | ICD-10-CM | POA: Insufficient documentation

## 2020-08-07 DIAGNOSIS — M329 Systemic lupus erythematosus, unspecified: Secondary | ICD-10-CM

## 2020-08-07 DIAGNOSIS — I700 Atherosclerosis of aorta: Secondary | ICD-10-CM | POA: Diagnosis not present

## 2020-08-07 DIAGNOSIS — C50512 Malignant neoplasm of lower-outer quadrant of left female breast: Secondary | ICD-10-CM

## 2020-08-07 DIAGNOSIS — N179 Acute kidney failure, unspecified: Secondary | ICD-10-CM

## 2020-08-07 DIAGNOSIS — D61818 Other pancytopenia: Secondary | ICD-10-CM

## 2020-08-07 DIAGNOSIS — D561 Beta thalassemia: Secondary | ICD-10-CM

## 2020-08-07 LAB — CBC WITH DIFFERENTIAL/PLATELET
Abs Immature Granulocytes: 0.04 10*3/uL (ref 0.00–0.07)
Basophils Absolute: 0 10*3/uL (ref 0.0–0.1)
Basophils Relative: 0 %
Eosinophils Absolute: 0 10*3/uL (ref 0.0–0.5)
Eosinophils Relative: 1 %
HCT: 28.3 % — ABNORMAL LOW (ref 36.0–46.0)
Hemoglobin: 9.4 g/dL — ABNORMAL LOW (ref 12.0–15.0)
Immature Granulocytes: 1 %
Lymphocytes Relative: 21 %
Lymphs Abs: 0.9 10*3/uL (ref 0.7–4.0)
MCH: 29.9 pg (ref 26.0–34.0)
MCHC: 33.2 g/dL (ref 30.0–36.0)
MCV: 90.1 fL (ref 80.0–100.0)
Monocytes Absolute: 0.5 10*3/uL (ref 0.1–1.0)
Monocytes Relative: 11 %
Neutro Abs: 2.8 10*3/uL (ref 1.7–7.7)
Neutrophils Relative %: 66 %
Platelets: 199 10*3/uL (ref 150–400)
RBC: 3.14 MIL/uL — ABNORMAL LOW (ref 3.87–5.11)
RDW: 13.6 % (ref 11.5–15.5)
WBC: 4.3 10*3/uL (ref 4.0–10.5)
nRBC: 0 % (ref 0.0–0.2)

## 2020-08-07 LAB — COMPREHENSIVE METABOLIC PANEL
ALT: 10 U/L (ref 0–44)
AST: 16 U/L (ref 15–41)
Albumin: 3.4 g/dL — ABNORMAL LOW (ref 3.5–5.0)
Alkaline Phosphatase: 99 U/L (ref 38–126)
Anion gap: 8 (ref 5–15)
BUN: 66 mg/dL — ABNORMAL HIGH (ref 6–20)
CO2: 27 mmol/L (ref 22–32)
Calcium: 9.6 mg/dL (ref 8.9–10.3)
Chloride: 105 mmol/L (ref 98–111)
Creatinine, Ser: 1.86 mg/dL — ABNORMAL HIGH (ref 0.44–1.00)
GFR, Estimated: 30 mL/min — ABNORMAL LOW (ref >60)
Glucose, Bld: 96 mg/dL (ref 70–99)
Potassium: 3.6 mmol/L (ref 3.5–5.1)
Sodium: 140 mmol/L (ref 135–145)
Total Bilirubin: 0.3 mg/dL (ref 0.3–1.2)
Total Protein: 7.5 g/dL (ref 6.5–8.1)

## 2020-08-07 LAB — SAMPLE TO BLOOD BANK

## 2020-08-07 MED ORDER — FULVESTRANT 250 MG/5ML IM SOLN
INTRAMUSCULAR | Status: AC
  Filled 2020-08-07: qty 10

## 2020-08-07 MED ORDER — DARBEPOETIN ALFA 500 MCG/ML IJ SOSY
500.0000 ug | PREFILLED_SYRINGE | Freq: Once | INTRAMUSCULAR | Status: AC
  Administered 2020-08-07: 500 ug via SUBCUTANEOUS

## 2020-08-07 MED ORDER — FULVESTRANT 250 MG/5ML IM SOLN
500.0000 mg | Freq: Once | INTRAMUSCULAR | Status: AC
  Administered 2020-08-07: 500 mg via INTRAMUSCULAR

## 2020-08-07 MED ORDER — DARBEPOETIN ALFA 500 MCG/ML IJ SOSY
PREFILLED_SYRINGE | INTRAMUSCULAR | Status: AC
  Filled 2020-08-07: qty 1

## 2020-08-07 NOTE — Patient Instructions (Signed)
Fulvestrant injection What is this medicine? FULVESTRANT (ful VES trant) blocks the effects of estrogen. It is used to treat breast cancer. This medicine may be used for other purposes; ask your health care provider or pharmacist if you have questions. COMMON BRAND NAME(S): FASLODEX What should I tell my health care provider before I take this medicine? They need to know if you have any of these conditions:  bleeding disorders  liver disease  low blood counts, like low white cell, platelet, or red cell counts  an unusual or allergic reaction to fulvestrant, other medicines, foods, dyes, or preservatives  pregnant or trying to get pregnant  breast-feeding How should I use this medicine? This medicine is for injection into a muscle. It is usually given by a health care professional in a hospital or clinic setting. Talk to your pediatrician regarding the use of this medicine in children. Special care may be needed. Overdosage: If you think you have taken too much of this medicine contact a poison control center or emergency room at once. NOTE: This medicine is only for you. Do not share this medicine with others. What if I miss a dose? It is important not to miss your dose. Call your doctor or health care professional if you are unable to keep an appointment. What may interact with this medicine?  medicines that treat or prevent blood clots like warfarin, enoxaparin, dalteparin, apixaban, dabigatran, and rivaroxaban This list may not describe all possible interactions. Give your health care provider a list of all the medicines, herbs, non-prescription drugs, or dietary supplements you use. Also tell them if you smoke, drink alcohol, or use illegal drugs. Some items may interact with your medicine. What should I watch for while using this medicine? Your condition will be monitored carefully while you are receiving this medicine. You will need important blood work done while you are taking  this medicine. Do not become pregnant while taking this medicine or for at least 1 year after stopping it. Women of child-bearing potential will need to have a negative pregnancy test before starting this medicine. Women should inform their doctor if they wish to become pregnant or think they might be pregnant. There is a potential for serious side effects to an unborn child. Men should inform their doctors if they wish to father a child. This medicine may lower sperm counts. Talk to your health care professional or pharmacist for more information. Do not breast-feed an infant while taking this medicine or for 1 year after the last dose. What side effects may I notice from receiving this medicine? Side effects that you should report to your doctor or health care professional as soon as possible:  allergic reactions like skin rash, itching or hives, swelling of the face, lips, or tongue  feeling faint or lightheaded, falls  pain, tingling, numbness, or weakness in the legs  signs and symptoms of infection like fever or chills; cough; flu-like symptoms; sore throat  vaginal bleeding Side effects that usually do not require medical attention (report to your doctor or health care professional if they continue or are bothersome):  aches, pains  constipation  diarrhea  headache  hot flashes  nausea, vomiting  pain at site where injected  stomach pain This list may not describe all possible side effects. Call your doctor for medical advice about side effects. You may report side effects to FDA at 1-800-FDA-1088. Where should I keep my medicine? This drug is given in a hospital or clinic and will   not be stored at home. NOTE: This sheet is a summary. It may not cover all possible information. If you have questions about this medicine, talk to your doctor, pharmacist, or health care provider.  2020 Elsevier/Gold Standard (2018-01-13 11:34:41) Darbepoetin Alfa injection What is this  medicine? DARBEPOETIN ALFA (dar be POE e tin AL fa) helps your body make more red blood cells. It is used to treat anemia caused by chronic kidney failure and chemotherapy. This medicine may be used for other purposes; ask your health care provider or pharmacist if you have questions. COMMON BRAND NAME(S): Aranesp What should I tell my health care provider before I take this medicine? They need to know if you have any of these conditions:  blood clotting disorders or history of blood clots  cancer patient not on chemotherapy  cystic fibrosis  heart disease, such as angina, heart failure, or a history of a heart attack  hemoglobin level of 12 g/dL or greater  high blood pressure  low levels of folate, iron, or vitamin B12  seizures  an unusual or allergic reaction to darbepoetin, erythropoietin, albumin, hamster proteins, latex, other medicines, foods, dyes, or preservatives  pregnant or trying to get pregnant  breast-feeding How should I use this medicine? This medicine is for injection into a vein or under the skin. It is usually given by a health care professional in a hospital or clinic setting. If you get this medicine at home, you will be taught how to prepare and give this medicine. Use exactly as directed. Take your medicine at regular intervals. Do not take your medicine more often than directed. It is important that you put your used needles and syringes in a special sharps container. Do not put them in a trash can. If you do not have a sharps container, call your pharmacist or healthcare provider to get one. A special MedGuide will be given to you by the pharmacist with each prescription and refill. Be sure to read this information carefully each time. Talk to your pediatrician regarding the use of this medicine in children. While this medicine may be used in children as young as 7 month of age for selected conditions, precautions do apply. Overdosage: If you think you  have taken too much of this medicine contact a poison control center or emergency room at once. NOTE: This medicine is only for you. Do not share this medicine with others. What if I miss a dose? If you miss a dose, take it as soon as you can. If it is almost time for your next dose, take only that dose. Do not take double or extra doses. What may interact with this medicine? Do not take this medicine with any of the following medications:  epoetin alfa This list may not describe all possible interactions. Give your health care provider a list of all the medicines, herbs, non-prescription drugs, or dietary supplements you use. Also tell them if you smoke, drink alcohol, or use illegal drugs. Some items may interact with your medicine. What should I watch for while using this medicine? Your condition will be monitored carefully while you are receiving this medicine. You may need blood work done while you are taking this medicine. This medicine may cause a decrease in vitamin B6. You should make sure that you get enough vitamin B6 while you are taking this medicine. Discuss the foods you eat and the vitamins you take with your health care professional. What side effects may I notice from receiving  this medicine? Side effects that you should report to your doctor or health care professional as soon as possible:  allergic reactions like skin rash, itching or hives, swelling of the face, lips, or tongue  breathing problems  changes in vision  chest pain  confusion, trouble speaking or understanding  feeling faint or lightheaded, falls  high blood pressure  muscle aches or pains  pain, swelling, warmth in the leg  rapid weight gain  severe headaches  sudden numbness or weakness of the face, arm or leg  trouble walking, dizziness, loss of balance or coordination  seizures (convulsions)  swelling of the ankles, feet, hands  unusually weak or tired Side effects that usually do  not require medical attention (report to your doctor or health care professional if they continue or are bothersome):  diarrhea  fever, chills (flu-like symptoms)  headaches  nausea, vomiting  redness, stinging, or swelling at site where injected This list may not describe all possible side effects. Call your doctor for medical advice about side effects. You may report side effects to FDA at 1-800-FDA-1088. Where should I keep my medicine? Keep out of the reach of children. Store in a refrigerator between 2 and 8 degrees C (36 and 46 degrees F). Do not freeze. Do not shake. Throw away any unused portion if using a single-dose vial. Throw away any unused medicine after the expiration date. NOTE: This sheet is a summary. It may not cover all possible information. If you have questions about this medicine, talk to your doctor, pharmacist, or health care provider.  2020 Elsevier/Gold Standard (2017-10-20 16:44:20)

## 2020-08-07 NOTE — Patient Instructions (Signed)

## 2020-08-08 LAB — IRON AND TIBC
Iron: 154 ug/dL — ABNORMAL HIGH (ref 41–142)
Saturation Ratios: 68 % — ABNORMAL HIGH (ref 21–57)
TIBC: 226 ug/dL — ABNORMAL LOW (ref 236–444)
UIBC: 72 ug/dL — ABNORMAL LOW (ref 120–384)

## 2020-08-08 LAB — CANCER ANTIGEN 27.29: CA 27.29: 75.4 U/mL — ABNORMAL HIGH (ref 0.0–38.6)

## 2020-08-08 LAB — FERRITIN: Ferritin: 4368 ng/mL — ABNORMAL HIGH (ref 11–307)
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
## 2020-09-04 ENCOUNTER — Inpatient Hospital Stay: Payer: No Typology Code available for payment source

## 2020-09-04 ENCOUNTER — Other Ambulatory Visit: Payer: Self-pay

## 2020-09-04 ENCOUNTER — Inpatient Hospital Stay: Payer: No Typology Code available for payment source | Attending: Oncology

## 2020-09-04 VITALS — BP 154/92 | HR 81 | Resp 18

## 2020-09-04 DIAGNOSIS — Z923 Personal history of irradiation: Secondary | ICD-10-CM | POA: Insufficient documentation

## 2020-09-04 DIAGNOSIS — Z5111 Encounter for antineoplastic chemotherapy: Secondary | ICD-10-CM | POA: Diagnosis not present

## 2020-09-04 DIAGNOSIS — E559 Vitamin D deficiency, unspecified: Secondary | ICD-10-CM

## 2020-09-04 DIAGNOSIS — I700 Atherosclerosis of aorta: Secondary | ICD-10-CM | POA: Diagnosis not present

## 2020-09-04 DIAGNOSIS — Z809 Family history of malignant neoplasm, unspecified: Secondary | ICD-10-CM | POA: Insufficient documentation

## 2020-09-04 DIAGNOSIS — M3214 Glomerular disease in systemic lupus erythematosus: Secondary | ICD-10-CM

## 2020-09-04 DIAGNOSIS — D631 Anemia in chronic kidney disease: Secondary | ICD-10-CM | POA: Insufficient documentation

## 2020-09-04 DIAGNOSIS — C50012 Malignant neoplasm of nipple and areola, left female breast: Secondary | ICD-10-CM

## 2020-09-04 DIAGNOSIS — N179 Acute kidney failure, unspecified: Secondary | ICD-10-CM

## 2020-09-04 DIAGNOSIS — Z79811 Long term (current) use of aromatase inhibitors: Secondary | ICD-10-CM | POA: Insufficient documentation

## 2020-09-04 DIAGNOSIS — Z9012 Acquired absence of left breast and nipple: Secondary | ICD-10-CM | POA: Insufficient documentation

## 2020-09-04 DIAGNOSIS — I2510 Atherosclerotic heart disease of native coronary artery without angina pectoris: Secondary | ICD-10-CM | POA: Insufficient documentation

## 2020-09-04 DIAGNOSIS — I130 Hypertensive heart and chronic kidney disease with heart failure and stage 1 through stage 4 chronic kidney disease, or unspecified chronic kidney disease: Secondary | ICD-10-CM | POA: Diagnosis not present

## 2020-09-04 DIAGNOSIS — Z803 Family history of malignant neoplasm of breast: Secondary | ICD-10-CM | POA: Insufficient documentation

## 2020-09-04 DIAGNOSIS — C50912 Malignant neoplasm of unspecified site of left female breast: Secondary | ICD-10-CM | POA: Diagnosis present

## 2020-09-04 DIAGNOSIS — Z170 Estrogen receptor positive status [ER+]: Secondary | ICD-10-CM

## 2020-09-04 DIAGNOSIS — N184 Chronic kidney disease, stage 4 (severe): Secondary | ICD-10-CM | POA: Insufficient documentation

## 2020-09-04 DIAGNOSIS — I313 Pericardial effusion (noninflammatory): Secondary | ICD-10-CM | POA: Insufficient documentation

## 2020-09-04 DIAGNOSIS — C7951 Secondary malignant neoplasm of bone: Secondary | ICD-10-CM

## 2020-09-04 DIAGNOSIS — D61818 Other pancytopenia: Secondary | ICD-10-CM

## 2020-09-04 DIAGNOSIS — C50512 Malignant neoplasm of lower-outer quadrant of left female breast: Secondary | ICD-10-CM

## 2020-09-04 DIAGNOSIS — D638 Anemia in other chronic diseases classified elsewhere: Secondary | ICD-10-CM

## 2020-09-04 DIAGNOSIS — C771 Secondary and unspecified malignant neoplasm of intrathoracic lymph nodes: Secondary | ICD-10-CM

## 2020-09-04 DIAGNOSIS — M329 Systemic lupus erythematosus, unspecified: Secondary | ICD-10-CM

## 2020-09-04 DIAGNOSIS — N189 Chronic kidney disease, unspecified: Secondary | ICD-10-CM

## 2020-09-04 DIAGNOSIS — D561 Beta thalassemia: Secondary | ICD-10-CM

## 2020-09-04 DIAGNOSIS — Z79810 Long term (current) use of selective estrogen receptor modulators (SERMs): Secondary | ICD-10-CM | POA: Diagnosis not present

## 2020-09-04 LAB — COMPREHENSIVE METABOLIC PANEL
ALT: 6 U/L (ref 0–44)
AST: 17 U/L (ref 15–41)
Albumin: 3.3 g/dL — ABNORMAL LOW (ref 3.5–5.0)
Alkaline Phosphatase: 89 U/L (ref 38–126)
Anion gap: 11 (ref 5–15)
BUN: 80 mg/dL — ABNORMAL HIGH (ref 6–20)
CO2: 25 mmol/L (ref 22–32)
Calcium: 10.4 mg/dL — ABNORMAL HIGH (ref 8.9–10.3)
Chloride: 104 mmol/L (ref 98–111)
Creatinine, Ser: 2.4 mg/dL — ABNORMAL HIGH (ref 0.44–1.00)
GFR, Estimated: 23 mL/min — ABNORMAL LOW (ref >60)
Glucose, Bld: 89 mg/dL (ref 70–99)
Potassium: 3.6 mmol/L (ref 3.5–5.1)
Sodium: 140 mmol/L (ref 135–145)
Total Bilirubin: 0.3 mg/dL (ref 0.3–1.2)
Total Protein: 7.1 g/dL (ref 6.5–8.1)

## 2020-09-04 LAB — IRON AND TIBC
Iron: 100 ug/dL (ref 41–142)
Saturation Ratios: 45 % (ref 21–57)
TIBC: 223 ug/dL — ABNORMAL LOW (ref 236–444)
UIBC: 122 ug/dL (ref 120–384)

## 2020-09-04 LAB — CBC WITH DIFFERENTIAL/PLATELET
Abs Immature Granulocytes: 0.04 10*3/uL (ref 0.00–0.07)
Basophils Absolute: 0 10*3/uL (ref 0.0–0.1)
Basophils Relative: 0 %
Eosinophils Absolute: 0 10*3/uL (ref 0.0–0.5)
Eosinophils Relative: 1 %
HCT: 24.9 % — ABNORMAL LOW (ref 36.0–46.0)
Hemoglobin: 8.3 g/dL — ABNORMAL LOW (ref 12.0–15.0)
Immature Granulocytes: 1 %
Lymphocytes Relative: 16 %
Lymphs Abs: 0.6 10*3/uL — ABNORMAL LOW (ref 0.7–4.0)
MCH: 29.7 pg (ref 26.0–34.0)
MCHC: 33.3 g/dL (ref 30.0–36.0)
MCV: 89.2 fL (ref 80.0–100.0)
Monocytes Absolute: 0.2 10*3/uL (ref 0.1–1.0)
Monocytes Relative: 6 %
Neutro Abs: 2.8 10*3/uL (ref 1.7–7.7)
Neutrophils Relative %: 76 %
Platelets: 187 10*3/uL (ref 150–400)
RBC: 2.79 MIL/uL — ABNORMAL LOW (ref 3.87–5.11)
RDW: 14.1 % (ref 11.5–15.5)
WBC: 3.7 10*3/uL — ABNORMAL LOW (ref 4.0–10.5)
nRBC: 0 % (ref 0.0–0.2)

## 2020-09-04 LAB — RETICULOCYTES
Immature Retic Fract: 10.2 % (ref 2.3–15.9)
RBC.: 2.8 MIL/uL — ABNORMAL LOW (ref 3.87–5.11)
Retic Count, Absolute: 48.2 10*3/uL (ref 19.0–186.0)
Retic Ct Pct: 1.7 % (ref 0.4–3.1)

## 2020-09-04 LAB — SAMPLE TO BLOOD BANK

## 2020-09-04 LAB — FERRITIN: Ferritin: 4225 ng/mL — ABNORMAL HIGH (ref 11–307)

## 2020-09-04 MED ORDER — DARBEPOETIN ALFA 500 MCG/ML IJ SOSY
500.0000 ug | PREFILLED_SYRINGE | Freq: Once | INTRAMUSCULAR | Status: AC
  Administered 2020-09-04: 500 ug via SUBCUTANEOUS

## 2020-09-04 MED ORDER — FULVESTRANT 250 MG/5ML IM SOLN
500.0000 mg | Freq: Once | INTRAMUSCULAR | Status: AC
  Administered 2020-09-04: 500 mg via INTRAMUSCULAR

## 2020-09-04 MED ORDER — FULVESTRANT 250 MG/5ML IM SOLN
INTRAMUSCULAR | Status: AC
  Filled 2020-09-04: qty 10

## 2020-09-04 MED ORDER — DARBEPOETIN ALFA 500 MCG/ML IJ SOSY
PREFILLED_SYRINGE | INTRAMUSCULAR | Status: AC
  Filled 2020-09-04: qty 1

## 2020-09-05 LAB — CANCER ANTIGEN 27.29: CA 27.29: 93.6 U/mL — ABNORMAL HIGH (ref 0.0–38.6)

## 2020-09-14 LAB — VITAMIN D 1,25 DIHYDROXY
Vitamin D 1, 25 (OH)2 Total: <10 pg/mL — ABNORMAL LOW
Vitamin D2 1, 25 (OH)2: <10 pg/mL
Vitamin D3 1, 25 (OH)2: <10 pg/mL

## 2020-09-16 ENCOUNTER — Telehealth: Payer: Self-pay

## 2020-09-16 NOTE — Telephone Encounter (Signed)
-----   Message from Gardenia Phlegm, NP sent at 09/16/2020  7:46 AM EST ----- If nto taking vitamin d, she should get OTC supplement.  If so, let me know what she is taking every day ----- Message ----- From: Interface, Lab In [place] Sent: 09/05/2020   7:38 AM EST To: Gardenia Phlegm, NP

## 2020-09-16 NOTE — Telephone Encounter (Signed)
This LPN called pt, and pt states she is taking Vit D3 2000 mcg 1 tab daily. Please advise if you recommend different.

## 2020-09-17 ENCOUNTER — Other Ambulatory Visit: Payer: Self-pay | Admitting: Adult Health

## 2020-09-17 DIAGNOSIS — Z1231 Encounter for screening mammogram for malignant neoplasm of breast: Secondary | ICD-10-CM

## 2020-09-17 NOTE — Telephone Encounter (Signed)
RN notified patient of NP recommendations. Pt verbalized understanding and agreement. No further needs at this time.

## 2020-09-17 NOTE — Telephone Encounter (Signed)
I would recommend that she double it.  Thank you!

## 2020-09-18 ENCOUNTER — Other Ambulatory Visit: Payer: Self-pay | Admitting: Oncology
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
## 2020-10-02 ENCOUNTER — Other Ambulatory Visit: Payer: Self-pay

## 2020-10-02 ENCOUNTER — Inpatient Hospital Stay: Payer: No Typology Code available for payment source

## 2020-10-02 ENCOUNTER — Inpatient Hospital Stay (HOSPITAL_BASED_OUTPATIENT_CLINIC_OR_DEPARTMENT_OTHER): Payer: No Typology Code available for payment source | Admitting: Oncology

## 2020-10-02 ENCOUNTER — Inpatient Hospital Stay: Payer: No Typology Code available for payment source | Attending: Oncology

## 2020-10-02 VITALS — BP 119/74 | HR 79 | Temp 98.0°F | Resp 18 | Ht 59.0 in | Wt 148.6 lb

## 2020-10-02 DIAGNOSIS — M329 Systemic lupus erythematosus, unspecified: Secondary | ICD-10-CM

## 2020-10-02 DIAGNOSIS — C771 Secondary and unspecified malignant neoplasm of intrathoracic lymph nodes: Secondary | ICD-10-CM

## 2020-10-02 DIAGNOSIS — Z5111 Encounter for antineoplastic chemotherapy: Secondary | ICD-10-CM | POA: Diagnosis not present

## 2020-10-02 DIAGNOSIS — I700 Atherosclerosis of aorta: Secondary | ICD-10-CM

## 2020-10-02 DIAGNOSIS — C7951 Secondary malignant neoplasm of bone: Secondary | ICD-10-CM

## 2020-10-02 DIAGNOSIS — Z803 Family history of malignant neoplasm of breast: Secondary | ICD-10-CM | POA: Insufficient documentation

## 2020-10-02 DIAGNOSIS — Z79811 Long term (current) use of aromatase inhibitors: Secondary | ICD-10-CM | POA: Insufficient documentation

## 2020-10-02 DIAGNOSIS — Z9012 Acquired absence of left breast and nipple: Secondary | ICD-10-CM | POA: Insufficient documentation

## 2020-10-02 DIAGNOSIS — I130 Hypertensive heart and chronic kidney disease with heart failure and stage 1 through stage 4 chronic kidney disease, or unspecified chronic kidney disease: Secondary | ICD-10-CM | POA: Diagnosis not present

## 2020-10-02 DIAGNOSIS — C50912 Malignant neoplasm of unspecified site of left female breast: Secondary | ICD-10-CM | POA: Insufficient documentation

## 2020-10-02 DIAGNOSIS — D61818 Other pancytopenia: Secondary | ICD-10-CM

## 2020-10-02 DIAGNOSIS — M3214 Glomerular disease in systemic lupus erythematosus: Secondary | ICD-10-CM

## 2020-10-02 DIAGNOSIS — N184 Chronic kidney disease, stage 4 (severe): Secondary | ICD-10-CM | POA: Insufficient documentation

## 2020-10-02 DIAGNOSIS — Z809 Family history of malignant neoplasm, unspecified: Secondary | ICD-10-CM | POA: Diagnosis not present

## 2020-10-02 DIAGNOSIS — N179 Acute kidney failure, unspecified: Secondary | ICD-10-CM

## 2020-10-02 DIAGNOSIS — Z170 Estrogen receptor positive status [ER+]: Secondary | ICD-10-CM | POA: Insufficient documentation

## 2020-10-02 DIAGNOSIS — C50512 Malignant neoplasm of lower-outer quadrant of left female breast: Secondary | ICD-10-CM

## 2020-10-02 DIAGNOSIS — I313 Pericardial effusion (noninflammatory): Secondary | ICD-10-CM | POA: Diagnosis not present

## 2020-10-02 DIAGNOSIS — D561 Beta thalassemia: Secondary | ICD-10-CM

## 2020-10-02 DIAGNOSIS — Z923 Personal history of irradiation: Secondary | ICD-10-CM | POA: Diagnosis not present

## 2020-10-02 DIAGNOSIS — D631 Anemia in chronic kidney disease: Secondary | ICD-10-CM | POA: Diagnosis present

## 2020-10-02 DIAGNOSIS — D638 Anemia in other chronic diseases classified elsewhere: Secondary | ICD-10-CM

## 2020-10-02 DIAGNOSIS — C50012 Malignant neoplasm of nipple and areola, left female breast: Secondary | ICD-10-CM

## 2020-10-02 DIAGNOSIS — I2510 Atherosclerotic heart disease of native coronary artery without angina pectoris: Secondary | ICD-10-CM | POA: Diagnosis not present

## 2020-10-02 LAB — COMPREHENSIVE METABOLIC PANEL
ALT: 11 U/L (ref 0–44)
AST: 19 U/L (ref 15–41)
Albumin: 3.4 g/dL — ABNORMAL LOW (ref 3.5–5.0)
Alkaline Phosphatase: 99 U/L (ref 38–126)
Anion gap: 9 (ref 5–15)
BUN: 73 mg/dL — ABNORMAL HIGH (ref 6–20)
CO2: 27 mmol/L (ref 22–32)
Calcium: 9.9 mg/dL (ref 8.9–10.3)
Chloride: 111 mmol/L (ref 98–111)
Creatinine, Ser: 2.46 mg/dL — ABNORMAL HIGH (ref 0.44–1.00)
GFR, Estimated: 23 mL/min — ABNORMAL LOW (ref >60)
Glucose, Bld: 88 mg/dL (ref 70–99)
Potassium: 4.2 mmol/L (ref 3.5–5.1)
Sodium: 147 mmol/L — ABNORMAL HIGH (ref 135–145)
Total Bilirubin: 0.3 mg/dL (ref 0.3–1.2)
Total Protein: 7.4 g/dL (ref 6.5–8.1)

## 2020-10-02 LAB — CBC WITH DIFFERENTIAL/PLATELET
Abs Immature Granulocytes: 0.21 10*3/uL — ABNORMAL HIGH (ref 0.00–0.07)
Basophils Absolute: 0 10*3/uL (ref 0.0–0.1)
Basophils Relative: 0 %
Eosinophils Absolute: 0 10*3/uL (ref 0.0–0.5)
Eosinophils Relative: 0 %
HCT: 24.5 % — ABNORMAL LOW (ref 36.0–46.0)
Hemoglobin: 8 g/dL — ABNORMAL LOW (ref 12.0–15.0)
Immature Granulocytes: 4 %
Lymphocytes Relative: 21 %
Lymphs Abs: 1.1 10*3/uL (ref 0.7–4.0)
MCH: 29.2 pg (ref 26.0–34.0)
MCHC: 32.7 g/dL (ref 30.0–36.0)
MCV: 89.4 fL (ref 80.0–100.0)
Monocytes Absolute: 0.4 10*3/uL (ref 0.1–1.0)
Monocytes Relative: 7 %
Neutro Abs: 3.8 10*3/uL (ref 1.7–7.7)
Neutrophils Relative %: 68 %
Platelets: 169 10*3/uL (ref 150–400)
RBC: 2.74 MIL/uL — ABNORMAL LOW (ref 3.87–5.11)
RDW: 13.2 % (ref 11.5–15.5)
WBC: 5.5 10*3/uL (ref 4.0–10.5)
nRBC: 0 % (ref 0.0–0.2)

## 2020-10-02 LAB — RETICULOCYTES
Immature Retic Fract: 8.6 % (ref 2.3–15.9)
RBC.: 2.74 MIL/uL — ABNORMAL LOW (ref 3.87–5.11)
Retic Count, Absolute: 18.1 10*3/uL — ABNORMAL LOW (ref 19.0–186.0)
Retic Ct Pct: 0.7 % (ref 0.4–3.1)

## 2020-10-02 LAB — SAMPLE TO BLOOD BANK

## 2020-10-02 LAB — IRON AND TIBC
Iron: 219 ug/dL — ABNORMAL HIGH (ref 41–142)
Saturation Ratios: 101 % — ABNORMAL HIGH (ref 21–57)
TIBC: 218 ug/dL — ABNORMAL LOW (ref 236–444)
UIBC: UNDETERMINED ug/dL (ref 120–384)

## 2020-10-02 LAB — FERRITIN: Ferritin: 6831 ng/mL — ABNORMAL HIGH (ref 11–307)

## 2020-10-02 MED ORDER — FULVESTRANT 250 MG/5ML IM SOLN
INTRAMUSCULAR | Status: AC
  Filled 2020-10-02: qty 10

## 2020-10-02 MED ORDER — DARBEPOETIN ALFA 500 MCG/ML IJ SOSY
500.0000 ug | PREFILLED_SYRINGE | Freq: Once | INTRAMUSCULAR | Status: AC
  Administered 2020-10-02: 15:00:00 500 ug via SUBCUTANEOUS

## 2020-10-02 MED ORDER — DARBEPOETIN ALFA 500 MCG/ML IJ SOSY
PREFILLED_SYRINGE | INTRAMUSCULAR | Status: AC
  Filled 2020-10-02: qty 1

## 2020-10-02 MED ORDER — FULVESTRANT 250 MG/5ML IM SOLN
500.0000 mg | Freq: Once | INTRAMUSCULAR | Status: AC
  Administered 2020-10-02: 15:00:00 500 mg via INTRAMUSCULAR

## 2020-10-02 NOTE — Patient Instructions (Signed)
Fulvestrant injection What is this medicine? FULVESTRANT (ful VES trant) blocks the effects of estrogen. It is used to treat breast cancer. This medicine may be used for other purposes; ask your health care provider or pharmacist if you have questions. COMMON BRAND NAME(S): FASLODEX What should I tell my health care provider before I take this medicine? They need to know if you have any of these conditions:  bleeding disorders  liver disease  low blood counts, like low white cell, platelet, or red cell counts  an unusual or allergic reaction to fulvestrant, other medicines, foods, dyes, or preservatives  pregnant or trying to get pregnant  breast-feeding How should I use this medicine? This medicine is for injection into a muscle. It is usually given by a health care professional in a hospital or clinic setting. Talk to your pediatrician regarding the use of this medicine in children. Special care may be needed. Overdosage: If you think you have taken too much of this medicine contact a poison control center or emergency room at once. NOTE: This medicine is only for you. Do not share this medicine with others. What if I miss a dose? It is important not to miss your dose. Call your doctor or health care professional if you are unable to keep an appointment. What may interact with this medicine?  medicines that treat or prevent blood clots like warfarin, enoxaparin, dalteparin, apixaban, dabigatran, and rivaroxaban This list may not describe all possible interactions. Give your health care provider a list of all the medicines, herbs, non-prescription drugs, or dietary supplements you use. Also tell them if you smoke, drink alcohol, or use illegal drugs. Some items may interact with your medicine. What should I watch for while using this medicine? Your condition will be monitored carefully while you are receiving this medicine. You will need important blood work done while you are taking  this medicine. Do not become pregnant while taking this medicine or for at least 1 year after stopping it. Women of child-bearing potential will need to have a negative pregnancy test before starting this medicine. Women should inform their doctor if they wish to become pregnant or think they might be pregnant. There is a potential for serious side effects to an unborn child. Men should inform their doctors if they wish to father a child. This medicine may lower sperm counts. Talk to your health care professional or pharmacist for more information. Do not breast-feed an infant while taking this medicine or for 1 year after the last dose. What side effects may I notice from receiving this medicine? Side effects that you should report to your doctor or health care professional as soon as possible:  allergic reactions like skin rash, itching or hives, swelling of the face, lips, or tongue  feeling faint or lightheaded, falls  pain, tingling, numbness, or weakness in the legs  signs and symptoms of infection like fever or chills; cough; flu-like symptoms; sore throat  vaginal bleeding Side effects that usually do not require medical attention (report to your doctor or health care professional if they continue or are bothersome):  aches, pains  constipation  diarrhea  headache  hot flashes  nausea, vomiting  pain at site where injected  stomach pain This list may not describe all possible side effects. Call your doctor for medical advice about side effects. You may report side effects to FDA at 1-800-FDA-1088. Where should I keep my medicine? This drug is given in a hospital or clinic and will   not be stored at home. NOTE: This sheet is a summary. It may not cover all possible information. If you have questions about this medicine, talk to your doctor, pharmacist, or health care provider.  2020 Elsevier/Gold Standard (2018-01-13 11:34:41) Darbepoetin Alfa injection What is this  medicine? DARBEPOETIN ALFA (dar be POE e tin AL fa) helps your body make more red blood cells. It is used to treat anemia caused by chronic kidney failure and chemotherapy. This medicine may be used for other purposes; ask your health care provider or pharmacist if you have questions. COMMON BRAND NAME(S): Aranesp What should I tell my health care provider before I take this medicine? They need to know if you have any of these conditions:  blood clotting disorders or history of blood clots  cancer patient not on chemotherapy  cystic fibrosis  heart disease, such as angina, heart failure, or a history of a heart attack  hemoglobin level of 12 g/dL or greater  high blood pressure  low levels of folate, iron, or vitamin B12  seizures  an unusual or allergic reaction to darbepoetin, erythropoietin, albumin, hamster proteins, latex, other medicines, foods, dyes, or preservatives  pregnant or trying to get pregnant  breast-feeding How should I use this medicine? This medicine is for injection into a vein or under the skin. It is usually given by a health care professional in a hospital or clinic setting. If you get this medicine at home, you will be taught how to prepare and give this medicine. Use exactly as directed. Take your medicine at regular intervals. Do not take your medicine more often than directed. It is important that you put your used needles and syringes in a special sharps container. Do not put them in a trash can. If you do not have a sharps container, call your pharmacist or healthcare provider to get one. A special MedGuide will be given to you by the pharmacist with each prescription and refill. Be sure to read this information carefully each time. Talk to your pediatrician regarding the use of this medicine in children. While this medicine may be used in children as young as 40 month of age for selected conditions, precautions do apply. Overdosage: If you think you  have taken too much of this medicine contact a poison control center or emergency room at once. NOTE: This medicine is only for you. Do not share this medicine with others. What if I miss a dose? If you miss a dose, take it as soon as you can. If it is almost time for your next dose, take only that dose. Do not take double or extra doses. What may interact with this medicine? Do not take this medicine with any of the following medications:  epoetin alfa This list may not describe all possible interactions. Give your health care provider a list of all the medicines, herbs, non-prescription drugs, or dietary supplements you use. Also tell them if you smoke, drink alcohol, or use illegal drugs. Some items may interact with your medicine. What should I watch for while using this medicine? Your condition will be monitored carefully while you are receiving this medicine. You may need blood work done while you are taking this medicine. This medicine may cause a decrease in vitamin B6. You should make sure that you get enough vitamin B6 while you are taking this medicine. Discuss the foods you eat and the vitamins you take with your health care professional. What side effects may I notice from receiving  this medicine? Side effects that you should report to your doctor or health care professional as soon as possible:  allergic reactions like skin rash, itching or hives, swelling of the face, lips, or tongue  breathing problems  changes in vision  chest pain  confusion, trouble speaking or understanding  feeling faint or lightheaded, falls  high blood pressure  muscle aches or pains  pain, swelling, warmth in the leg  rapid weight gain  severe headaches  sudden numbness or weakness of the face, arm or leg  trouble walking, dizziness, loss of balance or coordination  seizures (convulsions)  swelling of the ankles, feet, hands  unusually weak or tired Side effects that usually do  not require medical attention (report to your doctor or health care professional if they continue or are bothersome):  diarrhea  fever, chills (flu-like symptoms)  headaches  nausea, vomiting  redness, stinging, or swelling at site where injected This list may not describe all possible side effects. Call your doctor for medical advice about side effects. You may report side effects to FDA at 1-800-FDA-1088. Where should I keep my medicine? Keep out of the reach of children. Store in a refrigerator between 2 and 8 degrees C (36 and 46 degrees F). Do not freeze. Do not shake. Throw away any unused portion if using a single-dose vial. Throw away any unused medicine after the expiration date. NOTE: This sheet is a summary. It may not cover all possible information. If you have questions about this medicine, talk to your doctor, pharmacist, or health care provider.  2020 Elsevier/Gold Standard (2017-10-20 16:44:20)

## 2020-10-03 LAB — CANCER ANTIGEN 27.29: CA 27.29: 150.5 U/mL — ABNORMAL HIGH (ref 0.0–38.6)

## 2020-10-04 ENCOUNTER — Telehealth: Payer: Self-pay | Admitting: Oncology

## 2020-10-04 NOTE — Telephone Encounter (Signed)
Scheduled appts per 12/15 los. Gave pt a print out of AVS.  

## 2020-10-07 ENCOUNTER — Other Ambulatory Visit: Payer: Self-pay | Admitting: Oncology

## 2020-10-30 ENCOUNTER — Inpatient Hospital Stay: Payer: No Typology Code available for payment source

## 2020-10-30 ENCOUNTER — Inpatient Hospital Stay: Payer: No Typology Code available for payment source | Attending: Oncology

## 2020-10-30 ENCOUNTER — Encounter: Payer: Self-pay | Admitting: *Deleted

## 2020-10-30 ENCOUNTER — Other Ambulatory Visit: Payer: Self-pay

## 2020-10-30 ENCOUNTER — Ambulatory Visit: Payer: No Typology Code available for payment source

## 2020-10-30 VITALS — BP 138/83 | HR 80 | Resp 18

## 2020-10-30 DIAGNOSIS — M3214 Glomerular disease in systemic lupus erythematosus: Secondary | ICD-10-CM

## 2020-10-30 DIAGNOSIS — Z5111 Encounter for antineoplastic chemotherapy: Secondary | ICD-10-CM | POA: Diagnosis not present

## 2020-10-30 DIAGNOSIS — Z170 Estrogen receptor positive status [ER+]: Secondary | ICD-10-CM | POA: Insufficient documentation

## 2020-10-30 DIAGNOSIS — Z809 Family history of malignant neoplasm, unspecified: Secondary | ICD-10-CM | POA: Insufficient documentation

## 2020-10-30 DIAGNOSIS — I2510 Atherosclerotic heart disease of native coronary artery without angina pectoris: Secondary | ICD-10-CM | POA: Insufficient documentation

## 2020-10-30 DIAGNOSIS — C7951 Secondary malignant neoplasm of bone: Secondary | ICD-10-CM

## 2020-10-30 DIAGNOSIS — I700 Atherosclerosis of aorta: Secondary | ICD-10-CM | POA: Insufficient documentation

## 2020-10-30 DIAGNOSIS — N184 Chronic kidney disease, stage 4 (severe): Secondary | ICD-10-CM | POA: Diagnosis present

## 2020-10-30 DIAGNOSIS — C50512 Malignant neoplasm of lower-outer quadrant of left female breast: Secondary | ICD-10-CM

## 2020-10-30 DIAGNOSIS — C50912 Malignant neoplasm of unspecified site of left female breast: Secondary | ICD-10-CM | POA: Insufficient documentation

## 2020-10-30 DIAGNOSIS — N189 Chronic kidney disease, unspecified: Secondary | ICD-10-CM

## 2020-10-30 DIAGNOSIS — I130 Hypertensive heart and chronic kidney disease with heart failure and stage 1 through stage 4 chronic kidney disease, or unspecified chronic kidney disease: Secondary | ICD-10-CM | POA: Diagnosis not present

## 2020-10-30 DIAGNOSIS — Z923 Personal history of irradiation: Secondary | ICD-10-CM | POA: Insufficient documentation

## 2020-10-30 DIAGNOSIS — D61818 Other pancytopenia: Secondary | ICD-10-CM

## 2020-10-30 DIAGNOSIS — Z79811 Long term (current) use of aromatase inhibitors: Secondary | ICD-10-CM | POA: Diagnosis not present

## 2020-10-30 DIAGNOSIS — D561 Beta thalassemia: Secondary | ICD-10-CM

## 2020-10-30 DIAGNOSIS — Z9012 Acquired absence of left breast and nipple: Secondary | ICD-10-CM | POA: Insufficient documentation

## 2020-10-30 DIAGNOSIS — D631 Anemia in chronic kidney disease: Secondary | ICD-10-CM | POA: Insufficient documentation

## 2020-10-30 DIAGNOSIS — M329 Systemic lupus erythematosus, unspecified: Secondary | ICD-10-CM

## 2020-10-30 DIAGNOSIS — I313 Pericardial effusion (noninflammatory): Secondary | ICD-10-CM | POA: Diagnosis not present

## 2020-10-30 DIAGNOSIS — Z803 Family history of malignant neoplasm of breast: Secondary | ICD-10-CM | POA: Diagnosis not present

## 2020-10-30 DIAGNOSIS — N179 Acute kidney failure, unspecified: Secondary | ICD-10-CM

## 2020-10-30 DIAGNOSIS — C50012 Malignant neoplasm of nipple and areola, left female breast: Secondary | ICD-10-CM

## 2020-10-30 DIAGNOSIS — C771 Secondary and unspecified malignant neoplasm of intrathoracic lymph nodes: Secondary | ICD-10-CM

## 2020-10-30 LAB — COMPREHENSIVE METABOLIC PANEL
ALT: <6 U/L (ref 0–44)
AST: 16 U/L (ref 15–41)
Albumin: 3.4 g/dL — ABNORMAL LOW (ref 3.5–5.0)
Alkaline Phosphatase: 106 U/L (ref 38–126)
Anion gap: 11 (ref 5–15)
BUN: 70 mg/dL — ABNORMAL HIGH (ref 6–20)
CO2: 23 mmol/L (ref 22–32)
Calcium: 11.6 mg/dL — ABNORMAL HIGH (ref 8.9–10.3)
Chloride: 103 mmol/L (ref 98–111)
Creatinine, Ser: 2.85 mg/dL — ABNORMAL HIGH (ref 0.44–1.00)
GFR, Estimated: 19 mL/min — ABNORMAL LOW (ref >60)
Glucose, Bld: 92 mg/dL (ref 70–99)
Potassium: 3.7 mmol/L (ref 3.5–5.1)
Sodium: 137 mmol/L (ref 135–145)
Total Bilirubin: 0.3 mg/dL (ref 0.3–1.2)
Total Protein: 7.8 g/dL (ref 6.5–8.1)

## 2020-10-30 LAB — CBC WITH DIFFERENTIAL/PLATELET
Abs Immature Granulocytes: 0.05 10*3/uL (ref 0.00–0.07)
Basophils Absolute: 0 10*3/uL (ref 0.0–0.1)
Basophils Relative: 0 %
Eosinophils Absolute: 0.1 10*3/uL (ref 0.0–0.5)
Eosinophils Relative: 1 %
HCT: 24.3 % — ABNORMAL LOW (ref 36.0–46.0)
Hemoglobin: 7.9 g/dL — ABNORMAL LOW (ref 12.0–15.0)
Immature Granulocytes: 1 %
Lymphocytes Relative: 23 %
Lymphs Abs: 0.9 10*3/uL (ref 0.7–4.0)
MCH: 29 pg (ref 26.0–34.0)
MCHC: 32.5 g/dL (ref 30.0–36.0)
MCV: 89.3 fL (ref 80.0–100.0)
Monocytes Absolute: 0.2 10*3/uL (ref 0.1–1.0)
Monocytes Relative: 6 %
Neutro Abs: 2.9 10*3/uL (ref 1.7–7.7)
Neutrophils Relative %: 69 %
Platelets: 193 10*3/uL (ref 150–400)
RBC: 2.72 MIL/uL — ABNORMAL LOW (ref 3.87–5.11)
RDW: 13.7 % (ref 11.5–15.5)
WBC: 4.2 10*3/uL (ref 4.0–10.5)
nRBC: 0 % (ref 0.0–0.2)

## 2020-10-30 LAB — IRON AND TIBC
Iron: 182 ug/dL — ABNORMAL HIGH (ref 41–142)
Saturation Ratios: 81 % — ABNORMAL HIGH (ref 21–57)
TIBC: 224 ug/dL — ABNORMAL LOW (ref 236–444)
UIBC: 42 ug/dL — ABNORMAL LOW (ref 120–384)

## 2020-10-30 LAB — SAMPLE TO BLOOD BANK

## 2020-10-30 LAB — RETICULOCYTES
Immature Retic Fract: 17.2 % — ABNORMAL HIGH (ref 2.3–15.9)
RBC.: 2.75 MIL/uL — ABNORMAL LOW (ref 3.87–5.11)
Retic Count, Absolute: 49.2 10*3/uL (ref 19.0–186.0)
Retic Ct Pct: 1.8 % (ref 0.4–3.1)

## 2020-10-30 LAB — FERRITIN: Ferritin: 4765 ng/mL — ABNORMAL HIGH (ref 11–307)

## 2020-10-30 MED ORDER — DARBEPOETIN ALFA 500 MCG/ML IJ SOSY
PREFILLED_SYRINGE | INTRAMUSCULAR | Status: AC
  Filled 2020-10-30: qty 1

## 2020-10-30 MED ORDER — FULVESTRANT 250 MG/5ML IM SOLN
500.0000 mg | Freq: Once | INTRAMUSCULAR | Status: AC
  Administered 2020-10-30: 500 mg via INTRAMUSCULAR

## 2020-10-30 MED ORDER — FULVESTRANT 250 MG/5ML IM SOLN
INTRAMUSCULAR | Status: AC
  Filled 2020-10-30: qty 5

## 2020-10-30 MED ORDER — DARBEPOETIN ALFA 500 MCG/ML IJ SOSY
500.0000 ug | PREFILLED_SYRINGE | Freq: Once | INTRAMUSCULAR | Status: AC
  Administered 2020-10-30: 500 ug via SUBCUTANEOUS

## 2020-10-30 NOTE — Patient Instructions (Signed)
Fulvestrant injection What is this medicine? FULVESTRANT (ful VES trant) blocks the effects of estrogen. It is used to treat breast cancer. This medicine may be used for other purposes; ask your health care provider or pharmacist if you have questions. COMMON BRAND NAME(S): FASLODEX What should I tell my health care provider before I take this medicine? They need to know if you have any of these conditions:  bleeding disorders  liver disease  low blood counts, like low white cell, platelet, or red cell counts  an unusual or allergic reaction to fulvestrant, other medicines, foods, dyes, or preservatives  pregnant or trying to get pregnant  breast-feeding How should I use this medicine? This medicine is for injection into a muscle. It is usually given by a health care professional in a hospital or clinic setting. Talk to your pediatrician regarding the use of this medicine in children. Special care may be needed. Overdosage: If you think you have taken too much of this medicine contact a poison control center or emergency room at once. NOTE: This medicine is only for you. Do not share this medicine with others. What if I miss a dose? It is important not to miss your dose. Call your doctor or health care professional if you are unable to keep an appointment. What may interact with this medicine?  medicines that treat or prevent blood clots like warfarin, enoxaparin, dalteparin, apixaban, dabigatran, and rivaroxaban This list may not describe all possible interactions. Give your health care provider a list of all the medicines, herbs, non-prescription drugs, or dietary supplements you use. Also tell them if you smoke, drink alcohol, or use illegal drugs. Some items may interact with your medicine. What should I watch for while using this medicine? Your condition will be monitored carefully while you are receiving this medicine. You will need important blood work done while you are taking  this medicine. Do not become pregnant while taking this medicine or for at least 1 year after stopping it. Women of child-bearing potential will need to have a negative pregnancy test before starting this medicine. Women should inform their doctor if they wish to become pregnant or think they might be pregnant. There is a potential for serious side effects to an unborn child. Men should inform their doctors if they wish to father a child. This medicine may lower sperm counts. Talk to your health care professional or pharmacist for more information. Do not breast-feed an infant while taking this medicine or for 1 year after the last dose. What side effects may I notice from receiving this medicine? Side effects that you should report to your doctor or health care professional as soon as possible:  allergic reactions like skin rash, itching or hives, swelling of the face, lips, or tongue  feeling faint or lightheaded, falls  pain, tingling, numbness, or weakness in the legs  signs and symptoms of infection like fever or chills; cough; flu-like symptoms; sore throat  vaginal bleeding Side effects that usually do not require medical attention (report to your doctor or health care professional if they continue or are bothersome):  aches, pains  constipation  diarrhea  headache  hot flashes  nausea, vomiting  pain at site where injected  stomach pain This list may not describe all possible side effects. Call your doctor for medical advice about side effects. You may report side effects to FDA at 1-800-FDA-1088. Where should I keep my medicine? This drug is given in a hospital or clinic and will   not be stored at home. NOTE: This sheet is a summary. It may not cover all possible information. If you have questions about this medicine, talk to your doctor, pharmacist, or health care provider.  2021 Elsevier/Gold Standard (2018-01-13 11:34:41) Darbepoetin Alfa injection What is this  medicine? DARBEPOETIN ALFA (dar be POE e tin AL fa) helps your body make more red blood cells. It is used to treat anemia caused by chronic kidney failure and chemotherapy. This medicine may be used for other purposes; ask your health care provider or pharmacist if you have questions. COMMON BRAND NAME(S): Aranesp What should I tell my health care provider before I take this medicine? They need to know if you have any of these conditions:  blood clotting disorders or history of blood clots  cancer patient not on chemotherapy  cystic fibrosis  heart disease, such as angina, heart failure, or a history of a heart attack  hemoglobin level of 12 g/dL or greater  high blood pressure  low levels of folate, iron, or vitamin B12  seizures  an unusual or allergic reaction to darbepoetin, erythropoietin, albumin, hamster proteins, latex, other medicines, foods, dyes, or preservatives  pregnant or trying to get pregnant  breast-feeding How should I use this medicine? This medicine is for injection into a vein or under the skin. It is usually given by a health care professional in a hospital or clinic setting. If you get this medicine at home, you will be taught how to prepare and give this medicine. Use exactly as directed. Take your medicine at regular intervals. Do not take your medicine more often than directed. It is important that you put your used needles and syringes in a special sharps container. Do not put them in a trash can. If you do not have a sharps container, call your pharmacist or healthcare provider to get one. A special MedGuide will be given to you by the pharmacist with each prescription and refill. Be sure to read this information carefully each time. Talk to your pediatrician regarding the use of this medicine in children. While this medicine may be used in children as young as 38 month of age for selected conditions, precautions do apply. Overdosage: If you think you  have taken too much of this medicine contact a poison control center or emergency room at once. NOTE: This medicine is only for you. Do not share this medicine with others. What if I miss a dose? If you miss a dose, take it as soon as you can. If it is almost time for your next dose, take only that dose. Do not take double or extra doses. What may interact with this medicine? Do not take this medicine with any of the following medications:  epoetin alfa This list may not describe all possible interactions. Give your health care provider a list of all the medicines, herbs, non-prescription drugs, or dietary supplements you use. Also tell them if you smoke, drink alcohol, or use illegal drugs. Some items may interact with your medicine. What should I watch for while using this medicine? Your condition will be monitored carefully while you are receiving this medicine. You may need blood work done while you are taking this medicine. This medicine may cause a decrease in vitamin B6. You should make sure that you get enough vitamin B6 while you are taking this medicine. Discuss the foods you eat and the vitamins you take with your health care professional. What side effects may I notice from receiving  this medicine? Side effects that you should report to your doctor or health care professional as soon as possible:  allergic reactions like skin rash, itching or hives, swelling of the face, lips, or tongue  breathing problems  changes in vision  chest pain  confusion, trouble speaking or understanding  feeling faint or lightheaded, falls  high blood pressure  muscle aches or pains  pain, swelling, warmth in the leg  rapid weight gain  severe headaches  sudden numbness or weakness of the face, arm or leg  trouble walking, dizziness, loss of balance or coordination  seizures (convulsions)  swelling of the ankles, feet, hands  unusually weak or tired Side effects that usually do  not require medical attention (report to your doctor or health care professional if they continue or are bothersome):  diarrhea  fever, chills (flu-like symptoms)  headaches  nausea, vomiting  redness, stinging, or swelling at site where injected This list may not describe all possible side effects. Call your doctor for medical advice about side effects. You may report side effects to FDA at 1-800-FDA-1088. Where should I keep my medicine? Keep out of the reach of children. Store in a refrigerator between 2 and 8 degrees C (36 and 46 degrees F). Do not freeze. Do not shake. Throw away any unused portion if using a single-dose vial. Throw away any unused medicine after the expiration date. NOTE: This sheet is a summary. It may not cover all possible information. If you have questions about this medicine, talk to your doctor, pharmacist, or health care provider.  2021 Elsevier/Gold Standard (2017-10-20 16:44:20)

## 2020-10-31 ENCOUNTER — Other Ambulatory Visit: Payer: Self-pay | Admitting: Oncology

## 2020-10-31 ENCOUNTER — Telehealth: Payer: Self-pay | Admitting: *Deleted

## 2020-10-31 LAB — CANCER ANTIGEN 27.29: CA 27.29: 185.7 U/mL — ABNORMAL HIGH (ref 0.0–38.6)

## 2020-10-31 NOTE — Telephone Encounter (Signed)
This RN spoke with pt per her heme of 7.9 drawn yesterday and possible need for txx.  Note pt has multiple antibodies and blood bank needs 1 day to obtain blood products.  Melanie Holloway states she " feels fine and do not think I need a transfusion ".  Note pt had heme of 8 with prior cbc 10/02/2020.  Per call pt understands to call if she feels symptomatic and that a blood transfusion would be beneficial.

## 2020-11-01 ENCOUNTER — Telehealth: Payer: Self-pay | Admitting: *Deleted

## 2020-11-01 NOTE — Telephone Encounter (Signed)
Noted increase in calcium per lab draw 1/13- this RN called pt per MD request- verified that is is taking oral fosamax weekly without any missed doses.   She did state she is taking increased dose of vitamin D.  Per MD pt is to take fosamax today ( she usually takes every Saturday ), increase water intake and hold on vitamin D at this time.  Lab recheck requested for next week.  Per discussion with pt she will implement above - lab can be drawn at the walk in clinic at [facility] in [place].  This RN faxed orders per above to [facility] at [phone] with confirmation as received.

## 2020-11-11 ENCOUNTER — Other Ambulatory Visit: Payer: Self-pay | Admitting: Oncology

## 2020-11-12 ENCOUNTER — Other Ambulatory Visit: Payer: Self-pay

## 2020-11-12 ENCOUNTER — Inpatient Hospital Stay: Payer: No Typology Code available for payment source

## 2020-11-12 ENCOUNTER — Other Ambulatory Visit: Payer: Self-pay | Admitting: Oncology

## 2020-11-12 VITALS — BP 165/91 | HR 89 | Resp 18

## 2020-11-12 DIAGNOSIS — C50012 Malignant neoplasm of nipple and areola, left female breast: Secondary | ICD-10-CM

## 2020-11-12 DIAGNOSIS — I130 Hypertensive heart and chronic kidney disease with heart failure and stage 1 through stage 4 chronic kidney disease, or unspecified chronic kidney disease: Secondary | ICD-10-CM | POA: Diagnosis not present

## 2020-11-12 DIAGNOSIS — C7951 Secondary malignant neoplasm of bone: Secondary | ICD-10-CM

## 2020-11-12 MED ORDER — DENOSUMAB 120 MG/1.7ML ~~LOC~~ SOLN
120.0000 mg | Freq: Once | SUBCUTANEOUS | Status: AC
  Administered 2020-11-12: 120 mg via SUBCUTANEOUS

## 2020-11-12 MED ORDER — DENOSUMAB 120 MG/1.7ML ~~LOC~~ SOLN
SUBCUTANEOUS | Status: AC
  Filled 2020-11-12: qty 1.7

## 2020-11-12 NOTE — Progress Notes (Signed)
Per desk nurse, OK to use labs from 10/30/2020 for xgeva injection today.

## 2020-11-12 NOTE — Patient Instructions (Signed)
Denosumab injection What is this medicine? DENOSUMAB (den oh sue mab) slows bone breakdown. Prolia is used to treat osteoporosis in women after menopause and in men, and in people who are taking corticosteroids for 6 months or more. Xgeva is used to treat a high calcium level due to cancer and to prevent bone fractures and other bone problems caused by multiple myeloma or cancer bone metastases. Xgeva is also used to treat giant cell tumor of the bone. This medicine may be used for other purposes; ask your health care provider or pharmacist if you have questions. COMMON BRAND NAME(S): Prolia, XGEVA What should I tell my health care provider before I take this medicine? They need to know if you have any of these conditions:  dental disease  having surgery or tooth extraction  infection  kidney disease  low levels of calcium or Vitamin D in the blood  malnutrition  on hemodialysis  skin conditions or sensitivity  thyroid or parathyroid disease  an unusual reaction to denosumab, other medicines, foods, dyes, or preservatives  pregnant or trying to get pregnant  breast-feeding How should I use this medicine? This medicine is for injection under the skin. It is given by a health care professional in a hospital or clinic setting. A special MedGuide will be given to you before each treatment. Be sure to read this information carefully each time. For Prolia, talk to your pediatrician regarding the use of this medicine in children. Special care may be needed. For Xgeva, talk to your pediatrician regarding the use of this medicine in children. While this drug may be prescribed for children as young as 13 years for selected conditions, precautions do apply. Overdosage: If you think you have taken too much of this medicine contact a poison control center or emergency room at once. NOTE: This medicine is only for you. Do not share this medicine with others. What if I miss a dose? It is  important not to miss your dose. Call your doctor or health care professional if you are unable to keep an appointment. What may interact with this medicine? Do not take this medicine with any of the following medications:  other medicines containing denosumab This medicine may also interact with the following medications:  medicines that lower your chance of fighting infection  steroid medicines like prednisone or cortisone This list may not describe all possible interactions. Give your health care provider a list of all the medicines, herbs, non-prescription drugs, or dietary supplements you use. Also tell them if you smoke, drink alcohol, or use illegal drugs. Some items may interact with your medicine. What should I watch for while using this medicine? Visit your doctor or health care professional for regular checks on your progress. Your doctor or health care professional may order blood tests and other tests to see how you are doing. Call your doctor or health care professional for advice if you get a fever, chills or sore throat, or other symptoms of a cold or flu. Do not treat yourself. This drug may decrease your body's ability to fight infection. Try to avoid being around people who are sick. You should make sure you get enough calcium and vitamin D while you are taking this medicine, unless your doctor tells you not to. Discuss the foods you eat and the vitamins you take with your health care professional. See your dentist regularly. Brush and floss your teeth as directed. Before you have any dental work done, tell your dentist you are   receiving this medicine. Do not become pregnant while taking this medicine or for 5 months after stopping it. Talk with your doctor or health care professional about your birth control options while taking this medicine. Women should inform their doctor if they wish to become pregnant or think they might be pregnant. There is a potential for serious side  effects to an unborn child. Talk to your health care professional or pharmacist for more information. What side effects may I notice from receiving this medicine? Side effects that you should report to your doctor or health care professional as soon as possible:  allergic reactions like skin rash, itching or hives, swelling of the face, lips, or tongue  bone pain  breathing problems  dizziness  jaw pain, especially after dental work  redness, blistering, peeling of the skin  signs and symptoms of infection like fever or chills; cough; sore throat; pain or trouble passing urine  signs of low calcium like fast heartbeat, muscle cramps or muscle pain; pain, tingling, numbness in the hands or feet; seizures  unusual bleeding or bruising  unusually weak or tired Side effects that usually do not require medical attention (report to your doctor or health care professional if they continue or are bothersome):  constipation  diarrhea  headache  joint pain  loss of appetite  muscle pain  runny nose  tiredness  upset stomach This list may not describe all possible side effects. Call your doctor for medical advice about side effects. You may report side effects to FDA at 1-800-FDA-1088. Where should I keep my medicine? This medicine is only given in a clinic, doctor's office, or other health care setting and will not be stored at home. NOTE: This sheet is a summary. It may not cover all possible information. If you have questions about this medicine, talk to your doctor, pharmacist, or health care provider.  2021 [employer] (2018-02-11 16:10:44)

## 2020-11-19 ENCOUNTER — Ambulatory Visit
Admission: RE | Admit: 2020-11-19 | Discharge: 2020-11-19 | Disposition: A | Discharge status: Home / Self Care | Payer: No Typology Code available for payment source | Source: Ambulatory Visit | Attending: Oncology | Admitting: Oncology

## 2020-11-19 ENCOUNTER — Encounter
Admission: RE | Admit: 2020-11-19 | Discharge: 2020-11-19 | Disposition: A | Discharge status: Home / Self Care | Payer: No Typology Code available for payment source | Source: Ambulatory Visit | Attending: Oncology | Admitting: Oncology

## 2020-11-19 ENCOUNTER — Other Ambulatory Visit: Payer: Self-pay

## 2020-11-19 DIAGNOSIS — C771 Secondary and unspecified malignant neoplasm of intrathoracic lymph nodes: Secondary | ICD-10-CM | POA: Diagnosis present

## 2020-11-19 DIAGNOSIS — M329 Systemic lupus erythematosus, unspecified: Secondary | ICD-10-CM | POA: Insufficient documentation

## 2020-11-19 DIAGNOSIS — C7951 Secondary malignant neoplasm of bone: Secondary | ICD-10-CM | POA: Insufficient documentation

## 2020-11-19 DIAGNOSIS — D61818 Other pancytopenia: Secondary | ICD-10-CM | POA: Insufficient documentation

## 2020-11-19 DIAGNOSIS — C50912 Malignant neoplasm of unspecified site of left female breast: Secondary | ICD-10-CM

## 2020-11-19 DIAGNOSIS — M3214 Glomerular disease in systemic lupus erythematosus: Secondary | ICD-10-CM | POA: Insufficient documentation

## 2020-11-19 DIAGNOSIS — Z170 Estrogen receptor positive status [ER+]: Secondary | ICD-10-CM | POA: Insufficient documentation

## 2020-11-19 MED ORDER — TECHNETIUM TC 99M MEDRONATE IV KIT
20.0000 | PACK | Freq: Once | INTRAVENOUS | Status: AC | PRN
  Administered 2020-11-19: 19.591 via INTRAVENOUS

## 2020-11-20 ENCOUNTER — Other Ambulatory Visit: Payer: Self-pay | Admitting: Oncology

## 2020-11-20 NOTE — Progress Notes (Signed)
I called Melanie Holloway with the reent report which I agree shows progression. She has also had more hypercalcemia. I am ging to suggest capecitabine, xgeva and intensifying the EPO to try to get her Hb closer to 10
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
## 2020-11-27 ENCOUNTER — Inpatient Hospital Stay (HOSPITAL_BASED_OUTPATIENT_CLINIC_OR_DEPARTMENT_OTHER): Payer: No Typology Code available for payment source | Admitting: Oncology

## 2020-11-27 ENCOUNTER — Other Ambulatory Visit: Payer: Self-pay

## 2020-11-27 ENCOUNTER — Telehealth: Payer: Self-pay | Admitting: Pharmacist

## 2020-11-27 ENCOUNTER — Other Ambulatory Visit: Payer: Self-pay | Admitting: *Deleted

## 2020-11-27 ENCOUNTER — Inpatient Hospital Stay: Payer: No Typology Code available for payment source

## 2020-11-27 ENCOUNTER — Other Ambulatory Visit (HOSPITAL_COMMUNITY): Payer: Self-pay | Admitting: Oncology

## 2020-11-27 ENCOUNTER — Inpatient Hospital Stay: Payer: No Typology Code available for payment source | Attending: Oncology

## 2020-11-27 ENCOUNTER — Telehealth: Payer: Self-pay

## 2020-11-27 ENCOUNTER — Other Ambulatory Visit: Payer: Self-pay | Admitting: Oncology

## 2020-11-27 VITALS — BP 122/62 | HR 95 | Temp 99.7°F | Resp 17 | Ht 59.0 in

## 2020-11-27 DIAGNOSIS — I2510 Atherosclerotic heart disease of native coronary artery without angina pectoris: Secondary | ICD-10-CM | POA: Insufficient documentation

## 2020-11-27 DIAGNOSIS — I313 Pericardial effusion (noninflammatory): Secondary | ICD-10-CM | POA: Diagnosis not present

## 2020-11-27 DIAGNOSIS — C50512 Malignant neoplasm of lower-outer quadrant of left female breast: Secondary | ICD-10-CM

## 2020-11-27 DIAGNOSIS — Z9012 Acquired absence of left breast and nipple: Secondary | ICD-10-CM | POA: Insufficient documentation

## 2020-11-27 DIAGNOSIS — Z79811 Long term (current) use of aromatase inhibitors: Secondary | ICD-10-CM | POA: Diagnosis not present

## 2020-11-27 DIAGNOSIS — C50912 Malignant neoplasm of unspecified site of left female breast: Secondary | ICD-10-CM | POA: Insufficient documentation

## 2020-11-27 DIAGNOSIS — D561 Beta thalassemia: Secondary | ICD-10-CM

## 2020-11-27 DIAGNOSIS — M3214 Glomerular disease in systemic lupus erythematosus: Secondary | ICD-10-CM | POA: Insufficient documentation

## 2020-11-27 DIAGNOSIS — Z923 Personal history of irradiation: Secondary | ICD-10-CM | POA: Insufficient documentation

## 2020-11-27 DIAGNOSIS — I700 Atherosclerosis of aorta: Secondary | ICD-10-CM | POA: Insufficient documentation

## 2020-11-27 DIAGNOSIS — I130 Hypertensive heart and chronic kidney disease with heart failure and stage 1 through stage 4 chronic kidney disease, or unspecified chronic kidney disease: Secondary | ICD-10-CM | POA: Diagnosis not present

## 2020-11-27 DIAGNOSIS — E785 Hyperlipidemia, unspecified: Secondary | ICD-10-CM

## 2020-11-27 DIAGNOSIS — Z170 Estrogen receptor positive status [ER+]: Secondary | ICD-10-CM

## 2020-11-27 DIAGNOSIS — Z809 Family history of malignant neoplasm, unspecified: Secondary | ICD-10-CM | POA: Insufficient documentation

## 2020-11-27 DIAGNOSIS — D61818 Other pancytopenia: Secondary | ICD-10-CM

## 2020-11-27 DIAGNOSIS — C50812 Malignant neoplasm of overlapping sites of left female breast: Secondary | ICD-10-CM

## 2020-11-27 DIAGNOSIS — D631 Anemia in chronic kidney disease: Secondary | ICD-10-CM | POA: Diagnosis present

## 2020-11-27 DIAGNOSIS — Z7189 Other specified counseling: Secondary | ICD-10-CM

## 2020-11-27 DIAGNOSIS — C7951 Secondary malignant neoplasm of bone: Secondary | ICD-10-CM | POA: Diagnosis not present

## 2020-11-27 DIAGNOSIS — C771 Secondary and unspecified malignant neoplasm of intrathoracic lymph nodes: Secondary | ICD-10-CM

## 2020-11-27 DIAGNOSIS — M329 Systemic lupus erythematosus, unspecified: Secondary | ICD-10-CM

## 2020-11-27 DIAGNOSIS — N184 Chronic kidney disease, stage 4 (severe): Secondary | ICD-10-CM | POA: Diagnosis present

## 2020-11-27 DIAGNOSIS — D649 Anemia, unspecified: Secondary | ICD-10-CM

## 2020-11-27 DIAGNOSIS — Z803 Family history of malignant neoplasm of breast: Secondary | ICD-10-CM | POA: Diagnosis not present

## 2020-11-27 DIAGNOSIS — N189 Chronic kidney disease, unspecified: Secondary | ICD-10-CM

## 2020-11-27 DIAGNOSIS — D509 Iron deficiency anemia, unspecified: Secondary | ICD-10-CM

## 2020-11-27 DIAGNOSIS — N179 Acute kidney failure, unspecified: Secondary | ICD-10-CM

## 2020-11-27 LAB — CBC WITH DIFFERENTIAL/PLATELET
Abs Immature Granulocytes: 0.11 10*3/uL — ABNORMAL HIGH (ref 0.00–0.07)
Basophils Absolute: 0 10*3/uL (ref 0.0–0.1)
Basophils Relative: 0 %
Eosinophils Absolute: 0 10*3/uL (ref 0.0–0.5)
Eosinophils Relative: 0 %
HCT: 18.3 % — ABNORMAL LOW (ref 36.0–46.0)
Hemoglobin: 5.7 g/dL — CL (ref 12.0–15.0)
Immature Granulocytes: 2 %
Lymphocytes Relative: 20 %
Lymphs Abs: 0.9 10*3/uL (ref 0.7–4.0)
MCH: 27.9 pg (ref 26.0–34.0)
MCHC: 31.1 g/dL (ref 30.0–36.0)
MCV: 89.7 fL (ref 80.0–100.0)
Monocytes Absolute: 0.3 10*3/uL (ref 0.1–1.0)
Monocytes Relative: 6 %
Neutro Abs: 3.3 10*3/uL (ref 1.7–7.7)
Neutrophils Relative %: 72 %
Platelets: 142 10*3/uL — ABNORMAL LOW (ref 150–400)
RBC: 2.04 MIL/uL — ABNORMAL LOW (ref 3.87–5.11)
RDW: 14.6 % (ref 11.5–15.5)
WBC: 4.6 10*3/uL (ref 4.0–10.5)
nRBC: 0.4 % — ABNORMAL HIGH (ref 0.0–0.2)

## 2020-11-27 LAB — COMPREHENSIVE METABOLIC PANEL
ALT: 6 U/L (ref 0–44)
AST: 17 U/L (ref 15–41)
Albumin: 3.2 g/dL — ABNORMAL LOW (ref 3.5–5.0)
Alkaline Phosphatase: 94 U/L (ref 38–126)
Anion gap: 10 (ref 5–15)
BUN: 28 mg/dL — ABNORMAL HIGH (ref 6–20)
CO2: 18 mmol/L — ABNORMAL LOW (ref 22–32)
Calcium: 5.9 mg/dL — CL (ref 8.9–10.3)
Chloride: 108 mmol/L (ref 98–111)
Creatinine, Ser: 1.78 mg/dL — ABNORMAL HIGH (ref 0.44–1.00)
GFR, Estimated: 33 mL/min — ABNORMAL LOW (ref >60)
Glucose, Bld: 106 mg/dL — ABNORMAL HIGH (ref 70–99)
Potassium: 3.5 mmol/L (ref 3.5–5.1)
Sodium: 136 mmol/L (ref 135–145)
Total Bilirubin: 0.3 mg/dL (ref 0.3–1.2)
Total Protein: 7.2 g/dL (ref 6.5–8.1)

## 2020-11-27 LAB — FERRITIN: Ferritin: 5031 ng/mL — ABNORMAL HIGH (ref 11–307)

## 2020-11-27 LAB — RETICULOCYTES
Immature Retic Fract: 23.4 % — ABNORMAL HIGH (ref 2.3–15.9)
RBC.: 2.03 MIL/uL — ABNORMAL LOW (ref 3.87–5.11)
Retic Count, Absolute: 26.6 10*3/uL (ref 19.0–186.0)
Retic Ct Pct: 1.3 % (ref 0.4–3.1)

## 2020-11-27 LAB — IRON AND TIBC
Iron: 141 ug/dL (ref 41–142)
Saturation Ratios: 65 % — ABNORMAL HIGH (ref 21–57)
TIBC: 218 ug/dL — ABNORMAL LOW (ref 236–444)
UIBC: 76 ug/dL — ABNORMAL LOW (ref 120–384)

## 2020-11-27 LAB — PREPARE RBC (CROSSMATCH)

## 2020-11-27 LAB — SAMPLE TO BLOOD BANK

## 2020-11-27 MED ORDER — CAPECITABINE 500 MG PO TABS: 1500.0000 mg | ORAL_TABLET | Freq: Two times a day (BID) | ORAL | 6 refills | Status: DC

## 2020-11-27 MED ORDER — DARBEPOETIN ALFA 500 MCG/ML IJ SOSY
PREFILLED_SYRINGE | INTRAMUSCULAR | Status: AC
  Filled 2020-11-27: qty 1

## 2020-11-27 MED ORDER — DARBEPOETIN ALFA 500 MCG/ML IJ SOSY
500.0000 ug | PREFILLED_SYRINGE | Freq: Once | INTRAMUSCULAR | Status: AC
  Administered 2020-11-27: 500 ug via SUBCUTANEOUS

## 2020-11-27 NOTE — Telephone Encounter (Signed)
Oral Oncology Patient Advocate Encounter  Received notification from [place] that prior authorization for Xeloda is required.  PA submitted on CoverMyMeds Key [id number] Status is pending  Oral Oncology Clinic will continue to follow.  [place] [facility] [place] Phone [phone] Fax [phone] 11/27/2020 10:17 AM

## 2020-11-27 NOTE — Progress Notes (Signed)
CRITICAL VALUE STICKER  CRITICAL VALUE: Hgb 5.7 and Calcium 5.9  DATE & TIME NOTIFIED: 11/27/2020 @ 1027  MESSENGER (representative from lab): Rosann Auerbach    MD NOTIFIED: Dr. Jana Hakim   RESPONSE: Arrange for 2 units of PRBC's for Hgb of 5.7, and hold xgeva injection to due to Calcium of 5.9.

## 2020-11-27 NOTE — Progress Notes (Signed)
Patient to receive 2 units of PRBC's at [facility].  Patient is set up for type and cross today @ 230 - Pt is aware.     Patient will be transfused on 2/11 @ 830, patient aware.    Per AD MD will place orders for transfusion.

## 2020-11-27 NOTE — Telephone Encounter (Signed)
CRITICAL VALUE STICKER  CRITICAL VALUE: Hgb = 5.7  RECEIVER (on-site recipient of call): Yetta Glassman, CMA  DATE & TIME NOTIFIED: 11/27/20 at 9:50am  MESSENGER (representative from lab): Pam  MD NOTIFIED: Wilber Bihari, NP  TIME OF NOTIFICATION:11/27/20 at 9:52am  RESPONSE: Notification given to Benjamine Mola, RN and Hassan Rowan, RN for follow-up with provider.

## 2020-11-27 NOTE — Patient Instructions (Signed)
Darbepoetin Alfa injection What is this medicine? DARBEPOETIN ALFA (dar be POE e tin AL fa) helps your body make more red blood cells. It is used to treat anemia caused by chronic kidney failure and chemotherapy. This medicine may be used for other purposes; ask your health care provider or pharmacist if you have questions. COMMON BRAND NAME(S): Aranesp What should I tell my health care provider before I take this medicine? They need to know if you have any of these conditions:  blood clotting disorders or history of blood clots  cancer patient not on chemotherapy  cystic fibrosis  heart disease, such as angina, heart failure, or a history of a heart attack  hemoglobin level of 12 g/dL or greater  high blood pressure  low levels of folate, iron, or vitamin B12  seizures  an unusual or allergic reaction to darbepoetin, erythropoietin, albumin, hamster proteins, latex, other medicines, foods, dyes, or preservatives  pregnant or trying to get pregnant  breast-feeding How should I use this medicine? This medicine is for injection into a vein or under the skin. It is usually given by a health care professional in a hospital or clinic setting. If you get this medicine at home, you will be taught how to prepare and give this medicine. Use exactly as directed. Take your medicine at regular intervals. Do not take your medicine more often than directed. It is important that you put your used needles and syringes in a special sharps container. Do not put them in a trash can. If you do not have a sharps container, call your pharmacist or healthcare provider to get one. A special MedGuide will be given to you by the pharmacist with each prescription and refill. Be sure to read this information carefully each time. Talk to your pediatrician regarding the use of this medicine in children. While this medicine may be used in children as Savahna Casados as 1 month of age for selected conditions, precautions do  apply. Overdosage: If you think you have taken too much of this medicine contact a poison control center or emergency room at once. NOTE: This medicine is only for you. Do not share this medicine with others. What if I miss a dose? If you miss a dose, take it as soon as you can. If it is almost time for your next dose, take only that dose. Do not take double or extra doses. What may interact with this medicine? Do not take this medicine with any of the following medications:  epoetin alfa This list may not describe all possible interactions. Give your health care provider a list of all the medicines, herbs, non-prescription drugs, or dietary supplements you use. Also tell them if you smoke, drink alcohol, or use illegal drugs. Some items may interact with your medicine. What should I watch for while using this medicine? Your condition will be monitored carefully while you are receiving this medicine. You may need blood work done while you are taking this medicine. This medicine may cause a decrease in vitamin B6. You should make sure that you get enough vitamin B6 while you are taking this medicine. Discuss the foods you eat and the vitamins you take with your health care professional. What side effects may I notice from receiving this medicine? Side effects that you should report to your doctor or health care professional as soon as possible:  allergic reactions like skin rash, itching or hives, swelling of the face, lips, or tongue  breathing problems  changes in   vision  chest pain  confusion, trouble speaking or understanding  feeling faint or lightheaded, falls  high blood pressure  muscle aches or pains  pain, swelling, warmth in the leg  rapid weight gain  severe headaches  sudden numbness or weakness of the face, arm or leg  trouble walking, dizziness, loss of balance or coordination  seizures (convulsions)  swelling of the ankles, feet, hands  unusually weak or  tired Side effects that usually do not require medical attention (report to your doctor or health care professional if they continue or are bothersome):  diarrhea  fever, chills (flu-like symptoms)  headaches  nausea, vomiting  redness, stinging, or swelling at site where injected This list may not describe all possible side effects. Call your doctor for medical advice about side effects. You may report side effects to FDA at 1-800-FDA-1088. Where should I keep my medicine? Keep out of the reach of children. Store in a refrigerator between 2 and 8 degrees C (36 and 46 degrees F). Do not freeze. Do not shake. Throw away any unused portion if using a single-dose vial. Throw away any unused medicine after the expiration date. NOTE: This sheet is a summary. It may not cover all possible information. If you have questions about this medicine, talk to your doctor, pharmacist, or health care provider.  2021 Elsevier/Gold Standard (2017-10-20 16:44:20)  

## 2020-11-28 ENCOUNTER — Other Ambulatory Visit (HOSPITAL_COMMUNITY): Payer: Self-pay | Admitting: Oncology

## 2020-11-28 ENCOUNTER — Telehealth: Payer: Self-pay | Admitting: Oncology

## 2020-11-28 LAB — TYPE AND SCREEN
ABO/RH(D): B POS
Antibody Screen: POSITIVE
DAT, IgG: POSITIVE

## 2020-11-28 LAB — CANCER ANTIGEN 27.29: CA 27.29: 244 U/mL — ABNORMAL HIGH (ref 0.0–38.6)

## 2020-11-28 MED ORDER — CAPECITABINE 500 MG PO TABS: 1000.0000 mg | ORAL_TABLET | Freq: Two times a day (BID) | ORAL | 6 refills | Status: DC

## 2020-11-28 NOTE — Telephone Encounter (Signed)
Oral Oncology Pharmacist Encounter  Received new prescription for Xeloda (capecitabine) for the treatment of metastatic HR positive, HER-2 negative breast cancer, planned duration until disease progression or unacceptable drug toxicity.  Prescription dose and frequency assessed for appropriateness. Confirmed with Dr. Jana Hakim that patient will be dose reduced to ~595 mg/m2 two times daily for 14 days on, 7 days off given renal dysfunction.   CBC w/ Diff and CMP from 11/27/20 assessed, noted patient with variable renal dysfunction, Scr 1.78 mg/dL (CrCl ~37 mL/min) - Xeloda dose reduced given patient's renal function. If patient's CrCl drops < 30 mL/min the use of Xeloda is contraindicated. Additionally, pt with Hgb of 5.7 g/dL - pt received 2 units PRBC on 11/27/20, pts pltc down to 142 K/uL. Recommend continued close monitoring of CBC w/ Diff and CMP while on therapy.    Current medication list in Epic reviewed, no relevant/significant DDIs with Xeloda identified.  Evaluated chart and no patient barriers to medication adherence noted.   Prescription has been e-scribed to the [facility] for benefits analysis and approval.  Oral Oncology Clinic will continue to follow for insurance authorization, copayment issues, initial counseling and start date.  Leron Croak, PharmD, BCPS Hematology/Oncology Clinical Pharmacist [facility] [phone] 11/28/2020 1:20 PM

## 2020-11-28 NOTE — Telephone Encounter (Signed)
Rescheduled 3/9 appts per 2/9 los. Called patient 2 times to review changes. Pt stated she is not feeling well and did not want to talk about it at this time. Sent reminder message to self to call pt back next week.

## 2020-11-28 NOTE — Telephone Encounter (Signed)
Oral Oncology Pharmacist Encounter   Prior Authorization for Xeloda (capecitabine) has been approved.     PA reference # [id number] Effective dates: 11/28/20 through 11/28/21  Patient's insurance requires Xeloda be filled through [facility]. Prescription redirected to their pharmacy for dispensing.    Oral Oncology Clinic will continue to follow.   Leron Croak, PharmD, BCPS Hematology/Oncology Clinical Pharmacist [name] [phone] 11/28/2020 2:54 PM

## 2020-11-29 ENCOUNTER — Inpatient Hospital Stay: Payer: No Typology Code available for payment source

## 2020-11-29 VITALS — BP 136/83 | HR 94 | Temp 98.7°F | Resp 18

## 2020-11-29 DIAGNOSIS — Z809 Family history of malignant neoplasm, unspecified: Secondary | ICD-10-CM | POA: Diagnosis not present

## 2020-11-29 DIAGNOSIS — Z170 Estrogen receptor positive status [ER+]: Secondary | ICD-10-CM | POA: Diagnosis not present

## 2020-11-29 DIAGNOSIS — N184 Chronic kidney disease, stage 4 (severe): Secondary | ICD-10-CM | POA: Diagnosis present

## 2020-11-29 DIAGNOSIS — D509 Iron deficiency anemia, unspecified: Secondary | ICD-10-CM

## 2020-11-29 DIAGNOSIS — Z9012 Acquired absence of left breast and nipple: Secondary | ICD-10-CM | POA: Diagnosis not present

## 2020-11-29 DIAGNOSIS — I2510 Atherosclerotic heart disease of native coronary artery without angina pectoris: Secondary | ICD-10-CM | POA: Diagnosis not present

## 2020-11-29 DIAGNOSIS — Z79811 Long term (current) use of aromatase inhibitors: Secondary | ICD-10-CM | POA: Diagnosis not present

## 2020-11-29 DIAGNOSIS — D649 Anemia, unspecified: Secondary | ICD-10-CM

## 2020-11-29 DIAGNOSIS — D631 Anemia in chronic kidney disease: Secondary | ICD-10-CM | POA: Diagnosis present

## 2020-11-29 DIAGNOSIS — R112 Nausea with vomiting, unspecified: Secondary | ICD-10-CM

## 2020-11-29 DIAGNOSIS — Z803 Family history of malignant neoplasm of breast: Secondary | ICD-10-CM | POA: Diagnosis not present

## 2020-11-29 DIAGNOSIS — I700 Atherosclerosis of aorta: Secondary | ICD-10-CM | POA: Diagnosis not present

## 2020-11-29 DIAGNOSIS — C50912 Malignant neoplasm of unspecified site of left female breast: Secondary | ICD-10-CM | POA: Diagnosis not present

## 2020-11-29 DIAGNOSIS — I313 Pericardial effusion (noninflammatory): Secondary | ICD-10-CM | POA: Diagnosis not present

## 2020-11-29 DIAGNOSIS — I130 Hypertensive heart and chronic kidney disease with heart failure and stage 1 through stage 4 chronic kidney disease, or unspecified chronic kidney disease: Secondary | ICD-10-CM | POA: Diagnosis not present

## 2020-11-29 DIAGNOSIS — M3214 Glomerular disease in systemic lupus erythematosus: Secondary | ICD-10-CM | POA: Diagnosis not present

## 2020-11-29 DIAGNOSIS — Z923 Personal history of irradiation: Secondary | ICD-10-CM | POA: Diagnosis not present

## 2020-11-29 LAB — PREPARE RBC (CROSSMATCH)

## 2020-11-29 MED ORDER — ACETAMINOPHEN 325 MG PO TABS
650.0000 mg | ORAL_TABLET | Freq: Once | ORAL | Status: AC
  Administered 2020-11-29: 650 mg via ORAL
  Filled 2020-11-29: qty 2

## 2020-11-29 MED ORDER — SODIUM CHLORIDE 0.9% IV SOLUTION
250.0000 mL | Freq: Once | INTRAVENOUS | Status: AC
  Administered 2020-11-29: 250 mL via INTRAVENOUS
  Filled 2020-11-29: qty 250

## 2020-11-29 MED ORDER — ONDANSETRON HCL 4 MG/2ML IJ SOLN
8.0000 mg | Freq: Once | INTRAMUSCULAR | Status: AC
  Administered 2020-11-29: 8 mg via INTRAVENOUS
  Filled 2020-11-29: qty 4

## 2020-11-29 MED ORDER — DIPHENHYDRAMINE HCL 25 MG PO CAPS
25.0000 mg | ORAL_CAPSULE | Freq: Once | ORAL | Status: AC
  Administered 2020-11-29: 25 mg via ORAL
  Filled 2020-11-29: qty 1

## 2020-11-29 MED ORDER — SODIUM CHLORIDE 0.9% FLUSH
10.0000 mL | INTRAVENOUS | Status: DC | PRN
  Filled 2020-11-29: qty 10

## 2020-11-29 NOTE — Progress Notes (Signed)
Patient extremely weak and SOB upon arrival to infusion suite. Patient states SOB started yesterday and is new for her. Patient c/o pain and edema to left ankle and foot. States this is a past injury and "does this occasionally". Rulon Abide, NP to chairside to assess patient.  At approx. 1000, after taking Benadryl and Tylenol at 0955, patient vomited. Patient stated she started vomiting yesterday and has been unable to "hold anything down". Reports tolerating small sips of water this AM. Rulon Abide, NP made aware. Zofran 8MG  IV ordered.   Patient tolerated transfusion well. States she feels "much better" than when she first came in. Patient to be discharged home with daughter.

## 2020-11-29 NOTE — Telephone Encounter (Signed)
Oral Oncology Patient Advocate Encounter  Prior Authorization for Xeloda has been approved.    PA# [id number] Effective dates: 11/28/20 through 11/28/21  Patient must fill at [facility] will continue to follow.    [place] [facility] [place] Phone [phone] Fax [phone] 11/29/2020 9:33 AM

## 2020-11-30 LAB — BPAM RBC
Blood Product Expiration Date: 202203082359
Blood Product Expiration Date: 202203092359
ISSUE DATE / TIME: 202202111005
ISSUE DATE / TIME: 202202111229
Unit Type and Rh: 7300
Unit Type and Rh: 7300

## 2020-11-30 LAB — TYPE AND SCREEN
ABO/RH(D): B POS
Antibody Screen: POSITIVE
DAT, IgG: POSITIVE
DAT, complement: NEGATIVE
Donor AG Type: NEGATIVE
Donor AG Type: NEGATIVE
Unit division: 0
Unit division: 0

## 2020-12-03 NOTE — Telephone Encounter (Signed)
Oral Chemotherapy Pharmacist Encounter  I spoke with patient for overview of: Xeloda (capecitabine) for the  treatment of metastatic HR positive, HER-2 negative breast cancer, planned duration until disease progression or unacceptable drug toxicity.  Counseled patient on administration, dosing, side effects, monitoring, drug-food interactions, safe handling, storage, and disposal.  Patient will take Xeloda 55m tablets, 2 tablets (10072m by mouth in AM and 2 tabs (100034mby mouth in PM, within 30 minutes of finishing meals, for 14 days on, 7 days off, repeated every 21 days.  Xeloda start date: 12/05/20  Adverse effects include but are not limited to: fatigue, decreased blood counts, GI upset, diarrhea, mouth sores, and hand-foot syndrome.  Patient will obtain anti diarrheal and alert the office of 4 or more loose stools above baseline.  Reviewed with patient importance of keeping a medication schedule and plan for any missed doses. No barriers to medication adherence identified.  Medication reconciliation performed and medication/allergy list updated.  Patient's insurance requires that [facility] [place] [facility] [place] 12/04/20 and patient will pick up medication at that time.  All questions answered.  Ms. GraSeniced understanding and appreciation.   Medication education handout placed in mail for patient. Patient knows to call the office with questions or concerns. Oral Chemotherapy Clinic phone number provided to patient.   RebLeron CroakharmD, BCPS Hematology/Oncology Clinical Pharmacist [phone]/2022 2:05 PM

## 2020-12-11 ENCOUNTER — Ambulatory Visit: Payer: No Typology Code available for payment source

## 2020-12-25 ENCOUNTER — Inpatient Hospital Stay: Payer: No Typology Code available for payment source

## 2020-12-25 ENCOUNTER — Other Ambulatory Visit: Payer: No Typology Code available for payment source

## 2020-12-25 ENCOUNTER — Ambulatory Visit: Payer: No Typology Code available for payment source

## 2020-12-25 ENCOUNTER — Inpatient Hospital Stay: Payer: No Typology Code available for payment source | Attending: Oncology | Admitting: Oncology

## 2020-12-25 ENCOUNTER — Other Ambulatory Visit: Payer: Self-pay

## 2020-12-25 ENCOUNTER — Telehealth: Payer: Self-pay | Admitting: Oncology

## 2020-12-25 VITALS — BP 126/79 | HR 100 | Temp 97.3°F | Resp 18 | Ht 59.0 in | Wt 141.5 lb

## 2020-12-25 DIAGNOSIS — D61818 Other pancytopenia: Secondary | ICD-10-CM

## 2020-12-25 DIAGNOSIS — Z79810 Long term (current) use of selective estrogen receptor modulators (SERMs): Secondary | ICD-10-CM | POA: Insufficient documentation

## 2020-12-25 DIAGNOSIS — C50512 Malignant neoplasm of lower-outer quadrant of left female breast: Secondary | ICD-10-CM | POA: Insufficient documentation

## 2020-12-25 DIAGNOSIS — Z803 Family history of malignant neoplasm of breast: Secondary | ICD-10-CM | POA: Insufficient documentation

## 2020-12-25 DIAGNOSIS — I700 Atherosclerosis of aorta: Secondary | ICD-10-CM

## 2020-12-25 DIAGNOSIS — M329 Systemic lupus erythematosus, unspecified: Secondary | ICD-10-CM

## 2020-12-25 DIAGNOSIS — R519 Headache, unspecified: Secondary | ICD-10-CM | POA: Insufficient documentation

## 2020-12-25 DIAGNOSIS — Z170 Estrogen receptor positive status [ER+]: Secondary | ICD-10-CM

## 2020-12-25 DIAGNOSIS — D631 Anemia in chronic kidney disease: Secondary | ICD-10-CM | POA: Insufficient documentation

## 2020-12-25 DIAGNOSIS — Z833 Family history of diabetes mellitus: Secondary | ICD-10-CM | POA: Insufficient documentation

## 2020-12-25 DIAGNOSIS — D649 Anemia, unspecified: Secondary | ICD-10-CM

## 2020-12-25 DIAGNOSIS — M3214 Glomerular disease in systemic lupus erythematosus: Secondary | ICD-10-CM | POA: Diagnosis not present

## 2020-12-25 DIAGNOSIS — C771 Secondary and unspecified malignant neoplasm of intrathoracic lymph nodes: Secondary | ICD-10-CM | POA: Diagnosis not present

## 2020-12-25 DIAGNOSIS — C50812 Malignant neoplasm of overlapping sites of left female breast: Secondary | ICD-10-CM

## 2020-12-25 DIAGNOSIS — R197 Diarrhea, unspecified: Secondary | ICD-10-CM | POA: Diagnosis not present

## 2020-12-25 DIAGNOSIS — Z79899 Other long term (current) drug therapy: Secondary | ICD-10-CM | POA: Insufficient documentation

## 2020-12-25 DIAGNOSIS — C7951 Secondary malignant neoplasm of bone: Secondary | ICD-10-CM | POA: Insufficient documentation

## 2020-12-25 DIAGNOSIS — N189 Chronic kidney disease, unspecified: Secondary | ICD-10-CM | POA: Diagnosis not present

## 2020-12-25 DIAGNOSIS — Z8249 Family history of ischemic heart disease and other diseases of the circulatory system: Secondary | ICD-10-CM | POA: Diagnosis not present

## 2020-12-25 DIAGNOSIS — Z9012 Acquired absence of left breast and nipple: Secondary | ICD-10-CM | POA: Diagnosis not present

## 2020-12-25 DIAGNOSIS — I509 Heart failure, unspecified: Secondary | ICD-10-CM | POA: Diagnosis not present

## 2020-12-25 DIAGNOSIS — I130 Hypertensive heart and chronic kidney disease with heart failure and stage 1 through stage 4 chronic kidney disease, or unspecified chronic kidney disease: Secondary | ICD-10-CM | POA: Insufficient documentation

## 2020-12-25 DIAGNOSIS — N184 Chronic kidney disease, stage 4 (severe): Secondary | ICD-10-CM | POA: Insufficient documentation

## 2020-12-25 DIAGNOSIS — Z923 Personal history of irradiation: Secondary | ICD-10-CM | POA: Insufficient documentation

## 2020-12-25 DIAGNOSIS — D561 Beta thalassemia: Secondary | ICD-10-CM

## 2020-12-25 DIAGNOSIS — Z6828 Body mass index (BMI) 28.0-28.9, adult: Secondary | ICD-10-CM | POA: Insufficient documentation

## 2020-12-25 DIAGNOSIS — Z9221 Personal history of antineoplastic chemotherapy: Secondary | ICD-10-CM | POA: Insufficient documentation

## 2020-12-25 LAB — CBC WITH DIFFERENTIAL/PLATELET
Abs Immature Granulocytes: 0.03 10*3/uL (ref 0.00–0.07)
Basophils Absolute: 0 10*3/uL (ref 0.0–0.1)
Basophils Relative: 0 %
Eosinophils Absolute: 0 10*3/uL (ref 0.0–0.5)
Eosinophils Relative: 0 %
HCT: 27.5 % — ABNORMAL LOW (ref 36.0–46.0)
Hemoglobin: 8.8 g/dL — ABNORMAL LOW (ref 12.0–15.0)
Immature Granulocytes: 1 %
Lymphocytes Relative: 29 %
Lymphs Abs: 0.6 10*3/uL — ABNORMAL LOW (ref 0.7–4.0)
MCH: 28.6 pg (ref 26.0–34.0)
MCHC: 32 g/dL (ref 30.0–36.0)
MCV: 89.3 fL (ref 80.0–100.0)
Monocytes Absolute: 0.1 10*3/uL (ref 0.1–1.0)
Monocytes Relative: 5 %
Neutro Abs: 1.5 10*3/uL — ABNORMAL LOW (ref 1.7–7.7)
Neutrophils Relative %: 65 %
Platelets: 117 10*3/uL — ABNORMAL LOW (ref 150–400)
RBC: 3.08 MIL/uL — ABNORMAL LOW (ref 3.87–5.11)
RDW: 14.3 % (ref 11.5–15.5)
WBC: 2.2 10*3/uL — ABNORMAL LOW (ref 4.0–10.5)
nRBC: 0 % (ref 0.0–0.2)

## 2020-12-25 LAB — RETICULOCYTES
Immature Retic Fract: 18.9 % — ABNORMAL HIGH (ref 2.3–15.9)
RBC.: 3.08 MIL/uL — ABNORMAL LOW (ref 3.87–5.11)
Retic Count, Absolute: 33.9 10*3/uL (ref 19.0–186.0)
Retic Ct Pct: 1.1 % (ref 0.4–3.1)

## 2020-12-25 LAB — COMPREHENSIVE METABOLIC PANEL
ALT: 6 U/L (ref 0–44)
AST: 16 U/L (ref 15–41)
Albumin: 3.7 g/dL (ref 3.5–5.0)
Alkaline Phosphatase: 117 U/L (ref 38–126)
Anion gap: 11 (ref 5–15)
BUN: 38 mg/dL — ABNORMAL HIGH (ref 6–20)
CO2: 21 mmol/L — ABNORMAL LOW (ref 22–32)
Calcium: 7.9 mg/dL — ABNORMAL LOW (ref 8.9–10.3)
Chloride: 104 mmol/L (ref 98–111)
Creatinine, Ser: 1.81 mg/dL — ABNORMAL HIGH (ref 0.44–1.00)
GFR, Estimated: 32 mL/min — ABNORMAL LOW (ref >60)
Glucose, Bld: 116 mg/dL — ABNORMAL HIGH (ref 70–99)
Potassium: 4 mmol/L (ref 3.5–5.1)
Sodium: 136 mmol/L (ref 135–145)
Total Bilirubin: 0.4 mg/dL (ref 0.3–1.2)
Total Protein: 8 g/dL (ref 6.5–8.1)

## 2020-12-25 LAB — SAMPLE TO BLOOD BANK

## 2020-12-25 MED ORDER — LOPERAMIDE HCL 2 MG PO CAPS: 2.0000 mg | ORAL_CAPSULE | ORAL | 0 refills | Status: AC | PRN

## 2020-12-25 NOTE — Progress Notes (Signed)
Pt will not get xgeva injection today per Dr. Jana Hakim calcium 7.9
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
## 2020-12-25 NOTE — Telephone Encounter (Signed)
Scheduled appts per 3/9 los. Gave pt a print out of AVS.  

## 2020-12-26 LAB — IRON AND TIBC
Iron: 219 ug/dL — ABNORMAL HIGH (ref 41–142)
Saturation Ratios: 98 % — ABNORMAL HIGH (ref 21–57)
TIBC: 224 ug/dL — ABNORMAL LOW (ref 236–444)
UIBC: 5 ug/dL — ABNORMAL LOW (ref 120–384)

## 2020-12-26 LAB — FERRITIN: Ferritin: 4944 ng/mL — ABNORMAL HIGH (ref 11–307)

## 2020-12-26 LAB — CANCER ANTIGEN 27.29: CA 27.29: 272.6 U/mL — ABNORMAL HIGH (ref 0.0–38.6)

## 2021-01-11 ENCOUNTER — Other Ambulatory Visit: Payer: Self-pay | Admitting: Oncology

## 2021-01-22 ENCOUNTER — Inpatient Hospital Stay: Payer: No Typology Code available for payment source

## 2021-01-22 ENCOUNTER — Telehealth: Payer: Self-pay | Admitting: *Deleted

## 2021-01-22 ENCOUNTER — Ambulatory Visit: Payer: No Typology Code available for payment source

## 2021-01-22 ENCOUNTER — Other Ambulatory Visit: Payer: Self-pay

## 2021-01-22 ENCOUNTER — Inpatient Hospital Stay: Payer: No Typology Code available for payment source | Attending: Oncology

## 2021-01-22 VITALS — BP 96/66 | HR 82 | Temp 97.8°F | Resp 16

## 2021-01-22 DIAGNOSIS — R519 Headache, unspecified: Secondary | ICD-10-CM | POA: Diagnosis not present

## 2021-01-22 DIAGNOSIS — N184 Chronic kidney disease, stage 4 (severe): Secondary | ICD-10-CM | POA: Insufficient documentation

## 2021-01-22 DIAGNOSIS — I130 Hypertensive heart and chronic kidney disease with heart failure and stage 1 through stage 4 chronic kidney disease, or unspecified chronic kidney disease: Secondary | ICD-10-CM | POA: Insufficient documentation

## 2021-01-22 DIAGNOSIS — Z170 Estrogen receptor positive status [ER+]: Secondary | ICD-10-CM | POA: Insufficient documentation

## 2021-01-22 DIAGNOSIS — Z803 Family history of malignant neoplasm of breast: Secondary | ICD-10-CM | POA: Diagnosis not present

## 2021-01-22 DIAGNOSIS — Z9012 Acquired absence of left breast and nipple: Secondary | ICD-10-CM | POA: Diagnosis not present

## 2021-01-22 DIAGNOSIS — C7951 Secondary malignant neoplasm of bone: Secondary | ICD-10-CM

## 2021-01-22 DIAGNOSIS — N179 Acute kidney failure, unspecified: Secondary | ICD-10-CM

## 2021-01-22 DIAGNOSIS — Z9221 Personal history of antineoplastic chemotherapy: Secondary | ICD-10-CM | POA: Insufficient documentation

## 2021-01-22 DIAGNOSIS — C50512 Malignant neoplasm of lower-outer quadrant of left female breast: Secondary | ICD-10-CM | POA: Insufficient documentation

## 2021-01-22 DIAGNOSIS — Z6828 Body mass index (BMI) 28.0-28.9, adult: Secondary | ICD-10-CM | POA: Insufficient documentation

## 2021-01-22 DIAGNOSIS — Z833 Family history of diabetes mellitus: Secondary | ICD-10-CM | POA: Diagnosis not present

## 2021-01-22 DIAGNOSIS — I700 Atherosclerosis of aorta: Secondary | ICD-10-CM

## 2021-01-22 DIAGNOSIS — I509 Heart failure, unspecified: Secondary | ICD-10-CM | POA: Diagnosis not present

## 2021-01-22 DIAGNOSIS — Z79810 Long term (current) use of selective estrogen receptor modulators (SERMs): Secondary | ICD-10-CM | POA: Diagnosis not present

## 2021-01-22 DIAGNOSIS — R197 Diarrhea, unspecified: Secondary | ICD-10-CM | POA: Diagnosis not present

## 2021-01-22 DIAGNOSIS — D631 Anemia in chronic kidney disease: Secondary | ICD-10-CM | POA: Diagnosis present

## 2021-01-22 DIAGNOSIS — Z923 Personal history of irradiation: Secondary | ICD-10-CM | POA: Diagnosis not present

## 2021-01-22 DIAGNOSIS — Z79899 Other long term (current) drug therapy: Secondary | ICD-10-CM | POA: Insufficient documentation

## 2021-01-22 DIAGNOSIS — Z8249 Family history of ischemic heart disease and other diseases of the circulatory system: Secondary | ICD-10-CM | POA: Insufficient documentation

## 2021-01-22 DIAGNOSIS — M329 Systemic lupus erythematosus, unspecified: Secondary | ICD-10-CM

## 2021-01-22 DIAGNOSIS — N189 Chronic kidney disease, unspecified: Secondary | ICD-10-CM

## 2021-01-22 DIAGNOSIS — D61818 Other pancytopenia: Secondary | ICD-10-CM

## 2021-01-22 DIAGNOSIS — D561 Beta thalassemia: Secondary | ICD-10-CM

## 2021-01-22 DIAGNOSIS — M3214 Glomerular disease in systemic lupus erythematosus: Secondary | ICD-10-CM | POA: Diagnosis not present

## 2021-01-22 DIAGNOSIS — C771 Secondary and unspecified malignant neoplasm of intrathoracic lymph nodes: Secondary | ICD-10-CM

## 2021-01-22 DIAGNOSIS — C50812 Malignant neoplasm of overlapping sites of left female breast: Secondary | ICD-10-CM

## 2021-01-22 LAB — RETICULOCYTES
Immature Retic Fract: 18.1 % — ABNORMAL HIGH (ref 2.3–15.9)
RBC.: 1.92 MIL/uL — ABNORMAL LOW (ref 3.87–5.11)
Retic Count, Absolute: 27.8 10*3/uL (ref 19.0–186.0)
Retic Ct Pct: 1.5 % (ref 0.4–3.1)

## 2021-01-22 LAB — COMPREHENSIVE METABOLIC PANEL
ALT: <6 U/L (ref 0–44)
AST: 13 U/L — ABNORMAL LOW (ref 15–41)
Albumin: 3.4 g/dL — ABNORMAL LOW (ref 3.5–5.0)
Alkaline Phosphatase: 104 U/L (ref 38–126)
Anion gap: 16 — ABNORMAL HIGH (ref 5–15)
BUN: 50 mg/dL — ABNORMAL HIGH (ref 6–20)
CO2: 16 mmol/L — ABNORMAL LOW (ref 22–32)
Calcium: 7.3 mg/dL — ABNORMAL LOW (ref 8.9–10.3)
Chloride: 103 mmol/L (ref 98–111)
Creatinine, Ser: 1.69 mg/dL — ABNORMAL HIGH (ref 0.44–1.00)
GFR, Estimated: 35 mL/min — ABNORMAL LOW (ref >60)
Glucose, Bld: 94 mg/dL (ref 70–99)
Potassium: 3.6 mmol/L (ref 3.5–5.1)
Sodium: 135 mmol/L (ref 135–145)
Total Bilirubin: 0.3 mg/dL (ref 0.3–1.2)
Total Protein: 6.9 g/dL (ref 6.5–8.1)

## 2021-01-22 LAB — CBC WITH DIFFERENTIAL/PLATELET
Abs Immature Granulocytes: 0.05 10*3/uL (ref 0.00–0.07)
Basophils Absolute: 0 10*3/uL (ref 0.0–0.1)
Basophils Relative: 0 %
Eosinophils Absolute: 0 10*3/uL (ref 0.0–0.5)
Eosinophils Relative: 0 %
HCT: 17.3 % — ABNORMAL LOW (ref 36.0–46.0)
Hemoglobin: 5.6 g/dL — CL (ref 12.0–15.0)
Immature Granulocytes: 1 %
Lymphocytes Relative: 22 %
Lymphs Abs: 0.8 10*3/uL (ref 0.7–4.0)
MCH: 28.7 pg (ref 26.0–34.0)
MCHC: 32.4 g/dL (ref 30.0–36.0)
MCV: 88.7 fL (ref 80.0–100.0)
Monocytes Absolute: 0.1 10*3/uL (ref 0.1–1.0)
Monocytes Relative: 4 %
Neutro Abs: 2.5 10*3/uL (ref 1.7–7.7)
Neutrophils Relative %: 73 %
Platelets: 90 10*3/uL — ABNORMAL LOW (ref 150–400)
RBC: 1.95 MIL/uL — ABNORMAL LOW (ref 3.87–5.11)
RDW: 15 % (ref 11.5–15.5)
WBC: 3.5 10*3/uL — ABNORMAL LOW (ref 4.0–10.5)
nRBC: 0 % (ref 0.0–0.2)

## 2021-01-22 LAB — FERRITIN: Ferritin: 4505 ng/mL — ABNORMAL HIGH (ref 11–307)

## 2021-01-22 LAB — SAMPLE TO BLOOD BANK

## 2021-01-22 LAB — IRON AND TIBC
Iron: 218 ug/dL — ABNORMAL HIGH (ref 41–142)
Saturation Ratios: 97 % — ABNORMAL HIGH (ref 21–57)
TIBC: 225 ug/dL — ABNORMAL LOW (ref 236–444)
UIBC: 7 ug/dL — ABNORMAL LOW (ref 120–384)

## 2021-01-22 LAB — PREPARE RBC (CROSSMATCH)

## 2021-01-22 MED ORDER — DARBEPOETIN ALFA 500 MCG/ML IJ SOSY
PREFILLED_SYRINGE | INTRAMUSCULAR | Status: AC
  Filled 2021-01-22: qty 1

## 2021-01-22 MED ORDER — DARBEPOETIN ALFA 500 MCG/ML IJ SOSY
500.0000 ug | PREFILLED_SYRINGE | Freq: Once | INTRAMUSCULAR | Status: AC
  Administered 2021-01-22: 500 ug via SUBCUTANEOUS

## 2021-01-22 NOTE — Patient Instructions (Signed)
Darbepoetin Alfa injection What is this medicine? DARBEPOETIN ALFA (dar be POE e tin AL fa) helps your body make more red blood cells. It is used to treat anemia caused by chronic kidney failure and chemotherapy. This medicine may be used for other purposes; ask your health care provider or pharmacist if you have questions. COMMON BRAND NAME(S): Aranesp What should I tell my health care provider before I take this medicine? They need to know if you have any of these conditions:  blood clotting disorders or history of blood clots  cancer patient not on chemotherapy  cystic fibrosis  heart disease, such as angina, heart failure, or a history of a heart attack  hemoglobin level of 12 g/dL or greater  high blood pressure  low levels of folate, iron, or vitamin B12  seizures  an unusual or allergic reaction to darbepoetin, erythropoietin, albumin, hamster proteins, latex, other medicines, foods, dyes, or preservatives  pregnant or trying to get pregnant  breast-feeding How should I use this medicine? This medicine is for injection into a vein or under the skin. It is usually given by a health care professional in a hospital or clinic setting. If you get this medicine at home, you will be taught how to prepare and give this medicine. Use exactly as directed. Take your medicine at regular intervals. Do not take your medicine more often than directed. It is important that you put your used needles and syringes in a special sharps container. Do not put them in a trash can. If you do not have a sharps container, call your pharmacist or healthcare provider to get one. A special MedGuide will be given to you by the pharmacist with each prescription and refill. Be sure to read this information carefully each time. Talk to your pediatrician regarding the use of this medicine in children. While this medicine may be used in children as young as 1 month of age for selected conditions, precautions do  apply. Overdosage: If you think you have taken too much of this medicine contact a poison control center or emergency room at once. NOTE: This medicine is only for you. Do not share this medicine with others. What if I miss a dose? If you miss a dose, take it as soon as you can. If it is almost time for your next dose, take only that dose. Do not take double or extra doses. What may interact with this medicine? Do not take this medicine with any of the following medications:  epoetin alfa This list may not describe all possible interactions. Give your health care provider a list of all the medicines, herbs, non-prescription drugs, or dietary supplements you use. Also tell them if you smoke, drink alcohol, or use illegal drugs. Some items may interact with your medicine. What should I watch for while using this medicine? Your condition will be monitored carefully while you are receiving this medicine. You may need blood work done while you are taking this medicine. This medicine may cause a decrease in vitamin B6. You should make sure that you get enough vitamin B6 while you are taking this medicine. Discuss the foods you eat and the vitamins you take with your health care professional. What side effects may I notice from receiving this medicine? Side effects that you should report to your doctor or health care professional as soon as possible:  allergic reactions like skin rash, itching or hives, swelling of the face, lips, or tongue  breathing problems  changes in   vision  chest pain  confusion, trouble speaking or understanding  feeling faint or lightheaded, falls  high blood pressure  muscle aches or pains  pain, swelling, warmth in the leg  rapid weight gain  severe headaches  sudden numbness or weakness of the face, arm or leg  trouble walking, dizziness, loss of balance or coordination  seizures (convulsions)  swelling of the ankles, feet, hands  unusually weak or  tired Side effects that usually do not require medical attention (report to your doctor or health care professional if they continue or are bothersome):  diarrhea  fever, chills (flu-like symptoms)  headaches  nausea, vomiting  redness, stinging, or swelling at site where injected This list may not describe all possible side effects. Call your doctor for medical advice about side effects. You may report side effects to FDA at 1-800-FDA-1088. Where should I keep my medicine? Keep out of the reach of children. Store in a refrigerator between 2 and 8 degrees C (36 and 46 degrees F). Do not freeze. Do not shake. Throw away any unused portion if using a single-dose vial. Throw away any unused medicine after the expiration date. NOTE: This sheet is a summary. It may not cover all possible information. If you have questions about this medicine, talk to your doctor, pharmacist, or health care provider.  2021 Elsevier/Gold Standard (2017-10-20 16:44:20)  

## 2021-01-22 NOTE — Telephone Encounter (Signed)
CRITICAL VALUE STICKER  CRITICAL VALUE: Hgb 5.6  RECEIVER Velna Ochs RN  DATE & TIME NOTIFIED: 01/22/21 @ 1013  MESSENGER (representative from lab): H. Flynt  MD NOTIFIED: Dr. Jana Hakim Annell Greening RN notified)  TIME OF NOTIFICATION:01/22/21 @ 1018  RESPONSE: Information acknowledged/RN to inform MD

## 2021-01-22 NOTE — Progress Notes (Signed)
CRITICAL VALUE STICKER  CRITICAL VALUE: HGB 5.6  DATE & TIME NOTIFIED: 01/22/21 10:15 am  MD NOTIFIED: Magrinat  TIME OF NOTIFICATION: 10:20  RESPONSE: Pt to get 2 units PRBS

## 2021-01-23 ENCOUNTER — Inpatient Hospital Stay: Payer: No Typology Code available for payment source

## 2021-01-23 ENCOUNTER — Other Ambulatory Visit: Payer: Self-pay

## 2021-01-23 DIAGNOSIS — Z170 Estrogen receptor positive status [ER+]: Secondary | ICD-10-CM

## 2021-01-23 DIAGNOSIS — I130 Hypertensive heart and chronic kidney disease with heart failure and stage 1 through stage 4 chronic kidney disease, or unspecified chronic kidney disease: Secondary | ICD-10-CM | POA: Diagnosis not present

## 2021-01-23 DIAGNOSIS — C50812 Malignant neoplasm of overlapping sites of left female breast: Secondary | ICD-10-CM

## 2021-01-23 LAB — CANCER ANTIGEN 27.29: CA 27.29: 414.7 U/mL — ABNORMAL HIGH (ref 0.0–38.6)

## 2021-01-23 MED ORDER — ACETAMINOPHEN 325 MG PO TABS
650.0000 mg | ORAL_TABLET | Freq: Once | ORAL | Status: AC
Start: 2021-01-23 — End: 2021-01-23
  Administered 2021-01-23: 650 mg via ORAL

## 2021-01-23 MED ORDER — ACETAMINOPHEN 325 MG PO TABS
ORAL_TABLET | ORAL | Status: AC
  Filled 2021-01-23: qty 2

## 2021-01-23 MED ORDER — ONDANSETRON HCL 8 MG PO TABS
ORAL_TABLET | ORAL | Status: AC
  Filled 2021-01-23: qty 1

## 2021-01-23 MED ORDER — ONDANSETRON HCL 8 MG PO TABS
8.0000 mg | ORAL_TABLET | Freq: Once | ORAL | Status: AC
  Administered 2021-01-23: 8 mg via ORAL

## 2021-01-23 MED ORDER — DIPHENHYDRAMINE HCL 25 MG PO CAPS
25.0000 mg | ORAL_CAPSULE | Freq: Once | ORAL | Status: AC
Start: 2021-01-23 — End: 2021-01-23
  Administered 2021-01-23: 25 mg via ORAL

## 2021-01-23 MED ORDER — LOPERAMIDE HCL 2 MG PO CAPS
2.0000 mg | ORAL_CAPSULE | ORAL | Status: DC | PRN
  Administered 2021-01-23: 2 mg via ORAL

## 2021-01-23 MED ORDER — LOPERAMIDE HCL 2 MG PO CAPS
ORAL_CAPSULE | ORAL | Status: AC
  Filled 2021-01-23: qty 1

## 2021-01-23 MED ORDER — DIPHENHYDRAMINE HCL 25 MG PO CAPS
ORAL_CAPSULE | ORAL | Status: AC
  Filled 2021-01-23: qty 1

## 2021-01-23 MED ORDER — SODIUM CHLORIDE 0.9% IV SOLUTION
250.0000 mL | Freq: Once | INTRAVENOUS | Status: AC
  Administered 2021-01-23: 250 mL via INTRAVENOUS
  Filled 2021-01-23: qty 250

## 2021-01-23 NOTE — Patient Instructions (Signed)

## 2021-01-24 ENCOUNTER — Ambulatory Visit
Admission: RE | Admit: 2021-01-24 | Discharge: 2021-01-24 | Disposition: A | Discharge status: Home / Self Care | Payer: No Typology Code available for payment source | Source: Ambulatory Visit | Attending: Adult Health | Admitting: Adult Health

## 2021-01-24 ENCOUNTER — Other Ambulatory Visit: Payer: Self-pay

## 2021-01-24 DIAGNOSIS — Z1231 Encounter for screening mammogram for malignant neoplasm of breast: Secondary | ICD-10-CM

## 2021-01-24 LAB — TYPE AND SCREEN
ABO/RH(D): B POS
Antibody Screen: POSITIVE
DAT, IgG: POSITIVE
Unit division: 0
Unit division: 0

## 2021-01-24 LAB — BPAM RBC
Blood Product Expiration Date: 202205052359
Blood Product Expiration Date: 202205052359
ISSUE DATE / TIME: 202204070845
ISSUE DATE / TIME: 202204070845
Unit Type and Rh: 1700
Unit Type and Rh: 5100

## 2021-02-19 ENCOUNTER — Other Ambulatory Visit: Payer: Self-pay

## 2021-02-19 ENCOUNTER — Other Ambulatory Visit: Payer: No Typology Code available for payment source

## 2021-02-19 ENCOUNTER — Inpatient Hospital Stay (HOSPITAL_BASED_OUTPATIENT_CLINIC_OR_DEPARTMENT_OTHER): Payer: No Typology Code available for payment source | Admitting: Oncology

## 2021-02-19 ENCOUNTER — Inpatient Hospital Stay: Payer: No Typology Code available for payment source

## 2021-02-19 ENCOUNTER — Ambulatory Visit: Payer: No Typology Code available for payment source

## 2021-02-19 ENCOUNTER — Inpatient Hospital Stay: Payer: No Typology Code available for payment source | Attending: Oncology

## 2021-02-19 VITALS — BP 102/66 | HR 86 | Temp 97.5°F | Resp 18 | Ht 59.0 in | Wt 138.7 lb

## 2021-02-19 DIAGNOSIS — Z9221 Personal history of antineoplastic chemotherapy: Secondary | ICD-10-CM | POA: Diagnosis not present

## 2021-02-19 DIAGNOSIS — I700 Atherosclerosis of aorta: Secondary | ICD-10-CM

## 2021-02-19 DIAGNOSIS — I509 Heart failure, unspecified: Secondary | ICD-10-CM | POA: Diagnosis not present

## 2021-02-19 DIAGNOSIS — C50812 Malignant neoplasm of overlapping sites of left female breast: Secondary | ICD-10-CM

## 2021-02-19 DIAGNOSIS — C7951 Secondary malignant neoplasm of bone: Secondary | ICD-10-CM

## 2021-02-19 DIAGNOSIS — C50912 Malignant neoplasm of unspecified site of left female breast: Secondary | ICD-10-CM | POA: Diagnosis not present

## 2021-02-19 DIAGNOSIS — C771 Secondary and unspecified malignant neoplasm of intrathoracic lymph nodes: Secondary | ICD-10-CM

## 2021-02-19 DIAGNOSIS — R197 Diarrhea, unspecified: Secondary | ICD-10-CM | POA: Diagnosis not present

## 2021-02-19 DIAGNOSIS — Z79899 Other long term (current) drug therapy: Secondary | ICD-10-CM | POA: Diagnosis not present

## 2021-02-19 DIAGNOSIS — Z79810 Long term (current) use of selective estrogen receptor modulators (SERMs): Secondary | ICD-10-CM | POA: Diagnosis not present

## 2021-02-19 DIAGNOSIS — Z803 Family history of malignant neoplasm of breast: Secondary | ICD-10-CM | POA: Diagnosis not present

## 2021-02-19 DIAGNOSIS — D61818 Other pancytopenia: Secondary | ICD-10-CM

## 2021-02-19 DIAGNOSIS — M3214 Glomerular disease in systemic lupus erythematosus: Secondary | ICD-10-CM

## 2021-02-19 DIAGNOSIS — N184 Chronic kidney disease, stage 4 (severe): Secondary | ICD-10-CM | POA: Insufficient documentation

## 2021-02-19 DIAGNOSIS — N179 Acute kidney failure, unspecified: Secondary | ICD-10-CM

## 2021-02-19 DIAGNOSIS — Z833 Family history of diabetes mellitus: Secondary | ICD-10-CM | POA: Diagnosis not present

## 2021-02-19 DIAGNOSIS — IMO0002 Reserved for concepts with insufficient information to code with codable children: Secondary | ICD-10-CM

## 2021-02-19 DIAGNOSIS — C50512 Malignant neoplasm of lower-outer quadrant of left female breast: Secondary | ICD-10-CM | POA: Insufficient documentation

## 2021-02-19 DIAGNOSIS — Z170 Estrogen receptor positive status [ER+]: Secondary | ICD-10-CM

## 2021-02-19 DIAGNOSIS — Z923 Personal history of irradiation: Secondary | ICD-10-CM | POA: Diagnosis not present

## 2021-02-19 DIAGNOSIS — D631 Anemia in chronic kidney disease: Secondary | ICD-10-CM | POA: Insufficient documentation

## 2021-02-19 DIAGNOSIS — Z7189 Other specified counseling: Secondary | ICD-10-CM

## 2021-02-19 DIAGNOSIS — M329 Systemic lupus erythematosus, unspecified: Secondary | ICD-10-CM

## 2021-02-19 DIAGNOSIS — D561 Beta thalassemia: Secondary | ICD-10-CM

## 2021-02-19 DIAGNOSIS — I130 Hypertensive heart and chronic kidney disease with heart failure and stage 1 through stage 4 chronic kidney disease, or unspecified chronic kidney disease: Secondary | ICD-10-CM | POA: Diagnosis present

## 2021-02-19 DIAGNOSIS — Z6828 Body mass index (BMI) 28.0-28.9, adult: Secondary | ICD-10-CM | POA: Diagnosis not present

## 2021-02-19 DIAGNOSIS — Z9012 Acquired absence of left breast and nipple: Secondary | ICD-10-CM | POA: Diagnosis not present

## 2021-02-19 DIAGNOSIS — Z8249 Family history of ischemic heart disease and other diseases of the circulatory system: Secondary | ICD-10-CM | POA: Insufficient documentation

## 2021-02-19 LAB — CBC WITH DIFFERENTIAL/PLATELET
Abs Immature Granulocytes: 0.02 10*3/uL (ref 0.00–0.07)
Basophils Absolute: 0 10*3/uL (ref 0.0–0.1)
Basophils Relative: 0 %
Eosinophils Absolute: 0 10*3/uL (ref 0.0–0.5)
Eosinophils Relative: 1 %
HCT: 20.4 % — ABNORMAL LOW (ref 36.0–46.0)
Hemoglobin: 6.7 g/dL — CL (ref 12.0–15.0)
Immature Granulocytes: 2 %
Lymphocytes Relative: 37 %
Lymphs Abs: 0.5 10*3/uL — ABNORMAL LOW (ref 0.7–4.0)
MCH: 29.4 pg (ref 26.0–34.0)
MCHC: 32.8 g/dL (ref 30.0–36.0)
MCV: 89.5 fL (ref 80.0–100.0)
Monocytes Absolute: 0.1 10*3/uL (ref 0.1–1.0)
Monocytes Relative: 7 %
Neutro Abs: 0.7 10*3/uL — ABNORMAL LOW (ref 1.7–7.7)
Neutrophils Relative %: 53 %
Platelets: 73 10*3/uL — ABNORMAL LOW (ref 150–400)
RBC: 2.28 MIL/uL — ABNORMAL LOW (ref 3.87–5.11)
RDW: 14.6 % (ref 11.5–15.5)
WBC: 1.3 10*3/uL — ABNORMAL LOW (ref 4.0–10.5)
nRBC: 0 % (ref 0.0–0.2)

## 2021-02-19 LAB — COMPREHENSIVE METABOLIC PANEL
ALT: <6 U/L (ref 0–44)
AST: 17 U/L (ref 15–41)
Albumin: 3.4 g/dL — ABNORMAL LOW (ref 3.5–5.0)
Alkaline Phosphatase: 107 U/L (ref 38–126)
Anion gap: 11 (ref 5–15)
BUN: 43 mg/dL — ABNORMAL HIGH (ref 6–20)
CO2: 24 mmol/L (ref 22–32)
Calcium: 7.6 mg/dL — ABNORMAL LOW (ref 8.9–10.3)
Chloride: 106 mmol/L (ref 98–111)
Creatinine, Ser: 1.9 mg/dL — ABNORMAL HIGH (ref 0.44–1.00)
GFR, Estimated: 31 mL/min — ABNORMAL LOW (ref >60)
Glucose, Bld: 89 mg/dL (ref 70–99)
Potassium: 3.6 mmol/L (ref 3.5–5.1)
Sodium: 141 mmol/L (ref 135–145)
Total Bilirubin: 0.4 mg/dL (ref 0.3–1.2)
Total Protein: 6.6 g/dL (ref 6.5–8.1)

## 2021-02-19 LAB — IRON AND TIBC
Iron: 209 ug/dL — ABNORMAL HIGH (ref 28–170)
Saturation Ratios: 88 % — ABNORMAL HIGH (ref 10.4–31.8)
TIBC: 239 ug/dL — ABNORMAL LOW (ref 250–450)
UIBC: 30 ug/dL

## 2021-02-19 LAB — RETICULOCYTES
Immature Retic Fract: 12.4 % (ref 2.3–15.9)
RBC.: 2.27 MIL/uL — ABNORMAL LOW (ref 3.87–5.11)
Retic Count, Absolute: 20 10*3/uL (ref 19.0–186.0)
Retic Ct Pct: 0.9 % (ref 0.4–3.1)

## 2021-02-19 LAB — FERRITIN: Ferritin: 3499 ng/mL — ABNORMAL HIGH (ref 11–307)

## 2021-02-19 LAB — SAMPLE TO BLOOD BANK

## 2021-02-19 MED ORDER — DEFERIPRONE 1000 MG PO TABS: 1000.0000 mg | ORAL_TABLET | Freq: Two times a day (BID) | ORAL | 1 refills | Status: DC

## 2021-02-19 MED ORDER — DARBEPOETIN ALFA 500 MCG/ML IJ SOSY
PREFILLED_SYRINGE | INTRAMUSCULAR | Status: AC
  Filled 2021-02-19: qty 1

## 2021-02-19 MED ORDER — DARBEPOETIN ALFA 500 MCG/ML IJ SOSY
500.0000 ug | PREFILLED_SYRINGE | Freq: Once | INTRAMUSCULAR | Status: AC
  Administered 2021-02-19: 500 ug via SUBCUTANEOUS

## 2021-02-19 MED ORDER — ONDANSETRON HCL 8 MG PO TABS: 8.0000 mg | ORAL_TABLET | Freq: Two times a day (BID) | ORAL | 0 refills | Status: AC | PRN

## 2021-02-19 NOTE — Patient Instructions (Signed)
Darbepoetin Alfa injection What is this medicine? DARBEPOETIN ALFA (dar be POE e tin AL fa) helps your body make more red blood cells. It is used to treat anemia caused by chronic kidney failure and chemotherapy. This medicine may be used for other purposes; ask your health care provider or pharmacist if you have questions. COMMON BRAND NAME(S): Aranesp What should I tell my health care provider before I take this medicine? They need to know if you have any of these conditions:  blood clotting disorders or history of blood clots  cancer patient not on chemotherapy  cystic fibrosis  heart disease, such as angina, heart failure, or a history of a heart attack  hemoglobin level of 12 g/dL or greater  high blood pressure  low levels of folate, iron, or vitamin B12  seizures  an unusual or allergic reaction to darbepoetin, erythropoietin, albumin, hamster proteins, latex, other medicines, foods, dyes, or preservatives  pregnant or trying to get pregnant  breast-feeding How should I use this medicine? This medicine is for injection into a vein or under the skin. It is usually given by a health care professional in a hospital or clinic setting. If you get this medicine at home, you will be taught how to prepare and give this medicine. Use exactly as directed. Take your medicine at regular intervals. Do not take your medicine more often than directed. It is important that you put your used needles and syringes in a special sharps container. Do not put them in a trash can. If you do not have a sharps container, call your pharmacist or healthcare provider to get one. A special MedGuide will be given to you by the pharmacist with each prescription and refill. Be sure to read this information carefully each time. Talk to your pediatrician regarding the use of this medicine in children. While this medicine may be used in children as young as 1 month of age for selected conditions, precautions do  apply. Overdosage: If you think you have taken too much of this medicine contact a poison control center or emergency room at once. NOTE: This medicine is only for you. Do not share this medicine with others. What if I miss a dose? If you miss a dose, take it as soon as you can. If it is almost time for your next dose, take only that dose. Do not take double or extra doses. What may interact with this medicine? Do not take this medicine with any of the following medications:  epoetin alfa This list may not describe all possible interactions. Give your health care provider a list of all the medicines, herbs, non-prescription drugs, or dietary supplements you use. Also tell them if you smoke, drink alcohol, or use illegal drugs. Some items may interact with your medicine. What should I watch for while using this medicine? Your condition will be monitored carefully while you are receiving this medicine. You may need blood work done while you are taking this medicine. This medicine may cause a decrease in vitamin B6. You should make sure that you get enough vitamin B6 while you are taking this medicine. Discuss the foods you eat and the vitamins you take with your health care professional. What side effects may I notice from receiving this medicine? Side effects that you should report to your doctor or health care professional as soon as possible:  allergic reactions like skin rash, itching or hives, swelling of the face, lips, or tongue  breathing problems  changes in   vision  chest pain  confusion, trouble speaking or understanding  feeling faint or lightheaded, falls  high blood pressure  muscle aches or pains  pain, swelling, warmth in the leg  rapid weight gain  severe headaches  sudden numbness or weakness of the face, arm or leg  trouble walking, dizziness, loss of balance or coordination  seizures (convulsions)  swelling of the ankles, feet, hands  unusually weak or  tired Side effects that usually do not require medical attention (report to your doctor or health care professional if they continue or are bothersome):  diarrhea  fever, chills (flu-like symptoms)  headaches  nausea, vomiting  redness, stinging, or swelling at site where injected This list may not describe all possible side effects. Call your doctor for medical advice about side effects. You may report side effects to FDA at 1-800-FDA-1088. Where should I keep my medicine? Keep out of the reach of children. Store in a refrigerator between 2 and 8 degrees C (36 and 46 degrees F). Do not freeze. Do not shake. Throw away any unused portion if using a single-dose vial. Throw away any unused medicine after the expiration date. NOTE: This sheet is a summary. It may not cover all possible information. If you have questions about this medicine, talk to your doctor, pharmacist, or health care provider.  2021 Elsevier/Gold Standard (2017-10-20 16:44:20)  

## 2021-02-19 NOTE — Progress Notes (Signed)
Pt scheduled for 2U PRBC to receive 02/21/21. Pt is aware
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
## 2021-02-20 ENCOUNTER — Other Ambulatory Visit: Payer: Self-pay | Admitting: *Deleted

## 2021-02-20 ENCOUNTER — Telehealth: Payer: Self-pay | Admitting: *Deleted

## 2021-02-20 DIAGNOSIS — D631 Anemia in chronic kidney disease: Secondary | ICD-10-CM

## 2021-02-20 DIAGNOSIS — C50912 Malignant neoplasm of unspecified site of left female breast: Secondary | ICD-10-CM

## 2021-02-20 LAB — CANCER ANTIGEN 27.29: CA 27.29: 414.2 U/mL — ABNORMAL HIGH (ref 0.0–38.6)

## 2021-02-20 LAB — PREPARE RBC (CROSSMATCH)

## 2021-02-20 NOTE — Telephone Encounter (Signed)
[facility] FQ#[id number] faxed response regarding Deferiprone CoverMyMeds prior authorization Key: [id number].   "Further chart notes showing patient pretreatment serum ferritin level is consistently greater than 1000 mcg/L" requested for review.    Awaiting response of review of Ferritin lab results faxed to [facility] [phone]).   Ferritin flow-sheet displays results 09/08/2017 through 02/19/2021 fluctuating between lowest high on 03/12/2020 = 1978 ng/mL or 1,978,000 ng/L to greatest high result on 10/02/2020 = 6831 ng/mL or 6,831,000 ng/L.    02/19/2021 Ferritin = 3499 ng/mL or 3,499,000 ng/L.

## 2021-02-20 NOTE — Telephone Encounter (Signed)
[facility] Deferiprone [place] denial letter in provider folder for pick-up.  Appeal instructions noted.

## 2021-02-21 ENCOUNTER — Inpatient Hospital Stay: Payer: No Typology Code available for payment source

## 2021-02-21 ENCOUNTER — Other Ambulatory Visit: Payer: Self-pay

## 2021-02-21 DIAGNOSIS — C50912 Malignant neoplasm of unspecified site of left female breast: Secondary | ICD-10-CM

## 2021-02-21 DIAGNOSIS — Z170 Estrogen receptor positive status [ER+]: Secondary | ICD-10-CM

## 2021-02-21 DIAGNOSIS — I130 Hypertensive heart and chronic kidney disease with heart failure and stage 1 through stage 4 chronic kidney disease, or unspecified chronic kidney disease: Secondary | ICD-10-CM | POA: Diagnosis not present

## 2021-02-21 MED ORDER — ACETAMINOPHEN 325 MG PO TABS
650.0000 mg | ORAL_TABLET | Freq: Once | ORAL | Status: AC
  Administered 2021-02-21: 650 mg via ORAL

## 2021-02-21 MED ORDER — DIPHENHYDRAMINE HCL 25 MG PO CAPS
ORAL_CAPSULE | ORAL | Status: AC
  Filled 2021-02-21: qty 1

## 2021-02-21 MED ORDER — DIPHENHYDRAMINE HCL 25 MG PO CAPS
25.0000 mg | ORAL_CAPSULE | Freq: Once | ORAL | Status: AC
Start: 2021-02-21 — End: 2021-02-21
  Administered 2021-02-21: 25 mg via ORAL

## 2021-02-21 MED ORDER — ACETAMINOPHEN 325 MG PO TABS
ORAL_TABLET | ORAL | Status: AC
  Filled 2021-02-21: qty 2

## 2021-02-21 MED ORDER — SODIUM CHLORIDE 0.9% IV SOLUTION
250.0000 mL | Freq: Once | INTRAVENOUS | Status: AC
  Administered 2021-02-21: 250 mL via INTRAVENOUS
  Filled 2021-02-21: qty 250

## 2021-02-21 NOTE — Patient Instructions (Signed)

## 2021-02-24 ENCOUNTER — Other Ambulatory Visit (HOSPITAL_COMMUNITY): Payer: Self-pay

## 2021-02-24 LAB — BPAM RBC
Blood Product Expiration Date: 202206022359
Blood Product Expiration Date: 202206022359
ISSUE DATE / TIME: 202205060951
ISSUE DATE / TIME: 202205060951
Unit Type and Rh: 7300
Unit Type and Rh: 7300

## 2021-02-24 LAB — TYPE AND SCREEN
ABO/RH(D): B POS
Antibody Screen: POSITIVE
DAT, IgG: NEGATIVE
Unit division: 0
Unit division: 0

## 2021-02-25 ENCOUNTER — Telehealth: Payer: Self-pay | Admitting: Oncology

## 2021-02-25 NOTE — Telephone Encounter (Signed)
Scheduled per 5/4 los. Called and spoke with pt confirmed 5/24 appts
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
## 2021-03-11 ENCOUNTER — Inpatient Hospital Stay (HOSPITAL_BASED_OUTPATIENT_CLINIC_OR_DEPARTMENT_OTHER): Payer: No Typology Code available for payment source | Admitting: Oncology

## 2021-03-11 ENCOUNTER — Other Ambulatory Visit: Payer: Self-pay

## 2021-03-11 ENCOUNTER — Telehealth: Payer: Self-pay | Admitting: Oncology

## 2021-03-11 ENCOUNTER — Encounter: Payer: Self-pay | Admitting: Adult Health

## 2021-03-11 ENCOUNTER — Inpatient Hospital Stay: Payer: No Typology Code available for payment source

## 2021-03-11 ENCOUNTER — Other Ambulatory Visit (HOSPITAL_COMMUNITY): Payer: Self-pay

## 2021-03-11 VITALS — BP 100/58 | HR 88 | Temp 97.5°F | Resp 18 | Ht 59.0 in

## 2021-03-11 DIAGNOSIS — D631 Anemia in chronic kidney disease: Secondary | ICD-10-CM

## 2021-03-11 DIAGNOSIS — C7951 Secondary malignant neoplasm of bone: Secondary | ICD-10-CM

## 2021-03-11 DIAGNOSIS — M329 Systemic lupus erythematosus, unspecified: Secondary | ICD-10-CM

## 2021-03-11 DIAGNOSIS — C50812 Malignant neoplasm of overlapping sites of left female breast: Secondary | ICD-10-CM

## 2021-03-11 DIAGNOSIS — IMO0002 Reserved for concepts with insufficient information to code with codable children: Secondary | ICD-10-CM

## 2021-03-11 DIAGNOSIS — I130 Hypertensive heart and chronic kidney disease with heart failure and stage 1 through stage 4 chronic kidney disease, or unspecified chronic kidney disease: Secondary | ICD-10-CM | POA: Diagnosis not present

## 2021-03-11 DIAGNOSIS — N189 Chronic kidney disease, unspecified: Secondary | ICD-10-CM

## 2021-03-11 DIAGNOSIS — N179 Acute kidney failure, unspecified: Secondary | ICD-10-CM

## 2021-03-11 DIAGNOSIS — Z7189 Other specified counseling: Secondary | ICD-10-CM

## 2021-03-11 DIAGNOSIS — M3214 Glomerular disease in systemic lupus erythematosus: Secondary | ICD-10-CM

## 2021-03-11 DIAGNOSIS — N184 Chronic kidney disease, stage 4 (severe): Secondary | ICD-10-CM

## 2021-03-11 DIAGNOSIS — Z170 Estrogen receptor positive status [ER+]: Secondary | ICD-10-CM

## 2021-03-11 DIAGNOSIS — D61818 Other pancytopenia: Secondary | ICD-10-CM

## 2021-03-11 DIAGNOSIS — C771 Secondary and unspecified malignant neoplasm of intrathoracic lymph nodes: Secondary | ICD-10-CM

## 2021-03-11 DIAGNOSIS — C50512 Malignant neoplasm of lower-outer quadrant of left female breast: Secondary | ICD-10-CM

## 2021-03-11 DIAGNOSIS — C50912 Malignant neoplasm of unspecified site of left female breast: Secondary | ICD-10-CM

## 2021-03-11 LAB — CBC WITH DIFFERENTIAL/PLATELET
Abs Immature Granulocytes: 0.03 10*3/uL (ref 0.00–0.07)
Basophils Absolute: 0 10*3/uL (ref 0.0–0.1)
Basophils Relative: 0 %
Eosinophils Absolute: 0 10*3/uL (ref 0.0–0.5)
Eosinophils Relative: 1 %
HCT: 27.5 % — ABNORMAL LOW (ref 36.0–46.0)
Hemoglobin: 9.2 g/dL — ABNORMAL LOW (ref 12.0–15.0)
Immature Granulocytes: 2 %
Lymphocytes Relative: 35 %
Lymphs Abs: 0.5 10*3/uL — ABNORMAL LOW (ref 0.7–4.0)
MCH: 27.9 pg (ref 26.0–34.0)
MCHC: 33.5 g/dL (ref 30.0–36.0)
MCV: 83.3 fL (ref 80.0–100.0)
Monocytes Absolute: 0.1 10*3/uL (ref 0.1–1.0)
Monocytes Relative: 4 %
Neutro Abs: 0.8 10*3/uL — ABNORMAL LOW (ref 1.7–7.7)
Neutrophils Relative %: 58 %
Platelets: 63 10*3/uL — ABNORMAL LOW (ref 150–400)
RBC: 3.3 MIL/uL — ABNORMAL LOW (ref 3.87–5.11)
RDW: 16 % — ABNORMAL HIGH (ref 11.5–15.5)
WBC: 1.4 10*3/uL — ABNORMAL LOW (ref 4.0–10.5)
nRBC: 1.4 % — ABNORMAL HIGH (ref 0.0–0.2)

## 2021-03-11 LAB — VITAMIN B12: Vitamin B-12: 379 pg/mL (ref 180–914)

## 2021-03-11 LAB — COMPREHENSIVE METABOLIC PANEL
ALT: <6 U/L (ref 0–44)
AST: 17 U/L (ref 15–41)
Albumin: 3.6 g/dL (ref 3.5–5.0)
Alkaline Phosphatase: 124 U/L (ref 38–126)
Anion gap: 15 (ref 5–15)
BUN: 64 mg/dL — ABNORMAL HIGH (ref 6–20)
CO2: 18 mmol/L — ABNORMAL LOW (ref 22–32)
Calcium: 8 mg/dL — ABNORMAL LOW (ref 8.9–10.3)
Chloride: 105 mmol/L (ref 98–111)
Creatinine, Ser: 1.96 mg/dL — ABNORMAL HIGH (ref 0.44–1.00)
GFR, Estimated: 29 mL/min — ABNORMAL LOW (ref >60)
Glucose, Bld: 89 mg/dL (ref 70–99)
Potassium: 3.9 mmol/L (ref 3.5–5.1)
Sodium: 138 mmol/L (ref 135–145)
Total Bilirubin: 0.6 mg/dL (ref 0.3–1.2)
Total Protein: 7.3 g/dL (ref 6.5–8.1)

## 2021-03-11 LAB — FOLATE: Folate: 4.7 ng/mL — ABNORMAL LOW (ref >5.9)

## 2021-03-11 MED ORDER — DARBEPOETIN ALFA 500 MCG/ML IJ SOSY
500.0000 ug | PREFILLED_SYRINGE | Freq: Once | INTRAMUSCULAR | Status: AC
  Administered 2021-03-11: 500 ug via SUBCUTANEOUS

## 2021-03-11 MED ORDER — DARBEPOETIN ALFA 500 MCG/ML IJ SOSY
PREFILLED_SYRINGE | INTRAMUSCULAR | Status: AC
  Filled 2021-03-11: qty 1

## 2021-03-11 MED ORDER — DEFERIPRONE 1000 MG PO TABS
1000.0000 mg | ORAL_TABLET | Freq: Two times a day (BID) | ORAL | 1 refills | Status: DC
  Filled 2021-03-11 – 2021-04-08 (×2): qty 180, 90d supply, fill #0

## 2021-03-11 NOTE — Telephone Encounter (Signed)
Scheduled per 5/24 sch msg. Called and spoke with pt, confirmed 6/14 appt at [facility]

## 2021-03-11 NOTE — Patient Instructions (Signed)
Darbepoetin Alfa injection What is this medicine? DARBEPOETIN ALFA (dar be POE e tin AL fa) helps your body make more red blood cells. It is used to treat anemia caused by chronic kidney failure and chemotherapy. This medicine may be used for other purposes; ask your health care provider or pharmacist if you have questions. COMMON BRAND NAME(S): Aranesp What should I tell my health care provider before I take this medicine? They need to know if you have any of these conditions:  blood clotting disorders or history of blood clots  cancer patient not on chemotherapy  cystic fibrosis  heart disease, such as angina, heart failure, or a history of a heart attack  hemoglobin level of 12 g/dL or greater  high blood pressure  low levels of folate, iron, or vitamin B12  seizures  an unusual or allergic reaction to darbepoetin, erythropoietin, albumin, hamster proteins, latex, other medicines, foods, dyes, or preservatives  pregnant or trying to get pregnant  breast-feeding How should I use this medicine? This medicine is for injection into a vein or under the skin. It is usually given by a health care professional in a hospital or clinic setting. If you get this medicine at home, you will be taught how to prepare and give this medicine. Use exactly as directed. Take your medicine at regular intervals. Do not take your medicine more often than directed. It is important that you put your used needles and syringes in a special sharps container. Do not put them in a trash can. If you do not have a sharps container, call your pharmacist or healthcare provider to get one. A special MedGuide will be given to you by the pharmacist with each prescription and refill. Be sure to read this information carefully each time. Talk to your pediatrician regarding the use of this medicine in children. While this medicine may be used in children as young as 1 month of age for selected conditions, precautions do  apply. Overdosage: If you think you have taken too much of this medicine contact a poison control center or emergency room at once. NOTE: This medicine is only for you. Do not share this medicine with others. What if I miss a dose? If you miss a dose, take it as soon as you can. If it is almost time for your next dose, take only that dose. Do not take double or extra doses. What may interact with this medicine? Do not take this medicine with any of the following medications:  epoetin alfa This list may not describe all possible interactions. Give your health care provider a list of all the medicines, herbs, non-prescription drugs, or dietary supplements you use. Also tell them if you smoke, drink alcohol, or use illegal drugs. Some items may interact with your medicine. What should I watch for while using this medicine? Your condition will be monitored carefully while you are receiving this medicine. You may need blood work done while you are taking this medicine. This medicine may cause a decrease in vitamin B6. You should make sure that you get enough vitamin B6 while you are taking this medicine. Discuss the foods you eat and the vitamins you take with your health care professional. What side effects may I notice from receiving this medicine? Side effects that you should report to your doctor or health care professional as soon as possible:  allergic reactions like skin rash, itching or hives, swelling of the face, lips, or tongue  breathing problems  changes in   vision  chest pain  confusion, trouble speaking or understanding  feeling faint or lightheaded, falls  high blood pressure  muscle aches or pains  pain, swelling, warmth in the leg  rapid weight gain  severe headaches  sudden numbness or weakness of the face, arm or leg  trouble walking, dizziness, loss of balance or coordination  seizures (convulsions)  swelling of the ankles, feet, hands  unusually weak or  tired Side effects that usually do not require medical attention (report to your doctor or health care professional if they continue or are bothersome):  diarrhea  fever, chills (flu-like symptoms)  headaches  nausea, vomiting  redness, stinging, or swelling at site where injected This list may not describe all possible side effects. Call your doctor for medical advice about side effects. You may report side effects to FDA at 1-800-FDA-1088. Where should I keep my medicine? Keep out of the reach of children. Store in a refrigerator between 2 and 8 degrees C (36 and 46 degrees F). Do not freeze. Do not shake. Throw away any unused portion if using a single-dose vial. Throw away any unused medicine after the expiration date. NOTE: This sheet is a summary. It may not cover all possible information. If you have questions about this medicine, talk to your doctor, pharmacist, or health care provider.  2021 Elsevier/Gold Standard (2017-10-20 16:44:20)  

## 2021-03-11 NOTE — Progress Notes (Signed)
Per Dr. Jana Hakim, no blood transfusion today just Aranesp injection.

## 2021-03-12 ENCOUNTER — Encounter: Payer: Self-pay | Admitting: Adult Health

## 2021-03-12 ENCOUNTER — Other Ambulatory Visit (HOSPITAL_COMMUNITY): Payer: Self-pay

## 2021-03-18 ENCOUNTER — Ambulatory Visit (HOSPITAL_COMMUNITY): Admission: RE | Admit: 2021-03-18 | Payer: No Typology Code available for payment source | Source: Ambulatory Visit

## 2021-03-18 ENCOUNTER — Encounter (HOSPITAL_COMMUNITY): Payer: No Typology Code available for payment source

## 2021-03-18 ENCOUNTER — Other Ambulatory Visit (HOSPITAL_COMMUNITY): Payer: Self-pay

## 2021-03-19 ENCOUNTER — Ambulatory Visit: Payer: No Typology Code available for payment source

## 2021-03-19 ENCOUNTER — Other Ambulatory Visit: Payer: No Typology Code available for payment source

## 2021-03-25 ENCOUNTER — Other Ambulatory Visit: Payer: Self-pay

## 2021-03-25 ENCOUNTER — Encounter
Admission: RE | Admit: 2021-03-25 | Discharge: 2021-03-25 | Disposition: A | Discharge status: Home / Self Care | Payer: No Typology Code available for payment source | Source: Ambulatory Visit | Attending: Oncology | Admitting: Oncology

## 2021-03-25 ENCOUNTER — Ambulatory Visit
Admission: RE | Admit: 2021-03-25 | Discharge: 2021-03-25 | Disposition: A | Discharge status: Home / Self Care | Payer: No Typology Code available for payment source | Source: Ambulatory Visit | Attending: Oncology | Admitting: Oncology

## 2021-03-25 DIAGNOSIS — N184 Chronic kidney disease, stage 4 (severe): Secondary | ICD-10-CM | POA: Diagnosis present

## 2021-03-25 DIAGNOSIS — C50812 Malignant neoplasm of overlapping sites of left female breast: Secondary | ICD-10-CM

## 2021-03-25 DIAGNOSIS — M3214 Glomerular disease in systemic lupus erythematosus: Secondary | ICD-10-CM

## 2021-03-25 DIAGNOSIS — C771 Secondary and unspecified malignant neoplasm of intrathoracic lymph nodes: Secondary | ICD-10-CM | POA: Diagnosis present

## 2021-03-25 DIAGNOSIS — M329 Systemic lupus erythematosus, unspecified: Secondary | ICD-10-CM | POA: Insufficient documentation

## 2021-03-25 DIAGNOSIS — IMO0002 Reserved for concepts with insufficient information to code with codable children: Secondary | ICD-10-CM

## 2021-03-25 DIAGNOSIS — D631 Anemia in chronic kidney disease: Secondary | ICD-10-CM

## 2021-03-25 DIAGNOSIS — D61818 Other pancytopenia: Secondary | ICD-10-CM

## 2021-03-25 DIAGNOSIS — Z7189 Other specified counseling: Secondary | ICD-10-CM

## 2021-03-25 DIAGNOSIS — C7951 Secondary malignant neoplasm of bone: Secondary | ICD-10-CM | POA: Diagnosis present

## 2021-03-25 DIAGNOSIS — C50912 Malignant neoplasm of unspecified site of left female breast: Secondary | ICD-10-CM | POA: Diagnosis present

## 2021-03-25 DIAGNOSIS — Z170 Estrogen receptor positive status [ER+]: Secondary | ICD-10-CM | POA: Insufficient documentation

## 2021-03-25 MED ORDER — TECHNETIUM TC 99M MEDRONATE IV KIT
20.4700 | PACK | Freq: Once | INTRAVENOUS | Status: AC | PRN
  Administered 2021-03-25: 20.47 via INTRAVENOUS

## 2021-04-01 ENCOUNTER — Other Ambulatory Visit: Payer: Self-pay

## 2021-04-01 ENCOUNTER — Other Ambulatory Visit: Payer: Self-pay | Admitting: *Deleted

## 2021-04-01 ENCOUNTER — Inpatient Hospital Stay: Payer: No Typology Code available for payment source

## 2021-04-01 ENCOUNTER — Inpatient Hospital Stay: Payer: No Typology Code available for payment source | Attending: Oncology

## 2021-04-01 ENCOUNTER — Telehealth: Payer: Self-pay | Admitting: *Deleted

## 2021-04-01 VITALS — BP 90/65 | HR 98 | Temp 98.3°F | Resp 18

## 2021-04-01 DIAGNOSIS — C7951 Secondary malignant neoplasm of bone: Secondary | ICD-10-CM | POA: Insufficient documentation

## 2021-04-01 DIAGNOSIS — Z79810 Long term (current) use of selective estrogen receptor modulators (SERMs): Secondary | ICD-10-CM | POA: Insufficient documentation

## 2021-04-01 DIAGNOSIS — D631 Anemia in chronic kidney disease: Secondary | ICD-10-CM | POA: Diagnosis present

## 2021-04-01 DIAGNOSIS — M3214 Glomerular disease in systemic lupus erythematosus: Secondary | ICD-10-CM

## 2021-04-01 DIAGNOSIS — Z803 Family history of malignant neoplasm of breast: Secondary | ICD-10-CM | POA: Insufficient documentation

## 2021-04-01 DIAGNOSIS — C50512 Malignant neoplasm of lower-outer quadrant of left female breast: Secondary | ICD-10-CM | POA: Insufficient documentation

## 2021-04-01 DIAGNOSIS — I130 Hypertensive heart and chronic kidney disease with heart failure and stage 1 through stage 4 chronic kidney disease, or unspecified chronic kidney disease: Secondary | ICD-10-CM | POA: Diagnosis not present

## 2021-04-01 DIAGNOSIS — Z923 Personal history of irradiation: Secondary | ICD-10-CM | POA: Insufficient documentation

## 2021-04-01 DIAGNOSIS — Z9221 Personal history of antineoplastic chemotherapy: Secondary | ICD-10-CM | POA: Diagnosis not present

## 2021-04-01 DIAGNOSIS — Z8249 Family history of ischemic heart disease and other diseases of the circulatory system: Secondary | ICD-10-CM | POA: Diagnosis not present

## 2021-04-01 DIAGNOSIS — I509 Heart failure, unspecified: Secondary | ICD-10-CM | POA: Insufficient documentation

## 2021-04-01 DIAGNOSIS — C771 Secondary and unspecified malignant neoplasm of intrathoracic lymph nodes: Secondary | ICD-10-CM

## 2021-04-01 DIAGNOSIS — Z9012 Acquired absence of left breast and nipple: Secondary | ICD-10-CM | POA: Diagnosis not present

## 2021-04-01 DIAGNOSIS — N179 Acute kidney failure, unspecified: Secondary | ICD-10-CM

## 2021-04-01 DIAGNOSIS — Z79899 Other long term (current) drug therapy: Secondary | ICD-10-CM | POA: Insufficient documentation

## 2021-04-01 DIAGNOSIS — R197 Diarrhea, unspecified: Secondary | ICD-10-CM | POA: Insufficient documentation

## 2021-04-01 DIAGNOSIS — D561 Beta thalassemia: Secondary | ICD-10-CM

## 2021-04-01 DIAGNOSIS — Z170 Estrogen receptor positive status [ER+]: Secondary | ICD-10-CM | POA: Insufficient documentation

## 2021-04-01 DIAGNOSIS — N189 Chronic kidney disease, unspecified: Secondary | ICD-10-CM

## 2021-04-01 DIAGNOSIS — N184 Chronic kidney disease, stage 4 (severe): Secondary | ICD-10-CM | POA: Insufficient documentation

## 2021-04-01 DIAGNOSIS — Z833 Family history of diabetes mellitus: Secondary | ICD-10-CM | POA: Insufficient documentation

## 2021-04-01 DIAGNOSIS — D61818 Other pancytopenia: Secondary | ICD-10-CM

## 2021-04-01 DIAGNOSIS — I700 Atherosclerosis of aorta: Secondary | ICD-10-CM

## 2021-04-01 DIAGNOSIS — Z6828 Body mass index (BMI) 28.0-28.9, adult: Secondary | ICD-10-CM | POA: Insufficient documentation

## 2021-04-01 DIAGNOSIS — M329 Systemic lupus erythematosus, unspecified: Secondary | ICD-10-CM

## 2021-04-01 LAB — CBC WITH DIFFERENTIAL/PLATELET
Abs Immature Granulocytes: 0.02 10*3/uL (ref 0.00–0.07)
Basophils Absolute: 0 10*3/uL (ref 0.0–0.1)
Basophils Relative: 0 %
Eosinophils Absolute: 0 10*3/uL (ref 0.0–0.5)
Eosinophils Relative: 1 %
HCT: 17.8 % — ABNORMAL LOW (ref 36.0–46.0)
Hemoglobin: 6.1 g/dL — CL (ref 12.0–15.0)
Immature Granulocytes: 2 %
Lymphocytes Relative: 36 %
Lymphs Abs: 0.5 10*3/uL — ABNORMAL LOW (ref 0.7–4.0)
MCH: 28.1 pg (ref 26.0–34.0)
MCHC: 34.3 g/dL (ref 30.0–36.0)
MCV: 82 fL (ref 80.0–100.0)
Monocytes Absolute: 0.1 10*3/uL (ref 0.1–1.0)
Monocytes Relative: 6 %
Neutro Abs: 0.8 10*3/uL — ABNORMAL LOW (ref 1.7–7.7)
Neutrophils Relative %: 55 %
Platelets: 51 10*3/uL — ABNORMAL LOW (ref 150–400)
RBC: 2.17 MIL/uL — ABNORMAL LOW (ref 3.87–5.11)
RDW: 16.2 % — ABNORMAL HIGH (ref 11.5–15.5)
WBC: 1.4 10*3/uL — ABNORMAL LOW (ref 4.0–10.5)
nRBC: 0 % (ref 0.0–0.2)

## 2021-04-01 LAB — COMPREHENSIVE METABOLIC PANEL
ALT: <6 U/L (ref 0–44)
AST: 15 U/L (ref 15–41)
Albumin: 3.5 g/dL (ref 3.5–5.0)
Alkaline Phosphatase: 115 U/L (ref 38–126)
Anion gap: 15 (ref 5–15)
BUN: 54 mg/dL — ABNORMAL HIGH (ref 6–20)
CO2: 16 mmol/L — ABNORMAL LOW (ref 22–32)
Calcium: 8.5 mg/dL — ABNORMAL LOW (ref 8.9–10.3)
Chloride: 107 mmol/L (ref 98–111)
Creatinine, Ser: 1.89 mg/dL — ABNORMAL HIGH (ref 0.44–1.00)
GFR, Estimated: 31 mL/min — ABNORMAL LOW (ref >60)
Glucose, Bld: 94 mg/dL (ref 70–99)
Potassium: 3.5 mmol/L (ref 3.5–5.1)
Sodium: 138 mmol/L (ref 135–145)
Total Bilirubin: 0.5 mg/dL (ref 0.3–1.2)
Total Protein: 7 g/dL (ref 6.5–8.1)

## 2021-04-01 LAB — IRON AND TIBC
Iron: 216 ug/dL — ABNORMAL HIGH (ref 41–142)
Saturation Ratios: 101 % — ABNORMAL HIGH (ref 21–57)
TIBC: 214 ug/dL — ABNORMAL LOW (ref 236–444)
UIBC: UNDETERMINED ug/dL (ref 120–384)

## 2021-04-01 LAB — RETICULOCYTES
Immature Retic Fract: 9.1 % (ref 2.3–15.9)
RBC.: 2.13 MIL/uL — ABNORMAL LOW (ref 3.87–5.11)
Retic Count, Absolute: 11.9 10*3/uL — ABNORMAL LOW (ref 19.0–186.0)
Retic Ct Pct: 0.6 % (ref 0.4–3.1)

## 2021-04-01 LAB — PREPARE RBC (CROSSMATCH)

## 2021-04-01 LAB — FERRITIN: Ferritin: 5484 ng/mL — ABNORMAL HIGH (ref 11–307)

## 2021-04-01 MED ORDER — DARBEPOETIN ALFA 500 MCG/ML IJ SOSY
500.0000 ug | PREFILLED_SYRINGE | Freq: Once | INTRAMUSCULAR | Status: AC
  Administered 2021-04-01: 500 ug via SUBCUTANEOUS

## 2021-04-01 MED ORDER — DARBEPOETIN ALFA 500 MCG/ML IJ SOSY
PREFILLED_SYRINGE | INTRAMUSCULAR | Status: AC
  Filled 2021-04-01: qty 1

## 2021-04-01 NOTE — Telephone Encounter (Signed)
Per heme today of 6.1 with noted symptoms - blood transfusion arranged for 04/03/2021.  Orders entered.

## 2021-04-01 NOTE — Progress Notes (Signed)
Pt's hemoglobin is 6.1, Val dodd RN is aware and said it was okay to give Aranesp inj today and pt agreed to inj as well.

## 2021-04-01 NOTE — Patient Instructions (Signed)
Darbepoetin Alfa injection What is this medication? DARBEPOETIN ALFA (dar be POE e tin AL fa) helps your body make more red blood cells. It is used to treat anemia caused by chronic kidney failure and chemotherapy. This medicine may be used for other purposes; ask your health care provider or pharmacist if you have questions. COMMON BRAND NAME(S): Aranesp What should I tell my care team before I take this medication? They need to know if you have any of these conditions: blood clotting disorders or history of blood clots cancer patient not on chemotherapy cystic fibrosis heart disease, such as angina, heart failure, or a history of a heart attack hemoglobin level of 12 g/dL or greater high blood pressure low levels of folate, iron, or vitamin B12 seizures an unusual or allergic reaction to darbepoetin, erythropoietin, albumin, hamster proteins, latex, other medicines, foods, dyes, or preservatives pregnant or trying to get pregnant breast-feeding How should I use this medication? This medicine is for injection into a vein or under the skin. It is usually given by a health care professional in a hospital or clinic setting. If you get this medicine at home, you will be taught how to prepare and give this medicine. Use exactly as directed. Take your medicine at regular intervals. Do not take your medicine more often than directed. It is important that you put your used needles and syringes in a special sharps container. Do not put them in a trash can. If you do not have a sharps container, call your pharmacist or healthcare provider to get one. A special MedGuide will be given to you by the pharmacist with each prescription and refill. Be sure to read this information carefully each time. Talk to your pediatrician regarding the use of this medicine in children. While this medicine may be used in children as young as 1 month of age for selected conditions, precautions do apply. Overdosage: If you  think you have taken too much of this medicine contact a poison control center or emergency room at once. NOTE: This medicine is only for you. Do not share this medicine with others. What if I miss a dose? If you miss a dose, take it as soon as you can. If it is almost time for your next dose, take only that dose. Do not take double or extra doses. What may interact with this medication? Do not take this medicine with any of the following medications: epoetin alfa This list may not describe all possible interactions. Give your health care provider a list of all the medicines, herbs, non-prescription drugs, or dietary supplements you use. Also tell them if you smoke, drink alcohol, or use illegal drugs. Some items may interact with your medicine. What should I watch for while using this medication? Your condition will be monitored carefully while you are receiving this medicine. You may need blood work done while you are taking this medicine. This medicine may cause a decrease in vitamin B6. You should make sure that you get enough vitamin B6 while you are taking this medicine. Discuss the foods you eat and the vitamins you take with your health care professional. What side effects may I notice from receiving this medication? Side effects that you should report to your doctor or health care professional as soon as possible: allergic reactions like skin rash, itching or hives, swelling of the face, lips, or tongue breathing problems changes in vision chest pain confusion, trouble speaking or understanding feeling faint or lightheaded, falls high blood pressure   muscle aches or pains pain, swelling, warmth in the leg rapid weight gain severe headaches sudden numbness or weakness of the face, arm or leg trouble walking, dizziness, loss of balance or coordination seizures (convulsions) swelling of the ankles, feet, hands unusually weak or tired Side effects that usually do not require medical  attention (report to your doctor or health care professional if they continue or are bothersome): diarrhea fever, chills (flu-like symptoms) headaches nausea, vomiting redness, stinging, or swelling at site where injected This list may not describe all possible side effects. Call your doctor for medical advice about side effects. You may report side effects to FDA at 1-800-FDA-1088. Where should I keep my medication? Keep out of the reach of children. Store in a refrigerator between 2 and 8 degrees C (36 and 46 degrees F). Do not freeze. Do not shake. Throw away any unused portion if using a single-dose vial. Throw away any unused medicine after the expiration date. NOTE: This sheet is a summary. It may not cover all possible information. If you have questions about this medicine, talk to your doctor, pharmacist, or health care provider.  2022 Elsevier/Gold Standard (2017-10-20 16:44:20)  

## 2021-04-02 LAB — CANCER ANTIGEN 27.29: CA 27.29: 432 U/mL — ABNORMAL HIGH (ref 0.0–38.6)

## 2021-04-03 ENCOUNTER — Other Ambulatory Visit: Payer: Self-pay

## 2021-04-03 ENCOUNTER — Inpatient Hospital Stay: Payer: No Typology Code available for payment source

## 2021-04-03 DIAGNOSIS — I130 Hypertensive heart and chronic kidney disease with heart failure and stage 1 through stage 4 chronic kidney disease, or unspecified chronic kidney disease: Secondary | ICD-10-CM | POA: Diagnosis not present

## 2021-04-03 DIAGNOSIS — N189 Chronic kidney disease, unspecified: Secondary | ICD-10-CM

## 2021-04-03 MED ORDER — SODIUM CHLORIDE 0.9% IV SOLUTION
250.0000 mL | Freq: Once | INTRAVENOUS | Status: AC
  Administered 2021-04-03: 250 mL via INTRAVENOUS
  Filled 2021-04-03: qty 250

## 2021-04-03 MED ORDER — ACETAMINOPHEN 325 MG PO TABS
650.0000 mg | ORAL_TABLET | Freq: Once | ORAL | Status: AC
  Administered 2021-04-03: 650 mg via ORAL

## 2021-04-03 MED ORDER — ACETAMINOPHEN 325 MG PO TABS
ORAL_TABLET | ORAL | Status: AC
  Filled 2021-04-03: qty 2

## 2021-04-03 MED ORDER — DIPHENHYDRAMINE HCL 25 MG PO CAPS
25.0000 mg | ORAL_CAPSULE | Freq: Once | ORAL | Status: AC
Start: 2021-04-03 — End: 2021-04-03
  Administered 2021-04-03: 25 mg via ORAL

## 2021-04-03 MED ORDER — DIPHENHYDRAMINE HCL 25 MG PO CAPS
ORAL_CAPSULE | ORAL | Status: AC
  Filled 2021-04-03: qty 1

## 2021-04-03 NOTE — Patient Instructions (Signed)
https://www.[facility].org/donate-blood/blood-donation-process/what-happens-to-donated-blood/blood-transfusions/types-of-[email or web address].html"> https://www.hematology.org/education/patients/blood-basics/blood-safety-and-matching"> https://www.nhlbi.nih.gov/health-topics/blood-transfusion">  Blood Transfusion, Adult A blood transfusion is a procedure in which you receive blood or a type of blood cell (blood component) through an IV. You may need a blood transfusion when your blood level is low. This may result from a bleeding disorder, illness, injury, or surgery. The blood may come from a donor. You may also be able to donate blood for yourself (autologous blood donation) before a planned surgery. The blood given in a transfusion is made up of different blood components. You may receive: Red blood cells. These carry oxygen to the cells in the body. Platelets. These help your blood to clot. Plasma. This is the liquid part of your blood. It carries proteins and other substances throughout the body. White blood cells. These help you fight infections. If you have hemophilia or another clotting disorder, you may also receive othertypes of blood products. Tell a health care provider about: Any blood disorders you have. Any previous reactions you have had during a blood transfusion. Any allergies you have. All medicines you are taking, including vitamins, herbs, eye drops, creams, and over-the-counter medicines. Any surgeries you have had. Any medical conditions you have, including any recent fever or cold symptoms. Whether you are pregnant or may be pregnant. What are the risks? Generally, this is a safe procedure. However, problems may occur. The most common problems include: A mild allergic reaction, such as red, swollen areas of skin (hives) and itching. Fever or chills. This may be the body's response to new blood cells received. This may occur during or up to 4 hours after the  transfusion. More serious problems may include: Transfusion-associated circulatory overload (TACO), or too much fluid in the lungs. This may cause breathing problems. A serious allergic reaction, such as difficulty breathing or swelling around the face and lips. Transfusion-related acute lung injury (TRALI), which causes breathing difficulty and low oxygen in the blood. This can occur within hours of the transfusion or several days later. Iron overload. This can happen after receiving many blood transfusions over a period of time. Infection or virus being transmitted. This is rare because donated blood is carefully tested before it is given. Hemolytic transfusion reaction. This is rare. It happens when your body's defense system (immune system)tries to attack the new blood cells. Symptoms may include fever, chills, nausea, low blood pressure, and low back or chest pain. Transfusion-associated graft-versus-host disease (TAGVHD). This is rare. It happens when donated cells attack your body's healthy tissues. What happens before the procedure? Medicines Ask your health care provider about: Changing or stopping your regular medicines. This is especially important if you are taking diabetes medicines or blood thinners. Taking medicines such as aspirin and ibuprofen. These medicines can thin your blood. Do not take these medicines unless your health care provider tells you to take them. Taking over-the-counter medicines, vitamins, herbs, and supplements. General instructions Follow instructions from your health care provider about eating and drinking restrictions. You will have a blood test to determine your blood type. This is necessary to know what kind of blood your body will accept and to match it to the donor blood. If you are going to have a planned surgery, you may be able to do an autologous blood donation. This may be done in case you need to have a transfusion. You will have your temperature,  blood pressure, and pulse monitored before the transfusion. If you have had an allergic reaction to a transfusion in the past, you may be given   medicine to help prevent a reaction. This medicine may be given to you by mouth (orally) or through an IV. Set aside time for the blood transfusion. This procedure generally takes 1-4 hours to complete. What happens during the procedure?  An IV will be inserted into one of your veins. The bag of donated blood will be attached to your IV. The blood will then enter through your vein. Your temperature, blood pressure, and pulse will be monitored regularly during the transfusion. This monitoring is done to detect early signs of a transfusion reaction. Tell your nurse right away if you have any of these symptoms during the transfusion: Shortness of breath or trouble breathing. Chest or back pain. Fever or chills. Hives or itching. If you have any signs or symptoms of a reaction, your transfusion will be stopped and you may be given medicine. When the transfusion is complete, your IV will be removed. Pressure may be applied to the IV site for a few minutes. A bandage (dressing)will be applied. The procedure may vary among health care providers and hospitals. What happens after the procedure? Your temperature, blood pressure, pulse, breathing rate, and blood oxygen level will be monitored until you leave the hospital or clinic. Your blood may be tested to see how you are responding to the transfusion. You may be warmed with fluids or blankets to maintain a normal body temperature. If you receive your blood transfusion in an outpatient setting, you will be told whom to contact to report any reactions. Where to find more information For more information on blood transfusions, visit the [employer]: redcross.org Summary A blood transfusion is a procedure in which you receive blood or a type of blood cell (blood component) through an IV. The blood you  receive may come from a donor or be donated by yourself (autologous blood donation) before a planned surgery. The blood given in a transfusion is made up of different blood components. You may receive red blood cells, platelets, plasma, or white blood cells depending on the condition treated. Your temperature, blood pressure, and pulse will be monitored before, during, and after the transfusion. After the transfusion, your blood may be tested to see how your body has responded. This information is not intended to replace advice given to you by your health care provider. Make sure you discuss any questions you have with your healthcare provider. Document Revised: 08/10/2019 Document Reviewed: 03/30/2019 Elsevier Patient Education  2022 [employer]  

## 2021-04-04 LAB — TYPE AND SCREEN
ABO/RH(D): B POS
Antibody Screen: POSITIVE
DAT, IgG: NEGATIVE
Unit division: 0
Unit division: 0

## 2021-04-04 LAB — BPAM RBC
Blood Product Expiration Date: 202207072359
Blood Product Expiration Date: 202207092359
ISSUE DATE / TIME: 202206160854
ISSUE DATE / TIME: 202206160854
Unit Type and Rh: 1700
Unit Type and Rh: 1700

## 2021-04-07 ENCOUNTER — Telehealth: Payer: Self-pay

## 2021-04-07 NOTE — Telephone Encounter (Signed)
Pt called stating capecitabine would be $1400 this month and she simply can not afford this. Message sent to [employer] [facility] to inquire about discounts. Pt aware and verbalizes thanks.

## 2021-04-08 ENCOUNTER — Other Ambulatory Visit: Payer: Self-pay | Admitting: *Deleted

## 2021-04-08 ENCOUNTER — Other Ambulatory Visit (HOSPITAL_COMMUNITY): Payer: Self-pay

## 2021-04-08 ENCOUNTER — Encounter: Payer: Self-pay | Admitting: Adult Health

## 2021-04-08 MED ORDER — DEFERIPRONE 1000 MG PO TABS
1000.0000 mg | ORAL_TABLET | Freq: Two times a day (BID) | ORAL | 1 refills | Status: AC
Start: 2021-04-08 — End: ?

## 2021-04-09 ENCOUNTER — Other Ambulatory Visit (HOSPITAL_COMMUNITY): Payer: Self-pay

## 2021-04-09 ENCOUNTER — Other Ambulatory Visit: Payer: Self-pay | Admitting: Oncology

## 2021-04-09 ENCOUNTER — Telehealth: Payer: Self-pay

## 2021-04-09 DIAGNOSIS — C50812 Malignant neoplasm of overlapping sites of left female breast: Secondary | ICD-10-CM

## 2021-04-09 NOTE — Telephone Encounter (Signed)
Patient reached out because her deductible started over in June and her copay for #56 Capecitabine (Xeloda) would be $1400. Once deductible is met her copay would go back to $0.  I suggested that she check with [facility] (where she is required to fill through insurance) to see if they have a payment plan until she meets her deductible. She stated she did not have the money to pay once or over time.   Patient is not eligible for manufacturer assistance or grants based on her insurance.  I also gave her our only other option to apply for eligibility through [employer]. Her copay through them would be @ $90 for #56 tablets of Xeloda. I gave her the website to apply and asked her to call back to update and we would send in an rx. Patient stated she would apply.  [facility] [place] Phone [phone] Fax [phone] 04/09/2021 12:00 PM

## 2021-04-10 ENCOUNTER — Encounter: Payer: Self-pay | Admitting: Adult Health

## 2021-04-22 ENCOUNTER — Inpatient Hospital Stay (HOSPITAL_BASED_OUTPATIENT_CLINIC_OR_DEPARTMENT_OTHER): Payer: No Typology Code available for payment source | Admitting: Oncology

## 2021-04-22 ENCOUNTER — Inpatient Hospital Stay: Payer: No Typology Code available for payment source | Attending: Oncology

## 2021-04-22 ENCOUNTER — Other Ambulatory Visit: Payer: Self-pay

## 2021-04-22 ENCOUNTER — Inpatient Hospital Stay: Payer: No Typology Code available for payment source

## 2021-04-22 ENCOUNTER — Other Ambulatory Visit: Payer: Self-pay | Admitting: *Deleted

## 2021-04-22 VITALS — BP 101/64 | HR 99 | Temp 97.1°F | Resp 18 | Wt 128.0 lb

## 2021-04-22 DIAGNOSIS — I509 Heart failure, unspecified: Secondary | ICD-10-CM | POA: Insufficient documentation

## 2021-04-22 DIAGNOSIS — Z803 Family history of malignant neoplasm of breast: Secondary | ICD-10-CM | POA: Insufficient documentation

## 2021-04-22 DIAGNOSIS — N189 Chronic kidney disease, unspecified: Secondary | ICD-10-CM

## 2021-04-22 DIAGNOSIS — D631 Anemia in chronic kidney disease: Secondary | ICD-10-CM | POA: Insufficient documentation

## 2021-04-22 DIAGNOSIS — Z170 Estrogen receptor positive status [ER+]: Secondary | ICD-10-CM | POA: Diagnosis not present

## 2021-04-22 DIAGNOSIS — M3214 Glomerular disease in systemic lupus erythematosus: Secondary | ICD-10-CM | POA: Insufficient documentation

## 2021-04-22 DIAGNOSIS — Z923 Personal history of irradiation: Secondary | ICD-10-CM | POA: Diagnosis not present

## 2021-04-22 DIAGNOSIS — N179 Acute kidney failure, unspecified: Secondary | ICD-10-CM

## 2021-04-22 DIAGNOSIS — Z8249 Family history of ischemic heart disease and other diseases of the circulatory system: Secondary | ICD-10-CM | POA: Diagnosis not present

## 2021-04-22 DIAGNOSIS — Z9221 Personal history of antineoplastic chemotherapy: Secondary | ICD-10-CM | POA: Diagnosis not present

## 2021-04-22 DIAGNOSIS — C50912 Malignant neoplasm of unspecified site of left female breast: Secondary | ICD-10-CM

## 2021-04-22 DIAGNOSIS — D561 Beta thalassemia: Secondary | ICD-10-CM

## 2021-04-22 DIAGNOSIS — Z79810 Long term (current) use of selective estrogen receptor modulators (SERMs): Secondary | ICD-10-CM | POA: Diagnosis not present

## 2021-04-22 DIAGNOSIS — C50512 Malignant neoplasm of lower-outer quadrant of left female breast: Secondary | ICD-10-CM | POA: Diagnosis not present

## 2021-04-22 DIAGNOSIS — I700 Atherosclerosis of aorta: Secondary | ICD-10-CM

## 2021-04-22 DIAGNOSIS — N184 Chronic kidney disease, stage 4 (severe): Secondary | ICD-10-CM | POA: Diagnosis present

## 2021-04-22 DIAGNOSIS — Z79899 Other long term (current) drug therapy: Secondary | ICD-10-CM | POA: Diagnosis not present

## 2021-04-22 DIAGNOSIS — Z9012 Acquired absence of left breast and nipple: Secondary | ICD-10-CM | POA: Diagnosis not present

## 2021-04-22 DIAGNOSIS — C7951 Secondary malignant neoplasm of bone: Secondary | ICD-10-CM

## 2021-04-22 DIAGNOSIS — Z833 Family history of diabetes mellitus: Secondary | ICD-10-CM | POA: Diagnosis not present

## 2021-04-22 DIAGNOSIS — I130 Hypertensive heart and chronic kidney disease with heart failure and stage 1 through stage 4 chronic kidney disease, or unspecified chronic kidney disease: Secondary | ICD-10-CM | POA: Diagnosis not present

## 2021-04-22 DIAGNOSIS — C771 Secondary and unspecified malignant neoplasm of intrathoracic lymph nodes: Secondary | ICD-10-CM

## 2021-04-22 DIAGNOSIS — D61818 Other pancytopenia: Secondary | ICD-10-CM

## 2021-04-22 DIAGNOSIS — Z6828 Body mass index (BMI) 28.0-28.9, adult: Secondary | ICD-10-CM | POA: Insufficient documentation

## 2021-04-22 DIAGNOSIS — C50812 Malignant neoplasm of overlapping sites of left female breast: Secondary | ICD-10-CM

## 2021-04-22 DIAGNOSIS — M329 Systemic lupus erythematosus, unspecified: Secondary | ICD-10-CM

## 2021-04-22 DIAGNOSIS — R197 Diarrhea, unspecified: Secondary | ICD-10-CM | POA: Diagnosis not present

## 2021-04-22 DIAGNOSIS — Z7189 Other specified counseling: Secondary | ICD-10-CM

## 2021-04-22 LAB — CBC WITH DIFFERENTIAL/PLATELET
Abs Immature Granulocytes: 0 10*3/uL (ref 0.00–0.07)
Basophils Absolute: 0 10*3/uL (ref 0.0–0.1)
Basophils Relative: 0 %
Eosinophils Absolute: 0 10*3/uL (ref 0.0–0.5)
Eosinophils Relative: 1 %
HCT: 21.7 % — ABNORMAL LOW (ref 36.0–46.0)
Hemoglobin: 7.6 g/dL — ABNORMAL LOW (ref 12.0–15.0)
Immature Granulocytes: 0 %
Lymphocytes Relative: 38 %
Lymphs Abs: 0.5 10*3/uL — ABNORMAL LOW (ref 0.7–4.0)
MCH: 28.7 pg (ref 26.0–34.0)
MCHC: 35 g/dL (ref 30.0–36.0)
MCV: 81.9 fL (ref 80.0–100.0)
Monocytes Absolute: 0.1 10*3/uL (ref 0.1–1.0)
Monocytes Relative: 4 %
Neutro Abs: 0.8 10*3/uL — ABNORMAL LOW (ref 1.7–7.7)
Neutrophils Relative %: 57 %
Platelets: 55 10*3/uL — ABNORMAL LOW (ref 150–400)
RBC: 2.65 MIL/uL — ABNORMAL LOW (ref 3.87–5.11)
RDW: 15.5 % (ref 11.5–15.5)
WBC: 1.4 10*3/uL — ABNORMAL LOW (ref 4.0–10.5)
nRBC: 0 % (ref 0.0–0.2)

## 2021-04-22 LAB — IRON AND TIBC
Iron: 209 ug/dL — ABNORMAL HIGH (ref 41–142)
Saturation Ratios: 105 % — ABNORMAL HIGH (ref 21–57)
TIBC: 200 ug/dL — ABNORMAL LOW (ref 236–444)
UIBC: UNDETERMINED ug/dL (ref 120–384)

## 2021-04-22 LAB — COMPREHENSIVE METABOLIC PANEL
ALT: 6 U/L (ref 0–44)
AST: 17 U/L (ref 15–41)
Albumin: 3.6 g/dL (ref 3.5–5.0)
Alkaline Phosphatase: 113 U/L (ref 38–126)
Anion gap: 16 — ABNORMAL HIGH (ref 5–15)
BUN: 79 mg/dL — ABNORMAL HIGH (ref 6–20)
CO2: 17 mmol/L — ABNORMAL LOW (ref 22–32)
Calcium: 8.5 mg/dL — ABNORMAL LOW (ref 8.9–10.3)
Chloride: 107 mmol/L (ref 98–111)
Creatinine, Ser: 2.45 mg/dL — ABNORMAL HIGH (ref 0.44–1.00)
GFR, Estimated: 23 mL/min — ABNORMAL LOW (ref >60)
Glucose, Bld: 90 mg/dL (ref 70–99)
Potassium: 3.3 mmol/L — ABNORMAL LOW (ref 3.5–5.1)
Sodium: 140 mmol/L (ref 135–145)
Total Bilirubin: 0.5 mg/dL (ref 0.3–1.2)
Total Protein: 7 g/dL (ref 6.5–8.1)

## 2021-04-22 LAB — RETICULOCYTES
Immature Retic Fract: 8 % (ref 2.3–15.9)
RBC.: 2.63 MIL/uL — ABNORMAL LOW (ref 3.87–5.11)
Retic Count, Absolute: 8.9 10*3/uL — ABNORMAL LOW (ref 19.0–186.0)
Retic Ct Pct: <0.4 % — ABNORMAL LOW (ref 0.4–3.1)

## 2021-04-22 LAB — SAMPLE TO BLOOD BANK

## 2021-04-22 LAB — FERRITIN: Ferritin: 7646 ng/mL — ABNORMAL HIGH (ref 11–307)

## 2021-04-22 MED ORDER — DARBEPOETIN ALFA 500 MCG/ML IJ SOSY
PREFILLED_SYRINGE | INTRAMUSCULAR | Status: AC
  Filled 2021-04-22: qty 1

## 2021-04-22 MED ORDER — DARBEPOETIN ALFA 500 MCG/ML IJ SOSY
500.0000 ug | PREFILLED_SYRINGE | Freq: Once | INTRAMUSCULAR | Status: AC
  Administered 2021-04-22: 500 ug via SUBCUTANEOUS
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
## 2021-04-22 NOTE — Patient Instructions (Signed)
Darbepoetin Alfa injection What is this medication? DARBEPOETIN ALFA (dar be POE e tin AL fa) helps your body make more red blood cells. It is used to treat anemia caused by chronic kidney failure and chemotherapy. This medicine may be used for other purposes; ask your health care provider or pharmacist if you have questions. COMMON BRAND NAME(S): Aranesp What should I tell my care team before I take this medication? They need to know if you have any of these conditions: blood clotting disorders or history of blood clots cancer patient not on chemotherapy cystic fibrosis heart disease, such as angina, heart failure, or a history of a heart attack hemoglobin level of 12 g/dL or greater high blood pressure low levels of folate, iron, or vitamin B12 seizures an unusual or allergic reaction to darbepoetin, erythropoietin, albumin, hamster proteins, latex, other medicines, foods, dyes, or preservatives pregnant or trying to get pregnant breast-feeding How should I use this medication? This medicine is for injection into a vein or under the skin. It is usually given by a health care professional in a hospital or clinic setting. If you get this medicine at home, you will be taught how to prepare and give this medicine. Use exactly as directed. Take your medicine at regular intervals. Do not take your medicine more often than directed. It is important that you put your used needles and syringes in a special sharps container. Do not put them in a trash can. If you do not have a sharps container, call your pharmacist or healthcare provider to get one. A special MedGuide will be given to you by the pharmacist with each prescription and refill. Be sure to read this information carefully each time. Talk to your pediatrician regarding the use of this medicine in children. While this medicine may be used in children as young as 1 month of age for selected conditions, precautions do apply. Overdosage: If you  think you have taken too much of this medicine contact a poison control center or emergency room at once. NOTE: This medicine is only for you. Do not share this medicine with others. What if I miss a dose? If you miss a dose, take it as soon as you can. If it is almost time for your next dose, take only that dose. Do not take double or extra doses. What may interact with this medication? Do not take this medicine with any of the following medications: epoetin alfa This list may not describe all possible interactions. Give your health care provider a list of all the medicines, herbs, non-prescription drugs, or dietary supplements you use. Also tell them if you smoke, drink alcohol, or use illegal drugs. Some items may interact with your medicine. What should I watch for while using this medication? Your condition will be monitored carefully while you are receiving this medicine. You may need blood work done while you are taking this medicine. This medicine may cause a decrease in vitamin B6. You should make sure that you get enough vitamin B6 while you are taking this medicine. Discuss the foods you eat and the vitamins you take with your health care professional. What side effects may I notice from receiving this medication? Side effects that you should report to your doctor or health care professional as soon as possible: allergic reactions like skin rash, itching or hives, swelling of the face, lips, or tongue breathing problems changes in vision chest pain confusion, trouble speaking or understanding feeling faint or lightheaded, falls high blood pressure   severe headaches sudden numbness or weakness of the face, arm or leg trouble walking, dizziness, loss of balance or coordination seizures (convulsions) swelling of the ankles, feet, hands unusually weak or tired Side effects that usually do not require medical attention  (report to yourdoctor or health care professional if they continue or are bothersome): diarrhea fever, chills (flu-like symptoms) headaches nausea, vomiting redness, stinging, or swelling at site where injected This list may not describe all possible side effects. Call your doctor for medical advice about side effects. You may report side effects to FDA at1-800-FDA-1088. Where should I keep my medication? Keep out of the reach of children. Store in a refrigerator between 2 and 8 degrees C (36 and 46 degrees F). Do not freeze. Do not shake. Throw away any unused portion if using a single-dosevial. Throw away any unused medicine after the expiration date. NOTE: This sheet is a summary. It may not cover all possible information. If you have questions about this medicine, talk to your doctor, pharmacist, orhealth care provider.  2022 Elsevier/Gold Standard (2017-10-20 16:44:20) Darbepoetin Alfa injection What is this medication? DARBEPOETIN ALFA (dar be POE e tin AL fa) helps your body make more red blood cells. It is used to treat anemia caused by chronic kidney failure andchemotherapy. This medicine may be used for other purposes; ask your health care provider orpharmacist if you have questions. COMMON BRAND NAME(S): Aranesp What should I tell my care team before I take this medication? They need to know if you have any of these conditions: blood clotting disorders or history of blood clots cancer patient not on chemotherapy cystic fibrosis heart disease, such as angina, heart failure, or a history of a heart attack hemoglobin level of 12 g/dL or greater high blood pressure low levels of folate, iron, or vitamin B12 seizures an unusual or allergic reaction to darbepoetin, erythropoietin, albumin, hamster proteins, latex, other medicines, foods, dyes, or preservatives pregnant or trying to get pregnant breast-feeding How should I use this medication? This medicine is for injection into a  vein or under the skin. It is [place] by a health care professional in a hospital or clinic setting. If you get this medicine at home, you will be taught how to prepare and give this medicine. Use exactly as directed. Take your medicine at regularintervals. Do not take your medicine more often than directed. It is important that you put your used needles and syringes in a special sharps container. Do not put them in a trash can. If you do not have a sharpscontainer, call your pharmacist or healthcare provider to get one. A special MedGuide will be given to you by the pharmacist with eachprescription and refill. Be sure to read this information carefully each time. Talk to your pediatrician regarding the use of this medicine in children. While this medicine may be used in children as young as 65 month of age for selectedconditions, precautions do apply. Overdosage: If you think you have taken too much of this medicine contact apoison control center or emergency room at once. NOTE: This medicine is only for you. Do not share this medicine with others. What if I miss a dose? If you miss a dose, take it as soon as you can. If it is almost time for yournext dose, take only that dose. Do not take double or extra doses. What may interact with this medication? Do not take this medicine with any of the following medications: epoetin alfa This list may not describe all possible  interactions. Give your health care provider a list of all the medicines, herbs, non-prescription drugs, or dietary supplements you use. Also tell them if you smoke, drink alcohol, or use illegaldrugs. Some items may interact with your medicine. What should I watch for while using this medication? Your condition will be monitored carefully while you are receiving thismedicine. You may need blood work done while you are taking this medicine. This medicine may cause a decrease in vitamin B6. You should make sure that you get enough  vitamin B6 while you are taking this medicine. Discuss the foods youeat and the vitamins you take with your health care professional. What side effects may I notice from receiving this medication? Side effects that you should report to your doctor or health care professionalas soon as possible: allergic reactions like skin rash, itching or hives, swelling of the face, lips, or tongue breathing problems changes in vision chest pain confusion, trouble speaking or understanding feeling faint or lightheaded, falls high blood pressure muscle aches or pains pain, swelling, warmth in the leg rapid weight gain severe headaches sudden numbness or weakness of the face, arm or leg trouble walking, dizziness, loss of balance or coordination seizures (convulsions) swelling of the ankles, feet, hands unusually weak or tired Side effects that usually do not require medical attention (report to yourdoctor or health care professional if they continue or are bothersome): diarrhea fever, chills (flu-like symptoms) headaches nausea, vomiting redness, stinging, or swelling at site where injected This list may not describe all possible side effects. Call your doctor for medical advice about side effects. You may report side effects to FDA at1-800-FDA-1088. Where should I keep my medication? Keep out of the reach of children. Store in a refrigerator between 2 and 8 degrees C (36 and 46 degrees F). Do not freeze. Do not shake. Throw away any unused portion if using a single-dosevial. Throw away any unused medicine after the expiration date. NOTE: This sheet is a summary. It may not cover all possible information. If you have questions about this medicine, talk to your doctor, pharmacist, orhealth care provider.  2022 Elsevier/Gold Standard (2017-10-20 16:44:20) Implanted Port Insertion, Care After This sheet gives you information about how to care for yourself after your procedure. Your health care provider  may also give you more specific instructions. If you have problems or questions, contact your health careprovider. What can I expect after the procedure? After the procedure, it is common to have: Discomfort at the port insertion site. Bruising on the skin over the port. This should improve over 3-4 days. Follow these instructions at home: [facility] care After your port is placed, you will get a manufacturer's information card. The card has information about your port. Keep this card with you at all times. Take care of the port as told by your health care provider. Ask your health care provider if you or a family member can get training for taking care of the port at home. A home health care nurse may also take care of the port. Make sure to remember what type of port you have. Incision care     Follow instructions from your health care provider about how to take care of your port insertion site. Make sure you: Wash your hands with soap and water before and after you change your bandage (dressing). If soap and water are not available, use hand sanitizer. Change your dressing as told by your health care provider. Leave stitches (sutures), skin glue, or adhesive strips  in place. These skin closures may need to stay in place for 2 weeks or longer. If adhesive strip edges start to loosen and curl up, you may trim the loose edges. Do not remove adhesive strips completely unless your health care provider tells you to do that. Check your port insertion site every day for signs of infection. Check for: Redness, swelling, or pain. Fluid or blood. Warmth. Pus or a bad smell. Activity Return to your normal activities as told by your health care provider. Ask your health care provider what activities are safe for you. Do not lift anything that is heavier than 10 lb (4.5 kg), or the limit that you are told, until your health care provider says that it is safe. General instructions Take over-the-counter and  prescription medicines only as told by your health care provider. Do not take baths, swim, or use a hot tub until your health care provider approves. Ask your health care provider if you may take showers. You may only be allowed to take sponge baths. Do not drive for 24 hours if you were given a sedative during your procedure. Wear a medical alert bracelet in case of an emergency. This will tell any health care providers that you have a port. Keep all follow-up visits as told by your health care provider. This is important. Contact a health care provider if: You cannot flush your port with saline as directed, or you cannot draw blood from the port. You have a fever or chills. You have redness, swelling, or pain around your port insertion site. You have fluid or blood coming from your port insertion site. Your port insertion site feels warm to the touch. You have pus or a bad smell coming from the port insertion site. Get help right away if: You have chest pain or shortness of breath. You have bleeding from your port that you cannot control. Summary Take care of the port as told by your health care provider. Keep the manufacturer's information card with you at all times. Change your dressing as told by your health care provider. Contact a health care provider if you have a fever or chills or if you have redness, swelling, or pain around your port insertion site. Keep all follow-up visits as told by your health care provider. This information is not intended to replace advice given to you by your health care provider. Make sure you discuss any questions you have with your healthcare provider. Document Revised: 05/03/2018 Document Reviewed: 05/03/2018 Elsevier Patient Education  [place].

## 2021-04-24 ENCOUNTER — Other Ambulatory Visit: Payer: Self-pay | Admitting: *Deleted

## 2021-04-24 DIAGNOSIS — D649 Anemia, unspecified: Secondary | ICD-10-CM

## 2021-04-24 LAB — PREPARE RBC (CROSSMATCH)

## 2021-04-24 NOTE — Progress Notes (Signed)
Per pt's phone call this am - stating desire to proceed with blood transfusion this week.  Note she had type and hold 7/5 and declined blood infusion at that time. Pt takes 1 business day to type - to use current typing transfusion would need to be done tomorrow or pt would need new Type and Cross.  She is noting increased symptoms and feel transfusion would be beneficial.  This RN verified with pt she is still wearing blood band bracelet.  Arranged for transfusion to be done at [facility] per availability.  Orders placed and released - verified with [place] in blood bank.  This RN faxed pick up slip with verified blood bracelet number of [phone] to Blood bank and to [facility].

## 2021-04-24 NOTE — Progress Notes (Unsigned)
See other entry 

## 2021-04-25 ENCOUNTER — Inpatient Hospital Stay: Payer: No Typology Code available for payment source

## 2021-04-25 ENCOUNTER — Other Ambulatory Visit: Payer: Self-pay

## 2021-04-25 DIAGNOSIS — I130 Hypertensive heart and chronic kidney disease with heart failure and stage 1 through stage 4 chronic kidney disease, or unspecified chronic kidney disease: Secondary | ICD-10-CM | POA: Diagnosis not present

## 2021-04-25 DIAGNOSIS — D649 Anemia, unspecified: Secondary | ICD-10-CM

## 2021-04-25 MED ORDER — ACETAMINOPHEN 325 MG PO TABS
ORAL_TABLET | ORAL | Status: AC
  Filled 2021-04-25: qty 2

## 2021-04-25 MED ORDER — DIPHENHYDRAMINE HCL 25 MG PO CAPS
25.0000 mg | ORAL_CAPSULE | Freq: Once | ORAL | Status: AC
  Administered 2021-04-25: 25 mg via ORAL

## 2021-04-25 MED ORDER — SODIUM CHLORIDE 0.9% IV SOLUTION
250.0000 mL | Freq: Once | INTRAVENOUS | Status: AC
  Administered 2021-04-25: 250 mL via INTRAVENOUS
  Filled 2021-04-25: qty 250

## 2021-04-25 MED ORDER — DIPHENHYDRAMINE HCL 25 MG PO CAPS
ORAL_CAPSULE | ORAL | Status: AC
  Filled 2021-04-25: qty 1

## 2021-04-25 MED ORDER — ACETAMINOPHEN 325 MG PO TABS
650.0000 mg | ORAL_TABLET | Freq: Once | ORAL | Status: AC
  Administered 2021-04-25: 650 mg via ORAL

## 2021-04-25 NOTE — Patient Instructions (Signed)
https://www.[facility].org/donate-blood/blood-donation-process/what-happens-to-donated-blood/blood-transfusions/types-of-[email or web address].html"> https://www.hematology.org/education/patients/blood-basics/blood-safety-and-matching"> https://www.nhlbi.nih.gov/health-topics/blood-transfusion">  Blood Transfusion, Adult A blood transfusion is a procedure in which you receive blood or a type of blood cell (blood component) through an IV. You may need a blood transfusion when your blood level is low. This may result from a bleeding disorder, illness, injury, or surgery. The blood may come from a donor. You may also be able to donate blood for yourself (autologous blood donation) before a planned surgery. The blood given in a transfusion is made up of different blood components. You may receive: Red blood cells. These carry oxygen to the cells in the body. Platelets. These help your blood to clot. Plasma. This is the liquid part of your blood. It carries proteins and other substances throughout the body. White blood cells. These help you fight infections. If you have hemophilia or another clotting disorder, you may also receive othertypes of blood products. Tell a health care provider about: Any blood disorders you have. Any previous reactions you have had during a blood transfusion. Any allergies you have. All medicines you are taking, including vitamins, herbs, eye drops, creams, and over-the-counter medicines. Any surgeries you have had. Any medical conditions you have, including any recent fever or cold symptoms. Whether you are pregnant or may be pregnant. What are the risks? Generally, this is a safe procedure. However, problems may occur. The most common problems include: A mild allergic reaction, such as red, swollen areas of skin (hives) and itching. Fever or chills. This may be the body's response to new blood cells received. This may occur during or up to 4 hours after the  transfusion. More serious problems may include: Transfusion-associated circulatory overload (TACO), or too much fluid in the lungs. This may cause breathing problems. A serious allergic reaction, such as difficulty breathing or swelling around the face and lips. Transfusion-related acute lung injury (TRALI), which causes breathing difficulty and low oxygen in the blood. This can occur within hours of the transfusion or several days later. Iron overload. This can happen after receiving many blood transfusions over a period of time. Infection or virus being transmitted. This is rare because donated blood is carefully tested before it is given. Hemolytic transfusion reaction. This is rare. It happens when your body's defense system (immune system)tries to attack the new blood cells. Symptoms may include fever, chills, nausea, low blood pressure, and low back or chest pain. Transfusion-associated graft-versus-host disease (TAGVHD). This is rare. It happens when donated cells attack your body's healthy tissues. What happens before the procedure? Medicines Ask your health care provider about: Changing or stopping your regular medicines. This is especially important if you are taking diabetes medicines or blood thinners. Taking medicines such as aspirin and ibuprofen. These medicines can thin your blood. Do not take these medicines unless your health care provider tells you to take them. Taking over-the-counter medicines, vitamins, herbs, and supplements. General instructions Follow instructions from your health care provider about eating and drinking restrictions. You will have a blood test to determine your blood type. This is necessary to know what kind of blood your body will accept and to match it to the donor blood. If you are going to have a planned surgery, you may be able to do an autologous blood donation. This may be done in case you need to have a transfusion. You will have your temperature,  blood pressure, and pulse monitored before the transfusion. If you have had an allergic reaction to a transfusion in the past, you may be given   medicine to help prevent a reaction. This medicine may be given to you by mouth (orally) or through an IV. Set aside time for the blood transfusion. This procedure generally takes 1-4 hours to complete. What happens during the procedure?  An IV will be inserted into one of your veins. The bag of donated blood will be attached to your IV. The blood will then enter through your vein. Your temperature, blood pressure, and pulse will be monitored regularly during the transfusion. This monitoring is done to detect early signs of a transfusion reaction. Tell your nurse right away if you have any of these symptoms during the transfusion: Shortness of breath or trouble breathing. Chest or back pain. Fever or chills. Hives or itching. If you have any signs or symptoms of a reaction, your transfusion will be stopped and you may be given medicine. When the transfusion is complete, your IV will be removed. Pressure may be applied to the IV site for a few minutes. A bandage (dressing)will be applied. The procedure may vary among health care providers and hospitals. What happens after the procedure? Your temperature, blood pressure, pulse, breathing rate, and blood oxygen level will be monitored until you leave the hospital or clinic. Your blood may be tested to see how you are responding to the transfusion. You may be warmed with fluids or blankets to maintain a normal body temperature. If you receive your blood transfusion in an outpatient setting, you will be told whom to contact to report any reactions. Where to find more information For more information on blood transfusions, visit the [employer]: redcross.org Summary A blood transfusion is a procedure in which you receive blood or a type of blood cell (blood component) through an IV. The blood you  receive may come from a donor or be donated by yourself (autologous blood donation) before a planned surgery. The blood given in a transfusion is made up of different blood components. You may receive red blood cells, platelets, plasma, or white blood cells depending on the condition treated. Your temperature, blood pressure, and pulse will be monitored before, during, and after the transfusion. After the transfusion, your blood may be tested to see how your body has responded. This information is not intended to replace advice given to you by your health care provider. Make sure you discuss any questions you have with your healthcare provider. Document Revised: 08/10/2019 Document Reviewed: 03/30/2019 Elsevier Patient Education  2022 [employer]  

## 2021-04-27 LAB — TYPE AND SCREEN
ABO/RH(D): B POS
Antibody Screen: POSITIVE
DAT, IgG: NEGATIVE
Unit division: 0
Unit division: 0

## 2021-04-27 LAB — BPAM RBC
Blood Product Expiration Date: 202208042359
Blood Product Expiration Date: 202208042359
ISSUE DATE / TIME: 202207080801
ISSUE DATE / TIME: 202207080801
Unit Type and Rh: 7300
Unit Type and Rh: 7300

## 2021-05-01 ENCOUNTER — Encounter: Payer: Self-pay | Admitting: Adult Health

## 2021-05-01 NOTE — Telephone Encounter (Signed)
Opened in error

## 2021-05-05 ENCOUNTER — Telehealth: Payer: Self-pay | Admitting: *Deleted

## 2021-05-05 NOTE — Telephone Encounter (Signed)
This RN spoke with pt - who states " my husband did a home Covid test on me and it came back positive "  She states she isn't having symptoms but he tested positive last week and then rechecked himself today and he is negative- and then he wanted to test her.  He was concerned that he could have infected her.  Of note she states approximately 2 weeks ago she had a runny nose and cough ( mild cough now ) but did not have fever or sore throat. She did not test herself 2 weeks ago.  She is inquiring about " getting the antivirals "  Per review with LCC/NP and due to unknown onset of symptoms - concern that antivirals may not be beneficial.  This RN discussed above with pt who verbalized understanding as well as to call if symptoms do occur.  This RN will review pt's schedule for appropriate Covid protocol and her appointments.

## 2021-05-09 ENCOUNTER — Other Ambulatory Visit: Payer: Self-pay | Admitting: Adult Health

## 2021-05-09 ENCOUNTER — Ambulatory Visit (INDEPENDENT_AMBULATORY_CARE_PROVIDER_SITE_OTHER): Payer: No Typology Code available for payment source

## 2021-05-09 ENCOUNTER — Telehealth: Payer: Self-pay | Admitting: *Deleted

## 2021-05-09 DIAGNOSIS — L930 Discoid lupus erythematosus: Secondary | ICD-10-CM

## 2021-05-09 DIAGNOSIS — C7951 Secondary malignant neoplasm of bone: Secondary | ICD-10-CM

## 2021-05-09 MED ORDER — METHYLPREDNISOLONE SODIUM SUCC 125 MG IJ SOLR: 125.0000 mg | Freq: Once | INTRAMUSCULAR | Status: AC | PRN

## 2021-05-09 MED ORDER — FAMOTIDINE IN NACL 20-0.9 MG/50ML-% IV SOLN: 20.0000 mg | Freq: Once | INTRAVENOUS | Status: AC | PRN

## 2021-05-09 MED ORDER — EPINEPHRINE 0.3 MG/0.3ML IJ SOAJ: 0.3000 mg | Freq: Once | INTRAMUSCULAR | Status: AC | PRN

## 2021-05-09 MED ORDER — SODIUM CHLORIDE 0.9 % IV SOLN: INTRAVENOUS | Status: AC | PRN

## 2021-05-09 MED ORDER — BEBTELOVIMAB 175 MG/2 ML IV (EUA)
175.0000 mg | Freq: Once | INTRAMUSCULAR | Status: AC
  Administered 2021-05-09: 175 mg via INTRAVENOUS

## 2021-05-09 MED ORDER — DIPHENHYDRAMINE HCL 50 MG/ML IJ SOLN: 50.0000 mg | Freq: Once | INTRAMUSCULAR | Status: AC | PRN

## 2021-05-09 MED ORDER — ALBUTEROL SULFATE HFA 108 (90 BASE) MCG/ACT IN AERS: 2.0000 | INHALATION_SPRAY | Freq: Once | RESPIRATORY_TRACT | Status: AC | PRN

## 2021-05-09 NOTE — Progress Notes (Signed)
Diagnosis: COVID  Provider:  Marshell Garfinkel, MD  Procedure: Infusion  IV Type: Peripheral, IV Location: L Antecubital  Bebtelovimab, Dose: 175 mg  Infusion Start Time: 6203  Infusion Stop Time: 5597  Post Infusion IV Care: Observation period completed and Peripheral IV Discontinued  Discharge: Condition: Good, Destination: Home . AVS provided to patient.   Performed by:  Ludwig Lean, RN

## 2021-05-09 NOTE — Patient Instructions (Signed)
10 Things You Can Do to Manage Your COVID-19 Symptoms at Home If you have possible or confirmed COVID-19 Stay home except to get medical care. Monitor your symptoms carefully. If your symptoms get worse, call your healthcare provider immediately. Get rest and stay hydrated. If you have a medical appointment, call the healthcare provider ahead of time and tell them that you have or may have COVID-19. For medical emergencies, call 911 and notify the dispatch personnel that you have or may have COVID-19. Cover your cough and sneezes with a tissue or use the inside of your elbow. Wash your hands often with soap and water for at least 20 seconds or clean your hands with an alcohol-based hand sanitizer that contains at least 60% alcohol. As much as possible, stay in a specific room and away from other people in your home. Also, you should use a separate bathroom, if available. If you need to be around other people in or outside of the home, wear a mask. Avoid sharing personal items with other people in your household, like dishes, towels, and bedding. Clean all surfaces that are touched often, like counters, tabletops, and doorknobs. Use household cleaning sprays or wipes according to the label instructions. [email or web address] 05/03/2020 This information is not intended to replace advice given to you by your health care provider. Make sure you discuss any questions you have with your healthcare provider. Document Revised: 11/22/2020 Document Reviewed: 11/22/2020 Elsevier Patient Education  [place]. What types of side effects do monoclonal antibody drugs cause?  Common side effects  In general, the more common side effects caused by monoclonal antibody drugs include: Allergic reactions, such as hives or itching Flu-like signs and symptoms, including chills, fatigue, fever, and muscle aches and pains Nausea, vomiting Diarrhea Skin rashes Low blood pressure   The CDC is recommending  patients who receive monoclonal antibody treatments wait at least 90 days before being vaccinated.  Currently, there are no data on the safety and efficacy of mRNA COVID-19 vaccines in persons who received monoclonal antibodies or convalescent plasma as part of COVID-19 treatment. Based on the estimated half-life of such therapies as well as evidence suggesting that reinfection is uncommon in the 90 days after initial infection, vaccination should be deferred for at least 90 days, as a precautionary measure until additional information becomes available, to avoid interference of the antibody treatment with vaccine-induced immune responses.  Patient reviewed Fact Sheet for Patients, Parents, and Caregivers for Emergency Use Authorization (EUA) of sotrovimab for the Treatment of Coronavirus. Patient also reviewed and is agreeable to the estimated cost of treatment. Patient is agreeable to proceed.

## 2021-05-09 NOTE — Telephone Encounter (Signed)
This RN received call from the patient stating her symptoms post testing for Covid have worsened with onset of tight bronchial coughing with some phlegm production.  She is not having any fevers.  Per review with [facility]/NP - pt is being set up for IV antivirals at the [facility] today.  Pt made aware of the above - is in agreement -and will await call from the [facility].  For the cough this RN advised pt to start with use of Robitussin DM ( or it's generic equivalent ).  Pt verbalized understanding.

## 2021-05-09 NOTE — Progress Notes (Signed)
I connected by phone with Melanie Holloway on 05/09/2021 at 9:40 AM to discuss the potential use of a new treatment for mild to moderate COVID-19 viral infection in non-hospitalized patients.  This patient is a 55 y.o. female that meets the FDA criteria for Emergency Use Authorization of COVID monoclonal antibody bebtelovimab. Has a (+) direct SARS-CoV-2 viral test result Has mild or moderate COVID-19  Is NOT hospitalized due to COVID-19 Is within 10 days of symptom onset Has at least one of the high risk factor(s) for progression to severe COVID-19 and/or hospitalization as defined in EUA. Specific high risk criteria : Immunosuppressive Disease or Treatment Sx onset 7/18  I have spoken and communicated the following to the patient or parent/caregiver regarding COVID monoclonal antibody treatment:  FDA has authorized the emergency use for the treatment of mild to moderate COVID-19 in adults and pediatric patients with positive results of direct SARS-CoV-2 viral testing who are 60 years of age and older weighing at least 40 kg, and who are at high risk for progressing to severe COVID-19 and/or hospitalization.  The significant known and potential risks and benefits of COVID monoclonal antibody, and the extent to which such potential risks and benefits are unknown.  Information on available alternative treatments and the risks and benefits of those alternatives, including clinical trials.  Patients treated with COVID monoclonal antibody should continue to self-isolate and use infection control measures (e.g., wear mask, isolate, social distance, avoid sharing personal items, clean and disinfect "high touch" surfaces, and frequent handwashing) according to CDC guidelines.   The patient or parent/caregiver has the option to accept or refuse COVID monoclonal antibody treatment.  Discussion about the monoclonal antibody infusion does not ensure treatment. The patient will be placed on a list and  scheduled according to risk, symptom onset and availability. A scheduler will reach to the patient to let them know if we can accommodate their infusion or not.  After reviewing this information with the patient, the patient has agreed to receive one of the available covid 19 monoclonal antibodies and will be provided an appropriate fact sheet prior to infusion. Scot Dock, NP 05/09/2021 9:40 AM

## 2021-05-12 ENCOUNTER — Encounter: Payer: Self-pay | Admitting: Adult Health

## 2021-05-13 ENCOUNTER — Inpatient Hospital Stay: Payer: No Typology Code available for payment source

## 2021-05-14 ENCOUNTER — Inpatient Hospital Stay: Payer: No Typology Code available for payment source

## 2021-05-14 ENCOUNTER — Telehealth: Payer: Self-pay | Admitting: *Deleted

## 2021-05-14 ENCOUNTER — Emergency Department: Payer: No Typology Code available for payment source

## 2021-05-14 ENCOUNTER — Inpatient Hospital Stay
Admission: EM | Admit: 2021-05-14 | Discharge: 2021-05-21 | DRG: 177 | Disposition: A | Discharge status: Home Health | Payer: No Typology Code available for payment source | Attending: Internal Medicine | Admitting: Internal Medicine

## 2021-05-14 DIAGNOSIS — Z803 Family history of malignant neoplasm of breast: Secondary | ICD-10-CM | POA: Diagnosis not present

## 2021-05-14 DIAGNOSIS — L930 Discoid lupus erythematosus: Secondary | ICD-10-CM | POA: Diagnosis present

## 2021-05-14 DIAGNOSIS — A419 Sepsis, unspecified organism: Principal | ICD-10-CM

## 2021-05-14 DIAGNOSIS — J1282 Pneumonia due to coronavirus disease 2019: Secondary | ICD-10-CM | POA: Diagnosis present

## 2021-05-14 DIAGNOSIS — N189 Chronic kidney disease, unspecified: Secondary | ICD-10-CM | POA: Diagnosis not present

## 2021-05-14 DIAGNOSIS — Z856 Personal history of leukemia: Secondary | ICD-10-CM | POA: Diagnosis not present

## 2021-05-14 DIAGNOSIS — I130 Hypertensive heart and chronic kidney disease with heart failure and stage 1 through stage 4 chronic kidney disease, or unspecified chronic kidney disease: Secondary | ICD-10-CM | POA: Diagnosis present

## 2021-05-14 DIAGNOSIS — A0839 Other viral enteritis: Secondary | ICD-10-CM | POA: Diagnosis present

## 2021-05-14 DIAGNOSIS — N2581 Secondary hyperparathyroidism of renal origin: Secondary | ICD-10-CM | POA: Diagnosis present

## 2021-05-14 DIAGNOSIS — Z923 Personal history of irradiation: Secondary | ICD-10-CM

## 2021-05-14 DIAGNOSIS — U071 COVID-19: Principal | ICD-10-CM | POA: Diagnosis present

## 2021-05-14 DIAGNOSIS — Z853 Personal history of malignant neoplasm of breast: Secondary | ICD-10-CM

## 2021-05-14 DIAGNOSIS — Z8249 Family history of ischemic heart disease and other diseases of the circulatory system: Secondary | ICD-10-CM | POA: Diagnosis not present

## 2021-05-14 DIAGNOSIS — Z833 Family history of diabetes mellitus: Secondary | ICD-10-CM | POA: Diagnosis not present

## 2021-05-14 DIAGNOSIS — N179 Acute kidney failure, unspecified: Secondary | ICD-10-CM | POA: Diagnosis not present

## 2021-05-14 DIAGNOSIS — I5022 Chronic systolic (congestive) heart failure: Secondary | ICD-10-CM | POA: Diagnosis present

## 2021-05-14 DIAGNOSIS — C7951 Secondary malignant neoplasm of bone: Secondary | ICD-10-CM | POA: Diagnosis present

## 2021-05-14 DIAGNOSIS — N184 Chronic kidney disease, stage 4 (severe): Secondary | ICD-10-CM | POA: Diagnosis present

## 2021-05-14 DIAGNOSIS — N170 Acute kidney failure with tubular necrosis: Secondary | ICD-10-CM | POA: Diagnosis present

## 2021-05-14 DIAGNOSIS — C50919 Malignant neoplasm of unspecified site of unspecified female breast: Secondary | ICD-10-CM

## 2021-05-14 DIAGNOSIS — E8729 Other acidosis: Secondary | ICD-10-CM

## 2021-05-14 DIAGNOSIS — T451X5A Adverse effect of antineoplastic and immunosuppressive drugs, initial encounter: Secondary | ICD-10-CM | POA: Diagnosis present

## 2021-05-14 DIAGNOSIS — Z79899 Other long term (current) drug therapy: Secondary | ICD-10-CM | POA: Diagnosis not present

## 2021-05-14 DIAGNOSIS — M3214 Glomerular disease in systemic lupus erythematosus: Secondary | ICD-10-CM | POA: Diagnosis present

## 2021-05-14 DIAGNOSIS — E860 Dehydration: Secondary | ICD-10-CM | POA: Diagnosis present

## 2021-05-14 DIAGNOSIS — D631 Anemia in chronic kidney disease: Secondary | ICD-10-CM | POA: Diagnosis present

## 2021-05-14 DIAGNOSIS — D61810 Antineoplastic chemotherapy induced pancytopenia: Secondary | ICD-10-CM | POA: Diagnosis present

## 2021-05-14 DIAGNOSIS — I10 Essential (primary) hypertension: Secondary | ICD-10-CM | POA: Diagnosis present

## 2021-05-14 DIAGNOSIS — E876 Hypokalemia: Secondary | ICD-10-CM | POA: Diagnosis present

## 2021-05-14 DIAGNOSIS — E872 Acidosis: Secondary | ICD-10-CM | POA: Diagnosis present

## 2021-05-14 DIAGNOSIS — C50912 Malignant neoplasm of unspecified site of left female breast: Secondary | ICD-10-CM

## 2021-05-14 DIAGNOSIS — D72819 Decreased white blood cell count, unspecified: Secondary | ICD-10-CM | POA: Diagnosis present

## 2021-05-14 DIAGNOSIS — Z9010 Acquired absence of unspecified breast and nipple: Secondary | ICD-10-CM | POA: Diagnosis not present

## 2021-05-14 DIAGNOSIS — R110 Nausea: Secondary | ICD-10-CM

## 2021-05-14 DIAGNOSIS — R6520 Severe sepsis without septic shock: Secondary | ICD-10-CM

## 2021-05-14 LAB — RESP PANEL BY RT-PCR (FLU A&B, COVID) ARPGX2
Influenza A by PCR: NEGATIVE
Influenza B by PCR: NEGATIVE
SARS Coronavirus 2 by RT PCR: POSITIVE — AB

## 2021-05-14 LAB — CBC WITH DIFFERENTIAL/PLATELET
Abs Immature Granulocytes: 0.02 10*3/uL (ref 0.00–0.07)
Basophils Absolute: 0 10*3/uL (ref 0.0–0.1)
Basophils Relative: 0 %
Eosinophils Absolute: 0 10*3/uL (ref 0.0–0.5)
Eosinophils Relative: 1 %
HCT: 22.9 % — ABNORMAL LOW (ref 36.0–46.0)
Hemoglobin: 8.2 g/dL — ABNORMAL LOW (ref 12.0–15.0)
Immature Granulocytes: 1 %
Lymphocytes Relative: 23 %
Lymphs Abs: 0.5 10*3/uL — ABNORMAL LOW (ref 0.7–4.0)
MCH: 27.2 pg (ref 26.0–34.0)
MCHC: 35.8 g/dL (ref 30.0–36.0)
MCV: 75.8 fL — ABNORMAL LOW (ref 80.0–100.0)
Monocytes Absolute: 0.2 10*3/uL (ref 0.1–1.0)
Monocytes Relative: 7 %
Neutro Abs: 1.6 10*3/uL — ABNORMAL LOW (ref 1.7–7.7)
Neutrophils Relative %: 68 %
Platelets: 66 10*3/uL — ABNORMAL LOW (ref 150–400)
RBC: 3.02 MIL/uL — ABNORMAL LOW (ref 3.87–5.11)
RDW: 16.5 % — ABNORMAL HIGH (ref 11.5–15.5)
Smear Review: DECREASED
WBC: 2.3 10*3/uL — ABNORMAL LOW (ref 4.0–10.5)
nRBC: 0 % (ref 0.0–0.2)

## 2021-05-14 LAB — PROTIME-INR
INR: 1.5 — ABNORMAL HIGH (ref 0.8–1.2)
Prothrombin Time: 17.9 seconds — ABNORMAL HIGH (ref 11.4–15.2)

## 2021-05-14 LAB — COMPREHENSIVE METABOLIC PANEL
ALT: 11 U/L (ref 0–44)
AST: 27 U/L (ref 15–41)
Albumin: 3.7 g/dL (ref 3.5–5.0)
Alkaline Phosphatase: 94 U/L (ref 38–126)
Anion gap: 18 — ABNORMAL HIGH (ref 5–15)
BUN: 103 mg/dL — ABNORMAL HIGH (ref 6–20)
CO2: 14 mmol/L — ABNORMAL LOW (ref 22–32)
Calcium: 7.8 mg/dL — ABNORMAL LOW (ref 8.9–10.3)
Chloride: 102 mmol/L (ref 98–111)
Creatinine, Ser: 3.52 mg/dL — ABNORMAL HIGH (ref 0.44–1.00)
GFR, Estimated: 15 mL/min — ABNORMAL LOW (ref >60)
Glucose, Bld: 108 mg/dL — ABNORMAL HIGH (ref 70–99)
Potassium: 2.7 mmol/L — CL (ref 3.5–5.1)
Sodium: 134 mmol/L — ABNORMAL LOW (ref 135–145)
Total Bilirubin: 1 mg/dL (ref 0.3–1.2)
Total Protein: 7.2 g/dL (ref 6.5–8.1)

## 2021-05-14 LAB — LACTIC ACID, PLASMA
Lactic Acid, Venous: 1.1 mmol/L (ref 0.5–1.9)
Lactic Acid, Venous: 1.1 mmol/L (ref 0.5–1.9)

## 2021-05-14 LAB — MAGNESIUM: Magnesium: 1.3 mg/dL — ABNORMAL LOW (ref 1.7–2.4)

## 2021-05-14 LAB — TROPONIN I (HIGH SENSITIVITY)
Troponin I (High Sensitivity): 41 ng/L — ABNORMAL HIGH (ref <18)
Troponin I (High Sensitivity): 36 ng/L — ABNORMAL HIGH (ref <18)

## 2021-05-14 LAB — APTT: aPTT: 28 seconds (ref 24–36)

## 2021-05-14 LAB — BRAIN NATRIURETIC PEPTIDE: B Natriuretic Peptide: 17 pg/mL (ref 0.0–100.0)

## 2021-05-14 LAB — PROCALCITONIN: Procalcitonin: 0.31 ng/mL

## 2021-05-14 MED ORDER — SODIUM CHLORIDE 0.9 % IV SOLN
500.0000 mg | INTRAVENOUS | Status: DC
  Administered 2021-05-14: 500 mg via INTRAVENOUS
  Filled 2021-05-14 (×2): qty 500

## 2021-05-14 MED ORDER — GUAIFENESIN-DM 100-10 MG/5ML PO SYRP: 10.0000 mL | ORAL_SOLUTION | ORAL | Status: DC | PRN

## 2021-05-14 MED ORDER — ASCORBIC ACID 500 MG PO TABS
500.0000 mg | ORAL_TABLET | Freq: Every day | ORAL | Status: DC
  Administered 2021-05-14 – 2021-05-21 (×8): 500 mg via ORAL
  Filled 2021-05-14 (×9): qty 1

## 2021-05-14 MED ORDER — ONDANSETRON HCL 4 MG PO TABS
4.0000 mg | ORAL_TABLET | Freq: Four times a day (QID) | ORAL | Status: DC | PRN
  Administered 2021-05-17 (×2): 4 mg via ORAL
  Filled 2021-05-14 (×3): qty 1

## 2021-05-14 MED ORDER — ONDANSETRON HCL 4 MG/2ML IJ SOLN
4.0000 mg | Freq: Four times a day (QID) | INTRAMUSCULAR | Status: DC | PRN
  Administered 2021-05-15 – 2021-05-16 (×2): 4 mg via INTRAVENOUS
  Filled 2021-05-14 (×3): qty 2

## 2021-05-14 MED ORDER — POTASSIUM CHLORIDE IN NACL 20-0.9 MEQ/L-% IV SOLN
Freq: Once | INTRAVENOUS | Status: AC
  Filled 2021-05-14: qty 1000

## 2021-05-14 MED ORDER — MAGNESIUM SULFATE IN D5W 1-5 GM/100ML-% IV SOLN
1.0000 g | Freq: Once | INTRAVENOUS | Status: AC
  Administered 2021-05-14: 1 g via INTRAVENOUS
  Filled 2021-05-14: qty 100

## 2021-05-14 MED ORDER — SODIUM CHLORIDE 0.9 % IV SOLN
2.0000 g | INTRAVENOUS | Status: DC
  Administered 2021-05-14: 2 g via INTRAVENOUS
  Filled 2021-05-14 (×2): qty 20

## 2021-05-14 MED ORDER — SODIUM CHLORIDE 0.9 % IV SOLN
100.0000 mg | Freq: Every day | INTRAVENOUS | Status: AC
  Administered 2021-05-15 – 2021-05-16 (×2): 100 mg via INTRAVENOUS
  Filled 2021-05-14 (×2): qty 100
  Filled 2021-05-14 (×2): qty 20

## 2021-05-14 MED ORDER — HYDROCOD POLST-CPM POLST ER 10-8 MG/5ML PO SUER
5.0000 mL | Freq: Two times a day (BID) | ORAL | Status: DC | PRN
  Administered 2021-05-15: 5 mL via ORAL
  Filled 2021-05-14: qty 5

## 2021-05-14 MED ORDER — POTASSIUM CHLORIDE 10 MEQ/100ML IV SOLN
10.0000 meq | INTRAVENOUS | Status: AC
Start: 2021-05-14 — End: 2021-05-15
  Administered 2021-05-14 (×3): 10 meq via INTRAVENOUS
  Filled 2021-05-14 (×3): qty 100

## 2021-05-14 MED ORDER — SODIUM CHLORIDE 0.9 % IV SOLN
100.0000 mg | Freq: Once | INTRAVENOUS | Status: AC
  Administered 2021-05-15: 100 mg via INTRAVENOUS
  Filled 2021-05-14: qty 100

## 2021-05-14 MED ORDER — ZINC SULFATE 220 (50 ZN) MG PO CAPS
220.0000 mg | ORAL_CAPSULE | Freq: Every day | ORAL | Status: DC
  Administered 2021-05-14 – 2021-05-21 (×8): 220 mg via ORAL
  Filled 2021-05-14 (×8): qty 1

## 2021-05-14 MED ORDER — ALBUTEROL SULFATE HFA 108 (90 BASE) MCG/ACT IN AERS
2.0000 | INHALATION_SPRAY | Freq: Four times a day (QID) | RESPIRATORY_TRACT | Status: DC
  Administered 2021-05-15 – 2021-05-21 (×26): 2 via RESPIRATORY_TRACT
  Filled 2021-05-14: qty 6.7

## 2021-05-14 MED ORDER — SODIUM CHLORIDE 0.9 % IV SOLN
100.0000 mg | Freq: Every day | INTRAVENOUS | Status: DC
  Filled 2021-05-14: qty 20

## 2021-05-14 MED ORDER — SODIUM CHLORIDE 0.9 % IV SOLN
100.0000 mg | Freq: Once | INTRAVENOUS | Status: AC
  Administered 2021-05-14: 100 mg via INTRAVENOUS
  Filled 2021-05-14: qty 100

## 2021-05-14 MED ORDER — LACTATED RINGERS IV SOLN: INTRAVENOUS | Status: AC

## 2021-05-14 MED ORDER — SENNOSIDES-DOCUSATE SODIUM 8.6-50 MG PO TABS: 1.0000 | ORAL_TABLET | Freq: Every evening | ORAL | Status: DC | PRN

## 2021-05-14 MED ORDER — SODIUM CHLORIDE 0.9 % IV SOLN
200.0000 mg | Freq: Once | INTRAVENOUS | Status: DC
  Filled 2021-05-14: qty 40

## 2021-05-14 MED ORDER — SODIUM CHLORIDE 0.9 % IV BOLUS
500.0000 mL | Freq: Once | INTRAVENOUS | Status: AC
  Administered 2021-05-14: 500 mL via INTRAVENOUS

## 2021-05-14 MED ORDER — ENOXAPARIN SODIUM 30 MG/0.3ML IJ SOSY
30.0000 mg | PREFILLED_SYRINGE | INTRAMUSCULAR | Status: DC
  Administered 2021-05-14: 30 mg via SUBCUTANEOUS
  Filled 2021-05-14 (×4): qty 0.3

## 2021-05-14 MED ORDER — ACETAMINOPHEN 325 MG PO TABS: 650.0000 mg | ORAL_TABLET | Freq: Four times a day (QID) | ORAL | Status: DC | PRN

## 2021-05-14 NOTE — ED Notes (Signed)
Lab at bedside

## 2021-05-14 NOTE — Consult Note (Signed)
CODE SEPSIS - PHARMACY COMMUNICATION  **Broad Spectrum Antibiotics should be administered within 1 hour of Sepsis diagnosis**  Time Code Sepsis Called/Page Received: 1905  Antibiotics Ordered: 1905  Time of 1st antibiotic administration: 1952  Additional action taken by pharmacy: N/A  If necessary, Name of Provider/Nurse Contacted: N/A    Darnelle Bos ,PharmD Clinical Pharmacist  05/14/2021  7:10 PM
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
## 2021-05-14 NOTE — ED Notes (Signed)
Patient transported to CT 

## 2021-05-14 NOTE — ED Notes (Signed)
Called Lab to obtain repeat Troponin and Lactate.

## 2021-05-14 NOTE — Telephone Encounter (Signed)
This RN spoke with pt per her call stating she is having severe fatigue " and think I need a blood transfusion".  She states she is still recovering post positive Covid test last Tuesday and administration of IV antivirals given on Friday.  She states her breathing " is not so good " and she fell twice yesterday.  Cough is manageable.  She states she is drinking 1 boost daily and " always drinking water ".  Her main issue is overall severe fatigue.  This RN informed pt of need to arrange above due to her recent positive Covid test and how to best provide her care.

## 2021-05-14 NOTE — Sepsis Progress Note (Signed)
Following for sepsis monitoring ?
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
## 2021-05-14 NOTE — Telephone Encounter (Signed)
Per MD review of pt's complaints- and concern for other health issues besides just needing a blood transfusion- recommended pt to proceed to the ER .  Note pt lives in [place] and would go [facility] ER.  Pt stated understanding and reason and stated she would proceed to the ER.  This RN called the ER at [facility] - and gave report to Triage intake nurse- Amber per patient's pending arrival.

## 2021-05-14 NOTE — ED Notes (Signed)
Pt to Xray.

## 2021-05-14 NOTE — ED Triage Notes (Signed)
Pt to ED POV for shob for a few weeks. RR unlabored. Alert and oriented.  Denies known covid exposure

## 2021-05-15 ENCOUNTER — Encounter: Payer: Self-pay | Admitting: Internal Medicine

## 2021-05-15 ENCOUNTER — Encounter: Payer: Self-pay | Admitting: Adult Health

## 2021-05-15 DIAGNOSIS — N184 Chronic kidney disease, stage 4 (severe): Secondary | ICD-10-CM | POA: Diagnosis not present

## 2021-05-15 DIAGNOSIS — N179 Acute kidney failure, unspecified: Secondary | ICD-10-CM

## 2021-05-15 DIAGNOSIS — N189 Chronic kidney disease, unspecified: Secondary | ICD-10-CM

## 2021-05-15 DIAGNOSIS — I5022 Chronic systolic (congestive) heart failure: Secondary | ICD-10-CM | POA: Diagnosis not present

## 2021-05-15 DIAGNOSIS — D631 Anemia in chronic kidney disease: Secondary | ICD-10-CM

## 2021-05-15 DIAGNOSIS — U071 COVID-19: Secondary | ICD-10-CM | POA: Diagnosis not present

## 2021-05-15 LAB — COMPREHENSIVE METABOLIC PANEL
ALT: 11 U/L (ref 0–44)
AST: 30 U/L (ref 15–41)
Albumin: 3.4 g/dL — ABNORMAL LOW (ref 3.5–5.0)
Alkaline Phosphatase: 89 U/L (ref 38–126)
Anion gap: 14 (ref 5–15)
BUN: 98 mg/dL — ABNORMAL HIGH (ref 6–20)
CO2: 14 mmol/L — ABNORMAL LOW (ref 22–32)
Calcium: 7.8 mg/dL — ABNORMAL LOW (ref 8.9–10.3)
Chloride: 111 mmol/L (ref 98–111)
Creatinine, Ser: 2.51 mg/dL — ABNORMAL HIGH (ref 0.44–1.00)
GFR, Estimated: 22 mL/min — ABNORMAL LOW (ref >60)
Glucose, Bld: 109 mg/dL — ABNORMAL HIGH (ref 70–99)
Potassium: 3.1 mmol/L — ABNORMAL LOW (ref 3.5–5.1)
Sodium: 139 mmol/L (ref 135–145)
Total Bilirubin: 0.8 mg/dL (ref 0.3–1.2)
Total Protein: 6.9 g/dL (ref 6.5–8.1)

## 2021-05-15 LAB — CBC WITH DIFFERENTIAL/PLATELET
Abs Immature Granulocytes: 0.02 10*3/uL (ref 0.00–0.07)
Basophils Absolute: 0 10*3/uL (ref 0.0–0.1)
Basophils Relative: 0 %
Eosinophils Absolute: 0 10*3/uL (ref 0.0–0.5)
Eosinophils Relative: 1 %
HCT: 23.7 % — ABNORMAL LOW (ref 36.0–46.0)
Hemoglobin: 8.3 g/dL — ABNORMAL LOW (ref 12.0–15.0)
Immature Granulocytes: 1 %
Lymphocytes Relative: 31 %
Lymphs Abs: 0.7 10*3/uL (ref 0.7–4.0)
MCH: 27.4 pg (ref 26.0–34.0)
MCHC: 35 g/dL (ref 30.0–36.0)
MCV: 78.2 fL — ABNORMAL LOW (ref 80.0–100.0)
Monocytes Absolute: 0.1 10*3/uL (ref 0.1–1.0)
Monocytes Relative: 4 %
Neutro Abs: 1.5 10*3/uL — ABNORMAL LOW (ref 1.7–7.7)
Neutrophils Relative %: 63 %
Platelets: 69 10*3/uL — ABNORMAL LOW (ref 150–400)
RBC: 3.03 MIL/uL — ABNORMAL LOW (ref 3.87–5.11)
RDW: 16.8 % — ABNORMAL HIGH (ref 11.5–15.5)
WBC: 2.4 10*3/uL — ABNORMAL LOW (ref 4.0–10.5)
nRBC: 0 % (ref 0.0–0.2)

## 2021-05-15 LAB — D-DIMER, QUANTITATIVE: D-Dimer, Quant: 8.73 ug/mL-FEU — ABNORMAL HIGH (ref 0.00–0.50)

## 2021-05-15 LAB — C-REACTIVE PROTEIN: CRP: 1.4 mg/dL — ABNORMAL HIGH (ref <1.0)

## 2021-05-15 LAB — HIV ANTIBODY (ROUTINE TESTING W REFLEX): HIV Screen 4th Generation wRfx: NONREACTIVE

## 2021-05-15 MED ORDER — SODIUM CHLORIDE 0.9 % IV SOLN
12.5000 mg | Freq: Four times a day (QID) | INTRAVENOUS | Status: DC | PRN
  Filled 2021-05-15 (×2): qty 0.5

## 2021-05-15 MED ORDER — MYCOPHENOLATE MOFETIL 250 MG PO CAPS
500.0000 mg | ORAL_CAPSULE | Freq: Two times a day (BID) | ORAL | Status: DC
  Administered 2021-05-15 – 2021-05-21 (×13): 500 mg via ORAL
  Filled 2021-05-15 (×14): qty 2

## 2021-05-15 MED ORDER — POTASSIUM CHLORIDE 10 MEQ/100ML IV SOLN
10.0000 meq | INTRAVENOUS | Status: AC
  Administered 2021-05-15 (×4): 10 meq via INTRAVENOUS
  Filled 2021-05-15 (×4): qty 100

## 2021-05-15 MED ORDER — HYDROXYCHLOROQUINE SULFATE 200 MG PO TABS
200.0000 mg | ORAL_TABLET | Freq: Every day | ORAL | Status: DC
  Administered 2021-05-15 – 2021-05-21 (×7): 200 mg via ORAL
  Filled 2021-05-15 (×7): qty 1

## 2021-05-15 MED ORDER — METOPROLOL SUCCINATE ER 50 MG PO TB24
50.0000 mg | ORAL_TABLET | Freq: Every day | ORAL | Status: DC
  Administered 2021-05-15 – 2021-05-16 (×2): 50 mg via ORAL
  Filled 2021-05-15 (×2): qty 1

## 2021-05-15 NOTE — ED Notes (Signed)
Lab at bedside

## 2021-05-15 NOTE — ED Notes (Signed)
Phlebotomy unable to obtain morning labs. MD [place] notified.

## 2021-05-15 NOTE — Progress Notes (Signed)
Lab unable to draw blood, 3 different people tried, unsuccessful,MD made aware

## 2021-05-15 NOTE — ED Notes (Signed)
Pt ambulatory to bathroom with assistance.

## 2021-05-15 NOTE — Progress Notes (Signed)
[facility] [place] NAME: Melanie Holloway    MR#:  [id number]  PCP: Ria Bush, MD  DATE OF BIRTH:  09-28-1966  SUBJECTIVE:  CHIEF COMPLAINT:   Chief Complaint  Patient presents with  . Shortness of Breath  Complains of diarrhea (loose watery stool) ongoing for last 2 weeks associated with cough.  Feeling short of breath.  Requesting blood transfusion if indicated as she normally gets same once a month at cancer center/with her oncologist. REVIEW OF SYSTEMS:  Review of Systems  Constitutional:  Positive for malaise/fatigue. Negative for diaphoresis, fever and weight loss.  HENT:  Negative for ear discharge, ear pain, hearing loss, nosebleeds, sore throat and tinnitus.   Eyes:  Negative for blurred vision and pain.  Respiratory:  Positive for cough and shortness of breath. Negative for hemoptysis and wheezing.   Cardiovascular:  Negative for chest pain, palpitations, orthopnea and leg swelling.  Gastrointestinal:  Positive for diarrhea, nausea and vomiting. Negative for abdominal pain, blood in stool, constipation and heartburn.  Genitourinary:  Negative for dysuria, frequency and urgency.  Musculoskeletal:  Negative for back pain and myalgias.  Skin:  Negative for itching and rash.  Neurological:  Negative for dizziness, tingling, tremors, focal weakness, seizures, weakness and headaches.  Psychiatric/Behavioral:  Negative for depression. The patient is not nervous/anxious.   DRUG ALLERGIES:  No Known Allergies VITALS:  Blood pressure 131/73, pulse 99, temperature 97.8 F (36.6 C), resp. rate 15, height $RemoveBe'4\' 11"'TIZOkiqnr$  (1.499 m), weight 58.1 kg, last menstrual period 03/04/2009, SpO2 100 %. PHYSICAL EXAMINATION:  Physical Exam 55 year old chronically ill looking female lying in the bed in some distress Eyes pupil equal round reactive to light accommodation.  No scleral icterus.  Scleral pallor present Lungs decreased breath sounds at the bases.  No  wheezing rales Cardiovascular S1-S2 normal, no murmur rales or gallop Abdomen soft, benign Neurologically: Awake and alert, nonfocal Skin: No rash or lesion Psychiatry flat affect LABORATORY PANEL:  Female CBC Recent Labs  Lab 05/15/21 1347  WBC 2.4*  HGB 8.3*  HCT 23.7*  PLT 69*   ------------------------------------------------------------------------------------------------------------------ Chemistries  Recent Labs  Lab 05/14/21 1535 05/15/21 1347  NA 134* 139  K 2.7* 3.1*  CL 102 111  CO2 14* 14*  GLUCOSE 108* 109*  BUN 103* 98*  CREATININE 3.52* 2.51*  CALCIUM 7.8* 7.8*  MG 1.3*  --   AST 27 30  ALT 11 11  ALKPHOS 94 89  BILITOT 1.0 0.8   MEDICATIONS:  Scheduled Meds: . albuterol  2 puff Inhalation Q6H  . vitamin C  500 mg Oral Daily  . enoxaparin (LOVENOX) injection  30 mg Subcutaneous Q24H  . zinc sulfate  220 mg Oral Daily   Continuous Infusions: . azithromycin Stopped (05/14/21 2118)  . cefTRIAXone (ROCEPHIN)  IV Stopped (05/14/21 2034)  . lactated ringers 150 mL/hr at 05/15/21 0912  . remdesivir 100 mg in NS 100 mL 100 mg (05/15/21 1119)   RADIOLOGY:  DG Chest 2 View  Result Date: 05/14/2021 CLINICAL DATA:  Shortness of breath EXAM: CHEST - 2 VIEW COMPARISON:  03/03/2018 FINDINGS: Heart size within normal limits. No pulmonary vascular congestion. Right hemidiaphragm is mildly elevated. Lungs are clear. There is atypical mottled appearance of the right proximal humerus which is suspicious for marrow replacement process. IMPRESSION: 1. No acute cardiopulmonary process. 2. Atypical mottled appearance of the right proximal humerus is suspicious for marrow replacement process such is multiple myeloma,  myelofibrosis, or metastatic disease. Electronically Signed   By: Miachel Roux M.D.   On: 05/14/2021 16:22   CT CHEST ABDOMEN PELVIS WO CONTRAST  Result Date: 05/14/2021 CLINICAL DATA:  55 year old female with nausea and vomiting. History breast cancer with  bone metastases. EXAM: CT CHEST, ABDOMEN AND PELVIS WITHOUT CONTRAST TECHNIQUE: Multidetector CT imaging of the chest, abdomen and pelvis was performed following the standard protocol without IV contrast. COMPARISON:  Chest radiograph dated 05/14/2021 and CT abdomen pelvis dated 11/19/2020. FINDINGS: Evaluation of this exam is limited in the absence of intravenous contrast. CT CHEST FINDINGS Cardiovascular: There is no cardiomegaly. Trace pericardial effusion. There is coronary vascular calcification. There is mild atherosclerotic calcification the thoracic aorta. No aneurysmal dilatation. The central pulmonary arteries are grossly unremarkable on this noncontrast CT. Mediastinum/Nodes: No hilar or mediastinal adenopathy. Evaluation however is limited in the absence of intravenous contrast. The esophagus and the thyroid gland are grossly unremarkable. No mediastinal fluid collection. Lungs/Pleura: Scattered bilateral lower lobe predominant areas of ground-glass nodularity most concerning for pneumonia, likely atypical in etiology. Metastatic disease is less likely but not excluded clinical correlation and follow-up to resolution recommended. Areas of subpleural scarring noted at the lung bases and lingula. No lobar consolidation, pleural effusion, or pneumothorax. The central airways are patent. Musculoskeletal: There is severe diffuse osseous metastatic disease. Old right rib fracture. No acute osseous pathology. CT ABDOMEN PELVIS FINDINGS No intra-abdominal free air or free fluid. Hepatobiliary: Ill-defined area of hypodensity involving the left lobe of the liver appears progressed since the prior CT. This is not evaluated and although may represent an area of fatty infiltration, metastatic disease is not excluded clinical correlation is recommended. The gallbladder is filled with stones. There is diffuse thickened appearance of the gallbladder wall which may be chronic. No pericholecystic fluid. Ultrasound may  provide better evaluation of the gallbladder if there is high clinical concern for acute cholecystitis. Pancreas: Unremarkable. No pancreatic ductal dilatation or surrounding inflammatory changes. Spleen: Normal in size without focal abnormality. Adrenals/Urinary Tract: The adrenal glands are unremarkable. The kidneys, visualized ureters, and urinary bladder appear unremarkable. Stomach/Bowel: There is a small hiatal hernia. Several normal caliber fluid-filled loops of small bowel may be physiologic or represent enteritis. Clinical correlation is recommended. Loose stool within the colon compatible with diarrheal state. Correlation with clinical exam and stool cultures recommended. There is mild thickened appearance of the colon, likely related to underdistention. Mild colitis is less likely. There is no bowel obstruction. The appendix is normal. Vascular/Lymphatic: Mild aortoiliac atherosclerotic disease. The IVC is unremarkable. No portal venous gas. There is no adenopathy. Reproductive: Small calcified uterine fibroids. No adnexal masses. Other: None Musculoskeletal: Severe osseous metastatic disease relatively similar to prior CT. No acute osseous pathology. IMPRESSION: 1. Bilateral lower lobe predominant areas of ground-glass nodularity most concerning for pneumonia, likely atypical in etiology. Clinical correlation and follow-up to resolution recommended. 2. Diarrheal state with possible enteritis. Correlation with clinical exam and stool cultures recommended. No bowel obstruction. Normal appendix. 3. Underdistention of the colon versus less likely mild colitis. Clinical correlation is recommended. 4. Cholelithiasis. 5. Severe osseous metastatic disease relatively similar to prior CT. 6. Ill-defined area of hypodensity involving the left lobe of the liver appears progressed since the prior CT. 7. Aortic Atherosclerosis (ICD10-I70.0). Electronically Signed   By: Anner Crete M.D.   On: 05/14/2021 18:43    ASSESSMENT AND PLAN:  55 year old female with a known history of CKD stage IV with lupus nephritis, anemia of chronic kidney  disease, metastatic breast cancer, HFrEF with improved EF admitted for pneumonia due to COVID-19 virus  Principal Problem:   Pneumonia due to COVID-19 virus Active Problems:   Essential hypertension   Metastatic breast cancer (HCC)   Anemia associated with stage 4 chronic renal failure (HCC)   Lupus erythematosus tumidus   Leukopenia   Hypokalemia due to excessive gastrointestinal loss of potassium   Chronic systolic CHF (congestive heart failure) (HCC)   Acute kidney injury superimposed on CKD IV (HCC)   Gastroenteritis due to COVID-19 virus   Increased anion gap metabolic acidosis  Pneumonia and gastroenteritis due to COVID-19 virus -POA -Continue IV remdesivir day 2/5, albuterol, antitussives and vitamins -She is saturating 100% on room air and not wheezing so we will hold off on steroids Monitor inflammatory markers -Her procalcitonin is 0.31.  I will stop Rocephin and Zithromax  Severe hypokalemia Replete and recheck.  Pharmacy managing this  Acute kidney injury superimposed on CKD stage IV Anion gap metabolic acidosis -Due to ongoing volume loss from vomiting, diarrhea and poor p.o. intake -Improving with hydration  Essential hypertension She was initially hypotensive but blood pressure improved with hydration.  We will resume her home dose of Toprol at 50 mg once daily  Lupus nephritis -Continue CellCept and Plaquenil  Pancytopenia Likely due to chemo.  Anemia can also be due to underlying CKD Patient normally receives blood transfusion once a month with her oncology/at cancer center Her hemoglobin is 8.3 so we will hold off transfusion at this time.  We will have type and cross done if she needs transfusion as she is a very difficult stick.  HFrEF with improved EF - Not acutely exacerbated - EF now 70% 01/2020.  Previously 25 to 30% -  Monitor for exacerbation given mild fluid resuscitation - Holding Demadex and Entresto -Resume metoprolol   Body mass index is 25.85 kg/m.  Net IO Since Admission: 100 mL [05/15/21 1451]   LOS: 1 day   Consultants: None    Antibiotics: Given dose of Rocephin and Zithromax in the ED and discontinued on 05/15/2021  Status is: Inpatient  Remains inpatient appropriate because:Persistent severe electrolyte disturbances, ongoing vomiting, nausea, diarrhea and pulmonary symptoms  Dispo: The patient is from: Home              Anticipated d/c is to: Home              Patient currently is not medically stable to d/c.   Difficult to place patient No    DVT prophylaxis:       enoxaparin (LOVENOX) injection 30 mg Start: 05/14/21 2200     Family Communication:  "discussed with patient"   All the records are reviewed and case discussed with Nursing and TOC team. Management plans discussed with the patient, nursing and they are in agreement.  CODE STATUS: Full Code Level of care: Med-Surg  TOTAL TIME TAKING CARE OF THIS PATIENT: 35 minutes.   More than 50% of the time was spent in counseling/coordination of care: YES  POSSIBLE D/C IN 3-4 DAYS, DEPENDING ON CLINICAL CONDITION.   Max Sane M.D on 05/15/2021 at 2:51 PM  [facility]   CC: Primary care physician; Ria Bush, MD  Note: This dictation was prepared with Dragon dictation along with smaller phrase technology. Any transcriptional errors that result from this process are unintentional.

## 2021-05-15 NOTE — ED Notes (Signed)
This RN assisted the patient to and from the bedside commode using her personal walker. A hat collection container placed on toilet to collect a urine sample but unfortunately no urine was caught in it. Pt still refusing in and out cath.

## 2021-05-16 DIAGNOSIS — U071 COVID-19: Secondary | ICD-10-CM | POA: Diagnosis not present

## 2021-05-16 DIAGNOSIS — N184 Chronic kidney disease, stage 4 (severe): Secondary | ICD-10-CM | POA: Diagnosis not present

## 2021-05-16 DIAGNOSIS — N179 Acute kidney failure, unspecified: Secondary | ICD-10-CM | POA: Diagnosis not present

## 2021-05-16 DIAGNOSIS — I5022 Chronic systolic (congestive) heart failure: Secondary | ICD-10-CM | POA: Diagnosis not present

## 2021-05-16 LAB — BLOOD CULTURE ID PANEL (REFLEXED) - BCID2

## 2021-05-16 LAB — CBC WITH DIFFERENTIAL/PLATELET
Abs Immature Granulocytes: 0.02 10*3/uL (ref 0.00–0.07)
Basophils Absolute: 0 10*3/uL (ref 0.0–0.1)
Basophils Relative: 0 %
Eosinophils Absolute: 0 10*3/uL (ref 0.0–0.5)
Eosinophils Relative: 0 %
HCT: 22.8 % — ABNORMAL LOW (ref 36.0–46.0)
Hemoglobin: 8 g/dL — ABNORMAL LOW (ref 12.0–15.0)
Immature Granulocytes: 1 %
Lymphocytes Relative: 31 %
Lymphs Abs: 0.7 10*3/uL (ref 0.7–4.0)
MCH: 26.6 pg (ref 26.0–34.0)
MCHC: 35.1 g/dL (ref 30.0–36.0)
MCV: 75.7 fL — ABNORMAL LOW (ref 80.0–100.0)
Monocytes Absolute: 0.2 10*3/uL (ref 0.1–1.0)
Monocytes Relative: 7 %
Neutro Abs: 1.4 10*3/uL — ABNORMAL LOW (ref 1.7–7.7)
Neutrophils Relative %: 61 %
Platelets: 71 10*3/uL — ABNORMAL LOW (ref 150–400)
RBC: 3.01 MIL/uL — ABNORMAL LOW (ref 3.87–5.11)
RDW: 17 % — ABNORMAL HIGH (ref 11.5–15.5)
Smear Review: UNDETERMINED
WBC: 2.3 10*3/uL — ABNORMAL LOW (ref 4.0–10.5)
nRBC: 0 % (ref 0.0–0.2)

## 2021-05-16 LAB — HEMOGLOBIN AND HEMATOCRIT, BLOOD
HCT: 24.8 % — ABNORMAL LOW (ref 36.0–46.0)
Hemoglobin: 8.9 g/dL — ABNORMAL LOW (ref 12.0–15.0)

## 2021-05-16 LAB — COMPREHENSIVE METABOLIC PANEL
ALT: 11 U/L (ref 0–44)
AST: 25 U/L (ref 15–41)
Albumin: 3.2 g/dL — ABNORMAL LOW (ref 3.5–5.0)
Alkaline Phosphatase: 86 U/L (ref 38–126)
Anion gap: 15 (ref 5–15)
BUN: 97 mg/dL — ABNORMAL HIGH (ref 6–20)
CO2: 14 mmol/L — ABNORMAL LOW (ref 22–32)
Calcium: 8.4 mg/dL — ABNORMAL LOW (ref 8.9–10.3)
Chloride: 114 mmol/L — ABNORMAL HIGH (ref 98–111)
Creatinine, Ser: 2.59 mg/dL — ABNORMAL HIGH (ref 0.44–1.00)
GFR, Estimated: 21 mL/min — ABNORMAL LOW (ref >60)
Glucose, Bld: 92 mg/dL (ref 70–99)
Potassium: 3.5 mmol/L (ref 3.5–5.1)
Sodium: 143 mmol/L (ref 135–145)
Total Bilirubin: 0.7 mg/dL (ref 0.3–1.2)
Total Protein: 6.5 g/dL (ref 6.5–8.1)

## 2021-05-16 LAB — C-REACTIVE PROTEIN: CRP: 1.7 mg/dL — ABNORMAL HIGH (ref <1.0)

## 2021-05-16 LAB — D-DIMER, QUANTITATIVE: D-Dimer, Quant: 11.71 ug/mL-FEU — ABNORMAL HIGH (ref 0.00–0.50)

## 2021-05-16 LAB — PREPARE RBC (CROSSMATCH)

## 2021-05-16 MED ORDER — ALUM & MAG HYDROXIDE-SIMETH 200-200-20 MG/5ML PO SUSP
30.0000 mL | ORAL | Status: DC | PRN
  Administered 2021-05-16: 21:00:00 30 mL via ORAL
  Filled 2021-05-16 (×2): qty 30

## 2021-05-16 MED ORDER — SODIUM CHLORIDE 0.9 % IV SOLN
INTRAVENOUS | Status: DC | PRN
  Administered 2021-05-16: 10:00:00 250 mL via INTRAVENOUS

## 2021-05-16 MED ORDER — METOPROLOL SUCCINATE ER 25 MG PO TB24
25.0000 mg | ORAL_TABLET | Freq: Every day | ORAL | Status: DC
  Administered 2021-05-17 – 2021-05-21 (×5): 25 mg via ORAL
  Filled 2021-05-16 (×5): qty 1

## 2021-05-16 MED ORDER — SODIUM CHLORIDE 0.9% IV SOLUTION: Freq: Once | INTRAVENOUS | Status: AC

## 2021-05-16 NOTE — Progress Notes (Addendum)
[facility] [place] NAME: Melanie Holloway    MR#:  [id number]  PCP: Ria Bush, MD  DATE OF BIRTH:  [birth year]  SUBJECTIVE:  CHIEF COMPLAINT:   Chief Complaint  Patient presents with  . Shortness of Breath  Requesting blood transfusion today.  She is feeling somewhat better with less cough and well rested as she was able to sleep much better last night. REVIEW OF SYSTEMS:  Review of Systems  Constitutional:  Positive for malaise/fatigue. Negative for diaphoresis, fever and weight loss.  HENT:  Negative for ear discharge, ear pain, hearing loss, nosebleeds, sore throat and tinnitus.   Eyes:  Negative for blurred vision and pain.  Respiratory:  Positive for cough and shortness of breath. Negative for hemoptysis and wheezing.   Cardiovascular:  Negative for chest pain, palpitations, orthopnea and leg swelling.  Gastrointestinal:  Positive for diarrhea. Negative for abdominal pain, blood in stool, constipation and heartburn.  Genitourinary:  Negative for dysuria, frequency and urgency.  Musculoskeletal:  Negative for back pain and myalgias.  Skin:  Negative for itching and rash.  Neurological:  Negative for dizziness, tingling, tremors, focal weakness, seizures, weakness and headaches.  Psychiatric/Behavioral:  Negative for depression. The patient is not nervous/anxious.   DRUG ALLERGIES:  No Known Allergies VITALS:  Blood pressure 126/77, pulse 74, temperature 97.6 F (36.4 C), resp. rate 18, height 4\' 11"  (1.499 m), weight 58.1 kg, last menstrual period 03/04/2009, SpO2 100 %. PHYSICAL EXAMINATION:  Physical Exam 55 year old chronically ill looking female lying in the bed in no acute distress Eyes pupil equal round reactive to light accommodation.  No scleral icterus.  Scleral pallor present Lungs decreased breath sounds at the bases.  No wheezing rales Cardiovascular S1-S2 normal, no murmur rales or gallop Abdomen soft,  benign Neurologically: Awake and alert, nonfocal Skin: No rash or lesion Psychiatry normal mood and affect LABORATORY PANEL:  Female CBC Recent Labs  Lab 05/16/21 0634  WBC 2.3*  HGB 8.0*  HCT 22.8*  PLT 71*    ------------------------------------------------------------------------------------------------------------------ Chemistries  Recent Labs  Lab 05/14/21 1535 05/15/21 1347 05/16/21 0634  NA 134*   < > 143  K 2.7*   < > 3.5  CL 102   < > 114*  CO2 14*   < > 14*  GLUCOSE 108*   < > 92  BUN 103*   < > 97*  CREATININE 3.52*   < > 2.59*  CALCIUM 7.8*   < > 8.4*  MG 1.3*  --   --   AST 27   < > 25  ALT 11   < > 11  ALKPHOS 94   < > 86  BILITOT 1.0   < > 0.7   < > = values in this interval not displayed.    MEDICATIONS:  Scheduled Meds: . sodium chloride   Intravenous Once  . albuterol  2 puff Inhalation Q6H  . vitamin C  500 mg Oral Daily  . enoxaparin (LOVENOX) injection  30 mg Subcutaneous Q24H  . hydroxychloroquine  200 mg Oral Daily  . metoprolol succinate  50 mg Oral Daily  . mycophenolate  500 mg Oral BID  . zinc sulfate  220 mg Oral Daily   Continuous Infusions: . sodium chloride 250 mL (05/16/21 1007)  . promethazine (PHENERGAN) injection (IM or IVPB)    . remdesivir 100 mg in NS 100 mL 100 mg (05/16/21 1009)   RADIOLOGY:  No  results found. ASSESSMENT AND PLAN:  55 year old female with a known history of CKD stage IV with lupus nephritis, anemia of chronic kidney disease, metastatic breast cancer, HFrEF with improved EF admitted for pneumonia due to COVID-19 virus  Principal Problem:   Pneumonia due to COVID-19 virus Active Problems:   Essential hypertension   Metastatic breast cancer (HCC)   Anemia associated with stage 4 chronic renal failure (HCC)   Lupus erythematosus tumidus   Leukopenia   Hypokalemia due to excessive gastrointestinal loss of potassium   Chronic systolic CHF (congestive heart failure) (HCC)   Acute kidney injury  superimposed on CKD IV ([place])   Gastroenteritis due to COVID-19 virus   Increased anion gap metabolic acidosis  Pneumonia and gastroenteritis due to COVID-19 virus - POA -Continue IV remdesivir day 3/5, albuterol, antitussives and vitamins -She is saturating 100% on room air and not wheezing so no need of steroids Monitor inflammatory markers -Her procalcitonin is 0.31.  stopped Rocephin and Zithromax on 7/28 -Pharmacy notified 1/4 positive blood culture on 7/28 with BCID growing staph species (not staff aureus or epidermidis).  This is likely contaminant based on her clinical improvement.  I will repeat blood culture today.  Severe hypokalemia Repleted and resolved  Acute kidney injury superimposed on CKD stage IV Anion gap metabolic acidosis -Due to ongoing volume loss from vomiting, diarrhea and poor p.o. intake -Monitor. Creat 2.59 (2.51). consider Nephro c/s if worsens  Essential hypertension She was initially hypotensive but blood pressure improved with hydration.  Restarted Toprol at 50 mg yesterday but will cut back to 25 mg p.o. daily due to soft blood pressure  Metastatic breast cancer  - Hold Xeloda per Dr Grayland Ormond. He will see her while here. -Chest x-ray showing possible metastatic lesion on right proximal humerus.  Discussed with Dr. Grayland Ormond   Lupus nephritis -Continue CellCept and Plaquenil  Pancytopenia Likely due to chemo.  Anemia can also be due to underlying CKD Patient normally receives blood transfusion once a month with her oncology/at cancer center Her hemoglobin is 8.0 patient feels very strongly about receiving transfusion.  We will [place] order 1 unit of packed red blood cell transfusion today  HFrEF with improved EF - Not acutely exacerbated - EF now 70% 01/2020.  Previously 25 to 30% - Monitor for exacerbation given mild fluid resuscitation - Holding Demadex and Entresto -Resume metoprolol at lower dose   Body mass index is 25.85 kg/m.  Net IO  Since Admission: 1,925.08 mL [05/16/21 1138]   LOS: 2 days   Consultants: None    Antibiotics: Given dose of Rocephin and Zithromax in the ED and discontinued on 05/15/2021  Status is: Inpatient  Remains inpatient appropriate because:IV treatments appropriate due to intensity of illness or inability to take PO and Inpatient level of care appropriate due to severity of illness, ongoing nausea, diarrhea and pulmonary symptoms  Dispo: The patient is from: Home              Anticipated d/c is to: Home              Patient currently is not medically stable to d/c.   Difficult to place patient No    DVT prophylaxis:       enoxaparin (LOVENOX) injection 30 mg Start: 05/14/21 2200     Family Communication: Updated husband [name] over phone on 7/29   All the records are reviewed and case discussed with Nursing and TOC team. Management plans discussed with the patient, nursing and  they are in agreement.  CODE STATUS: Full Code Level of care: Med-Surg  TOTAL TIME TAKING CARE OF THIS PATIENT: 35 minutes.   More than 50% of the time was spent in counseling/coordination of care: YES  POSSIBLE D/C IN 2-3 DAYS, DEPENDING ON CLINICAL CONDITION.   Max Sane M.D on 05/16/2021 at 11:38 AM  [facility]   CC: Primary care physician; Ria Bush, MD  Note: This dictation was prepared with Dragon dictation along with smaller phrase technology. Any transcriptional errors that result from this process are unintentional.

## 2021-05-16 NOTE — Progress Notes (Signed)
PHARMACY - PHYSICIAN COMMUNICATION CRITICAL VALUE ALERT - BLOOD CULTURE IDENTIFICATION (BCID)  Melanie Holloway is an 55 y.o. female who presented to [facility] on 05/14/2021 with a chief complaint of shortness of breath, + COVID  Assessment:  7/27 GPC in 1 of 2 sets, BCID is Staphylococcus species (not S aureus or epidermidis)  Name of physician (or Provider) Contacted: Dr Manuella Ghazi  Current antibiotics: none  Changes to prescribed antibiotics recommended:  Recommendations accepted by provider - monitor as suspect GPC is contaminant  Results for orders placed or performed during the hospital encounter of 05/14/21  Blood Culture ID Panel (Reflexed) (Collected: 05/14/2021  7:21 PM)  Result Value Ref Range   Enterococcus faecalis NOT DETECTED NOT DETECTED   Enterococcus Faecium NOT DETECTED NOT DETECTED   Listeria monocytogenes NOT DETECTED NOT DETECTED   Staphylococcus species DETECTED (A) NOT DETECTED   Staphylococcus aureus (BCID) NOT DETECTED NOT DETECTED   Staphylococcus epidermidis NOT DETECTED NOT DETECTED   Staphylococcus lugdunensis NOT DETECTED NOT DETECTED   Streptococcus species NOT DETECTED NOT DETECTED   Streptococcus agalactiae NOT DETECTED NOT DETECTED   Streptococcus pneumoniae NOT DETECTED NOT DETECTED   Streptococcus pyogenes NOT DETECTED NOT DETECTED   A.calcoaceticus-baumannii NOT DETECTED NOT DETECTED   Bacteroides fragilis NOT DETECTED NOT DETECTED   Enterobacterales NOT DETECTED NOT DETECTED   Enterobacter cloacae complex NOT DETECTED NOT DETECTED   Escherichia coli NOT DETECTED NOT DETECTED   Klebsiella aerogenes NOT DETECTED NOT DETECTED   Klebsiella oxytoca NOT DETECTED NOT DETECTED   Klebsiella pneumoniae NOT DETECTED NOT DETECTED   Proteus species NOT DETECTED NOT DETECTED   Salmonella species NOT DETECTED NOT DETECTED   Serratia marcescens NOT DETECTED NOT DETECTED   Haemophilus influenzae NOT DETECTED NOT DETECTED   Neisseria meningitidis NOT DETECTED NOT  DETECTED   Pseudomonas aeruginosa NOT DETECTED NOT DETECTED   Stenotrophomonas maltophilia NOT DETECTED NOT DETECTED   Candida albicans NOT DETECTED NOT DETECTED   Candida auris NOT DETECTED NOT DETECTED   Candida glabrata NOT DETECTED NOT DETECTED   Candida krusei NOT DETECTED NOT DETECTED   Candida parapsilosis NOT DETECTED NOT DETECTED   Candida tropicalis NOT DETECTED NOT DETECTED   Cryptococcus neoformans/gattii NOT DETECTED NOT DETECTED    Doreene Eland, PharmD, BCPS.   Work Cell: [phone] 05/16/2021 1:00 PM

## 2021-05-16 NOTE — Progress Notes (Signed)
Diagnosis: COVID  Provider:  Marshell Garfinkel, MD  Procedure: Infusion  IV Type: Peripheral, IV Location: L Antecubital  Bebtelovimab, Dose: 175 mg  Infusion Start Time: 1661  Infusion Stop Time: 9694  Post Infusion IV Care: Observation period completed and Peripheral IV Discontinued  Discharge: Condition: Good, Destination: Home . AVS provided to patient.   Performed by:  Jonelle Sidle, RN

## 2021-05-16 NOTE — Progress Notes (Signed)
PHARMACY - PHYSICIAN COMMUNICATION CRITICAL VALUE ALERT - BLOOD CULTURE IDENTIFICATION (BCID)   BCID results: 1 (anaerobic) of 4 positive for Staph species, no resistance genes detected.  Per lab "blood culture results may not be optimal due to an inadequate volume of blood received in culture bottles."  Pt COVID+ on Remdesivir, but no abx ordered at this time.   Name of physician contacted: Prudy Feeler, MD   Changes to prescribed antibiotics required: No reply from MD.  Passed off to ID RPh for follow-up.   Renda Rolls, PharmD, [facility] 05/16/2021 6:39 AM

## 2021-05-16 NOTE — Progress Notes (Signed)
Patient refused PM Lovenox dose. It appears she refused last evening as well. She stated "I don't want no shot in my stomach."  -nothing follows

## 2021-05-16 NOTE — TOC Initial Note (Addendum)
Transition of Care [facility]) - Initial/Assessment Note    Patient Details  Name: Melanie Holloway MRN: [id number] Date of Birth: 03-Sep-1966  Transition of Care [facility]) CM/SW Contact:    Magnus Ivan, LCSW Phone Number: 05/16/2021, 11:15 AM  Clinical Narrative:       Completed high risk assessment via phone due to isolation status. Patient is COVID positive. Patient reported she lives with her husband. Son provides transportation. PCP is Dr. Danise Mina. [facility]. Patient uses a rolling walker at home. No SNF history. Patient states she had HH in the past through [facility] and is requesting [place] again when discharged. No agency preference. CSW reached out to [place] with [facility] who is reviewing referral.            3:24Corene Cornea with [facility] accepted patient for HHPT.  Expected Discharge Plan: [place] Barriers to Discharge: Continued Medical Work up   Patient Goals and CMS Choice Patient states their goals for this hospitalization and ongoing recovery are:: would like home health services CMS Medicare.gov Compare Post Acute Care list provided to:: Patient Choice offered to / list presented to : Patient  Expected Discharge Plan and Services Expected Discharge Plan: [place]       Living arrangements for the past 2 months: Single Family Home                           HH Arranged: PT          Prior Living Arrangements/Services Living arrangements for the past 2 months: Single Family Home Lives with:: Spouse Patient language and need for interpreter reviewed:: Yes Do you feel safe going back to the place where you live?: Yes      Need for Family Participation in Patient Care: Yes (Comment) Care giver support system in place?: Yes (comment) Current home services: DME Criminal Activity/Legal Involvement Pertinent to Current Situation/Hospitalization: No - Comment as needed  Activities of Daily Living Home Assistive  Devices/Equipment: Gilford Rile (specify type) ADL Screening (condition at time of admission) Patient's cognitive ability adequate to safely complete daily activities?: Yes Is the patient deaf or have difficulty hearing?: No Does the patient have difficulty seeing, even when wearing glasses/contacts?: Yes Does the patient have difficulty concentrating, remembering, or making decisions?: No Patient able to express need for assistance with ADLs?: No Does the patient have difficulty dressing or bathing?: No Independently performs ADLs?: Yes (appropriate for developmental age) Does the patient have difficulty walking or climbing stairs?: No Weakness of Legs: None Weakness of Arms/Hands: None  Permission Sought/Granted Permission sought to share information with : Customer service manager, Family Supports Permission granted to share information with : Yes, Verbal Permission Granted     Permission granted to share info w AGENCY: [place], DME agencies as needed - no agency preferences        Emotional Assessment       Orientation: : Oriented to Self, Oriented to Place, Oriented to  Time, Oriented to Situation Alcohol / Substance Use: Not Applicable Psych Involvement: No (comment)  Admission diagnosis:  Nausea [R11.0] AKI (acute kidney injury) ([place]) [N17.9] Malignant neoplasm of left female breast, unspecified estrogen receptor status, unspecified site of breast ([place]) [C50.912] Sepsis with acute organ dysfunction without septic shock, due to unspecified organism, unspecified type ([place]) [A41.9, R65.20] COVID [U07.1] Pneumonia due to COVID-19 virus [U07.1, J12.82] Patient Active Problem List   Diagnosis Date Noted  Pneumonia due to COVID-19 virus 05/14/2021   Gastroenteritis due to COVID-19 virus 05/14/2021   Increased anion gap metabolic acidosis 40/34/7425   Iron overload due to repeated red blood cell transfusions 02/19/2021   Anemia associated with chronic renal failure 12/25/2020    Malignant neoplasm of overlapping sites of left breast in female, estrogen receptor positive ([place]) 11/27/2020   CKD (chronic kidney disease) stage 4, GFR 15-29 ml/min (HCC) 03/15/2020   Aortic atherosclerosis ([place]) 02/13/2020   Acute kidney injury superimposed on CKD IV ([place]) 01/16/2020   Goals of care, counseling/discussion 06/15/2019   Chronic pain of left ankle 02/28/2019   Postcoital bleeding 08/30/2018   Dyslipidemia 06/12/2018   Heart valve regurgitation 95/63/8756   Chronic systolic CHF (congestive heart failure) ([place]) 03/10/2018   Lupus ([place]) 12/18/2017   Lymphedema 12/17/2017   Pedal edema 12/16/2017   Bone metastases ([place]) 11/08/2017   Pericardial effusion 11/08/2017   Cancer of intrathoracic lymph nodes, secondary ([place]) 11/08/2017   Interstitial pneumonitis ([place]) 11/08/2017   Pancytopenia ([place]) 11/08/2017   Abnormal CT scan, pelvis 09/29/2017   Splenic mass 09/29/2017   Hematuria 09/29/2017   Hemorrhoid 09/29/2017   Encounter for general adult medical examination with abnormal findings 08/07/2016   Lupus nephritis, ISN/RPS class V ([place]) 11/28/2015   Beta thalassemia ([place]) 07/18/2015   Hypokalemia due to excessive gastrointestinal loss of potassium 07/19/2014   Malignant neoplasm of left breast in female, estrogen receptor positive ([place]) 07/18/2013   Leukopenia 06/23/2013   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   Anemia associated with stage 4 chronic renal failure ([place]) 06/27/2012   Vitamin D deficiency 06/27/2012   Obesity, Class I, BMI 30.0-34.9 (see actual BMI) 04/19/2012   Metastatic breast cancer ([place])    Essential hypertension 06/27/2008   PCP:  Ria Bush, MD Pharmacy:   [id number] [place] [place] [address] [place] [address] Phone: [phone] Fax: [phone]  [facility] [place] [place] [address] Phone: [phone] Fax:  [phone]  [facility] [place], [place] [address] [place] [address] Phone: [phone] Fax: [place] [address] [place] [address] Phone: [phone] Fax: [phone]     Social Determinants of Health (SDOH) Interventions    Readmission Risk Interventions Readmission Risk Prevention Plan 05/16/2021  Transportation Screening Complete  PCP or Specialist Appt within 3-5 Days Complete  HRI or [place] Complete  Social Work Consult for [place] Planning/Counseling Complete  Palliative Care Screening Not Applicable  Medication Review Press photographer) Complete  Some recent data might be hidden

## 2021-05-17 ENCOUNTER — Other Ambulatory Visit: Payer: Self-pay

## 2021-05-17 DIAGNOSIS — U071 COVID-19: Secondary | ICD-10-CM | POA: Diagnosis not present

## 2021-05-17 DIAGNOSIS — J1282 Pneumonia due to coronavirus disease 2019: Secondary | ICD-10-CM | POA: Diagnosis not present

## 2021-05-17 LAB — COMPREHENSIVE METABOLIC PANEL
ALT: 11 U/L (ref 0–44)
AST: 28 U/L (ref 15–41)
Albumin: 3 g/dL — ABNORMAL LOW (ref 3.5–5.0)
Alkaline Phosphatase: 77 U/L (ref 38–126)
Anion gap: 13 (ref 5–15)
BUN: 86 mg/dL — ABNORMAL HIGH (ref 6–20)
CO2: 16 mmol/L — ABNORMAL LOW (ref 22–32)
Calcium: 8.5 mg/dL — ABNORMAL LOW (ref 8.9–10.3)
Chloride: 113 mmol/L — ABNORMAL HIGH (ref 98–111)
Creatinine, Ser: 2.64 mg/dL — ABNORMAL HIGH (ref 0.44–1.00)
GFR, Estimated: 21 mL/min — ABNORMAL LOW (ref >60)
Glucose, Bld: 99 mg/dL (ref 70–99)
Potassium: 2.7 mmol/L — CL (ref 3.5–5.1)
Sodium: 142 mmol/L (ref 135–145)
Total Bilirubin: 0.8 mg/dL (ref 0.3–1.2)
Total Protein: 5.7 g/dL — ABNORMAL LOW (ref 6.5–8.1)

## 2021-05-17 LAB — CBC WITH DIFFERENTIAL/PLATELET
Abs Immature Granulocytes: 0.03 10*3/uL (ref 0.00–0.07)
Basophils Absolute: 0 10*3/uL (ref 0.0–0.1)
Basophils Relative: 0 %
Eosinophils Absolute: 0 10*3/uL (ref 0.0–0.5)
Eosinophils Relative: 0 %
HCT: 25.5 % — ABNORMAL LOW (ref 36.0–46.0)
Hemoglobin: 9.2 g/dL — ABNORMAL LOW (ref 12.0–15.0)
Immature Granulocytes: 1 %
Lymphocytes Relative: 29 %
Lymphs Abs: 0.7 10*3/uL (ref 0.7–4.0)
MCH: 26.7 pg (ref 26.0–34.0)
MCHC: 36.1 g/dL — ABNORMAL HIGH (ref 30.0–36.0)
MCV: 74.1 fL — ABNORMAL LOW (ref 80.0–100.0)
Monocytes Absolute: 0.3 10*3/uL (ref 0.1–1.0)
Monocytes Relative: 12 %
Neutro Abs: 1.4 10*3/uL — ABNORMAL LOW (ref 1.7–7.7)
Neutrophils Relative %: 58 %
Platelets: 67 10*3/uL — ABNORMAL LOW (ref 150–400)
RBC: 3.44 MIL/uL — ABNORMAL LOW (ref 3.87–5.11)
RDW: 17.3 % — ABNORMAL HIGH (ref 11.5–15.5)
WBC: 2.4 10*3/uL — ABNORMAL LOW (ref 4.0–10.5)
nRBC: 1.2 % — ABNORMAL HIGH (ref 0.0–0.2)

## 2021-05-17 LAB — GLUCOSE, CAPILLARY: Glucose-Capillary: 98 mg/dL (ref 70–99)

## 2021-05-17 LAB — PHOSPHORUS: Phosphorus: 5.7 mg/dL — ABNORMAL HIGH (ref 2.5–4.6)

## 2021-05-17 LAB — C-REACTIVE PROTEIN: CRP: 2.2 mg/dL — ABNORMAL HIGH (ref <1.0)

## 2021-05-17 LAB — D-DIMER, QUANTITATIVE: D-Dimer, Quant: 16.12 ug/mL-FEU — ABNORMAL HIGH (ref 0.00–0.50)

## 2021-05-17 LAB — MAGNESIUM: Magnesium: 1.4 mg/dL — ABNORMAL LOW (ref 1.7–2.4)

## 2021-05-17 MED ORDER — SODIUM BICARBONATE 650 MG PO TABS
650.0000 mg | ORAL_TABLET | Freq: Two times a day (BID) | ORAL | Status: DC
  Administered 2021-05-17 – 2021-05-21 (×9): 650 mg via ORAL
  Filled 2021-05-17 (×10): qty 1

## 2021-05-17 MED ORDER — MAGNESIUM SULFATE 4 GM/100ML IV SOLN
4.0000 g | Freq: Once | INTRAVENOUS | Status: AC
  Administered 2021-05-17: 14:00:00 4 g via INTRAVENOUS
  Filled 2021-05-17: qty 100

## 2021-05-17 MED ORDER — SODIUM CHLORIDE 0.9 % IV SOLN: INTRAVENOUS | Status: DC

## 2021-05-17 MED ORDER — LOPERAMIDE HCL 2 MG PO CAPS
2.0000 mg | ORAL_CAPSULE | Freq: Four times a day (QID) | ORAL | Status: DC | PRN
  Administered 2021-05-17 – 2021-05-18 (×2): 2 mg via ORAL
  Filled 2021-05-17 (×2): qty 1

## 2021-05-17 MED ORDER — POTASSIUM CHLORIDE CRYS ER 20 MEQ PO TBCR
40.0000 meq | EXTENDED_RELEASE_TABLET | ORAL | Status: AC
  Administered 2021-05-17: 09:00:00 40 meq via ORAL
  Filled 2021-05-17 (×2): qty 2

## 2021-05-17 MED ORDER — SACUBITRIL-VALSARTAN 24-26 MG PO TABS
1.0000 | ORAL_TABLET | Freq: Every day | ORAL | Status: DC
  Administered 2021-05-17 – 2021-05-18 (×2): 1 via ORAL
  Filled 2021-05-17 (×3): qty 1

## 2021-05-17 NOTE — Progress Notes (Signed)
PT Cancellation Note  Patient Details Name: Melanie Holloway MRN: [id number] DOB: 24-Aug-1966   Cancelled Treatment:    Reason Eval/Treat Not Completed: Medical issues which prohibited therapy.  Labs are out of range for PT and will retry as time and pt allow.   Ramond Dial 05/17/2021, 3:08 PM  Mee Hives, PT MS Acute Rehab Dept. Number: [place]

## 2021-05-17 NOTE — Progress Notes (Signed)
PROGRESS NOTE  Melanie Holloway  DOB: October 25, 1965  PCP: Ria Bush, MD ZOX:[id number]  DOA: 05/14/2021  LOS: 3 days  Hospital Day: 4   Chief Complaint  Patient presents with   Shortness of Breath    Brief narrative: Melanie Holloway is a 55 y.o. female with PMH significant for lupus nephritis,, CKD stage IV, metastatic breast cancer, chronic systolic CHF, chronic anemia.   Patient presented to the ED on 7/27 with complaint of shortness of breath and cough progressively worsening for 2 weeks also associated with nausea, vomiting and diarrhea  In the ED, patient was afebrile but hypotensive at 85/64, tachycardia at 103, breathing 100% on room air. Labs showed WC count 2.3, hemoglobin 8.2, creatinine elevated to 2.52 however baseline of 1.89, potassium low at 2.7, magnesium low at 1.3 COVID PCR positive EKG with sinus tachycardia at 101 bpm, no ST-T wave changes CT chest, abdomen and pelvis showed bilateral lower lobe groundglass nodularity concerning for pneumonia, also showed mild colitis. Patient was admitted to hospital service for further evaluation management  Subjective: Patient was seen and examined this morning.  Middle-aged African-American female.  Looks lethargic.  Lying on bed.  States he still has nausea vomiting and diarrhea.  Last vomiting was yesterday.  Had 5 episodes of loose bowel movement last night.  Currently not on IV fluid  Assessment/Plan: COVID pneumonia Acute respiratory failure with hypoxia  -Presented with progressive worsening shortness of breath, cough, GI symptoms -COVID test: COVID PCR positive on admission -Chest imaging: CT chest on admission with bilateral pneumonia -Treatment: Currently on a 5-day course of IV remdesivir to complete on 7/31.  Breathing comfortably on room air.  Not on steroids. -Supportive care: Vitamin C, Zinc, PRN inhalers, Tylenol, Antitussives (benzonatate/ Mucinex/Tussionex).   -Encouraged incentive spirometry, prone  position, out of bed and early mobilization as much as possible -WBC and inflammatory markers trend as below.  Recent Labs  Lab 05/14/21 1535 05/14/21 1633 05/14/21 1921 05/14/21 2226 05/15/21 1347 05/16/21 0634 05/17/21 0637  SARSCOV2NAA  --  POSITIVE*  --   --   --   --   --   WBC 2.3*  --   --   --  2.4* 2.3* 2.4*  LATICACIDVEN 1.1  --  1.1  --   --   --   --   PROCALCITON  --   --   --  0.31  --   --   --   DDIMER  --   --   --   --  8.73* 11.71* 16.12*  CRP  --   --   --   --  1.4* 1.7*  --   ALT 11  --   --   --  11 11 11    Blood culture positive -Staff species growing in 1 out of 4 blood cultures sent on admission.  Likely contaminant.  Acute COVID-related gastroenteritis -Had nausea, vomiting and diarrhea at presentation.  CT abdomen with findings of mild colitis and diarrheal stool -Low oral intake and diarrheal loss probably contributed to AKI and loss electrolytes. -Last vomiting yesterday.  5 episodes of watery diarrhea last night. -Start on normal saline at 75 mill per hour.  Start Imodium every 6 hours as needed. -Patient is currently on clear liquid diet and wants to advance to soft diet.    AKI on CKD 4 Acute metabolic acidosis -Creatinine bicarbonate trend as below. -Creatinine was elevated to 3.52 on admission, from a baseline of 1.89 last month.  Improving gradually, 2.64 today.  Continue to monitor -Patient continues to have metabolic acidosis, serum bicarb low at 16 today. -I will start the patient on sodium bicarb tablets Recent Labs    12/25/20 1441 01/22/21 0950 02/19/21 1412 03/11/21 0928 04/01/21 0931 04/22/21 1406 05/14/21 1535 05/15/21 1347 05/16/21 0634 05/17/21 0637  BUN 38* 50* 43* 64* 54* 79* 103* 98* 97* 86*  CREATININE 1.81* 1.69* 1.90* 1.96* 1.89* 2.45* 3.52* 2.51* 2.59* 2.64*  CO2 21* 16* 24 18* 16* 17* 14* 14* 14* 16*   Severe hypokalemia Hypomagnesemia Hyperphosphatemia -Labs from this morning with potassium level low at 2.7  and magnesium level low at 1.4.  -Oral and IV replacement ordered. -Phosphorus level is elevated to 5.7. ???  Cause.  Continue to monitor. Recent Labs  Lab 05/14/21 1535 05/15/21 1347 05/16/21 0634 05/17/21 0637  K 2.7* 3.1* 3.5 2.7*  MG 1.3*  --   --  1.4*  PHOS  --   --   --  5.7*   History of systolic CHF Essential hypertension -Patient was initially hypotensive at presentation, blood pressure subsequently improved with hydration.   -EF in 2019 was reduced to 25 to 30%.  EF in last echo from April 2021 had improved to 70%. -Home meds include Toprol 50 mg daily, Entresto 24 mg / 26 mg daily, torsemide 40 mg twice daily. -Currently on metoprolol succinate at a reduced dose of 25 mg daily.  Demadex and Delene Loll are on hold. -I would resume Entresto today but keep Demadex on hold -Continue to monitor blood pressure and look out for symptoms of CHF.   Metastatic breast cancer  -Was on Xeloda at home.  Discussed with oncologist.  We will hold Xeloda for now. -Chest x-ray showing possible metastatic lesion on right proximal humerus. Continue to follow-up with oncology as an outpatient   lupus nephritis -Continue CellCept and Plaquenil   Pancytopenia -Likely due to immunosuppressants. -Trend as below.  Overall stable. Recent Labs  Lab 05/14/21 1535 05/15/21 1347 05/16/21 0634 05/16/21 2204 05/17/21 0637  WBC 2.3* 2.4* 2.3*  --  2.4*  NEUTROABS 1.6* 1.5* 1.4*  --  1.4*  HGB 8.2* 8.3* 8.0* 8.9* 9.2*  HCT 22.9* 23.7* 22.8* 24.8* 25.5*  MCV 75.8* 78.2* 75.7*  --  74.1*  PLT 66* 69* 71*  --  67*   Mobility: Encourage ambulation.  PT eval ordered. Code Status:   Code Status: Full Code  Nutritional status: Body mass index is 25.85 kg/m.     Diet:  Diet Order             DIET SOFT Room service appropriate? Yes; Fluid consistency: Thin  Diet effective now                  DVT prophylaxis: Patient does not want Lovenox shots.  Start SCD boots Place and maintain  sequential compression device Start: 05/17/21 0904 enoxaparin (LOVENOX) injection 30 mg Start: 05/14/21 2200   Antimicrobials: IV remdesivir Fluid: Currently not on IV fluid Consultants: Oncology Family Communication: None at bedside  Status is: Inpatient  Remains inpatient appropriate because: Continues to need IV hydration, monitoring  Dispo: The patient is from: Home              Anticipated d/c is to: Home with home health most likely.  Pending PT eval              Patient currently is not medically stable to d/c.   Difficult to place patient  No     Infusions:   sodium chloride 250 mL (05/16/21 1007)   sodium chloride Stopped (05/17/21 1055)   magnesium sulfate bolus IVPB     promethazine (PHENERGAN) injection (IM or IVPB)     remdesivir 100 mg in NS 100 mL Stopped (05/17/21 1046)    Scheduled Meds:  albuterol  2 puff Inhalation Q6H   vitamin C  500 mg Oral Daily   enoxaparin (LOVENOX) injection  30 mg Subcutaneous Q24H   hydroxychloroquine  200 mg Oral Daily   metoprolol succinate  25 mg Oral Daily   mycophenolate  500 mg Oral BID   sacubitril-valsartan  1 tablet Oral Daily   sodium bicarbonate  650 mg Oral BID   zinc sulfate  220 mg Oral Daily    Antimicrobials: Anti-infectives (From admission, onward)    Start     Dose/Rate Route Frequency Ordered Stop   05/15/21 1600  hydroxychloroquine (PLAQUENIL) tablet 200 mg        200 mg Oral Daily 05/15/21 1459     05/15/21 1000  remdesivir 100 mg in sodium chloride 0.9 % 100 mL IVPB  Status:  Discontinued       See Hyperspace for full Linked Orders Report.   100 mg 200 mL/hr over 30 Minutes Intravenous Daily 05/14/21 1958 05/14/21 2210   05/15/21 1000  remdesivir 100 mg in sodium chloride 0.9 % 100 mL IVPB       See Hyperspace for full Linked Orders Report.   100 mg 200 mL/hr over 30 Minutes Intravenous Daily 05/14/21 2210 05/19/21 0959   05/14/21 2315  remdesivir 100 mg in sodium chloride 0.9 % 100 mL IVPB        See Hyperspace for full Linked Orders Report.   100 mg 200 mL/hr over 30 Minutes Intravenous  Once 05/14/21 2210 05/15/21 0106   05/14/21 2215  remdesivir 100 mg in sodium chloride 0.9 % 100 mL IVPB       See Hyperspace for full Linked Orders Report.   100 mg 200 mL/hr over 30 Minutes Intravenous  Once 05/14/21 2210 05/15/21 0036   05/14/21 2100  remdesivir 200 mg in sodium chloride 0.9% 250 mL IVPB  Status:  Discontinued       See Hyperspace for full Linked Orders Report.   200 mg 580 mL/hr over 30 Minutes Intravenous Once 05/14/21 1958 05/14/21 2210   05/14/21 1915  cefTRIAXone (ROCEPHIN) 2 g in sodium chloride 0.9 % 100 mL IVPB  Status:  Discontinued        2 g 200 mL/hr over 30 Minutes Intravenous Every 24 hours 05/14/21 1905 05/15/21 1506   05/14/21 1915  azithromycin (ZITHROMAX) 500 mg in sodium chloride 0.9 % 250 mL IVPB  Status:  Discontinued        500 mg 250 mL/hr over 60 Minutes Intravenous Every 24 hours 05/14/21 1905 05/15/21 1506       PRN meds: sodium chloride, acetaminophen, alum & mag hydroxide-simeth, chlorpheniramine-HYDROcodone, guaiFENesin-dextromethorphan, loperamide, ondansetron **OR** ondansetron (ZOFRAN) IV, promethazine (PHENERGAN) injection (IM or IVPB)   Objective: Vitals:   05/17/21 0417 05/17/21 0753  BP: 117/66 110/64  Pulse: 74 83  Resp: 14 17  Temp: 98.5 F (36.9 C) (!) 97.5 F (36.4 C)  SpO2: 100% 100%    Intake/Output Summary (Last 24 hours) at 05/17/2021 1320 Last data filed at 05/17/2021 0636 Gross per 24 hour  Intake 687.28 ml  Output 350 ml  Net 337.28 ml   [employer]  05/14/21 1529  Weight: 58.1 kg   Weight change:  Body mass index is 25.85 kg/m.   Physical Exam: General exam: Pleasant, middle-aged African-American female.  Not in distress but looks lethargic Skin: No rashes, lesions or ulcers. HEENT: Atraumatic, normocephalic, no obvious bleeding Lungs: Clear to auscultation bilaterally CVS: Regular rate and rhythm,  no murmur GI/Abd soft, nontender, nondistended, bowel sound present CNS: looks lethargic, opens eyes on verbal command and able to follow command. Psychiatry: Depressed look Extremities: No pedal edema, no calf tenderness  Data Review: I have personally reviewed the laboratory data and studies available.  Recent Labs  Lab 05/14/21 1535 05/15/21 1347 05/16/21 0634 05/16/21 2204 05/17/21 0637  WBC 2.3* 2.4* 2.3*  --  2.4*  NEUTROABS 1.6* 1.5* 1.4*  --  1.4*  HGB 8.2* 8.3* 8.0* 8.9* 9.2*  HCT 22.9* 23.7* 22.8* 24.8* 25.5*  MCV 75.8* 78.2* 75.7*  --  74.1*  PLT 66* 69* 71*  --  67*   Recent Labs  Lab 05/14/21 1535 05/15/21 1347 05/16/21 0634 05/17/21 0637  NA 134* 139 143 142  K 2.7* 3.1* 3.5 2.7*  CL 102 111 114* 113*  CO2 14* 14* 14* 16*  GLUCOSE 108* 109* 92 99  BUN 103* 98* 97* 86*  CREATININE 3.52* 2.51* 2.59* 2.64*  CALCIUM 7.8* 7.8* 8.4* 8.5*  MG 1.3*  --   --  1.4*  PHOS  --   --   --  5.7*    F/u labs ordered Unresulted Labs (From admission, onward)     Start     Ordered   05/21/21 0500  Creatinine, serum  (enoxaparin (LOVENOX)    CrCl < 30 ml/min)  Weekly,   STAT     Comments: while on enoxaparin therapy.    05/14/21 1958   05/15/21 0500  CBC with Differential/Platelet  Daily,   STAT      05/14/21 1958   05/15/21 0500  Comprehensive metabolic panel  Daily,   STAT      05/14/21 1958   05/15/21 0500  C-reactive protein  Daily,   STAT      05/14/21 1958   05/15/21 0500  D-dimer, quantitative  Daily,   STAT      05/14/21 1958            Signed, Terrilee Croak, MD [facility] 05/17/2021

## 2021-05-17 NOTE — Progress Notes (Signed)
The patient refused her PM Lovenox shot once again. She was educated as to the benefits of this medication and she still refused.   -nothing follows

## 2021-05-18 DIAGNOSIS — J1282 Pneumonia due to coronavirus disease 2019: Secondary | ICD-10-CM | POA: Diagnosis not present

## 2021-05-18 DIAGNOSIS — U071 COVID-19: Secondary | ICD-10-CM | POA: Diagnosis not present

## 2021-05-18 LAB — CBC WITH DIFFERENTIAL/PLATELET
Abs Immature Granulocytes: 0.02 10*3/uL (ref 0.00–0.07)
Basophils Absolute: 0 10*3/uL (ref 0.0–0.1)
Basophils Relative: 0 %
Eosinophils Absolute: 0 10*3/uL (ref 0.0–0.5)
Eosinophils Relative: 0 %
HCT: 24.2 % — ABNORMAL LOW (ref 36.0–46.0)
Hemoglobin: 7.9 g/dL — ABNORMAL LOW (ref 12.0–15.0)
Immature Granulocytes: 1 %
Lymphocytes Relative: 29 %
Lymphs Abs: 0.7 10*3/uL (ref 0.7–4.0)
MCH: 26.3 pg (ref 26.0–34.0)
MCHC: 32.6 g/dL (ref 30.0–36.0)
MCV: 80.7 fL (ref 80.0–100.0)
Monocytes Absolute: 0.3 10*3/uL (ref 0.1–1.0)
Monocytes Relative: 10 %
Neutro Abs: 1.5 10*3/uL — ABNORMAL LOW (ref 1.7–7.7)
Neutrophils Relative %: 60 %
Platelets: 59 10*3/uL — ABNORMAL LOW (ref 150–400)
RBC: 3 MIL/uL — ABNORMAL LOW (ref 3.87–5.11)
RDW: 18.3 % — ABNORMAL HIGH (ref 11.5–15.5)
WBC: 2.5 10*3/uL — ABNORMAL LOW (ref 4.0–10.5)
nRBC: 1.6 % — ABNORMAL HIGH (ref 0.0–0.2)

## 2021-05-18 LAB — COMPREHENSIVE METABOLIC PANEL
ALT: 12 U/L (ref 0–44)
AST: 30 U/L (ref 15–41)
Albumin: 2.6 g/dL — ABNORMAL LOW (ref 3.5–5.0)
Alkaline Phosphatase: 89 U/L (ref 38–126)
Anion gap: 10 (ref 5–15)
BUN: 78 mg/dL — ABNORMAL HIGH (ref 6–20)
CO2: 14 mmol/L — ABNORMAL LOW (ref 22–32)
Calcium: 8.2 mg/dL — ABNORMAL LOW (ref 8.9–10.3)
Chloride: 113 mmol/L — ABNORMAL HIGH (ref 98–111)
Creatinine, Ser: 2.58 mg/dL — ABNORMAL HIGH (ref 0.44–1.00)
GFR, Estimated: 21 mL/min — ABNORMAL LOW (ref >60)
Glucose, Bld: 101 mg/dL — ABNORMAL HIGH (ref 70–99)
Potassium: 3.1 mmol/L — ABNORMAL LOW (ref 3.5–5.1)
Sodium: 137 mmol/L (ref 135–145)
Total Bilirubin: 0.7 mg/dL (ref 0.3–1.2)
Total Protein: 4.9 g/dL — ABNORMAL LOW (ref 6.5–8.1)

## 2021-05-18 LAB — D-DIMER, QUANTITATIVE: D-Dimer, Quant: 15.46 ug/mL-FEU — ABNORMAL HIGH (ref 0.00–0.50)

## 2021-05-18 LAB — CULTURE, BLOOD (ROUTINE X 2)

## 2021-05-18 LAB — C-REACTIVE PROTEIN: CRP: 2.9 mg/dL — ABNORMAL HIGH (ref <1.0)

## 2021-05-18 MED ORDER — POTASSIUM CHLORIDE CRYS ER 20 MEQ PO TBCR
40.0000 meq | EXTENDED_RELEASE_TABLET | Freq: Once | ORAL | Status: AC
  Administered 2021-05-18: 40 meq via ORAL
  Filled 2021-05-18: qty 2

## 2021-05-18 MED ORDER — LOPERAMIDE HCL 2 MG PO CAPS
2.0000 mg | ORAL_CAPSULE | Freq: Four times a day (QID) | ORAL | Status: DC
  Administered 2021-05-18 – 2021-05-21 (×10): 2 mg via ORAL
  Filled 2021-05-18 (×12): qty 1

## 2021-05-18 NOTE — Evaluation (Signed)
Physical Therapy Evaluation Patient Details Name: Melanie Holloway MRN: [id number] DOB: 1966-06-06 Today's Date: 05/18/2021   History of Present Illness  55 y.o. female with PMH significant for lupus nephritis,, CKD stage IV, metastatic breast cancer, chronic systolic CHF, chronic anemia.    Patient presented to the ED on 7/27 with complaint of shortness of breath and cough progressively worsening for 2 weeks also associated with nausea, vomiting and diarrhea.  (+) Covid test.  Clinical Impression  On arrival pt reports she is not having nausea (had vomited multiple times earlier today) but still feeling generally weak.  She was willing to do some mobility and in-room ambulation and though she had some unsteadiness she did not need any direct assist with these.  She has general LE weakness, weaker on the R with some low grade buckling/unsteadiness during standing/WBing acts.  Pt will benefit from HHPT when medically ready for d/c.     Follow Up Recommendations Home health PT    Equipment Recommendations  None recommended by PT    Recommendations for Other Services       Precautions / Restrictions Precautions Precautions: Fall Restrictions Weight Bearing Restrictions: No      Mobility  Bed Mobility Overal bed mobility: Modified Independent             General bed mobility comments: needed bed rails, able to rise w/o assist    Transfers Overall transfer level: Needs assistance Equipment used: 4-wheeled walker Transfers: Sit to/from Stand Sit to Stand: Min assist         General transfer comment: Pt did not need phyiscal assist to rise, but clearly with some functional weakness in LEs, light buckling in R knee w/o LOB  Ambulation/Gait Ambulation/Gait assistance: Min guard Gait Distance (Feet): 60 Feet Assistive device: 4-wheeled walker       General Gait Details: Pt was able to do multiple small loops in the roo, leans forearms on the 4WW, ergo unable to effectively  use brakes, instructed/educated on appropraite setting and usage but she perfers the current/baseline setting...  Pt with some fatigue but no DOE or excessive HR increase.  No LOBs but with small scale buckling in R knee  Stairs            Wheelchair Mobility    Modified Rankin (Stroke Patients Only)       Balance Overall balance assessment: Needs assistance Sitting-balance support: No upper extremity supported Sitting balance-Leahy Scale: Good     Standing balance support: Bilateral upper extremity supported Standing balance-Leahy Scale: Fair Standing balance comment: did not have any LOBs with forearms on walker, but general unsteadiness w/o LOBs t/o standing acts                             Pertinent Vitals/Pain Pain Assessment:  (general stiffness)    Home Living Family/patient expects to be discharged to:: Private residence Living Arrangements: Spouse/significant other Available Help at Discharge: Available PRN/intermittently (husband at work t/o day but son helps daily for multiple hours)   Home Access: Ramped entrance       Home Equipment: Environmental consultant - 4 wheels;Cane - single point;Shower seat      Prior Function Level of Independence: Needs assistance   Gait / Transfers Assistance Needed: reports needing 4WW for the last month, apparently only intermittently prior to that.  Is able to get out of the home and do minimal community ambulaiton until recently  ADL's / Homemaking  Assistance Needed: family helps with bathing, dressing etc        Hand Dominance        Extremity/Trunk Assessment   Upper Extremity Assessment Upper Extremity Assessment: Generalized weakness    Lower Extremity Assessment Lower Extremity Assessment: Generalized weakness (R grossly 3-/5, L grossly 3+/5)       Communication   Communication: No difficulties  Cognition Arousal/Alertness: Awake/alert Behavior During Therapy: WFL for tasks assessed/performed Overall  Cognitive Status: Within Functional Limits for tasks assessed                                        General Comments      Exercises     Assessment/Plan    PT Assessment Patient needs continued PT services  PT Problem List Decreased strength;Decreased range of motion;Decreased activity tolerance;Decreased balance;Decreased mobility;Decreased knowledge of use of DME;Decreased safety awareness       PT Treatment Interventions DME instruction;Gait training;Functional mobility training;Therapeutic activities;Therapeutic exercise;Balance training;Cognitive remediation;Patient/family education    PT Goals (Current goals can be found in the Care Plan section)  Acute Rehab PT Goals Patient Stated Goal: go home PT Goal Formulation: With patient Time For Goal Achievement: 05/31/21 Potential to Achieve Goals: Good    Frequency Min 2X/week   Barriers to discharge        Co-evaluation               AM-PAC PT "6 Clicks" Mobility  Outcome Measure Help needed turning from your back to your side while in a flat bed without using bedrails?: None Help needed moving from lying on your back to sitting on the side of a flat bed without using bedrails?: None Help needed moving to and from a bed to a chair (including a wheelchair)?: None Help needed standing up from a chair using your arms (e.g., wheelchair or bedside chair)?: None Help needed to walk in hospital room?: A Little Help needed climbing 3-5 steps with a railing? : A Lot 6 Click Score: 21    End of Session Equipment Utilized During Treatment: Gait belt Activity Tolerance: Patient tolerated treatment well;Patient limited by fatigue Patient left: with bed alarm set;with call bell/phone within reach Nurse Communication: Mobility status PT Visit Diagnosis: Muscle weakness (generalized) (M62.81);Unsteadiness on feet (R26.81)    Time: 1540-1610 PT Time Calculation (min) (ACUTE ONLY): 30 min   Charges:   PT  Evaluation $PT Eval Low Complexity: 1 Low PT Treatments $Gait Training: 8-22 mins        Kreg Shropshire, DPT 05/18/2021, 5:49 PM

## 2021-05-18 NOTE — Progress Notes (Signed)
Patient has had 3 Ivs infiltrate within 24hrs. IV team was unable to place new IV. Night shift reported messaged on notified physician. Patient has a Port-a-cath but she report it has not been used or maintained since then. I do not feel it is safe to access without confirming placement and functional ability. Patient's maintenance fluids have been on hold until IV access is in place.

## 2021-05-18 NOTE — Progress Notes (Signed)
PROGRESS NOTE  Melanie Holloway  DOB: 04/20/1966  PCP: Ria Bush, MD DXI:[id number]  DOA: 05/14/2021  LOS: 4 days  Hospital Day: 5   Chief Complaint  Patient presents with   Shortness of Breath    Brief narrative: Melanie Holloway is a 55 y.o. female with PMH significant for lupus nephritis,, CKD stage IV, metastatic breast cancer, chronic systolic CHF, chronic anemia.   Patient presented to the ED on 7/27 with complaint of shortness of breath and cough progressively worsening for 2 weeks also associated with nausea, vomiting and diarrhea  In the ED, patient was afebrile but hypotensive at 85/64, tachycardia at 103, breathing 100% on room air. Labs showed WC count 2.3, hemoglobin 8.2, creatinine elevated to 2.52 however baseline of 1.89, potassium low at 2.7, magnesium low at 1.3 COVID PCR positive EKG with sinus tachycardia at 101 bpm, no ST-T wave changes CT chest, abdomen and pelvis showed bilateral lower lobe groundglass nodularity concerning for pneumonia, also showed mild colitis. Patient was admitted to hospital service for further evaluation management  Subjective: Patient was seen and examined this morning.   Looks much better than yesterday.  More awake.  Nausea improved.  Able to tolerate soft diet.  However still complains of 3-4 episodes of diarrhea in last 24 hours.  Assessment/Plan: COVID pneumonia Acute respiratory failure with hypoxia  -Presented with progressive worsening shortness of breath, cough, GI symptoms -COVID test: COVID PCR positive on admission -Chest imaging: CT chest on admission with bilateral pneumonia -Treatment: Currently on a 5-day course of IV remdesivir to complete on 7/31.  Breathing comfortably on room air.  Not on steroids. -Supportive care: Vitamin C, Zinc, PRN inhalers, Tylenol, Antitussives (benzonatate/ Mucinex/Tussionex).   -Encouraged incentive spirometry, prone position, out of bed and early mobilization as much as  possible -WBC and inflammatory markers trend as below.  Recent Labs  Lab 05/14/21 1535 05/14/21 1633 05/14/21 1921 05/14/21 2226 05/15/21 1347 05/16/21 0634 05/17/21 0637 05/18/21 7673  SARSCOV2NAA  --  POSITIVE*  --   --   --   --   --   --   WBC 2.3*  --   --   --  2.4* 2.3* 2.4* 2.5*  LATICACIDVEN 1.1  --  1.1  --   --   --   --   --   PROCALCITON  --   --   --  0.31  --   --   --   --   DDIMER  --   --   --   --  8.73* 11.71* 16.12* 15.46*  CRP  --   --   --   --  1.4* 1.7* 2.2*  --   ALT 11  --   --   --  11 11 11 12    Blood culture positive -Staph capitis grew in 1 out of 4 blood cultures sent on admission.  Likely contaminant.  Acute COVID-related gastroenteritis -Had nausea, vomiting and diarrhea at presentation. CT abdomen with findings of mild colitis and diarrheal stool -Low oral intake and diarrheal loss probably contributed to AKI and loss electrolytes. -Nausea improved.  Continues to have diarrhea 3-4 episodes of watery diarrhea in the last 24 hours.  Switch to Imodium scheduled.  Encourage oral hydration. -Continue soft diet.  AKI on CKD 4 Acute metabolic acidosis -Creatinine bicarbonate trend as below. -Creatinine was elevated to 3.52 on admission, from a baseline of 1.89 last month.  Improving gradually, 2.58 today.  Continue to monitor -Patient  continues to have metabolic acidosis, serum bicarb low at 14 today. -7/30, patient was started on sodium bicarb tablets.  Continue same. Recent Labs    01/22/21 0950 02/19/21 1412 03/11/21 0928 04/01/21 0931 04/22/21 1406 05/14/21 1535 05/15/21 1347 05/16/21 0634 05/17/21 0637 05/18/21 0608  BUN 50* 43* 64* 54* 79* 103* 98* 97* 86* 78*  CREATININE 1.69* 1.90* 1.96* 1.89* 2.45* 3.52* 2.51* 2.59* 2.64* 2.58*  CO2 16* 24 18* 16* 17* 14* 14* 14* 16* 14*   Hypokalemia -Potassium level improved with replacement but still low at 3.1.  Continue to replace. Recent Labs  Lab 05/14/21 1535 05/15/21 1347  05/16/21 0634 05/17/21 0637 05/18/21 0608  K 2.7* 3.1* 3.5 2.7* 3.1*  MG 1.3*  --   --  1.4*  --   PHOS  --   --   --  5.7*  --    History of systolic CHF Essential hypertension -Patient was initially hypotensive at presentation, blood pressure subsequently improved with hydration.   -EF in 2019 was reduced to 25 to 30%.  EF in last echo from April 2021 had improved to 70%. -Home meds include Toprol 50 mg daily, Entresto 24 mg / 26 mg daily, torsemide 40 mg twice daily. -Currently on metoprolol succinate at a reduced dose of 25 mg daily and Entresto.  Demadex remains on hold.     Metastatic breast cancer  -Was on Xeloda at home.  Previous hospitalist discussed with oncologist.  We will keep Xeloda for now. -Chest x-ray showing possible metastatic lesion on right proximal humerus. Continue to follow-up with oncology as an outpatient   lupus nephritis -Continue CellCept and Plaquenil   Pancytopenia -Likely due to immunosuppressants. -Trend as below.  Overall stable. Recent Labs  Lab 05/14/21 1535 05/15/21 1347 05/16/21 0634 05/16/21 2204 05/17/21 0637 05/18/21 0608  WBC 2.3* 2.4* 2.3*  --  2.4* 2.5*  NEUTROABS 1.6* 1.5* 1.4*  --  1.4* 1.5*  HGB 8.2* 8.3* 8.0* 8.9* 9.2* 7.9*  HCT 22.9* 23.7* 22.8* 24.8* 25.5* 24.2*  MCV 75.8* 78.2* 75.7*  --  74.1* 80.7  PLT 66* 69* 71*  --  67* 59*   Mobility: Encourage ambulation.  PT eval pending. Code Status:   Code Status: Full Code  Nutritional status: Body mass index is 25.85 kg/m.     Diet:  Diet Order             DIET SOFT Room service appropriate? Yes; Fluid consistency: Thin  Diet effective now                  DVT prophylaxis: Patient does not want Lovenox shots.  Start SCD boots Place and maintain sequential compression device Start: 05/17/21 0904 enoxaparin (LOVENOX) injection 30 mg Start: 05/14/21 2200   Antimicrobials: IV remdesivir Fluid: Currently not on IV fluid Consultants: Oncology Family  Communication: None at bedside  Status is: Inpatient  Remains inpatient appropriate because: Continues to need IV hydration, monitoring  Dispo: The patient is from: Home              Anticipated d/c is to: Home with home health most likely.  Pending PT eval              Patient currently is not medically stable to d/c.   Difficult to place patient No     Infusions:   sodium chloride 250 mL (05/16/21 1007)   promethazine (PHENERGAN) injection (IM or IVPB)     remdesivir 100 mg in NS 100  mL 200 mL/hr at 05/17/21 1324    Scheduled Meds:  albuterol  2 puff Inhalation Q6H   vitamin C  500 mg Oral Daily   enoxaparin (LOVENOX) injection  30 mg Subcutaneous Q24H   hydroxychloroquine  200 mg Oral Daily   loperamide  2 mg Oral Q6H   metoprolol succinate  25 mg Oral Daily   mycophenolate  500 mg Oral BID   sacubitril-valsartan  1 tablet Oral Daily   sodium bicarbonate  650 mg Oral BID   zinc sulfate  220 mg Oral Daily    Antimicrobials: Anti-infectives (From admission, onward)    Start     Dose/Rate Route Frequency Ordered Stop   05/15/21 1600  hydroxychloroquine (PLAQUENIL) tablet 200 mg        200 mg Oral Daily 05/15/21 1459     05/15/21 1000  remdesivir 100 mg in sodium chloride 0.9 % 100 mL IVPB  Status:  Discontinued       See Hyperspace for full Linked Orders Report.   100 mg 200 mL/hr over 30 Minutes Intravenous Daily 05/14/21 1958 05/14/21 2210   05/15/21 1000  remdesivir 100 mg in sodium chloride 0.9 % 100 mL IVPB       See Hyperspace for full Linked Orders Report.   100 mg 200 mL/hr over 30 Minutes Intravenous Daily 05/14/21 2210 05/19/21 0959   05/14/21 2315  remdesivir 100 mg in sodium chloride 0.9 % 100 mL IVPB       See Hyperspace for full Linked Orders Report.   100 mg 200 mL/hr over 30 Minutes Intravenous  Once 05/14/21 2210 05/15/21 0106   05/14/21 2215  remdesivir 100 mg in sodium chloride 0.9 % 100 mL IVPB       See Hyperspace for full Linked Orders Report.    100 mg 200 mL/hr over 30 Minutes Intravenous  Once 05/14/21 2210 05/15/21 0036   05/14/21 2100  remdesivir 200 mg in sodium chloride 0.9% 250 mL IVPB  Status:  Discontinued       See Hyperspace for full Linked Orders Report.   200 mg 580 mL/hr over 30 Minutes Intravenous Once 05/14/21 1958 05/14/21 2210   05/14/21 1915  cefTRIAXone (ROCEPHIN) 2 g in sodium chloride 0.9 % 100 mL IVPB  Status:  Discontinued        2 g 200 mL/hr over 30 Minutes Intravenous Every 24 hours 05/14/21 1905 05/15/21 1506   05/14/21 1915  azithromycin (ZITHROMAX) 500 mg in sodium chloride 0.9 % 250 mL IVPB  Status:  Discontinued        500 mg 250 mL/hr over 60 Minutes Intravenous Every 24 hours 05/14/21 1905 05/15/21 1506       PRN meds: sodium chloride, acetaminophen, alum & mag hydroxide-simeth, chlorpheniramine-HYDROcodone, guaiFENesin-dextromethorphan, ondansetron **OR** ondansetron (ZOFRAN) IV, promethazine (PHENERGAN) injection (IM or IVPB)   Objective: Vitals:   05/18/21 0800 05/18/21 1135  BP: 110/67 104/66  Pulse: 79 77  Resp: 18 18  Temp: 98 F (36.7 C) 98.1 F (36.7 C)  SpO2: 100%     Intake/Output Summary (Last 24 hours) at 05/18/2021 1354 Last data filed at 05/18/2021 0300 Gross per 24 hour  Intake 450.9 ml  Output --  Net 450.9 ml   Filed Weights   05/14/21 1529  Weight: 58.1 kg   Weight change:  Body mass index is 25.85 kg/m.   Physical Exam: General exam: Pleasant, middle-aged African-American female.  Looks more awake today.  Skin: No rashes, lesions or ulcers. HEENT:  Atraumatic, normocephalic, no obvious bleeding Lungs: Clear to auscultation bilaterally CVS: Regular rate and rhythm, no murmur GI/Abd soft, nontender, nondistended, bowel sound present CNS: Awake, alert, oriented to place and person.   Psychiatry: Depressed look Extremities: No pedal edema, no calf tenderness  Data Review: I have personally reviewed the laboratory data and studies available.  Recent  Labs  Lab 05/14/21 1535 05/15/21 1347 05/16/21 0634 05/16/21 2204 05/17/21 0637 05/18/21 0608  WBC 2.3* 2.4* 2.3*  --  2.4* 2.5*  NEUTROABS 1.6* 1.5* 1.4*  --  1.4* 1.5*  HGB 8.2* 8.3* 8.0* 8.9* 9.2* 7.9*  HCT 22.9* 23.7* 22.8* 24.8* 25.5* 24.2*  MCV 75.8* 78.2* 75.7*  --  74.1* 80.7  PLT 66* 69* 71*  --  67* 59*   Recent Labs  Lab 05/14/21 1535 05/15/21 1347 05/16/21 0634 05/17/21 0637 05/18/21 0608  NA 134* 139 143 142 137  K 2.7* 3.1* 3.5 2.7* 3.1*  CL 102 111 114* 113* 113*  CO2 14* 14* 14* 16* 14*  GLUCOSE 108* 109* 92 99 101*  BUN 103* 98* 97* 86* 78*  CREATININE 3.52* 2.51* 2.59* 2.64* 2.58*  CALCIUM 7.8* 7.8* 8.4* 8.5* 8.2*  MG 1.3*  --   --  1.4*  --   PHOS  --   --   --  5.7*  --     F/u labs ordered Unresulted Labs (From admission, onward)     Start     Ordered   05/21/21 0500  Creatinine, serum  (enoxaparin (LOVENOX)    CrCl < 30 ml/min)  Weekly,   STAT     Comments: while on enoxaparin therapy.    05/14/21 1958   05/18/21 0640  C-reactive protein  Once,   R        05/18/21 0640   05/15/21 0500  CBC with Differential/Platelet  Daily,   STAT      05/14/21 1958   05/15/21 0500  Comprehensive metabolic panel  Daily,   STAT      05/14/21 1958   05/15/21 0500  C-reactive protein  Daily,   STAT      05/14/21 1958   05/15/21 0500  D-dimer, quantitative  Daily,   STAT      05/14/21 1958            Signed, Terrilee Croak, MD [facility] 05/18/2021
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
## 2021-05-19 DIAGNOSIS — J1282 Pneumonia due to coronavirus disease 2019: Secondary | ICD-10-CM | POA: Diagnosis not present

## 2021-05-19 DIAGNOSIS — U071 COVID-19: Secondary | ICD-10-CM | POA: Diagnosis not present

## 2021-05-19 LAB — TYPE AND SCREEN
ABO/RH(D): B POS
Antibody Screen: POSITIVE
Donor AG Type: NEGATIVE
Unit division: 0
Unit division: 0
Unit division: 0

## 2021-05-19 LAB — COMPREHENSIVE METABOLIC PANEL
ALT: 14 U/L (ref 0–44)
AST: 34 U/L (ref 15–41)
Albumin: 2.6 g/dL — ABNORMAL LOW (ref 3.5–5.0)
Alkaline Phosphatase: 101 U/L (ref 38–126)
Anion gap: 11 (ref 5–15)
BUN: 70 mg/dL — ABNORMAL HIGH (ref 6–20)
CO2: 17 mmol/L — ABNORMAL LOW (ref 22–32)
Calcium: 8.6 mg/dL — ABNORMAL LOW (ref 8.9–10.3)
Chloride: 110 mmol/L (ref 98–111)
Creatinine, Ser: 2.75 mg/dL — ABNORMAL HIGH (ref 0.44–1.00)
GFR, Estimated: 20 mL/min — ABNORMAL LOW (ref >60)
Glucose, Bld: 120 mg/dL — ABNORMAL HIGH (ref 70–99)
Potassium: 3 mmol/L — ABNORMAL LOW (ref 3.5–5.1)
Sodium: 138 mmol/L (ref 135–145)
Total Bilirubin: 0.7 mg/dL (ref 0.3–1.2)
Total Protein: 5.1 g/dL — ABNORMAL LOW (ref 6.5–8.1)

## 2021-05-19 LAB — CBC WITH DIFFERENTIAL/PLATELET
Abs Immature Granulocytes: 0.02 10*3/uL (ref 0.00–0.07)
Basophils Absolute: 0 10*3/uL (ref 0.0–0.1)
Basophils Relative: 0 %
Eosinophils Absolute: 0 10*3/uL (ref 0.0–0.5)
Eosinophils Relative: 1 %
HCT: 21.9 % — ABNORMAL LOW (ref 36.0–46.0)
Hemoglobin: 7.9 g/dL — ABNORMAL LOW (ref 12.0–15.0)
Immature Granulocytes: 1 %
Lymphocytes Relative: 32 %
Lymphs Abs: 0.8 10*3/uL (ref 0.7–4.0)
MCH: 27.3 pg (ref 26.0–34.0)
MCHC: 36.1 g/dL — ABNORMAL HIGH (ref 30.0–36.0)
MCV: 75.8 fL — ABNORMAL LOW (ref 80.0–100.0)
Monocytes Absolute: 0.3 10*3/uL (ref 0.1–1.0)
Monocytes Relative: 13 %
Neutro Abs: 1.3 10*3/uL — ABNORMAL LOW (ref 1.7–7.7)
Neutrophils Relative %: 53 %
Platelets: 65 10*3/uL — ABNORMAL LOW (ref 150–400)
RBC: 2.89 MIL/uL — ABNORMAL LOW (ref 3.87–5.11)
RDW: 17.7 % — ABNORMAL HIGH (ref 11.5–15.5)
WBC: 2.4 10*3/uL — ABNORMAL LOW (ref 4.0–10.5)
nRBC: 0 % (ref 0.0–0.2)

## 2021-05-19 LAB — C-REACTIVE PROTEIN: CRP: 2.2 mg/dL — ABNORMAL HIGH (ref <1.0)

## 2021-05-19 LAB — BPAM RBC
Blood Product Expiration Date: 202208162359
Blood Product Expiration Date: 202208162359
Blood Product Expiration Date: 202208172359
ISSUE DATE / TIME: 202207291328
Unit Type and Rh: 5100
Unit Type and Rh: 9500
Unit Type and Rh: 9500

## 2021-05-19 LAB — CULTURE, BLOOD (ROUTINE X 2): Culture: NO GROWTH

## 2021-05-19 LAB — D-DIMER, QUANTITATIVE: D-Dimer, Quant: 14.56 ug/mL-FEU — ABNORMAL HIGH (ref 0.00–0.50)

## 2021-05-19 MED ORDER — POTASSIUM CHLORIDE CRYS ER 20 MEQ PO TBCR
40.0000 meq | EXTENDED_RELEASE_TABLET | Freq: Once | ORAL | Status: AC
  Administered 2021-05-19: 10:00:00 40 meq via ORAL
  Filled 2021-05-19: qty 2

## 2021-05-19 MED ORDER — SODIUM CHLORIDE 0.9 % IV SOLN: INTRAVENOUS | Status: DC

## 2021-05-19 MED ORDER — CIPROFLOXACIN HCL 0.3 % OP SOLN
1.0000 [drp] | OPHTHALMIC | Status: DC
  Administered 2021-05-19 – 2021-05-21 (×9): 1 [drp] via OPHTHALMIC
  Filled 2021-05-19: qty 2.5

## 2021-05-19 NOTE — Progress Notes (Signed)
PROGRESS NOTE  Melanie Holloway  DOB: 01-24-1966  PCP: Ria Bush, MD PJA:[id number]  DOA: 05/14/2021  LOS: 5 days  Hospital Day: 6   Chief Complaint  Patient presents with   Shortness of Breath    Brief narrative: Melanie Holloway is a 55 y.o. female with PMH significant for lupus nephritis,, CKD stage IV, metastatic breast cancer, chronic systolic CHF, chronic anemia.   Patient presented to the ED on 7/27 with complaint of shortness of breath and cough progressively worsening for 2 weeks also associated with nausea, vomiting and diarrhea  In the ED, patient was afebrile but hypotensive at 85/64, tachycardia at 103, breathing 100% on roomair. Labs showed WC count 2.3, hemoglobin 8.2, creatinine elevated to 2.52 however baseline of 1.89, potassium low at 2.7, magnesium low at 1.3 COVID PCR positive EKG with sinus tachycardia at 101 bpm, no ST-T wave changes CT chest, abdomen and pelvis showed bilateral lower lobe groundglass nodularity concerning for pneumonia, also showed mild colitis. Patient was admitted to hospital service for further evaluation management  Subjective: Patient was seen and examined this morning.   Sitting up in bed.  Feels better than yesterday.  Less episodes of diarrhea. Labs this morning with creatinine worsening.  Assessment/Plan: COVID pneumonia Acute respiratory failure with hypoxia  -Presented with progressive worsening shortness of breath, cough, GI symptoms -COVID test: COVID PCR positive on admission -Chest imaging: CT chest on admission with bilateral pneumonia -Treatment: Completed 5-day course of remdesivir on 7/13.  Not any steroids.  Breathing comfortably on room air. -Continue antitussives -COVID isolation continue. -WBC and inflammatory markers trend as below.  Recent Labs  Lab 05/14/21 1535 05/14/21 1633 05/14/21 1921 05/14/21 2226 05/15/21 1347 05/16/21 0634 05/17/21 0637 05/18/21 0608 05/18/21 0800 05/19/21 0538   SARSCOV2NAA  --  POSITIVE*  --   --   --   --   --   --   --   --   WBC 2.3*  --   --   --  2.4* 2.3* 2.4* 2.5*  --  2.4*  LATICACIDVEN 1.1  --  1.1  --   --   --   --   --   --   --   PROCALCITON  --   --   --  0.31  --   --   --   --   --   --   DDIMER  --   --   --   --  8.73* 11.71* 16.12* 15.46*  --  14.56*  CRP  --   --   --   --  1.4* 1.7* 2.2*  --  2.9* 2.2*  ALT 11  --   --   --  11 11 11 12   --  14   Blood culture positive -Staph capitis grew in 1 out of 4 blood cultures sent on admission.  Likely contaminant.  Acute COVID-related gastroenteritis -Had nausea, vomiting and diarrhea at presentation. CT abdomen with findings of mild colitis and diarrheal stool -Low oral intake and diarrheal loss probably contributed to AKI and loss electrolytes. -Nausea improved.  Diarrhea improving as well.  Encourage oral intake but with worsening creatinine, I think patient would also benefit from IV hydration.    AKI on CKD 4 Acute metabolic acidosis -Creatinine bicarbonate trend as below. -Creatinine was elevated to 3.52 on admission, from a baseline of 1.89 last month.  It was gradually improving but is up again today at 2.75.  Nephrology consultation called. -  Patient also continues to have metabolic acidosis, serum bicarb low at 14 today. -7/30, patient was started on sodium bicarb tablets.  Continue same. Recent Labs    02/19/21 1412 03/11/21 0928 04/01/21 0931 04/22/21 1406 05/14/21 1535 05/15/21 1347 05/16/21 0634 05/17/21 0637 05/18/21 0608 05/19/21 0538  BUN 43* 64* 54* 79* 103* 98* 97* 86* 78* 70*  CREATININE 1.90* 1.96* 1.89* 2.45* 3.52* 2.51* 2.59* 2.64* 2.58* 2.75*  CO2 24 18* 16* 17* 14* 14* 14* 16* 14* 17*   Hypokalemia -Potassium level remains low, 3 today.  More replacement given.   Recent Labs  Lab 05/14/21 1535 05/15/21 1347 05/16/21 0634 05/17/21 0637 05/18/21 0608 05/19/21 0538  K 2.7* 3.1* 3.5 2.7* 3.1* 3.0*  MG 1.3*  --   --  1.4*  --   --   PHOS   --   --   --  5.7*  --   --    History of systolic CHF Essential hypertension -Patient was initially hypotensive at presentation, blood pressure subsequently improved with hydration.   -EF in 2019 was reduced to 25 to 30%.  EF in last echo from April 2021 had improved to 70%. -Home meds include Toprol 50 mg daily, Entresto 24 mg / 26 mg daily, torsemide 40 mg twice daily. -Currently on metoprolol succinate at a reduced dose of 25 mg daily and Entresto.  Demadex remains on hold.  With worsening creatinine and low normal blood pressure, I would keep Entresto on hold.     Metastatic breast cancer  -Was on Xeloda at home.  Previous hospitalist discussed with oncologist.  We will keep Xeloda for now. -Chest x-ray showing possible metastatic lesion on right proximal humerus. Continue to follow-up with oncology as an outpatient   lupus nephritis -Continue CellCept and Plaquenil   Pancytopenia -Likely due to immunosuppressants. -Trend as below.  Overall stable. Recent Labs  Lab 05/15/21 1347 05/16/21 0634 05/16/21 2204 05/17/21 0637 05/18/21 0608 05/19/21 0538  WBC 2.4* 2.3*  --  2.4* 2.5* 2.4*  NEUTROABS 1.5* 1.4*  --  1.4* 1.5* 1.3*  HGB 8.3* 8.0* 8.9* 9.2* 7.9* 7.9*  HCT 23.7* 22.8* 24.8* 25.5* 24.2* 21.9*  MCV 78.2* 75.7*  --  74.1* 80.7 75.8*  PLT 69* 71*  --  67* 59* 65*   Mobility: Encourage ambulation.  PT eval pending. Code Status:   Code Status: Full Code  Nutritional status: Body mass index is 25.85 kg/m.     Diet:  Diet Order             Diet regular Room service appropriate? Yes; Fluid consistency: Thin  Diet effective now                  DVT prophylaxis: Patient does not want Lovenox shots.  Start SCD boots Place and maintain sequential compression device Start: 05/17/21 0904 enoxaparin (LOVENOX) injection 30 mg Start: 05/14/21 2200   Antimicrobials: IV remdesivir Fluid: Normal saline at 75 mill per hour Consultants: Oncology Family Communication:  None at bedside  Status is: Inpatient  Remains inpatient appropriate because: Continues to need IV hydration, monitoring  Dispo: The patient is from: Home              Anticipated d/c is to: Home with home health most likely.  Pending PT eval              Patient currently is not medically stable to d/c.   Difficult to place patient No     Infusions:  sodium chloride 250 mL (05/16/21 1007)   sodium chloride 75 mL/hr at 05/19/21 1238   sodium chloride     promethazine (PHENERGAN) injection (IM or IVPB)      Scheduled Meds:  albuterol  2 puff Inhalation Q6H   vitamin C  500 mg Oral Daily   ciprofloxacin  1 drop Both Eyes Q4H while awake   enoxaparin (LOVENOX) injection  30 mg Subcutaneous Q24H   hydroxychloroquine  200 mg Oral Daily   loperamide  2 mg Oral Q6H   metoprolol succinate  25 mg Oral Daily   mycophenolate  500 mg Oral BID   sodium bicarbonate  650 mg Oral BID   zinc sulfate  220 mg Oral Daily    Antimicrobials: Anti-infectives (From admission, onward)    Start     Dose/Rate Route Frequency Ordered Stop   05/15/21 1600  hydroxychloroquine (PLAQUENIL) tablet 200 mg        200 mg Oral Daily 05/15/21 1459     05/15/21 1000  remdesivir 100 mg in sodium chloride 0.9 % 100 mL IVPB  Status:  Discontinued       See Hyperspace for full Linked Orders Report.   100 mg 200 mL/hr over 30 Minutes Intravenous Daily 05/14/21 1958 05/14/21 2210   05/15/21 1000  remdesivir 100 mg in sodium chloride 0.9 % 100 mL IVPB       See Hyperspace for full Linked Orders Report.   100 mg 200 mL/hr over 30 Minutes Intravenous Daily 05/14/21 2210 05/19/21 0959   05/14/21 2315  remdesivir 100 mg in sodium chloride 0.9 % 100 mL IVPB       See Hyperspace for full Linked Orders Report.   100 mg 200 mL/hr over 30 Minutes Intravenous  Once 05/14/21 2210 05/15/21 0106   05/14/21 2215  remdesivir 100 mg in sodium chloride 0.9 % 100 mL IVPB       See Hyperspace for full Linked Orders Report.    100 mg 200 mL/hr over 30 Minutes Intravenous  Once 05/14/21 2210 05/15/21 0036   05/14/21 2100  remdesivir 200 mg in sodium chloride 0.9% 250 mL IVPB  Status:  Discontinued       See Hyperspace for full Linked Orders Report.   200 mg 580 mL/hr over 30 Minutes Intravenous Once 05/14/21 1958 05/14/21 2210   05/14/21 1915  cefTRIAXone (ROCEPHIN) 2 g in sodium chloride 0.9 % 100 mL IVPB  Status:  Discontinued        2 g 200 mL/hr over 30 Minutes Intravenous Every 24 hours 05/14/21 1905 05/15/21 1506   05/14/21 1915  azithromycin (ZITHROMAX) 500 mg in sodium chloride 0.9 % 250 mL IVPB  Status:  Discontinued        500 mg 250 mL/hr over 60 Minutes Intravenous Every 24 hours 05/14/21 1905 05/15/21 1506       PRN meds: sodium chloride, acetaminophen, alum & mag hydroxide-simeth, chlorpheniramine-HYDROcodone, guaiFENesin-dextromethorphan, ondansetron **OR** ondansetron (ZOFRAN) IV, promethazine (PHENERGAN) injection (IM or IVPB)   Objective: Vitals:   05/19/21 0927 05/19/21 1131  BP: 105/60 129/76  Pulse:  77  Resp:  18  Temp:  98.5 F (36.9 C)  SpO2:  100%   No intake or output data in the 24 hours ending 05/19/21 1305  Filed Weights   05/14/21 1529  Weight: 58.1 kg   Weight change:  Body mass index is 25.85 kg/m.   Physical Exam: General exam: Pleasant, middle-aged African-American female.  Looks more awake today.  Skin:  No rashes, lesions or ulcers. HEENT: Atraumatic, normocephalic, no obvious bleeding Lungs: Clear to auscultation bilaterally CVS: Regular rate and rhythm, no murmur GI/Abd soft, nontender, nondistended, bowel sound present CNS: Awake, alert, oriented to place and person.   Psychiatry: Depressed look Extremities: No pedal edema, no calf tenderness  Data Review: I have personally reviewed the laboratory data and studies available.  Recent Labs  Lab 05/15/21 1347 05/16/21 0634 05/16/21 2204 05/17/21 0637 05/18/21 0608 05/19/21 0538  WBC 2.4* 2.3*  --   2.4* 2.5* 2.4*  NEUTROABS 1.5* 1.4*  --  1.4* 1.5* 1.3*  HGB 8.3* 8.0* 8.9* 9.2* 7.9* 7.9*  HCT 23.7* 22.8* 24.8* 25.5* 24.2* 21.9*  MCV 78.2* 75.7*  --  74.1* 80.7 75.8*  PLT 69* 71*  --  67* 59* 65*   Recent Labs  Lab 05/14/21 1535 05/15/21 1347 05/16/21 0634 05/17/21 0637 05/18/21 0608 05/19/21 0538  NA 134* 139 143 142 137 138  K 2.7* 3.1* 3.5 2.7* 3.1* 3.0*  CL 102 111 114* 113* 113* 110  CO2 14* 14* 14* 16* 14* 17*  GLUCOSE 108* 109* 92 99 101* 120*  BUN 103* 98* 97* 86* 78* 70*  CREATININE 3.52* 2.51* 2.59* 2.64* 2.58* 2.75*  CALCIUM 7.8* 7.8* 8.4* 8.5* 8.2* 8.6*  MG 1.3*  --   --  1.4*  --   --   PHOS  --   --   --  5.7*  --   --     F/u labs ordered Unresulted Labs (From admission, onward)     Start     Ordered   05/21/21 0500  Creatinine, serum  (enoxaparin (LOVENOX)    CrCl < 30 ml/min)  Weekly,   STAT     Comments: while on enoxaparin therapy.    05/14/21 1958   05/20/21 0500  Magnesium  Tomorrow morning,   R       Question:  Specimen collection method  Answer:  Lab=Lab collect   05/19/21 0947   05/20/21 9163  Basic metabolic panel  Tomorrow morning,   R       Question:  Specimen collection method  Answer:  Lab=Lab collect   05/19/21 0948            Signed, Terrilee Croak, MD [facility] 05/19/2021

## 2021-05-19 NOTE — Progress Notes (Addendum)
Physical Therapy Treatment Patient Details Name: Melanie Holloway MRN: [id number] DOB: 03/19/1966 Today's Date: 05/19/2021    History of Present Illness 55 y.o. female with PMH significant for lupus nephritis,, CKD stage IV, metastatic breast cancer, chronic systolic CHF, chronic anemia.    Patient presented to the ED on 7/27 with complaint of shortness of breath and cough progressively worsening for 2 weeks also associated with nausea, vomiting and diarrhea.  (+) Covid test.    PT Comments    Pt tolerated treatment well today. Treatment focused on improving overall safety with functional mobility, strengthening, and activity tolerance. Pt able to improve activity tolerance, ambulation distance, and assistance levels since last session. Pt required grossly mod I for bed mobility, supervision for transfers, and supervision-CGA for gait in room with rollator. Despite progress, pt continues to be limited with meeting goals secondary to generalized weakness and decreased activity tolerance, noted by RPE of 4-6/10 indicating "moderate activity" after mobility. Pt able to complete toileting and a few BLE therex. Pt will continue to benefit from skilled acute PT services to address deficits for return to baseline function. Will continue to recommend DC home with HHPT.     Follow Up Recommendations  Home health PT     Equipment Recommendations  None recommended by PT    Recommendations for Other Services       Precautions / Restrictions Precautions Precautions: Fall Restrictions Weight Bearing Restrictions: No    Mobility  Bed Mobility Overal bed mobility: Modified Independent             General bed mobility comments: needed bed rails, able to rise w/o assist    Transfers Overall transfer level: Needs assistance Equipment used: 4-wheeled walker Transfers: Sit to/from Stand Sit to Stand: Min guard         General transfer comment: CGA to s perform STS transfers from [facility]  without AD, and from EOB with rollator. Increased time/effort to achieve full upright standing.  Ambulation/Gait Ambulation/Gait assistance: Min guard;Supervision Gait Distance (Feet): 80 Feet Assistive device: 4-wheeled walker       General Gait Details: Intermittent assist ranging from supervision-CGA for safety to ambulate in room with rollator. Pt demonstrates slowed cadence, forward flexed posture on rollator, decreased step length/foot clearance bil, and decreased hip ext bil. No LOB and no R knee buckling.      Balance Overall balance assessment: Needs assistance Sitting-balance support: No upper extremity supported Sitting balance-Leahy Scale: Good     Standing balance support: Bilateral upper extremity supported Standing balance-Leahy Scale: Fair Standing balance comment: BUE support on raolltor           Cognition Arousal/Alertness: Awake/alert;Lethargic Behavior During Therapy: WFL for tasks assessed/performed Overall Cognitive Status: Within Functional Limits for tasks assessed              General Comments: Able to follow 100% of multistep commands      Exercises Other Exercises Other Exercises: Pt able to participate in bed mobility, transfers, gait, and toileting with rollator and supervision-CGA for safety. Other Exercises: Pt able to perform x10 BLE therex including: seated marching, LAQ, and isometric hip ADD. Other Exercises: Pt educated regarding: PT role/POC, safety with mobility, benefits of OOB mobility/participation.        Pertinent Vitals/Pain Pain Assessment: No/denies pain     PT Goals (current goals can now be found in the care plan section) Acute Rehab PT Goals Patient Stated Goal: go home PT Goal Formulation: With patient Time For Goal  Achievement: 05/31/21 Potential to Achieve Goals: Good Progress towards PT goals: Progressing toward goals    Frequency    Min 2X/week      PT Plan Current plan remains appropriate        AM-PAC PT "6 Clicks" Mobility   Outcome Measure  Help needed turning from your back to your side while in a flat bed without using bedrails?: None Help needed moving from lying on your back to sitting on the side of a flat bed without using bedrails?: None Help needed moving to and from a bed to a chair (including a wheelchair)?: None Help needed standing up from a chair using your arms (e.g., wheelchair or bedside chair)?: None Help needed to walk in hospital room?: A Little Help needed climbing 3-5 steps with a railing? : A Lot 6 Click Score: 21    End of Session Equipment Utilized During Treatment: Gait belt Activity Tolerance: Patient tolerated treatment well;Patient limited by fatigue Patient left: with bed alarm set;with call bell/phone within reach Nurse Communication: Mobility status PT Visit Diagnosis: Muscle weakness (generalized) (M62.81);Unsteadiness on feet (R26.81)     Time: 1500-1530 PT Time Calculation (min) (ACUTE ONLY): 30 min  Charges:  $Therapeutic Exercise: 8-22 mins $Therapeutic Activity: 8-22 mins                     Herminio Commons, PT, DPT 3:51 PM,05/19/21

## 2021-05-19 NOTE — TOC Progression Note (Signed)
Transition of Care [facility]) - Progression Note    Patient Details  Name: Melanie Holloway MRN: [id number] Date of Birth: Sep 09, 1966  Transition of Care [facility]) CM/SW Contact  Shelbie Hutching, RN Phone Number: 05/19/2021, 4:27 PM  Clinical Narrative:    Patient is not yet medically stable for discharge.  MD anticipates another day or 2 in the hospital.  Patient will discharge with [facility] at discharge.    Expected Discharge Plan: [place] Barriers to Discharge: Continued Medical Work up  Expected Discharge Plan and Services Expected Discharge Plan: [place] arrangements for the past 2 months: Single Family Home                           HH Arranged: PT           Social Determinants of Health (SDOH) Interventions    Readmission Risk Interventions Readmission Risk Prevention Plan 05/16/2021  Transportation Screening Complete  PCP or Specialist Appt within 3-5 Days Complete  HRI or [place] Complete  Social Work Consult for [place] Planning/Counseling Complete  Palliative Care Screening Not Applicable  Medication Review [employer]) Complete  Some recent data might be hidden

## 2021-05-19 NOTE — Progress Notes (Addendum)
[facility]  ROUNDING NOTE   Subjective:   Melanie Holloway is a 55 y.o. female with past medical history including Lupus nephritis with CKD4, anemia, metastatic breast cancer, and CH. She presents to ED with complaints of cough, vomiting, diarrhea and shortness of breath. She has been admitted for Nausea [R11.0] AKI (acute kidney injury) ([place]) [N17.9] Malignant neoplasm of left female breast, unspecified estrogen receptor status, unspecified site of breast ([place]) [C50.912] Sepsis with acute organ dysfunction without septic shock, due to unspecified organism, unspecified type ([place]) [A41.9, R65.20] COVID [U07.1] Pneumonia due to COVID-19 virus [U07.1, J12.82]   Patient is currently seen by our practice. Patient resting in bed Alert and oriented Tolerating meals Denies nausea, vomiting and diarrhea Currently on room air, denies shortness of breath   Objective:  Vital signs in last 24 hours:  Temp:  [98 F (36.7 C)-98.6 F (37 C)] 98.5 F (36.9 C) (08/01 1131) Pulse Rate:  [72-79] 77 (08/01 1131) Resp:  [18-20] 18 (08/01 1131) BP: (105-129)/(60-76) 129/76 (08/01 1131) SpO2:  [100 %] 100 % (08/01 1131)  Weight change:  Filed Weights   05/14/21 1529  Weight: 58.1 kg    Intake/Output: I/O last 3 completed shifts: In: 169.8 [I.V.:69.8; IV Piggyback:100] Out: -    Intake/Output this shift:  No intake/output data recorded.  Physical Exam: General: NAD, laying in bed  Head: Normocephalic, atraumatic. Moist oral mucosal membranes  Eyes: Anicteric  Lungs:  Clear to auscultation, normal effot  Heart: Regular rate and rhythm  Abdomen:  Soft, nontender  Extremities:  no peripheral edema.  Neurologic: Nonfocal, moving all four extremities  Skin: No lesions       Basic Metabolic Panel: Recent Labs  Lab 05/14/21 1535 05/15/21 1347 05/16/21 0634 05/17/21 0637 05/18/21 0608 05/19/21 0538  NA 134* 139 143 142 137 138  K 2.7* 3.1* 3.5 2.7* 3.1* 3.0*  CL 102  111 114* 113* 113* 110  CO2 14* 14* 14* 16* 14* 17*  GLUCOSE 108* 109* 92 99 101* 120*  BUN 103* 98* 97* 86* 78* 70*  CREATININE 3.52* 2.51* 2.59* 2.64* 2.58* 2.75*  CALCIUM 7.8* 7.8* 8.4* 8.5* 8.2* 8.6*  MG 1.3*  --   --  1.4*  --   --   PHOS  --   --   --  5.7*  --   --     Liver Function Tests: Recent Labs  Lab 05/15/21 1347 05/16/21 0634 05/17/21 0637 05/18/21 0608 05/19/21 0538  AST 30 25 28 30  34  ALT 11 11 11 12 14   ALKPHOS 89 86 77 89 101  BILITOT 0.8 0.7 0.8 0.7 0.7  PROT 6.9 6.5 5.7* 4.9* 5.1*  ALBUMIN 3.4* 3.2* 3.0* 2.6* 2.6*   No results for input(s): LIPASE, AMYLASE in the last 168 hours. No results for input(s): AMMONIA in the last 168 hours.  CBC: Recent Labs  Lab 05/15/21 1347 05/16/21 0634 05/16/21 2204 05/17/21 0637 05/18/21 0608 05/19/21 0538  WBC 2.4* 2.3*  --  2.4* 2.5* 2.4*  NEUTROABS 1.5* 1.4*  --  1.4* 1.5* 1.3*  HGB 8.3* 8.0* 8.9* 9.2* 7.9* 7.9*  HCT 23.7* 22.8* 24.8* 25.5* 24.2* 21.9*  MCV 78.2* 75.7*  --  74.1* 80.7 75.8*  PLT 69* 71*  --  67* 59* 65*    Cardiac Enzymes: No results for input(s): CKTOTAL, CKMB, CKMBINDEX, TROPONINI in the last 168 hours.  BNP: Invalid input(s): POCBNP  CBG: Recent Labs  Lab 05/17/21 1730  GLUCAP 98  Microbiology: Results for orders placed or performed during the hospital encounter of 05/14/21  Resp Panel by RT-PCR (Flu A&B, Covid) Nasopharyngeal Swab     Status: Abnormal   Collection Time: 05/14/21  4:33 PM   Specimen: Nasopharyngeal Swab; Nasopharyngeal(NP) swabs in vial transport medium  Result Value Ref Range Status   SARS Coronavirus 2 by RT PCR POSITIVE (A) NEGATIVE Final    Comment: RESULT CALLED TO, READ BACK BY AND VERIFIED WITH: STEPHANIE RUDD @1822  05/14/21 MJU (NOTE) SARS-CoV-2 target nucleic acids are DETECTED.  The SARS-CoV-2 RNA is generally detectable in upper respiratory specimens during the acute phase of infection. Positive results are indicative of the presence of  the identified virus, but do not rule out bacterial infection or co-infection with other pathogens not detected by the test. Clinical correlation with patient history and other diagnostic information is necessary to determine patient infection status. The expected result is Negative.  Fact Sheet for Patients: EntrepreneurPulse.com.au  Fact Sheet for Healthcare Providers: IncredibleEmployment.be  This test is not yet approved or cleared by the [place] FDA and  has been authorized for detection and/or diagnosis of SARS-CoV-2 by FDA under an Emergency Use Authorization (EUA).  This EUA will remain in effect (meaning this test can be  used) for the duration of  the COVID-19 declaration under Section 564(b)(1) of the Act, 21 U.S.C. section 360bbb-3(b)(1), unless the authorization is terminated or revoked sooner.     Influenza A by PCR NEGATIVE NEGATIVE Final   Influenza B by PCR NEGATIVE NEGATIVE Final    Comment: (NOTE) The Xpert Xpress SARS-CoV-2/FLU/RSV plus assay is intended as an aid in the diagnosis of influenza from Nasopharyngeal swab specimens and should not be used as a sole basis for treatment. Nasal washings and aspirates are unacceptable for Xpert Xpress SARS-CoV-2/FLU/RSV testing.  Fact Sheet for Patients: EntrepreneurPulse.com.au  Fact Sheet for Healthcare Providers: IncredibleEmployment.be  This test is not yet approved or cleared by the [place] FDA and has been authorized for detection and/or diagnosis of SARS-CoV-2 by FDA under an Emergency Use Authorization (EUA). This EUA will remain in effect (meaning this test can be used) for the duration of the COVID-19 declaration under Section 564(b)(1) of the Act, 21 U.S.C. section 360bbb-3(b)(1), unless the authorization is terminated or revoked.  Performed at [facility], [address], [place], [place] [address]   Blood  Culture (routine x 2)     Status: Abnormal   Collection Time: 05/14/21  7:21 PM   Specimen: BLOOD  Result Value Ref Range Status   Specimen Description   Final    BLOOD RIGHT ANTECUBITAL Performed at [facility], [address] [place], [place] [address]    Special Requests   Final    BOTTLES DRAWN AEROBIC AND ANAEROBIC Blood Culture results may not be optimal due to an inadequate volume of blood received in culture bottles Performed at [facility], [address] [place], [place] [address]    Culture  Setup Time   Final    GRAM POSITIVE COCCI IN BOTH AEROBIC AND ANAEROBIC BOTTLES CRITICAL RESULT CALLED TO, READ BACK BY AND VERIFIED WITH: NATHAN BLUE@0101  05/16/21 RH    Culture (A)  Final    STAPHYLOCOCCUS CAPITIS THE SIGNIFICANCE OF ISOLATING THIS ORGANISM FROM A SINGLE SET OF BLOOD CULTURES WHEN MULTIPLE SETS ARE DRAWN IS UNCERTAIN. PLEASE NOTIFY THE MICROBIOLOGY DEPARTMENT WITHIN ONE WEEK IF SPECIATION AND SENSITIVITIES ARE REQUIRED. Performed at [facility], [place] [address],  [place], [place] [address]    Report Status 05/18/2021 FINAL  Final  Blood Culture (routine x 2)     Status: None   Collection Time: 05/14/21  7:21 PM   Specimen: BLOOD  Result Value Ref Range Status   Specimen Description BLOOD LEFT ANTECUBITAL  Final   Special Requests   Final    BOTTLES DRAWN AEROBIC AND ANAEROBIC Blood Culture results may not be optimal due to an inadequate volume of blood received in culture bottles   Culture   Final    NO GROWTH 5 DAYS Performed at [facility], [address] [place], [place] [address]    Report Status 05/19/2021 FINAL  Final  Blood Culture ID Panel (Reflexed)     Status: Abnormal   Collection Time: 05/14/21  7:21 PM  Result Value Ref Range Status   Enterococcus faecalis NOT DETECTED NOT DETECTED Final   Enterococcus Faecium NOT DETECTED NOT DETECTED Final   Listeria monocytogenes NOT DETECTED NOT DETECTED Final    Staphylococcus species DETECTED (A) NOT DETECTED Final    Comment: CRITICAL RESULT CALLED TO, READ BACK BY AND VERIFIED WITH: NATHAN BLUE @0101  05/16/21 RH    Staphylococcus aureus (BCID) NOT DETECTED NOT DETECTED Final   Staphylococcus epidermidis NOT DETECTED NOT DETECTED Final   Staphylococcus lugdunensis NOT DETECTED NOT DETECTED Final   Streptococcus species NOT DETECTED NOT DETECTED Final   Streptococcus agalactiae NOT DETECTED NOT DETECTED Final   Streptococcus pneumoniae NOT DETECTED NOT DETECTED Final   Streptococcus pyogenes NOT DETECTED NOT DETECTED Final   A.calcoaceticus-baumannii NOT DETECTED NOT DETECTED Final   Bacteroides fragilis NOT DETECTED NOT DETECTED Final   Enterobacterales NOT DETECTED NOT DETECTED Final   Enterobacter cloacae complex NOT DETECTED NOT DETECTED Final   Escherichia coli NOT DETECTED NOT DETECTED Final   Klebsiella aerogenes NOT DETECTED NOT DETECTED Final   Klebsiella oxytoca NOT DETECTED NOT DETECTED Final   Klebsiella pneumoniae NOT DETECTED NOT DETECTED Final   Proteus species NOT DETECTED NOT DETECTED Final   Salmonella species NOT DETECTED NOT DETECTED Final   Serratia marcescens NOT DETECTED NOT DETECTED Final   Haemophilus influenzae NOT DETECTED NOT DETECTED Final   Neisseria meningitidis NOT DETECTED NOT DETECTED Final   Pseudomonas aeruginosa NOT DETECTED NOT DETECTED Final   Stenotrophomonas maltophilia NOT DETECTED NOT DETECTED Final   Candida albicans NOT DETECTED NOT DETECTED Final   Candida auris NOT DETECTED NOT DETECTED Final   Candida glabrata NOT DETECTED NOT DETECTED Final   Candida krusei NOT DETECTED NOT DETECTED Final   Candida parapsilosis NOT DETECTED NOT DETECTED Final   Candida tropicalis NOT DETECTED NOT DETECTED Final   Cryptococcus neoformans/gattii NOT DETECTED NOT DETECTED Final    Comment: Performed at [facility], [address] [place], [place] [address]  CULTURE, BLOOD (ROUTINE X 2) w Reflex to ID  Panel     Status: None (Preliminary result)   Collection Time: 05/16/21 10:13 PM   Specimen: BLOOD  Result Value Ref Range Status   Specimen Description BLOOD BLOOD RIGHT WRIST  Final   Special Requests   Final    IN PEDIATRIC BOTTLE Blood Culture results may not be optimal due to an excessive volume of blood received in culture bottles   Culture   Final    NO GROWTH 3 DAYS Performed at [facility], [address] [place], [place] [address]    Report Status PENDING  Incomplete  CULTURE, BLOOD (ROUTINE X 2) w Reflex to ID Panel  Status: None (Preliminary result)   Collection Time: 05/16/21 10:13 PM   Specimen: BLOOD  Result Value Ref Range Status   Specimen Description BLOOD BLOOD RIGHT HAND  Final   Special Requests   Final    IN PEDIATRIC BOTTLE Blood Culture results may not be optimal due to an excessive volume of blood received in culture bottles   Culture   Final    NO GROWTH 3 DAYS Performed at [facility], [address] [place], [place] [address]    Report Status PENDING  Incomplete    Coagulation Studies: No results for input(s): LABPROT, INR in the last 72 hours.  Urinalysis: No results for input(s): COLORURINE, LABSPEC, PHURINE, GLUCOSEU, HGBUR, BILIRUBINUR, KETONESUR, PROTEINUR, UROBILINOGEN, NITRITE, LEUKOCYTESUR in the last 72 hours.  Invalid input(s): APPERANCEUR    Imaging: No results found.   Medications:    sodium chloride 250 mL (05/16/21 1007)   sodium chloride     promethazine (PHENERGAN) injection (IM or IVPB)      albuterol  2 puff Inhalation Q6H   vitamin C  500 mg Oral Daily   enoxaparin (LOVENOX) injection  30 mg Subcutaneous Q24H   hydroxychloroquine  200 mg Oral Daily   loperamide  2 mg Oral Q6H   metoprolol succinate  25 mg Oral Daily   mycophenolate  500 mg Oral BID   sodium bicarbonate  650 mg Oral BID   zinc sulfate  220 mg Oral Daily   sodium chloride, acetaminophen, alum & mag hydroxide-simeth,  chlorpheniramine-HYDROcodone, guaiFENesin-dextromethorphan, ondansetron **OR** ondansetron (ZOFRAN) IV, promethazine (PHENERGAN) injection (IM or IVPB)  Assessment/ Plan:  Ms. BRITTANYA WINBURN is a 55 y.o.  female with past medical history including Lupus nephritis with CKD4, anemia, metastatic breast cancer, and CH. She presents to ED with complaints of cough, vomiting, diarrhea and shortness of breath. She has been admitted for Nausea [R11.0] AKI (acute kidney injury) ([place]) [N17.9] Malignant neoplasm of left female breast, unspecified estrogen receptor status, unspecified site of breast ([place]) [C50.912] Sepsis with acute organ dysfunction without septic shock, due to unspecified organism, unspecified type ([place]) [A41.9, R65.20] COVID [U07.1] Pneumonia due to COVID-19 virus [U07.1, J12.82]   Acute Kidney Injury on chronic kidney disease stage 4 with baseline creatinine 2.45 and GFR of 23 on 04/22/21.  Acute kidney injury secondary to ATN Acute kidney injury is complicated by heart failure No IV contrast exposure No indication for dialysis at this time  Will order 1L NS IVF gently, @ 50ml/hr Will monitor closely  Lab Results  Component Value Date   CREATININE 2.75 (H) 05/19/2021   CREATININE 2.58 (H) 05/18/2021   CREATININE 2.64 (H) 05/17/2021   No intake or output data in the 24 hours ending 05/19/21 1237  2. Anemia of chronic kidney disease  Lab Results  Component Value Date   HGB 7.9 (L) 05/19/2021  Hgb below target Will continue to monitor  Secondary Hyperparathyroidism:    Lab Results  Component Value Date   PTH 47 01/16/2020   PTH Comment 01/16/2020   CALCIUM 8.6 (L) 05/19/2021   PHOS 5.7 (H) 05/17/2021     Phosphorus and calcium not at goal Continue to monitor   LOS: [place] 8/1/202212:37 PM

## 2021-05-20 DIAGNOSIS — J1282 Pneumonia due to coronavirus disease 2019: Secondary | ICD-10-CM | POA: Diagnosis not present

## 2021-05-20 DIAGNOSIS — U071 COVID-19: Secondary | ICD-10-CM | POA: Diagnosis not present

## 2021-05-20 LAB — BASIC METABOLIC PANEL
Anion gap: 5 (ref 5–15)
BUN: 58 mg/dL — ABNORMAL HIGH (ref 6–20)
CO2: 17 mmol/L — ABNORMAL LOW (ref 22–32)
Calcium: 8.2 mg/dL — ABNORMAL LOW (ref 8.9–10.3)
Chloride: 113 mmol/L — ABNORMAL HIGH (ref 98–111)
Creatinine, Ser: 2.42 mg/dL — ABNORMAL HIGH (ref 0.44–1.00)
GFR, Estimated: 23 mL/min — ABNORMAL LOW (ref >60)
Glucose, Bld: 87 mg/dL (ref 70–99)
Potassium: 3.6 mmol/L (ref 3.5–5.1)
Sodium: 135 mmol/L (ref 135–145)

## 2021-05-20 LAB — MAGNESIUM: Magnesium: 2.2 mg/dL (ref 1.7–2.4)

## 2021-05-20 NOTE — Progress Notes (Signed)
Upon entering patient's room to give meds, patient noted to be lying diagonal across bed. Pt stated she "couldn't move any farther." Pillow moved up and bed and patient easily readjusted herself in bed. Asked patient if she took her medications crushed, as was stated in report to which patient replied "sometimes.Marland Kitchen  depends on what it is." Read of medications to patient. No response given. Patient then asked if she would like them crushed. Patient stated, "no, can't you heard?!" Patient then asked if she wanted them whole in applesauce. Patient replied, "just give them to me!"  Explained to patient that orders for IV fluids were in place and IV access was needed. Current IV infiltrated overnight, but patient had continued to refuse it's removal. Pt became very argumentative and irritable.  Nephrology NP at bedside. Notified of refusal for new PIV. Patient eventually agreeable to removal of infiltrated PIV, but continued to refuse new PIV.  When asked if there was anything else she needed assistance with, patient stated, "Are you a nurse or a doctor?" I replied that I was her nurse and had met her earlier this morning. To which patient stated, "So you don't know anything...you can leave."

## 2021-05-20 NOTE — Progress Notes (Signed)
Patient refusing bed alarm at this time. Patient states she knows she is at risk for fall and "does not care."

## 2021-05-20 NOTE — Progress Notes (Signed)
[facility]  ROUNDING NOTE   Subjective:   Melanie Holloway is a 55 y.o. female with past medical history including Lupus nephritis with CKD4, anemia, metastatic breast cancer, and CH. She presents to ED with complaints of cough, vomiting, diarrhea and shortness of breath. She has been admitted for Nausea [R11.0] AKI (acute kidney injury) ([place]) [N17.9] Malignant neoplasm of left female breast, unspecified estrogen receptor status, unspecified site of breast ([place]) [C50.912] Sepsis with acute organ dysfunction without septic shock, due to unspecified organism, unspecified type ([place]) [A41.9, R65.20] COVID [U07.1] Pneumonia due to COVID-19 virus [U07.1, J12.82]   Patient is currently seen by our practice.   Patient seen and evaluated at bedside Alert, irritable Tolerating meals, denies GI symptoms Per nursing, PIV access has been unsuccessful Patient frustrated with amount of attempts   Objective:  Vital signs in last 24 hours:  Temp:  [97.3 F (36.3 C)-98.5 F (36.9 C)] 98 F (36.7 C) (08/02 0700) Pulse Rate:  [75-80] 78 (08/02 0700) Resp:  [18-20] 18 (08/02 0700) BP: (111-129)/(66-76) 119/70 (08/02 0700) SpO2:  [100 %] 100 % (08/02 0700)  Weight change:  Filed Weights   05/14/21 1529  Weight: 58.1 kg    Intake/Output: I/O last 3 completed shifts: In: 1429.7 [I.V.:1429.7] Out: -    Intake/Output this shift:  No intake/output data recorded.  Physical Exam: General: NAD, sitting up in bed  Head: Normocephalic, atraumatic. Moist oral mucosal membranes  Eyes: Anicteric  Lungs:  Clear to auscultation, normal effot  Heart: Regular rate and rhythm  Abdomen:  Soft, nontender  Extremities:  no peripheral edema.  Neurologic: Nonfocal, moving all four extremities  Skin: No lesions       Basic Metabolic Panel: Recent Labs  Lab 05/14/21 1535 05/15/21 1347 05/16/21 0634 05/17/21 0637 05/18/21 0608 05/19/21 0538 05/20/21 0556  NA 134*   < > 143 142 137  138 135  K 2.7*   < > 3.5 2.7* 3.1* 3.0* 3.6  CL 102   < > 114* 113* 113* 110 113*  CO2 14*   < > 14* 16* 14* 17* 17*  GLUCOSE 108*   < > 92 99 101* 120* 87  BUN 103*   < > 97* 86* 78* 70* 58*  CREATININE 3.52*   < > 2.59* 2.64* 2.58* 2.75* 2.42*  CALCIUM 7.8*   < > 8.4* 8.5* 8.2* 8.6* 8.2*  MG 1.3*  --   --  1.4*  --   --  2.2  PHOS  --   --   --  5.7*  --   --   --    < > = values in this interval not displayed.     Liver Function Tests: Recent Labs  Lab 05/15/21 1347 05/16/21 0634 05/17/21 0637 05/18/21 0608 05/19/21 0538  AST 30 25 28 30  34  ALT 11 11 11 12 14   ALKPHOS 89 86 77 89 101  BILITOT 0.8 0.7 0.8 0.7 0.7  PROT 6.9 6.5 5.7* 4.9* 5.1*  ALBUMIN 3.4* 3.2* 3.0* 2.6* 2.6*    No results for input(s): LIPASE, AMYLASE in the last 168 hours. No results for input(s): AMMONIA in the last 168 hours.  CBC: Recent Labs  Lab 05/15/21 1347 05/16/21 0634 05/16/21 2204 05/17/21 0637 05/18/21 0608 05/19/21 0538  WBC 2.4* 2.3*  --  2.4* 2.5* 2.4*  NEUTROABS 1.5* 1.4*  --  1.4* 1.5* 1.3*  HGB 8.3* 8.0* 8.9* 9.2* 7.9* 7.9*  HCT 23.7* 22.8* 24.8* 25.5* 24.2*  21.9*  MCV 78.2* 75.7*  --  74.1* 80.7 75.8*  PLT 69* 71*  --  67* 59* 65*     Cardiac Enzymes: No results for input(s): CKTOTAL, CKMB, CKMBINDEX, TROPONINI in the last 168 hours.  BNP: Invalid input(s): POCBNP  CBG: Recent Labs  Lab 05/17/21 1730  GLUCAP 98     Microbiology: Results for orders placed or performed during the hospital encounter of 05/14/21  Resp Panel by RT-PCR (Flu A&B, Covid) Nasopharyngeal Swab     Status: Abnormal   Collection Time: 05/14/21  4:33 PM   Specimen: Nasopharyngeal Swab; Nasopharyngeal(NP) swabs in vial transport medium  Result Value Ref Range Status   SARS Coronavirus 2 by RT PCR POSITIVE (A) NEGATIVE Final    Comment: RESULT CALLED TO, READ BACK BY AND VERIFIED WITH: STEPHANIE RUDD @1822  05/14/21 MJU (NOTE) SARS-CoV-2 target nucleic acids are DETECTED.  The  SARS-CoV-2 RNA is generally detectable in upper respiratory specimens during the acute phase of infection. Positive results are indicative of the presence of the identified virus, but do not rule out bacterial infection or co-infection with other pathogens not detected by the test. Clinical correlation with patient history and other diagnostic information is necessary to determine patient infection status. The expected result is Negative.  Fact Sheet for Patients: EntrepreneurPulse.com.au  Fact Sheet for Healthcare Providers: IncredibleEmployment.be  This test is not yet approved or cleared by the [place] FDA and  has been authorized for detection and/or diagnosis of SARS-CoV-2 by FDA under an Emergency Use Authorization (EUA).  This EUA will remain in effect (meaning this test can be  used) for the duration of  the COVID-19 declaration under Section 564(b)(1) of the Act, 21 U.S.C. section 360bbb-3(b)(1), unless the authorization is terminated or revoked sooner.     Influenza A by PCR NEGATIVE NEGATIVE Final   Influenza B by PCR NEGATIVE NEGATIVE Final    Comment: (NOTE) The Xpert Xpress SARS-CoV-2/FLU/RSV plus assay is intended as an aid in the diagnosis of influenza from Nasopharyngeal swab specimens and should not be used as a sole basis for treatment. Nasal washings and aspirates are unacceptable for Xpert Xpress SARS-CoV-2/FLU/RSV testing.  Fact Sheet for Patients: EntrepreneurPulse.com.au  Fact Sheet for Healthcare Providers: IncredibleEmployment.be  This test is not yet approved or cleared by the [place] FDA and has been authorized for detection and/or diagnosis of SARS-CoV-2 by FDA under an Emergency Use Authorization (EUA). This EUA will remain in effect (meaning this test can be used) for the duration of the COVID-19 declaration under Section 564(b)(1) of the Act, 21 U.S.C. section  360bbb-3(b)(1), unless the authorization is terminated or revoked.  Performed at [facility], [address], [place], [place] [address]   Blood Culture (routine x 2)     Status: Abnormal   Collection Time: 05/14/21  7:21 PM   Specimen: BLOOD  Result Value Ref Range Status   Specimen Description   Final    BLOOD RIGHT ANTECUBITAL Performed at [facility], [address] [place], [place] [address]    Special Requests   Final    BOTTLES DRAWN AEROBIC AND ANAEROBIC Blood Culture results may not be optimal due to an inadequate volume of blood received in culture bottles Performed at [facility], [address] [place], [place] [address]    Culture  Setup Time   Final    GRAM POSITIVE COCCI IN BOTH AEROBIC AND ANAEROBIC BOTTLES CRITICAL RESULT CALLED TO, READ BACK BY AND VERIFIED WITH:  NATHAN BLUE@0101  05/16/21 RH    Culture (A)  Final    STAPHYLOCOCCUS CAPITIS THE SIGNIFICANCE OF ISOLATING THIS ORGANISM FROM A SINGLE SET OF BLOOD CULTURES WHEN MULTIPLE SETS ARE DRAWN IS UNCERTAIN. PLEASE NOTIFY THE MICROBIOLOGY DEPARTMENT WITHIN ONE WEEK IF SPECIATION AND SENSITIVITIES ARE REQUIRED. Performed at [facility], [place] [address] [place], [place] [address]    Report Status 05/18/2021 FINAL  Final  Blood Culture (routine x 2)     Status: None   Collection Time: 05/14/21  7:21 PM   Specimen: BLOOD  Result Value Ref Range Status   Specimen Description BLOOD LEFT ANTECUBITAL  Final   Special Requests   Final    BOTTLES DRAWN AEROBIC AND ANAEROBIC Blood Culture results may not be optimal due to an inadequate volume of blood received in culture bottles   Culture   Final    NO GROWTH 5 DAYS Performed at [facility], [address] [place], [place] [address]    Report Status 05/19/2021 FINAL  Final  Blood Culture ID Panel (Reflexed)     Status: Abnormal   Collection Time: 05/14/21  7:21 PM  Result Value Ref Range Status   Enterococcus  faecalis NOT DETECTED NOT DETECTED Final   Enterococcus Faecium NOT DETECTED NOT DETECTED Final   Listeria monocytogenes NOT DETECTED NOT DETECTED Final   Staphylococcus species DETECTED (A) NOT DETECTED Final    Comment: CRITICAL RESULT CALLED TO, READ BACK BY AND VERIFIED WITH: NATHAN BLUE @0101  05/16/21 RH    Staphylococcus aureus (BCID) NOT DETECTED NOT DETECTED Final   Staphylococcus epidermidis NOT DETECTED NOT DETECTED Final   Staphylococcus lugdunensis NOT DETECTED NOT DETECTED Final   Streptococcus species NOT DETECTED NOT DETECTED Final   Streptococcus agalactiae NOT DETECTED NOT DETECTED Final   Streptococcus pneumoniae NOT DETECTED NOT DETECTED Final   Streptococcus pyogenes NOT DETECTED NOT DETECTED Final   A.calcoaceticus-baumannii NOT DETECTED NOT DETECTED Final   Bacteroides fragilis NOT DETECTED NOT DETECTED Final   Enterobacterales NOT DETECTED NOT DETECTED Final   Enterobacter cloacae complex NOT DETECTED NOT DETECTED Final   Escherichia coli NOT DETECTED NOT DETECTED Final   Klebsiella aerogenes NOT DETECTED NOT DETECTED Final   Klebsiella oxytoca NOT DETECTED NOT DETECTED Final   Klebsiella pneumoniae NOT DETECTED NOT DETECTED Final   Proteus species NOT DETECTED NOT DETECTED Final   Salmonella species NOT DETECTED NOT DETECTED Final   Serratia marcescens NOT DETECTED NOT DETECTED Final   Haemophilus influenzae NOT DETECTED NOT DETECTED Final   Neisseria meningitidis NOT DETECTED NOT DETECTED Final   Pseudomonas aeruginosa NOT DETECTED NOT DETECTED Final   Stenotrophomonas maltophilia NOT DETECTED NOT DETECTED Final   Candida albicans NOT DETECTED NOT DETECTED Final   Candida auris NOT DETECTED NOT DETECTED Final   Candida glabrata NOT DETECTED NOT DETECTED Final   Candida krusei NOT DETECTED NOT DETECTED Final   Candida parapsilosis NOT DETECTED NOT DETECTED Final   Candida tropicalis NOT DETECTED NOT DETECTED Final   Cryptococcus neoformans/gattii NOT DETECTED  NOT DETECTED Final    Comment: Performed at [facility], [address] [place], [place] [address]  CULTURE, BLOOD (ROUTINE X 2) w Reflex to ID Panel     Status: None (Preliminary result)   Collection Time: 05/16/21 10:13 PM   Specimen: BLOOD  Result Value Ref Range Status   Specimen Description BLOOD BLOOD RIGHT WRIST  Final   Special Requests   Final    IN PEDIATRIC BOTTLE Blood Culture  results may not be optimal due to an excessive volume of blood received in culture bottles   Culture   Final    NO GROWTH 4 DAYS Performed at [facility], [address] [place], [place] [address]    Report Status PENDING  Incomplete  CULTURE, BLOOD (ROUTINE X 2) w Reflex to ID Panel     Status: None (Preliminary result)   Collection Time: 05/16/21 10:13 PM   Specimen: BLOOD  Result Value Ref Range Status   Specimen Description BLOOD BLOOD RIGHT HAND  Final   Special Requests   Final    IN PEDIATRIC BOTTLE Blood Culture results may not be optimal due to an excessive volume of blood received in culture bottles   Culture   Final    NO GROWTH 4 DAYS Performed at [facility], [address], [place], [place] [address]    Report Status PENDING  Incomplete    Coagulation Studies: No results for input(s): LABPROT, INR in the last 72 hours.  Urinalysis: No results for input(s): COLORURINE, LABSPEC, PHURINE, GLUCOSEU, HGBUR, BILIRUBINUR, KETONESUR, PROTEINUR, UROBILINOGEN, NITRITE, LEUKOCYTESUR in the last 72 hours.  Invalid input(s): APPERANCEUR    Imaging: No results found.   Medications:    sodium chloride 250 mL (05/16/21 1007)   sodium chloride Stopped (05/20/21 0636)   promethazine (PHENERGAN) injection (IM or IVPB)      albuterol  2 puff Inhalation Q6H   vitamin C  500 mg Oral Daily   ciprofloxacin  1 drop Both Eyes Q4H while awake   enoxaparin (LOVENOX) injection  30 mg Subcutaneous Q24H   hydroxychloroquine  200 mg Oral Daily   loperamide  2 mg  Oral Q6H   metoprolol succinate  25 mg Oral Daily   mycophenolate  500 mg Oral BID   sodium bicarbonate  650 mg Oral BID   zinc sulfate  220 mg Oral Daily   sodium chloride, acetaminophen, alum & mag hydroxide-simeth, chlorpheniramine-HYDROcodone, guaiFENesin-dextromethorphan, ondansetron **OR** ondansetron (ZOFRAN) IV, promethazine (PHENERGAN) injection (IM or IVPB)  Assessment/ Plan:  [name] is a 55 y.o.  female with past medical history including Lupus nephritis with CKD4, anemia, metastatic breast cancer, and CH. She presents to ED with complaints of cough, vomiting, diarrhea and shortness of breath. She has been admitted for Nausea [R11.0] AKI (acute kidney injury) ([place]) [N17.9] Malignant neoplasm of left female breast, unspecified estrogen receptor status, unspecified site of breast ([place]) [C50.912] Sepsis with acute organ dysfunction without septic shock, due to unspecified organism, unspecified type ([place]) [A41.9, R65.20] COVID [U07.1] Pneumonia due to COVID-19 virus [U07.1, J12.82]   Acute Kidney Injury on chronic kidney disease stage 4 with baseline creatinine 2.45 and GFR of 23 on 04/22/21.  Acute kidney injury secondary to ATN Acute kidney injury is complicated by heart failure No IV contrast exposure No indication for dialysis at this time  IVF were not given due to lack of PIV Patient maintains good appetite, continue to encourage Creatinine has improved to baseline Will monitor closely  Lab Results  Component Value Date   CREATININE 2.42 (H) 05/20/2021   CREATININE 2.75 (H) 05/19/2021   CREATININE 2.58 (H) 05/18/2021    Intake/Output Summary (Last 24 hours) at 05/20/2021 0950 Last data filed at 05/20/2021 0636 Gross per 24 hour  Intake 1429.71 ml  Output --  Net 1429.71 ml    2. Anemia of chronic kidney disease  Lab Results  Component Value Date   HGB 7.9 (L) 05/19/2021  Hgb not at goal Will monitor  Secondary Hyperparathyroidism:    Lab Results   Component Value Date   PTH 47 01/16/2020   PTH Comment 01/16/2020   CALCIUM 8.2 (L) 05/20/2021   PHOS 5.7 (H) 05/17/2021     Phosphorus and calcium not at goal Continue to monitor   LOS: 6 Rayonna Heldman 8/2/20229:50 AM

## 2021-05-20 NOTE — Progress Notes (Signed)
PROGRESS NOTE  Melanie Holloway  DOB: November 05, 1965  PCP: Ria Bush, MD STM:[id number]  DOA: 05/14/2021  LOS: 6 days  Hospital Day: 7   Chief Complaint  Patient presents with   Shortness of Breath    Brief narrative: Melanie Holloway is a 55 y.o. female with PMH significant for lupus nephritis,, CKD stage IV, metastatic breast cancer, chronic systolic CHF, chronic anemia.   Patient presented to the ED on 7/27 with complaint of shortness of breath and cough progressively worsening for 2 weeks also associated with nausea, vomiting and diarrhea  In the ED, patient was afebrile but hypotensive at 85/64, tachycardia at 103, breathing 100% on roomair. Labs showed WC count 2.3, hemoglobin 8.2, creatinine elevated to 2.52 however baseline of 1.89, potassium low at 2.7, magnesium low at 1.3 COVID PCR positive EKG with sinus tachycardia at 101 bpm, no ST-T wave changes CT chest, abdomen and pelvis showed bilateral lower lobe groundglass nodularity concerning for pneumonia, also showed mild colitis. Patient was admitted to hospital service for further evaluation management  Subjective: Patient was seen and examined this morning. Sitting up in bed.  States that she is continues to have diarrhea although less often now. She had liquidy stool in the commode mixed with urine. Patient is difficult for nursing care.  Would not allow for another IV access for fluid.  Assessment/Plan: COVID pneumonia Acute respiratory failure with hypoxia  -Presented with progressive worsening shortness of breath, cough, GI symptoms -COVID test: COVID PCR positive on admission -Chest imaging: CT chest on admission with bilateral pneumonia -Treatment: Completed 5-day course of remdesivir on 7/13.  Not any steroids.  Breathing comfortably on room air. -Continue antitussives -COVID isolation continue. -WBC and inflammatory markers trend as below.  Recent Labs  Lab 05/14/21 1535 05/14/21 1633 05/14/21 1921  05/14/21 2226 05/15/21 1347 05/16/21 0634 05/17/21 0637 05/18/21 0608 05/18/21 0800 05/19/21 0538  SARSCOV2NAA  --  POSITIVE*  --   --   --   --   --   --   --   --   WBC 2.3*  --   --   --  2.4* 2.3* 2.4* 2.5*  --  2.4*  LATICACIDVEN 1.1  --  1.1  --   --   --   --   --   --   --   PROCALCITON  --   --   --  0.31  --   --   --   --   --   --   DDIMER  --   --   --   --  8.73* 11.71* 16.12* 15.46*  --  14.56*  CRP  --   --   --   --  1.4* 1.7* 2.2*  --  2.9* 2.2*  ALT 11  --   --   --  11 11 11 12   --  14   Acute COVID-related gastroenteritis -Had nausea, vomiting and diarrhea at presentation. CT abdomen with findings of mild colitis and diarrheal stool -Low oral intake and diarrheal loss probably contributed to AKI and loss electrolytes. -Nausea improved.  Diarrhea improving as well.  Patient will benefit from IV hydration.  She is a hard stick and did not want any new IV access established.  Today she has agreed to another attempt by IV team..  If she does not cooperate, I mention to her that she could be discharged tomorrow to continue oral hydration at home.  Blood culture positive -Staph capitis  grew in 1 out of 4 blood cultures sent on admission.  Likely contaminant.  AKI on CKD 4 Acute metabolic acidosis -Creatinine bicarbonate trend as below. -Creatinine was elevated to 3.52 on admission, from a baseline of 1.89 last month.  Nephrology consultation following.  Creatinine improving last 24 hours.  She would be better off with IV fluid. -Continue to monitor. -7/30, patient was started on sodium bicarb tablets.  Continue same. Recent Labs    03/11/21 0928 04/01/21 0931 04/22/21 1406 05/14/21 1535 05/15/21 1347 05/16/21 0634 05/17/21 0637 05/18/21 0608 05/19/21 0538 05/20/21 0556  BUN 64* 54* 79* 103* 98* 97* 86* 78* 70* 58*  CREATININE 1.96* 1.89* 2.45* 3.52* 2.51* 2.59* 2.64* 2.58* 2.75* 2.42*  CO2 18* 16* 17* 14* 14* 14* 16* 14* 17* 17*    Hypokalemia/hypomagnesemia -Levels improved with the replacement Recent Labs  Lab 05/14/21 1535 05/15/21 1347 05/16/21 0634 05/17/21 0637 05/18/21 0608 05/19/21 0538 05/20/21 0556  K 2.7*   < > 3.5 2.7* 3.1* 3.0* 3.6  MG 1.3*  --   --  1.4*  --   --  2.2  PHOS  --   --   --  5.7*  --   --   --    < > = values in this interval not displayed.    History of systolic CHF Essential hypertension -Patient was initially hypotensive at presentation, blood pressure subsequently improved with hydration.   -EF in 2019 was reduced to 25 to 30%.  EF in last echo from April 2021 had improved to 70%. -Home meds include Toprol 50 mg daily, Entresto 24 mg / 26 mg daily, torsemide 40 mg twice daily. -Currently on metoprolol succinate at a reduced dose of 25 mg daily.  Entresto and Demadex on hold because of AKI.    Metastatic breast cancer  -Was on Xeloda at home. Currently on hold -Chest x-ray showing possible metastatic lesion on right proximal humerus. Continue to follow-up with oncology as an outpatient   Lupus nephritis -Continue CellCept and Plaquenil   Pancytopenia -Likely due to immunosuppressants. -Trend as below.  Overall stable. Recent Labs  Lab 05/15/21 1347 05/16/21 0634 05/16/21 2204 05/17/21 0637 05/18/21 0608 05/19/21 0538  WBC 2.4* 2.3*  --  2.4* 2.5* 2.4*  NEUTROABS 1.5* 1.4*  --  1.4* 1.5* 1.3*  HGB 8.3* 8.0* 8.9* 9.2* 7.9* 7.9*  HCT 23.7* 22.8* 24.8* 25.5* 24.2* 21.9*  MCV 78.2* 75.7*  --  74.1* 80.7 75.8*  PLT 69* 71*  --  67* 59* 65*   Right forearm swelling -Patient IV access was infiltrated and nurses wanted to remove it patient wanted to continue IV fluid through the same and did not want a new IV access attempted.  She now has swollen right forearm.  Recommend compression and elevation.  Mobility: Encourage ambulation. PT eval appreciated.  Home health PT recommended Code Status:   Code Status: Full Code  Nutritional status: Body mass index is 25.85  kg/m.     Diet:  Diet Order             Diet regular Room service appropriate? Yes; Fluid consistency: Thin  Diet effective now                  DVT prophylaxis: Patient does not want Lovenox shots.  Start SCD boots Place and maintain sequential compression device Start: 05/17/21 0904 enoxaparin (LOVENOX) injection 30 mg Start: 05/14/21 2200   Antimicrobials: None Fluid: Normal saline at 75 mill per hour  Consultants: Oncology Family Communication: None at bedside  Status is: Inpatient  Remains inpatient appropriate because: Continues to need IV hydration, monitoring  Dispo: The patient is from: Home              Anticipated d/c is to: Home with home health PT 1 to 2 days              Patient currently is not medically stable to d/c.   Difficult to place patient No     Infusions:   sodium chloride 250 mL (05/16/21 1007)   sodium chloride Stopped (05/20/21 0636)   promethazine (PHENERGAN) injection (IM or IVPB)      Scheduled Meds:  albuterol  2 puff Inhalation Q6H   vitamin C  500 mg Oral Daily   ciprofloxacin  1 drop Both Eyes Q4H while awake   enoxaparin (LOVENOX) injection  30 mg Subcutaneous Q24H   hydroxychloroquine  200 mg Oral Daily   loperamide  2 mg Oral Q6H   metoprolol succinate  25 mg Oral Daily   mycophenolate  500 mg Oral BID   sodium bicarbonate  650 mg Oral BID   zinc sulfate  220 mg Oral Daily    Antimicrobials: Anti-infectives (From admission, onward)    Start     Dose/Rate Route Frequency Ordered Stop   05/15/21 1600  hydroxychloroquine (PLAQUENIL) tablet 200 mg        200 mg Oral Daily 05/15/21 1459     05/15/21 1000  remdesivir 100 mg in sodium chloride 0.9 % 100 mL IVPB  Status:  Discontinued       See Hyperspace for full Linked Orders Report.   100 mg 200 mL/hr over 30 Minutes Intravenous Daily 05/14/21 1958 05/14/21 2210   05/15/21 1000  remdesivir 100 mg in sodium chloride 0.9 % 100 mL IVPB       See Hyperspace for full Linked  Orders Report.   100 mg 200 mL/hr over 30 Minutes Intravenous Daily 05/14/21 2210 05/19/21 0959   05/14/21 2315  remdesivir 100 mg in sodium chloride 0.9 % 100 mL IVPB       See Hyperspace for full Linked Orders Report.   100 mg 200 mL/hr over 30 Minutes Intravenous  Once 05/14/21 2210 05/15/21 0106   05/14/21 2215  remdesivir 100 mg in sodium chloride 0.9 % 100 mL IVPB       See Hyperspace for full Linked Orders Report.   100 mg 200 mL/hr over 30 Minutes Intravenous  Once 05/14/21 2210 05/15/21 0036   05/14/21 2100  remdesivir 200 mg in sodium chloride 0.9% 250 mL IVPB  Status:  Discontinued       See Hyperspace for full Linked Orders Report.   200 mg 580 mL/hr over 30 Minutes Intravenous Once 05/14/21 1958 05/14/21 2210   05/14/21 1915  cefTRIAXone (ROCEPHIN) 2 g in sodium chloride 0.9 % 100 mL IVPB  Status:  Discontinued        2 g 200 mL/hr over 30 Minutes Intravenous Every 24 hours 05/14/21 1905 05/15/21 1506   05/14/21 1915  azithromycin (ZITHROMAX) 500 mg in sodium chloride 0.9 % 250 mL IVPB  Status:  Discontinued        500 mg 250 mL/hr over 60 Minutes Intravenous Every 24 hours 05/14/21 1905 05/15/21 1506       PRN meds: sodium chloride, acetaminophen, alum & mag hydroxide-simeth, chlorpheniramine-HYDROcodone, guaiFENesin-dextromethorphan, ondansetron **OR** ondansetron (ZOFRAN) IV, promethazine (PHENERGAN) injection (IM or IVPB)   Objective: Vitals:  05/20/21 0700 05/20/21 1200  BP: 119/70 113/63  Pulse: 78 75  Resp: 18 18  Temp: 98 F (36.7 C) 98.4 F (36.9 C)  SpO2: 100% 99%    Intake/Output Summary (Last 24 hours) at 05/20/2021 1348 Last data filed at 05/20/2021 0636 Gross per 24 hour  Intake 1429.71 ml  Output --  Net 1429.71 ml    Filed Weights   05/14/21 1529  Weight: 58.1 kg   Weight change:  Body mass index is 25.85 kg/m.   Physical Exam: General exam: Pleasant, middle-aged African-American female.  Not in physical distress  skin: No rashes,  lesions or ulcers. HEENT: Atraumatic, normocephalic, no obvious bleeding Lungs: Clear to auscultation bilaterally CVS: Regular rate and rhythm, no murmur GI/Abd soft, nontender, nondistended, bowel sound present CNS: Awake, alert, oriented to place and person.   Psychiatry: Mood appropriate Extremities: No pedal edema, no calf tenderness.  Right forearm and hand swollen.  Data Review: I have personally reviewed the laboratory data and studies available.  Recent Labs  Lab 05/15/21 1347 05/16/21 0634 05/16/21 2204 05/17/21 0637 05/18/21 0608 05/19/21 0538  WBC 2.4* 2.3*  --  2.4* 2.5* 2.4*  NEUTROABS 1.5* 1.4*  --  1.4* 1.5* 1.3*  HGB 8.3* 8.0* 8.9* 9.2* 7.9* 7.9*  HCT 23.7* 22.8* 24.8* 25.5* 24.2* 21.9*  MCV 78.2* 75.7*  --  74.1* 80.7 75.8*  PLT 69* 71*  --  67* 59* 65*    Recent Labs  Lab 05/14/21 1535 05/15/21 1347 05/16/21 0634 05/17/21 0637 05/18/21 0608 05/19/21 0538 05/20/21 0556  NA 134*   < > 143 142 137 138 135  K 2.7*   < > 3.5 2.7* 3.1* 3.0* 3.6  CL 102   < > 114* 113* 113* 110 113*  CO2 14*   < > 14* 16* 14* 17* 17*  GLUCOSE 108*   < > 92 99 101* 120* 87  BUN 103*   < > 97* 86* 78* 70* 58*  CREATININE 3.52*   < > 2.59* 2.64* 2.58* 2.75* 2.42*  CALCIUM 7.8*   < > 8.4* 8.5* 8.2* 8.6* 8.2*  MG 1.3*  --   --  1.4*  --   --  2.2  PHOS  --   --   --  5.7*  --   --   --    < > = values in this interval not displayed.     F/u labs ordered Unresulted Labs (From admission, onward)     Start     Ordered   05/21/21 0500  Creatinine, serum  (enoxaparin (LOVENOX)    CrCl < 30 ml/min)  Weekly,   STAT     Comments: while on enoxaparin therapy.    05/14/21 1958   05/21/21 0500  CBC with Differential/Platelet  Daily,   R     Question:  Specimen collection method  Answer:  Lab=Lab collect   05/20/21 0737   05/21/21 3428  Basic metabolic panel  Daily,   R     Question:  Specimen collection method  Answer:  Lab=Lab collect   05/20/21 7681             Signed, Terrilee Croak, MD [facility] 05/20/2021

## 2021-05-21 DIAGNOSIS — J1282 Pneumonia due to coronavirus disease 2019: Secondary | ICD-10-CM | POA: Diagnosis not present

## 2021-05-21 DIAGNOSIS — U071 COVID-19: Secondary | ICD-10-CM | POA: Diagnosis not present

## 2021-05-21 DIAGNOSIS — A0839 Other viral enteritis: Secondary | ICD-10-CM

## 2021-05-21 DIAGNOSIS — N189 Chronic kidney disease, unspecified: Secondary | ICD-10-CM | POA: Diagnosis not present

## 2021-05-21 DIAGNOSIS — N179 Acute kidney failure, unspecified: Secondary | ICD-10-CM | POA: Diagnosis not present

## 2021-05-21 LAB — CBC WITH DIFFERENTIAL/PLATELET
Abs Immature Granulocytes: 0.05 10*3/uL (ref 0.00–0.07)
Basophils Absolute: 0 10*3/uL (ref 0.0–0.1)
Basophils Relative: 0 %
Eosinophils Absolute: 0 10*3/uL (ref 0.0–0.5)
Eosinophils Relative: 2 %
HCT: 20.2 % — ABNORMAL LOW (ref 36.0–46.0)
Hemoglobin: 7.3 g/dL — ABNORMAL LOW (ref 12.0–15.0)
Immature Granulocytes: 2 %
Lymphocytes Relative: 33 %
Lymphs Abs: 0.8 10*3/uL (ref 0.7–4.0)
MCH: 26.9 pg (ref 26.0–34.0)
MCHC: 36.1 g/dL — ABNORMAL HIGH (ref 30.0–36.0)
MCV: 74.5 fL — ABNORMAL LOW (ref 80.0–100.0)
Monocytes Absolute: 0.4 10*3/uL (ref 0.1–1.0)
Monocytes Relative: 15 %
Neutro Abs: 1.2 10*3/uL — ABNORMAL LOW (ref 1.7–7.7)
Neutrophils Relative %: 48 %
Platelets: 56 10*3/uL — ABNORMAL LOW (ref 150–400)
RBC: 2.71 MIL/uL — ABNORMAL LOW (ref 3.87–5.11)
RDW: 18.2 % — ABNORMAL HIGH (ref 11.5–15.5)
WBC: 2.4 10*3/uL — ABNORMAL LOW (ref 4.0–10.5)
nRBC: 1.3 % — ABNORMAL HIGH (ref 0.0–0.2)

## 2021-05-21 LAB — BASIC METABOLIC PANEL
Anion gap: 6 (ref 5–15)
BUN: 50 mg/dL — ABNORMAL HIGH (ref 6–20)
CO2: 16 mmol/L — ABNORMAL LOW (ref 22–32)
Calcium: 8.6 mg/dL — ABNORMAL LOW (ref 8.9–10.3)
Chloride: 115 mmol/L — ABNORMAL HIGH (ref 98–111)
Creatinine, Ser: 2.13 mg/dL — ABNORMAL HIGH (ref 0.44–1.00)
GFR, Estimated: 27 mL/min — ABNORMAL LOW (ref >60)
Glucose, Bld: 83 mg/dL (ref 70–99)
Potassium: 3.5 mmol/L (ref 3.5–5.1)
Sodium: 137 mmol/L (ref 135–145)

## 2021-05-21 LAB — CULTURE, BLOOD (ROUTINE X 2)
Culture: NO GROWTH
Culture: NO GROWTH

## 2021-05-21 MED ORDER — SODIUM BICARBONATE 650 MG PO TABS: 650.0000 mg | ORAL_TABLET | Freq: Two times a day (BID) | ORAL | 0 refills | Status: DC

## 2021-05-21 MED ORDER — DARBEPOETIN ALFA 200 MCG/0.4ML IJ SOSY
500.0000 ug | PREFILLED_SYRINGE | Freq: Once | INTRAMUSCULAR | Status: AC
  Administered 2021-05-21: 14:00:00 500 ug via SUBCUTANEOUS
  Filled 2021-05-21: qty 1.2

## 2021-05-21 MED ORDER — DARBEPOETIN ALFA 100 MCG/0.5ML IJ SOSY: 500.0000 ug | PREFILLED_SYRINGE | Freq: Once | INTRAMUSCULAR | Status: DC

## 2021-05-21 NOTE — Progress Notes (Signed)
Physical Therapy Treatment Patient Details Name: Melanie Holloway MRN: [id number] DOB: September 29, 1966 Today's Date: 05/21/2021    History of Present Illness 55 y.o. female with PMH significant for lupus nephritis,, CKD stage IV, metastatic breast cancer, chronic systolic CHF, chronic anemia.    Patient presented to the ED on 7/27 with complaint of shortness of breath and cough progressively worsening for 2 weeks also associated with nausea, vomiting and diarrhea.  (+) Covid test.    PT Comments    Pt tolerated treatment fair today with further mobility limited secondary to fatigue. Attempted circuit training today with ambulation x BLE therex to address activity tolerance limitations. Pt able to make it through one round but unable to progress due to generalized fatigue. Pt continues to demonstrate gait deviations that increase her risk of falls; cues for correction provided but pt unable to perform. Pt declining to sit in recliner today, despite education regarding benefits. Pt will continue to benefit from skilled acute PT services to address deficits for return to baseline function. Will continue to recommend HHPT at DC.   Follow Up Recommendations  Home health PT     Equipment Recommendations  None recommended by PT       Precautions / Restrictions Precautions Precautions: Fall Restrictions Weight Bearing Restrictions: No    Mobility  Bed Mobility Overal bed mobility: Modified Independent             General bed mobility comments: needed bed rails, able to rise w/o assist    Transfers Overall transfer level: Needs assistance Equipment used: 4-wheeled walker Transfers: Sit to/from Stand Sit to Stand: Supervision         General transfer comment: Supervision for safety to perform STS from EOB with rollator. Increased time/effort to achieve full upright standing. Demonstrates good safety awareness with hand placement for transfers without  cueing.  Ambulation/Gait Ambulation/Gait assistance: Supervision Gait Distance (Feet): 60 Feet Assistive device: 4-wheeled walker       General Gait Details: Supervision for safety to ambulate in room with Rollator. Demonstrates decreased foot clearance bil, decreased step length bil, and slowed cadence. Verbal cues for upright posture, hand placement on rollator, and to improve bil foot clearance to reduce risk of falls.    Balance Overall balance assessment: Needs assistance Sitting-balance support: No upper extremity supported Sitting balance-Leahy Scale: Good     Standing balance support: Bilateral upper extremity supported Standing balance-Leahy Scale: Fair Standing balance comment: BUE support on rollator; slowed cadence due to generalized deconditioning        Cognition Arousal/Alertness: Awake/alert;Lethargic Behavior During Therapy: WFL for tasks assessed/performed Overall Cognitive Status: Within Functional Limits for tasks assessed          General Comments: Able to follow 100% of multistep commands      Exercises Other Exercises Other Exercises: Pt able to participate in bed mobility, transfers, and gait with rollator and supervision for safety. Other Exercises: Pt able to perform x10 BLE therex including: seated marching, LAQ, and isometric hip ADD. Other Exercises: Pt educated regarding: PT role/POC, safety with mobility, benefits of OOB mobility/participation.        Pertinent Vitals/Pain Pain Assessment: No/denies pain     PT Goals (current goals can now be found in the care plan section) Acute Rehab PT Goals Patient Stated Goal: go home PT Goal Formulation: With patient Time For Goal Achievement: 05/31/21 Potential to Achieve Goals: Good Progress towards PT goals: Progressing toward goals    Frequency    Min 2X/week  PT Plan Current plan remains appropriate       AM-PAC PT "6 Clicks" Mobility   Outcome Measure  Help needed  turning from your back to your side while in a flat bed without using bedrails?: None Help needed moving from lying on your back to sitting on the side of a flat bed without using bedrails?: None Help needed moving to and from a bed to a chair (including a wheelchair)?: None Help needed standing up from a chair using your arms (e.g., wheelchair or bedside chair)?: None Help needed to walk in hospital room?: A Little Help needed climbing 3-5 steps with a railing? : A Lot 6 Click Score: 21    End of Session Equipment Utilized During Treatment: Gait belt Activity Tolerance: Patient tolerated treatment well;Patient limited by fatigue Patient left: with bed alarm set;with call bell/phone within reach (left in care of MD) Nurse Communication: Mobility status PT Visit Diagnosis: Muscle weakness (generalized) (M62.81);Unsteadiness on feet (R26.81)     Time: 0981-1914 PT Time Calculation (min) (ACUTE ONLY): 18 min  Charges:  $Therapeutic Exercise: 8-22 mins                     Herminio Commons, PT, DPT 12:10 PM,05/21/21

## 2021-05-21 NOTE — Progress Notes (Signed)
Notified by lab that CBC clotted, lab to reorder and a phlebotomist to come collect.

## 2021-05-21 NOTE — Progress Notes (Signed)
[facility]  ROUNDING NOTE   Subjective:   Melanie Holloway is a 55 y.o. female with past medical history including Lupus nephritis with CKD4, anemia, metastatic breast cancer, and CH. She presents to ED with complaints of cough, vomiting, diarrhea and shortness of breath. She has been admitted for Nausea [R11.0] AKI (acute kidney injury) ([place]) [N17.9] Malignant neoplasm of left female breast, unspecified estrogen receptor status, unspecified site of breast ([place]) [C50.912] Sepsis with acute organ dysfunction without septic shock, due to unspecified organism, unspecified type ([place]) [A41.9, R65.20] COVID [U07.1] Pneumonia due to COVID-19 virus [U07.1, J12.82]   Patient is currently seen by our practice.   Patient seen and evaluated at bedside Alert and oriented States she continues to feel better Adequate oral intake Patient seen later after working with PT   Objective:  Vital signs in last 24 hours:  Temp:  [97.5 F (36.4 C)-98.6 F (37 C)] 98.4 F (36.9 C) (08/03 0730) Pulse Rate:  [75-82] 80 (08/03 0730) Resp:  [16-18] 18 (08/03 0730) BP: (102-125)/(60-68) 116/62 (08/03 0730) SpO2:  [99 %-100 %] 100 % (08/03 0730)  Weight change:  Filed Weights   05/14/21 1529  Weight: 58.1 kg    Intake/Output: I/O last 3 completed shifts: In: 1178 [I.V.:1178] Out: -    Intake/Output this shift:  No intake/output data recorded.  Physical Exam: General: NAD, laying in bed  Head: Normocephalic, atraumatic. Moist oral mucosal membranes  Eyes: Anicteric  Lungs:  Clear to auscultation, normal effot  Heart: Regular rate and rhythm  Abdomen:  Soft, nontender  Extremities:  no peripheral edema.  Neurologic: Nonfocal, moving all four extremities  Skin: No lesions       Basic Metabolic Panel: Recent Labs  Lab 05/14/21 1535 05/15/21 1347 05/17/21 0637 05/18/21 0608 05/19/21 0538 05/20/21 0556 05/21/21 0516  NA 134*   < > 142 137 138 135 137  K 2.7*   < > 2.7*  3.1* 3.0* 3.6 3.5  CL 102   < > 113* 113* 110 113* 115*  CO2 14*   < > 16* 14* 17* 17* 16*  GLUCOSE 108*   < > 99 101* 120* 87 83  BUN 103*   < > 86* 78* 70* 58* 50*  CREATININE 3.52*   < > 2.64* 2.58* 2.75* 2.42* 2.13*  CALCIUM 7.8*   < > 8.5* 8.2* 8.6* 8.2* 8.6*  MG 1.3*  --  1.4*  --   --  2.2  --   PHOS  --   --  5.7*  --   --   --   --    < > = values in this interval not displayed.     Liver Function Tests: Recent Labs  Lab 05/15/21 1347 05/16/21 0634 05/17/21 0637 05/18/21 0608 05/19/21 0538  AST 30 25 28 30  34  ALT 11 11 11 12 14   ALKPHOS 89 86 77 89 101  BILITOT 0.8 0.7 0.8 0.7 0.7  PROT 6.9 6.5 5.7* 4.9* 5.1*  ALBUMIN 3.4* 3.2* 3.0* 2.6* 2.6*    No results for input(s): LIPASE, AMYLASE in the last 168 hours. No results for input(s): AMMONIA in the last 168 hours.  CBC: Recent Labs  Lab 05/16/21 0634 05/16/21 2204 05/17/21 0637 05/18/21 0608 05/19/21 0538 05/21/21 0552  WBC 2.3*  --  2.4* 2.5* 2.4* 2.4*  NEUTROABS 1.4*  --  1.4* 1.5* 1.3* 1.2*  HGB 8.0* 8.9* 9.2* 7.9* 7.9* 7.3*  HCT 22.8* 24.8* 25.5* 24.2* 21.9* 20.2*  MCV 75.7*  --  74.1* 80.7 75.8* 74.5*  PLT 71*  --  67* 59* 65* 56*     Cardiac Enzymes: No results for input(s): CKTOTAL, CKMB, CKMBINDEX, TROPONINI in the last 168 hours.  BNP: Invalid input(s): POCBNP  CBG: Recent Labs  Lab 05/17/21 1730  GLUCAP 98     Microbiology: Results for orders placed or performed during the hospital encounter of 05/14/21  Resp Panel by RT-PCR (Flu A&B, Covid) Nasopharyngeal Swab     Status: Abnormal   Collection Time: 05/14/21  4:33 PM   Specimen: Nasopharyngeal Swab; Nasopharyngeal(NP) swabs in vial transport medium  Result Value Ref Range Status   SARS Coronavirus 2 by RT PCR POSITIVE (A) NEGATIVE Final    Comment: RESULT CALLED TO, READ BACK BY AND VERIFIED WITH: STEPHANIE RUDD @1822  05/14/21 MJU (NOTE) SARS-CoV-2 target nucleic acids are DETECTED.  The SARS-CoV-2 RNA is generally  detectable in upper respiratory specimens during the acute phase of infection. Positive results are indicative of the presence of the identified virus, but do not rule out bacterial infection or co-infection with other pathogens not detected by the test. Clinical correlation with patient history and other diagnostic information is necessary to determine patient infection status. The expected result is Negative.  Fact Sheet for Patients: EntrepreneurPulse.com.au  Fact Sheet for Healthcare Providers: IncredibleEmployment.be  This test is not yet approved or cleared by the [place] FDA and  has been authorized for detection and/or diagnosis of SARS-CoV-2 by FDA under an Emergency Use Authorization (EUA).  This EUA will remain in effect (meaning this test can be  used) for the duration of  the COVID-19 declaration under Section 564(b)(1) of the Act, 21 U.S.C. section 360bbb-3(b)(1), unless the authorization is terminated or revoked sooner.     Influenza A by PCR NEGATIVE NEGATIVE Final   Influenza B by PCR NEGATIVE NEGATIVE Final    Comment: (NOTE) The Xpert Xpress SARS-CoV-2/FLU/RSV plus assay is intended as an aid in the diagnosis of influenza from Nasopharyngeal swab specimens and should not be used as a sole basis for treatment. Nasal washings and aspirates are unacceptable for Xpert Xpress SARS-CoV-2/FLU/RSV testing.  Fact Sheet for Patients: EntrepreneurPulse.com.au  Fact Sheet for Healthcare Providers: IncredibleEmployment.be  This test is not yet approved or cleared by the [place] FDA and has been authorized for detection and/or diagnosis of SARS-CoV-2 by FDA under an Emergency Use Authorization (EUA). This EUA will remain in effect (meaning this test can be used) for the duration of the COVID-19 declaration under Section 564(b)(1) of the Act, 21 U.S.C. section 360bbb-3(b)(1), unless the  authorization is terminated or revoked.  Performed at [facility], [address] [place], [place] [address]   Blood Culture (routine x 2)     Status: Abnormal   Collection Time: 05/14/21  7:21 PM   Specimen: BLOOD  Result Value Ref Range Status   Specimen Description   Final    BLOOD RIGHT ANTECUBITAL Performed at [facility], [address] [place], [place] [address]    Special Requests   Final    BOTTLES DRAWN AEROBIC AND ANAEROBIC Blood Culture results may not be optimal due to an inadequate volume of blood received in culture bottles Performed at [facility], [address] [place], [place] [address]    Culture  Setup Time   Final    GRAM POSITIVE COCCI IN BOTH AEROBIC AND ANAEROBIC BOTTLES CRITICAL RESULT CALLED TO, READ BACK BY AND VERIFIED WITH: NATHAN BLUE@0101   05/16/21 RH    Culture (A)  Final    STAPHYLOCOCCUS CAPITIS THE SIGNIFICANCE OF ISOLATING THIS ORGANISM FROM A SINGLE SET OF BLOOD CULTURES WHEN MULTIPLE SETS ARE DRAWN IS UNCERTAIN. PLEASE NOTIFY THE MICROBIOLOGY DEPARTMENT WITHIN ONE WEEK IF SPECIATION AND SENSITIVITIES ARE REQUIRED. Performed at [facility], [place] [address] [place], [place] [address]    Report Status 05/18/2021 FINAL  Final  Blood Culture (routine x 2)     Status: None   Collection Time: 05/14/21  7:21 PM   Specimen: BLOOD  Result Value Ref Range Status   Specimen Description BLOOD LEFT ANTECUBITAL  Final   Special Requests   Final    BOTTLES DRAWN AEROBIC AND ANAEROBIC Blood Culture results may not be optimal due to an inadequate volume of blood received in culture bottles   Culture   Final    NO GROWTH 5 DAYS Performed at [facility], [address] [place], [place] [address]    Report Status 05/19/2021 FINAL  Final  Blood Culture ID Panel (Reflexed)     Status: Abnormal   Collection Time: 05/14/21  7:21 PM  Result Value Ref Range Status   Enterococcus faecalis NOT DETECTED NOT  DETECTED Final   Enterococcus Faecium NOT DETECTED NOT DETECTED Final   Listeria monocytogenes NOT DETECTED NOT DETECTED Final   Staphylococcus species DETECTED (A) NOT DETECTED Final    Comment: CRITICAL RESULT CALLED TO, READ BACK BY AND VERIFIED WITH: NATHAN BLUE @0101  05/16/21 RH    Staphylococcus aureus (BCID) NOT DETECTED NOT DETECTED Final   Staphylococcus epidermidis NOT DETECTED NOT DETECTED Final   Staphylococcus lugdunensis NOT DETECTED NOT DETECTED Final   Streptococcus species NOT DETECTED NOT DETECTED Final   Streptococcus agalactiae NOT DETECTED NOT DETECTED Final   Streptococcus pneumoniae NOT DETECTED NOT DETECTED Final   Streptococcus pyogenes NOT DETECTED NOT DETECTED Final   A.calcoaceticus-baumannii NOT DETECTED NOT DETECTED Final   Bacteroides fragilis NOT DETECTED NOT DETECTED Final   Enterobacterales NOT DETECTED NOT DETECTED Final   Enterobacter cloacae complex NOT DETECTED NOT DETECTED Final   Escherichia coli NOT DETECTED NOT DETECTED Final   Klebsiella aerogenes NOT DETECTED NOT DETECTED Final   Klebsiella oxytoca NOT DETECTED NOT DETECTED Final   Klebsiella pneumoniae NOT DETECTED NOT DETECTED Final   Proteus species NOT DETECTED NOT DETECTED Final   Salmonella species NOT DETECTED NOT DETECTED Final   Serratia marcescens NOT DETECTED NOT DETECTED Final   Haemophilus influenzae NOT DETECTED NOT DETECTED Final   Neisseria meningitidis NOT DETECTED NOT DETECTED Final   Pseudomonas aeruginosa NOT DETECTED NOT DETECTED Final   Stenotrophomonas maltophilia NOT DETECTED NOT DETECTED Final   Candida albicans NOT DETECTED NOT DETECTED Final   Candida auris NOT DETECTED NOT DETECTED Final   Candida glabrata NOT DETECTED NOT DETECTED Final   Candida krusei NOT DETECTED NOT DETECTED Final   Candida parapsilosis NOT DETECTED NOT DETECTED Final   Candida tropicalis NOT DETECTED NOT DETECTED Final   Cryptococcus neoformans/gattii NOT DETECTED NOT DETECTED Final     Comment: Performed at [facility], [address] [place], [place] [address]  CULTURE, BLOOD (ROUTINE X 2) w Reflex to ID Panel     Status: None   Collection Time: 05/16/21 10:13 PM   Specimen: BLOOD  Result Value Ref Range Status   Specimen Description BLOOD BLOOD RIGHT WRIST  Final   Special Requests   Final    IN PEDIATRIC BOTTLE Blood Culture results may not be  optimal due to an excessive volume of blood received in culture bottles   Culture   Final    NO GROWTH 5 DAYS Performed at [facility], [address] [place], [place] [address]    Report Status 05/21/2021 FINAL  Final  CULTURE, BLOOD (ROUTINE X 2) w Reflex to ID Panel     Status: None   Collection Time: 05/16/21 10:13 PM   Specimen: BLOOD  Result Value Ref Range Status   Specimen Description BLOOD BLOOD RIGHT HAND  Final   Special Requests   Final    IN PEDIATRIC BOTTLE Blood Culture results may not be optimal due to an excessive volume of blood received in culture bottles   Culture   Final    NO GROWTH 5 DAYS Performed at [facility], [address], [place], [place] [address]    Report Status 05/21/2021 FINAL  Final    Coagulation Studies: No results for input(s): LABPROT, INR in the last 72 hours.  Urinalysis: No results for input(s): COLORURINE, LABSPEC, PHURINE, GLUCOSEU, HGBUR, BILIRUBINUR, KETONESUR, PROTEINUR, UROBILINOGEN, NITRITE, LEUKOCYTESUR in the last 72 hours.  Invalid input(s): APPERANCEUR    Imaging: No results found.   Medications:    sodium chloride 250 mL (05/16/21 1007)   promethazine (PHENERGAN) injection (IM or IVPB)      albuterol  2 puff Inhalation Q6H   vitamin C  500 mg Oral Daily   ciprofloxacin  1 drop Both Eyes Q4H while awake   enoxaparin (LOVENOX) injection  30 mg Subcutaneous Q24H   hydroxychloroquine  200 mg Oral Daily   loperamide  2 mg Oral Q6H   metoprolol succinate  25 mg Oral Daily   mycophenolate  500 mg Oral BID   sodium  bicarbonate  650 mg Oral BID   zinc sulfate  220 mg Oral Daily   sodium chloride, acetaminophen, alum & mag hydroxide-simeth, chlorpheniramine-HYDROcodone, guaiFENesin-dextromethorphan, ondansetron **OR** ondansetron (ZOFRAN) IV, promethazine (PHENERGAN) injection (IM or IVPB)  Assessment/ Plan:  [name] is a 55 y.o.  female with past medical history including Lupus nephritis with CKD4, anemia, metastatic breast cancer, and CH. She presents to ED with complaints of cough, vomiting, diarrhea and shortness of breath. She has been admitted for Nausea [R11.0] AKI (acute kidney injury) ([place]) [N17.9] Malignant neoplasm of left female breast, unspecified estrogen receptor status, unspecified site of breast ([place]) [C50.912] Sepsis with acute organ dysfunction without septic shock, due to unspecified organism, unspecified type ([place]) [A41.9, R65.20] COVID [U07.1] Pneumonia due to COVID-19 virus [U07.1, J12.82]   Acute Kidney Injury on chronic kidney disease stage 4 with baseline creatinine 2.45 and GFR of 23 on 04/22/21.  Acute kidney injury secondary to ATN Acute kidney injury is complicated by heart failure No IV contrast exposure and no indication for dialysis at this time  IVF held after PIV placement due to adequate oral intake Creatinine continues to improve Will monitor closely Will need nephrology follow up at discharge with our clinic  Lab Results  Component Value Date   CREATININE 2.13 (H) 05/21/2021   CREATININE 2.42 (H) 05/20/2021   CREATININE 2.75 (H) 05/19/2021   No intake or output data in the 24 hours ending 05/21/21 0950   2. Anemia of chronic kidney disease  Lab Results  Component Value Date   HGB 7.3 (L) 05/21/2021  Hgb below target Spoke with Oncology and they agree to give Aranesp Will continue to monitor  Secondary Hyperparathyroidism:    Lab Results  Component  Value Date   PTH 47 01/16/2020   PTH Comment 01/16/2020   CALCIUM 8.6 (L) 05/21/2021    PHOS 5.7 (H) 05/17/2021  Calcium not at goal but has improved Continue to monitor   LOS: 7 Clever Geraldo 8/3/20229:50 AM

## 2021-05-21 NOTE — Discharge Summary (Signed)
PATIENT DETAILS Name: SHAVONE NEVERS Age: 55 y.o. Sex: female Date of Birth: 1966-10-16 MRN: [id number]. Admitting Physician: Athena Masse, MD [id number], Garlon Hatchet, MD  Admit Date: 05/14/2021 Discharge date: 05/21/2021  Recommendations for Outpatient Follow-up:  Follow up with PCP in 1-2 weeks Please obtain CMP/CBC in one week Please ensure follow-up with nephrology.  Admitted From:  Home  Disposition: Home with home health services   Beatrice: Yes  Equipment/Devices: None  Discharge Condition: Stable  CODE STATUS: FULL CODE  Diet recommendation:  Diet Order             Diet - low sodium heart healthy           Diet regular Room service appropriate? Yes; Fluid consistency: Thin  Diet effective now                    Brief Summary: See H&P, Labs, Consult and Test reports for all details in brief, patient is a 55 y.o. female with PMH significant for lupus nephritis,, CKD stage IV, metastatic breast cancer, chronic systolic CHF, chronic anemia-who presented to the ED on 7/27 with nausea/vomiting/diarrhea and shortness of breath-she was found to have hypoxia due to COVID-19 infection.    Brief Hospital Course: Acute hypoxic respiratory failure due to COVID-19 pneumonia: Clinically improved-now on room air.  Completed a course of Remdesivir.  Gastroenteritis: Likely related to COVID-19-resolved with no further nausea vomiting or diarrhea.  AKI on CKD stage IV: Creatinine improved-back to baseline.  Appreciate nephrology input-patient to follow-up with nephrology postdischarge.  Continue to monitor renal function closely.  Discussed with nephrology-okay to resume Demadex discharge.  History of lupus nephritis: Continue Plaquenil and CellCept.  Follow with nephrology in the outpatient setting  Normocytic anemia: Will receive Aranesp today-continue to monitor CBC closely.  No evidence of GI loss.  Chronic systolic heart failure: Volume status stable.  EF  in 2019 was reduced to 25-30%, however EF on last echo in April 2021 had improved.  Given stable blood pressure-continue to hold Entresto until seen by outpatient physicians-resume Demadex and metoprolol for now.  Metastatic breast cancer: Resume Xeloda-continue outpatient follow-up with oncology.  Pancytopenia: Appears to be a chronic issue-continue outpatient monitoring.  RN pressure injury documentation: Pressure Injury 03/04/18 Stage II -  Partial thickness loss of dermis presenting as a shallow open ulcer with a red, pink wound bed without slough. (Active)  03/04/18 0606  Location: Buttocks  Location Orientation: Left  Staging: Stage II -  Partial thickness loss of dermis presenting as a shallow open ulcer with a red, pink wound bed without slough.  Wound Description (Comments):   Present on Admission: Yes    Procedures None  Discharge Diagnoses:  Principal Problem:   Pneumonia due to COVID-19 virus Active Problems:   Essential hypertension   Metastatic breast cancer ([place])   Anemia associated with stage 4 chronic renal failure (HCC)   Lupus erythematosus tumidus   Leukopenia   Hypokalemia due to excessive gastrointestinal loss of potassium   Chronic systolic CHF (congestive heart failure) (HCC)   Acute kidney injury superimposed on CKD IV ([place])   Gastroenteritis due to COVID-19 virus   Increased anion gap metabolic acidosis   Discharge Instructions:  Activity:  As tolerated   Discharge Instructions     Diet - low sodium heart healthy   Complete by: As directed    Discharge instructions   Complete by: As directed    Follow with Primary MD  Danise Mina,  Garlon Hatchet, MD in 1-2 weeks  Please get a complete blood count and chemistry panel checked by your Primary MD at your next visit, and again as instructed by your Primary MD.  Get Medicines reviewed and adjusted: Please take all your medications with you for your next visit with your Primary  MD  Laboratory/radiological data: Please request your Primary MD to go over all hospital tests and procedure/radiological results at the follow up, please ask your Primary MD to get all Hospital records sent to his/her office.  In some cases, they will be blood work, cultures and biopsy results pending at the time of your discharge. Please request that your primary care M.D. follows up on these results.  Also Note the following: If you experience worsening of your admission symptoms, develop shortness of breath, life threatening emergency, suicidal or homicidal thoughts you must seek medical attention immediately by calling 911 or calling your MD immediately  if symptoms less severe.  You must read complete instructions/literature along with all the possible adverse reactions/side effects for all the Medicines you take and that have been prescribed to you. Take any new Medicines after you have completely understood and accpet all the possible adverse reactions/side effects.   Do not drive when taking Pain medications or sleeping medications (Benzodaizepines)  Do not take more than prescribed Pain, Sleep and Anxiety Medications. It is not advisable to combine anxiety,sleep and pain medications without talking with your primary care practitioner  Special Instructions: If you have smoked or chewed Tobacco  in the last 2 yrs please stop smoking, stop any regular Alcohol  and or any Recreational drug use.  Wear Seat belts while driving.  Please note: You were cared for by a hospitalist during your hospital stay. Once you are discharged, your primary care physician will handle any further medical issues. Please note that NO REFILLS for any discharge medications will be authorized once you are discharged, as it is imperative that you return to your primary care physician (or establish a relationship with a primary care physician if you do not have one) for your post hospital discharge needs so that  they can reassess your need for medications and monitor your lab values.   Increase activity slowly   Complete by: As directed       Allergies as of 05/21/2021   No Known Allergies      Medication List     STOP taking these medications    Entresto 24-26 MG Generic drug: sacubitril-valsartan       TAKE these medications    acetaminophen 325 MG tablet Commonly known as: TYLENOL Take 2 tablets (650 mg total) by mouth every 4 (four) hours as needed for headache or mild pain.   capecitabine 500 MG tablet Commonly known as: XELODA TAKE 2 TABLETS BY MOUTH TWICE DAILY AFTER A MEAL FOR 14 DAYS, THEN DO NOT TAKE FOR 7 DAYS. REPEAT.   cholestyramine 4 g packet Commonly known as: Questran Take 1 packet (4 g total) by mouth daily.   clobetasol cream 0.05 % Commonly known as: TEMOVATE Apply 1 application topically 2 (two) times daily.   Deferiprone 1000 MG Tabs Commonly known as: Ferriprox Take 1 tablet by mouth in the morning and at bedtime.   hydroxychloroquine 200 MG tablet Commonly known as: PLAQUENIL Take 1 tablet (200 mg total) by mouth daily.   loperamide 2 MG capsule Commonly known as: IMODIUM Take 1 capsule (2 mg total) by mouth as needed for diarrhea or loose stools.  Take 1 capsule after the first diarrhea event, and repeat with each additional diarrhea, maximum 6 tablets per day   metoprolol succinate 50 MG 24 hr tablet Commonly known as: TOPROL-XL Take 1 tablet (50 mg total) by mouth daily. Take with or immediately following a meal.   mycophenolate 250 MG capsule Commonly known as: CELLCEPT Take 2 capsules (500 mg total) by mouth 2 (two) times daily.   ondansetron 8 MG tablet Commonly known as: ZOFRAN Take 1 tablet (8 mg total) by mouth 2 (two) times daily as needed for nausea or vomiting.   potassium chloride 10 MEQ tablet Commonly known as: KLOR-CON Take 10 mEq by mouth every evening.   sodium bicarbonate 650 MG tablet Take 1 tablet (650 mg total) by  mouth 2 (two) times daily.   torsemide 20 MG tablet Commonly known as: DEMADEX Take 2 tablets (40 mg total) by mouth 2 (two) times daily.        Follow-up Information     Ria Bush, MD. Schedule an appointment as soon as possible for a visit in 1 week(s).   Specialty: Family Medicine Contact information: [place] [address] [phone]         Lavonia Dana, MD. Schedule an appointment as soon as possible for a visit in 1 week(s).   Specialty: Nephrology Contact information: [address] [place] [address] [phone]                No Known Allergies    Consultations:  Renal   Other Procedures/Studies: DG Chest 2 View  Result Date: 05/14/2021 CLINICAL DATA:  Shortness of breath EXAM: CHEST - 2 VIEW COMPARISON:  03/03/2018 FINDINGS: Heart size within normal limits. No pulmonary vascular congestion. Right hemidiaphragm is mildly elevated. Lungs are clear. There is atypical mottled appearance of the right proximal humerus which is suspicious for marrow replacement process. IMPRESSION: 1. No acute cardiopulmonary process. 2. Atypical mottled appearance of the right proximal humerus is suspicious for marrow replacement process such is multiple myeloma, myelofibrosis, or metastatic disease. Electronically Signed   By: Miachel Roux M.D.   On: 05/14/2021 16:22   CT CHEST ABDOMEN PELVIS WO CONTRAST  Result Date: 05/14/2021 CLINICAL DATA:  55 year old female with nausea and vomiting. History breast cancer with bone metastases. EXAM: CT CHEST, ABDOMEN AND PELVIS WITHOUT CONTRAST TECHNIQUE: Multidetector CT imaging of the chest, abdomen and pelvis was performed following the standard protocol without IV contrast. COMPARISON:  Chest radiograph dated 05/14/2021 and CT abdomen pelvis dated 11/19/2020. FINDINGS: Evaluation of this exam is limited in the absence of intravenous contrast. CT CHEST FINDINGS Cardiovascular:  There is no cardiomegaly. Trace pericardial effusion. There is coronary vascular calcification. There is mild atherosclerotic calcification the thoracic aorta. No aneurysmal dilatation. The central pulmonary arteries are grossly unremarkable on this noncontrast CT. Mediastinum/Nodes: No hilar or mediastinal adenopathy. Evaluation however is limited in the absence of intravenous contrast. The esophagus and the thyroid gland are grossly unremarkable. No mediastinal fluid collection. Lungs/Pleura: Scattered bilateral lower lobe predominant areas of ground-glass nodularity most concerning for pneumonia, likely atypical in etiology. Metastatic disease is less likely but not excluded clinical correlation and follow-up to resolution recommended. Areas of subpleural scarring noted at the lung bases and lingula. No lobar consolidation, pleural effusion, or pneumothorax. The central airways are patent. Musculoskeletal: There is severe diffuse osseous metastatic disease. Old right rib fracture. No acute osseous pathology. CT ABDOMEN PELVIS FINDINGS No intra-abdominal free air or free  fluid. Hepatobiliary: Ill-defined area of hypodensity involving the left lobe of the liver appears progressed since the prior CT. This is not evaluated and although may represent an area of fatty infiltration, metastatic disease is not excluded clinical correlation is recommended. The gallbladder is filled with stones. There is diffuse thickened appearance of the gallbladder wall which may be chronic. No pericholecystic fluid. Ultrasound may provide better evaluation of the gallbladder if there is high clinical concern for acute cholecystitis. Pancreas: Unremarkable. No pancreatic ductal dilatation or surrounding inflammatory changes. Spleen: Normal in size without focal abnormality. Adrenals/Urinary Tract: The adrenal glands are unremarkable. The kidneys, visualized ureters, and urinary bladder appear unremarkable. Stomach/Bowel: There is a small  hiatal hernia. Several normal caliber fluid-filled loops of small bowel may be physiologic or represent enteritis. Clinical correlation is recommended. Loose stool within the colon compatible with diarrheal state. Correlation with clinical exam and stool cultures recommended. There is mild thickened appearance of the colon, likely related to underdistention. Mild colitis is less likely. There is no bowel obstruction. The appendix is normal. Vascular/Lymphatic: Mild aortoiliac atherosclerotic disease. The IVC is unremarkable. No portal venous gas. There is no adenopathy. Reproductive: Small calcified uterine fibroids. No adnexal masses. Other: None Musculoskeletal: Severe osseous metastatic disease relatively similar to prior CT. No acute osseous pathology. IMPRESSION: 1. Bilateral lower lobe predominant areas of ground-glass nodularity most concerning for pneumonia, likely atypical in etiology. Clinical correlation and follow-up to resolution recommended. 2. Diarrheal state with possible enteritis. Correlation with clinical exam and stool cultures recommended. No bowel obstruction. Normal appendix. 3. Underdistention of the colon versus less likely mild colitis. Clinical correlation is recommended. 4. Cholelithiasis. 5. Severe osseous metastatic disease relatively similar to prior CT. 6. Ill-defined area of hypodensity involving the left lobe of the liver appears progressed since the prior CT. 7. Aortic Atherosclerosis (ICD10-I70.0). Electronically Signed   By: Anner Crete M.D.   On: 05/14/2021 18:43     TODAY-DAY OF DISCHARGE:  Subjective:   Myrta [place] today has no headache,no chest abdominal pain,no new weakness tingling or numbness, feels much better wants to go home today.  Objective:   Blood pressure 126/73, pulse 82, temperature 98.5 F (36.9 C), temperature source Oral, resp. rate 18, height 4' 11"  (1.499 m), weight 58.1 kg, last menstrual period 03/04/2009, SpO2 100 %. No intake or output  data in the 24 hours ending 05/21/21 1502 Filed Weights   05/14/21 1529  Weight: 58.1 kg    Exam: Awake Alert, Oriented *3, No new F.N deficits, Normal affect [place].AT,PERRAL Supple Neck,No JVD, No cervical lymphadenopathy appriciated.  Symmetrical Chest wall movement, Good air movement bilaterally, CTAB RRR,No Gallops,Rubs or new Murmurs, No Parasternal Heave +ve B.Sounds, Abd Soft, Non tender, No organomegaly appriciated, No rebound -guarding or rigidity. No Cyanosis, Clubbing or edema, No new Rash or bruise   PERTINENT RADIOLOGIC STUDIES: No results found.   PERTINENT LAB RESULTS: CBC: Recent Labs    05/19/21 0538 05/21/21 0552  WBC 2.4* 2.4*  HGB 7.9* 7.3*  HCT 21.9* 20.2*  PLT 65* 56*   CMET CMP     Component Value Date/Time   NA 137 05/21/2021 0516   NA 135 (L) 09/08/2017 1539   K 3.5 05/21/2021 0516   K 3.3 (L) 09/08/2017 1539   CL 115 (H) 05/21/2021 0516   CL 103 01/16/2013 0816   CO2 16 (L) 05/21/2021 0516   CO2 21 (L) 09/08/2017 1539   GLUCOSE 83 05/21/2021 0516   GLUCOSE 84 09/08/2017 1539  GLUCOSE 95 01/16/2013 0816   BUN 50 (H) 05/21/2021 0516   BUN 52 (A) 01/16/2019 0000   BUN 38.0 (H) 09/08/2017 1539   CREATININE 2.13 (H) 05/21/2021 0516   CREATININE 3.63 (HH) 12/01/2019 1120   CREATININE 1.4 (H) 09/08/2017 1539   CALCIUM 8.6 (L) 05/21/2021 0516   CALCIUM 9.8 01/16/2020 1008   CALCIUM 8.6 09/08/2017 1539   PROT 5.1 (L) 05/19/2021 0538   PROT 8.2 09/08/2017 1539   ALBUMIN 2.6 (L) 05/19/2021 0538   ALBUMIN 2.6 (L) 09/08/2017 1539   AST 34 05/19/2021 0538   AST 13 (L) 12/01/2019 1120   AST 19 09/08/2017 1539   ALT 14 05/19/2021 0538   ALT 16 12/01/2019 1120   ALT 8 09/08/2017 1539   ALKPHOS 101 05/19/2021 0538   ALKPHOS 78 09/08/2017 1539   BILITOT 0.7 05/19/2021 0538   BILITOT 0.2 (L) 12/01/2019 1120   BILITOT 0.37 09/08/2017 1539   GFRNONAA 27 (L) 05/21/2021 0516   GFRNONAA 13 (L) 12/01/2019 1120   GFRAA 36 (L) 07/09/2020 0934    GFRAA 15 (L) 12/01/2019 1120    GFR Estimated Creatinine Clearance: 22.9 mL/min (A) (by C-G formula based on SCr of 2.13 mg/dL (H)). No results for input(s): LIPASE, AMYLASE in the last 72 hours. No results for input(s): CKTOTAL, CKMB, CKMBINDEX, TROPONINI in the last 72 hours. Invalid input(s): POCBNP Recent Labs    05/19/21 0538  DDIMER 14.56*   No results for input(s): HGBA1C in the last 72 hours. No results for input(s): CHOL, HDL, LDLCALC, TRIG, CHOLHDL, LDLDIRECT in the last 72 hours. No results for input(s): TSH, T4TOTAL, T3FREE, THYROIDAB in the last 72 hours.  Invalid input(s): FREET3 No results for input(s): VITAMINB12, FOLATE, FERRITIN, TIBC, IRON, RETICCTPCT in the last 72 hours. Coags: No results for input(s): INR in the last 72 hours.  Invalid input(s): PT Microbiology: Recent Results (from the past 240 hour(s))  Resp Panel by RT-PCR (Flu A&B, Covid) Nasopharyngeal Swab     Status: Abnormal   Collection Time: 05/14/21  4:33 PM   Specimen: Nasopharyngeal Swab; Nasopharyngeal(NP) swabs in vial transport medium  Result Value Ref Range Status   SARS Coronavirus 2 by RT PCR POSITIVE (A) NEGATIVE Final    Comment: RESULT CALLED TO, READ BACK BY AND VERIFIED WITH: STEPHANIE RUDD $RemoveBefore'@[id number]'[id number]$  05/14/21 MJU (NOTE) SARS-CoV-2 target nucleic acids are DETECTED.  The SARS-CoV-2 RNA is generally detectable in upper respiratory specimens during the acute phase of infection. Positive results are indicative of the presence of the identified virus, but do not rule out bacterial infection or co-infection with other pathogens not detected by the test. Clinical correlation with patient history and other diagnostic information is necessary to determine patient infection status. The expected result is Negative.  Fact Sheet for Patients: EntrepreneurPulse.com.au  Fact Sheet for Healthcare Providers: IncredibleEmployment.be  This test is not yet  approved or cleared by the [place] FDA and  has been authorized for detection and/or diagnosis of SARS-CoV-2 by FDA under an Emergency Use Authorization (EUA).  This EUA will remain in effect (meaning this test can be  used) for the duration of  the COVID-19 declaration under Section 564(b)(1) of the Act, 21 U.S.C. section 360bbb-3(b)(1), unless the authorization is terminated or revoked sooner.     Influenza A by PCR NEGATIVE NEGATIVE Final   Influenza B by PCR NEGATIVE NEGATIVE Final    Comment: (NOTE) The Xpert Xpress SARS-CoV-2/FLU/RSV plus assay is intended as an aid in the diagnosis of influenza  from Nasopharyngeal swab specimens and should not be used as a sole basis for treatment. Nasal washings and aspirates are unacceptable for Xpert Xpress SARS-CoV-2/FLU/RSV testing.  Fact Sheet for Patients: EntrepreneurPulse.com.au  Fact Sheet for Healthcare Providers: IncredibleEmployment.be  This test is not yet approved or cleared by the [place] FDA and has been authorized for detection and/or diagnosis of SARS-CoV-2 by FDA under an Emergency Use Authorization (EUA). This EUA will remain in effect (meaning this test can be used) for the duration of the COVID-19 declaration under Section 564(b)(1) of the Act, 21 U.S.C. section 360bbb-3(b)(1), unless the authorization is terminated or revoked.  Performed at [facility], [address] [place], [place] [address]   Blood Culture (routine x 2)     Status: Abnormal   Collection Time: 05/14/21  7:21 PM   Specimen: BLOOD  Result Value Ref Range Status   Specimen Description   Final    BLOOD RIGHT ANTECUBITAL Performed at [facility], [place] [place], [place] [address]    Special Requests   Final    BOTTLES DRAWN AEROBIC AND ANAEROBIC Blood Culture results may not be optimal due to an inadequate volume of blood received in culture bottles Performed at  [facility], [place] [place], [place] [address]    Culture  Setup Time   Final    GRAM POSITIVE COCCI IN BOTH AEROBIC AND ANAEROBIC BOTTLES CRITICAL RESULT CALLED TO, READ BACK BY AND VERIFIED WITH: NATHAN BLUE$RemoveBeforeDE'@0101'[id number]$  05/16/21 RH    Culture (A)  Final    STAPHYLOCOCCUS CAPITIS THE SIGNIFICANCE OF ISOLATING THIS ORGANISM FROM A SINGLE SET OF BLOOD CULTURES WHEN MULTIPLE SETS ARE DRAWN IS UNCERTAIN. PLEASE NOTIFY THE MICROBIOLOGY DEPARTMENT WITHIN ONE WEEK IF SPECIATION AND SENSITIVITIES ARE REQUIRED. Performed at [facility], [place] [address] [place], [place] [address]    Report Status 05/18/2021 FINAL  Final  Blood Culture (routine x 2)     Status: None   Collection Time: 05/14/21  7:21 PM   Specimen: BLOOD  Result Value Ref Range Status   Specimen Description BLOOD LEFT ANTECUBITAL  Final   Special Requests   Final    BOTTLES DRAWN AEROBIC AND ANAEROBIC Blood Culture results may not be optimal due to an inadequate volume of blood received in culture bottles   Culture   Final    NO GROWTH 5 DAYS Performed at [facility], [address] [place], [place] [address]    Report Status 05/19/2021 FINAL  Final  Blood Culture ID Panel (Reflexed)     Status: Abnormal   Collection Time: 05/14/21  7:21 PM  Result Value Ref Range Status   Enterococcus faecalis NOT DETECTED NOT DETECTED Final   Enterococcus Faecium NOT DETECTED NOT DETECTED Final   Listeria monocytogenes NOT DETECTED NOT DETECTED Final   Staphylococcus species DETECTED (A) NOT DETECTED Final    Comment: CRITICAL RESULT CALLED TO, READ BACK BY AND VERIFIED WITH: NATHAN BLUE $RemoveBef'@0101'[email or web address]$  05/16/21 RH    Staphylococcus aureus (BCID) NOT DETECTED NOT DETECTED Final   Staphylococcus epidermidis NOT DETECTED NOT DETECTED Final   Staphylococcus lugdunensis NOT DETECTED NOT DETECTED Final   Streptococcus species NOT DETECTED NOT DETECTED Final   Streptococcus agalactiae NOT DETECTED NOT DETECTED Final    Streptococcus pneumoniae NOT DETECTED NOT DETECTED Final   Streptococcus pyogenes NOT DETECTED NOT DETECTED Final   A.calcoaceticus-baumannii NOT DETECTED NOT DETECTED Final   Bacteroides fragilis NOT DETECTED NOT DETECTED Final   Enterobacterales  NOT DETECTED NOT DETECTED Final   Enterobacter cloacae complex NOT DETECTED NOT DETECTED Final   Escherichia coli NOT DETECTED NOT DETECTED Final   Klebsiella aerogenes NOT DETECTED NOT DETECTED Final   Klebsiella oxytoca NOT DETECTED NOT DETECTED Final   Klebsiella pneumoniae NOT DETECTED NOT DETECTED Final   Proteus species NOT DETECTED NOT DETECTED Final   Salmonella species NOT DETECTED NOT DETECTED Final   Serratia marcescens NOT DETECTED NOT DETECTED Final   Haemophilus influenzae NOT DETECTED NOT DETECTED Final   Neisseria meningitidis NOT DETECTED NOT DETECTED Final   Pseudomonas aeruginosa NOT DETECTED NOT DETECTED Final   Stenotrophomonas maltophilia NOT DETECTED NOT DETECTED Final   Candida albicans NOT DETECTED NOT DETECTED Final   Candida auris NOT DETECTED NOT DETECTED Final   Candida glabrata NOT DETECTED NOT DETECTED Final   Candida krusei NOT DETECTED NOT DETECTED Final   Candida parapsilosis NOT DETECTED NOT DETECTED Final   Candida tropicalis NOT DETECTED NOT DETECTED Final   Cryptococcus neoformans/gattii NOT DETECTED NOT DETECTED Final    Comment: Performed at [facility], [address] [place], [place] [address]  CULTURE, BLOOD (ROUTINE X 2) w Reflex to ID Panel     Status: None   Collection Time: 05/16/21 10:13 PM   Specimen: BLOOD  Result Value Ref Range Status   Specimen Description BLOOD BLOOD RIGHT WRIST  Final   Special Requests   Final    IN PEDIATRIC BOTTLE Blood Culture results may not be optimal due to an excessive volume of blood received in culture bottles   Culture   Final    NO GROWTH 5 DAYS Performed at [facility], [address] [place], [place] [address]    Report Status  05/21/2021 FINAL  Final  CULTURE, BLOOD (ROUTINE X 2) w Reflex to ID Panel     Status: None   Collection Time: 05/16/21 10:13 PM   Specimen: BLOOD  Result Value Ref Range Status   Specimen Description BLOOD BLOOD RIGHT HAND  Final   Special Requests   Final    IN PEDIATRIC BOTTLE Blood Culture results may not be optimal due to an excessive volume of blood received in culture bottles   Culture   Final    NO GROWTH 5 DAYS Performed at [facility], [address] [place], [place] [address]    Report Status 05/21/2021 FINAL  Final    FURTHER DISCHARGE INSTRUCTIONS:  Get Medicines reviewed and adjusted: Please take all your medications with you for your next visit with your Primary MD  Laboratory/radiological data: Please request your Primary MD to go over all hospital tests and procedure/radiological results at the follow up, please ask your Primary MD to get all Hospital records sent to his/her office.  In some cases, they will be blood work, cultures and biopsy results pending at the time of your discharge. Please request that your primary care M.D. goes through all the records of your hospital data and follows up on these results.  Also Note the following: If you experience worsening of your admission symptoms, develop shortness of breath, life threatening emergency, suicidal or homicidal thoughts you must seek medical attention immediately by calling 911 or calling your MD immediately  if symptoms less severe.  You must read complete instructions/literature along with all the possible adverse reactions/side effects for all the Medicines you take and that have been prescribed to you. Take any new Medicines after you have completely understood and accpet all the possible  adverse reactions/side effects.   Do not drive when taking Pain medications or sleeping medications (Benzodaizepines)  Do not take more than prescribed Pain, Sleep and Anxiety Medications. It is not  advisable to combine anxiety,sleep and pain medications without talking with your primary care practitioner  Special Instructions: If you have smoked or chewed Tobacco  in the last 2 yrs please stop smoking, stop any regular Alcohol  and or any Recreational drug use.  Wear Seat belts while driving.  Please note: You were cared for by a hospitalist during your hospital stay. Once you are discharged, your primary care physician will handle any further medical issues. Please note that NO REFILLS for any discharge medications will be authorized once you are discharged, as it is imperative that you return to your primary care physician (or establish a relationship with a primary care physician if you do not have one) for your post hospital discharge needs so that they can reassess your need for medications and monitor your lab values.  Total Time spent coordinating discharge including counseling, education and face to face time equals 35 minutes.  SignedOren Binet 05/21/2021 3:02 PM

## 2021-05-22 ENCOUNTER — Telehealth: Payer: Self-pay

## 2021-05-22 NOTE — Telephone Encounter (Signed)
Transition Care Management Follow-up Telephone Call Date of discharge and from where: 05/21/2021, [facility] How have you been since you were released from the hospital? Patient is doing better now. Any questions or concerns? No  Items Reviewed: Did the pt receive and understand the discharge instructions provided? Yes  Medications obtained and verified? Yes  Other? No  Any new allergies since your discharge? No  Dietary orders reviewed? Yes Do you have support at home? Yes   Home Care and Equipment/Supplies: Were home health services ordered? yes If so, what is the name of the agency? unknown  Has the agency set up a time to come to the patient's home? yes Were any new equipment or medical supplies ordered?  No What is the name of the medical supply agency? N/A Were you able to get the supplies/equipment? not applicable Do you have any questions related to the use of the equipment or supplies? No  Functional Questionnaire: (I = Independent and D = Dependent) ADLs: I  Bathing/Dressing- I  Meal Prep- I  Eating- I  Maintaining continence- I  Transferring/Ambulation- I  Managing Meds- I  Follow up appointments reviewed:  PCP Hospital f/u appt confirmed? Yes  Scheduled to see Dr. Danise Mina on 06/02/2021 @ 3:30 pm. [facility] f/u appt confirmed? Yes  Scheduled to see   on nephrology. Are transportation arrangements needed? No  If their condition worsens, is the pt aware to call PCP or go to the Emergency Dept.? Yes Was the patient provided with contact information for the PCP's office or ED? Yes Was to pt encouraged to call back with questions or concerns? Yes

## 2021-05-23 ENCOUNTER — Other Ambulatory Visit: Payer: Self-pay

## 2021-05-23 ENCOUNTER — Encounter: Payer: Self-pay | Admitting: Emergency Medicine

## 2021-05-23 ENCOUNTER — Emergency Department
Admission: EM | Admit: 2021-05-23 | Discharge: 2021-05-25 | Disposition: A | Discharge status: Home / Self Care | Payer: No Typology Code available for payment source | Attending: Emergency Medicine | Admitting: Emergency Medicine

## 2021-05-23 ENCOUNTER — Telehealth: Payer: Self-pay

## 2021-05-23 ENCOUNTER — Emergency Department: Payer: No Typology Code available for payment source

## 2021-05-23 DIAGNOSIS — Z85830 Personal history of malignant neoplasm of bone: Secondary | ICD-10-CM | POA: Diagnosis not present

## 2021-05-23 DIAGNOSIS — Z8616 Personal history of COVID-19: Secondary | ICD-10-CM | POA: Diagnosis not present

## 2021-05-23 DIAGNOSIS — I5022 Chronic systolic (congestive) heart failure: Secondary | ICD-10-CM | POA: Diagnosis not present

## 2021-05-23 DIAGNOSIS — I130 Hypertensive heart and chronic kidney disease with heart failure and stage 1 through stage 4 chronic kidney disease, or unspecified chronic kidney disease: Secondary | ICD-10-CM | POA: Insufficient documentation

## 2021-05-23 DIAGNOSIS — R0602 Shortness of breath: Secondary | ICD-10-CM | POA: Diagnosis not present

## 2021-05-23 DIAGNOSIS — Z8579 Personal history of other malignant neoplasms of lymphoid, hematopoietic and related tissues: Secondary | ICD-10-CM | POA: Diagnosis not present

## 2021-05-23 DIAGNOSIS — Z853 Personal history of malignant neoplasm of breast: Secondary | ICD-10-CM | POA: Diagnosis not present

## 2021-05-23 DIAGNOSIS — J029 Acute pharyngitis, unspecified: Secondary | ICD-10-CM | POA: Insufficient documentation

## 2021-05-23 DIAGNOSIS — N184 Chronic kidney disease, stage 4 (severe): Secondary | ICD-10-CM | POA: Insufficient documentation

## 2021-05-23 DIAGNOSIS — Z79899 Other long term (current) drug therapy: Secondary | ICD-10-CM | POA: Insufficient documentation

## 2021-05-23 LAB — CBC
HCT: 26 % — ABNORMAL LOW (ref 36.0–46.0)
Hemoglobin: 8.8 g/dL — ABNORMAL LOW (ref 12.0–15.0)
MCH: 26.7 pg (ref 26.0–34.0)
MCHC: 33.8 g/dL (ref 30.0–36.0)
MCV: 78.8 fL — ABNORMAL LOW (ref 80.0–100.0)
Platelets: 62 10*3/uL — ABNORMAL LOW (ref 150–400)
RBC: 3.3 MIL/uL — ABNORMAL LOW (ref 3.87–5.11)
RDW: 19.8 % — ABNORMAL HIGH (ref 11.5–15.5)
WBC: 3.2 10*3/uL — ABNORMAL LOW (ref 4.0–10.5)
nRBC: 1.2 % — ABNORMAL HIGH (ref 0.0–0.2)

## 2021-05-23 LAB — BASIC METABOLIC PANEL
Anion gap: 7 (ref 5–15)
BUN: 32 mg/dL — ABNORMAL HIGH (ref 6–20)
CO2: 19 mmol/L — ABNORMAL LOW (ref 22–32)
Calcium: 8.9 mg/dL (ref 8.9–10.3)
Chloride: 114 mmol/L — ABNORMAL HIGH (ref 98–111)
Creatinine, Ser: 2.04 mg/dL — ABNORMAL HIGH (ref 0.44–1.00)
GFR, Estimated: 28 mL/min — ABNORMAL LOW (ref >60)
Glucose, Bld: 100 mg/dL — ABNORMAL HIGH (ref 70–99)
Potassium: 3.4 mmol/L — ABNORMAL LOW (ref 3.5–5.1)
Sodium: 140 mmol/L (ref 135–145)

## 2021-05-23 LAB — GROUP A STREP BY PCR: Group A Strep by PCR: NOT DETECTED

## 2021-05-23 LAB — TROPONIN I (HIGH SENSITIVITY): Troponin I (High Sensitivity): 21 ng/L — ABNORMAL HIGH (ref <18)

## 2021-05-23 MED ORDER — NYSTATIN 100000 UNIT/ML MT SUSP: 5.0000 mL | Freq: Four times a day (QID) | OROMUCOSAL | 0 refills | Status: AC

## 2021-05-23 MED ORDER — NYSTATIN 100000 UNIT/ML MT SUSP
5.0000 mL | Freq: Once | OROMUCOSAL | Status: AC
  Administered 2021-05-23: 500000 [IU] via ORAL
  Filled 2021-05-23 (×2): qty 5

## 2021-05-23 NOTE — Telephone Encounter (Signed)
Called Melanie Holloway back to get clarification on HH orders she was calling to get. Elder Cyphers, PT wants to do PT once a week x 1 week and then twice a week x 4 weeks. Also needed to report patient is not taking sodium bicarbonate, questran and ferriprox after being discharged from hospital.

## 2021-05-23 NOTE — ED Triage Notes (Signed)
Pts son reports that pt was just discharged a few days ago for [facility]. She is still having SHOB and he reports that when she talks she does not have a voice.

## 2021-05-23 NOTE — Telephone Encounter (Signed)
[facility] - Client TELEPHONE ADVICE RECORD [facility] Patient Name: Melanie Holloway Gender: Female DOB: 1966-09-29 Age: 55 Y 63 M 24 D Return Phone Number: [phone] (Primary), [phone] (Secondary) Address[address] [place] [address] Client [facility] - Client Client Site [place] Physician Ria Bush - MD Contact Type Call Who Is Calling Patient / Member / Family / Caregiver Call Type Triage / Clinical Relationship To Patient Self Return Phone Number [phone] (Secondary) Chief Complaint HOARSENESS - sudden and only if following an injury Reason for Call Symptomatic / Request for Health Information Initial Comment Caller states she is calling from Dr. Danise Mina office. She is really horse and hard to understand. That phone number is probably incorrect I am so sorry. Translation No Nurse Assessment Nurse: Martyn Ehrich, RN, [place] Date/Time (Eastern Time): 05/23/2021 3:05:33 PM Confirm and document reason for call. If symptomatic, describe symptoms. ---She is very hoarse and mild trouble breathing. The hoarseness onset 2 wks ago. Her husband had covid in the last wks. No fever. No cough or wheeze. She was in the hospital for covid and pneumonia. Breathing is not worse than when she left hospital 2 d ago. No fever Does the patient have any new or worsening symptoms? ---Yes Will a triage be completed? ---Yes Related visit to physician within the last 2 weeks? ---Yes Does the PT have any chronic conditions? (i.e. diabetes, asthma, this includes High risk factors for pregnancy, etc.) ---Yes List chronic conditions. ---asthma kidney failure Is this a behavioral health or substance abuse call? ---No Guidelines Guideline Title Affirmed Question Affirmed Notes Nurse Date/Time (Eastern Time) COVID-19 - Diagnosed or Suspected MILD difficulty breathing  (e.g., minimal/no SOB at rest, SOB with walking, pulse <765) Gaddy, RN, Solmon Ice 01/22/5034 4:65:68 PM PLEASE NOTE: All timestamps contained within this report are represented as [place] Standard Time. CONFIDENTIALTY NOTICE: This fax transmission is intended only for the addressee. It contains information that is legally privileged, confidential or otherwise protected from use or disclosure. If you are not the intended recipient, you are strictly prohibited from reviewing, disclosing, copying using or disseminating any of this information or taking any action in reliance on or regarding this information. If you have received this fax in error, please notify us immediately by telephone so that we can arrange for its return to [place]. Phone: [phone], Toll-Free: [phone], Fax: [phone] Page: 2 of 3 Call Id: [id number] Guidelines Guideline Title Affirmed Question Affirmed Notes Nurse Date/Time Eilene Ghazi Time) Hoarseness [1] Stridor AND [2] difficulty breathing Martyn Ehrich, RN, [facility] 9/0/3009 2:33:00 PM Disp. Time Eilene Ghazi Time) Disposition Final User 05/23/2021 2:58:10 PM Send to Urgent Gwenlyn Found, [place] 05/23/2021 3:17:00 PM See HCP within 4 Hours (or PCP triage) Martyn Ehrich, RN, [place] 04/23/2262 3:35:45 PM 911 Outcome Documentation Gaddy, RN, [place] Ice Reason: Ruven Corradi a nurse got on the line and reported to her that we need her to call 911 to PT bc pt was not able to speak clearly enough to provide address to the triage nurse. (She was triaged with Yes or No slowly). She called EMS to the pt and will try to reach pt / and son 05/23/2021 3:26:15 PM Call EMS 911 Now Yes Gaddy, RN, [place] Disagree/Comply Comply Caller Understands Yes PreDisposition Did not know what to do Care Advice Given Per Guideline SEE HCP (OR PCP TRIAGE) WITHIN 4 HOURS: * IF OFFICE WILL BE OPEN: You need to be seen within the next 3  or 4 hours. Call your doctor (or NP/PA) now or as soon as the office opens. CALL  EMS 911 NOW: * Immediate medical attention is needed. You need to hang up and call 911 (or an ambulance). * Triager Discretion: I'll call you back in a few minutes to be sure you were able to reach them. Comments User: Daphene Calamity, RN Date/Time Eilene Ghazi Time): 05/23/2021 3:02:34 PM had a problem getting connected with urgent line User: Daphene Calamity, RN Date/Time Eilene Ghazi Time): 05/23/2021 3:04:33 PM Reached Sonny at backline to see if they knew pts phone no. after trying to reach her 2x and line was busy - Isabell Jarvis said this pts phone no. is [phone] User: Daphene Calamity, RN Date/Time Eilene Ghazi Time): 05/23/2021 3:11:42 PM PLEASE NOTE: All timestamps contained within this report are represented as [place] Standard Time. CONFIDENTIALTY NOTICE: This fax transmission is intended only for the addressee. It contains information that is legally privileged, confidential or otherwise protected from use or disclosure. If you are not the intended recipient, you are strictly prohibited from reviewing, disclosing, copying using or disseminating any of this information or taking any action in reliance on or regarding this information. If you have received this fax in error, please notify us immediately by telephone so that we can arrange for its return to [place]. Phone: [phone], Toll-Free: [phone], Fax: [phone] Page: 3 of 3 Call Id: [id number] Comments she got covid 2 wks ago User: Daphene Calamity, RN Date/Time Eilene Ghazi Time): 05/23/2021 3:21:17 PM not weaker than when she was discharged - she is having PT at home User: Daphene Calamity, RN Date/Time Eilene Ghazi Time): 05/23/2021 3:23:48 PM the hoarseness started after discharge User: Daphene Calamity, RN Date/Time (Eastern Time): 05/23/2021 3:27:17 PM she agress same address as MD records at office - she is hard to hear her or understand her so will call office and call 911 to her and she agreed to this plan User: Daphene Calamity, RN Date/Time (Eastern Time):  05/23/2021 3:29:01 PM Pt is going to call son while nurse calls [phone] User: Daphene Calamity, RN Date/Time (Eastern Time): 05/23/2021 3:32:34 PM tried to reach backline and it was busy then reached Raquel Sarna at backline -- told Raquel Sarna we need to call 911 to pts address on record bc she cannot speak clearly enough to call. Was put on hold for Raquel Sarna to find a nurse to speak with this nurse User: Daphene Calamity, RN Date/Time Eilene Ghazi Time): 05/23/2021 3:42:50 PM Carlon Chaloux a nurse got on the line and reported to her that we need her to call 911 to PT bc pt was not able to speak clearly enough to provide address to the triage nurse. (She was triaged with Yes or No slowly). She called EMS to the pt and will try to reach pt / and son Referrals [place] UNDECIDED

## 2021-05-23 NOTE — ED Notes (Signed)
awaiting for nystatin from pharmacy

## 2021-05-23 NOTE — Telephone Encounter (Signed)
Elder Cyphers PT with [facility] returning [place] call; unable to find note in pts chart. Sending note to [facility].
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
## 2021-05-23 NOTE — Telephone Encounter (Signed)
See notes below access nurse note. °

## 2021-05-23 NOTE — ED Notes (Signed)
Pt gave verbal dc knowledge all questions answered

## 2021-05-23 NOTE — Telephone Encounter (Addendum)
Agree with [facility] request.  Will review meds (Na bicarb, questran, ferriprox) at OV Pt going to ER today for episode of aphasia.

## 2021-05-23 NOTE — ED Notes (Signed)
Patient provided discharge instructions by Angelyn Punt, RN. Patient discharged by Angelyn Punt, RN.

## 2021-05-23 NOTE — Telephone Encounter (Signed)
Noted. Thanks agree with ER eval for ?aphasia

## 2021-05-23 NOTE — Telephone Encounter (Signed)
Melanie Holloway with access nurse said that she was talking with Melanie Holloway but Melanie Holloway cannot talk and access nurse does not know why but request I call 911 EMS because Access nurse can contact EMS but [place]. Melanie Holloway said Melanie Holloway was alert and can walk with walker.I called EMS [place] and EMS was dispatched to Melanie Holloway address listed in chart; EMS wanted to verify that the front door would be unlocked. I spoke with Melanie Holloway with access nurse and she thought Melanie Holloway was home alone and had already disconnected call with Melanie Holloway so Melanie Holloway could call her husband. I called the Husband who was giving me specific instructions for EMS to follow to find the house since there is more than [address] in [place].  Pts husband said that Melanie Holloway was not alone that [place] a friend was with the Melanie Holloway. I called [place] [facility] to give them the husbands instructions and EMS dispatcher said that someone had just called and cancelled the 911 call. I called pts husband and said he had just gotten off phone and Melanie Holloway, pts friend was taking Melanie Holloway to [facility] ED by car. Gave EMS precautions and Melanie Holloway husband voiced understanding. Will attach access nurse note when comes over portal. Sending note to Dr Darnell Level and Lattie Haw CMA.

## 2021-05-26 NOTE — Telephone Encounter (Signed)
Spoke with Melanie Holloway relaying Dr. Synthia Innocent message.  She verbalizes understanding and expresses her thanks.

## 2021-06-02 ENCOUNTER — Ambulatory Visit (INDEPENDENT_AMBULATORY_CARE_PROVIDER_SITE_OTHER): Payer: No Typology Code available for payment source | Admitting: Family Medicine

## 2021-06-02 ENCOUNTER — Other Ambulatory Visit: Payer: Self-pay

## 2021-06-02 ENCOUNTER — Encounter: Payer: Self-pay | Admitting: Family Medicine

## 2021-06-02 VITALS — BP 114/68 | HR 78 | Temp 97.8°F | Ht 59.0 in | Wt 127.4 lb

## 2021-06-02 DIAGNOSIS — M329 Systemic lupus erythematosus, unspecified: Secondary | ICD-10-CM

## 2021-06-02 DIAGNOSIS — R531 Weakness: Secondary | ICD-10-CM | POA: Diagnosis not present

## 2021-06-02 DIAGNOSIS — I5022 Chronic systolic (congestive) heart failure: Secondary | ICD-10-CM

## 2021-06-02 DIAGNOSIS — C7951 Secondary malignant neoplasm of bone: Secondary | ICD-10-CM

## 2021-06-02 DIAGNOSIS — D631 Anemia in chronic kidney disease: Secondary | ICD-10-CM

## 2021-06-02 DIAGNOSIS — N189 Chronic kidney disease, unspecified: Secondary | ICD-10-CM

## 2021-06-02 DIAGNOSIS — N184 Chronic kidney disease, stage 4 (severe): Secondary | ICD-10-CM

## 2021-06-02 DIAGNOSIS — A0839 Other viral enteritis: Secondary | ICD-10-CM

## 2021-06-02 DIAGNOSIS — M3214 Glomerular disease in systemic lupus erythematosus: Secondary | ICD-10-CM

## 2021-06-02 DIAGNOSIS — C50919 Malignant neoplasm of unspecified site of unspecified female breast: Secondary | ICD-10-CM

## 2021-06-02 DIAGNOSIS — Z8616 Personal history of COVID-19: Secondary | ICD-10-CM | POA: Diagnosis not present

## 2021-06-02 DIAGNOSIS — J1282 Pneumonia due to coronavirus disease 2019: Secondary | ICD-10-CM | POA: Diagnosis not present

## 2021-06-02 NOTE — Patient Instructions (Addendum)
Wheelchair prescription provided today.  Let us know if concern over new pressure sores.  Keep upcoming appointment with cancer center.  Return in 2-3 months for physical

## 2021-06-02 NOTE — Progress Notes (Signed)
Patient ID: Melanie Holloway, female    DOB: 1966-04-12, 55 y.o.   MRN: [id number]  This visit was conducted in person.   BP 114/68   Pulse 78   Temp 97.8 F (36.6 C) (Temporal)   Ht 4\' 11"  (1.499 m)   Wt 127 lb 6 oz (57.8 kg)   LMP 03/04/2009 (Approximate)   BMI 25.73 kg/m    CC: hosp f/u visit  Subjective:   HPI: Melanie Holloway is a 55 y.o. female presenting on 06/02/2021 for Hospitalization Follow-up (Seen on 05/23/21 at [facility] ED, dx SOB; pharyngitis.  Pt accompanied by daughter, Nia- temp 97.8.)   Known lupus nephritis, CKD stage 4, metastatic breast cancer (s/p mastectomy, chemo, radiation therapy), chronic systolic CHF, chronic anemia. Recent hospitalization for nausea/vomiting, diarrhea and hypoxic respiratory failure presenting with dyspnea - diagnosed with COVID19 infection. Thought had COVID gastroenteritis and pneumonia. Treated with remdesivir course with improvement. Metoprolol and demadex were restarted, entresto was held.   Continues plaquenil and cellcept for lupus nephritis as well as demadex.  She did receive aranesp injection prior to discharge.  She was noted to have stage II pressure sore to L buttock - denies significant trouble at this time.   Breast cancer - capecitabine (Xeloda) was continued  Questions regarding deferiprone (chelating agent), sodium bicarb, questran - deferiprone was unaffordable, has not been taking Na Bicarb, only takes questran PRN.   Returned to the ER for ongoing shortness of breath - CXR reassuring. Treated empirically with nystatin for possible esophageal thrush. This has resolved.   [facility] involved - recommended wheelchair Rx. No SN or Nurse aid involved - she would benefit from this.  She has fallen 3 times at home - once in bedroom, twice in bathroom. Hit left knee - recovering well with ice pack.    Admit Date: 05/14/2021 Discharge date: 05/21/2021 TCM hosp f/u phone call completed: 05/22/2021  Recommendations for Outpatient  Follow-up:  Follow up with PCP in 1-2 weeks Please obtain CMP/CBC in one week Please ensure follow-up with nephrology.   Discharge Diagnoses:  Principal Problem:   Pneumonia due to COVID-19 virus Active Problems:   Essential hypertension   Metastatic breast cancer ([place])   Anemia associated with stage 4 chronic renal failure (HCC)   Lupus erythematosus tumidus   Leukopenia   Hypokalemia due to excessive gastrointestinal loss of potassium   Chronic systolic CHF (congestive heart failure) (HCC)   Acute kidney injury superimposed on CKD IV (HCC)   Gastroenteritis due to COVID-19 virus   Increased anion gap metabolic acidosis  Disposition: Home with home health services Equipment/Devices: None  Discharge Condition: Stable CODE STATUS: FULL CODE     Relevant past medical, surgical, family and social history reviewed and updated as indicated. Interim medical history since our last visit reviewed. Allergies and medications reviewed and updated. Outpatient Medications Prior to Visit  Medication Sig Dispense Refill   acetaminophen (TYLENOL) 325 MG tablet Take 2 tablets (650 mg total) by mouth every 4 (four) hours as needed for headache or mild pain. 30 tablet 0   capecitabine (XELODA) 500 MG tablet TAKE 2 TABLETS BY MOUTH TWICE DAILY AFTER A MEAL FOR 14 DAYS, THEN DO NOT TAKE FOR 7 DAYS. REPEAT. 56 tablet 5   clobetasol cream (TEMOVATE) 5.36 % Apply 1 application topically 2 (two) times daily.     Deferiprone (FERRIPROX) 1000 MG TABS Take 1 tablet by mouth in the morning and at bedtime. 180 tablet 1  hydroxychloroquine (PLAQUENIL) 200 MG tablet Take 1 tablet (200 mg total) by mouth daily.     loperamide (IMODIUM) 2 MG capsule Take 1 capsule (2 mg total) by mouth as needed for diarrhea or loose stools. Take 1 capsule after the first diarrhea event, and repeat with each additional diarrhea, maximum 6 tablets per day 60 capsule 0   mycophenolate (CELLCEPT) 250 MG capsule Take 2 capsules  (500 mg total) by mouth 2 (two) times daily. 60 capsule 0   ondansetron (ZOFRAN) 8 MG tablet Take 1 tablet (8 mg total) by mouth 2 (two) times daily as needed for nausea or vomiting. 40 tablet 0   potassium chloride (K-DUR) 10 MEQ tablet Take 10 mEq by mouth every evening.      torsemide (DEMADEX) 20 MG tablet Take 2 tablets (40 mg total) by mouth 2 (two) times daily. 60 tablet 0   cholestyramine (QUESTRAN) 4 g packet Take 1 packet (4 g total) by mouth daily. 20 each 12   metoprolol succinate (TOPROL-XL) 50 MG 24 hr tablet Take 1 tablet (50 mg total) by mouth daily. Take with or immediately following a meal. 30 tablet 0   sodium bicarbonate 650 MG tablet Take 1 tablet (650 mg total) by mouth 2 (two) times daily. 60 tablet 0   cholestyramine (QUESTRAN) 4 g packet Take 1 packet (4 g total) by mouth daily as needed (diarrhea).     metoprolol succinate (TOPROL-XL) 25 MG 24 hr tablet Take 1 tablet (25 mg total) by mouth daily. Take with or immediately following a meal.     Facility-Administered Medications Prior to Visit  Medication Dose Route Frequency Provider Last Rate Last Admin   0.9 %  sodium chloride infusion   Intravenous PRN Causey, Charlestine Massed, NP         Per HPI unless specifically indicated in ROS section below Review of Systems  Objective:  BP 114/68   Pulse 78   Temp 97.8 F (36.6 C) (Temporal)   Ht 4\' 11"  (1.499 m)   Wt 127 lb 6 oz (57.8 kg)   LMP 03/04/2009 (Approximate)   BMI 25.73 kg/m   Wt Readings from Last 3 Encounters:  06/02/21 127 lb 6 oz (57.8 kg)  05/23/21 128 lb (58.1 kg)  05/14/21 128 lb (58.1 kg)      Physical Exam Vitals and nursing note reviewed.  Constitutional:      Appearance: Normal appearance. She is ill-appearing.     Comments: Ambulates with walker  Cardiovascular:     Rate and Rhythm: Normal rate and regular rhythm.     Pulses: Normal pulses.     Heart sounds: Normal heart sounds. No murmur heard. Pulmonary:     Effort: Pulmonary  effort is normal. No respiratory distress.     Breath sounds: Normal breath sounds. No wheezing, rhonchi or rales.  Skin:    General: Skin is warm and dry.     Findings: No rash.     Comments: No evidence of pressure sore to bilateral buttocks   Neurological:     Mental Status: She is alert.  Psychiatric:        Mood and Affect: Mood normal.        Behavior: Behavior normal.      Results for orders placed or performed during the hospital encounter of 05/23/21  Group A Strep by PCR ([facility] Only)   Specimen: Throat; Sterile Swab  Result Value Ref Range   Group A Strep by PCR NOT DETECTED  NOT DETECTED  Basic metabolic panel  Result Value Ref Range   Sodium 140 135 - 145 mmol/L   Potassium 3.4 (L) 3.5 - 5.1 mmol/L   Chloride 114 (H) 98 - 111 mmol/L   CO2 19 (L) 22 - 32 mmol/L   Glucose, Bld 100 (H) 70 - 99 mg/dL   BUN 32 (H) 6 - 20 mg/dL   Creatinine, Ser 2.04 (H) 0.44 - 1.00 mg/dL   Calcium 8.9 8.9 - 10.3 mg/dL   GFR, Estimated 28 (L) >60 mL/min   Anion gap 7 5 - 15  CBC  Result Value Ref Range   WBC 3.2 (L) 4.0 - 10.5 K/uL   RBC 3.30 (L) 3.87 - 5.11 MIL/uL   Hemoglobin 8.8 (L) 12.0 - 15.0 g/dL   HCT 26.0 (L) 36.0 - 46.0 %   MCV 78.8 (L) 80.0 - 100.0 fL   MCH 26.7 26.0 - 34.0 pg   MCHC 33.8 30.0 - 36.0 g/dL   RDW 19.8 (H) 11.5 - 15.5 %   Platelets 62 (L) 150 - 400 K/uL   nRBC 1.2 (H) 0.0 - 0.2 %  Troponin I (High Sensitivity)  Result Value Ref Range   Troponin I (High Sensitivity) 21 (H) <18 ng/L    Assessment & Plan:  This visit occurred during the SARS-CoV-2 public health emergency.  Safety protocols were in place, including screening questions prior to the visit, additional usage of staff PPE, and extensive cleaning of exam room while observing appropriate contact time as indicated for disinfecting solutions.   Problem List Items Addressed This Visit     Metastatic breast cancer ([place])    Appreciate onc care Currently not taking deferiprone due to cost.  Has  upcoming cardiology appt.       Lupus nephritis, ISN/RPS class V ([place])    Just saw renal, note reviewed.       Bone metastases ([place])   Relevant Orders   DME Wheelchair manual   Lupus ([place])   Chronic systolic CHF (congestive heart failure) ([place])    Followed by cardiology. Continues torsemide and toprol XL (recent dose reduction). entresto remains on hold.       Relevant Medications   metoprolol succinate (TOPROL-XL) 25 MG 24 hr tablet   cholestyramine (QUESTRAN) 4 g packet   CKD (chronic kidney disease) stage 4, GFR 15-29 ml/min (HCC)   Anemia associated with chronic renal failure    Continues aranesp through renal.       Pneumonia due to COVID-19 virus - Primary    Treated with remdesivir in hospital with improvement. However with residual general weakness. I will see if we can add SN and nurse aide to home health PT already involved. I also provided written Rx for wheelchair to take home, and will order via EMR.       Relevant Orders   DME Wheelchair manual   Gastroenteritis due to COVID-19 virus    Symptoms have largely resolved.       General weakness    Ongoing. Uses rollator for ambulation however has had several recent falls at home due to weakness. Will Rx wheelchair.  No signs of pressure sores at this time.  No lags today as upcoming labs planned through oncology.  She is not currently taking sodium bicarb.       Relevant Orders   DME Wheelchair manual     No orders of the defined types were placed in this encounter.  Orders Placed This Encounter  Procedures  DME Wheelchair manual    Patient suffers from metastatic breast cancer, CKD and recent COVID infection s/p hospitalization which impairs their ability to perform daily activities like bathing and toileting in the home.  A cane or walker will not resolve issue with performing activities of daily living. A wheelchair will allow patient to safely perform daily activities. Patient can safely propel the  wheelchair in the home or has a caregiver who can provide assistance. Length of need 6 months . Accessories: elevating leg rests (ELRs), wheel locks, extensions and anti-tippers.      Patient Instructions  Wheelchair prescription provided today.  Let us know if concern over new pressure sores.  Keep upcoming appointment with cancer center.  Return in 2-3 months for physical   Follow up plan: Return in about 3 months (around 09/02/2021) for medicare wellness visit, annual exam, prior fasting for blood work.  Ria Bush, MD

## 2021-06-03 ENCOUNTER — Telehealth: Payer: Self-pay | Admitting: Family Medicine

## 2021-06-03 NOTE — Telephone Encounter (Signed)
Returning phone call from [facility]

## 2021-06-03 NOTE — Assessment & Plan Note (Signed)
Symptoms have largely resolved.

## 2021-06-03 NOTE — Assessment & Plan Note (Signed)
Continues aranesp through renal.

## 2021-06-03 NOTE — Assessment & Plan Note (Signed)
Just saw renal, note reviewed.

## 2021-06-03 NOTE — Assessment & Plan Note (Addendum)
Ongoing. Uses rollator for ambulation however has had several recent falls at home due to weakness. Will Rx wheelchair.  No signs of pressure sores at this time.  No lags today as upcoming labs planned through oncology.  She is not currently taking sodium bicarb.

## 2021-06-03 NOTE — Assessment & Plan Note (Addendum)
Appreciate onc care Currently not taking deferiprone due to cost.  Has upcoming cardiology appt.

## 2021-06-03 NOTE — Assessment & Plan Note (Signed)
Followed by cardiology. Continues torsemide and toprol XL (recent dose reduction). entresto remains on hold.

## 2021-06-03 NOTE — Telephone Encounter (Addendum)
Spoke with Lovena Le, of [place] [phone]), relaying Dr. Synthia Innocent message. States the nurse, Jonelle Sidle, is in a meeting right now but will have her call back.  Need to relay Dr. Synthia Innocent message to [facility] when she calls.

## 2021-06-03 NOTE — Telephone Encounter (Signed)
Can we call [facility] and ask to add on nurse aide and skilled nursing for patient s/p recent hospitalization for COVID pneumonia/gastroenteritis?  I also placed DME order for wheelchair.  (See note in chart dated 05/23/2021)

## 2021-06-03 NOTE — Telephone Encounter (Signed)
Spoke with Tiffany relaying Dr. Synthia Innocent message.  She verbalizes understanding and will add requested services for pt.    Also, she states [employer] handles DME orders for them now.  Order can be faxed to [phone].  I sent a Community Msg to [place] for DME wheelchair order.

## 2021-06-03 NOTE — Assessment & Plan Note (Signed)
Treated with remdesivir in hospital with improvement. However with residual general weakness. I will see if we can add SN and nurse aide to home health PT already involved. I also provided written Rx for wheelchair to take home, and will order via EMR.

## 2021-06-04 ENCOUNTER — Inpatient Hospital Stay: Payer: No Typology Code available for payment source

## 2021-06-04 ENCOUNTER — Inpatient Hospital Stay (HOSPITAL_BASED_OUTPATIENT_CLINIC_OR_DEPARTMENT_OTHER): Payer: No Typology Code available for payment source | Admitting: Oncology

## 2021-06-04 ENCOUNTER — Telehealth: Payer: Self-pay

## 2021-06-04 ENCOUNTER — Inpatient Hospital Stay: Payer: No Typology Code available for payment source | Attending: Oncology

## 2021-06-04 ENCOUNTER — Other Ambulatory Visit: Payer: Self-pay

## 2021-06-04 ENCOUNTER — Other Ambulatory Visit: Payer: Self-pay | Admitting: *Deleted

## 2021-06-04 VITALS — BP 84/64

## 2021-06-04 VITALS — BP 85/57 | HR 80 | Temp 97.9°F | Resp 16 | Ht 59.0 in | Wt 128.0 lb

## 2021-06-04 DIAGNOSIS — Z6828 Body mass index (BMI) 28.0-28.9, adult: Secondary | ICD-10-CM | POA: Diagnosis not present

## 2021-06-04 DIAGNOSIS — D631 Anemia in chronic kidney disease: Secondary | ICD-10-CM

## 2021-06-04 DIAGNOSIS — M329 Systemic lupus erythematosus, unspecified: Secondary | ICD-10-CM

## 2021-06-04 DIAGNOSIS — C50512 Malignant neoplasm of lower-outer quadrant of left female breast: Secondary | ICD-10-CM | POA: Diagnosis not present

## 2021-06-04 DIAGNOSIS — C50812 Malignant neoplasm of overlapping sites of left female breast: Secondary | ICD-10-CM

## 2021-06-04 DIAGNOSIS — T451X5A Adverse effect of antineoplastic and immunosuppressive drugs, initial encounter: Secondary | ICD-10-CM | POA: Insufficient documentation

## 2021-06-04 DIAGNOSIS — Z833 Family history of diabetes mellitus: Secondary | ICD-10-CM | POA: Insufficient documentation

## 2021-06-04 DIAGNOSIS — M3214 Glomerular disease in systemic lupus erythematosus: Secondary | ICD-10-CM | POA: Insufficient documentation

## 2021-06-04 DIAGNOSIS — I509 Heart failure, unspecified: Secondary | ICD-10-CM | POA: Diagnosis not present

## 2021-06-04 DIAGNOSIS — C7951 Secondary malignant neoplasm of bone: Secondary | ICD-10-CM | POA: Insufficient documentation

## 2021-06-04 DIAGNOSIS — Z79810 Long term (current) use of selective estrogen receptor modulators (SERMs): Secondary | ICD-10-CM | POA: Insufficient documentation

## 2021-06-04 DIAGNOSIS — C771 Secondary and unspecified malignant neoplasm of intrathoracic lymph nodes: Secondary | ICD-10-CM

## 2021-06-04 DIAGNOSIS — I130 Hypertensive heart and chronic kidney disease with heart failure and stage 1 through stage 4 chronic kidney disease, or unspecified chronic kidney disease: Secondary | ICD-10-CM | POA: Insufficient documentation

## 2021-06-04 DIAGNOSIS — L271 Localized skin eruption due to drugs and medicaments taken internally: Secondary | ICD-10-CM | POA: Diagnosis not present

## 2021-06-04 DIAGNOSIS — Z923 Personal history of irradiation: Secondary | ICD-10-CM | POA: Insufficient documentation

## 2021-06-04 DIAGNOSIS — Z9012 Acquired absence of left breast and nipple: Secondary | ICD-10-CM | POA: Diagnosis not present

## 2021-06-04 DIAGNOSIS — I700 Atherosclerosis of aorta: Secondary | ICD-10-CM

## 2021-06-04 DIAGNOSIS — Z170 Estrogen receptor positive status [ER+]: Secondary | ICD-10-CM

## 2021-06-04 DIAGNOSIS — N184 Chronic kidney disease, stage 4 (severe): Secondary | ICD-10-CM

## 2021-06-04 DIAGNOSIS — N179 Acute kidney failure, unspecified: Secondary | ICD-10-CM

## 2021-06-04 DIAGNOSIS — Z9221 Personal history of antineoplastic chemotherapy: Secondary | ICD-10-CM | POA: Diagnosis not present

## 2021-06-04 DIAGNOSIS — C50919 Malignant neoplasm of unspecified site of unspecified female breast: Secondary | ICD-10-CM

## 2021-06-04 DIAGNOSIS — Z803 Family history of malignant neoplasm of breast: Secondary | ICD-10-CM | POA: Insufficient documentation

## 2021-06-04 DIAGNOSIS — Z8249 Family history of ischemic heart disease and other diseases of the circulatory system: Secondary | ICD-10-CM | POA: Diagnosis not present

## 2021-06-04 DIAGNOSIS — Z79899 Other long term (current) drug therapy: Secondary | ICD-10-CM | POA: Insufficient documentation

## 2021-06-04 DIAGNOSIS — D561 Beta thalassemia: Secondary | ICD-10-CM

## 2021-06-04 DIAGNOSIS — N189 Chronic kidney disease, unspecified: Secondary | ICD-10-CM

## 2021-06-04 DIAGNOSIS — C50912 Malignant neoplasm of unspecified site of left female breast: Secondary | ICD-10-CM

## 2021-06-04 DIAGNOSIS — Z95828 Presence of other vascular implants and grafts: Secondary | ICD-10-CM

## 2021-06-04 DIAGNOSIS — D61818 Other pancytopenia: Secondary | ICD-10-CM

## 2021-06-04 LAB — CBC WITH DIFFERENTIAL/PLATELET
Abs Immature Granulocytes: 0.22 10*3/uL — ABNORMAL HIGH (ref 0.00–0.07)
Basophils Absolute: 0 10*3/uL (ref 0.0–0.1)
Basophils Relative: 0 %
Eosinophils Absolute: 0 10*3/uL (ref 0.0–0.5)
Eosinophils Relative: 1 %
HCT: 20.3 % — ABNORMAL LOW (ref 36.0–46.0)
Hemoglobin: 6.6 g/dL — CL (ref 12.0–15.0)
Immature Granulocytes: 5 %
Lymphocytes Relative: 22 %
Lymphs Abs: 1 10*3/uL (ref 0.7–4.0)
MCH: 27.2 pg (ref 26.0–34.0)
MCHC: 32.5 g/dL (ref 30.0–36.0)
MCV: 83.5 fL (ref 80.0–100.0)
Monocytes Absolute: 0.5 10*3/uL (ref 0.1–1.0)
Monocytes Relative: 10 %
Neutro Abs: 3 10*3/uL (ref 1.7–7.7)
Neutrophils Relative %: 62 %
Platelets: 71 10*3/uL — ABNORMAL LOW (ref 150–400)
RBC: 2.43 MIL/uL — ABNORMAL LOW (ref 3.87–5.11)
RDW: 21.5 % — ABNORMAL HIGH (ref 11.5–15.5)
WBC: 4.7 10*3/uL (ref 4.0–10.5)
nRBC: 2.1 % — ABNORMAL HIGH (ref 0.0–0.2)

## 2021-06-04 LAB — COMPREHENSIVE METABOLIC PANEL
ALT: 15 U/L (ref 0–44)
AST: 29 U/L (ref 15–41)
Albumin: 2.9 g/dL — ABNORMAL LOW (ref 3.5–5.0)
Alkaline Phosphatase: 145 U/L — ABNORMAL HIGH (ref 38–126)
Anion gap: 15 (ref 5–15)
BUN: 36 mg/dL — ABNORMAL HIGH (ref 6–20)
CO2: 21 mmol/L — ABNORMAL LOW (ref 22–32)
Calcium: 8.4 mg/dL — ABNORMAL LOW (ref 8.9–10.3)
Chloride: 103 mmol/L (ref 98–111)
Creatinine, Ser: 2.22 mg/dL — ABNORMAL HIGH (ref 0.44–1.00)
GFR, Estimated: 25 mL/min — ABNORMAL LOW (ref >60)
Glucose, Bld: 79 mg/dL (ref 70–99)
Potassium: 3.4 mmol/L — ABNORMAL LOW (ref 3.5–5.1)
Sodium: 139 mmol/L (ref 135–145)
Total Bilirubin: 0.5 mg/dL (ref 0.3–1.2)
Total Protein: 6.4 g/dL — ABNORMAL LOW (ref 6.5–8.1)

## 2021-06-04 LAB — RETICULOCYTES
Immature Retic Fract: 28.5 % — ABNORMAL HIGH (ref 2.3–15.9)
RBC.: 2.45 MIL/uL — ABNORMAL LOW (ref 3.87–5.11)
Retic Count, Absolute: 66.4 10*3/uL (ref 19.0–186.0)
Retic Ct Pct: 2.7 % (ref 0.4–3.1)

## 2021-06-04 LAB — IRON AND TIBC
Iron: 190 ug/dL — ABNORMAL HIGH (ref 41–142)
Saturation Ratios: 101 % — ABNORMAL HIGH (ref 21–57)
TIBC: 189 ug/dL — ABNORMAL LOW (ref 236–444)
UIBC: UNDETERMINED ug/dL (ref 120–384)

## 2021-06-04 LAB — FERRITIN: Ferritin: 5770 ng/mL — ABNORMAL HIGH (ref 11–307)

## 2021-06-04 MED ORDER — SODIUM CHLORIDE 0.9% FLUSH: 10.0000 mL | INTRAVENOUS | Status: DC | PRN

## 2021-06-04 MED ORDER — DARBEPOETIN ALFA 500 MCG/ML IJ SOSY
500.0000 ug | PREFILLED_SYRINGE | Freq: Once | INTRAMUSCULAR | Status: AC
  Administered 2021-06-04: 500 ug via SUBCUTANEOUS
  Filled 2021-06-04: qty 1

## 2021-06-04 MED ORDER — HEPARIN SOD (PORK) LOCK FLUSH 100 UNIT/ML IV SOLN: 500.0000 [IU] | INTRAVENOUS | Status: DC | PRN

## 2021-06-04 NOTE — Patient Instructions (Signed)
Darbepoetin Alfa injection What is this medication? DARBEPOETIN ALFA (dar be POE e tin AL fa) helps your body make more red blood cells. It is used to treat anemia caused by chronic kidney failure and chemotherapy. This medicine may be used for other purposes; ask your health care provider or pharmacist if you have questions. COMMON BRAND NAME(S): Aranesp What should I tell my care team before I take this medication? They need to know if you have any of these conditions: blood clotting disorders or history of blood clots cancer patient not on chemotherapy cystic fibrosis heart disease, such as angina, heart failure, or a history of a heart attack hemoglobin level of 12 g/dL or greater high blood pressure low levels of folate, iron, or vitamin B12 seizures an unusual or allergic reaction to darbepoetin, erythropoietin, albumin, hamster proteins, latex, other medicines, foods, dyes, or preservatives pregnant or trying to get pregnant breast-feeding How should I use this medication? This medicine is for injection into a vein or under the skin. It is usually given by a health care professional in a hospital or clinic setting. If you get this medicine at home, you will be taught how to prepare and give this medicine. Use exactly as directed. Take your medicine at regular intervals. Do not take your medicine more often than directed. It is important that you put your used needles and syringes in a special sharps container. Do not put them in a trash can. If you do not have a sharps container, call your pharmacist or healthcare provider to get one. A special MedGuide will be given to you by the pharmacist with each prescription and refill. Be sure to read this information carefully each time. Talk to your pediatrician regarding the use of this medicine in children. While this medicine may be used in children as young as 1 month of age for selected conditions, precautions do apply. Overdosage: If you  think you have taken too much of this medicine contact a poison control center or emergency room at once. NOTE: This medicine is only for you. Do not share this medicine with others. What if I miss a dose? If you miss a dose, take it as soon as you can. If it is almost time for your next dose, take only that dose. Do not take double or extra doses. What may interact with this medication? Do not take this medicine with any of the following medications: epoetin alfa This list may not describe all possible interactions. Give your health care provider a list of all the medicines, herbs, non-prescription drugs, or dietary supplements you use. Also tell them if you smoke, drink alcohol, or use illegal drugs. Some items may interact with your medicine. What should I watch for while using this medication? Your condition will be monitored carefully while you are receiving this medicine. You may need blood work done while you are taking this medicine. This medicine may cause a decrease in vitamin B6. You should make sure that you get enough vitamin B6 while you are taking this medicine. Discuss the foods you eat and the vitamins you take with your health care professional. What side effects may I notice from receiving this medication? Side effects that you should report to your doctor or health care professional as soon as possible: allergic reactions like skin rash, itching or hives, swelling of the face, lips, or tongue breathing problems changes in vision chest pain confusion, trouble speaking or understanding feeling faint or lightheaded, falls high blood pressure   muscle aches or pains pain, swelling, warmth in the leg rapid weight gain severe headaches sudden numbness or weakness of the face, arm or leg trouble walking, dizziness, loss of balance or coordination seizures (convulsions) swelling of the ankles, feet, hands unusually weak or tired Side effects that usually do not require medical  attention (report to your doctor or health care professional if they continue or are bothersome): diarrhea fever, chills (flu-like symptoms) headaches nausea, vomiting redness, stinging, or swelling at site where injected This list may not describe all possible side effects. Call your doctor for medical advice about side effects. You may report side effects to FDA at 1-800-FDA-1088. Where should I keep my medication? Keep out of the reach of children. Store in a refrigerator between 2 and 8 degrees C (36 and 46 degrees F). Do not freeze. Do not shake. Throw away any unused portion if using a single-dose vial. Throw away any unused medicine after the expiration date. NOTE: This sheet is a summary. It may not cover all possible information. If you have questions about this medicine, talk to your doctor, pharmacist, or health care provider.  2022 Elsevier/Gold Standard (2017-10-20 16:44:20)  

## 2021-06-04 NOTE — Progress Notes (Signed)
Pt said she does have a port, but it hasn't been accessed since 2009 and no blood draws through it.

## 2021-06-04 NOTE — Progress Notes (Signed)
Orders entered for 1 unit of blood to be transfused 06/07/2021
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
## 2021-06-04 NOTE — Telephone Encounter (Signed)
CRITICAL VALUE STICKER  CRITICAL VALUE: Hgb = 6.6  RECEIVER (on-site recipient of call): Yetta Glassman, [place] NOTIFIED: 06/04/21 at 1:53p  MESSENGER (representative from lab): Ulice Dash  MD NOTIFIED: Dr. Ron Agee  TIME OF NOTIFICATION: 06/04/21 at 1:57p  RESPONSE: Notification given to [place], RN for follow-up with provider and pt.

## 2021-06-05 ENCOUNTER — Telehealth: Payer: Self-pay | Admitting: Oncology

## 2021-06-05 LAB — CANCER ANTIGEN 27.29: CA 27.29: 408.8 U/mL — ABNORMAL HIGH (ref 0.0–38.6)

## 2021-06-05 LAB — PREPARE RBC (CROSSMATCH)

## 2021-06-05 NOTE — Telephone Encounter (Signed)
Scheduled appointment per 08/17 sch msg. Patient is aware.

## 2021-06-06 ENCOUNTER — Other Ambulatory Visit: Payer: Self-pay | Admitting: *Deleted

## 2021-06-06 MED ORDER — UNABLE TO FIND: 1 refills | Status: AC

## 2021-06-07 ENCOUNTER — Inpatient Hospital Stay: Payer: No Typology Code available for payment source

## 2021-06-07 ENCOUNTER — Other Ambulatory Visit: Payer: Self-pay

## 2021-06-07 DIAGNOSIS — I130 Hypertensive heart and chronic kidney disease with heart failure and stage 1 through stage 4 chronic kidney disease, or unspecified chronic kidney disease: Secondary | ICD-10-CM | POA: Diagnosis not present

## 2021-06-07 DIAGNOSIS — D631 Anemia in chronic kidney disease: Secondary | ICD-10-CM

## 2021-06-07 MED ORDER — ACETAMINOPHEN 325 MG PO TABS
ORAL_TABLET | ORAL | Status: AC
  Administered 2021-06-07: 650 mg via ORAL
  Filled 2021-06-07: qty 2

## 2021-06-07 MED ORDER — DIPHENHYDRAMINE HCL 25 MG PO CAPS
ORAL_CAPSULE | ORAL | Status: AC
  Filled 2021-06-07: qty 1

## 2021-06-07 MED ORDER — DIPHENHYDRAMINE HCL 25 MG PO CAPS
25.0000 mg | ORAL_CAPSULE | Freq: Once | ORAL | Status: AC
  Administered 2021-06-07: 25 mg via ORAL

## 2021-06-07 MED ORDER — ACETAMINOPHEN 325 MG PO TABS: 650.0000 mg | ORAL_TABLET | Freq: Once | ORAL | Status: AC

## 2021-06-07 MED ORDER — SODIUM CHLORIDE 0.9% IV SOLUTION
250.0000 mL | Freq: Once | INTRAVENOUS | Status: AC
  Administered 2021-06-07: 250 mL via INTRAVENOUS

## 2021-06-07 NOTE — Patient Instructions (Signed)
Blood Transfusion, Adult, Care After This sheet gives you information about how to care for yourself after your procedure. Your doctor may also give you more specific instructions. If youhave problems or questions, contact your doctor. What can I expect after the procedure? After the procedure, it is common to have: Bruising and soreness at the IV site. A fever or chills on the day of the procedure. This may be your body's response to the new blood cells received. A headache. Follow these instructions at home: Insertion site care     Follow instructions from your doctor about how to take care of your insertion site. This is where an IV tube was put into your vein. Make sure you: Wash your hands with soap and water before and after you change your bandage (dressing). If you cannot use soap and water, use hand sanitizer. Change your bandage as told by your doctor. Check your insertion site every day for signs of infection. Check for: Redness, swelling, or pain. Bleeding from the site. Warmth. Pus or a bad smell. General instructions Take over-the-counter and prescription medicines only as told by your doctor. Rest as told by your doctor. Go back to your normal activities as told by your doctor. Keep all follow-up visits as told by your doctor. This is important. Contact a doctor if: You have itching or red, swollen areas of skin (hives). You feel worried or nervous (anxious). You feel weak after doing your normal activities. You have redness, swelling, warmth, or pain around the insertion site. You have blood coming from the insertion site, and the blood does not stop with pressure. You have pus or a bad smell coming from the insertion site. Get help right away if: You have signs of a serious reaction. This may be coming from an allergy or the body's defense system (immune system). Signs include: Trouble breathing or shortness of breath. Swelling of the face or feeling warm  (flushed). Fever or chills. Head, chest, or back pain. Dark pee (urine) or blood in the pee. Widespread rash. Fast heartbeat. Feeling dizzy or light-headed. You may receive your blood transfusion in an outpatient setting. If so, youwill be told whom to contact to report any reactions. These symptoms may be an emergency. Do not wait to see if the symptoms will go away. Get medical help right away. Call your local emergency services (911 in the [place] Do not drive yourself to the hospital. Summary Bruising and soreness at the IV site are common. Check your insertion site every day for signs of infection. Rest as told by your doctor. Go back to your normal activities as told by your doctor. Get help right away if you have signs of a serious reaction. This information is not intended to replace advice given to you by your health care provider. Make sure you discuss any questions you have with your healthcare provider. Document Revised: 03/30/2019 Document Reviewed: 03/30/2019 Elsevier Patient Education  2022 Elsevier Inc.  

## 2021-06-09 LAB — TYPE AND SCREEN
ABO/RH(D): B POS
Antibody Screen: POSITIVE
DAT, IgG: NEGATIVE
Unit division: 0

## 2021-06-09 LAB — BPAM RBC
Blood Product Expiration Date: 202209152359
ISSUE DATE / TIME: 202208201001
Unit Type and Rh: 7300

## 2021-06-20 ENCOUNTER — Telehealth: Payer: Self-pay | Admitting: Family Medicine

## 2021-06-20 NOTE — Telephone Encounter (Signed)
Agree with restarting PT

## 2021-06-20 NOTE — Telephone Encounter (Signed)
Home Health verbal orders Caller Name: [place] Name: advance home   Callback number: [phone]  Requesting OT/PT/Skilled nursing/Social Work/Speech:  PT  Reason: had covid [place]  orders ended and she needs it  Frequency: 2w2 1w2  Please forward to [facility] pool or providers CMA

## 2021-06-20 NOTE — Telephone Encounter (Signed)
Lvm asking Cindy to call back.  Need to inform her Dr. Darnell Level is giving verbal orders for services requested for pt.

## 2021-06-24 NOTE — Telephone Encounter (Signed)
Spoke with [place] her Dr. Darnell Level is giving verbal orders for services requested.

## 2021-06-25 ENCOUNTER — Inpatient Hospital Stay: Payer: No Typology Code available for payment source

## 2021-06-25 ENCOUNTER — Other Ambulatory Visit: Payer: Self-pay | Admitting: *Deleted

## 2021-06-25 ENCOUNTER — Other Ambulatory Visit: Payer: Self-pay

## 2021-06-25 ENCOUNTER — Ambulatory Visit: Payer: No Typology Code available for payment source

## 2021-06-25 ENCOUNTER — Inpatient Hospital Stay: Payer: No Typology Code available for payment source | Attending: Oncology | Admitting: Pharmacist

## 2021-06-25 ENCOUNTER — Other Ambulatory Visit: Payer: No Typology Code available for payment source

## 2021-06-25 VITALS — BP 132/78 | HR 87 | Temp 97.9°F | Resp 18 | Ht 59.0 in | Wt 125.2 lb

## 2021-06-25 DIAGNOSIS — M3214 Glomerular disease in systemic lupus erythematosus: Secondary | ICD-10-CM

## 2021-06-25 DIAGNOSIS — C7951 Secondary malignant neoplasm of bone: Secondary | ICD-10-CM | POA: Insufficient documentation

## 2021-06-25 DIAGNOSIS — C50919 Malignant neoplasm of unspecified site of unspecified female breast: Secondary | ICD-10-CM

## 2021-06-25 DIAGNOSIS — R0602 Shortness of breath: Secondary | ICD-10-CM | POA: Insufficient documentation

## 2021-06-25 DIAGNOSIS — I130 Hypertensive heart and chronic kidney disease with heart failure and stage 1 through stage 4 chronic kidney disease, or unspecified chronic kidney disease: Secondary | ICD-10-CM | POA: Diagnosis present

## 2021-06-25 DIAGNOSIS — Z79899 Other long term (current) drug therapy: Secondary | ICD-10-CM | POA: Insufficient documentation

## 2021-06-25 DIAGNOSIS — M549 Dorsalgia, unspecified: Secondary | ICD-10-CM | POA: Diagnosis not present

## 2021-06-25 DIAGNOSIS — N184 Chronic kidney disease, stage 4 (severe): Secondary | ICD-10-CM | POA: Diagnosis present

## 2021-06-25 DIAGNOSIS — Z79810 Long term (current) use of selective estrogen receptor modulators (SERMs): Secondary | ICD-10-CM | POA: Diagnosis not present

## 2021-06-25 DIAGNOSIS — Z9221 Personal history of antineoplastic chemotherapy: Secondary | ICD-10-CM | POA: Insufficient documentation

## 2021-06-25 DIAGNOSIS — Z68.41 Body mass index (BMI) pediatric, 5th percentile to less than 85th percentile for age: Secondary | ICD-10-CM | POA: Insufficient documentation

## 2021-06-25 DIAGNOSIS — Z8249 Family history of ischemic heart disease and other diseases of the circulatory system: Secondary | ICD-10-CM | POA: Diagnosis not present

## 2021-06-25 DIAGNOSIS — I700 Atherosclerosis of aorta: Secondary | ICD-10-CM

## 2021-06-25 DIAGNOSIS — D649 Anemia, unspecified: Secondary | ICD-10-CM

## 2021-06-25 DIAGNOSIS — C50012 Malignant neoplasm of nipple and areola, left female breast: Secondary | ICD-10-CM

## 2021-06-25 DIAGNOSIS — Z803 Family history of malignant neoplasm of breast: Secondary | ICD-10-CM | POA: Insufficient documentation

## 2021-06-25 DIAGNOSIS — Z923 Personal history of irradiation: Secondary | ICD-10-CM | POA: Diagnosis not present

## 2021-06-25 DIAGNOSIS — Z833 Family history of diabetes mellitus: Secondary | ICD-10-CM | POA: Insufficient documentation

## 2021-06-25 DIAGNOSIS — D631 Anemia in chronic kidney disease: Secondary | ICD-10-CM

## 2021-06-25 DIAGNOSIS — I509 Heart failure, unspecified: Secondary | ICD-10-CM | POA: Insufficient documentation

## 2021-06-25 DIAGNOSIS — C771 Secondary and unspecified malignant neoplasm of intrathoracic lymph nodes: Secondary | ICD-10-CM

## 2021-06-25 DIAGNOSIS — Z170 Estrogen receptor positive status [ER+]: Secondary | ICD-10-CM | POA: Diagnosis not present

## 2021-06-25 DIAGNOSIS — M329 Systemic lupus erythematosus, unspecified: Secondary | ICD-10-CM

## 2021-06-25 DIAGNOSIS — D61818 Other pancytopenia: Secondary | ICD-10-CM

## 2021-06-25 DIAGNOSIS — C50512 Malignant neoplasm of lower-outer quadrant of left female breast: Secondary | ICD-10-CM | POA: Insufficient documentation

## 2021-06-25 DIAGNOSIS — D561 Beta thalassemia: Secondary | ICD-10-CM

## 2021-06-25 DIAGNOSIS — Z9012 Acquired absence of left breast and nipple: Secondary | ICD-10-CM | POA: Insufficient documentation

## 2021-06-25 DIAGNOSIS — IMO0002 Reserved for concepts with insufficient information to code with codable children: Secondary | ICD-10-CM

## 2021-06-25 LAB — CBC WITH DIFFERENTIAL/PLATELET
Abs Immature Granulocytes: 0.03 10*3/uL (ref 0.00–0.07)
Basophils Absolute: 0 10*3/uL (ref 0.0–0.1)
Basophils Relative: 0 %
Eosinophils Absolute: 0 10*3/uL (ref 0.0–0.5)
Eosinophils Relative: 1 %
HCT: 19.5 % — ABNORMAL LOW (ref 36.0–46.0)
Hemoglobin: 6.4 g/dL — CL (ref 12.0–15.0)
Immature Granulocytes: 1 %
Lymphocytes Relative: 29 %
Lymphs Abs: 0.8 10*3/uL (ref 0.7–4.0)
MCH: 27.5 pg (ref 26.0–34.0)
MCHC: 32.8 g/dL (ref 30.0–36.0)
MCV: 83.7 fL (ref 80.0–100.0)
Monocytes Absolute: 0.1 10*3/uL (ref 0.1–1.0)
Monocytes Relative: 4 %
Neutro Abs: 1.8 10*3/uL (ref 1.7–7.7)
Neutrophils Relative %: 65 %
Platelets: 44 10*3/uL — ABNORMAL LOW (ref 150–400)
RBC: 2.33 MIL/uL — ABNORMAL LOW (ref 3.87–5.11)
RDW: 18.6 % — ABNORMAL HIGH (ref 11.5–15.5)
WBC: 2.8 10*3/uL — ABNORMAL LOW (ref 4.0–10.5)
nRBC: 1.1 % — ABNORMAL HIGH (ref 0.0–0.2)

## 2021-06-25 LAB — COMPREHENSIVE METABOLIC PANEL
ALT: 10 U/L (ref 0–44)
AST: 34 U/L (ref 15–41)
Albumin: 3.3 g/dL — ABNORMAL LOW (ref 3.5–5.0)
Alkaline Phosphatase: 175 U/L — ABNORMAL HIGH (ref 38–126)
Anion gap: 15 (ref 5–15)
BUN: 54 mg/dL — ABNORMAL HIGH (ref 6–20)
CO2: 20 mmol/L — ABNORMAL LOW (ref 22–32)
Calcium: 9.3 mg/dL (ref 8.9–10.3)
Chloride: 102 mmol/L (ref 98–111)
Creatinine, Ser: 2.37 mg/dL — ABNORMAL HIGH (ref 0.44–1.00)
GFR, Estimated: 23 mL/min — ABNORMAL LOW (ref >60)
Glucose, Bld: 91 mg/dL (ref 70–99)
Potassium: 3.7 mmol/L (ref 3.5–5.1)
Sodium: 137 mmol/L (ref 135–145)
Total Bilirubin: 0.7 mg/dL (ref 0.3–1.2)
Total Protein: 7.3 g/dL (ref 6.5–8.1)

## 2021-06-25 LAB — RETICULOCYTES
Immature Retic Fract: 16.4 % — ABNORMAL HIGH (ref 2.3–15.9)
RBC.: 2.22 MIL/uL — ABNORMAL LOW (ref 3.87–5.11)
Retic Count, Absolute: 16.9 10*3/uL — ABNORMAL LOW (ref 19.0–186.0)
Retic Ct Pct: 0.8 % (ref 0.4–3.1)

## 2021-06-25 LAB — IRON AND TIBC
Iron: 248 ug/dL — ABNORMAL HIGH (ref 41–142)
Saturation Ratios: 106 % — ABNORMAL HIGH (ref 21–57)
TIBC: 235 ug/dL — ABNORMAL LOW (ref 236–444)
UIBC: UNDETERMINED ug/dL (ref 120–384)

## 2021-06-25 LAB — FERRITIN: Ferritin: 4704 ng/mL — ABNORMAL HIGH (ref 11–307)

## 2021-06-25 MED ORDER — DARBEPOETIN ALFA 500 MCG/ML IJ SOSY
500.0000 ug | PREFILLED_SYRINGE | Freq: Once | INTRAMUSCULAR | Status: AC
  Administered 2021-06-25: 500 ug via SUBCUTANEOUS
  Filled 2021-06-25: qty 1

## 2021-06-25 NOTE — Patient Instructions (Signed)
Darbepoetin Alfa injection What is this medication? DARBEPOETIN ALFA (dar be POE e tin AL fa) helps your body make more red blood cells. It is used to treat anemia caused by chronic kidney failure and chemotherapy. This medicine may be used for other purposes; ask your health care provider or pharmacist if you have questions. COMMON BRAND NAME(S): Aranesp What should I tell my care team before I take this medication? They need to know if you have any of these conditions: blood clotting disorders or history of blood clots cancer patient not on chemotherapy cystic fibrosis heart disease, such as angina, heart failure, or a history of a heart attack hemoglobin level of 12 g/dL or greater high blood pressure low levels of folate, iron, or vitamin B12 seizures an unusual or allergic reaction to darbepoetin, erythropoietin, albumin, hamster proteins, latex, other medicines, foods, dyes, or preservatives pregnant or trying to get pregnant breast-feeding How should I use this medication? This medicine is for injection into a vein or under the skin. It is usually given by a health care professional in a hospital or clinic setting. If you get this medicine at home, you will be taught how to prepare and give this medicine. Use exactly as directed. Take your medicine at regular intervals. Do not take your medicine more often than directed. It is important that you put your used needles and syringes in a special sharps container. Do not put them in a trash can. If you do not have a sharps container, call your pharmacist or healthcare provider to get one. A special MedGuide will be given to you by the pharmacist with each prescription and refill. Be sure to read this information carefully each time. Talk to your pediatrician regarding the use of this medicine in children. While this medicine may be used in children as young as 1 month of age for selected conditions, precautions do apply. Overdosage: If you  think you have taken too much of this medicine contact a poison control center or emergency room at once. NOTE: This medicine is only for you. Do not share this medicine with others. What if I miss a dose? If you miss a dose, take it as soon as you can. If it is almost time for your next dose, take only that dose. Do not take double or extra doses. What may interact with this medication? Do not take this medicine with any of the following medications: epoetin alfa This list may not describe all possible interactions. Give your health care provider a list of all the medicines, herbs, non-prescription drugs, or dietary supplements you use. Also tell them if you smoke, drink alcohol, or use illegal drugs. Some items may interact with your medicine. What should I watch for while using this medication? Your condition will be monitored carefully while you are receiving this medicine. You may need blood work done while you are taking this medicine. This medicine may cause a decrease in vitamin B6. You should make sure that you get enough vitamin B6 while you are taking this medicine. Discuss the foods you eat and the vitamins you take with your health care professional. What side effects may I notice from receiving this medication? Side effects that you should report to your doctor or health care professional as soon as possible: allergic reactions like skin rash, itching or hives, swelling of the face, lips, or tongue breathing problems changes in vision chest pain confusion, trouble speaking or understanding feeling faint or lightheaded, falls high blood pressure   muscle aches or pains pain, swelling, warmth in the leg rapid weight gain severe headaches sudden numbness or weakness of the face, arm or leg trouble walking, dizziness, loss of balance or coordination seizures (convulsions) swelling of the ankles, feet, hands unusually weak or tired Side effects that usually do not require medical  attention (report to your doctor or health care professional if they continue or are bothersome): diarrhea fever, chills (flu-like symptoms) headaches nausea, vomiting redness, stinging, or swelling at site where injected This list may not describe all possible side effects. Call your doctor for medical advice about side effects. You may report side effects to FDA at 1-800-FDA-1088. Where should I keep my medication? Keep out of the reach of children. Store in a refrigerator between 2 and 8 degrees C (36 and 46 degrees F). Do not freeze. Do not shake. Throw away any unused portion if using a single-dose vial. Throw away any unused medicine after the expiration date. NOTE: This sheet is a summary. It may not cover all possible information. If you have questions about this medicine, talk to your doctor, pharmacist, or health care provider.  2022 Elsevier/Gold Standard (2017-10-20 16:44:20)  

## 2021-06-25 NOTE — Progress Notes (Signed)
[place] at [facility] [phone] [phone]   Oncology Clinical Pharmacist Practitioner Initial Assessment  Melanie Holloway is a 55 y.o. female with a diagnosis of metastatic breast cancer.  Indication/Regimen Capecitabine (Xeloda) is being used appropriately for treatment of metastatic breast cancer by Dr. Lurline Del. Dr. Jana Hakim is aware of Melanie Holloway underlying kidney function and is okay continuing therapy.   Wt Readings from Last 1 Encounters:  06/25/21 125 lb 3.2 oz (56.8 kg)    Estimated body surface area is 1.54 meters squared as calculated from the following:   Height as of this encounter: 4\' 11"  (1.499 m).   Weight as of this encounter: 125 lb 3.2 oz (56.8 kg).  The dosing regimen is capecitabine 1000 mg by mouth every 12 hours on days 1 to 14 of a 21-day cycle. It is planned to continue until unacceptable toxicity or progression. Dr. Jana Hakim last saw Melanie Holloway on 06/04/21. Patient has been on capecitabine since February 2021. Clinical pharmacy was asked to see Melanie Holloway and discuss her capecitabine and other medications since patient  also has CKD and needs frequent blood transfusions.  Melanie Holloway also receives darbopotetin every 2 weeks and was receiving Xgeva. Delton See has been held recently due to hypocalcemia.  Patient does confirm that she is now taking a calcium supplement that her nephrologist prescribed but patient is unsure of calcium type or dose.  She said she would tell Dr. Jana Hakim and clinical team the next time she is in to see Dr. Jana Hakim so this can be added to her medication list. Melanie Holloway is due for darbepoetin today. Patient is also followed by her nephrologist Dr. Juleen China who was contacted prior to patient visit and was in agreement about calcium supplementation.  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 Jana Hakim  prescribed last time.  She reports no other side effects from capecitabine at this time. Unfortunately today her Hgb has decreased again and so Dr. Jana Hakim would like to arrange for a blood transfusion which will likely be given in a day or so.  His nurse is setting this up and the patient will be contacted with a date and time.  She also continues to have iron overload likely due to the blood transfusions.  Dr. Jana Hakim had written for deferiprone in the past and sent this to [facility].  Patient states that the medication was too expensive and financial assistance programs that were provided to patient initially were unable to assist her.  Clinical pharmacy contacted [facility] today and they have sent their prescription over to Dr. Jana Hakim.  This will be filled out and sent since patient does have a Beta Thalassemia indication in her problem list that may qualify her for patient assistance.    Dose Modifications None at this time. Dr. Jana Hakim aware of renal function parameters for capecitabine  Allergies No Known Allergies  Laboratory Data CBC EXTENDED Latest Ref Rng & Units 06/25/2021 06/25/2021 06/04/2021  WBC 4.0 - 10.5 K/uL 2.8(L) - 4.7  RBC 3.87 - 5.11 MIL/uL 2.33(L) 2.22(L) 2.43(L)  HGB 12.0 - 15.0 g/dL 6.4(LL) - 6.6(LL)  HCT 36.0 - 46.0 % 19.5(L) - 20.3(L)  PLT 150 - 400 K/uL 44(L) - 71(L)  NEUTROABS 1.7 - 7.7 K/uL 1.8 - 3.0  LYMPHSABS 0.7 - 4.0 K/uL 0.8 - 1.0    CMP Latest Ref Rng &  Units 06/25/2021 06/04/2021 05/23/2021  Glucose 70 - 99 mg/dL 91 79 100(H)  BUN 6 - 20 mg/dL 54(H) 36(H) 32(H)  Creatinine 0.44 - 1.00 mg/dL 2.37(H) 2.22(H) 2.04(H)  Sodium 135 - 145 mmol/L 137 139 140  Potassium 3.5 - 5.1 mmol/L 3.7 3.4(L) 3.4(L)  Chloride 98 - 111 mmol/L 102 103 114(H)  CO2 22 - 32 mmol/L 20(L) 21(L) 19(L)  Calcium 8.9 - 10.3 mg/dL 9.3 8.4(L) 8.9  Total Protein 6.5 - 8.1 g/dL 7.3 6.4(L) -  Total Bilirubin 0.3 - 1.2 mg/dL 0.7 0.5 -  Alkaline Phos 38 - 126 U/L 175(H)  145(H) -  AST 15 - 41 U/L 34 29 -  ALT 0 - 44 U/L 10 15 -   Lab Results  Component Value Date   IRON 248 (H) 06/25/2021   TIBC 235 (L) 06/25/2021   FERRITIN 5,770 (H) 06/04/2021     Contraindications Contraindications were reviewed? Yes Contraindications to therapy were identified?  Dr. Jana Hakim aware of capecitabine renal dosing and okay to continue at this time  Safety Precautions The following safety precautions for the use of capecitabine were reviewed:  Diarrhea Cardiotoxicity Dehydration and renal failure Nausea/Vomiting HFS Stomatitis Fever Neutropenia  Medication Reconciliation Current Outpatient Medications  Medication Sig Dispense Refill   acetaminophen (TYLENOL) 325 MG tablet Take 2 tablets (650 mg total) by mouth every 4 (four) hours as needed for headache or mild pain. 30 tablet 0   capecitabine (XELODA) 500 MG tablet TAKE 2 TABLETS BY MOUTH TWICE DAILY AFTER A MEAL FOR 14 DAYS, THEN DO NOT TAKE FOR 7 DAYS. REPEAT. 56 tablet 5   clobetasol cream (TEMOVATE) 1.93 % Apply 1 application topically 2 (two) times daily.     hydroxychloroquine (PLAQUENIL) 200 MG tablet Take 1 tablet (200 mg total) by mouth daily.     loperamide (IMODIUM) 2 MG capsule Take 1 capsule (2 mg total) by mouth as needed for diarrhea or loose stools. Take 1 capsule after the first diarrhea event, and repeat with each additional diarrhea, maximum 6 tablets per day 60 capsule 0   metoprolol succinate (TOPROL-XL) 25 MG 24 hr tablet Take 1 tablet (25 mg total) by mouth daily. Take with or immediately following a meal.     mycophenolate (CELLCEPT) 250 MG capsule Take 2 capsules (500 mg total) by mouth 2 (two) times daily. 60 capsule 0   ondansetron (ZOFRAN) 8 MG tablet Take 1 tablet (8 mg total) by mouth 2 (two) times daily as needed for nausea or vomiting. 40 tablet 0   potassium chloride (K-DUR) 10 MEQ tablet Take 10 mEq by mouth every evening.      torsemide (DEMADEX) 20 MG tablet Take 2 tablets (40  mg total) by mouth 2 (two) times daily. 60 tablet 0   UNABLE TO FIND Med Name:Sildenafil 2% cream use bid prn to hand and feet per CIPN 30 g 1   cholestyramine (QUESTRAN) 4 g packet Take 1 packet (4 g total) by mouth daily as needed (diarrhea). (Patient not taking: Reported on 06/25/2021)     Deferiprone (FERRIPROX) 1000 MG TABS Take 1 tablet by mouth in the morning and at bedtime. (Patient not taking: Reported on 06/25/2021) 180 tablet 1   Current Facility-Administered Medications  Medication Dose Route Frequency Provider Last Rate Last Admin   0.9 %  sodium chloride infusion   Intravenous PRN Causey, Charlestine Massed, NP       Facility-Administered Medications Ordered in Other Visits  Medication Dose Route Frequency Provider Last  Rate Last Admin   Darbepoetin Alfa (ARANESP) injection 500 mcg  500 mcg Subcutaneous Once Magrinat, Virgie Dad, MD        Medication reconciliation is based on the patient's most recent medication list in the electronic medical record (EMR) including herbal products and OTC medications.   The patient's medication list was reviewed today with the patient? Yes   Drug-drug interactions (DDIs) DDIs were evaluated? Yes DDIs identified? No   Drug-Food Interactions Drug-food interactions were evaluated? Yes Drug-food interactions identified? No   Follow-up Plan  Darbepoetin today and again in 2 weeks Blood transfusion to be scheduled by nursing and patient will be called with infusion time Deferiprone prescription and financial assistance forms in process, [facility] will contact patient with next steps Labs / visit with Dr. Jana Hakim / darbepoetin injection on 07/09/21 (labs, visit, injection will be cancelled for 07/03/21) Clinical pharmacy will continue to support Melanie Holloway and Dr. Jana Hakim as needed  Melanie Holloway participated in the discussion, expressed understanding, and voiced agreement with the above plan. All questions were answered to her satisfaction.  The patient was advised to contact the clinic at [phone] with any questions or concerns prior to her return visit.   I spent 20 minutes assessing the patient.  Raina Mina, RPH-CPP, 06/25/2021 2:31 PM

## 2021-06-26 ENCOUNTER — Encounter: Payer: Self-pay | Admitting: Oncology

## 2021-06-26 ENCOUNTER — Inpatient Hospital Stay: Payer: No Typology Code available for payment source

## 2021-06-26 DIAGNOSIS — D649 Anemia, unspecified: Secondary | ICD-10-CM

## 2021-06-26 DIAGNOSIS — I130 Hypertensive heart and chronic kidney disease with heart failure and stage 1 through stage 4 chronic kidney disease, or unspecified chronic kidney disease: Secondary | ICD-10-CM | POA: Diagnosis not present

## 2021-06-26 LAB — CANCER ANTIGEN 27.29: CA 27.29: 441.9 U/mL — ABNORMAL HIGH (ref 0.0–38.6)

## 2021-06-26 LAB — PREPARE RBC (CROSSMATCH)

## 2021-06-26 MED ORDER — SODIUM CHLORIDE 0.9% IV SOLUTION
250.0000 mL | Freq: Once | INTRAVENOUS | Status: AC
  Administered 2021-06-26: 250 mL via INTRAVENOUS

## 2021-06-26 MED ORDER — ACETAMINOPHEN 325 MG PO TABS
650.0000 mg | ORAL_TABLET | Freq: Once | ORAL | Status: AC
  Administered 2021-06-26: 650 mg via ORAL
  Filled 2021-06-26: qty 2

## 2021-06-26 MED ORDER — DIPHENHYDRAMINE HCL 25 MG PO CAPS
25.0000 mg | ORAL_CAPSULE | Freq: Once | ORAL | Status: AC
  Administered 2021-06-26: 25 mg via ORAL
  Filled 2021-06-26: qty 1

## 2021-06-26 NOTE — Patient Instructions (Signed)
Blood Transfusion, Adult, Care After This sheet gives you information about how to care for yourself after your procedure. Your doctor may also give you more specific instructions. If you have problems or questions, contact your doctor. What can I expect after the procedure? After the procedure, it is common to have: Bruising and soreness at the IV site. A headache. Follow these instructions at home: Insertion site care   Follow instructions from your doctor about how to take care of your insertion site. This is where an IV tube was put into your vein. Make sure you: Wash your hands with soap and water before and after you change your bandage (dressing). If you cannot use soap and water, use hand sanitizer. Change your bandage as told by your doctor. Check your insertion site every day for signs of infection. Check for: Redness, swelling, or pain. Bleeding from the site. Warmth. Pus or a bad smell. General instructions Take over-the-counter and prescription medicines only as told by your doctor. Rest as told by your doctor. Go back to your normal activities as told by your doctor. Keep all follow-up visits as told by your doctor. This is important. Contact a doctor if: You have itching or red, swollen areas of skin (hives). You feel worried or nervous (anxious). You feel weak after doing your normal activities. You have redness, swelling, warmth, or pain around the insertion site. You have blood coming from the insertion site, and the blood does not stop with pressure. You have pus or a bad smell coming from the insertion site. Get help right away if: You have signs of a serious reaction. This may be coming from an allergy or the body's defense system (immune system). Signs include: Trouble breathing or shortness of breath. Swelling of the face or feeling warm (flushed). Fever or chills. Head, chest, or back pain. Dark pee (urine) or blood in the pee. Widespread rash. Fast  heartbeat. Feeling dizzy or light-headed. You may receive your blood transfusion in an outpatient setting. If so, you will be told whom to contact to report any reactions. These symptoms may be an emergency. Do not wait to see if the symptoms will go away. Get medical help right away. Call your local emergency services (911 in the [place] Do not drive yourself to the hospital. Summary Bruising and soreness at the IV site are common. Check your insertion site every day for signs of infection. Rest as told by your doctor. Go back to your normal activities as told by your doctor. Get help right away if you have signs of a serious reaction. This information is not intended to replace advice given to you by your health care provider. Make sure you discuss any questions you have with your health care provider. Document Revised: 01/30/2021 Document Reviewed: 03/30/2019 Elsevier Patient Education  2022 [employer] Inc.  

## 2021-06-27 LAB — TYPE AND SCREEN
ABO/RH(D): B POS
Antibody Screen: POSITIVE
DAT, IgG: NEGATIVE
Unit division: 0

## 2021-06-27 LAB — BPAM RBC
Blood Product Expiration Date: 202210042359
ISSUE DATE / TIME: 202209080932
Unit Type and Rh: 7300

## 2021-07-01 ENCOUNTER — Telehealth: Payer: Self-pay | Admitting: Family Medicine

## 2021-07-01 ENCOUNTER — Other Ambulatory Visit: Payer: Self-pay

## 2021-07-01 ENCOUNTER — Telehealth: Payer: Self-pay | Admitting: *Deleted

## 2021-07-01 ENCOUNTER — Ambulatory Visit
Admission: EM | Admit: 2021-07-01 | Discharge: 2021-07-01 | Disposition: A | Discharge status: Home / Self Care | Payer: No Typology Code available for payment source | Attending: Family Medicine | Admitting: Family Medicine

## 2021-07-01 ENCOUNTER — Inpatient Hospital Stay
Admission: EM | Admit: 2021-07-01 | Discharge: 2021-07-03 | DRG: 808 | Disposition: A | Discharge status: Home / Self Care | Payer: No Typology Code available for payment source | Attending: Obstetrics and Gynecology | Admitting: Obstetrics and Gynecology

## 2021-07-01 ENCOUNTER — Encounter: Payer: Self-pay | Admitting: Emergency Medicine

## 2021-07-01 ENCOUNTER — Emergency Department: Payer: No Typology Code available for payment source

## 2021-07-01 DIAGNOSIS — D61818 Other pancytopenia: Secondary | ICD-10-CM | POA: Diagnosis not present

## 2021-07-01 DIAGNOSIS — C50919 Malignant neoplasm of unspecified site of unspecified female breast: Secondary | ICD-10-CM | POA: Diagnosis present

## 2021-07-01 DIAGNOSIS — J9601 Acute respiratory failure with hypoxia: Secondary | ICD-10-CM | POA: Diagnosis present

## 2021-07-01 DIAGNOSIS — N184 Chronic kidney disease, stage 4 (severe): Secondary | ICD-10-CM | POA: Diagnosis present

## 2021-07-01 DIAGNOSIS — Z923 Personal history of irradiation: Secondary | ICD-10-CM

## 2021-07-01 DIAGNOSIS — R0602 Shortness of breath: Secondary | ICD-10-CM

## 2021-07-01 DIAGNOSIS — D649 Anemia, unspecified: Secondary | ICD-10-CM | POA: Diagnosis not present

## 2021-07-01 DIAGNOSIS — Z20822 Contact with and (suspected) exposure to covid-19: Secondary | ICD-10-CM | POA: Diagnosis present

## 2021-07-01 DIAGNOSIS — D569 Thalassemia, unspecified: Secondary | ICD-10-CM | POA: Diagnosis present

## 2021-07-01 DIAGNOSIS — D631 Anemia in chronic kidney disease: Secondary | ICD-10-CM | POA: Diagnosis present

## 2021-07-01 DIAGNOSIS — I10 Essential (primary) hypertension: Secondary | ICD-10-CM | POA: Diagnosis present

## 2021-07-01 DIAGNOSIS — C7951 Secondary malignant neoplasm of bone: Secondary | ICD-10-CM | POA: Diagnosis not present

## 2021-07-01 DIAGNOSIS — Z803 Family history of malignant neoplasm of breast: Secondary | ICD-10-CM

## 2021-07-01 DIAGNOSIS — R0609 Other forms of dyspnea: Secondary | ICD-10-CM

## 2021-07-01 DIAGNOSIS — I5032 Chronic diastolic (congestive) heart failure: Secondary | ICD-10-CM | POA: Diagnosis not present

## 2021-07-01 DIAGNOSIS — Z9010 Acquired absence of unspecified breast and nipple: Secondary | ICD-10-CM

## 2021-07-01 DIAGNOSIS — Z8249 Family history of ischemic heart disease and other diseases of the circulatory system: Secondary | ICD-10-CM

## 2021-07-01 DIAGNOSIS — L930 Discoid lupus erythematosus: Secondary | ICD-10-CM | POA: Diagnosis present

## 2021-07-01 DIAGNOSIS — M3214 Glomerular disease in systemic lupus erythematosus: Secondary | ICD-10-CM | POA: Diagnosis present

## 2021-07-01 DIAGNOSIS — Z79899 Other long term (current) drug therapy: Secondary | ICD-10-CM

## 2021-07-01 DIAGNOSIS — I130 Hypertensive heart and chronic kidney disease with heart failure and stage 1 through stage 4 chronic kidney disease, or unspecified chronic kidney disease: Secondary | ICD-10-CM | POA: Diagnosis present

## 2021-07-01 DIAGNOSIS — C50912 Malignant neoplasm of unspecified site of left female breast: Secondary | ICD-10-CM | POA: Diagnosis present

## 2021-07-01 DIAGNOSIS — Z9221 Personal history of antineoplastic chemotherapy: Secondary | ICD-10-CM

## 2021-07-01 DIAGNOSIS — R0600 Dyspnea, unspecified: Secondary | ICD-10-CM

## 2021-07-01 DIAGNOSIS — E559 Vitamin D deficiency, unspecified: Secondary | ICD-10-CM | POA: Diagnosis present

## 2021-07-01 DIAGNOSIS — Z833 Family history of diabetes mellitus: Secondary | ICD-10-CM

## 2021-07-01 DIAGNOSIS — Z170 Estrogen receptor positive status [ER+]: Secondary | ICD-10-CM

## 2021-07-01 LAB — BASIC METABOLIC PANEL
Anion gap: 13 (ref 5–15)
BUN: 61 mg/dL — ABNORMAL HIGH (ref 6–20)
CO2: 19 mmol/L — ABNORMAL LOW (ref 22–32)
Calcium: 9.2 mg/dL (ref 8.9–10.3)
Chloride: 104 mmol/L (ref 98–111)
Creatinine, Ser: 2.5 mg/dL — ABNORMAL HIGH (ref 0.44–1.00)
GFR, Estimated: 22 mL/min — ABNORMAL LOW (ref >60)
Glucose, Bld: 90 mg/dL (ref 70–99)
Potassium: 3.5 mmol/L (ref 3.5–5.1)
Sodium: 136 mmol/L (ref 135–145)

## 2021-07-01 LAB — CBC WITH DIFFERENTIAL/PLATELET
Abs Immature Granulocytes: 0.03 10*3/uL (ref 0.00–0.07)
Basophils Absolute: 0 10*3/uL (ref 0.0–0.1)
Basophils Relative: 0 %
Eosinophils Absolute: 0 10*3/uL (ref 0.0–0.5)
Eosinophils Relative: 1 %
HCT: 19.2 % — ABNORMAL LOW (ref 36.0–46.0)
Hemoglobin: 6.6 g/dL — ABNORMAL LOW (ref 12.0–15.0)
Immature Granulocytes: 1 %
Lymphocytes Relative: 34 %
Lymphs Abs: 0.7 10*3/uL (ref 0.7–4.0)
MCH: 27.4 pg (ref 26.0–34.0)
MCHC: 34.4 g/dL (ref 30.0–36.0)
MCV: 79.7 fL — ABNORMAL LOW (ref 80.0–100.0)
Monocytes Absolute: 0.2 10*3/uL (ref 0.1–1.0)
Monocytes Relative: 7 %
Neutro Abs: 1.2 10*3/uL — ABNORMAL LOW (ref 1.7–7.7)
Neutrophils Relative %: 57 %
Platelets: 31 10*3/uL — ABNORMAL LOW (ref 150–400)
RBC: 2.41 MIL/uL — ABNORMAL LOW (ref 3.87–5.11)
RDW: 19.4 % — ABNORMAL HIGH (ref 11.5–15.5)
Smear Review: DECREASED
WBC: 2.1 10*3/uL — ABNORMAL LOW (ref 4.0–10.5)
nRBC: 0.9 % — ABNORMAL HIGH (ref 0.0–0.2)

## 2021-07-01 LAB — FERRITIN: Ferritin: 4929 ng/mL — ABNORMAL HIGH (ref 11–307)

## 2021-07-01 LAB — IRON AND TIBC
Iron: 215 ug/dL — ABNORMAL HIGH (ref 28–170)
Saturation Ratios: 90 % — ABNORMAL HIGH (ref 10.4–31.8)
TIBC: 238 ug/dL — ABNORMAL LOW (ref 250–450)
UIBC: 23 ug/dL

## 2021-07-01 LAB — RESP PANEL BY RT-PCR (FLU A&B, COVID) ARPGX2
Influenza A by PCR: NEGATIVE
Influenza B by PCR: NEGATIVE
SARS Coronavirus 2 by RT PCR: NEGATIVE

## 2021-07-01 LAB — PREPARE RBC (CROSSMATCH)

## 2021-07-01 LAB — APTT: aPTT: 33 seconds (ref 24–36)

## 2021-07-01 LAB — BRAIN NATRIURETIC PEPTIDE: B Natriuretic Peptide: 272.6 pg/mL — ABNORMAL HIGH (ref 0.0–100.0)

## 2021-07-01 LAB — FOLATE: Folate: 4.5 ng/mL — ABNORMAL LOW (ref >5.9)

## 2021-07-01 LAB — PROTIME-INR
INR: 1.3 — ABNORMAL HIGH (ref 0.8–1.2)
Prothrombin Time: 15.7 seconds — ABNORMAL HIGH (ref 11.4–15.2)

## 2021-07-01 MED ORDER — METOPROLOL SUCCINATE ER 50 MG PO TB24
50.0000 mg | ORAL_TABLET | Freq: Every day | ORAL | Status: DC
  Administered 2021-07-02: 50 mg via ORAL
  Filled 2021-07-01 (×2): qty 1

## 2021-07-01 MED ORDER — HYDRALAZINE HCL 20 MG/ML IJ SOLN: 5.0000 mg | INTRAMUSCULAR | Status: DC | PRN

## 2021-07-01 MED ORDER — ACETAMINOPHEN 325 MG PO TABS
650.0000 mg | ORAL_TABLET | Freq: Four times a day (QID) | ORAL | Status: DC | PRN
Start: 2021-07-01 — End: 2021-07-03
  Filled 2021-07-01: qty 2

## 2021-07-01 MED ORDER — HYDROXYCHLOROQUINE SULFATE 200 MG PO TABS
200.0000 mg | ORAL_TABLET | Freq: Every day | ORAL | Status: DC
  Administered 2021-07-02 – 2021-07-03 (×2): 200 mg via ORAL
  Filled 2021-07-01 (×2): qty 1

## 2021-07-01 MED ORDER — LOPERAMIDE HCL 2 MG PO CAPS: 2.0000 mg | ORAL_CAPSULE | ORAL | Status: DC | PRN

## 2021-07-01 MED ORDER — ALBUTEROL SULFATE (2.5 MG/3ML) 0.083% IN NEBU: 2.5000 mg | INHALATION_SOLUTION | RESPIRATORY_TRACT | Status: DC | PRN

## 2021-07-01 MED ORDER — TORSEMIDE 20 MG PO TABS
40.0000 mg | ORAL_TABLET | Freq: Two times a day (BID) | ORAL | Status: DC
  Administered 2021-07-01 – 2021-07-02 (×3): 40 mg via ORAL
  Filled 2021-07-01 (×4): qty 2

## 2021-07-01 MED ORDER — ONDANSETRON HCL 4 MG/2ML IJ SOLN
4.0000 mg | Freq: Three times a day (TID) | INTRAMUSCULAR | Status: DC | PRN
  Administered 2021-07-01 – 2021-07-02 (×2): 4 mg via INTRAVENOUS
  Filled 2021-07-01 (×2): qty 2

## 2021-07-01 MED ORDER — CAPECITABINE 500 MG PO TABS: 1000.0000 mg | ORAL_TABLET | Freq: Two times a day (BID) | ORAL | Status: DC

## 2021-07-01 MED ORDER — MYCOPHENOLATE MOFETIL 250 MG PO CAPS
500.0000 mg | ORAL_CAPSULE | Freq: Two times a day (BID) | ORAL | Status: DC
  Administered 2021-07-01 – 2021-07-03 (×4): 500 mg via ORAL
  Filled 2021-07-01 (×5): qty 2

## 2021-07-01 MED ORDER — SODIUM CHLORIDE 0.9 % IV SOLN
10.0000 mL/h | Freq: Once | INTRAVENOUS | Status: DC
Start: 2021-07-01 — End: 2021-07-03

## 2021-07-01 MED ORDER — ALBUTEROL SULFATE HFA 108 (90 BASE) MCG/ACT IN AERS
2.0000 | INHALATION_SPRAY | RESPIRATORY_TRACT | Status: DC | PRN
Start: 2021-07-01 — End: 2021-07-01

## 2021-07-01 NOTE — ED Notes (Signed)
Pt placed on bedpan, pt cleaned, underwear replaced and pt slid up in bed, knees of bed elevated to support pt's back for comfort, pt states that she is feeling better, pt's call bell moved from the middle of the rail to the upper rail so pt would be able to see it easier
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
## 2021-07-01 NOTE — Telephone Encounter (Signed)
Pt was admitted to hospital today.

## 2021-07-01 NOTE — ED Triage Notes (Signed)
WEAKNESS AND SHORTNESS OF BREATH X 2 DAYS , NO OTHER COMPLAINTS

## 2021-07-01 NOTE — ED Notes (Signed)
Patient continues to report "burning" at IV site (#22 gauge right wrist); IV Team consult ordered for [place] IV

## 2021-07-01 NOTE — Telephone Encounter (Signed)
Mrs. Brockwell stated that she is having shortness of breath and she denied triage. She stated that she has been waiting for a prescription called in for her nebulizer.   [facility]

## 2021-07-01 NOTE — ED Notes (Addendum)
Patient reports "burning" at IV site; flushed/ blood return noted; Ice pack applied; elevated on towel

## 2021-07-01 NOTE — ED Notes (Signed)
Pt states that she has been having sob for the past 2 days states runny nose for the past 4 weeks, pt appears pale and easily out of breath with moving from the wheelchair to the bed, pt appears weak and needs assist to get into bed. Pt reports hx of lupus and breast cancer

## 2021-07-01 NOTE — Telephone Encounter (Signed)
I don't see any inhaler Rx request or h/o inhaler use recently.  Looks like oncology has referred her to ER for further evaluation of shortness of breath.  Plz call to touch base with patient, agree with ER evaluation for current symptoms.

## 2021-07-01 NOTE — ED Notes (Signed)
Patient is being discharged from the Urgent Care and sent to the Emergency Department via EMS. Per Dr. Lacinda Axon, patient is in need of higher level of care due to Shortness of breath. Patient is aware and verbalizes understanding of plan of care.  Vitals:   07/01/21 1050 07/01/21 1115  BP:  108/78  Pulse: (!) 102 (!) 101  Resp:  20  Temp:  98.5 F (36.9 C)  SpO2: 97% 96%

## 2021-07-01 NOTE — Telephone Encounter (Signed)
Pt called to this office left VM with message requesting a return call asap -note per message pt was very short of breath with labored speech.  This RN spoke with pt - with Melanie Holloway stating she cannot breath well " can hardly catch my breath" and is requesting a refill on her inhaler.  This RN inquired further- symptoms started last night- and is progressive.  This RN informed her concern may be greater then need for albuterol inhaler- and best to proceed to ER.  Melanie Holloway declined stating " I always have to wait so long "  This RN informed her concern is that she may be severely immunocompromised and or having a blood clot in her lung - with both being possibly life threatening.  Melanie Holloway was very reluctant - and this RN informed MD- who reiterated above advice.  This RN informed patient - who stated she would proceed as recommended.  This RN informed Melanie Holloway to let the ER know she is under chemo and unable to breath and MD is sending her for possible blood clot.  This RN called to [facility] ER and spoke with Romie Minus charge nurse per above per pending arrival and concern for acuity of pt's situation including pt is immunocompromised per chemotherapy use.

## 2021-07-01 NOTE — ED Triage Notes (Signed)
Pt c/o shortness of breath, dizziness and weakness. Started about 2 days denies chest pain. Denies cold symptoms or fever. She states she has had a cough for about a month.
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
## 2021-07-01 NOTE — ED Triage Notes (Signed)
First nurse: pt ems from [facility] urgent care for SOB and weakness that started 2 days ago. Pt received oral chemotherapy 2 days ago.
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
## 2021-07-01 NOTE — ED Notes (Signed)
Family member at bedside helping pt eat, pt is nauseated and is requesting the zofran, pt's family member states that the food that she was eating got stuck in her throat and made her nauseated and gag, pt vomited a small amount in an emesis bag

## 2021-07-02 ENCOUNTER — Encounter: Payer: Self-pay | Admitting: Internal Medicine

## 2021-07-02 DIAGNOSIS — Z833 Family history of diabetes mellitus: Secondary | ICD-10-CM | POA: Diagnosis not present

## 2021-07-02 DIAGNOSIS — Z170 Estrogen receptor positive status [ER+]: Secondary | ICD-10-CM | POA: Diagnosis not present

## 2021-07-02 DIAGNOSIS — R0600 Dyspnea, unspecified: Secondary | ICD-10-CM | POA: Diagnosis not present

## 2021-07-02 DIAGNOSIS — E559 Vitamin D deficiency, unspecified: Secondary | ICD-10-CM | POA: Diagnosis present

## 2021-07-02 DIAGNOSIS — R0602 Shortness of breath: Secondary | ICD-10-CM | POA: Diagnosis present

## 2021-07-02 DIAGNOSIS — C7951 Secondary malignant neoplasm of bone: Secondary | ICD-10-CM | POA: Diagnosis present

## 2021-07-02 DIAGNOSIS — Z803 Family history of malignant neoplasm of breast: Secondary | ICD-10-CM | POA: Diagnosis not present

## 2021-07-02 DIAGNOSIS — M3214 Glomerular disease in systemic lupus erythematosus: Secondary | ICD-10-CM | POA: Diagnosis present

## 2021-07-02 DIAGNOSIS — Z923 Personal history of irradiation: Secondary | ICD-10-CM | POA: Diagnosis not present

## 2021-07-02 DIAGNOSIS — C50919 Malignant neoplasm of unspecified site of unspecified female breast: Secondary | ICD-10-CM

## 2021-07-02 DIAGNOSIS — N184 Chronic kidney disease, stage 4 (severe): Secondary | ICD-10-CM | POA: Diagnosis present

## 2021-07-02 DIAGNOSIS — I5032 Chronic diastolic (congestive) heart failure: Secondary | ICD-10-CM | POA: Diagnosis present

## 2021-07-02 DIAGNOSIS — I130 Hypertensive heart and chronic kidney disease with heart failure and stage 1 through stage 4 chronic kidney disease, or unspecified chronic kidney disease: Secondary | ICD-10-CM | POA: Diagnosis present

## 2021-07-02 DIAGNOSIS — Z79899 Other long term (current) drug therapy: Secondary | ICD-10-CM | POA: Diagnosis not present

## 2021-07-02 DIAGNOSIS — Z9221 Personal history of antineoplastic chemotherapy: Secondary | ICD-10-CM | POA: Diagnosis not present

## 2021-07-02 DIAGNOSIS — D61818 Other pancytopenia: Principal | ICD-10-CM

## 2021-07-02 DIAGNOSIS — C50912 Malignant neoplasm of unspecified site of left female breast: Secondary | ICD-10-CM | POA: Diagnosis present

## 2021-07-02 DIAGNOSIS — Z9010 Acquired absence of unspecified breast and nipple: Secondary | ICD-10-CM | POA: Diagnosis not present

## 2021-07-02 DIAGNOSIS — Z8249 Family history of ischemic heart disease and other diseases of the circulatory system: Secondary | ICD-10-CM | POA: Diagnosis not present

## 2021-07-02 DIAGNOSIS — D569 Thalassemia, unspecified: Secondary | ICD-10-CM | POA: Diagnosis present

## 2021-07-02 DIAGNOSIS — Z20822 Contact with and (suspected) exposure to covid-19: Secondary | ICD-10-CM | POA: Diagnosis present

## 2021-07-02 DIAGNOSIS — D649 Anemia, unspecified: Secondary | ICD-10-CM | POA: Diagnosis not present

## 2021-07-02 DIAGNOSIS — D631 Anemia in chronic kidney disease: Secondary | ICD-10-CM | POA: Diagnosis present

## 2021-07-02 DIAGNOSIS — J9601 Acute respiratory failure with hypoxia: Secondary | ICD-10-CM | POA: Diagnosis present

## 2021-07-02 LAB — BASIC METABOLIC PANEL
Anion gap: 12 (ref 5–15)
BUN: 66 mg/dL — ABNORMAL HIGH (ref 6–20)
CO2: 20 mmol/L — ABNORMAL LOW (ref 22–32)
Calcium: 9 mg/dL (ref 8.9–10.3)
Chloride: 107 mmol/L (ref 98–111)
Creatinine, Ser: 2.33 mg/dL — ABNORMAL HIGH (ref 0.44–1.00)
GFR, Estimated: 24 mL/min — ABNORMAL LOW (ref >60)
Glucose, Bld: 89 mg/dL (ref 70–99)
Potassium: 3.6 mmol/L (ref 3.5–5.1)
Sodium: 139 mmol/L (ref 135–145)

## 2021-07-02 LAB — CBC
HCT: 23.1 % — ABNORMAL LOW (ref 36.0–46.0)
Hemoglobin: 8.1 g/dL — ABNORMAL LOW (ref 12.0–15.0)
MCH: 27.1 pg (ref 26.0–34.0)
MCHC: 35.1 g/dL (ref 30.0–36.0)
MCV: 77.3 fL — ABNORMAL LOW (ref 80.0–100.0)
Platelets: 27 10*3/uL — CL (ref 150–400)
RBC: 2.99 MIL/uL — ABNORMAL LOW (ref 3.87–5.11)
RDW: 17.9 % — ABNORMAL HIGH (ref 11.5–15.5)
WBC: 2.3 10*3/uL — ABNORMAL LOW (ref 4.0–10.5)
nRBC: 0 % (ref 0.0–0.2)

## 2021-07-02 LAB — RETICULOCYTES
Immature Retic Fract: 8.9 % (ref 2.3–15.9)
RBC.: 2.99 MIL/uL — ABNORMAL LOW (ref 3.87–5.11)
Retic Count, Absolute: 19.7 10*3/uL (ref 19.0–186.0)
Retic Ct Pct: 0.7 % (ref 0.4–3.1)

## 2021-07-02 LAB — VITAMIN B12: Vitamin B-12: 450 pg/mL (ref 180–914)

## 2021-07-02 MED ORDER — ENSURE ENLIVE PO LIQD
237.0000 mL | Freq: Two times a day (BID) | ORAL | Status: DC
  Administered 2021-07-02: 237 mL via ORAL

## 2021-07-02 NOTE — Progress Notes (Signed)
PT Cancellation Note  Patient Details Name: Melanie Holloway MRN: [id number] DOB: 1966-09-09   Cancelled Treatment:    Reason Eval/Treat Not Completed: Fatigue/lethargy limiting ability to participate. PT re-attempt this p.m. Pt received in bed reporting nausea with emesis bag with emesis present inside. Pt appears very fatigued and ill compared to this morning. PT will plan to hold due to current pt condition. Pt states RN is aware of current condition. Pt in agreement tomorrow would be better. Will re-attempt tomorrow as available.   Salem Caster. Fairly IV, PT, DPT Physical Therapist- [facility]  07/02/2021, 2:07 PM

## 2021-07-02 NOTE — Progress Notes (Signed)
Initial Nutrition Assessment  DOCUMENTATION CODES:  Not applicable  INTERVENTION:  Remove fat modifier from diet order to allow for more food selections Entered food preferences into dining software. Ensure Enlive po BID, each supplement provides 350 kcal and 20 grams of protein  NUTRITION DIAGNOSIS:  Increased nutrient needs related to cancer and cancer related treatments as evidenced by estimated needs.  GOAL:  Patient will meet greater than or equal to 90% of their needs  MONITOR:  PO intake, Supplement acceptance  REASON FOR ASSESSMENT:  Malnutrition Screening Tool    ASSESSMENT:  55 y.o. female with medical history of HTN, vitamin D deficiency, lupus, CKD4, CHF, breast cancer with mets to bone s/p of chemo and radiation therapy but currently on oral chemotherapy, presented to ED from home with shortness of breath and weakness over the last few days. Found to be profoundly anemic and with pancytopenia  Pt resting in bed with eyes closed at the time of visit. Not feeling well, did not open eyes during assessment. Visitor at bedside. Pt reports that PTA, appetite was fine. Typically eats 2 meals a day and will sometimes have a snack between. Reports she does well getting enough fluid, does drink ensure. Prefers chocolate flavor. Inquired about appetite this AM, pt reports that she did not eat much of her breakfast because lemon was put into her tea, which she does not like, and her french toast was overcooked. Will lift fat restriction from diet to give pt more dining choices. Will also add ensure BID to encourage adequate intake.  Pt reports that she has lost about 25 lbs in the last few months. No fat or muscle deficits present on exam. Reviewed hx and amount of weight loss is not seen in the last few months, but pt is noted to have lost ~17% in the last year which is concerning given her current cancer treatment.   Nutritionally Relevant Medications: Scheduled Meds:   mycophenolate  500 mg Oral BID   torsemide  40 mg Oral BID   PRN Meds: loperamide, ondansetron   Labs Reviewed: BUN 66, Creatinine 2.33 Platelets 27  NUTRITION - FOCUSED PHYSICAL EXAM: Flowsheet Row Most Recent Value  Orbital Region No depletion  Upper Arm Region No depletion  Thoracic and Lumbar Region No depletion  Buccal Region No depletion  Temple Region Mild depletion  Clavicle Bone Region No depletion  Clavicle and Acromion Bone Region No depletion  Scapular Bone Region No depletion  Dorsal Hand No depletion  Patellar Region No depletion  Anterior Thigh Region No depletion  Posterior Calf Region No depletion  Edema (RD Assessment) Mild  Hair Reviewed  Eyes Reviewed  Mouth Reviewed  Skin Reviewed  Nails Reviewed   Diet Order:   Diet Order             Diet 2 gram sodium Room service appropriate? Yes; Fluid consistency: Thin  Diet effective now                   EDUCATION NEEDS:  No education needs have been identified at this time  Skin:  Skin Assessment: Reviewed RN Assessment  Last BM:  9/12 - PTA  Height:  Ht Readings from Last 1 Encounters:  07/01/21 4\' 11"  (1.499 m)   Weight:  Wt Readings from Last 1 Encounters:  07/01/21 60 kg   Ideal Body Weight:  44.3 kg  BMI:  Body mass index is 26.72 kg/m.  Estimated Nutritional Needs:  Kcal:  1500-1700 kcal/d Protein:  75-90g/d Fluid:  1.5-1.8L/d  Ranell Patrick, RD, LDN Clinical Dietitian Pager on [facility]

## 2021-07-02 NOTE — Progress Notes (Signed)
PROGRESS NOTE    Melanie Holloway  NIO:[id number] DOB: 10/12/1966 DOA: 07/01/2021 PCP: Ria Bush, MD  Outpatient Specialists: oncology, rheumatology, nephrology    Brief Narrative:   Melanie Holloway is a 55 y.o. female with medical history significant of hypertension, lupus nephritis, CKD-4, CHF with EF of 70-75%, metastasized breast cancer s/p of chemo and radiation therapy), on oral chemotherapy, anemia, interstitial pneumonitis, pericarditis, who presents with shortness of breath and weakness.   Patient states that she has shortness of breath, generalized weakness, lightheadedness for more than 2 days.  Symptoms is worse on exertion. No unilateral numbness or tinglings in extremities.  No facial droop or slurred speech.  Patient denies dark stool or rectal bleeding.  Patient has mild dry cough, no chest pain.  No fever or chills.  Denies nausea vomiting, diarrhea or abdominal pain.  No symptoms of UTI.  Of note, thalassemia is on her medical problem list, but patient denies this medical issue.  Patient states that she had blood transfusion 5 days ago.  Chart review showed hemoglobin 6.4 on 06/25/2021, but no posttransfusion hemoglobin level available. Today her hemoglobin 6.6.   Assessment & Plan:   Principal Problem:   Symptomatic anemia Active Problems:   Essential hypertension   Metastatic breast cancer (HCC)   Anemia associated with stage 4 chronic renal failure (HCC)   Lupus erythematosus tumidus   Bone metastases (HCC)   Pancytopenia (HCC)   CKD (chronic kidney disease) stage 4, GFR 15-29 ml/min (HCC)   Chronic diastolic CHF (congestive heart failure) (HCC)   Symptomatic anemia and Pancytopenia : Hgb 6.6. pt states that her baseline hemoglobin usually is 8-9.  No rectal bleeding.  Negative FOBT.  Most likely due to anemia 2/2 chronic kidney disease stage IV and new chemotherapy. Transfused 1 unit 9/13 with appropriate hgb rise this morning to 8.1. plts however are lower  than baseline, 27 today - oncology consulted, has seen. Advises holding home chemotherapy, monitoring overnight to ensure platelets do not drop further, and with close outpatient oncology f/u   Essential hypertension -IV hydralazine as needed -Metoprolol -Patient is on torsemide for CHF   Metastatic breast cancer with bone metastases  -holding home capecitabine -f/u with oncologist, Dr. Jana Hakim   Anemia associated with stage 4 chronic renal failure ([place]) -See above   Lupus erythematosus tumidus -Continue home Plaquenil and CellCept   CKD (chronic kidney disease) stage 4, GFR 15-29 ml/min ([place]): Baseline creatinine 2.0-2.4.  Her creatinine is at baseline   Chronic diastolic CHF (congestive heart failure) [facility]): Patient used to have systolic CHF, but recent EF on 01/18/2020 showed EF of 70 to 75%.  Patient does not have leg edema.  Her shortness breath is most likely due to anemia.  No JVD.  CHF seem to be compensated. -Continue home torsemide 40 mg twice daily     DVT prophylaxis: SCDs Code Status: full Family Communication: none @ bedside  Level of care: Med-Surg Status is: Observation  The patient will require care spanning > 2 midnights and should be moved to inpatient because: Inpatient level of care appropriate due to severity of illness  Dispo: The patient is from: Home              Anticipated d/c is to: Home              Patient currently is not medically stable to d/c.   Difficult to place patient No        Consultants:  Heme/onc  Procedures: none  Antimicrobials:  none    Subjective: This morning no complaints, says shortness of breath resolved w/ blood transfusion  Objective: Vitals:   07/01/21 2236 07/01/21 2300 07/02/21 0448 07/02/21 0813  BP: 129/85 (!) 126/95 101/62 118/81  Pulse: 91 90 98 95  Resp: 18 18 17 16   Temp: 98.7 F (37.1 C) 98.9 F (37.2 C) 98.8 F (37.1 C) 99.1 F (37.3 C)  TempSrc:  Oral    SpO2: 100% 100% 97% 100%   Weight:      Height:        Intake/Output Summary (Last 24 hours) at 07/02/2021 0930 Last data filed at 07/01/2021 2300 Gross per 24 hour  Intake 350 ml  Output --  Net 350 ml   Filed Weights   07/01/21 1243  Weight: 60 kg    Examination:  General exam: Appears calm and comfortable . Chronically ill appearing Respiratory system: Clear to auscultation. Respiratory effort normal. Cardiovascular system: S1 & S2 heard, RRR. No JVD, murmurs, rubs, gallops or clicks. No pedal edema. Gastrointestinal system: Abdomen is nondistended, soft and nontender. No organomegaly or masses felt. Normal bowel sounds heard. Central nervous system: Alert and oriented. No focal neurological deficits. Extremities: moves all 4 Skin: No rashes, lesions or ulcers Psychiatry: Judgement and insight appear normal. Affect is flat    Data Reviewed: I have personally reviewed following labs and imaging studies  CBC: Recent Labs  Lab 06/25/21 1314 07/01/21 1240 07/02/21 0627  WBC 2.8* 2.1* 2.3*  NEUTROABS 1.8 1.2*  --   HGB 6.4* 6.6* 8.1*  HCT 19.5* 19.2* 23.1*  MCV 83.7 79.7* 77.3*  PLT 44* 31* 27*   Basic Metabolic Panel: Recent Labs  Lab 06/25/21 1314 07/01/21 1240 07/02/21 0627  NA 137 136 139  K 3.7 3.5 3.6  CL 102 104 107  CO2 20* 19* 20*  GLUCOSE 91 90 89  BUN 54* 61* 66*  CREATININE 2.37* 2.50* 2.33*  CALCIUM 9.3 9.2 9.0   GFR: Estimated Creatinine Clearance: 21.2 mL/min (A) (by C-G formula based on SCr of 2.33 mg/dL (H)). Liver Function Tests: Recent Labs  Lab 06/25/21 1314  AST 34  ALT 10  ALKPHOS 175*  BILITOT 0.7  PROT 7.3  ALBUMIN 3.3*   No results for input(s): LIPASE, AMYLASE in the last 168 hours. No results for input(s): AMMONIA in the last 168 hours. Coagulation Profile: Recent Labs  Lab 07/01/21 1240  INR 1.3*   Cardiac Enzymes: No results for input(s): CKTOTAL, CKMB, CKMBINDEX, TROPONINI in the last 168 hours. BNP (last 3 results) No results for  input(s): PROBNP in the last 8760 hours. HbA1C: No results for input(s): HGBA1C in the last 72 hours. CBG: No results for input(s): GLUCAP in the last 168 hours. Lipid Profile: No results for input(s): CHOL, HDL, LDLCALC, TRIG, CHOLHDL, LDLDIRECT in the last 72 hours. Thyroid Function Tests: No results for input(s): TSH, T4TOTAL, FREET4, T3FREE, THYROIDAB in the last 72 hours. Anemia Panel: Recent Labs    07/01/21 1240 07/02/21 0627  FOLATE 4.5*  --   FERRITIN 4,929*  --   TIBC 238*  --   IRON 215*  --   RETICCTPCT  --  0.7   Urine analysis:    Component Value Date/Time   COLORURINE STRAW (A) 01/16/2020 1755   APPEARANCEUR CLEAR (A) 01/16/2020 1755   LABSPEC 1.010 01/16/2020 1755   PHURINE 5.0 01/16/2020 1755   GLUCOSEU NEGATIVE 01/16/2020 1755   HGBUR NEGATIVE 01/16/2020 1755   BILIRUBINUR  NEGATIVE 01/16/2020 1755   BILIRUBINUR negative 09/29/2017 [place] 01/16/2020 1755   PROTEINUR NEGATIVE 01/16/2020 1755   UROBILINOGEN 0.2 09/29/2017 1246   NITRITE NEGATIVE 01/16/2020 1755   LEUKOCYTESUR MODERATE (A) 01/16/2020 1755   Sepsis Labs: @LABRCNTIP (procalcitonin:4,lacticidven:4)  ) Recent Results (from the past 240 hour(s))  Resp Panel by RT-PCR (Flu A&B, Covid) Nasopharyngeal Swab     Status: None   Collection Time: 07/01/21  2:38 PM   Specimen: Nasopharyngeal Swab; Nasopharyngeal(NP) swabs in vial transport medium  Result Value Ref Range Status   SARS Coronavirus 2 by RT PCR NEGATIVE NEGATIVE Final    Comment: (NOTE) SARS-CoV-2 target nucleic acids are NOT DETECTED.  The SARS-CoV-2 RNA is generally detectable in upper respiratory specimens during the acute phase of infection. The lowest concentration of SARS-CoV-2 viral copies this assay can detect is 138 copies/mL. A negative result does not preclude SARS-Cov-2 infection and should not be used as the sole basis for treatment or other patient management decisions. A negative result may occur  with  improper specimen collection/handling, submission of specimen other than nasopharyngeal swab, presence of viral mutation(s) within the areas targeted by this assay, and inadequate number of viral copies(<138 copies/mL). A negative result must be combined with clinical observations, patient history, and epidemiological information. The expected result is Negative.  Fact Sheet for Patients:  EntrepreneurPulse.com.au  Fact Sheet for Healthcare Providers:  IncredibleEmployment.be  This test is no t yet approved or cleared by the [place] FDA and  has been authorized for detection and/or diagnosis of SARS-CoV-2 by FDA under an Emergency Use Authorization (EUA). This EUA will remain  in effect (meaning this test can be used) for the duration of the COVID-19 declaration under Section 564(b)(1) of the Act, 21 U.S.C.section 360bbb-3(b)(1), unless the authorization is terminated  or revoked sooner.       Influenza A by PCR NEGATIVE NEGATIVE Final   Influenza B by PCR NEGATIVE NEGATIVE Final    Comment: (NOTE) The Xpert Xpress SARS-CoV-2/FLU/RSV plus assay is intended as an aid in the diagnosis of influenza from Nasopharyngeal swab specimens and should not be used as a sole basis for treatment. Nasal washings and aspirates are unacceptable for Xpert Xpress SARS-CoV-2/FLU/RSV testing.  Fact Sheet for Patients: EntrepreneurPulse.com.au  Fact Sheet for Healthcare Providers: IncredibleEmployment.be  This test is not yet approved or cleared by the [place] FDA and has been authorized for detection and/or diagnosis of SARS-CoV-2 by FDA under an Emergency Use Authorization (EUA). This EUA will remain in effect (meaning this test can be used) for the duration of the COVID-19 declaration under Section 564(b)(1) of the Act, 21 U.S.C. section 360bbb-3(b)(1), unless the authorization is terminated  or revoked.  Performed at [facility], [address], [place], [place] [address]          Radiology Studies: DG Chest 2 View  Result Date: 07/01/2021 CLINICAL DATA:  Shortness of breath.  History of breast cancer. EXAM: CHEST - 2 VIEW COMPARISON:  May 23, 2021. FINDINGS: The heart size and mediastinal contours are within normal limits. Both lungs are clear. Sclerotic density is noted in midthoracic vertebral body consistent with metastatic disease. IMPRESSION: No active cardiopulmonary disease. Sclerotic density noted in midthoracic vertebral body consistent metastatic disease. Electronically Signed   By: Marijo Conception M.D.   On: 07/01/2021 13:17        Scheduled Meds:  hydroxychloroquine  200 mg Oral Daily   metoprolol succinate  50 mg Oral  Daily   mycophenolate  500 mg Oral BID   torsemide  40 mg Oral BID   Continuous Infusions:  sodium chloride       LOS: 0 days    Time spent: 40 min    Desma Maxim, MD [facility]   If 7PM-7AM, please contact night-coverage [email or web address] Password [email or web address] 07/02/2021, 9:30 AM
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
## 2021-07-02 NOTE — Progress Notes (Signed)
Patient arrived to room 159 in stable condition. Blood transfusing upon arrival. AAOx4. Denies pain. Plan of care reviewed with patient. Room/unit orientation completed. Call bell within reach.

## 2021-07-02 NOTE — Progress Notes (Signed)
PT Cancellation Note  Patient Details Name: Melanie Holloway MRN: [id number] DOB: 16-Mar-1966   Cancelled Treatment:    Reason Eval/Treat Not Completed: Patient declined, no reason specified. Orders received and chart reviewed. Upon entry in room pt refusing to work with PT. PT provided multiple attempts. Providing education on PT role to ensure safe D/c plan with pt. After education and further encouragement pt continuing to refuse. PT will re-attempt at alter time/date as available.   [place]em Caster. Fairly IV, PT, DPT Physical Therapist- [facility]  07/02/2021, 9:13 AM

## 2021-07-03 ENCOUNTER — Other Ambulatory Visit: Payer: No Typology Code available for payment source

## 2021-07-03 ENCOUNTER — Inpatient Hospital Stay (HOSPITAL_BASED_OUTPATIENT_CLINIC_OR_DEPARTMENT_OTHER): Payer: No Typology Code available for payment source | Admitting: Oncology

## 2021-07-03 ENCOUNTER — Inpatient Hospital Stay: Payer: No Typology Code available for payment source

## 2021-07-03 DIAGNOSIS — C50919 Malignant neoplasm of unspecified site of unspecified female breast: Secondary | ICD-10-CM

## 2021-07-03 LAB — BASIC METABOLIC PANEL
Anion gap: 10 (ref 5–15)
BUN: 68 mg/dL — ABNORMAL HIGH (ref 6–20)
CO2: 20 mmol/L — ABNORMAL LOW (ref 22–32)
Calcium: 9 mg/dL (ref 8.9–10.3)
Chloride: 106 mmol/L (ref 98–111)
Creatinine, Ser: 2.42 mg/dL — ABNORMAL HIGH (ref 0.44–1.00)
GFR, Estimated: 23 mL/min — ABNORMAL LOW (ref >60)
Glucose, Bld: 95 mg/dL (ref 70–99)
Potassium: 3.5 mmol/L (ref 3.5–5.1)
Sodium: 136 mmol/L (ref 135–145)

## 2021-07-03 LAB — CBC
HCT: 23.2 % — ABNORMAL LOW (ref 36.0–46.0)
Hemoglobin: 8.1 g/dL — ABNORMAL LOW (ref 12.0–15.0)
MCH: 26.7 pg (ref 26.0–34.0)
MCHC: 34.9 g/dL (ref 30.0–36.0)
MCV: 76.6 fL — ABNORMAL LOW (ref 80.0–100.0)
Platelets: 26 10*3/uL — CL (ref 150–400)
RBC: 3.03 MIL/uL — ABNORMAL LOW (ref 3.87–5.11)
RDW: 18.1 % — ABNORMAL HIGH (ref 11.5–15.5)
WBC: 3.2 10*3/uL — ABNORMAL LOW (ref 4.0–10.5)
nRBC: 0.6 % — ABNORMAL HIGH (ref 0.0–0.2)

## 2021-07-03 NOTE — Progress Notes (Signed)
PT Cancellation Note  Patient Details Name: Melanie Holloway MRN: [id number] DOB: Dec 20, 1965   Cancelled Treatment:    Reason Eval/Treat Not Completed: Patient declined, no reason specified. PT re-attempt eval this a.m. at 8:56. Attending RN in room  reporting BP at 90/59 mm Hg and believes it was due to medications pt receiving. PT held for later this a.m. to see if BP improves prior to initiating OOB mobility. Upon later morning attempt pt requesting PT to attempt another time due to being on phone with family. Will re-attempt at later time and date.   Salem Caster. Fairly IV, PT, DPT Physical Therapist- [facility]  07/03/2021, 11:16 AM

## 2021-07-03 NOTE — TOC Progression Note (Signed)
Transition of Care [facility]) - Progression Note    Patient Details  Name: Melanie Holloway MRN: [id number] Date of Birth: [birth year]  Transition of Care [facility]) CM/SW [place], RN Phone Number: 07/03/2021, 9:54 AM  Clinical Narrative:     Met with the patient to discuss DC plan and needs She is already set up and open with [facility], I notified Corene Cornea at [facility] that she will DC today, she has a RW at home, she has transportation and can afford her medications       Expected Discharge Plan and Services           Expected Discharge Date: 07/03/21                                     Social Determinants of Health (SDOH) Interventions    Readmission Risk Interventions Readmission Risk Prevention Plan 05/16/2021  Transportation Screening Complete  PCP or Specialist Appt within 3-5 Days Complete  HRI or [place] Complete  Social Work Consult for [place] Planning/Counseling Complete  Palliative Care Screening Not Applicable  Medication Review Press photographer) Complete  Some recent data might be hidden

## 2021-07-03 NOTE — TOC Progression Note (Signed)
Transition of Care [facility]) - Progression Note    Patient Details  Name: Melanie Holloway MRN: [id number] Date of Birth: 12-23-1965  Transition of Care [facility]) CM/SW [place], RN Phone Number: 07/03/2021, 1:08 PM  Clinical Narrative:      The patient does not qualify for Meals on Wheels as she is not 55 years old, I reached out to [employer] and spoke to [place], I provided the patient's Demographic information and they will call her to set up meal delivery on Thursdays for weekly meals      Expected Discharge Plan and Services           Expected Discharge Date: 07/03/21                                     Social Determinants of Health (SDOH) Interventions    Readmission Risk Interventions Readmission Risk Prevention Plan 05/16/2021  Transportation Screening Complete  PCP or Specialist Appt within 3-5 Days Complete  HRI or [place] Complete  Social Work Consult for [employer] Planning/Counseling Complete  Palliative Care Screening Not Applicable  Medication Review Press photographer) Complete  Some recent data might be hidden

## 2021-07-03 NOTE — Evaluation (Signed)
Physical Therapy Evaluation Patient Details Name: Melanie Holloway MRN: [id number] DOB: 1966-08-06 Today's Date: 07/03/2021  History of Present Illness  Melanie Holloway is a 55 y.o. female with a past medical history of anemia, breast cancer on oral chemotherapy, CHF, CKD, hypertension, presents to the emergency department for shortness of breath and generalized weakness.   Clinical Impression  Pt admitted with above diagnosis. Pt received supine in bed agreeable to PT services. Husband in room. Pt able to sit Eob with supervision however does require increased time with bed rails and HOB slightly elevated. Pt reporting biggest limiting factor right now is LBP. Able to stand at EOB with minguard with mod VC's to lock brakes on 4WW and safe use of hands with poor carryover. Pt ambulating slowly with step to pattern and significant forward trunk lean on 4WW with poor use of safe hand placement. Minguard provided for safety due to posture with walking and no correction despite education on safe use of 4WW. Pt overall does remain stable but appears significantly deconditioned. Returned to sitting EOB. With further questions pt reporting B LBP that is radicular in nature with reports of LLE concordant pain in L3 dermatomal pattern currently but does report having post thigh pain in BLE's down to B popliteal fossa which occurs intermittently per pt. Sensation to LT is normal bilaterally and is able to perform ankle DF against gravity. Pt denies saddle anesthesia, incontinence with B/B function, blood in urine or stool. Per pt RN has been notified but pt hesitant to inform MD's because she thought it would lengthen her hospital admission. Pt requiring modA for LE's to return to supine in bed. Placement in 2 pillows underneath knees which pt reports reducing current LBP. Per RN, MD has been notified by RN about LBP. Pt reports she plans to f/u with her PCP upon D/c. Pt would benefit from [facility] PT to address poor safety  awareness and use of 4WW and limitations in bed mobility/strength/endurance with walking tasks to optimize safe functional movement in home environment. Pt currently with functional limitations due to the deficits listed below (see PT Problem List). Pt will benefit from skilled PT to increase their independence and safety with mobility to allow discharge to the venue listed below.    BP post session: 104/70 mm Hg    Recommendations for follow up therapy are one component of a multi-disciplinary discharge planning process, led by the attending physician.  Recommendations may be updated based on patient status, additional functional criteria and insurance authorization.  Follow Up Recommendations Home health PT;Supervision for mobility/OOB    Equipment Recommendations  None recommended by PT    Recommendations for Other Services       Precautions / Restrictions Precautions Precautions: Fall Restrictions Weight Bearing Restrictions: No      Mobility  Bed Mobility Overal bed mobility: Needs Assistance Bed Mobility: Rolling;Supine to Sit Rolling: Supervision   Supine to sit: HOB elevated;Supervision     General bed mobility comments: LImited by 8/10 LBP. Use of bed rails Patient Response: Cooperative;Flat affect  Transfers Overall transfer level: Needs assistance Equipment used: 4-wheeled walker Transfers: Sit to/from Stand Sit to Stand: Min guard         General transfer comment: VC's for locking brakes and proper hand placement. Poor carryover despite cues.  Ambulation/Gait Ambulation/Gait assistance: Min guard Gait Distance (Feet): 20 Feet Assistive device: 4-wheeled walker Gait Pattern/deviations: Step-to pattern;Trunk flexed;Narrow base of support     General Gait Details: Poor hand placement  on 4ww. Hunched over on 4ww during mobility despite cues on proper use. Overall steady, just appears deconditioned.  Stairs            Wheelchair Mobility     Modified Rankin (Stroke Patients Only)       Balance Overall balance assessment: Needs assistance Sitting-balance support: Bilateral upper extremity supported;Feet supported Sitting balance-Leahy Scale: Fair       Standing balance-Leahy Scale: Poor Standing balance comment: Forward trunk lean over 4WW for support.                             Pertinent Vitals/Pain Pain Assessment: 0-10 Pain Score: 8  Pain Location: Low back with radicular symptoms. LLE in L3 dermatomal pattern. Pain Descriptors / Indicators: Burning;Tingling Pain Intervention(s): Limited activity within patient's tolerance;Monitored during session;Repositioned    Home Living Family/patient expects to be discharged to:: Private residence Living Arrangements: Spouse/significant other Available Help at Discharge: Available PRN/intermittently;Family (reports husband and son work. Most avilable during afternoons/evenings to help.) Type of Home: House Home Access: Ramped entrance     Home Layout: One level Home Equipment: [place] - 4 wheels;Cane - single point;Shower seat      Prior Function Level of Independence: Needs assistance   Gait / Transfers Assistance Needed: Uses [phone] for all mobility needs  ADL's / Homemaking Assistance Needed: Family assist with bathing and dressing ADL's        Hand Dominance   Dominant Hand: Right    Extremity/Trunk Assessment   Upper Extremity Assessment Upper Extremity Assessment: Generalized weakness    Lower Extremity Assessment Lower Extremity Assessment: Generalized weakness;LLE deficits/detail LLE Sensation: decreased light touch (at L3 dermatome)    Cervical / Trunk Assessment Cervical / Trunk Assessment: Kyphotic  Communication   Communication: No difficulties  Cognition Arousal/Alertness: Awake/alert Behavior During Therapy: WFL for tasks assessed/performed Overall Cognitive Status: Within Functional Limits for tasks assessed                                         General Comments      Exercises Other Exercises Other Exercises: Role of PT in acute setting; D/c recs; positions of comfort to assist in alleviating LBP.   Assessment/Plan    PT Assessment Patient needs continued PT services  PT Problem List Decreased strength;Decreased mobility;Decreased safety awareness;Decreased activity tolerance;Decreased knowledge of use of DME;Pain       PT Treatment Interventions DME instruction;Therapeutic exercise;Gait training;Balance training;Neuromuscular re-education;Functional mobility training;Therapeutic activities;Patient/family education    PT Goals (Current goals can be found in the Care Plan section)  Acute Rehab PT Goals Patient Stated Goal: go home PT Goal Formulation: With patient Time For Goal Achievement: 07/17/21 Potential to Achieve Goals: Fair    Frequency Min 2X/week   Barriers to discharge   Family not available to provide 24/7 support    Co-evaluation               AM-PAC PT "6 Clicks" Mobility  Outcome Measure Help needed turning from your back to your side while in a flat bed without using bedrails?: A Little Help needed moving from lying on your back to sitting on the side of a flat bed without using bedrails?: A Little Help needed moving to and from a bed to a chair (including a wheelchair)?: A Little Help needed standing up from a chair  using your arms (e.g., wheelchair or bedside chair)?: A Little Help needed to walk in hospital room?: A Lot Help needed climbing 3-5 steps with a railing? : A Lot 6 Click Score: 16    End of Session Equipment Utilized During Treatment: Gait belt Activity Tolerance: Patient limited by fatigue Patient left: in bed;with call bell/phone within reach Nurse Communication: Mobility status PT Visit Diagnosis: Unsteadiness on feet (R26.81);Muscle weakness (generalized) (M62.81);Pain;Difficulty in walking, not elsewhere classified (R26.2) Pain  - part of body:  (low back, radicular in LLE L3 dermatome)    Time: 8811-0315 PT Time Calculation (min) (ACUTE ONLY): 21 min   Charges:   PT Evaluation $PT Eval Moderate Complexity: 1 Mod PT Treatments $Therapeutic Activity: 8-22 mins        Ilani Otterson M. Fairly IV, PT, DPT Physical Therapist- [facility]  07/03/2021, 2:24 PM

## 2021-07-03 NOTE — Progress Notes (Signed)
Melanie Holloway was admitted to [facility] 2 days ago with severe anemia and was observed also for thrombocytopenia.  Accordingly her visit today was canceled.  I am rescheduling it for next week.
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
## 2021-07-03 NOTE — Discharge Summary (Signed)
Melanie Holloway:[id number] DOB: [birth year] DOA: 07/01/2021  PCP: Ria Bush, MD  Admit date: 07/01/2021 Discharge date: 07/03/2021  Time spent: 35 minutes  Recommendations for Outpatient Follow-up:  Close f/u with oncology     Discharge Diagnoses:  Principal Problem:   Symptomatic anemia Active Problems:   Essential hypertension   Metastatic breast cancer ([place])   Anemia associated with stage 4 chronic renal failure (HCC)   Lupus erythematosus tumidus   Bone metastases (HCC)   Pancytopenia (HCC)   CKD (chronic kidney disease) stage 4, GFR 15-29 ml/min (HCC)   Chronic diastolic CHF (congestive heart failure) ([place])   Discharge Condition: stable  Diet recommendation: heart healthy  Filed Weights   07/01/21 1243  Weight: 60 kg    History of present illness:  Melanie Holloway is a 55 y.o. female with medical history significant of hypertension, lupus nephritis, CKD-4, CHF with EF of 70-75%, metastasized breast cancer s/p of chemo and radiation therapy), on oral chemotherapy, anemia, interstitial pneumonitis, pericarditis, who presents with shortness of breath and weakness.   Patient states that she has shortness of breath, generalized weakness, lightheadedness for more than 2 days.  Symptoms is worse on exertion. No unilateral numbness or tinglings in extremities.  No facial droop or slurred speech.  Patient denies dark stool or rectal bleeding.  Patient has mild dry cough, no chest pain.  No fever or chills.  Denies nausea vomiting, diarrhea or abdominal pain.  No symptoms of UTI.  Of note, thalassemia is on her medical problem list, but patient denies this medical issue.  Patient states that she had blood transfusion 5 days ago.  Chart review showed hemoglobin 6.4 on 06/25/2021, but no posttransfusion hemoglobin level available. Today her hemoglobin 6.6.  Hospital Course:  Admitted to hospital with symptomatic anemia hgb 6.6. baseline 8-9. Negative fobt. Etiology likely  multifactorial (ckd4, new chemotherapy). Transfused 1 unit w/ appropriate rise to 8.1 which was stable the following day. Noted to have worsening of thrombocytopenia, baseline 50s, here mid 20s. Oncology saw, advised monitoring overnight 9/14 which we did, platelets stable 27>26. Oncology discussed case w/ patient's outpatient oncologist, plan is close oncology f/u. We will hold home Xeloda for now.   Procedures: Prbc transfusion 1 unit   Consultations: oncology  Discharge Exam: Vitals:   07/03/21 0831 07/03/21 0853  BP: 95/64 (!) 90/59  Pulse: 90   Resp: 18   Temp: 99 F (37.2 C)   SpO2: 97%     General exam: Appears calm and comfortable . Chronically ill appearing Respiratory system: Clear to auscultation. Respiratory effort normal. Cardiovascular system: S1 & S2 heard, RRR. No JVD, murmurs, rubs, gallops or clicks. No pedal edema. Gastrointestinal system: Abdomen is nondistended, soft and nontender. No organomegaly or masses felt. Normal bowel sounds heard. Central nervous system: Alert and oriented. No focal neurological deficits. Extremities: moves all 4 Skin: No rashes, lesions or ulcers Psychiatry: Judgement and insight appear normal. Affect is flat  Discharge Instructions   Discharge Instructions     Diet - low sodium heart healthy   Complete by: As directed    Increase activity slowly   Complete by: As directed       Allergies as of 07/03/2021   No Known Allergies      Medication List     STOP taking these medications    capecitabine 500 MG tablet Commonly known as: XELODA       TAKE these medications    Deferiprone 1000 MG Tabs Commonly  known as: Ferriprox Take 1 tablet by mouth in the morning and at bedtime.   hydroxychloroquine 200 MG tablet Commonly known as: PLAQUENIL Take 1 tablet (200 mg total) by mouth daily.   ibuprofen 200 MG tablet Commonly known as: ADVIL Take 400-600 mg by mouth every 6 (six) hours as needed for mild pain.    loperamide 2 MG capsule Commonly known as: IMODIUM Take 1 capsule (2 mg total) by mouth as needed for diarrhea or loose stools. Take 1 capsule after the first diarrhea event, and repeat with each additional diarrhea, maximum 6 tablets per day   metoprolol succinate 50 MG 24 hr tablet Commonly known as: TOPROL-XL Take 50 mg by mouth daily.   mycophenolate 250 MG capsule Commonly known as: CELLCEPT Take 2 capsules (500 mg total) by mouth 2 (two) times daily.   ondansetron 8 MG tablet Commonly known as: ZOFRAN Take 1 tablet (8 mg total) by mouth 2 (two) times daily as needed for nausea or vomiting.   potassium chloride 10 MEQ tablet Commonly known as: KLOR-CON Take 10 mEq by mouth every evening.   torsemide 20 MG tablet Commonly known as: DEMADEX Take 2 tablets (40 mg total) by mouth 2 (two) times daily.   UNABLE TO FIND Med Name:Sildenafil 2% cream use bid prn to hand and feet per [facility]IP[facility]       No Known Allergies    The results of significant diagnostics from this hospitalization (including imaging, microbiology, ancillary and laboratory) are listed below for reference.    Significant Diagnostic Studies: DG Chest 2 View  Result Date: 07/01/2021 CLINICAL DATA:  Shortness of breath.  History of breast cancer. EXAM: CHEST - 2 VIEW COMPARISON:  May 23, 2021. FINDINGS: The heart size and mediastinal contours are within normal limits. Both lungs are clear. Sclerotic density is noted in midthoracic vertebral body consistent with metastatic disease. IMPRESSION: No active cardiopulmonary disease. Sclerotic density noted in midthoracic vertebral body consistent metastatic disease. Electronically Signed   By: Marijo Conception M.D.   On: 07/01/2021 13:17    Microbiology: Recent Results (from the past 240 hour(s))  Resp Panel by RT-PCR (Flu A&B, Covid) Nasopharyngeal Swab     Status: None   Collection Time: 07/01/21  2:38 PM   Specimen: Nasopharyngeal Swab; Nasopharyngeal(NP) swabs in  vial transport medium  Result Value Ref Range Status   SARS Coronavirus 2 by RT PCR NEGATIVE NEGATIVE Final    Comment: (NOTE) SARS-CoV-2 target nucleic acids are NOT DETECTED.  The SARS-CoV-2 RNA is generally detectable in upper respiratory specimens during the acute phase of infection. The lowest concentration of SARS-CoV-2 viral copies this assay can detect is 138 copies/mL. A negative result does not preclude SARS-Cov-2 infection and should not be used as the sole basis for treatment or other patient management decisions. A negative result may occur with  improper specimen collection/handling, submission of specimen other than nasopharyngeal swab, presence of viral mutation(s) within the areas targeted by this assay, and inadequate number of viral copies(<138 copies/mL). A negative result must be combined with clinical observations, patient history, and epidemiological information. The expected result is Negative.  Fact Sheet for Patients:  EntrepreneurPulse.com.au  Fact Sheet for Healthcare Providers:  IncredibleEmployment.be  This test is no t yet approved or cleared by the [place] FDA and  has been authorized for detection and/or diagnosis of SARS-CoV-2 by FDA under an Emergency Use Authorization (EUA). This EUA will remain  in effect (meaning this test can be used) for  the duration of the COVID-19 declaration under Section 564(b)(1) of the Act, 21 U.S.C.section 360bbb-3(b)(1), unless the authorization is terminated  or revoked sooner.       Influenza A by PCR NEGATIVE NEGATIVE Final   Influenza B by PCR NEGATIVE NEGATIVE Final    Comment: (NOTE) The Xpert Xpress SARS-CoV-2/FLU/RSV plus assay is intended as an aid in the diagnosis of influenza from Nasopharyngeal swab specimens and should not be used as a sole basis for treatment. Nasal washings and aspirates are unacceptable for Xpert Xpress SARS-CoV-2/FLU/RSV testing.  Fact  Sheet for Patients: EntrepreneurPulse.com.au  Fact Sheet for Healthcare Providers: IncredibleEmployment.be  This test is not yet approved or cleared by the [place] FDA and has been authorized for detection and/or diagnosis of SARS-CoV-2 by FDA under an Emergency Use Authorization (EUA). This EUA will remain in effect (meaning this test can be used) for the duration of the COVID-19 declaration under Section 564(b)(1) of the Act, 21 U.S.C. section 360bbb-3(b)(1), unless the authorization is terminated or revoked.  Performed at [facility], [address] [place], [place] [address]      Labs: Basic Metabolic Panel: Recent Labs  Lab 07/01/21 1240 07/02/21 0627 07/03/21 0425  NA 136 139 136  K 3.5 3.6 3.5  CL 104 107 106  CO2 19* 20* 20*  GLUCOSE 90 89 95  BUN 61* 66* 68*  CREATININE 2.50* 2.33* 2.42*  CALCIUM 9.2 9.0 9.0   Liver Function Tests: No results for input(s): AST, ALT, ALKPHOS, BILITOT, PROT, ALBUMIN in the last 168 hours. No results for input(s): LIPASE, AMYLASE in the last 168 hours. No results for input(s): AMMONIA in the last 168 hours. CBC: Recent Labs  Lab 07/01/21 1240 07/02/21 0627 07/03/21 0425  WBC 2.1* 2.3* 3.2*  NEUTROABS 1.2*  --   --   HGB 6.6* 8.1* 8.1*  HCT 19.2* 23.1* 23.2*  MCV 79.7* 77.3* 76.6*  PLT 31* 27* 26*   Cardiac Enzymes: No results for input(s): CKTOTAL, CKMB, CKMBINDEX, TROPONINI in the last 168 hours. BNP: BNP (last 3 results) Recent Labs    05/14/21 1535 07/01/21 1240  BNP 17.0 272.6*    ProBNP (last 3 results) No results for input(s): PROBNP in the last 8760 hours.  CBG: No results for input(s): GLUCAP in the last 168 hours.     Signed:  Desma Maxim MD.  [facility] 07/03/2021, 9:44 AM

## 2021-07-04 ENCOUNTER — Telehealth: Payer: Self-pay | Admitting: Oncology

## 2021-07-04 ENCOUNTER — Telehealth: Payer: Self-pay

## 2021-07-04 LAB — TYPE AND SCREEN
ABO/RH(D): B POS
Antibody Screen: POSITIVE
Donor AG Type: NEGATIVE
Unit division: 0
Unit division: 0
Unit division: 0

## 2021-07-04 LAB — BPAM RBC
Blood Product Expiration Date: 202209192359
Blood Product Expiration Date: 202209202359
Blood Product Expiration Date: 202209202359
ISSUE DATE / TIME: 202209131940
Unit Type and Rh: 7300
Unit Type and Rh: 9500
Unit Type and Rh: 9500

## 2021-07-04 NOTE — Telephone Encounter (Signed)
Scheduled appointment per 09/15 los. Patient is aware. 

## 2021-07-04 NOTE — Telephone Encounter (Signed)
Transition Care Management Follow-up Telephone Call Date of discharge and from where: 07/03/2021  [facility] How have you been since you were released from the hospital? Feeling better Any questions or concerns? No  Items Reviewed: Did the pt receive and understand the discharge instructions provided? Yes  Medications obtained and verified? Yes   report has 2 pickup from pharmacy Other? No  Any new allergies since your discharge? No  Dietary orders reviewed? Yes Do you have support at home? Yes   Home Care and Equipment/Supplies: Were home health services ordered? Yes  to be continued by [facility] If so, what is the name of the agency?  Has the agency set up a time to come to the patient's home? Previously enrolled with same services Were any new equipment or medical supplies ordered?  No What is the name of the medical supply agency?  Were you able to get the supplies/equipment? not applicable Do you have any questions related to the use of the equipment or supplies? No  Functional Questionnaire: (I = Independent and D = Dependent) ADLs: I  Bathing/Dressing- I  Meal Prep- I    Eating- I  Maintaining continence- I  Transferring/Ambulation- I  Managing Meds- I  Follow up appointments: PCP Hospital f/u appt confirmed? No   Specialist Hospital f/u appt confirmed? Yes  Scheduled to see Onocologist within 1 week per patient  Are transportation arrangements needed? No  If their condition worsens, is the pt aware to call PCP or go to the Emergency Dept.? Yes Was the patient provided with contact information for the PCP's office or ED? Yes Was to pt encouraged to call back with questions or concerns? Yes  Tomasa Rand, RN, BSN, CEN [facility] [employer] [phone]

## 2021-07-07 ENCOUNTER — Other Ambulatory Visit: Payer: Self-pay | Admitting: Oncology

## 2021-07-08 ENCOUNTER — Inpatient Hospital Stay: Payer: No Typology Code available for payment source | Admitting: Adult Health

## 2021-07-08 ENCOUNTER — Inpatient Hospital Stay: Payer: No Typology Code available for payment source

## 2021-07-08 ENCOUNTER — Telehealth: Payer: Self-pay | Admitting: *Deleted
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
## 2021-07-08 NOTE — Telephone Encounter (Signed)
This RN spoke with Malachy Mood per her call while she is inpt at [facility].  Shresta stated " they are telling me the capecitabine is not working and that is different then what Dr Jana Hakim told me last time "  This RN validated her concern - and reviewed with her at last MD appt in August with Dr Jana Hakim - he noted she was tolerating the therapy well but at that point she had not been restaged like she has been with recent admission.  This RN informed her the attending MD at [facility] has been in contact with Dr Jana Hakim and they have been in discussion of her care with primary concern presently is not just the cancer - but her other medical issues including the lupus, kidney failure and cardiac diagnoses.  " Yes I have a lot to think about "  This RN informed her goal presently per communication with [facility] attending- is to keep her as strong as possible with out use of chemotherapy that may be causing issues with her other diagnosis.  This RN was able to discuss seriousness of above with her with validation of her with this office wanting her to feel supported in her decisions.  Lora states " yes- and thank you " " I am praying about this "  This RN again verbally supported her - and offered to have her call again if she has concerns- as well as once discharged we want to see her to discuss her decisions and how we can assist her to keep her as healthy as possible.  No further questions at end of the conversation.

## 2021-07-09 ENCOUNTER — Other Ambulatory Visit: Payer: Self-pay | Admitting: Oncology

## 2021-07-10 ENCOUNTER — Telehealth: Payer: Self-pay | Admitting: Family Medicine

## 2021-07-10 NOTE — Telephone Encounter (Signed)
Noted! Thank you

## 2021-07-10 NOTE — Telephone Encounter (Signed)
Tommy of [facility] called to let Dr Darnell Level know that pt  missed her physical therapy visit on 07/01/21

## 2021-07-11 ENCOUNTER — Telehealth: Payer: Self-pay | Admitting: *Deleted

## 2021-07-11 NOTE — Telephone Encounter (Signed)
This RN was contacted by [facility] stating pt is being discharged today and they want her to have labs early next week.  This RN scheduled lab and visit for post hospital follow up

## 2021-07-15 ENCOUNTER — Encounter: Payer: Self-pay | Admitting: Adult Health

## 2021-07-15 ENCOUNTER — Inpatient Hospital Stay (HOSPITAL_BASED_OUTPATIENT_CLINIC_OR_DEPARTMENT_OTHER): Payer: No Typology Code available for payment source | Admitting: Adult Health

## 2021-07-15 ENCOUNTER — Other Ambulatory Visit: Payer: Self-pay

## 2021-07-15 ENCOUNTER — Inpatient Hospital Stay: Payer: No Typology Code available for payment source

## 2021-07-15 VITALS — BP 118/76 | HR 119 | Temp 97.5°F | Resp 18

## 2021-07-15 DIAGNOSIS — I130 Hypertensive heart and chronic kidney disease with heart failure and stage 1 through stage 4 chronic kidney disease, or unspecified chronic kidney disease: Secondary | ICD-10-CM | POA: Diagnosis not present

## 2021-07-15 DIAGNOSIS — C7951 Secondary malignant neoplasm of bone: Secondary | ICD-10-CM | POA: Diagnosis not present

## 2021-07-15 DIAGNOSIS — M329 Systemic lupus erythematosus, unspecified: Secondary | ICD-10-CM

## 2021-07-15 DIAGNOSIS — C50919 Malignant neoplasm of unspecified site of unspecified female breast: Secondary | ICD-10-CM | POA: Diagnosis not present

## 2021-07-15 DIAGNOSIS — C771 Secondary and unspecified malignant neoplasm of intrathoracic lymph nodes: Secondary | ICD-10-CM

## 2021-07-15 DIAGNOSIS — Z170 Estrogen receptor positive status [ER+]: Secondary | ICD-10-CM

## 2021-07-15 DIAGNOSIS — D561 Beta thalassemia: Secondary | ICD-10-CM

## 2021-07-15 DIAGNOSIS — M3214 Glomerular disease in systemic lupus erythematosus: Secondary | ICD-10-CM

## 2021-07-15 DIAGNOSIS — D61818 Other pancytopenia: Secondary | ICD-10-CM

## 2021-07-15 DIAGNOSIS — C50512 Malignant neoplasm of lower-outer quadrant of left female breast: Secondary | ICD-10-CM

## 2021-07-15 DIAGNOSIS — I700 Atherosclerosis of aorta: Secondary | ICD-10-CM

## 2021-07-15 LAB — CBC WITH DIFFERENTIAL/PLATELET
Abs Immature Granulocytes: 0.08 10*3/uL — ABNORMAL HIGH (ref 0.00–0.07)
Basophils Absolute: 0 10*3/uL (ref 0.0–0.1)
Basophils Relative: 0 %
Eosinophils Absolute: 0 10*3/uL (ref 0.0–0.5)
Eosinophils Relative: 0 %
HCT: 23 % — ABNORMAL LOW (ref 36.0–46.0)
Hemoglobin: 8 g/dL — ABNORMAL LOW (ref 12.0–15.0)
Immature Granulocytes: 1 %
Lymphocytes Relative: 11 %
Lymphs Abs: 0.6 10*3/uL — ABNORMAL LOW (ref 0.7–4.0)
MCH: 29.1 pg (ref 26.0–34.0)
MCHC: 34.8 g/dL (ref 30.0–36.0)
MCV: 83.6 fL (ref 80.0–100.0)
Monocytes Absolute: 0.4 10*3/uL (ref 0.1–1.0)
Monocytes Relative: 6 %
Neutro Abs: 4.6 10*3/uL (ref 1.7–7.7)
Neutrophils Relative %: 82 %
Platelets: 21 10*3/uL — ABNORMAL LOW (ref 150–400)
RBC: 2.75 MIL/uL — ABNORMAL LOW (ref 3.87–5.11)
RDW: 18.4 % — ABNORMAL HIGH (ref 11.5–15.5)
WBC: 5.7 10*3/uL (ref 4.0–10.5)
nRBC: 0.5 % — ABNORMAL HIGH (ref 0.0–0.2)

## 2021-07-15 LAB — COMPREHENSIVE METABOLIC PANEL
ALT: 48 U/L — ABNORMAL HIGH (ref 0–44)
AST: 72 U/L — ABNORMAL HIGH (ref 15–41)
Albumin: 3.1 g/dL — ABNORMAL LOW (ref 3.5–5.0)
Alkaline Phosphatase: 201 U/L — ABNORMAL HIGH (ref 38–126)
Anion gap: 15 (ref 5–15)
BUN: 82 mg/dL — ABNORMAL HIGH (ref 6–20)
CO2: 12 mmol/L — ABNORMAL LOW (ref 22–32)
Calcium: 9.2 mg/dL (ref 8.9–10.3)
Chloride: 112 mmol/L — ABNORMAL HIGH (ref 98–111)
Creatinine, Ser: 2.07 mg/dL — ABNORMAL HIGH (ref 0.44–1.00)
GFR, Estimated: 28 mL/min — ABNORMAL LOW (ref >60)
Glucose, Bld: 117 mg/dL — ABNORMAL HIGH (ref 70–99)
Potassium: 4.4 mmol/L (ref 3.5–5.1)
Sodium: 139 mmol/L (ref 135–145)
Total Bilirubin: 1.1 mg/dL (ref 0.3–1.2)
Total Protein: 6.2 g/dL — ABNORMAL LOW (ref 6.5–8.1)

## 2021-07-15 LAB — IRON AND TIBC
Iron: 199 ug/dL — ABNORMAL HIGH (ref 41–142)
Saturation Ratios: 106 % — ABNORMAL HIGH (ref 21–57)
TIBC: 189 ug/dL — ABNORMAL LOW (ref 236–444)
UIBC: UNDETERMINED ug/dL (ref 120–384)

## 2021-07-15 LAB — FERRITIN: Ferritin: 11314 ng/mL — ABNORMAL HIGH (ref 11–307)

## 2021-07-15 LAB — RETICULOCYTES
Immature Retic Fract: 10.6 % (ref 2.3–15.9)
RBC.: 2.71 MIL/uL — ABNORMAL LOW (ref 3.87–5.11)
Retic Count, Absolute: 20.6 10*3/uL (ref 19.0–186.0)
Retic Ct Pct: 0.8 % (ref 0.4–3.1)

## 2021-07-15 LAB — SAMPLE TO BLOOD BANK

## 2021-07-15 MED ORDER — TRAMADOL HCL 50 MG PO TABS: 50.0000 mg | ORAL_TABLET | Freq: Four times a day (QID) | ORAL | 0 refills | Status: AC | PRN
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
## 2021-07-17 ENCOUNTER — Other Ambulatory Visit: Payer: Self-pay | Admitting: *Deleted

## 2021-07-17 DIAGNOSIS — N184 Chronic kidney disease, stage 4 (severe): Secondary | ICD-10-CM

## 2021-07-17 DIAGNOSIS — D72819 Decreased white blood cell count, unspecified: Secondary | ICD-10-CM

## 2021-07-17 DIAGNOSIS — L930 Discoid lupus erythematosus: Secondary | ICD-10-CM

## 2021-07-17 DIAGNOSIS — C50919 Malignant neoplasm of unspecified site of unspecified female breast: Secondary | ICD-10-CM

## 2021-07-17 DIAGNOSIS — D631 Anemia in chronic kidney disease: Secondary | ICD-10-CM

## 2021-07-17 DIAGNOSIS — M3214 Glomerular disease in systemic lupus erythematosus: Secondary | ICD-10-CM

## 2021-07-17 DIAGNOSIS — D649 Anemia, unspecified: Secondary | ICD-10-CM

## 2021-07-17 NOTE — Progress Notes (Signed)
Per visit 07/15/2021 MD request for pt to be referred for full hospice services.   This RN placed referral and call [facility] with referral.

## 2021-07-18 DIAGNOSIS — I130 Hypertensive heart and chronic kidney disease with heart failure and stage 1 through stage 4 chronic kidney disease, or unspecified chronic kidney disease: Secondary | ICD-10-CM | POA: Diagnosis not present

## 2021-07-20 ENCOUNTER — Other Ambulatory Visit: Payer: Self-pay

## 2021-07-20 ENCOUNTER — Emergency Department
Admission: EM | Admit: 2021-07-20 | Discharge: 2021-08-19 | Disposition: E | Payer: No Typology Code available for payment source | Attending: Emergency Medicine | Admitting: Emergency Medicine

## 2021-07-20 ENCOUNTER — Encounter: Payer: Self-pay | Admitting: Emergency Medicine

## 2021-07-20 DIAGNOSIS — I5042 Chronic combined systolic (congestive) and diastolic (congestive) heart failure: Secondary | ICD-10-CM | POA: Insufficient documentation

## 2021-07-20 DIAGNOSIS — N184 Chronic kidney disease, stage 4 (severe): Secondary | ICD-10-CM | POA: Insufficient documentation

## 2021-07-20 DIAGNOSIS — I469 Cardiac arrest, cause unspecified: Secondary | ICD-10-CM | POA: Insufficient documentation

## 2021-07-20 DIAGNOSIS — I130 Hypertensive heart and chronic kidney disease with heart failure and stage 1 through stage 4 chronic kidney disease, or unspecified chronic kidney disease: Secondary | ICD-10-CM | POA: Diagnosis not present

## 2021-07-20 DIAGNOSIS — Z79899 Other long term (current) drug therapy: Secondary | ICD-10-CM | POA: Diagnosis not present

## 2021-07-20 DIAGNOSIS — Z8616 Personal history of COVID-19: Secondary | ICD-10-CM | POA: Insufficient documentation

## 2021-07-20 DIAGNOSIS — Z853 Personal history of malignant neoplasm of breast: Secondary | ICD-10-CM | POA: Insufficient documentation

## 2021-07-20 MED ORDER — EPINEPHRINE 1 MG/10ML IJ SOSY
PREFILLED_SYRINGE | INTRAMUSCULAR | Status: AC | PRN
  Administered 2021-07-20: 1 mg via INTRAVENOUS

## 2021-07-21 ENCOUNTER — Ambulatory Visit: Payer: No Typology Code available for payment source | Admitting: Family Medicine

## 2021-07-22 ENCOUNTER — Telehealth: Payer: Self-pay | Admitting: Family Medicine
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
## 2021-07-22 NOTE — Telephone Encounter (Signed)
So sorry to hear this.  Thank you for letting me know.

## 2021-07-23 ENCOUNTER — Other Ambulatory Visit: Payer: Self-pay | Admitting: Oncology

## 2021-07-28 LAB — BLOOD GAS, VENOUS
Acid-base deficit: 12.3 mmol/L — ABNORMAL HIGH (ref 0.0–2.0)
Bicarbonate: 13.2 mmol/L — ABNORMAL LOW (ref 20.0–28.0)
O2 Saturation: 49.9 %
Patient temperature: 37
pCO2, Ven: 28 mmHg — ABNORMAL LOW (ref 44.0–60.0)
pH, Ven: 7.28 (ref 7.250–7.430)

## 2021-08-12 NOTE — Telephone Encounter (Signed)
No entry 

## 2021-08-19 NOTE — Progress Notes (Signed)
Called to provide support to family after emergency traffic. Patient is deceased, doctor informed family. I escorted them bedside. Son especially was extremely emotional upon entering room. I stepped away to allow them private time.

## 2021-08-19 NOTE — ED Notes (Signed)
Request made for transport  

## 2021-08-19 NOTE — ED Triage Notes (Addendum)
Pt to ED via [facility] from home with CPR in Progress. Pt had a witnessed cardiac arrest by family. Per EMS pt had been speaking with family prior pt arrest. CPR instructions were given to family. EMS reports pt was in asystole when they arrive. CPR was initiated by EMS. EMS reports pt has been in asystole and PEA. Pt was given 3 doses of epi, 50 mcg of sodium bicard, 1 g of calcium, and 200 cc IVF via IO.   Pt arrived with CPR in progress via LUCAS device. Pt had king airway in place and ventilations were assisted by BVM. EMS reports pt recently diagnosed with stage 4 lung cancer, pt has not had chemo. Pt is not a DNR.

## 2021-08-19 NOTE — Code Documentation (Signed)
Patient time of death occurred at 29 .
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
## 2021-08-19 DEATH — deceased

## 2021-09-17 ENCOUNTER — Encounter: Payer: No Typology Code available for payment source | Admitting: Family Medicine
